# Patient Record
Sex: Female | Born: 1965 | Race: White | Hispanic: No | Marital: Single | Smoking: Never smoker
Health system: Southern US, Community
[De-identification: ages and names within clinical notes are randomized; demographics above are authoritative.]

## PROBLEM LIST (undated history)

## (undated) DIAGNOSIS — F32A Depression, unspecified: Secondary | ICD-10-CM

## (undated) DIAGNOSIS — Z9989 Dependence on other enabling machines and devices: Secondary | ICD-10-CM

## (undated) DIAGNOSIS — Z87442 Personal history of urinary calculi: Secondary | ICD-10-CM

## (undated) DIAGNOSIS — J189 Pneumonia, unspecified organism: Secondary | ICD-10-CM

## (undated) DIAGNOSIS — I639 Cerebral infarction, unspecified: Secondary | ICD-10-CM

## (undated) DIAGNOSIS — I10 Essential (primary) hypertension: Secondary | ICD-10-CM

## (undated) DIAGNOSIS — Z973 Presence of spectacles and contact lenses: Secondary | ICD-10-CM

## (undated) DIAGNOSIS — E119 Type 2 diabetes mellitus without complications: Secondary | ICD-10-CM

## (undated) HISTORY — PX: ABDOMINAL HYSTERECTOMY: SHX81

## (undated) HISTORY — PX: HERNIA REPAIR: SHX51

---

## 2011-07-25 DIAGNOSIS — C569 Malignant neoplasm of unspecified ovary: Secondary | ICD-10-CM

## 2011-07-25 HISTORY — DX: Malignant neoplasm of unspecified ovary: C56.9

## 2021-08-09 ENCOUNTER — Encounter: Payer: Self-pay | Admitting: *Deleted

## 2021-08-09 NOTE — Progress Notes (Signed)
Received a referral from PCP in the area for this patient needing to transfer care for Ovarian Cancer. The referring office has no oncology medical records.   Per PCP the patient was seen at [facility] in [place]. Called [phone] and left message requesting medical records. They called back, but transferred me to another department where I had to leave another message. Awaiting call back so that I can schedule patient.   Oncology Nurse Navigator Documentation  Oncology Nurse Navigator Flowsheets 08/09/2021  Navigator Follow Up Date: 08/10/2021  Navigator Follow Up Reason: Other:  Navigator Location [facility]  Referral Date to RadOnc/MedOnc 08/09/2021  Navigator Encounter Type Telephone  Patient Visit Type MedOnc  Treatment Phase Active Tx  Barriers/Navigation Needs Coordination of Care  Interventions Coordination of Care  Acuity Level 2-Minimal Needs (1-2 Barriers Identified)  Coordination of Care Other  Time Spent with Patient 45

## 2021-08-10 ENCOUNTER — Encounter: Payer: Self-pay | Admitting: *Deleted

## 2021-08-10 NOTE — Progress Notes (Signed)
Called patient and left a message requesting a call back. We still need medical records and I have not made any progress with attempting this myself.   Patient returned call and she will attempt to get medical records.  Fax number given.  Oncology Nurse Navigator Documentation  Oncology Nurse Navigator Flowsheets 08/10/2021  Navigator Follow Up Date: -  Navigator Follow Up Reason: -  Navigator Location [facility]  Referral Date to RadOnc/MedOnc -  Navigator Encounter Type Telephone  Telephone Outgoing Call  Patient Visit Type MedOnc  Treatment Phase Active Tx  Barriers/Navigation Needs Coordination of Care  Interventions -  Acuity -  Coordination of Care -  Time Spent with Patient 15

## 2021-08-12 ENCOUNTER — Encounter: Payer: Self-pay | Admitting: *Deleted

## 2021-08-12 NOTE — Progress Notes (Signed)
Reached out to Orpah Greek to introduce myself as the office RN Navigator and explain our new patient process. Reviewed the reason for their referral and scheduled their new patient appointment along with labs. Provided address and directions to the office including call back phone number. Reviewed with patient any concerns they may have or any possible barriers to attending their appointment.   Informed patient about my role as a navigator and that I will meet with them prior to their New Patient appointment and more fully discuss what services I can provide. At this time patient has no further questions or needs.    Oncology Nurse Navigator Documentation  Oncology Nurse Navigator Flowsheets 08/12/2021  Navigator Follow Up Date: 08/18/2021  Navigator Follow Up Reason: New Patient Appointment  Navigator Location [facility]  Referral Date to RadOnc/MedOnc -  Navigator Encounter Type Introductory Phone Call  Telephone -  Patient Visit Type MedOnc  Treatment Phase Active Tx  Barriers/Navigation Needs Coordination of Care  Interventions Coordination of Care;Education  Acuity Level 2-Minimal Needs (1-2 Barriers Identified)  Coordination of Care Appts  Education Method Verbal  Time Spent with Patient 45

## 2021-08-18 ENCOUNTER — Inpatient Hospital Stay (HOSPITAL_BASED_OUTPATIENT_CLINIC_OR_DEPARTMENT_OTHER): Payer: BC Managed Care – PPO | Admitting: Hematology & Oncology

## 2021-08-18 ENCOUNTER — Inpatient Hospital Stay: Payer: BC Managed Care – PPO

## 2021-08-18 ENCOUNTER — Telehealth: Payer: Self-pay | Admitting: *Deleted

## 2021-08-18 ENCOUNTER — Other Ambulatory Visit: Payer: Self-pay

## 2021-08-18 ENCOUNTER — Inpatient Hospital Stay: Payer: BC Managed Care – PPO | Attending: Hematology & Oncology

## 2021-08-18 ENCOUNTER — Encounter: Payer: Self-pay | Admitting: *Deleted

## 2021-08-18 ENCOUNTER — Encounter: Payer: Self-pay | Admitting: Hematology & Oncology

## 2021-08-18 VITALS — BP 156/83 | HR 83 | Temp 98.4°F | Resp 17 | Wt 146.0 lb

## 2021-08-18 DIAGNOSIS — C541 Malignant neoplasm of endometrium: Secondary | ICD-10-CM

## 2021-08-18 DIAGNOSIS — T451X5A Adverse effect of antineoplastic and immunosuppressive drugs, initial encounter: Secondary | ICD-10-CM | POA: Insufficient documentation

## 2021-08-18 DIAGNOSIS — I82501 Chronic embolism and thrombosis of unspecified deep veins of right lower extremity: Secondary | ICD-10-CM | POA: Insufficient documentation

## 2021-08-18 DIAGNOSIS — Z7901 Long term (current) use of anticoagulants: Secondary | ICD-10-CM | POA: Insufficient documentation

## 2021-08-18 DIAGNOSIS — D61810 Antineoplastic chemotherapy induced pancytopenia: Secondary | ICD-10-CM | POA: Diagnosis not present

## 2021-08-18 DIAGNOSIS — Z86718 Personal history of other venous thrombosis and embolism: Secondary | ICD-10-CM | POA: Diagnosis not present

## 2021-08-18 DIAGNOSIS — D631 Anemia in chronic kidney disease: Secondary | ICD-10-CM | POA: Insufficient documentation

## 2021-08-18 DIAGNOSIS — N19 Unspecified kidney failure: Secondary | ICD-10-CM | POA: Insufficient documentation

## 2021-08-18 DIAGNOSIS — C530 Malignant neoplasm of endocervix: Secondary | ICD-10-CM

## 2021-08-18 DIAGNOSIS — Z79811 Long term (current) use of aromatase inhibitors: Secondary | ICD-10-CM | POA: Insufficient documentation

## 2021-08-18 DIAGNOSIS — N189 Chronic kidney disease, unspecified: Secondary | ICD-10-CM

## 2021-08-18 DIAGNOSIS — Z79899 Other long term (current) drug therapy: Secondary | ICD-10-CM | POA: Diagnosis not present

## 2021-08-18 DIAGNOSIS — Z923 Personal history of irradiation: Secondary | ICD-10-CM | POA: Insufficient documentation

## 2021-08-18 DIAGNOSIS — Z7189 Other specified counseling: Secondary | ICD-10-CM

## 2021-08-18 HISTORY — DX: Malignant neoplasm of endometrium: C54.1

## 2021-08-18 HISTORY — DX: Chronic embolism and thrombosis of unspecified deep veins of right lower extremity: I82.501

## 2021-08-18 HISTORY — DX: Chronic kidney disease, unspecified: N18.9

## 2021-08-18 HISTORY — DX: Other specified counseling: Z71.89

## 2021-08-18 LAB — CBC WITH DIFFERENTIAL (CANCER CENTER ONLY)
Abs Immature Granulocytes: 0.03 10*3/uL (ref 0.00–0.07)
Basophils Absolute: 0 10*3/uL (ref 0.0–0.1)
Basophils Relative: 0 %
Eosinophils Absolute: 0.2 10*3/uL (ref 0.0–0.5)
Eosinophils Relative: 4 %
HCT: 22 % — ABNORMAL LOW (ref 36.0–46.0)
Hemoglobin: 6.9 g/dL — CL (ref 12.0–15.0)
Immature Granulocytes: 1 %
Lymphocytes Relative: 15 %
Lymphs Abs: 0.7 10*3/uL (ref 0.7–4.0)
MCH: 27.9 pg (ref 26.0–34.0)
MCHC: 31.4 g/dL (ref 30.0–36.0)
MCV: 89.1 fL (ref 80.0–100.0)
Monocytes Absolute: 0.3 10*3/uL (ref 0.1–1.0)
Monocytes Relative: 7 %
Neutro Abs: 3.3 10*3/uL (ref 1.7–7.7)
Neutrophils Relative %: 73 %
Platelet Count: 183 10*3/uL (ref 150–400)
RBC: 2.47 MIL/uL — ABNORMAL LOW (ref 3.87–5.11)
RDW: 17.4 % — ABNORMAL HIGH (ref 11.5–15.5)
WBC Count: 4.5 10*3/uL (ref 4.0–10.5)
nRBC: 0 % (ref 0.0–0.2)

## 2021-08-18 LAB — CMP (CANCER CENTER ONLY)
ALT: <5 U/L (ref 0–44)
AST: 7 U/L — ABNORMAL LOW (ref 15–41)
Albumin: 4 g/dL (ref 3.5–5.0)
Alkaline Phosphatase: 61 U/L (ref 38–126)
Anion gap: 14 (ref 5–15)
BUN: 83 mg/dL — ABNORMAL HIGH (ref 6–20)
CO2: 18 mmol/L — ABNORMAL LOW (ref 22–32)
Calcium: 10 mg/dL (ref 8.9–10.3)
Chloride: 107 mmol/L (ref 98–111)
Creatinine: 5.75 mg/dL (ref 0.44–1.00)
GFR, Estimated: 8 mL/min — ABNORMAL LOW (ref >60)
Glucose, Bld: 171 mg/dL — ABNORMAL HIGH (ref 70–99)
Potassium: 4.4 mmol/L (ref 3.5–5.1)
Sodium: 139 mmol/L (ref 135–145)
Total Bilirubin: 0.3 mg/dL (ref 0.3–1.2)
Total Protein: 7.8 g/dL (ref 6.5–8.1)

## 2021-08-18 LAB — ABO/RH: ABO/RH(D): A POS

## 2021-08-18 LAB — PREPARE RBC (CROSSMATCH)

## 2021-08-18 LAB — SAMPLE TO BLOOD BANK

## 2021-08-18 LAB — PREALBUMIN: Prealbumin: 19.1 mg/dL (ref 18–38)

## 2021-08-18 NOTE — Progress Notes (Signed)
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  HPI: Jaime Gomez is an incredibly nice 56 year old white female.  She comes in with her sister.  She is originally from [facility]  She has lived there all of her life outside of a few years when she moved to [place].  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  It looks like she may have been diagnosed with ovarian/uterine cancer back in 2012.  She underwent neoadjuvant chemotherapy with Taxol.  She had a reaction to the Taxol and then was switched over to Doxil/carboplatinum.  She underwent total hysterectomy with lymph node dissection.  She received some postop radiation to the uterus and para-aortic region.  Unfortunately, she had a quick recurrence.  This was also in 2012.  She was treated with gemcitabine/carboplatinum.  She actually received this protocol for about 4 years.  She was then placed on 2 Avastin in June 2018.  She was then treated with Alimta.  This was discontinued after 2 cycles because of severe cytopenia.  Para she was then given a trial of Lynparza.  Again she had problems with nausea and pancytopenia.  In August 2019, she was then treated with Keytruda.  She received 7 cycles and then was found to have progressive disease.  She then was treated with topotecan.  She received topotecan starting in December 2019.  She got 5 cycles.  Then, she received Piqray.  She received this until December 2020.  She had severe hyperglycemia.  She subsequently stopped therapy.  She then was treated again with carboplatinum.  She had progressive disease that was noted in April 2021.  Her CA-125 increased.  She then had some thoracic radiation therapy.  She was then placed on Abraxane.  She had 4 cycles that was completed on October 2021.  She was subsequently has been placed on  Afinitor.  She also had letrozole added.  She had a response with her CA-125 going down.  She I think her last scans back in the October.  She subsequently has moved to [facility].  She has had some mild renal insufficiency in the past.  This clearly is worse now.  Today, her hemoglobin is 6.9.  Her creatinine is 5.9.  Her family doctor in [place] is trying to find her a nephrologist.  I think that she is going to see a urologist.  She actually looks a lot better than I would have thought.  Overall, she is lost about 100 pounds in about a year or so.  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  She has a blood clot in her right leg.  She is on Eliquis for this.  She did have a IVC filter placed.  She is having issues with pain.  She is only on oxycodone.  I think she is  Need something that is time-released.  We will see about getting her on a Duragesic patch.  She seems to be tolerating the Afinitor/letrozole fairly well.  [name] we are going to ve to do our set of scans to see exactly what is going on.  She seems to be eating okay.  There is no nausea or vomiting.  She has had no cough or shortness of breath.  There is no bleeding.  She is still making urine.  She is having no problems with diarrhea.  She does not smoke.  She does not drink.  Overall, I would have to say that her performance status is probably ECOG 1-2.  No past medical history on file.:   Current Outpatient Medications:    apixaban (ELIQUIS) 2.5 MG TABS tablet, Take 2.5 mg by mouth 2 (two) times daily., Disp: , Rfl:    atorvastatin (LIPITOR) 10 MG tablet, Take 10 mg by mouth daily at 6 (six) AM., Disp: , Rfl:    carvedilol (COREG) 25 MG tablet, Take 25 mg by mouth daily at 6 (six) AM., Disp: , Rfl:    everolimus (AFINITOR) 10 MG tablet, Take 10 mg by mouth daily at 6 (six) AM., Disp: , Rfl:    folic acid (FOLVITE) 1 MG tablet, Take 1 mg by mouth daily at 6 (six) AM., Disp:  , Rfl:    gabapentin (NEURONTIN) 100 MG capsule, Take 100 mg by mouth 2 (two) times daily., Disp: , Rfl:    insulin aspart protamine - aspart (NOVOLOG MIX 70/30 FLEXPEN) (70-30) 100 UNIT/ML FlexPen, Inject 20 Units into the skin as directed. Per meal, Disp: , Rfl:    insulin detemir (LEVEMIR) 100 UNIT/ML injection, Inject 20 Units into the skin 2 (two) times daily., Disp: , Rfl:    letrozole (FEMARA) 2.5 MG tablet, Take 2.5 mg by mouth daily at 6 (six) AM., Disp: , Rfl:    nitrofurantoin, macrocrystal-monohydrate, (MACROBID) 100 MG capsule, Take 100 mg by mouth 2 (two) times daily., Disp: , Rfl:    ondansetron (ZOFRAN) 8 MG tablet, Take 8 mg by mouth every 8 (eight) hours as needed., Disp: , Rfl:    oxycodone (OXY-IR) 5 MG capsule, Take 5 mg by mouth every 4 (four) hours as needed., Disp: , Rfl:    pantoprazole (PROTONIX) 40 MG tablet, Take 40 mg by mouth in the morning., Disp: , Rfl:    pravastatin (PRAVACHOL) 80 MG tablet, Take 80 mg by mouth daily at 6 (six) AM., Disp: , Rfl:    Insulin Aspart (NOVOLOG FLEXPEN [place]), Inject 20 Units into the skin as directed. Per meal (Patient not taking: Reported on 08/18/2021), Disp: , Rfl: :  :   Allergies  Allergen Reactions   Taxotere [Docetaxel]     Throat swoll  :  No family history on file.:   Social History   Socioeconomic History   Marital status: Single    Spouse name: Not on file   Number of children: Not on file   Years of education: Not on file   Highest education level: Not on file  Occupational History   Not on file  Tobacco Use   Smoking status: Never   Smokeless tobacco: Never  Substance and Sexual Activity   Alcohol use: Not on file   Drug use: Not on file   Sexual activity: Not on file  Other Topics Concern   Not on file  Social History Narrative   Not on file   Social Determinants of Health   Financial Resource Strain: Not on file  Food Insecurity: Not on file  Transportation Needs: Not on file  Physical  Activity: Not on file  Stress: Not on file  Social Connections: Not on file  Intimate Partner Violence: Not on file  :  Review of Systems  Constitutional:  Positive for malaise/fatigue and weight loss.  HENT: Negative.    Eyes: Negative.   Respiratory:  Positive for shortness of breath.   Cardiovascular:  Positive  for palpitations and leg swelling.  Gastrointestinal:  Positive for constipation and nausea.  Genitourinary:  Positive for dysuria.  Musculoskeletal:  Positive for back pain, joint pain and myalgias.  Skin: Negative.   Neurological:  Positive for focal weakness.  Endo/Heme/Allergies: Negative.   Psychiatric/Behavioral: Negative.      Exam: @IPVITALS @ Physical Exam Vitals reviewed.  HENT:     Head: Normocephalic and atraumatic.  Eyes:     Pupils: Pupils are equal, round, and reactive to light.  Cardiovascular:     Rate and Rhythm: Normal rate and regular rhythm.     Heart sounds: Normal heart sounds.  Pulmonary:     Effort: Pulmonary effort is normal.     Breath sounds: Normal breath sounds.  Abdominal:     General: Bowel sounds are normal.     Palpations: Abdomen is soft.  Musculoskeletal:        General: No tenderness or deformity. Normal range of motion.     Cervical back: Normal range of motion.  Lymphadenopathy:     Cervical: No cervical adenopathy.  Skin:    General: Skin is warm and dry.     Findings: No erythema or rash.  Neurological:     Mental Status: She is alert and oriented to person, place, and time.  Psychiatric:        Behavior: Behavior normal.        Thought Content: Thought content normal.        Judgment: Judgment normal.      Recent Labs    08/18/21 1046  WBC 4.5  HGB 6.9*  HCT 22.0*  PLT 183    Recent Labs    08/18/21 1046  NA 139  K 4.4  CL 107  CO2 18*  GLUCOSE 171*  BUN 83*  CREATININE 5.75*  CALCIUM 10.0    Blood smear review: None  Pathology: None    Assessment and Plan: Ms. Cronk is a very  charming 56 year old white female.  She is from [facility].  She just moved here from [facility].  She has been here about 4 weeks.  We clearly have a lot going on.  The main problem right now is her renal failure.  She is going to have to see nephrology.  She is still making urine.  She is not hyperkalemic.  As such, I would like to hope that she will not need dialysis.  However, this is certainly a possibility.  She probably needs to have ultrasound of her kidneys done to see if she does have hydronephrosis.  She has had stents in the past.  There apparently were taken out because of issues with leakage and pain.  I think she sees a urologist next week.  She is clearly going need to have a transfusion.  I suspect her erythropoietin levels can be incredibly low.  I hate to say it but we probably cannot give her ESA because of the thromboembolic disease.  She does have the blood clot in the right leg.  She has a filter in.  I have worried that the filter might be filling up with blood clot.  I do think we will have to get a Doppler of her right leg to see exactly what might be going on.  She is currently on the Afinitor/letrozole.  She does have a CT scan or PET scan to see exactly what is going on there.  It might be hard to make a good assessment since we do not  have the scans from [facility].  She has been on most every therapy that there is for ovarian/uterine cancer.  She just wants to make sure her quality of life is good.  Again we will see about putting her on a Duragesic patch to see if this may help and supplement the oxycodone that she is on.  I must say that this is very complicated.  She does look quite good despite all of the issues.  She obviously has very good help with her sister.  I must say that I had a wonderful time talking with she and her sister.  It was a whole lot of fun talking about [facility] and all the issues with [employer].  Again we will transfuse her tomorrow.  We  will try to get scans on her in the next week or so and then figure out how we need to follow along.  I really think that the most important aspect is going to be the kidney failure right now.

## 2021-08-18 NOTE — Progress Notes (Signed)
Initial RN Navigator Patient Visit  Name: Jaime Gomez Date of Referral : 08/09/2021 Diagnosis: previously diagnosed and treated ovarian cancer  Patient new to the area from [facility]. She is here to establish care.  Patient completed visit with Dr. Marin Olp.  She will need to come back tomorrow for transfusion.   Patient will need scans to restage. Will wait for insurance authorization and schedule these.   Patient understands all follow up procedures and expectations. They have my number to reach out for any further clarification or additional needs.    Oncology Nurse Navigator Documentation  Oncology Nurse Navigator Flowsheets 08/18/2021  Navigator Follow Up Date: 08/19/2021  Navigator Follow Up Reason: Symptom Management  Navigator Location [facility]  Referral Date to RadOnc/MedOnc -  Navigator Encounter Type Initial MedOnc  Telephone -  Patient Visit Type MedOnc  Treatment Phase Active Tx  Barriers/Navigation Needs Coordination of Care  Interventions Education  Acuity Level 2-Minimal Needs (1-2 Barriers Identified)  Coordination of Care -  Education Method -  Support Groups/Services Friends and Family  Time Spent with Patient 30

## 2021-08-18 NOTE — Telephone Encounter (Signed)
Dr. Marin Olp notified of creat-5.75.  No new orders received at this time.

## 2021-08-18 NOTE — Telephone Encounter (Signed)
Dr. Marin Olp notified of HGB-6.9.  No new orders received at this time.

## 2021-08-19 ENCOUNTER — Ambulatory Visit (HOSPITAL_BASED_OUTPATIENT_CLINIC_OR_DEPARTMENT_OTHER)
Admission: RE | Admit: 2021-08-19 | Discharge: 2021-08-19 | Disposition: A | Discharge status: Home / Self Care | Payer: BC Managed Care – PPO | Source: Ambulatory Visit | Attending: Hematology & Oncology | Admitting: Hematology & Oncology

## 2021-08-19 ENCOUNTER — Other Ambulatory Visit: Payer: BC Managed Care – PPO

## 2021-08-19 ENCOUNTER — Other Ambulatory Visit: Payer: Self-pay | Admitting: *Deleted

## 2021-08-19 ENCOUNTER — Encounter: Payer: Self-pay | Admitting: *Deleted

## 2021-08-19 ENCOUNTER — Inpatient Hospital Stay: Payer: BC Managed Care – PPO

## 2021-08-19 ENCOUNTER — Other Ambulatory Visit: Payer: Self-pay | Admitting: Hematology & Oncology

## 2021-08-19 DIAGNOSIS — C541 Malignant neoplasm of endometrium: Secondary | ICD-10-CM | POA: Insufficient documentation

## 2021-08-19 DIAGNOSIS — C530 Malignant neoplasm of endocervix: Secondary | ICD-10-CM | POA: Insufficient documentation

## 2021-08-19 DIAGNOSIS — N189 Chronic kidney disease, unspecified: Secondary | ICD-10-CM | POA: Insufficient documentation

## 2021-08-19 DIAGNOSIS — D631 Anemia in chronic kidney disease: Secondary | ICD-10-CM | POA: Diagnosis present

## 2021-08-19 DIAGNOSIS — Z95828 Presence of other vascular implants and grafts: Secondary | ICD-10-CM

## 2021-08-19 LAB — CA 125: Cancer Antigen (CA) 125: 172 U/mL — ABNORMAL HIGH (ref 0.0–38.1)

## 2021-08-19 MED ORDER — DIPHENHYDRAMINE HCL 25 MG PO CAPS
25.0000 mg | ORAL_CAPSULE | Freq: Once | ORAL | Status: AC
  Administered 2021-08-19: 25 mg via ORAL
  Filled 2021-08-19: qty 1

## 2021-08-19 MED ORDER — SODIUM CHLORIDE 0.9% IV SOLUTION
250.0000 mL | Freq: Once | INTRAVENOUS | Status: AC
  Administered 2021-08-19: 250 mL via INTRAVENOUS

## 2021-08-19 MED ORDER — ACETAMINOPHEN 325 MG PO TABS
650.0000 mg | ORAL_TABLET | Freq: Once | ORAL | Status: AC
  Administered 2021-08-19: 650 mg via ORAL
  Filled 2021-08-19: qty 2

## 2021-08-19 NOTE — Progress Notes (Signed)
Met with patient in infusion. Gave her my business card with my contact info. Also reviewed my role and encouraged her to reach out as needed. She has already been scheduled for her PET on 08/24/2021. ° °Her port has no blood return and a CXR  this morning showed the tip to be in the L brachycephalic vein near the junction with the SVC. Spoke to Dr Ennever about ordering a dye study to confirm patency and placement. He agreed and order placed. Spoke with patient about study. She is aware that IR will be calling to schedule appointment.  ° °Oncology Nurse Navigator Documentation ° °Oncology Nurse Navigator Flowsheets 08/19/2021  °Navigator Follow Up Date: 08/24/2021  °Navigator Follow Up Reason: Scan Review  °Navigator Location [facility]  °Referral Date to RadOnc/MedOnc -  °Navigator Encounter Type Treatment  °Telephone -  °Patient Visit Type MedOnc  °Treatment Phase Active Tx  °Barriers/Navigation Needs Coordination of Care  °Interventions Psycho-Social Support  °Acuity Level 2-Minimal Needs (1-2 Barriers Identified)  °Coordination of Care -  °Education Method Verbal  °Support Groups/Services Friends and Family  °Time Spent with Patient 30  °  °

## 2021-08-19 NOTE — Progress Notes (Signed)
Pt requesting to start PIV instead of using port. PIV started per pt request

## 2021-08-19 NOTE — Patient Instructions (Signed)
Blood Transfusion, Adult, Care After This sheet gives you information about how to care for yourself after your procedure. Your doctor may also give you more specific instructions. If you have problems or questions, contact your doctor. What can I expect after the procedure? After the procedure, it is common to have: Bruising and soreness at the IV site. A headache. Follow these instructions at home: Insertion site care   Follow instructions from your doctor about how to take care of your insertion site. This is where an IV tube was put into your vein. Make sure you: Wash your hands with soap and water before and after you change your bandage (dressing). If you cannot use soap and water, use hand sanitizer. Change your bandage as told by your doctor. Check your insertion site every day for signs of infection. Check for: Redness, swelling, or pain. Bleeding from the site. Warmth. Pus or a bad smell. General instructions Take over-the-counter and prescription medicines only as told by your doctor. Rest as told by your doctor. Go back to your normal activities as told by your doctor. Keep all follow-up visits as told by your doctor. This is important. Contact a doctor if: You have itching or red, swollen areas of skin (hives). You feel worried or nervous (anxious). You feel weak after doing your normal activities. You have redness, swelling, warmth, or pain around the insertion site. You have blood coming from the insertion site, and the blood does not stop with pressure. You have pus or a bad smell coming from the insertion site. Get help right away if: You have signs of a serious reaction. This may be coming from an allergy or the body's defense system (immune system). Signs include: Trouble breathing or shortness of breath. Swelling of the face or feeling warm (flushed). Fever or chills. Head, chest, or back pain. Dark pee (urine) or blood in the pee. Widespread rash. Fast  heartbeat. Feeling dizzy or light-headed. You may receive your blood transfusion in an outpatient setting. If so, you will be told whom to contact to report any reactions. These symptoms may be an emergency. Do not wait to see if the symptoms will go away. Get medical help right away. Call your local emergency services (911 in the [place] Do not drive yourself to the hospital. Summary Bruising and soreness at the IV site are common. Check your insertion site every day for signs of infection. Rest as told by your doctor. Go back to your normal activities as told by your doctor. Get help right away if you have signs of a serious reaction. This information is not intended to replace advice given to you by your health care provider. Make sure you discuss any questions you have with your health care provider. Document Revised: 11/04/2020 Document Reviewed: 01/02/2019 Elsevier Patient Education  2022 Elsevier Inc.  

## 2021-08-22 ENCOUNTER — Other Ambulatory Visit: Payer: Self-pay

## 2021-08-22 LAB — BPAM RBC
Blood Product Expiration Date: 202302182359
Blood Product Expiration Date: 202302182359
ISSUE DATE / TIME: 202301270827
ISSUE DATE / TIME: 202301270827
Unit Type and Rh: 600
Unit Type and Rh: 600

## 2021-08-22 LAB — TYPE AND SCREEN
ABO/RH(D): A POS
Antibody Screen: POSITIVE
DAT, IgG: NEGATIVE
Donor AG Type: NEGATIVE
Donor AG Type: NEGATIVE
PT AG Type: NEGATIVE
Unit division: 0
Unit division: 0

## 2021-08-22 MED ORDER — OXYCODONE HCL 5 MG PO CAPS: 5.0000 mg | ORAL_CAPSULE | ORAL | 0 refills | Status: DC | PRN

## 2021-08-23 ENCOUNTER — Other Ambulatory Visit: Payer: Self-pay | Admitting: Hematology & Oncology

## 2021-08-23 ENCOUNTER — Other Ambulatory Visit (HOSPITAL_COMMUNITY): Payer: Self-pay | Admitting: Interventional Radiology

## 2021-08-23 ENCOUNTER — Other Ambulatory Visit: Payer: Self-pay

## 2021-08-23 ENCOUNTER — Ambulatory Visit (HOSPITAL_COMMUNITY)
Admission: RE | Admit: 2021-08-23 | Discharge: 2021-08-23 | Disposition: A | Discharge status: Home / Self Care | Payer: BC Managed Care – PPO | Source: Ambulatory Visit | Attending: Hematology & Oncology | Admitting: Hematology & Oncology

## 2021-08-23 DIAGNOSIS — Z95828 Presence of other vascular implants and grafts: Secondary | ICD-10-CM

## 2021-08-23 DIAGNOSIS — C7801 Secondary malignant neoplasm of right lung: Secondary | ICD-10-CM | POA: Diagnosis not present

## 2021-08-23 DIAGNOSIS — C7802 Secondary malignant neoplasm of left lung: Secondary | ICD-10-CM | POA: Diagnosis not present

## 2021-08-23 DIAGNOSIS — Z452 Encounter for adjustment and management of vascular access device: Secondary | ICD-10-CM | POA: Diagnosis not present

## 2021-08-23 DIAGNOSIS — N1330 Unspecified hydronephrosis: Secondary | ICD-10-CM | POA: Diagnosis not present

## 2021-08-23 HISTORY — PX: IR CV LINE INJECTION: IMG2294

## 2021-08-23 MED ORDER — IOHEXOL 300 MG/ML  SOLN
10.0000 mL | Freq: Once | INTRAMUSCULAR | Status: AC | PRN
  Administered 2021-08-23: 10 mL via INTRAVENOUS

## 2021-08-24 ENCOUNTER — Ambulatory Visit (HOSPITAL_COMMUNITY)
Admission: RE | Admit: 2021-08-24 | Discharge: 2021-08-24 | Disposition: A | Discharge status: Home / Self Care | Payer: BC Managed Care – PPO | Source: Ambulatory Visit | Attending: Hematology & Oncology | Admitting: Hematology & Oncology

## 2021-08-24 DIAGNOSIS — Z452 Encounter for adjustment and management of vascular access device: Secondary | ICD-10-CM | POA: Diagnosis not present

## 2021-08-24 DIAGNOSIS — C530 Malignant neoplasm of endocervix: Secondary | ICD-10-CM

## 2021-08-24 LAB — GLUCOSE, CAPILLARY: Glucose-Capillary: 155 mg/dL — ABNORMAL HIGH (ref 70–99)

## 2021-08-24 MED ORDER — FLUDEOXYGLUCOSE F - 18 (FDG) INJECTION
6.9400 | Freq: Once | INTRAVENOUS | Status: AC | PRN
  Administered 2021-08-24: 6.94 via INTRAVENOUS

## 2021-08-25 ENCOUNTER — Encounter: Payer: Self-pay | Admitting: *Deleted

## 2021-08-25 NOTE — Progress Notes (Signed)
Reviewed PET scan results. Patient has an appointment to have port exchanged on 09/06/2021 but no follow up scheduled with [place]. Spoke to Dr Marin Olp and he wants to see her after her port revision.   Called patient and notified her that scheduling would be calling for follow up.   Oncology Nurse Navigator Documentation  Oncology Nurse Navigator Flowsheets 08/25/2021  Navigator Follow Up Date: 09/08/2021  Navigator Follow Up Reason: Follow-up Appointment  Navigator Location [facility]  Referral Date to RadOnc/MedOnc -  Navigator Encounter Type Scan Review  Telephone -  Patient Visit Type MedOnc  Treatment Phase Active Tx  Barriers/Navigation Needs Coordination of Care  Interventions Coordination of Care  Acuity Level 2-Minimal Needs (1-2 Barriers Identified)  Coordination of Care Other  Education Method -  Support Groups/Services Friends and Family  Time Spent with Patient 30

## 2021-08-27 ENCOUNTER — Telehealth (HOSPITAL_BASED_OUTPATIENT_CLINIC_OR_DEPARTMENT_OTHER): Payer: Self-pay

## 2021-09-01 ENCOUNTER — Encounter (HOSPITAL_COMMUNITY)
Admission: EM | Disposition: A | Discharge status: Home / Self Care | Payer: Self-pay | Source: Home / Self Care | Attending: Internal Medicine

## 2021-09-01 ENCOUNTER — Emergency Department (HOSPITAL_COMMUNITY): Payer: BC Managed Care – PPO

## 2021-09-01 ENCOUNTER — Emergency Department (HOSPITAL_COMMUNITY): Payer: BC Managed Care – PPO | Admitting: Anesthesiology

## 2021-09-01 ENCOUNTER — Emergency Department (HOSPITAL_BASED_OUTPATIENT_CLINIC_OR_DEPARTMENT_OTHER): Payer: BC Managed Care – PPO

## 2021-09-01 ENCOUNTER — Other Ambulatory Visit: Payer: Self-pay

## 2021-09-01 ENCOUNTER — Inpatient Hospital Stay: Admit: 2021-09-01 | Payer: BC Managed Care – PPO | Admitting: Urology

## 2021-09-01 ENCOUNTER — Inpatient Hospital Stay (HOSPITAL_BASED_OUTPATIENT_CLINIC_OR_DEPARTMENT_OTHER)
Admission: EM | Admit: 2021-09-01 | Discharge: 2021-09-05 | DRG: 660 | Disposition: A | Discharge status: Home / Self Care | Payer: BC Managed Care – PPO | Attending: Internal Medicine | Admitting: Internal Medicine

## 2021-09-01 ENCOUNTER — Encounter (HOSPITAL_BASED_OUTPATIENT_CLINIC_OR_DEPARTMENT_OTHER): Payer: Self-pay

## 2021-09-01 DIAGNOSIS — Z79811 Long term (current) use of aromatase inhibitors: Secondary | ICD-10-CM | POA: Diagnosis not present

## 2021-09-01 DIAGNOSIS — E119 Type 2 diabetes mellitus without complications: Secondary | ICD-10-CM | POA: Diagnosis not present

## 2021-09-01 DIAGNOSIS — I129 Hypertensive chronic kidney disease with stage 1 through stage 4 chronic kidney disease, or unspecified chronic kidney disease: Secondary | ICD-10-CM | POA: Diagnosis present

## 2021-09-01 DIAGNOSIS — N3041 Irradiation cystitis with hematuria: Secondary | ICD-10-CM | POA: Diagnosis present

## 2021-09-01 DIAGNOSIS — I82411 Acute embolism and thrombosis of right femoral vein: Secondary | ICD-10-CM | POA: Diagnosis present

## 2021-09-01 DIAGNOSIS — Z79899 Other long term (current) drug therapy: Secondary | ICD-10-CM | POA: Diagnosis not present

## 2021-09-01 DIAGNOSIS — Z95828 Presence of other vascular implants and grafts: Secondary | ICD-10-CM

## 2021-09-01 DIAGNOSIS — E1122 Type 2 diabetes mellitus with diabetic chronic kidney disease: Secondary | ICD-10-CM | POA: Diagnosis present

## 2021-09-01 DIAGNOSIS — R42 Dizziness and giddiness: Secondary | ICD-10-CM | POA: Diagnosis not present

## 2021-09-01 DIAGNOSIS — C7802 Secondary malignant neoplasm of left lung: Secondary | ICD-10-CM | POA: Diagnosis present

## 2021-09-01 DIAGNOSIS — C541 Malignant neoplasm of endometrium: Secondary | ICD-10-CM

## 2021-09-01 DIAGNOSIS — N10 Acute tubulo-interstitial nephritis: Secondary | ICD-10-CM

## 2021-09-01 DIAGNOSIS — D631 Anemia in chronic kidney disease: Secondary | ICD-10-CM | POA: Diagnosis present

## 2021-09-01 DIAGNOSIS — D6481 Anemia due to antineoplastic chemotherapy: Secondary | ICD-10-CM | POA: Insufficient documentation

## 2021-09-01 DIAGNOSIS — Y842 Radiological procedure and radiotherapy as the cause of abnormal reaction of the patient, or of later complication, without mention of misadventure at the time of the procedure: Secondary | ICD-10-CM | POA: Diagnosis present

## 2021-09-01 DIAGNOSIS — K3184 Gastroparesis: Secondary | ICD-10-CM | POA: Insufficient documentation

## 2021-09-01 DIAGNOSIS — N179 Acute kidney failure, unspecified: Principal | ICD-10-CM

## 2021-09-01 DIAGNOSIS — N12 Tubulo-interstitial nephritis, not specified as acute or chronic: Secondary | ICD-10-CM | POA: Diagnosis not present

## 2021-09-01 DIAGNOSIS — E875 Hyperkalemia: Secondary | ICD-10-CM | POA: Diagnosis not present

## 2021-09-01 DIAGNOSIS — I82401 Acute embolism and thrombosis of unspecified deep veins of right lower extremity: Secondary | ICD-10-CM | POA: Diagnosis not present

## 2021-09-01 DIAGNOSIS — N189 Chronic kidney disease, unspecified: Secondary | ICD-10-CM | POA: Diagnosis present

## 2021-09-01 DIAGNOSIS — N184 Chronic kidney disease, stage 4 (severe): Secondary | ICD-10-CM | POA: Diagnosis present

## 2021-09-01 DIAGNOSIS — D701 Agranulocytosis secondary to cancer chemotherapy: Secondary | ICD-10-CM

## 2021-09-01 DIAGNOSIS — E1143 Type 2 diabetes mellitus with diabetic autonomic (poly)neuropathy: Secondary | ICD-10-CM | POA: Diagnosis present

## 2021-09-01 DIAGNOSIS — Z86711 Personal history of pulmonary embolism: Secondary | ICD-10-CM | POA: Diagnosis not present

## 2021-09-01 DIAGNOSIS — Z20822 Contact with and (suspected) exposure to covid-19: Secondary | ICD-10-CM | POA: Diagnosis present

## 2021-09-01 DIAGNOSIS — R531 Weakness: Secondary | ICD-10-CM

## 2021-09-01 DIAGNOSIS — I82511 Chronic embolism and thrombosis of right femoral vein: Secondary | ICD-10-CM | POA: Diagnosis present

## 2021-09-01 DIAGNOSIS — Z86718 Personal history of other venous thrombosis and embolism: Secondary | ICD-10-CM | POA: Diagnosis not present

## 2021-09-01 DIAGNOSIS — Z888 Allergy status to other drugs, medicaments and biological substances status: Secondary | ICD-10-CM

## 2021-09-01 DIAGNOSIS — D61818 Other pancytopenia: Secondary | ICD-10-CM | POA: Diagnosis not present

## 2021-09-01 DIAGNOSIS — N136 Pyonephrosis: Secondary | ICD-10-CM | POA: Diagnosis present

## 2021-09-01 DIAGNOSIS — C7801 Secondary malignant neoplasm of right lung: Secondary | ICD-10-CM | POA: Diagnosis present

## 2021-09-01 DIAGNOSIS — Z7901 Long term (current) use of anticoagulants: Secondary | ICD-10-CM | POA: Diagnosis not present

## 2021-09-01 DIAGNOSIS — N1831 Chronic kidney disease, stage 3a: Secondary | ICD-10-CM | POA: Diagnosis present

## 2021-09-01 DIAGNOSIS — I10 Essential (primary) hypertension: Secondary | ICD-10-CM | POA: Diagnosis not present

## 2021-09-01 DIAGNOSIS — Z794 Long term (current) use of insulin: Secondary | ICD-10-CM

## 2021-09-01 DIAGNOSIS — N3090 Cystitis, unspecified without hematuria: Secondary | ICD-10-CM | POA: Diagnosis not present

## 2021-09-01 HISTORY — DX: Personal history of urinary calculi: Z87.442

## 2021-09-01 HISTORY — DX: Essential (primary) hypertension: I10

## 2021-09-01 HISTORY — DX: Type 2 diabetes mellitus without complications: E11.9

## 2021-09-01 HISTORY — PX: CYSTOSCOPY W/ URETERAL STENT PLACEMENT: SHX1429

## 2021-09-01 LAB — GLUCOSE, CAPILLARY
Glucose-Capillary: 188 mg/dL — ABNORMAL HIGH (ref 70–99)
Glucose-Capillary: 174 mg/dL — ABNORMAL HIGH (ref 70–99)
Glucose-Capillary: 145 mg/dL — ABNORMAL HIGH (ref 70–99)

## 2021-09-01 LAB — COMPREHENSIVE METABOLIC PANEL
ALT: 27 U/L (ref 0–44)
AST: 144 U/L — ABNORMAL HIGH (ref 15–41)
Albumin: 3 g/dL — ABNORMAL LOW (ref 3.5–5.0)
Alkaline Phosphatase: 84 U/L (ref 38–126)
Anion gap: 21 — ABNORMAL HIGH (ref 5–15)
BUN: 122 mg/dL — ABNORMAL HIGH (ref 6–20)
CO2: 8 mmol/L — ABNORMAL LOW (ref 22–32)
Calcium: 8.9 mg/dL (ref 8.9–10.3)
Chloride: 104 mmol/L (ref 98–111)
Creatinine, Ser: 8.45 mg/dL — ABNORMAL HIGH (ref 0.44–1.00)
GFR, Estimated: 5 mL/min — ABNORMAL LOW (ref >60)
Glucose, Bld: 217 mg/dL — ABNORMAL HIGH (ref 70–99)
Potassium: 5.1 mmol/L (ref 3.5–5.1)
Sodium: 133 mmol/L — ABNORMAL LOW (ref 135–145)
Total Bilirubin: 0.9 mg/dL (ref 0.3–1.2)
Total Protein: 7.9 g/dL (ref 6.5–8.1)

## 2021-09-01 LAB — CBC WITH DIFFERENTIAL/PLATELET
Abs Immature Granulocytes: 0.1 10*3/uL — ABNORMAL HIGH (ref 0.00–0.07)
Basophils Absolute: 0 10*3/uL (ref 0.0–0.1)
Basophils Relative: 0 %
Eosinophils Absolute: 0 10*3/uL (ref 0.0–0.5)
Eosinophils Relative: 0 %
HCT: 30.3 % — ABNORMAL LOW (ref 36.0–46.0)
Hemoglobin: 9.9 g/dL — ABNORMAL LOW (ref 12.0–15.0)
Immature Granulocytes: 2 %
Lymphocytes Relative: 3 %
Lymphs Abs: 0.2 10*3/uL — ABNORMAL LOW (ref 0.7–4.0)
MCH: 28.9 pg (ref 26.0–34.0)
MCHC: 32.7 g/dL (ref 30.0–36.0)
MCV: 88.3 fL (ref 80.0–100.0)
Monocytes Absolute: 0.1 10*3/uL (ref 0.1–1.0)
Monocytes Relative: 2 %
Neutro Abs: 5.7 10*3/uL (ref 1.7–7.7)
Neutrophils Relative %: 93 %
Platelets: 179 10*3/uL (ref 150–400)
RBC: 3.43 MIL/uL — ABNORMAL LOW (ref 3.87–5.11)
RDW: 16 % — ABNORMAL HIGH (ref 11.5–15.5)
WBC: 6.1 10*3/uL (ref 4.0–10.5)
nRBC: 0 % (ref 0.0–0.2)

## 2021-09-01 LAB — URINALYSIS, MICROSCOPIC (REFLEX)

## 2021-09-01 LAB — RESP PANEL BY RT-PCR (FLU A&B, COVID) ARPGX2
Influenza A by PCR: NEGATIVE
Influenza B by PCR: NEGATIVE
SARS Coronavirus 2 by RT PCR: NEGATIVE

## 2021-09-01 LAB — URINALYSIS, ROUTINE W REFLEX MICROSCOPIC
Bilirubin Urine: NEGATIVE
Glucose, UA: 250 mg/dL — AB
Ketones, ur: 15 mg/dL — AB
Nitrite: NEGATIVE
Protein, ur: >300 mg/dL — AB
Specific Gravity, Urine: 1.025 (ref 1.005–1.030)
pH: 6 (ref 5.0–8.0)

## 2021-09-01 LAB — CBG MONITORING, ED: Glucose-Capillary: 181 mg/dL — ABNORMAL HIGH (ref 70–99)

## 2021-09-01 SURGERY — CYSTOSCOPY, WITH RETROGRADE PYELOGRAM AND URETERAL STENT INSERTION
Anesthesia: General | Site: Ureter | Laterality: Bilateral

## 2021-09-01 MED ORDER — POLYETHYLENE GLYCOL 3350 17 G PO PACK: 17.0000 g | PACK | Freq: Every day | ORAL | Status: DC | PRN

## 2021-09-01 MED ORDER — FENTANYL CITRATE (PF) 100 MCG/2ML IJ SOLN
INTRAMUSCULAR | Status: AC
  Filled 2021-09-01: qty 2

## 2021-09-01 MED ORDER — CHLORHEXIDINE GLUCONATE CLOTH 2 % EX PADS
6.0000 | MEDICATED_PAD | Freq: Every day | CUTANEOUS | Status: DC
  Administered 2021-09-02 – 2021-09-04 (×2): 6 via TOPICAL

## 2021-09-01 MED ORDER — MIDAZOLAM HCL 5 MG/5ML IJ SOLN
INTRAMUSCULAR | Status: DC | PRN
  Administered 2021-09-01: 2 mg via INTRAVENOUS

## 2021-09-01 MED ORDER — INSULIN ASPART 100 UNIT/ML IJ SOLN
0.0000 [IU] | Freq: Three times a day (TID) | INTRAMUSCULAR | Status: DC
  Administered 2021-09-02: 1 [IU] via SUBCUTANEOUS
  Administered 2021-09-02: 2 [IU] via SUBCUTANEOUS
  Administered 2021-09-03: 1 [IU] via SUBCUTANEOUS
  Administered 2021-09-03: 2 [IU] via SUBCUTANEOUS
  Administered 2021-09-03: 3 [IU] via SUBCUTANEOUS
  Administered 2021-09-04: 1 [IU] via SUBCUTANEOUS

## 2021-09-01 MED ORDER — PROPOFOL 10 MG/ML IV BOLUS
INTRAVENOUS | Status: DC | PRN
  Administered 2021-09-01: 20 mg via INTRAVENOUS
  Administered 2021-09-01: 120 mg via INTRAVENOUS
  Administered 2021-09-01: 20 mg via INTRAVENOUS

## 2021-09-01 MED ORDER — INSULIN ASPART 100 UNIT/ML IJ SOLN
INTRAMUSCULAR | Status: AC
  Filled 2021-09-01: qty 1

## 2021-09-01 MED ORDER — FENTANYL CITRATE (PF) 100 MCG/2ML IJ SOLN
INTRAMUSCULAR | Status: DC | PRN
  Administered 2021-09-01: 25 ug via INTRAVENOUS
  Administered 2021-09-01: 50 ug via INTRAVENOUS
  Administered 2021-09-01: 25 ug via INTRAVENOUS
  Administered 2021-09-01: 50 ug via INTRAVENOUS
  Administered 2021-09-01 (×2): 25 ug via INTRAVENOUS

## 2021-09-01 MED ORDER — OXYCODONE HCL 5 MG PO TABS
5.0000 mg | ORAL_TABLET | ORAL | Status: DC | PRN
  Administered 2021-09-03: 5 mg via ORAL
  Filled 2021-09-01: qty 1

## 2021-09-01 MED ORDER — SODIUM CHLORIDE 0.9 % IV BOLUS
500.0000 mL | Freq: Once | INTRAVENOUS | Status: AC
Start: 2021-09-01 — End: 2021-09-01
  Administered 2021-09-01: 500 mL via INTRAVENOUS

## 2021-09-01 MED ORDER — EVEROLIMUS 10 MG PO TABS: 10.0000 mg | ORAL_TABLET | Freq: Every day | ORAL | Status: DC

## 2021-09-01 MED ORDER — PANTOPRAZOLE SODIUM 40 MG PO TBEC
40.0000 mg | DELAYED_RELEASE_TABLET | Freq: Every day | ORAL | Status: DC
  Administered 2021-09-02 – 2021-09-03 (×2): 40 mg via ORAL
  Filled 2021-09-01 (×3): qty 1

## 2021-09-01 MED ORDER — PRAVASTATIN SODIUM 40 MG PO TABS
80.0000 mg | ORAL_TABLET | Freq: Every day | ORAL | Status: DC
  Administered 2021-09-02 – 2021-09-04 (×3): 80 mg via ORAL
  Filled 2021-09-01 (×3): qty 2

## 2021-09-01 MED ORDER — MIDAZOLAM HCL 2 MG/2ML IJ SOLN
INTRAMUSCULAR | Status: AC
  Filled 2021-09-01: qty 2

## 2021-09-01 MED ORDER — ACETAMINOPHEN 650 MG RE SUPP: 650.0000 mg | Freq: Four times a day (QID) | RECTAL | Status: DC | PRN

## 2021-09-01 MED ORDER — INSULIN ASPART 100 UNIT/ML IJ SOLN: 0.0000 [IU] | Freq: Every day | INTRAMUSCULAR | Status: DC

## 2021-09-01 MED ORDER — ACETAMINOPHEN 10 MG/ML IV SOLN
INTRAVENOUS | Status: DC | PRN
Start: 2021-09-01 — End: 2021-09-01
  Administered 2021-09-01: 1000 mg via INTRAVENOUS

## 2021-09-01 MED ORDER — PROMETHAZINE HCL 25 MG/ML IJ SOLN: 6.2500 mg | INTRAMUSCULAR | Status: DC | PRN

## 2021-09-01 MED ORDER — LIDOCAINE 2% (20 MG/ML) 5 ML SYRINGE
INTRAMUSCULAR | Status: DC | PRN
  Administered 2021-09-01: 100 mg via INTRAVENOUS

## 2021-09-01 MED ORDER — LETROZOLE 2.5 MG PO TABS
2.5000 mg | ORAL_TABLET | Freq: Every day | ORAL | Status: DC
  Administered 2021-09-02 – 2021-09-04 (×3): 2.5 mg via ORAL
  Filled 2021-09-01 (×4): qty 1

## 2021-09-01 MED ORDER — APIXABAN 2.5 MG PO TABS
2.5000 mg | ORAL_TABLET | Freq: Two times a day (BID) | ORAL | Status: DC
  Filled 2021-09-01: qty 1

## 2021-09-01 MED ORDER — SODIUM CHLORIDE 0.9 % IV SOLN: INTRAVENOUS | Status: DC

## 2021-09-01 MED ORDER — FENTANYL CITRATE PF 50 MCG/ML IJ SOSY: 25.0000 ug | PREFILLED_SYRINGE | INTRAMUSCULAR | Status: DC | PRN

## 2021-09-01 MED ORDER — DEXAMETHASONE SODIUM PHOSPHATE 10 MG/ML IJ SOLN
INTRAMUSCULAR | Status: DC | PRN
  Administered 2021-09-01: 5 mg via INTRAVENOUS

## 2021-09-01 MED ORDER — SODIUM CHLORIDE 0.9 % IV SOLN: INTRAVENOUS | Status: AC

## 2021-09-01 MED ORDER — IOHEXOL 300 MG/ML  SOLN
INTRAMUSCULAR | Status: DC | PRN
  Administered 2021-09-01: 5 mL

## 2021-09-01 MED ORDER — INSULIN GLARGINE-YFGN 100 UNIT/ML ~~LOC~~ SOLN
5.0000 [IU] | Freq: Two times a day (BID) | SUBCUTANEOUS | Status: DC
  Administered 2021-09-01 – 2021-09-04 (×7): 5 [IU] via SUBCUTANEOUS
  Filled 2021-09-01 (×10): qty 0.05

## 2021-09-01 MED ORDER — SODIUM CHLORIDE 0.9% FLUSH
3.0000 mL | Freq: Two times a day (BID) | INTRAVENOUS | Status: DC
  Administered 2021-09-03 – 2021-09-04 (×4): 3 mL via INTRAVENOUS

## 2021-09-01 MED ORDER — CEFTRIAXONE SODIUM 1 G IJ SOLR
1.0000 g | Freq: Once | INTRAMUSCULAR | Status: AC
  Administered 2021-09-01: 1 g via INTRAVENOUS
  Filled 2021-09-01: qty 10

## 2021-09-01 MED ORDER — ACETAMINOPHEN 325 MG PO TABS: 650.0000 mg | ORAL_TABLET | Freq: Four times a day (QID) | ORAL | Status: DC | PRN

## 2021-09-01 MED ORDER — INSULIN ASPART 100 UNIT/ML IJ SOLN
5.0000 [IU] | Freq: Once | INTRAMUSCULAR | Status: AC
  Administered 2021-09-01: 5 [IU] via SUBCUTANEOUS

## 2021-09-01 MED ORDER — PHENYLEPHRINE 40 MCG/ML (10ML) SYRINGE FOR IV PUSH (FOR BLOOD PRESSURE SUPPORT)
PREFILLED_SYRINGE | INTRAVENOUS | Status: DC | PRN
  Administered 2021-09-01 (×4): 80 ug via INTRAVENOUS

## 2021-09-01 MED ORDER — ACETAMINOPHEN 10 MG/ML IV SOLN
INTRAVENOUS | Status: AC
  Filled 2021-09-01: qty 100

## 2021-09-01 MED ORDER — SODIUM CHLORIDE 0.9 % IV SOLN
1.0000 g | INTRAVENOUS | Status: DC
  Administered 2021-09-02 – 2021-09-04 (×3): 1 g via INTRAVENOUS
  Filled 2021-09-01 (×4): qty 10

## 2021-09-01 MED ORDER — CHLORHEXIDINE GLUCONATE 0.12 % MT SOLN
15.0000 mL | OROMUCOSAL | Status: AC
  Administered 2021-09-01: 15 mL via OROMUCOSAL

## 2021-09-01 MED ORDER — ONDANSETRON HCL 4 MG/2ML IJ SOLN
INTRAMUSCULAR | Status: DC | PRN
  Administered 2021-09-01: 4 mg via INTRAVENOUS

## 2021-09-01 SURGICAL SUPPLY — 18 items
BAG URO CATCHER STRL LF (MISCELLANEOUS) ×2 IMPLANT
CATH URETL OPEN 5X70 (CATHETERS) ×1 IMPLANT
CLOTH BEACON ORANGE TIMEOUT ST (SAFETY) ×2 IMPLANT
GLOVE SURG ENC TEXT LTX SZ7 (GLOVE) ×2 IMPLANT
GLOVE SURG UNDER POLY LF SZ6.5 (GLOVE) ×1 IMPLANT
GOWN STRL REUS W/TWL LRG LVL3 (GOWN DISPOSABLE) ×3 IMPLANT
GUIDEWIRE STR DUAL SENSOR (WIRE) ×2 IMPLANT
GUIDEWIRE ZIPWRE .038 STRAIGHT (WIRE) IMPLANT
IV NS IRRIG 3000ML ARTHROMATIC (IV SOLUTION) ×1 IMPLANT
MANIFOLD NEPTUNE II (INSTRUMENTS) ×2 IMPLANT
PACK CYSTO (CUSTOM PROCEDURE TRAY) ×2 IMPLANT
STENT POLARIS 5FRX24 (STENTS) IMPLANT
SYR 10ML LL (SYRINGE) ×2 IMPLANT
SYR TOOMEY IRRIG 70ML (MISCELLANEOUS) ×2
SYRINGE TOOMEY IRRIG 70ML (MISCELLANEOUS) IMPLANT
TUBING CONNECTING 10 (TUBING) ×2 IMPLANT
TUBING UROLOGY SET (TUBING) IMPLANT
WATER STERILE IRR 1000ML POUR (IV SOLUTION) ×1 IMPLANT

## 2021-09-01 NOTE — Progress Notes (Signed)
Pharmacy notified of need for med rec secondary to completion of pts surgery.

## 2021-09-01 NOTE — Anesthesia Preprocedure Evaluation (Addendum)
Anesthesia Evaluation  Patient identified by MRN, date of birth, ID band Patient awake    Reviewed: Allergy & Precautions, NPO status , Patient's Chart, lab work & pertinent test results  Airway Mallampati: II  TM Distance: >3 FB Neck ROM: Full    Dental  (+) Teeth Intact, Dental Advisory Given   Pulmonary neg pulmonary ROS,    Pulmonary exam normal breath sounds clear to auscultation       Cardiovascular hypertension, Pt. on medications and Pt. 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   Neuro/Psych negative neurological ROS  negative psych ROS   GI/Hepatic negative GI ROS, Neg liver ROS,   Endo/Other  diabetes, Poorly Controlled, Type 2, Insulin DependentFS 217 in ED  Renal/GU ARFRenal disease (R hydronephrosis )Known CKD  Now with ARF and bilateral hydronephrosis- denies N/V  Female GU complaint endometrial cancer stage 4    Musculoskeletal negative musculoskeletal ROS (+)   Abdominal   Peds negative pediatric ROS (+)  Hematology  (+) Blood dyscrasia, anemia , Eliquis therapy- last dose   Anesthesia Other Findings Chronic oxy 5mg   Reproductive/Obstetrics negative OB ROS                         Anesthesia Physical Anesthesia Plan  ASA: 3  Anesthesia Plan: General   Post-op Pain Management: Ofirmev IV (intra-op)   Induction: Intravenous  PONV Risk Score and Plan: 4 or greater and Treatment may vary due to age or medical condition, Ondansetron and Dexamethasone  Airway Management Planned: LMA  Additional Equipment: None  Intra-op Plan:   Post-operative Plan: Extubation in OR  Informed Consent: I have reviewed the patients History and Physical, chart, labs and discussed the procedure including the risks, benefits and alternatives for  the proposed anesthesia with the patient or authorized representative who has indicated his/her understanding and acceptance.     Dental advisory given  Plan Discussed with: CRNA  Anesthesia Plan Comments:       Anesthesia Quick Evaluation

## 2021-09-01 NOTE — Anesthesia Procedure Notes (Signed)
Procedure Name: LMA Insertion Date/Time: 09/01/2021 6:47 PM Performed by: Raenette Rover, CRNA Pre-anesthesia Checklist: Patient identified, Emergency Drugs available, Suction available and Patient being monitored Patient Re-evaluated:Patient Re-evaluated prior to induction Oxygen Delivery Method: Circle system utilized Preoxygenation: Pre-oxygenation with 100% oxygen Induction Type: IV induction LMA: LMA inserted LMA Size: 4.0 Number of attempts: 1 Placement Confirmation: positive ETCO2 and breath sounds checked- equal and bilateral Tube secured with: Tape Dental Injury: Teeth and Oropharynx as per pre-operative assessment

## 2021-09-01 NOTE — Consult Note (Signed)
Referral MD  Reason for Referral: Profound renal failure; metastatic endometrial carcinoma  Chief Complaint  Patient presents with   Dizziness  : I just got weak.  HPI: Jaime Gomez is well-known to me.  She is a very nice 56 year old white female.  We saw for the first time about 3 weeks ago.  She moved here from [facility].  She has metastatic adenocarcinoma of the endometrium.  She was diagnosed back in 2012.  She has been on numerous chemotherapeutic agents.  When we saw her, she was on everolimus.  She also was on letrozole.    She was profoundly anemic.  She had mild renal insufficiency.  We did go ahead and transfuse her with 2 units of blood.  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  She had an IVC filter placed back in [place].  She had a PET scan that was done.  This was done on 08/24/2021.  This did show some activity in her lungs.  There is no activity down on the pelvis.  She says she still making some urine.  There is no leg swelling.  When she came to the emergency room, her BUN was 122 creatinine 8.45.  Her glucose was 217.  Her white cell count 6.1.  Hemoglobin 9.9.  Platelet count 179,000.  She had a CT scan that was done in the emergency room.  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  She had moderate right and slightly improved left hydroureteronephrosis.  She had a right kidney stone.  She has had no nausea or vomiting.  She has had no diarrhea.  She has had no fever.  There has been no obvious bleeding.  Her urine is cloudy.  There is a lot of protein in there.  There is white cells and red cells.  Hopefully, we are looking at is an infection or possibly kidney stone.  I would think that the kidneys should be able to get improve.  I did talk to her about CODE STATUS.  She has been through a lot of treatments.  She really does not have a lot of disease at least by PET scan.  She still would like to be kept alive via CPR/defibrillation/ventilator..  She  said that she would like to want to avoid hemodialysis if possible.  Overall, I would say performance status is probably ECOG 2.  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 failure 08/18/2021   Endometrial cancer, FIGO stage IVB ([place]) 08/18/2021   Goals of care, counseling/discussion 08/18/2021   Leg DVT (deep venous thromboembolism), chronic, right ([place]) 08/18/2021  :   Past Surgical History:  Procedure Laterality Date   IR CV LINE INJECTION  08/23/2021  :   Current Facility-Administered Medications:    0.9 %  sodium chloride infusion, , Intravenous, Continuous, Zackowski, Scott, MD, Last Rate: 100 mL/hr at 09/01/21 1514, New Bag at 09/01/21 1514  Current Outpatient Medications:    apixaban (ELIQUIS) 2.5 MG TABS tablet, Take 2.5 mg by mouth 2 (two) times daily., Disp: , Rfl:    atorvastatin (LIPITOR) 10 MG tablet, Take 10 mg by mouth daily at 6 (six) AM., Disp: , Rfl:    carvedilol (COREG) 25 MG tablet, Take 25 mg by mouth daily at 6 (six) AM., Disp: , Rfl:    everolimus (AFINITOR) 10 MG tablet, Take 10 mg by mouth daily at 6 (six) AM., Disp: , Rfl:    folic acid (FOLVITE) 1 MG  tablet, Take 1 mg by mouth daily at 6 (six) AM., Disp: , Rfl:    gabapentin (NEURONTIN) 100 MG capsule, Take 100 mg by mouth 2 (two) times daily., Disp: , Rfl:    Insulin Aspart (NOVOLOG FLEXPEN [place]), Inject 20 Units into the skin as directed. Per meal (Patient not taking: Reported on 08/18/2021), Disp: , Rfl:    insulin aspart protamine - aspart (NOVOLOG MIX 70/30 FLEXPEN) (70-30) 100 UNIT/ML FlexPen, Inject 20 Units into the skin as directed. Per meal, Disp: , Rfl:    insulin detemir (LEVEMIR) 100 UNIT/ML injection, Inject 20 Units into the skin 2 (two) times daily., Disp: , Rfl:    letrozole (FEMARA) 2.5 MG tablet, Take 2.5 mg by mouth daily at 6 (six) AM., Disp: , Rfl:    nitrofurantoin, macrocrystal-monohydrate, (MACROBID) 100 MG capsule, Take 100 mg by mouth 2 (two) times daily., Disp: ,  Rfl:    ondansetron (ZOFRAN) 8 MG tablet, Take 8 mg by mouth every 8 (eight) hours as needed., Disp: , Rfl:    oxycodone (OXY-IR) 5 MG capsule, Take 1 capsule (5 mg total) by mouth every 4 (four) hours as needed., Disp: 120 capsule, Rfl: 0   pantoprazole (PROTONIX) 40 MG tablet, Take 40 mg by mouth in the morning., Disp: , Rfl:    pravastatin (PRAVACHOL) 80 MG tablet, Take 80 mg by mouth daily at 6 (six) AM., Disp: , Rfl: :  :   Allergies  Allergen Reactions   Taxotere [Docetaxel]     Throat swoll  :  History reviewed. No pertinent family history.:   Social History   Socioeconomic History   Marital status: Single    Spouse name: Not on file   Number of children: Not on file   Years of education: Not on file   Highest education level: Not on file  Occupational History   Not on file  Tobacco Use   Smoking status: Never   Smokeless tobacco: Never  Vaping Use   Vaping Use: Never used  Substance and Sexual Activity   Alcohol use: Not Currently   Drug use: Never   Sexual activity: Not on file  Other Topics Concern   Not on file  Social History Narrative   Not on file   Social Determinants of Health   Financial Resource Strain: Not on file  Food Insecurity: Not on file  Transportation Needs: Not on file  Physical Activity: Not on file  Stress: Not on file  Social Connections: Not on file  Intimate Partner Violence: Not on file  :  Review of Systems  Constitutional:  Positive for malaise/fatigue.  HENT: Negative.    Eyes: Negative.   Respiratory:  Positive for shortness of breath.   Cardiovascular: Negative.   Gastrointestinal:  Positive for abdominal pain.  Genitourinary:  Positive for dysuria and frequency.  Musculoskeletal:  Positive for joint pain and myalgias.  Skin: Negative.   Neurological:  Positive for weakness.  Endo/Heme/Allergies: Negative.   Psychiatric/Behavioral: Negative.      Exam: Patient Vitals for the past 24 hrs:  BP Temp Temp src  Pulse Resp SpO2 Height Weight  09/01/21 1500 110/74 -- -- 95 16 99 % -- --  09/01/21 1423 105/66 -- -- 92 14 99 % -- --  09/01/21 1333 124/83 -- -- 94 17 97 % -- --  09/01/21 1300 100/67 -- -- 98 16 95 % -- --  09/01/21 1245 -- -- -- 100 -- 95 % -- --  09/01/21 1230 -- -- --  98 -- 95 % -- --  09/01/21 1200 -- -- -- (!) 104 12 97 % -- --  09/01/21 1130 101/65 -- -- (!) 105 13 96 % -- --  09/01/21 1100 106/70 -- -- (!) 105 13 98 % -- --  09/01/21 1028 123/83 99.4 F (37.4 C) Oral (!) 122 18 98 % -- --  09/01/21 1027 -- -- -- -- -- -- 5\' 5"  (1.651 m) 147 lb (66.7 kg)   Physical Exam Vitals reviewed.  HENT:     Head: Normocephalic and atraumatic.  Eyes:     Pupils: Pupils are equal, round, and reactive to light.  Cardiovascular:     Rate and Rhythm: Normal rate and regular rhythm.     Heart sounds: Normal heart sounds.  Pulmonary:     Effort: Pulmonary effort is normal.     Breath sounds: Normal breath sounds.  Abdominal:     General: Bowel sounds are normal.     Palpations: Abdomen is soft.  Musculoskeletal:        General: No tenderness or deformity. Normal range of motion.     Cervical back: Normal range of motion.  Lymphadenopathy:     Cervical: No cervical adenopathy.  Skin:    General: Skin is warm and dry.     Findings: No erythema or rash.  Neurological:     Mental Status: She is alert and oriented to person, place, and time.  Psychiatric:        Behavior: Behavior normal.        Thought Content: Thought content normal.        Judgment: Judgment normal.     Recent Labs    09/01/21 1044  WBC 6.1  HGB 9.9*  HCT 30.3*  PLT 179    Recent Labs    09/01/21 1044  NA 133*  K 5.1  CL 104  CO2 8*  GLUCOSE 217*  BUN 122*  CREATININE 8.45*  CALCIUM 8.9    Blood smear review: None  Pathology: None    Assessment and Plan: Ms. Dragon is a very nice 56 year old white female.  She has metastatic adenocarcinoma of the endometrium/ovary.  Of note, her CA125  back in late January was not all that bad at 172.  She clearly does not have a lot of bulky disease.  It looks like this kidney issue might be reversible.  I will know she has a kidney stone that could be doing this.  Looks like there may be an infection.  We will have to see what the urinalysis and urine culture shows.  She does not look like she is volume overloaded.  She does not have hyperkalemia.  She actually looks better than I would have thought.  I just hate that this is happened to her.  Again she does not want to have dialysis.  Again she is making urine so I would hope that dialysis will not be needed needed.  I will know she will need urology to do a cystoscopy and possible stent or stone removal.  I understand her wishes to be kept alive artificially.  She understands what this means.  We will certainly follow her in the hospital.  I know that she will get wonderful care no matter where she goes.  Lattie Haw, MD  2 Timothy 1:7

## 2021-09-01 NOTE — Anesthesia Postprocedure Evaluation (Signed)
Anesthesia Post Note  Patient: Jaime Gomez  Procedure(s) Performed: CYSTOSCOPY WITH RETROGRADE PYELOGRAM/URETERAL STENT PLACEMENT (Bilateral: Ureter)     Patient location during evaluation: PACU Anesthesia Type: General Level of consciousness: awake and alert, oriented and patient cooperative Pain management: pain level controlled Vital Signs Assessment: post-procedure vital signs reviewed and stable Respiratory status: spontaneous breathing, nonlabored ventilation and respiratory function stable Cardiovascular status: blood pressure returned to baseline and stable Postop Assessment: no apparent nausea or vomiting Anesthetic complications: no   No notable events documented.  Last Vitals:  Vitals:   09/01/21 1700 09/01/21 1935  BP: 104/66   Pulse:    Resp: 11 (P) 11  Temp:    SpO2:      Last Pain:  Vitals:   09/01/21 1820  TempSrc:   PainSc: 0-No pain                 Pervis Hocking

## 2021-09-01 NOTE — ED Notes (Signed)
Patient transported to CT 

## 2021-09-01 NOTE — H&P (Signed)
History and Physical   Jaime Gomez WCB:[id number] DOB: May 28, 1966 DOA: 09/01/2021  PCP: Darvin Neighbours, FNP   Patient coming from: Home  Chief Complaint: Dizziness  HPI: Jaime Gomez is a 56 y.o. female with medical history significant of metastatic endometrial cancer, DVT, PE, anemia, neutropenia, CKD 3-4, hypertension, gastroparesis, diabetes presenting from home with dizziness.  Patient reportedly felt lightheaded today which felt similar to episodes of low blood sugar in the past but blood sugar was normal.  Present to the ED for further evaluation.  Reports generalized weakness and decreased urine output.  Patient recently moved from [facility], has known metastatic endometrial cancer and is established with oncology here.  Has CKD 3-4.  In April creatinine was in around 3 and 2 weeks ago at oncology visit creatinine was 5.75.  Patient does have history of multiple ureteral stents placed due to history of stones and possible scarring from procedures.  Patient denies fevers, chills, chest pain, shortness of breath, abdominal pain, constipation, diarrhea, nausea, vomiting.  ED Course: Vital signs in the ED significant for heart rate in the 80s to 100s, blood pressure in the 616W to 737T systolic.  Lab work-up showed CMP with sodium 133, potassium stable at 5.1, bicarb 8 with gap of 21 and BUN 122.  Creatinine elevated to 8.45 up from 5.752 weeks ago and around 3 in April.  Glucose 217, AST elevated to 144, albumin 3.  CBC showed hemoglobin 9.9 which is improved posttransfusion from 6.92 weeks ago.  Respiratory panel for flu and COVID-negative.  Urinalysis with glucose, hemoglobin, ketones, protein, leukocytes, few bacteria.  Urine culture pending.  Chest x-ray showed no acute normality.  CT head showed no acute abnormality.  CT renal stone study showed new left emphysematous pyelitis, evidence of cystitis, stable moderate right hydronephrosis and improved to mild left hydronephrosis with small  stones present in the kidneys.  Gallstones also noted.  Urology consulted in ED and recommended left stent placement as source of AKI thought to be obstructive also recommend urinalysis and ceftriaxone with transfer to [facility] for cystoscopy and intervention.  Patient's oncologist also consulted who confirmed patient is full code but would not like to have dialysis.  Review of Systems: As per HPI otherwise all other systems reviewed and are negative.  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 failure 08/18/2021   Diabetes mellitus without complication (HCC)    Endometrial cancer, FIGO stage IVB ([place]) 08/18/2021   Goals of care, counseling/discussion 08/18/2021   History of kidney stones    Hypertension    Leg DVT (deep venous thromboembolism), chronic, right ([place]) 08/18/2021    Past Surgical History:  Procedure Laterality Date   ABDOMINAL HYSTERECTOMY     HERNIA REPAIR     IR CV LINE INJECTION  08/23/2021    Social History  reports that she has never smoked. She has never used smokeless tobacco. She reports that she does not currently use alcohol. She reports that she does not use drugs.  Allergies  Allergen Reactions   Taxotere [Docetaxel]     Throat swoll    History reviewed. No pertinent family history.  Prior to Admission medications   Medication Sig Start Date End Date Taking? Authorizing Provider  apixaban (ELIQUIS) 2.5 MG TABS tablet Take 2.5 mg by mouth 2 (two) times daily.   Yes [provider]  atorvastatin (LIPITOR) 10 MG tablet Take 10 mg by mouth daily at 6 (six) AM. 01/19/21  Yes [provider]  carvedilol (COREG) 25 MG tablet Take 25 mg by mouth daily at 6 (six) AM.   Yes [provider]  everolimus (AFINITOR) 10 MG tablet Take 10 mg by mouth daily at 6 (six) AM. 08/18/20  Yes [provider]  folic acid (FOLVITE) 1 MG tablet Take 1 mg by mouth daily at 6 (six) AM. 10/11/20  Yes [provider]  gabapentin (NEURONTIN) 100 MG capsule Take 100 mg by mouth 2 (two) times daily. 08/18/20  Yes [provider]  insulin aspart protamine - aspart (NOVOLOG MIX 70/30 FLEXPEN) (70-30) 100 UNIT/ML FlexPen Inject 20 Units into the skin as directed. Per meal 01/19/21  Yes [provider]  insulin detemir (LEVEMIR) 100 UNIT/ML injection Inject 20 Units into the skin 2 (two) times daily. 01/19/21  Yes [provider]  letrozole (FEMARA) 2.5 MG tablet Take 2.5 mg by mouth daily at 6 (six) AM. 05/12/21  Yes [provider]  nitrofurantoin, macrocrystal-monohydrate, (MACROBID) 100 MG capsule Take 100 mg by mouth 2 (two) times daily. 10/04/20  Yes [provider]  oxycodone (OXY-IR) 5 MG capsule Take 1 capsule (5 mg total) by mouth every 4 (four) hours as needed. 08/22/21  Yes [name], MD  pravastatin (PRAVACHOL) 80 MG tablet Take 80 mg by mouth daily at 6 (six) AM.   Yes [provider]  Insulin Aspart (NOVOLOG FLEXPEN [place]) Inject 20 Units into the skin as directed. Per meal Patient not taking: Reported on 08/18/2021    [provider]  ondansetron (ZOFRAN) 8 MG tablet Take 8 mg by mouth every 8 (eight) hours as needed. 08/18/20   [provider]  pantoprazole (PROTONIX) 40 MG tablet Take 40 mg by mouth in the morning. 10/15/20   [provider]    Physical Exam: Vitals:   09/01/21 2000 09/01/21 2015 09/01/21 2030 09/01/21 2108  BP: 136/90 123/80 127/86 122/80  Pulse: 91 83 82 78  Resp: 13 11 14 14   Temp:    97.6 F (36.4 C)  TempSrc:    Oral  SpO2: 100% 100% 100% 99%  Weight:      Height:        Physical Exam Constitutional:      General: She is not in acute distress.    Appearance: Normal appearance.     Comments: Drowsy post-operatively  HENT:     Head: Normocephalic and atraumatic.     Mouth/Throat:     Mouth: Mucous membranes are moist.     Pharynx: Oropharynx is clear.  Eyes:     Extraocular  Movements: Extraocular movements intact.     Pupils: Pupils are equal, round, and reactive to light.  Cardiovascular:     Rate and Rhythm: Normal rate and regular rhythm.     Pulses: Normal pulses.     Heart sounds: Normal heart sounds.  Pulmonary:     Effort: Pulmonary effort is normal. No respiratory distress.     Breath sounds: Normal breath sounds.  Abdominal:     General: Bowel sounds are normal. There is no distension.     Palpations: Abdomen is soft.     Tenderness: There is no abdominal tenderness.  Musculoskeletal:        General: No swelling or deformity.  Skin:    General: Skin is warm and dry.  Neurological:     General: No focal deficit present.     Mental Status: Mental status is at baseline.   Labs on  Admission: I have personally reviewed following labs and imaging studies  CBC: Recent Labs  Lab 09/01/21 1044  WBC 6.1  NEUTROABS 5.7  HGB 9.9*  HCT 30.3*  MCV 88.3  PLT 627    Basic Metabolic Panel: Recent Labs  Lab 09/01/21 1044  NA 133*  K 5.1  CL 104  CO2 8*  GLUCOSE 217*  BUN 122*  CREATININE 8.45*  CALCIUM 8.9    GFR: Estimated Creatinine Clearance: 6.6 mL/min (A) (by C-G formula based on SCr of 8.45 mg/dL (H)).  Liver Function Tests: Recent Labs  Lab 09/01/21 1044  AST 144*  ALT 27  ALKPHOS 84  BILITOT 0.9  PROT 7.9  ALBUMIN 3.0*    Urine analysis:    Component Value Date/Time   COLORURINE YELLOW 09/01/2021 1204   APPEARANCEUR CLOUDY (A) 09/01/2021 1204   LABSPEC 1.025 09/01/2021 1204   PHURINE 6.0 09/01/2021 1204   GLUCOSEU 250 (A) 09/01/2021 1204   HGBUR LARGE (A) 09/01/2021 1204   BILIRUBINUR NEGATIVE 09/01/2021 1204   KETONESUR 15 (A) 09/01/2021 1204   PROTEINUR >300 (A) 09/01/2021 1204   NITRITE NEGATIVE 09/01/2021 1204   LEUKOCYTESUR SMALL (A) 09/01/2021 1204    Radiological Exams on Admission: CT Head Wo Contrast  Result Date: 09/01/2021 CLINICAL DATA:  Mental status change, unknown cause, cervical cancer  EXAM: CT HEAD WITHOUT CONTRAST TECHNIQUE: Contiguous axial images were obtained from the base of the skull through the vertex without intravenous contrast. RADIATION DOSE REDUCTION: This exam was performed according to the departmental dose-optimization program which includes automated exposure control, adjustment of the mA and/or kV according to patient size and/or use of iterative reconstruction technique. COMPARISON:  None. FINDINGS: Brain: There is no acute intracranial hemorrhage, mass effect, or edema. Gray-white differentiation is preserved. There is no extra-axial fluid collection. Ventricles and sulci are within normal limits in size and configuration. Vascular: There is atherosclerotic calcification at the skull base. Skull: Calvarium is unremarkable. Sinuses/Orbits: No acute finding. Other: None. IMPRESSION: No acute intracranial abnormality. Electronically Signed   By: Macy Mis M.D.   On: 09/01/2021 12:46   DG Chest Port 1 View  Result Date: 09/01/2021 CLINICAL DATA:  Generalized weakness, lightheadedness EXAM: PORTABLE CHEST 1 VIEW COMPARISON:  08/19/2021 FINDINGS: Left chest wall port catheter is in similar position. No new consolidation or edema. Left suprahilar patchy density is unchanged. No pleural effusion. Normal heart size. IMPRESSION: No acute process in the chest.  Nodules on PET CT are not well seen. Electronically Signed   By: Macy Mis M.D.   On: 09/01/2021 12:35   DG C-Arm 1-60 Min-No Report  Result Date: 09/01/2021 Fluoroscopy was utilized by the requesting physician.  No radiographic interpretation.   CT Renal Stone Study  Result Date: 09/01/2021 CLINICAL DATA:  Kidney pain. Hematuria. History of cervical cancer. EXAM: CT ABDOMEN AND PELVIS WITHOUT CONTRAST TECHNIQUE: Multidetector CT imaging of the abdomen and pelvis was performed following the standard protocol without IV contrast. RADIATION DOSE REDUCTION: This exam was performed according to the departmental  dose-optimization program which includes automated exposure control, adjustment of the mA and/or kV according to patient size and/or use of iterative reconstruction technique. COMPARISON:  PET-CT dated August 24, 2021. FINDINGS: Lower chest: No acute abnormality. Hepatobiliary: No focal liver abnormality. Unchanged gallstone. No gallbladder wall thickening or biliary dilatation. Pancreas: Unremarkable. No pancreatic ductal dilatation or surrounding inflammatory changes. Spleen: Normal in size without focal abnormality. Adrenals/Urinary Tract: Adrenal glands are unremarkable. Unchanged moderate right and decreased now  mild left hydroureteronephrosis. New foci of air within the left renal collecting system. Unchanged punctate calculus in the posterior right kidney upper pole. No ureteral calculi. Bilateral renovascular calcifications again noted. Significantly thick-walled, decompressed bladder. Stomach/Bowel: Stomach is within normal limits. Appendix appears normal. No evidence of bowel wall thickening, distention, or inflammatory changes. Vascular/Lymphatic: Aortic atherosclerosis. Infrarenal IVC filter again noted. No enlarged abdominal or pelvic lymph nodes. Reproductive: Status post hysterectomy. No adnexal masses. Other: No free fluid or pneumoperitoneum. Prior ventral hernia repair with unchanged mild anterior abdominal diastasis. Musculoskeletal: No acute or significant osseous findings. IMPRESSION: 1. New left emphysematous pyelitis. Unchanged moderate right and decreased now mild left hydroureteronephrosis. 2. Unchanged punctate right nephrolithiasis. 3. Significantly thick-walled, decompressed bladder, consistent with cystitis. 4. Unchanged cholelithiasis. 5. Aortic Atherosclerosis (ICD10-I70.0). Electronically Signed   By: Titus Dubin M.D.   On: 09/01/2021 14:13    EKG: Independently reviewed.  Sinus tachycardia at 110 bpm.  Baseline artifact multiple leads.  Low voltage in multiple leads.   Nonspecific T wave flattening.  Assessment/Plan Principal Problem:   AKI (acute kidney injury) ([place]) Active Problems:   Endometrial cancer, FIGO stage IVB ([place])   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 failure   Essential (primary) hypertension   Personal history of other venous thrombosis and embolism   Personal history of pulmonary embolism   Type 2 diabetes mellitus without complications ([place])   AKI on CKD > Creatinine elevated to 8.45 from 5.752 weeks ago and in the threes in April.  Unclear current stable baseline. > Suspected etiology is obstructive considering her history.  History of multiple ureteral stents secondary to stones and likely scarring from recurrent procedures. > Does have continued bilateral hydronephrosis on CT.  Oncology confirmed per EDP that current medications are not effective to be nephrotoxic. > Urology consulted in the ED and has taken patient for intervention already this evening.  They were able to place a stent on the right but unable to have stent placement on the left.  They have consulted IR for left nephrostomy. - Monitor on telemetry - Appreciate urology recommendations - Appreciate urology consult to IR for left nephrostomy placement - Will likely benefit from nephrology consult in the morning, patient currently states she would not want dialysis trial. - Continue with IV fluids - Avoid nephrotoxic agents - Trend renal function and electrolytes  Cystitis Left emphysematous pyelitis > CT showing evidence of cystitis and pyelitis.  Patient started on ceftriaxone in the ED.  No leukocytosis but does have urinalysis with leukocytes and bacteria. - Continue with ceftriaxone - Follow-up urine cultures - Trend fever curve and white count  Metastatic endometrial cancer > Known history of this.  Following with oncology here who has been consulted and is following while patient is admitted. > PET scan positive at lungs recently. - Continue home letrozole and  everolimus  Hypertension - Hold home carvedilol as blood pressure is low normal.  Diabetes > On 20 units long-acting insulin twice daily and 20 units 7030 with meals per chart - 5 units twice daily long-acting given significant AKI - SSI with HS coverage  Anemia > Hemoglobin currently stable at 9.9. > History of anemia likely secondary to renal failure versus cancer or chemotherapy. > Recently had 2 unit transfusion when hemoglobin found to be 6.9 at oncology visit 2 weeks ago. - Trend CBC  History of DVT History of PE - Continue home Eliquis, hold evening dose periprocedure  DVT prophylaxis: Eliquis Code Status:   Full Family Communication:  Admission.  Family is up-to-date with patient's status and plan of care as they had just recently left patient's room prior to my arrival. Disposition Plan:   Patient is from:  Home  Anticipated DC to:  Home  Anticipated DC date:  2 to 4 days  Anticipated DC barriers: None  Consults called:  Urology consulted by EDP.  IR consulted by urology.  Will likely benefit from nephrology consult as well. Admission status:  Inpatient, telemetry  Severity of Illness: The appropriate patient status for this patient is INPATIENT. Inpatient status is judged to be reasonable and necessary in order to provide the required intensity of service to ensure the patient's safety. The patient's presenting symptoms, physical exam findings, and initial radiographic and laboratory data in the context of their chronic comorbidities is felt to place them at high risk for further clinical deterioration. Furthermore, it is not anticipated that the patient will be medically stable for discharge from the hospital within 2 midnights of admission.   * I certify that at the point of admission it is my clinical judgment that the patient will require inpatient hospital care spanning beyond 2 midnights from the point of admission due to high intensity of service, high risk for  further deterioration and high frequency of surveillance required.Marcelyn Bruins MD [facility]  How to contact the [facility] Attending or Consulting provider [place] or covering provider during after hours [place], for this patient?   Check the care team in [facility] and look for a) attending/consulting [facility] provider listed and b) the [facility] team listed Log into [email or web address] and use [place]'s universal password to access. If you do not have the password, please contact the hospital operator. Locate the [facility] provider you are looking for under [facility] and page to a number that you can be directly reached. If you still have difficulty reaching the provider, please page the [facility] (Director on Call) for the Hospitalists listed on amion for assistance.  09/01/2021, 10:10 PM

## 2021-09-01 NOTE — ED Provider Notes (Addendum)
[facility]GENCY DEPARTMENT Provider Note   CSN: [id number] Arrival date & time: 09/01/21  1015     History  Chief Complaint  Patient presents with   Dizziness    Jaime Gomez is a 56 y.o. female.  Patient new to the area.  Had been in the [facility] area prior.  Started seeing Dr. Marin Olp in the month of January.  Patient known to have metastatic endometrial carcinoma.  In discussion with Dr. Marin Olp some of the treatment options are limited for her at this point.  He saw her in January noted that she had a creatinine of 5 something.  There is an nephrology note from April 2022 that shows that her creatinines were tending to run around 3.  Known to have chronic kidney disease stage IV.  Patient's had extensive ureteral stents in the past.  Some due to stones some do may be to scarring.  Patient is a full code in discussion with Dr. Marin Olp.  He said they have not done any treatments so far so that would not be impacting her renal function.  She came in today really for feeling lightheaded not true vertigo.  Felt as if her sugars were low.  But they were not low here today.  Patient denies any upper extremity or lower extremity weakness.  Disorder generalized weakness.  In addition her urine output is decreased.  Past medical history significant for the endometrial cancer stage IVb.  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 failure.  Appears patient had a blood transfusion in January hemoglobin is much better today.  Had leg DVT deep venous thrombosis chronic that documented also in January of this year.  Patient is on Eliquis.      Home Medications Prior to Admission medications   Medication Sig Start Date End Date Taking? Authorizing Provider  apixaban (ELIQUIS) 2.5 MG TABS tablet Take 2.5 mg by mouth 2 (two) times daily.    [provider]  atorvastatin (LIPITOR) 10 MG tablet Take 10 mg by mouth daily at 6 (six) AM. 01/19/21   [provider]  carvedilol (COREG) 25 MG  tablet Take 25 mg by mouth daily at 6 (six) AM.    [provider]  everolimus (AFINITOR) 10 MG tablet Take 10 mg by mouth daily at 6 (six) AM. 08/18/20   [provider]  folic acid (FOLVITE) 1 MG tablet Take 1 mg by mouth daily at 6 (six) AM. 10/11/20   [provider]  gabapentin (NEURONTIN) 100 MG capsule Take 100 mg by mouth 2 (two) times daily. 08/18/20   [provider]  Insulin Aspart (NOVOLOG FLEXPEN [place]) Inject 20 Units into the skin as directed. Per meal Patient not taking: Reported on 08/18/2021    [provider]  insulin aspart protamine - aspart (NOVOLOG MIX 70/30 FLEXPEN) (70-30) 100 UNIT/ML FlexPen Inject 20 Units into the skin as directed. Per meal 01/19/21   [provider]  insulin detemir (LEVEMIR) 100 UNIT/ML injection Inject 20 Units into the skin 2 (two) times daily. 01/19/21   [provider]  letrozole [facility]) 2.5 MG tablet Take 2.5 mg by mouth daily at 6 (six) AM. 05/12/21   [provider]  nitrofurantoin, macrocrystal-monohydrate, (MACROBID) 100 MG capsule Take 100 mg by mouth 2 (two) times daily. 10/04/20   [provider]  ondansetron (ZOFRAN) 8 MG tablet Take 8 mg by mouth every 8 (eight) hours as needed. 08/18/20   [provider]  oxycodone (OXY-IR) 5 MG capsule  Take 1 capsule (5 mg total) by mouth every 4 (four) hours as needed. 08/22/21   [name], MD  pantoprazole (PROTONIX) 40 MG tablet Take 40 mg by mouth in the morning. 10/15/20   [provider]  pravastatin (PRAVACHOL) 80 MG tablet Take 80 mg by mouth daily at 6 (six) AM.    [provider]      Allergies    Taxotere [docetaxel]    Review of Systems   Review of Systems  Constitutional:  Positive for fatigue. Negative for chills and fever.  HENT:  Negative for ear pain and sore throat.   Eyes:  Negative for pain and visual disturbance.  Respiratory:  Negative for cough and shortness of breath.    Cardiovascular:  Negative for chest pain and palpitations.  Gastrointestinal:  Negative for abdominal pain and vomiting.  Genitourinary:  Positive for decreased urine volume. Negative for dysuria and hematuria.  Musculoskeletal:  Negative for arthralgias and back pain.  Skin:  Negative for color change and rash.  Neurological:  Positive for weakness and light-headedness. Negative for seizures and syncope.  All other systems reviewed and are negative.  Physical Exam Updated Vital Signs BP 124/83 (BP Location: Left Arm)    Pulse 94    Temp 99.4 F (37.4 C) (Oral)    Resp 17    Ht 1.651 m ($Remove'5\' 5"'tZhXhDI$ )    Wt 66.7 kg    SpO2 97%    BMI 24.46 kg/m  Physical Exam Vitals and nursing note reviewed.  Constitutional:      General: She is not in acute distress.    Appearance: She is well-developed. She is ill-appearing.  HENT:     Head: Normocephalic and atraumatic.  Eyes:     Extraocular Movements: Extraocular movements intact.     Conjunctiva/sclera: Conjunctivae normal.     Pupils: Pupils are equal, round, and reactive to light.  Cardiovascular:     Rate and Rhythm: Normal rate and regular rhythm.     Heart sounds: No murmur heard. Pulmonary:     Effort: Pulmonary effort is normal. No respiratory distress.     Breath sounds: Normal breath sounds.  Abdominal:     Palpations: Abdomen is soft.     Tenderness: There is no abdominal tenderness.  Musculoskeletal:        General: No swelling.     Cervical back: Normal range of motion and neck supple.  Skin:    General: Skin is warm and dry.     Capillary Refill: Capillary refill takes less than 2 seconds.  Neurological:     General: No focal deficit present.     Mental Status: She is alert and oriented to person, place, and time.     Cranial Nerves: No cranial nerve deficit.     Sensory: No sensory deficit.     Motor: No weakness.     Comments: No focal deficit.  Just some generalized weakness  Psychiatric:        Mood and Affect: Mood  normal.    ED Results / Procedures / Treatments   Labs (all labs ordered are listed, but only abnormal results are displayed) Labs Reviewed  CBC WITH DIFFERENTIAL/PLATELET - Abnormal; Notable for the following components:      Result Value   RBC 3.43 (*)    Hemoglobin 9.9 (*)    HCT 30.3 (*)    RDW 16.0 (*)    Lymphs Abs 0.2 (*)    Abs Immature Granulocytes  0.10 (*)    All other components within normal limits  COMPREHENSIVE METABOLIC PANEL - Abnormal; Notable for the following components:   Sodium 133 (*)    CO2 8 (*)    Glucose, Bld 217 (*)    BUN 122 (*)    Creatinine, Ser 8.45 (*)    Albumin 3.0 (*)    AST 144 (*)    GFR, Estimated 5 (*)    Anion gap 21 (*)    All other components within normal limits  CBG MONITORING, ED - Abnormal; Notable for the following components:   Glucose-Capillary 181 (*)    All other components within normal limits  RESP PANEL BY RT-PCR (FLU A&B, COVID) ARPGX2  URINALYSIS, ROUTINE W REFLEX MICROSCOPIC    EKG None  Radiology CT Head Wo Contrast  Result Date: 09/01/2021 CLINICAL DATA:  Mental status change, unknown cause, cervical cancer EXAM: CT HEAD WITHOUT CONTRAST TECHNIQUE: Contiguous axial images were obtained from the base of the skull through the vertex without intravenous contrast. RADIATION DOSE REDUCTION: This exam was performed according to the departmental dose-optimization program which includes automated exposure control, adjustment of the mA and/or kV according to patient size and/or use of iterative reconstruction technique. COMPARISON:  None. FINDINGS: Brain: There is no acute intracranial hemorrhage, mass effect, or edema. Gray-white differentiation is preserved. There is no extra-axial fluid collection. Ventricles and sulci are within normal limits in size and configuration. Vascular: There is atherosclerotic calcification at the skull base. Skull: Calvarium is unremarkable. Sinuses/Orbits: No acute finding. Other: None.  IMPRESSION: No acute intracranial abnormality. Electronically Signed   By: Macy Mis M.D.   On: 09/01/2021 12:46   DG Chest Port 1 View  Result Date: 09/01/2021 CLINICAL DATA:  Generalized weakness, lightheadedness EXAM: PORTABLE CHEST 1 VIEW COMPARISON:  08/19/2021 FINDINGS: Left chest wall port catheter is in similar position. No new consolidation or edema. Left suprahilar patchy density is unchanged. No pleural effusion. Normal heart size. IMPRESSION: No acute process in the chest.  Nodules on PET CT are not well seen. Electronically Signed   By: Macy Mis M.D.   On: 09/01/2021 12:35    Procedures Procedures    Medications Ordered in ED Medications - No data to display  ED Course/ Medical Decision Making/ A&P                           Medical Decision Making Amount and/or Complexity of Data Reviewed Labs: ordered. Radiology: ordered.  Risk Prescription drug management. Decision regarding hospitalization.    CRITICAL CARE Performed by: Fredia Sorrow Total critical care time: 40 minutes Critical care time was exclusive of separately billable procedures and treating other patients. Critical care was necessary to treat or prevent imminent or life-threatening deterioration. Critical care was time spent personally by me on the following activities: development of treatment plan with patient and/or surrogate as well as nursing, discussions with consultants, evaluation of patient's response to treatment, examination of patient, obtaining history from patient or surrogate, ordering and performing treatments and interventions, ordering and review of laboratory studies, ordering and review of radiographic studies, pulse oximetry and re-evaluation of patient's condition.  Patient's work-up here today is significant for hemoglobin much better after the transfusion at 9.9.  Normal leukocytosis.  COVID influenza testing is negative.  Platelets are good at 179.  The complete metabolic  panel is significant for sodium of 133 CO2 of 8.  Glucose 217 so not hypoglycemic.  But  her creatinine today is 8.45 giving her a GFR of 5.  Liver function test other than AST elevated alk phos total bili are normal.  Anion gap is 21.  It is possible patient clearly has significantly worsening creatinine.  It was 5 just in January.  Some this could be prerenal also she could have an obstruction.  So we will get CT abdomen to rule out any obstruction she certainly has had the kidney stones and ureter issues in the past and has had stents in the past no stents currently.  CT head done because of the dizziness without any acute findings.  Chest x-ray done no acute process in the chest.  Nodules on the PET/CT not well seen.  Discussed with Dr. Marin Olp as stated in the HPI.  Most likely patient is going to need admission for acute kidney injury.  Would need to sort out whether it is prerenal or postrenal.  CT renal had some significant abnormalities.  New left emphysematous pyelitis.  Unchanged moderate right and decreased now mild left hydronephrosis.  Unchanged punctate right nephrolithiasis.  Significantly thick-walled decompressed bladder consistent with cystitis.  Unchanged cholelithiasis.  Discussed with urology on-call Dr. Abner Greenspan.  Felt that patient needed stenting on the left side.  Were thinking that this is probably an obstructive issue with her worsening kidney function.  Patient will receive Rocephin here we will do an in and out cath to get a urine culture.  We will give IV fluids.  And make n.p.o.  He wants patient admitted to the [facility] long period for stenting.  Discussed with Dr. Florene Glen hospitalist.  Patient will get admitted to [facility] long on the hospitalist service.   Final Clinical Impression(s) / ED Diagnoses Final diagnoses:  Stage 4 chronic kidney disease ([place])  AKI (acute kidney injury) ([place])  Endometrial cancer [facility])    Rx / DC Orders ED Discharge Orders     None          Fredia Sorrow, MD 09/01/21 1353    Fredia Sorrow, MD 09/01/21 1521

## 2021-09-01 NOTE — ED Notes (Signed)
Pt unable to produce urine at this time.

## 2021-09-01 NOTE — ED Notes (Signed)
[facility] at bedside 

## 2021-09-01 NOTE — ED Triage Notes (Addendum)
Pt to er, pt states that she has a hx of dm, states that she feels dizzy like her sugar is low, states that she checked her blood sugar and it was normal.  Pt awake and oriented times three, denies unilateral weakness.  Pt states that her dizziness started yesterday when she was in the shower and she fell. Pt states that she had a blood transfusion about a week or two ago.

## 2021-09-01 NOTE — ED Notes (Signed)
Pt ambulatory to restroom, x1MIN assist with visitor.

## 2021-09-01 NOTE — ED Notes (Signed)
Ambulatory to BR, with assist, unable to give u/a at this time

## 2021-09-01 NOTE — Consult Note (Signed)
Urology Consult   Physician requesting consult: Dr. Burney Gauze, MD  Reason for consult: Bilateral hydronephrosis, emphysematous pyelitis  History of Present Illness: Jaime Gomez is a 56 y.o. female who has a history of metastatic endometrial carcinoma.  She relocated to [facility] from [place] in January 2023.  She was managed by Dr. Gita Kudo (urology) with what sounds like malignant bilateral ureteral obstruction from her endometrial carcinoma.  She states that she has had either bilateral ureteral stents or nephrostomy tubes since 2021.  Her tubes were removed in September 2022.  Since then, she does complain of intermittent bilateral flank pain.  She states that she does have bothersome urinary urgency associated urinary urge incontinence with bilateral stents however these were preferable to the nephrostomy tubes.  After the stents were removed, she has suffered worsening chronic kidney disease.  Creatinine today is 8.45 from 5.72 weeks ago.  She was found to have a creatinine around 3 in 10/2020 with stents in place.  CT A/P 09/01/2021 demonstrated new left emphysematous pyelitis with unchanged moderate and mild left hydroureteronephrosis.  She has punctate right nephrolithiasis and had thick walled decompressed bladder consistent with cystitis.  She does complain of bilateral flank pain.  She also complains of dysuria, fevers and chills.  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 failure 08/18/2021   Endometrial cancer, FIGO stage IVB ([place]) 08/18/2021   Goals of care, counseling/discussion 08/18/2021   Leg DVT (deep venous thromboembolism), chronic, right ([place]) 08/18/2021    Past Surgical History:  Procedure Laterality Date   IR CV LINE INJECTION  08/23/2021     Current Hospital Medications:  Home meds:  No current facility-administered medications on file prior to encounter.   Current Outpatient Medications on File Prior to Encounter  Medication Sig  Dispense Refill   apixaban (ELIQUIS) 2.5 MG TABS tablet Take 2.5 mg by mouth 2 (two) times daily.     atorvastatin (LIPITOR) 10 MG tablet Take 10 mg by mouth daily at 6 (six) AM.     carvedilol (COREG) 25 MG tablet Take 25 mg by mouth daily at 6 (six) AM.     everolimus (AFINITOR) 10 MG tablet Take 10 mg by mouth daily at 6 (six) AM.     folic acid (FOLVITE) 1 MG tablet Take 1 mg by mouth daily at 6 (six) AM.     gabapentin (NEURONTIN) 100 MG capsule Take 100 mg by mouth 2 (two) times daily.     Insulin Aspart (NOVOLOG FLEXPEN [place]) Inject 20 Units into the skin as directed. Per meal (Patient not taking: Reported on 08/18/2021)     insulin aspart protamine - aspart (NOVOLOG MIX 70/30 FLEXPEN) (70-30) 100 UNIT/ML FlexPen Inject 20 Units into the skin as directed. Per meal     insulin detemir (LEVEMIR) 100 UNIT/ML injection Inject 20 Units into the skin 2 (two) times daily.     letrozole (FEMARA) 2.5 MG tablet Take 2.5 mg by mouth daily at 6 (six) AM.     nitrofurantoin, macrocrystal-monohydrate, (MACROBID) 100 MG capsule Take 100 mg by mouth 2 (two) times daily.     ondansetron (ZOFRAN) 8 MG tablet Take 8 mg by mouth every 8 (eight) hours as needed.     oxycodone (OXY-IR) 5 MG capsule Take 1 capsule (5 mg total) by mouth every 4 (four) hours as needed. 120 capsule 0   pantoprazole (PROTONIX) 40 MG tablet Take 40 mg by mouth in the morning.     pravastatin (  PRAVACHOL) 80 MG tablet Take 80 mg by mouth daily at 6 (six) AM.       Scheduled Meds:  [START ON 09/02/2021] chlorhexidine  15 mL Mouth/Throat On Call to OR   Continuous Infusions:  sodium chloride 100 mL/hr at 09/01/21 1514   sodium chloride     PRN Meds:.  Allergies:  Allergies  Allergen Reactions   Taxotere [Docetaxel]     Throat swoll    History reviewed. No pertinent family history.  Social History:  reports that she has never smoked. She has never used smokeless tobacco. She reports that she does not currently use alcohol. She  reports that she does not use drugs.  ROS: A complete review of systems was performed.  All systems are negative except for pertinent findings as noted.  Physical Exam:  Vital signs in last 24 hours: Temp:  [99.4 F (37.4 C)] 99.4 F (37.4 C) (02/09 1028) Pulse Rate:  [88-122] 88 (02/09 1630) Resp:  [11-18] 11 (02/09 1700) BP: (100-124)/(64-83) 104/66 (02/09 1700) SpO2:  [95 %-99 %] 99 % (02/09 1630) Weight:  [66.7 kg] 66.7 kg (02/09 1027) Constitutional:  Alert and oriented, No acute distress Cardiovascular: Regular rate and rhythm Respiratory: Normal respiratory effort, Lungs clear bilaterally GI: Abdomen is soft, nontender, nondistended, no abdominal masses GU: No CVA tenderness Neurologic: Grossly intact, no focal deficits Psychiatric: Normal mood and affect  Laboratory Data:  Recent Labs    09/01/21 1044  WBC 6.1  HGB 9.9*  HCT 30.3*  PLT 179    Recent Labs    09/01/21 1044  NA 133*  K 5.1  CL 104  GLUCOSE 217*  BUN 122*  CALCIUM 8.9  CREATININE 8.45*     Results for orders placed or performed during the hospital encounter of 09/01/21 (from the past 24 hour(s))  CBG monitoring, ED     Status: Abnormal   Collection Time: 09/01/21 10:30 AM  Result Value Ref Range   Glucose-Capillary 181 (H) 70 - 99 mg/dL  CBC with Differential     Status: Abnormal   Collection Time: 09/01/21 10:44 AM  Result Value Ref Range   WBC 6.1 4.0 - 10.5 K/uL   RBC 3.43 (L) 3.87 - 5.11 MIL/uL   Hemoglobin 9.9 (L) 12.0 - 15.0 g/dL   HCT 30.3 (L) 36.0 - 46.0 %   MCV 88.3 80.0 - 100.0 fL   MCH 28.9 26.0 - 34.0 pg   MCHC 32.7 30.0 - 36.0 g/dL   RDW 16.0 (H) 11.5 - 15.5 %   Platelets 179 150 - 400 K/uL   nRBC 0.0 0.0 - 0.2 %   Neutrophils Relative % 93 %   Neutro Abs 5.7 1.7 - 7.7 K/uL   Lymphocytes Relative 3 %   Lymphs Abs 0.2 (L) 0.7 - 4.0 K/uL   Monocytes Relative 2 %   Monocytes Absolute 0.1 0.1 - 1.0 K/uL   Eosinophils Relative 0 %   Eosinophils Absolute 0.0 0.0 - 0.5  K/uL   Basophils Relative 0 %   Basophils Absolute 0.0 0.0 - 0.1 K/uL   Immature Granulocytes 2 %   Abs Immature Granulocytes 0.10 (H) 0.00 - 0.07 K/uL  Comprehensive metabolic panel     Status: Abnormal   Collection Time: 09/01/21 10:44 AM  Result Value Ref Range   Sodium 133 (L) 135 - 145 mmol/L   Potassium 5.1 3.5 - 5.1 mmol/L   Chloride 104 98 - 111 mmol/L   CO2 8 (L) 22 - 32  mmol/L   Glucose, Bld 217 (H) 70 - 99 mg/dL   BUN 122 (H) 6 - 20 mg/dL   Creatinine, Ser 8.45 (H) 0.44 - 1.00 mg/dL   Calcium 8.9 8.9 - 10.3 mg/dL   Total Protein 7.9 6.5 - 8.1 g/dL   Albumin 3.0 (L) 3.5 - 5.0 g/dL   AST 144 (H) 15 - 41 U/L   ALT 27 0 - 44 U/L   Alkaline Phosphatase 84 38 - 126 U/L   Total Bilirubin 0.9 0.3 - 1.2 mg/dL   GFR, Estimated 5 (L) >60 mL/min   Anion gap 21 (H) 5 - 15  Resp Panel by RT-PCR (Flu A&B, Covid) Nasopharyngeal Swab     Status: None   Collection Time: 09/01/21 10:52 AM   Specimen: Nasopharyngeal Swab; Nasopharyngeal(NP) swabs in vial transport medium  Result Value Ref Range   SARS Coronavirus 2 by RT PCR NEGATIVE NEGATIVE   Influenza A by PCR NEGATIVE NEGATIVE   Influenza B by PCR NEGATIVE NEGATIVE  Urinalysis, Routine w reflex microscopic Urine, Clean Catch     Status: Abnormal   Collection Time: 09/01/21 12:04 PM  Result Value Ref Range   Color, Urine YELLOW YELLOW   APPearance CLOUDY (A) CLEAR   Specific Gravity, Urine 1.025 1.005 - 1.030   pH 6.0 5.0 - 8.0   Glucose, UA 250 (A) NEGATIVE mg/dL   Hgb urine dipstick LARGE (A) NEGATIVE   Bilirubin Urine NEGATIVE NEGATIVE   Ketones, ur 15 (A) NEGATIVE mg/dL   Protein, ur >300 (A) NEGATIVE mg/dL   Nitrite NEGATIVE NEGATIVE   Leukocytes,Ua SMALL (A) NEGATIVE  Urinalysis, Microscopic (reflex)     Status: Abnormal   Collection Time: 09/01/21 12:04 PM  Result Value Ref Range   RBC / HPF 11-20 0 - 5 RBC/hpf   WBC, UA 21-50 0 - 5 WBC/hpf   Bacteria, UA FEW (A) NONE SEEN   Squamous Epithelial / LPF 0-5 0 - 5    WBC Clumps PRESENT    Recent Results (from the past 240 hour(s))  Resp Panel by RT-PCR (Flu A&B, Covid) Nasopharyngeal Swab     Status: None   Collection Time: 09/01/21 10:52 AM   Specimen: Nasopharyngeal Swab; Nasopharyngeal(NP) swabs in vial transport medium  Result Value Ref Range Status   SARS Coronavirus 2 by RT PCR NEGATIVE NEGATIVE Final    Comment: (NOTE) SARS-CoV-2 target nucleic acids are NOT DETECTED.  The SARS-CoV-2 RNA is generally detectable in upper respiratory specimens during the acute phase of infection. The lowest concentration of SARS-CoV-2 viral copies this assay can detect is 138 copies/mL. A negative result does not preclude SARS-Cov-2 infection and should not be used as the sole basis for treatment or other patient management decisions. A negative result may occur with  improper specimen collection/handling, submission of specimen other than nasopharyngeal swab, presence of viral mutation(s) within the areas targeted by this assay, and inadequate number of viral copies(<138 copies/mL). A negative result must be combined with clinical observations, patient history, and epidemiological information. The expected result is Negative.  Fact Sheet for Patients:  EntrepreneurPulse.com.au  Fact Sheet for Healthcare Providers:  IncredibleEmployment.be  This test is no t yet approved or cleared by the [place] FDA and  has been authorized for detection and/or diagnosis of SARS-CoV-2 by FDA under an Emergency Use Authorization (EUA). This EUA will remain  in effect (meaning this test can be used) for the duration of the COVID-19 declaration under Section 564(b)(1) of the Act, 21  U.S.C.section 360bbb-3(b)(1), unless the authorization is terminated  or revoked sooner.       Influenza A by PCR NEGATIVE NEGATIVE Final   Influenza B by PCR NEGATIVE NEGATIVE Final    Comment: (NOTE) The Xpert Xpress SARS-CoV-2/FLU/RSV plus assay  is intended as an aid in the diagnosis of influenza from Nasopharyngeal swab specimens and should not be used as a sole basis for treatment. Nasal washings and aspirates are unacceptable for Xpert Xpress SARS-CoV-2/FLU/RSV testing.  Fact Sheet for Patients: EntrepreneurPulse.com.au  Fact Sheet for Healthcare Providers: IncredibleEmployment.be  This test is not yet approved or cleared by the [place] FDA and has been authorized for detection and/or diagnosis of SARS-CoV-2 by FDA under an Emergency Use Authorization (EUA). This EUA will remain in effect (meaning this test can be used) for the duration of the COVID-19 declaration under Section 564(b)(1) of the Act, 21 U.S.C. section 360bbb-3(b)(1), unless the authorization is terminated or revoked.  Performed at [facility], [address] [place], [place] [address]     Renal Function: Recent Labs    09/01/21 1044  CREATININE 8.45*   Estimated Creatinine Clearance: 6.6 mL/min (A) (by C-G formula based on SCr of 8.45 mg/dL (H)).  Radiologic Imaging: CT Head Wo Contrast  Result Date: 09/01/2021 CLINICAL DATA:  Mental status change, unknown cause, cervical cancer EXAM: CT HEAD WITHOUT CONTRAST TECHNIQUE: Contiguous axial images were obtained from the base of the skull through the vertex without intravenous contrast. RADIATION DOSE REDUCTION: This exam was performed according to the departmental dose-optimization program which includes automated exposure control, adjustment of the mA and/or kV according to patient size and/or use of iterative reconstruction technique. COMPARISON:  None. FINDINGS: Brain: There is no acute intracranial hemorrhage, mass effect, or edema. Gray-white differentiation is preserved. There is no extra-axial fluid collection. Ventricles and sulci are within normal limits in size and configuration. Vascular: There is atherosclerotic calcification at the skull  base. Skull: Calvarium is unremarkable. Sinuses/Orbits: No acute finding. Other: None. IMPRESSION: No acute intracranial abnormality. Electronically Signed   By: Macy Mis M.D.   On: 09/01/2021 12:46   DG Chest Port 1 View  Result Date: 09/01/2021 CLINICAL DATA:  Generalized weakness, lightheadedness EXAM: PORTABLE CHEST 1 VIEW COMPARISON:  08/19/2021 FINDINGS: Left chest wall port catheter is in similar position. No new consolidation or edema. Left suprahilar patchy density is unchanged. No pleural effusion. Normal heart size. IMPRESSION: No acute process in the chest.  Nodules on PET CT are not well seen. Electronically Signed   By: Macy Mis M.D.   On: 09/01/2021 12:35   CT Renal Stone Study  Result Date: 09/01/2021 CLINICAL DATA:  Kidney pain. Hematuria. History of cervical cancer. EXAM: CT ABDOMEN AND PELVIS WITHOUT CONTRAST TECHNIQUE: Multidetector CT imaging of the abdomen and pelvis was performed following the standard protocol without IV contrast. RADIATION DOSE REDUCTION: This exam was performed according to the departmental dose-optimization program which includes automated exposure control, adjustment of the mA and/or kV according to patient size and/or use of iterative reconstruction technique. COMPARISON:  PET-CT dated August 24, 2021. FINDINGS: Lower chest: No acute abnormality. Hepatobiliary: No focal liver abnormality. Unchanged gallstone. No gallbladder wall thickening or biliary dilatation. Pancreas: Unremarkable. No pancreatic ductal dilatation or surrounding inflammatory changes. Spleen: Normal in size without focal abnormality. Adrenals/Urinary Tract: Adrenal glands are unremarkable. Unchanged moderate right and decreased now mild left hydroureteronephrosis. New foci of air within the left renal collecting system. Unchanged punctate calculus in the posterior  right kidney upper pole. No ureteral calculi. Bilateral renovascular calcifications again noted. Significantly  thick-walled, decompressed bladder. Stomach/Bowel: Stomach is within normal limits. Appendix appears normal. No evidence of bowel wall thickening, distention, or inflammatory changes. Vascular/Lymphatic: Aortic atherosclerosis. Infrarenal IVC filter again noted. No enlarged abdominal or pelvic lymph nodes. Reproductive: Status post hysterectomy. No adnexal masses. Other: No free fluid or pneumoperitoneum. Prior ventral hernia repair with unchanged mild anterior abdominal diastasis. Musculoskeletal: No acute or significant osseous findings. IMPRESSION: 1. New left emphysematous pyelitis. Unchanged moderate right and decreased now mild left hydroureteronephrosis. 2. Unchanged punctate right nephrolithiasis. 3. Significantly thick-walled, decompressed bladder, consistent with cystitis. 4. Unchanged cholelithiasis. 5. Aortic Atherosclerosis (ICD10-I70.0). Electronically Signed   By: Titus Dubin M.D.   On: 09/01/2021 14:13    I independently reviewed the above imaging studies.  Impression/Recommendation: Left emphysematous pyelitis: Seen on CT A/P 09/01/2021.  Urine culture pending. 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  She was previously managed with bilateral ureteral stents/nephrostomy tubes since 2021 in [place]. AKI in CKD: Creatinine was 8.45 on 09/01/2021 from 5.75 in January 2023 and 3 in April/2022 Metastatic adenocarcinoma of the endometrium/ovary  -Given evidence of left emphysematous pyelitis, bilateral hydroureteronephrosis in the setting of AKI on CKD, I recommend cystoscopy, bilateral retrograde pyelogram, bilateral ureteral stent placement.  She was previously managed with bilateral ureteral stents and nephrostomy tubes.  She states that stents were preferable to nephrostomy tubes.  She does state that she developed urinary urgency associate with urinary urge incontinence with indwelling stents.  We will manage postoperatively  with Myrbetriq. -Unfortunately, after the stents were removed in [facility], it looks like she redeveloped hydronephrosis with worsening kidney function.  We will continue to trend kidney function with indwelling stents.  If evidence of obstruction despite indwelling stents, she may need nephrostomy tubes. -I will arrange for follow-up in my office in a few weeks to further discuss. -We will tentatively plan for either stent exchanges versus nephrostomy tubes based on her wishes.  -The risks, benefits and alternatives of cystoscopy with bilateral JJ stent placement was discussed with the patient.  Risks include, but are not limited to: bleeding, urinary tract infection, ureteral injury, ureteral stricture disease, chronic pain, urinary symptoms, bladder injury, stent migration, the need for nephrostomy tube placement, MI, CVA, DVT, PE and the inherent risks with general anesthesia.  The patient voices understanding and wishes to proceed.   Matt R. Estalene Bergey MD 09/01/2021, [facility]  Pager: [phone]   CC: Burney Gauze, MD

## 2021-09-01 NOTE — Treatment Plan (Addendum)
56 yo who recently moved from [facility] to be closer to family with hx metastatic endometrial carcinoma (recently established with Dr. Marin Olp), CKD IV (baseline creatinine ~3.14 in April 2022), T2DM, hx DVT (on eliquis) who presented with generalized weakness and lightheadedness in addition to decreased UOP.  Vitals with mild intermittent tachycardia.  Labs notable for creatinine 8.45 (most recent from 1/26 was 5.75), mildly elevated AST, mild hyponatremia, mild anemia.  CT stone study with new L emphysematous pyelitis and unchanged moderate right and mild L hydroureteronephrosis.  CXR without acute process.  Head CT without acute process.  EDP discussed with Dr. Abner Greenspan from urology who thought that she needs stents.  I expect she'll likely need nephrology consult as well given AKI on CKD IV and decreased UOP.  Will plan for admission to telemetry at [facility] per urology request.  She's been started on ceftriaxone.  Requested urine culture.

## 2021-09-01 NOTE — Transfer of Care (Signed)
Immediate Anesthesia Transfer of Care Note  Patient: Jaime Gomez  Procedure(s) Performed: CYSTOSCOPY WITH RETROGRADE PYELOGRAM/URETERAL STENT PLACEMENT (Bilateral: Ureter)  Patient Location: PACU  Anesthesia Type:General  Level of Consciousness: awake, alert , oriented, drowsy and patient cooperative  Airway & Oxygen Therapy: Patient Spontanous Breathing and Patient connected to face mask oxygen  Post-op Assessment: Report given to RN and Post -op Vital signs reviewed and stable  Post vital signs: Reviewed and stable  Last Vitals:  Vitals Value Taken Time  BP 130/74 09/01/21 1938  Temp    Pulse 101 09/01/21 1940  Resp 9 09/01/21 1940  SpO2 100 % 09/01/21 1940  Vitals shown include unvalidated device data.  Last Pain:  Vitals:   09/01/21 1820  TempSrc:   PainSc: 0-No pain         Complications: No notable events documented.

## 2021-09-01 NOTE — ED Notes (Signed)
ED TO INPATIENT HANDOFF REPORT  ED Nurse Name and Phone #: Baxter Flattery, RN  S Name/Age/Gender Jaime Gomez 56 y.o. female Room/Bed: MH05/MH05  Code Status   Code Status: Not on file  Home/SNF/Other Home Patient oriented to: self, place, time, and situation Is this baseline? Yes   Triage Complete: Triage complete  Chief Complaint AKI (acute kidney injury) ([place]) [N17.9]  Triage Note Pt to er, pt states that she has a hx of dm, states that she feels dizzy like her sugar is low, states that she checked her blood sugar and it was normal.  Pt awake and oriented times three, denies unilateral weakness.  Pt states that her dizziness started yesterday when she was in the shower and she fell. Pt states that she had a blood transfusion about a week or two ago.    Allergies Allergies  Allergen Reactions   Taxotere [Docetaxel]     Throat swoll    Level of Care/Admitting Diagnosis ED Disposition     ED Disposition  Admit   Condition  --   Comment  Hospital Area: [place] [[id number]]  Level of Care: Telemetry [5]  Admit to tele based on following criteria: Complex arrhythmia (Bradycardia/Tachycardia)  May admit patient to [facility] or [facility] if equivalent level of care is available:: No  Interfacility transfer: Yes  Covid Evaluation: Asymptomatic Screening Protocol (No Symptoms)  Diagnosis: AKI (acute kidney injury) [facility]) [[id number]]  Admitting Physician: Elodia Florence [phone]  Attending Physician: Cephus Slater, A CALDWELL [phone]  Estimated length of stay: past midnight tomorrow  Certification:: I certify this patient will need inpatient services for at least 2 midnights          B Medical/Surgery History 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 failure 08/18/2021   Endometrial cancer, FIGO stage IVB ([place]) 08/18/2021   Goals of care, counseling/discussion 08/18/2021   Leg DVT (deep venous thromboembolism), chronic, right  ([place]) 08/18/2021   Past Surgical History:  Procedure Laterality Date   IR CV LINE INJECTION  08/23/2021     A IV Location/Drains/Wounds Patient Lines/Drains/Airways Status     Active Line/Drains/Airways     Name Placement date Placement time Site Days   Peripheral IV 09/01/21 20 G 1" Anterior;Right Forearm 09/01/21  1046  Forearm  less than 1            Intake/Output Last 24 hours  Intake/Output Summary (Last 24 hours) at 09/01/2021 1714 Last data filed at 09/01/2021 1629 Gross per 24 hour  Intake 599.3 ml  Output --  Net 599.3 ml    Labs/Imaging Results for orders placed or performed during the hospital encounter of 09/01/21 (from the past 48 hour(s))  CBG monitoring, ED     Status: Abnormal   Collection Time: 09/01/21 10:30 AM  Result Value Ref Range   Glucose-Capillary 181 (H) 70 - 99 mg/dL    Comment: Glucose reference range applies only to samples taken after fasting for at least 8 hours.  CBC with Differential     Status: Abnormal   Collection Time: 09/01/21 10:44 AM  Result Value Ref Range   WBC 6.1 4.0 - 10.5 K/uL   RBC 3.43 (L) 3.87 - 5.11 MIL/uL   Hemoglobin 9.9 (L) 12.0 - 15.0 g/dL   HCT 30.3 (L) 36.0 - 46.0 %   MCV 88.3 80.0 - 100.0 fL   MCH 28.9 26.0 - 34.0 pg   MCHC 32.7 30.0 - 36.0 g/dL  RDW 16.0 (H) 11.5 - 15.5 %   Platelets 179 150 - 400 K/uL   nRBC 0.0 0.0 - 0.2 %   Neutrophils Relative % 93 %   Neutro Abs 5.7 1.7 - 7.7 K/uL   Lymphocytes Relative 3 %   Lymphs Abs 0.2 (L) 0.7 - 4.0 K/uL   Monocytes Relative 2 %   Monocytes Absolute 0.1 0.1 - 1.0 K/uL   Eosinophils Relative 0 %   Eosinophils Absolute 0.0 0.0 - 0.5 K/uL   Basophils Relative 0 %   Basophils Absolute 0.0 0.0 - 0.1 K/uL   Immature Granulocytes 2 %   Abs Immature Granulocytes 0.10 (H) 0.00 - 0.07 K/uL    Comment: Performed at [facility], [address] [place], [place] [address]  Comprehensive metabolic panel     Status: Abnormal   Collection Time: 09/01/21 10:44  AM  Result Value Ref Range   Sodium 133 (L) 135 - 145 mmol/L   Potassium 5.1 3.5 - 5.1 mmol/L   Chloride 104 98 - 111 mmol/L   CO2 8 (L) 22 - 32 mmol/L   Glucose, Bld 217 (H) 70 - 99 mg/dL    Comment: Glucose reference range applies only to samples taken after fasting for at least 8 hours.   BUN 122 (H) 6 - 20 mg/dL    Comment: RESULTS CONFIRMED BY MANUAL DILUTION   Creatinine, Ser 8.45 (H) 0.44 - 1.00 mg/dL   Calcium 8.9 8.9 - 10.3 mg/dL   Total Protein 7.9 6.5 - 8.1 g/dL   Albumin 3.0 (L) 3.5 - 5.0 g/dL   AST 144 (H) 15 - 41 U/L   ALT 27 0 - 44 U/L   Alkaline Phosphatase 84 38 - 126 U/L   Total Bilirubin 0.9 0.3 - 1.2 mg/dL   GFR, Estimated 5 (L) >60 mL/min    Comment: (NOTE) Calculated using the CKD-EPI Creatinine Equation (2021)    Anion gap 21 (H) 5 - 15    Comment: Performed at [facility], [address] [place], [place] [address]  Resp Panel by RT-PCR (Flu A&B, Covid) Nasopharyngeal Swab     Status: None   Collection Time: 09/01/21 10:52 AM   Specimen: Nasopharyngeal Swab; Nasopharyngeal(NP) swabs in vial transport medium  Result Value Ref Range   SARS Coronavirus 2 by RT PCR NEGATIVE NEGATIVE    Comment: (NOTE) SARS-CoV-2 target nucleic acids are NOT DETECTED.  The SARS-CoV-2 RNA is generally detectable in upper respiratory specimens during the acute phase of infection. The lowest concentration of SARS-CoV-2 viral copies this assay can detect is 138 copies/mL. A negative result does not preclude SARS-Cov-2 infection and should not be used as the sole basis for treatment or other patient management decisions. A negative result may occur with  improper specimen collection/handling, submission of specimen other than nasopharyngeal swab, presence of viral mutation(s) within the areas targeted by this assay, and inadequate number of viral copies(<138 copies/mL). A negative result must be combined with clinical observations, patient history, and  epidemiological information. The expected result is Negative.  Fact Sheet for Patients:  EntrepreneurPulse.com.au  Fact Sheet for Healthcare Providers:  IncredibleEmployment.be  This test is no t yet approved or cleared by the [place] FDA and  has been authorized for detection and/or diagnosis of SARS-CoV-2 by FDA under an Emergency Use Authorization (EUA). This EUA will remain  in effect (meaning this test can be used) for the duration of the COVID-19 declaration under Section  564(b)(1) of the Act, 21 U.S.C.section 360bbb-3(b)(1), unless the authorization is terminated  or revoked sooner.       Influenza A by PCR NEGATIVE NEGATIVE   Influenza B by PCR NEGATIVE NEGATIVE    Comment: (NOTE) The Xpert Xpress SARS-CoV-2/FLU/RSV plus assay is intended as an aid in the diagnosis of influenza from Nasopharyngeal swab specimens and should not be used as a sole basis for treatment. Nasal washings and aspirates are unacceptable for Xpert Xpress SARS-CoV-2/FLU/RSV testing.  Fact Sheet for Patients: EntrepreneurPulse.com.au  Fact Sheet for Healthcare Providers: IncredibleEmployment.be  This test is not yet approved or cleared by the [place] FDA and has been authorized for detection and/or diagnosis of SARS-CoV-2 by FDA under an Emergency Use Authorization (EUA). This EUA will remain in effect (meaning this test can be used) for the duration of the COVID-19 declaration under Section 564(b)(1) of the Act, 21 U.S.C. section 360bbb-3(b)(1), unless the authorization is terminated or revoked.  Performed at [facility], [address] [place], [place] [address]   Urinalysis, Routine w reflex microscopic Urine, Clean Catch     Status: Abnormal   Collection Time: 09/01/21 12:04 PM  Result Value Ref Range   Color, Urine YELLOW YELLOW   APPearance CLOUDY (A) CLEAR   Specific Gravity, Urine 1.025  1.005 - 1.030   pH 6.0 5.0 - 8.0   Glucose, UA 250 (A) NEGATIVE mg/dL   Hgb urine dipstick LARGE (A) NEGATIVE   Bilirubin Urine NEGATIVE NEGATIVE   Ketones, ur 15 (A) NEGATIVE mg/dL   Protein, ur >300 (A) NEGATIVE mg/dL   Nitrite NEGATIVE NEGATIVE   Leukocytes,Ua SMALL (A) NEGATIVE    Comment: Performed at [facility], [address] [place], [place] [address]  Urinalysis, Microscopic (reflex)     Status: Abnormal   Collection Time: 09/01/21 12:04 PM  Result Value Ref Range   RBC / HPF 11-20 0 - 5 RBC/hpf   WBC, UA 21-50 0 - 5 WBC/hpf   Bacteria, UA FEW (A) NONE SEEN   Squamous Epithelial / LPF 0-5 0 - 5   WBC Clumps PRESENT     Comment: Performed at [facility], [address] [place], [place] [address]   CT Head Wo Contrast  Result Date: 09/01/2021 CLINICAL DATA:  Mental status change, unknown cause, cervical cancer EXAM: CT HEAD WITHOUT CONTRAST TECHNIQUE: Contiguous axial images were obtained from the base of the skull through the vertex without intravenous contrast. RADIATION DOSE REDUCTION: This exam was performed according to the departmental dose-optimization program which includes automated exposure control, adjustment of the mA and/or kV according to patient size and/or use of iterative reconstruction technique. COMPARISON:  None. FINDINGS: Brain: There is no acute intracranial hemorrhage, mass effect, or edema. Gray-white differentiation is preserved. There is no extra-axial fluid collection. Ventricles and sulci are within normal limits in size and configuration. Vascular: There is atherosclerotic calcification at the skull base. Skull: Calvarium is unremarkable. Sinuses/Orbits: No acute finding. Other: None. IMPRESSION: No acute intracranial abnormality. Electronically Signed   By: Macy Mis M.D.   On: 09/01/2021 12:46   DG Chest Port 1 View  Result Date: 09/01/2021 CLINICAL DATA:  Generalized weakness, lightheadedness EXAM: PORTABLE CHEST 1 VIEW  COMPARISON:  08/19/2021 FINDINGS: Left chest wall port catheter is in similar position. No new consolidation or edema. Left suprahilar patchy density is unchanged. No pleural effusion. Normal heart size. IMPRESSION: No acute process in the chest.  Nodules on  PET CT are not well seen. Electronically Signed   By: Macy Mis M.D.   On: 09/01/2021 12:35   CT Renal Stone Study  Result Date: 09/01/2021 CLINICAL DATA:  Kidney pain. Hematuria. History of cervical cancer. EXAM: CT ABDOMEN AND PELVIS WITHOUT CONTRAST TECHNIQUE: Multidetector CT imaging of the abdomen and pelvis was performed following the standard protocol without IV contrast. RADIATION DOSE REDUCTION: This exam was performed according to the departmental dose-optimization program which includes automated exposure control, adjustment of the mA and/or kV according to patient size and/or use of iterative reconstruction technique. COMPARISON:  PET-CT dated August 24, 2021. FINDINGS: Lower chest: No acute abnormality. Hepatobiliary: No focal liver abnormality. Unchanged gallstone. No gallbladder wall thickening or biliary dilatation. Pancreas: Unremarkable. No pancreatic ductal dilatation or surrounding inflammatory changes. Spleen: Normal in size without focal abnormality. Adrenals/Urinary Tract: Adrenal glands are unremarkable. Unchanged moderate right and decreased now mild left hydroureteronephrosis. New foci of air within the left renal collecting system. Unchanged punctate calculus in the posterior right kidney upper pole. No ureteral calculi. Bilateral renovascular calcifications again noted. Significantly thick-walled, decompressed bladder. Stomach/Bowel: Stomach is within normal limits. Appendix appears normal. No evidence of bowel wall thickening, distention, or inflammatory changes. Vascular/Lymphatic: Aortic atherosclerosis. Infrarenal IVC filter again noted. No enlarged abdominal or pelvic lymph nodes. Reproductive: Status post hysterectomy.  No adnexal masses. Other: No free fluid or pneumoperitoneum. Prior ventral hernia repair with unchanged mild anterior abdominal diastasis. Musculoskeletal: No acute or significant osseous findings. IMPRESSION: 1. New left emphysematous pyelitis. Unchanged moderate right and decreased now mild left hydroureteronephrosis. 2. Unchanged punctate right nephrolithiasis. 3. Significantly thick-walled, decompressed bladder, consistent with cystitis. 4. Unchanged cholelithiasis. 5. Aortic Atherosclerosis (ICD10-I70.0). Electronically Signed   By: Titus Dubin M.D.   On: 09/01/2021 14:13    Pending Labs Unresulted Labs (From admission, onward)     Start     Ordered   09/01/21 1523  Urine Culture  Once,   STAT       Question:  Indication  Answer:  Dysuria   09/01/21 1522            Vitals/Pain Today's Vitals   09/01/21 1333 09/01/21 1423 09/01/21 1500 09/01/21 1630  BP: 124/83 105/66 110/74 110/64  Pulse: 94 92 95 88  Resp: 17 14 16 11   Temp:      TempSrc:      SpO2: 97% 99% 99% 99%  Weight:      Height:      PainSc: 6        Isolation Precautions Airborne and Contact precautions  Medications Medications  0.9 %  sodium chloride infusion ( Intravenous New Bag/Given 09/01/21 1514)  cefTRIAXone (ROCEPHIN) 1 g in sodium chloride 0.9 % 100 mL IVPB (0 g Intravenous Stopped 09/01/21 1517)  sodium chloride 0.9 % bolus 500 mL (0 mLs Intravenous Stopped 09/01/21 1629)    Mobility walks Moderate fall risk   Focused Assessments:   R Recommendations: See Admitting Provider Note  Report given to:   Additional Notes:

## 2021-09-01 NOTE — Op Note (Signed)
Operative Note  Preoperative diagnosis:  1.  Left emphysematous pyelitis 2. Bilateral hydronephrosis  Postoperative diagnosis: 1.  Left emphysematous pyelitis 2. Bilateral hydronephrosis  Procedure(s): 1.  Cystoscopy 2. Right retrograde pyelogram with interpretation 3. Right ureteral stent placement 4. Fluoroscopy <1 hour with intraoperative interpretation  Surgeon: Rexene Alberts, MD  Assistants:  None  Anesthesia:  General  Complications:  None  EBL:  Minimal  Specimens: 1. None  Drains/Catheters: 1.  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 Unable to identify the left ureteral orifice given the radiation cystitis. Able to visualize the right ureteral orifice and she underwent successful right stent placement. Right retrograde pyelogram demonstrated mild right hydronephrosis. Successful right ureteral stent placement with curl in the renal pelvis and bladder respectively. Unable to stent the left ureter as we were unable to identify given radiation cystitis.  Indication:  Jaime Gomez is a 56 y.o. female with metastatic endometrial carcinoma with a former history of malignant obstruction s/p bilateral ureteral stents managed in [place], [place]. She recently relocated to [place]. She was found to have symptoms of a UTI and CT A/P 09/01/2021 demonstrated left-sided emphysematous pyelonephritis as well as bilateral hydronephrosis with signs of AKI and CKD.  After reviewing the management options for treatment, she elected to proceed with the above surgical procedure(s). We have discussed the potential benefits and risks of the procedure, side effects of the proposed treatment, the likelihood of the patient achieving the goals of the procedure, and any potential problems that might occur during the procedure or recuperation. Informed consent has been  obtained.  Description of procedure: The patient was taken to the operating room and general anesthesia was induced.  The patient was placed in the dorsal lithotomy position, prepped and draped in the usual sterile fashion, and preoperative antibiotics were administered. A preoperative time-out was performed.   Cystourethroscopy was performed.  The patients urethra was examined and was normal. The bladder was then systematically examined in its entirety.  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  I was unable to visualize the left ureteral orifice given the cystitis.  I did evacuate and flush her bladder.  I attempted multiple times with a sensor wire probing to various areas on the left aspect of her trigone with inability to pass a wire through any identifiable ureteral orifice.  I was able to visualize the right ureteral orifice.  Attention then turned to the right ureteral orifice. A 0.038 zip wire was passed through the  orifice and over the wire a 5 Fr open ended catheter was inserted and passed up to the level of the renal pelvis. Omnipaque contrast was injected through the ureteral catheter and a retrograde pyelogram was performed with findings as dictated above. The wire was then replaced and the open ended catheter was removed.   A 6Fr x 24cm ureteral stent was advance over the wire. The stent was positioned appropriately under fluoroscopic and cystoscopic guidance.  The wire was then removed with an adequate stent curl noted in the renal pelvis as well as in the bladder.  A Foley catheter was placed.  The bladder was then emptied and the procedure ended.  The patient appeared to tolerate the procedure well and without complications.  The patient was able to be awakened and transferred to the recovery unit in satisfactory condition.   Plan: I will place a consult for interventional radiology  to place a left nephrostomy tube tomorrow given her history of emphysematous  pyelitis, left hydronephrosis, AKI on CKD and inability retrograde stent the left ureter given her radiation cystitis.  Matt R. [facility]  Pager: [phone]

## 2021-09-01 NOTE — Progress Notes (Signed)
PHARMACY - EVEROLIMUS  Everolimus ([employer]; Zortress) hold criteria Hgb < 8 ANC < 1 Pltc < 50K SCr > 1.5x baseline (or > 2 if baseline unknown) Infection Unexplained pneumonitis / hypoxemia Active infection   2/9:  On Ceftriaxone for UTI.   Plan:  Everolimus held for active infection per P&T policy.  Will follow for appropriate time to resume therapy.  Leone Haven, PharmD

## 2021-09-02 ENCOUNTER — Encounter (HOSPITAL_COMMUNITY): Payer: Self-pay | Admitting: Urology

## 2021-09-02 DIAGNOSIS — E875 Hyperkalemia: Secondary | ICD-10-CM

## 2021-09-02 DIAGNOSIS — N189 Chronic kidney disease, unspecified: Secondary | ICD-10-CM | POA: Diagnosis not present

## 2021-09-02 DIAGNOSIS — N179 Acute kidney failure, unspecified: Secondary | ICD-10-CM | POA: Diagnosis not present

## 2021-09-02 DIAGNOSIS — N3090 Cystitis, unspecified without hematuria: Secondary | ICD-10-CM

## 2021-09-02 DIAGNOSIS — N12 Tubulo-interstitial nephritis, not specified as acute or chronic: Secondary | ICD-10-CM

## 2021-09-02 DIAGNOSIS — C541 Malignant neoplasm of endometrium: Secondary | ICD-10-CM | POA: Diagnosis not present

## 2021-09-02 DIAGNOSIS — R531 Weakness: Secondary | ICD-10-CM | POA: Diagnosis not present

## 2021-09-02 DIAGNOSIS — R42 Dizziness and giddiness: Secondary | ICD-10-CM | POA: Diagnosis not present

## 2021-09-02 LAB — CBC
HCT: 26.5 % — ABNORMAL LOW (ref 36.0–46.0)
Hemoglobin: 8.5 g/dL — ABNORMAL LOW (ref 12.0–15.0)
MCH: 28.9 pg (ref 26.0–34.0)
MCHC: 32.1 g/dL (ref 30.0–36.0)
MCV: 90.1 fL (ref 80.0–100.0)
Platelets: 111 10*3/uL — ABNORMAL LOW (ref 150–400)
RBC: 2.94 MIL/uL — ABNORMAL LOW (ref 3.87–5.11)
RDW: 15.9 % — ABNORMAL HIGH (ref 11.5–15.5)
WBC: 1.8 10*3/uL — ABNORMAL LOW (ref 4.0–10.5)
nRBC: 0 % (ref 0.0–0.2)

## 2021-09-02 LAB — HIV ANTIBODY (ROUTINE TESTING W REFLEX): HIV Screen 4th Generation wRfx: NONREACTIVE

## 2021-09-02 LAB — RENAL FUNCTION PANEL
Albumin: 2.4 g/dL — ABNORMAL LOW (ref 3.5–5.0)
Anion gap: 15 (ref 5–15)
BUN: 118 mg/dL — ABNORMAL HIGH (ref 6–20)
CO2: 11 mmol/L — ABNORMAL LOW (ref 22–32)
Calcium: 8 mg/dL — ABNORMAL LOW (ref 8.9–10.3)
Chloride: 113 mmol/L — ABNORMAL HIGH (ref 98–111)
Creatinine, Ser: 7.61 mg/dL — ABNORMAL HIGH (ref 0.44–1.00)
GFR, Estimated: 6 mL/min — ABNORMAL LOW (ref >60)
Glucose, Bld: 169 mg/dL — ABNORMAL HIGH (ref 70–99)
Phosphorus: 11.3 mg/dL — ABNORMAL HIGH (ref 2.5–4.6)
Potassium: 5.2 mmol/L — ABNORMAL HIGH (ref 3.5–5.1)
Sodium: 139 mmol/L (ref 135–145)

## 2021-09-02 LAB — GLUCOSE, CAPILLARY
Glucose-Capillary: 159 mg/dL — ABNORMAL HIGH (ref 70–99)
Glucose-Capillary: 146 mg/dL — ABNORMAL HIGH (ref 70–99)
Glucose-Capillary: 115 mg/dL — ABNORMAL HIGH (ref 70–99)
Glucose-Capillary: 174 mg/dL — ABNORMAL HIGH (ref 70–99)

## 2021-09-02 MED ORDER — DARIFENACIN HYDROBROMIDE ER 15 MG PO TB24
15.0000 mg | ORAL_TABLET | Freq: Every day | ORAL | Status: DC
  Administered 2021-09-02 – 2021-09-04 (×3): 15 mg via ORAL
  Filled 2021-09-02 (×4): qty 1

## 2021-09-02 MED ORDER — BELLADONNA ALKALOIDS-OPIUM 16.2-30 MG RE SUPP: 1.0000 | Freq: Three times a day (TID) | RECTAL | Status: DC | PRN

## 2021-09-02 MED ORDER — TBO-FILGRASTIM 480 MCG/0.8ML ~~LOC~~ SOSY
480.0000 ug | PREFILLED_SYRINGE | Freq: Once | SUBCUTANEOUS | Status: AC
  Administered 2021-09-02: 480 ug via SUBCUTANEOUS
  Filled 2021-09-02: qty 0.8

## 2021-09-02 MED ORDER — MIRABEGRON ER 25 MG PO TB24: 25.0000 mg | ORAL_TABLET | Freq: Every day | ORAL | 11 refills | Status: DC

## 2021-09-02 NOTE — Assessment & Plan Note (Addendum)
Improved at this time. 

## 2021-09-02 NOTE — Plan of Care (Signed)
°  Problem: Clinical Measurements: Goal: Respiratory complications will improve Outcome: Progressing Goal: Cardiovascular complication will be avoided Outcome: Progressing   Problem: Activity: Goal: Risk for activity intolerance will decrease Outcome: Progressing   Problem: Elimination: Goal: Will not experience complications related to urinary retention Outcome: Progressing   

## 2021-09-02 NOTE — Hospital Course (Addendum)
Jaime Gomez is a 56 y.o. female with past medical history significant of metastatic endometrial cancer, DVT, PE, anemia, neutropenia, CKD 3-4, hypertension, gastroparesis, diabetes presented to hospital with complaints of dizziness, lightheadedness.  Patient with known metastatic endometrial cancer and is established with oncology. In the ED, vitals were stable.  Sodium was 133, potassium of 5.1, bicarb of 8 with anion gap of 21 and BUN of 123.  Creatinine was elevated at  Creatinine elevated to 8.45 up from 5.75, almost 2 weeks ago and around 3.0 in April.  Respiratory panel for flu and COVID-negative.  Urinalysis with glucose, hemoglobin, ketones, protein, leukocytes, few bacteria. Chest x-ray showed no acute normality.  CT head showed no acute abnormality.  CT renal stone study showed new left emphysematous pyelitis, evidence of cystitis, stable moderate right hydronephrosis and improved to mild left hydronephrosis with small stones present in the kidneys.  Gallstones also noted.  Urology was consulted in ED and recommended left stent placement as source of AKI thought to be obstructive and also recommend urinalysis and ceftriaxone with transfer to [facility] for cystoscopy and intervention.  Urology was unable to left-sided ureteric intervention so IR was consulted for percutaneous nephrostomy.  Patient underwent  percutaneous nephrostomy on the left side on 09/03/2021.    Following conditions were addressed during hospitalization as listed in the assessment and plan,.

## 2021-09-02 NOTE — Assessment & Plan Note (Addendum)
Latest hemoglobin of 7.7 monitor as outpatient.

## 2021-09-02 NOTE — Assessment & Plan Note (Addendum)
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  Has significantly improved at this time.  Creatinine at 5.6 today almost at baseline.  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  Patient underwent percutaneous nephrostomy placement by IR on 09/03/2021.

## 2021-09-02 NOTE — Progress Notes (Addendum)
PROGRESS NOTE    Jaime Gomez  BMW:[id number] DOB: 1965/11/07 DOA: 09/01/2021 PCP: Darvin Neighbours, FNP    Brief Narrative:  Jaime Gomez is a 56 y.o. female with past medical history significant of metastatic endometrial cancer, DVT, PE, anemia, neutropenia, CKD 3-4, hypertension, gastroparesis, diabetes presented to hospital with complaints of dizziness lightheadedness.  Patient with known metastatic endometrial cancer and is established with oncology. In the ED, vitals were stable.  Sodium was 133, potassium of 5.1, bicarb of 8 with anion gap of 21 and BUN of 123.  Creatinine was elevated at  Creatinine elevated to 8.45 up from 5.75, almost 2 weeks ago and around 3 in April.  Respiratory panel for flu and COVID-negative.  Urinalysis with glucose, hemoglobin, ketones, protein, leukocytes, few bacteria. Chest x-ray showed no acute normality.  CT head showed no acute abnormality.  CT renal stone study showed new left emphysematous pyelitis, evidence of cystitis, stable moderate right hydronephrosis and improved to mild left hydronephrosis with small stones present in the kidneys.  Gallstones also noted.  Urology was consulted in ED and recommended left stent placement as source of AKI thought to be obstructive and also recommend urinalysis and ceftriaxone with transfer to [facility] for cystoscopy and intervention.  Patient's oncologist also consulted who confirmed patient is full code but would not like to have dialysis.     Assessment and Plan: * AKI (acute kidney injury) ([place])- (present on admission) On CKD stage IIIa.  With acute component at this time due to obstructive uropathy.  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  Continue to hold Eliquis.  Foley catheter has been removed this morning.  Patient does not wish to go for  dialysis and not uremic at this time.  Latest creatinine of 7.6 from 8.4.  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  Seen on CT scan abdomen pelvis on 09/01/2021.  Follow urine cultures.  On broad-spectrum antibiotic at this time.  No fever.  Mild pancytopenia noted.  We will continue to monitor.  Endometrial cancer, FIGO stage IVB ([place])- (present on admission) Being followed by oncology as outpatient.  PET scan done which was positive in the lungs as well.  Continue letrozole and everolimus.  Hyperkalemia Borderline high.  We will continue to monitor.  Check BMP in p.m.  Avoid high potassium diet.  Personal history of other venous thrombosis and embolism Was on Eliquis as outpatient and has not had one since 08/31/2021.  We will continue to hold due to need for intervention.  Essential (primary) hypertension- (present on admission) Hold Coreg.  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 failure- (present on admission) Latest hemoglobin of 8.5 we will continue to monitor.  No evidence of bleeding.    DVT prophylaxis:   Hold Eliquis   Code Status:     Code Status: Full Code  Disposition: Home Status is: Inpatient Remains inpatient appropriate because: Obstructive uropathy, need for percutaneous nephrostomy    Family Communication: Spoke with the patient at bedside  Consultants:  Urology Interventional radiology Oncology  Procedures:  Cystoscopy with right retrograde pyelogram and ureteral stent placement by Dr. Rexene Alberts urology on 09/01/2021.  Antimicrobials:  Rocephin IV 09/01/21>  Anti-infectives (From admission, onward)    Start     Dose/Rate Route Frequency Ordered Stop   09/02/21 1400  cefTRIAXone (ROCEPHIN) 1 g  in sodium chloride 0.9 % 100 mL IVPB        1 g 200 mL/hr over 30 Minutes Intravenous Every 24 hours 09/01/21 2140     09/01/21 1445  cefTRIAXone (ROCEPHIN) 1 g in sodium chloride 0.9 % 100 mL IVPB        1 g 200 mL/hr over 30 Minutes Intravenous  Once 09/01/21 1442 09/01/21  1517        Subjective: Today, patient was seen and examined at bedside.  Patient had some bladder spasms and discomfort this morning but Foley catheter has been removed.  Feels weak but denies any nausea vomiting fever chills or shortness of breath.  Objective: Vitals:   09/01/21 2200 09/02/21 0307 09/02/21 0926 09/02/21 1239  BP:  128/77 111/76 130/85  Pulse:  (!) 56 71 60  Resp:  16 16 16   Temp:  97.6 F (36.4 C)    TempSrc:  Oral    SpO2:  100% 98% 100%  Weight: 64.4 kg     Height: 5\' 5"  (1.651 m)       Intake/Output Summary (Last 24 hours) at 09/02/2021 1314 Last data filed at 09/02/2021 0600 Gross per 24 hour  Intake 2199.3 ml  Output 425 ml  Net 1774.3 ml   Filed Weights   09/01/21 1027 09/01/21 2200  Weight: 66.7 kg 64.4 kg   Body mass index is 23.63 kg/m.   Physical Examination:  General:  Average built, not in obvious distress HENT:   Pallor noted.  Oral mucosa is moist.  Chest:  Clear breath sounds.  Diminished breath sounds bilaterally. No crackles or wheezes.  CVS: S1 &S2 heard. No murmur.  Regular rate and rhythm. Abdomen: Soft, nontender, nondistended.  Bowel sounds are heard.   Extremities: No cyanosis, clubbing or edema.  Peripheral pulses are palpable. Psych: Alert, awake and oriented, normal mood CNS:  No cranial nerve deficits.  Power equal in all extremities.   Skin: Warm and dry.  No rashes noted.  Data Reviewed:   CBC: Recent Labs  Lab 09/01/21 1044 09/02/21 0443  WBC 6.1 1.8*  NEUTROABS 5.7  --   HGB 9.9* 8.5*  HCT 30.3* 26.5*  MCV 88.3 90.1  PLT 179 111*    Basic Metabolic Panel: Recent Labs  Lab 09/01/21 1044 09/02/21 0443  NA 133* 139  K 5.1 5.2*  CL 104 113*  CO2 8* 11*  GLUCOSE 217* 169*  BUN 122* 118*  CREATININE 8.45* 7.61*  CALCIUM 8.9 8.0*  PHOS  --  11.3*    Liver Function Tests: Recent Labs  Lab 09/01/21 1044 09/02/21 0443  AST 144*  --   ALT 27  --   ALKPHOS 84  --   BILITOT 0.9  --   PROT 7.9   --   ALBUMIN 3.0* 2.4*     Radiology Studies: CT Head Wo Contrast  Result Date: 09/01/2021 CLINICAL DATA:  Mental status change, unknown cause, cervical cancer EXAM: CT HEAD WITHOUT CONTRAST TECHNIQUE: Contiguous axial images were obtained from the base of the skull through the vertex without intravenous contrast. RADIATION DOSE REDUCTION: This exam was performed according to the departmental dose-optimization program which includes automated exposure control, adjustment of the mA and/or kV according to patient size and/or use of iterative reconstruction technique. COMPARISON:  None. FINDINGS: Brain: There is no acute intracranial hemorrhage, mass effect, or edema. Gray-white differentiation is preserved. There is no extra-axial fluid collection. Ventricles and sulci are within normal limits in size and configuration.  Vascular: There is atherosclerotic calcification at the skull base. Skull: Calvarium is unremarkable. Sinuses/Orbits: No acute finding. Other: None. IMPRESSION: No acute intracranial abnormality. Electronically Signed   By: Macy Mis M.D.   On: 09/01/2021 12:46   DG Chest Port 1 View  Result Date: 09/01/2021 CLINICAL DATA:  Generalized weakness, lightheadedness EXAM: PORTABLE CHEST 1 VIEW COMPARISON:  08/19/2021 FINDINGS: Left chest wall port catheter is in similar position. No new consolidation or edema. Left suprahilar patchy density is unchanged. No pleural effusion. Normal heart size. IMPRESSION: No acute process in the chest.  Nodules on PET CT are not well seen. Electronically Signed   By: Macy Mis M.D.   On: 09/01/2021 12:35   DG C-Arm 1-60 Min-No Report  Result Date: 09/01/2021 Fluoroscopy was utilized by the requesting physician.  No radiographic interpretation.   CT Renal Stone Study  Result Date: 09/01/2021 CLINICAL DATA:  Kidney pain. Hematuria. History of cervical cancer. EXAM: CT ABDOMEN AND PELVIS WITHOUT CONTRAST TECHNIQUE: Multidetector CT imaging of the  abdomen and pelvis was performed following the standard protocol without IV contrast. RADIATION DOSE REDUCTION: This exam was performed according to the departmental dose-optimization program which includes automated exposure control, adjustment of the mA and/or kV according to patient size and/or use of iterative reconstruction technique. COMPARISON:  PET-CT dated August 24, 2021. FINDINGS: Lower chest: No acute abnormality. Hepatobiliary: No focal liver abnormality. Unchanged gallstone. No gallbladder wall thickening or biliary dilatation. Pancreas: Unremarkable. No pancreatic ductal dilatation or surrounding inflammatory changes. Spleen: Normal in size without focal abnormality. Adrenals/Urinary Tract: Adrenal glands are unremarkable. Unchanged moderate right and decreased now mild left hydroureteronephrosis. New foci of air within the left renal collecting system. Unchanged punctate calculus in the posterior right kidney upper pole. No ureteral calculi. Bilateral renovascular calcifications again noted. Significantly thick-walled, decompressed bladder. Stomach/Bowel: Stomach is within normal limits. Appendix appears normal. No evidence of bowel wall thickening, distention, or inflammatory changes. Vascular/Lymphatic: Aortic atherosclerosis. Infrarenal IVC filter again noted. No enlarged abdominal or pelvic lymph nodes. Reproductive: Status post hysterectomy. No adnexal masses. Other: No free fluid or pneumoperitoneum. Prior ventral hernia repair with unchanged mild anterior abdominal diastasis. Musculoskeletal: No acute or significant osseous findings. IMPRESSION: 1. New left emphysematous pyelitis. Unchanged moderate right and decreased now mild left hydroureteronephrosis. 2. Unchanged punctate right nephrolithiasis. 3. Significantly thick-walled, decompressed bladder, consistent with cystitis. 4. Unchanged cholelithiasis. 5. Aortic Atherosclerosis (ICD10-I70.0). Electronically Signed   By: Titus Dubin  M.D.   On: 09/01/2021 14:13      LOS: 1 day    Flora Lipps, MD [facility] 09/02/2021, 1:14 PM

## 2021-09-02 NOTE — Assessment & Plan Note (Addendum)
Eliquis has been resumed since 09/03/2021 after percutaneous nephrostomy.  Has a history of IVC filter placement.

## 2021-09-02 NOTE — Progress Notes (Signed)
I am absolutely amazed as to how quickly everybody helped with Jaime Gomez.  She actually underwent a cystoscopy last night.  I do appreciate urology's help.  It sounds like she is going need to need a left nephrostomy tube.  She has a lot of radiation cystitis which could be a problem.  Again, she looked a whole lot better than her "numbers "yesterday.  Today, her BUN is 118 creatinine 7.67.  Potassium is 5.2.  White cell count is 1.8.  I will go and give her dose of Neupogen to try to help with this.  Hemoglobin is 8.5.  Again, she looks good this morning.  She does not have any indication for dialysis.  Her phosphate is up quite a bit and 11.3.  We are still awaiting the cultures.  She was on Afinitor for when she came in.  This has been stopped for right now.  Again, I would like to hope that the renal function can be reversed.  I would think that this is possible.  She has had no cough.  Is no shortness of breath.  She has had no nausea or vomiting.  Her vital signs all look stable.  Temperature 97.6.  Pulse 56.  Blood pressure 128/77.  She does have a Port-A-Cath that is not functioning.  I would like to try to get this exchanged but I do not think this will happen if she has any active infection right now.  She is on Rocephin.  Again we will see what the cultures show.  I am just happy that she has such prompt attention.  I really appreciate urology's help.  I suspect Interventional Radiology will place a nephrostomy tube today.  I know that she will get outstanding care from the incredible staff upon 4 E.  Jaime Haw, MD  Psalms 27:14

## 2021-09-02 NOTE — Assessment & Plan Note (Addendum)
Being followed by oncology as outpatient.  PET scan done which was positive in the lungs as well.  Continue letrozole and everolimus.  Seen by oncology prior to discharge.

## 2021-09-02 NOTE — Assessment & Plan Note (Addendum)
On the left.  Seen on CT scan abdomen pelvis on 09/01/2021.  Urine culture with multiple species.  Received IV Rocephin during hospitalization.  No positive cultures so far but will complete 4 days more of antibiotic to complete 7-day course.

## 2021-09-02 NOTE — Assessment & Plan Note (Addendum)
Resume coreg from home on discharge.

## 2021-09-02 NOTE — Progress Notes (Signed)
Referring Physician(s): Gay,M  Supervising Physician: Mir, Sharen Heck  Patient Status:  Jaime Gomez - In-pt  Chief Complaint:  Emphysematous pyelitis with bilateral hydronephrosis  Subjective: Patient known to IR service from Port-A-Cath injection on 08/23/2021.  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  She was admitted to [facility] on 2/9 with dizziness/lightheadedness and acute on CKD. CT renal stone study showed new left emphysematous pyelitis, evidence of cystitis, stable moderate right hydronephrosis and improved to mild left hydronephrosis with small stones present in the kidneys.  Gallstones also noted.  Urology was consulted in ED and recommended left stent placement as source of AKI thought to be obstructive and also recommend urinalysis and ceftriaxone with transfer to [facility] for cystoscopy and intervention. She underwent right ureteral stent placement on 09/01/2021 ,however urology was unable to identify the left ureteral orifice given radiation cystitis bladder changes.  Request now received from urology for left percutaneous nephrostomy placement. She is afebrile.  Latest labs include potassium 5.2, creatinine 7.61, GFR less than 6, WBC 1.8, platelets 111k. PT/INR pending.  Urine culture pending. Covid  19 negative.  Last dose of Eliquis was on 2/8 AM.  She denies headache, chest pain, dyspnea, cough, back pain, nausea, vomiting.  She does have some slightly blood-tinged urine and some tenderness in abdominal region with palpation.  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 failure 08/18/2021   Diabetes mellitus without complication (HCC)    Endometrial cancer,  FIGO stage IVB ([place]) 08/18/2021   Goals of care, counseling/discussion 08/18/2021   History of kidney stones    Hypertension    Leg DVT (deep venous thromboembolism), chronic, right ([place]) 08/18/2021   Past Surgical History:  Procedure Laterality Date   ABDOMINAL HYSTERECTOMY     CYSTOSCOPY W/ URETERAL STENT PLACEMENT Bilateral 09/01/2021   Procedure: CYSTOSCOPY WITH RETROGRADE PYELOGRAM/URETERAL STENT PLACEMENT;  Surgeon: Janith Lima, MD;  Location: [facility] OR[facility];  Service: Urology;  Laterality: Bilateral;   HERNIA REPAIR     IR CV LINE INJECTION  08/23/2021     Allergies: Taxotere [docetaxel]  Medications: Prior to Admission medications   Medication Sig Start Date End Date Taking? Authorizing Provider  apixaban (ELIQUIS) 2.5 MG TABS tablet Take 2.5 mg by mouth 2 (two) times daily.   Yes [provider]  atorvastatin (LIPITOR) 10 MG tablet Take 10 mg by mouth daily at 6 (six) AM. 01/19/21  Yes [provider]  carvedilol (COREG) 25 MG tablet Take 25 mg by mouth daily at 6 (six) AM.   Yes [provider]  everolimus (AFINITOR) 10 MG tablet Take 10 mg by mouth daily at 6 (six) AM. 08/18/20  Yes [provider]  folic acid (FOLVITE) 1 MG tablet Take 1 mg by mouth daily at 6 (six) AM. 10/11/20  Yes [provider]  gabapentin (NEURONTIN) 100 MG capsule Take 100 mg by mouth 2 (two) times daily. 08/18/20  Yes [provider]  insulin aspart protamine - aspart (NOVOLOG MIX 70/30 FLEXPEN) (70-30) 100 UNIT/ML FlexPen Inject 20 Units into the skin as directed. Per meal 01/19/21  Yes [provider]  insulin detemir (LEVEMIR) 100 UNIT/ML injection Inject 20 Units into the skin 2 (two) times daily. 01/19/21  Yes [provider]  letrozole [facility]) 2.5  MG tablet Take 2.5 mg by mouth daily at 6 (six) AM. 05/12/21  Yes [provider]  mirabegron ER (MYRBETRIQ) 25 MG TB24 tablet Take 1 tablet (25 mg total) by mouth daily for 360  doses. 09/02/21 08/28/22 Yes Janith Lima, MD  nitrofurantoin, macrocrystal-monohydrate, (MACROBID) 100 MG capsule Take 100 mg by mouth 2 (two) times daily. 10/04/20  Yes [provider]  oxycodone (OXY-IR) 5 MG capsule Take 1 capsule (5 mg total) by mouth every 4 (four) hours as needed. 08/22/21  Yes [name], MD  pravastatin (PRAVACHOL) 80 MG tablet Take 80 mg by mouth daily at 6 (six) AM.   Yes [provider]  Insulin Aspart (NOVOLOG FLEXPEN [place]) Inject 20 Units into the skin as directed. Per meal    [provider]  ondansetron (ZOFRAN) 8 MG tablet Take 8 mg by mouth every 8 (eight) hours as needed. 08/18/20   [provider]  pantoprazole (PROTONIX) 40 MG tablet Take 40 mg by mouth in the morning. 10/15/20   [provider]     Vital Signs: BP 130/85 (BP Location: Left Arm)    Pulse 60    Temp 97.6 F (36.4 C) (Oral)    Resp 16    Ht 5\' 5"  (1.651 m)    Wt 141 lb 15.6 oz (64.4 kg)    SpO2 100%    BMI 23.63 kg/m   Physical Exam awake, alert.  Chest clear to auscultation bilaterally.  Left chest wall Port-A-Cath.  Heart with regular rate and rhythm; abdomen soft, positive bowel sounds, some mild generalized tenderness to palpation.  No significant lower extremity edema.  Imaging: CT Head Wo Contrast  Result Date: 09/01/2021 CLINICAL DATA:  Mental status change, unknown cause, cervical cancer EXAM: CT HEAD WITHOUT CONTRAST TECHNIQUE: Contiguous axial images were obtained from the base of the skull through the vertex without intravenous contrast. RADIATION DOSE REDUCTION: This exam was performed according to the departmental dose-optimization program which includes automated exposure control, adjustment of the mA and/or kV according to patient size and/or use of iterative reconstruction technique. COMPARISON:  None. FINDINGS: Brain: There is no acute intracranial hemorrhage, mass effect, or edema. Gray-white differentiation is preserved. There is no  extra-axial fluid collection. Ventricles and sulci are within normal limits in size and configuration. Vascular: There is atherosclerotic calcification at the skull base. Skull: Calvarium is unremarkable. Sinuses/Orbits: No acute finding. Other: None. IMPRESSION: No acute intracranial abnormality. Electronically Signed   By: Macy Mis M.D.   On: 09/01/2021 12:46   DG Chest Port 1 View  Result Date: 09/01/2021 CLINICAL DATA:  Generalized weakness, lightheadedness EXAM: PORTABLE CHEST 1 VIEW COMPARISON:  08/19/2021 FINDINGS: Left chest wall port catheter is in similar position. No new consolidation or edema. Left suprahilar patchy density is unchanged. No pleural effusion. Normal heart size. IMPRESSION: No acute process in the chest.  Nodules on PET CT are not well seen. Electronically Signed   By: Macy Mis M.D.   On: 09/01/2021 12:35   DG C-Arm 1-60 Min-No Report  Result Date: 09/01/2021 Fluoroscopy was utilized by the requesting physician.  No radiographic interpretation.   CT Renal Stone Study  Result Date: 09/01/2021 CLINICAL DATA:  Kidney pain. Hematuria. History of cervical cancer. EXAM: CT ABDOMEN AND PELVIS WITHOUT CONTRAST TECHNIQUE: Multidetector CT imaging of the abdomen and pelvis was performed following the standard protocol without IV contrast. RADIATION DOSE REDUCTION: This exam was performed according to the departmental dose-optimization program which includes automated  exposure control, adjustment of the mA and/or kV according to patient size and/or use of iterative reconstruction technique. COMPARISON:  PET-CT dated August 24, 2021. FINDINGS: Lower chest: No acute abnormality. Hepatobiliary: No focal liver abnormality. Unchanged gallstone. No gallbladder wall thickening or biliary dilatation. Pancreas: Unremarkable. No pancreatic ductal dilatation or surrounding inflammatory changes. Spleen: Normal in size without focal abnormality. Adrenals/Urinary Tract: Adrenal glands are  unremarkable. Unchanged moderate right and decreased now mild left hydroureteronephrosis. New foci of air within the left renal collecting system. Unchanged punctate calculus in the posterior right kidney upper pole. No ureteral calculi. Bilateral renovascular calcifications again noted. Significantly thick-walled, decompressed bladder. Stomach/Bowel: Stomach is within normal limits. Appendix appears normal. No evidence of bowel wall thickening, distention, or inflammatory changes. Vascular/Lymphatic: Aortic atherosclerosis. Infrarenal IVC filter again noted. No enlarged abdominal or pelvic lymph nodes. Reproductive: Status post hysterectomy. No adnexal masses. Other: No free fluid or pneumoperitoneum. Prior ventral hernia repair with unchanged mild anterior abdominal diastasis. Musculoskeletal: No acute or significant osseous findings. IMPRESSION: 1. New left emphysematous pyelitis. Unchanged moderate right and decreased now mild left hydroureteronephrosis. 2. Unchanged punctate right nephrolithiasis. 3. Significantly thick-walled, decompressed bladder, consistent with cystitis. 4. Unchanged cholelithiasis. 5. Aortic Atherosclerosis (ICD10-I70.0). Electronically Signed   By: Titus Dubin M.D.   On: 09/01/2021 14:13    Labs:  CBC: Recent Labs    08/18/21 1046 09/01/21 1044 09/02/21 0443  WBC 4.5 6.1 1.8*  HGB 6.9* 9.9* 8.5*  HCT 22.0* 30.3* 26.5*  PLT 183 179 111*    COAGS: No results for input(s): INR, APTT in the last 8760 hours.  BMP: Recent Labs    08/18/21 1046 09/01/21 1044 09/02/21 0443  NA 139 133* 139  K 4.4 5.1 5.2*  CL 107 104 113*  CO2 18* 8* 11*  GLUCOSE 171* 217* 169*  BUN 83* 122* 118*  CALCIUM 10.0 8.9 8.0*  CREATININE 5.75* 8.45* 7.61*  GFRNONAA 8* 5* 6*    LIVER FUNCTION TESTS: Recent Labs    08/18/21 1046 09/01/21 1044 09/02/21 0443  BILITOT 0.3 0.9  --   AST 7* 144*  --   ALT <5 27  --   ALKPHOS 61 84  --   PROT 7.8 7.9  --   ALBUMIN 4.0 3.0* 2.4*     Assessment and Plan: Patient known to IR service from Port-A-Cath injection on 08/23/2021.  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  She was admitted to [facility] on 2/9 with dizziness/lightheadedness and acute on CKD. CT renal stone study showed new left emphysematous pyelitis, evidence of cystitis, stable moderate right hydronephrosis and improved to mild left hydronephrosis with small stones present in the kidneys.  Gallstones also noted.  Urology was consulted in ED and recommended left stent placement as source of AKI thought to be obstructive and also recommend urinalysis and ceftriaxone with transfer to [facility] for cystoscopy and intervention. She underwent right ureteral stent placement on 09/01/2021 ,however urology was unable to identify the left ureteral orifice given radiation cystitis bladder changes.  Request now received from urology for left percutaneous nephrostomy placement. She is afebrile.  Latest labs include potassium 5.2, creatinine 7.61, GFR less than 6, WBC 1.8, platelets 111k. PT/INR pending.  Urine culture pending. Covid  19 negative.  Last dose of Eliquis was  on 2/8 AM.  Imaging studies were reviewed by Dr. Dwaine Gale.Risks and benefits of left  PCN placement was discussed with the patient /sister including, but not limited to, infection, bleeding, significant bleeding causing loss or decrease in renal function or damage to adjacent structures.   All of the patient's questions were answered, patient is agreeable to proceed.  Consent signed and in chart.  Procedure tentatively planned for 2/11 AM      Electronically Signed: D. Rowe Robert, PA-C 09/02/2021, 2:05 PM   I  spent a total of 25 Minutes at the the patient's bedside AND on the patient's hospital floor or unit, greater than 50% of which was counseling/coordinating care for left percutaneous nephrostomy    Patient ID: Jaime Gomez, female   DOB: [birth year], 56 y.o.   MRN: [id number]

## 2021-09-02 NOTE — Progress Notes (Signed)
1 Day Post-Op Subjective: Pain controlled. Denies left flank pain. Has been having some bladder spasms. Hematuria resolving.   Objective: Vital signs in last 24 hours: Temp:  [97.6 F (36.4 C)] 97.6 F (36.4 C) (02/10 0307) Pulse Rate:  [56-105] 71 (02/10 0926) Resp:  [9-17] 16 (02/10 0926) BP: (100-136)/(64-90) 111/76 (02/10 0926) SpO2:  [95 %-100 %] 98 % (02/10 0926) Weight:  [64.4 kg] 64.4 kg (02/09 2200)  Intake/Output from previous day: 02/09 0701 - 02/10 0700 In: 2199.3 [I.V.:1500; IV Piggyback:699.3] Out: 425 [Urine:425] Intake/Output this shift: No intake/output data recorded.  Physical Exam:  General: Alert and oriented CV: RRR Lungs: Clear Abdomen: Soft, ND, NT Ext: NT, No erythema  Lab Results: Recent Labs    09/01/21 1044 09/02/21 0443  HGB 9.9* 8.5*  HCT 30.3* 26.5*   BMET Recent Labs    09/01/21 1044 09/02/21 0443  NA 133* 139  K 5.1 5.2*  CL 104 113*  CO2 8* 11*  GLUCOSE 217* 169*  BUN 122* 118*  CREATININE 8.45* 7.61*  CALCIUM 8.9 8.0*     Studies/Results: CT Head Wo Contrast  Result Date: 09/01/2021 CLINICAL DATA:  Mental status change, unknown cause, cervical cancer EXAM: CT HEAD WITHOUT CONTRAST TECHNIQUE: Contiguous axial images were obtained from the base of the skull through the vertex without intravenous contrast. RADIATION DOSE REDUCTION: This exam was performed according to the departmental dose-optimization program which includes automated exposure control, adjustment of the mA and/or kV according to patient size and/or use of iterative reconstruction technique. COMPARISON:  None. FINDINGS: Brain: There is no acute intracranial hemorrhage, mass effect, or edema. Gray-white differentiation is preserved. There is no extra-axial fluid collection. Ventricles and sulci are within normal limits in size and configuration. Vascular: There is atherosclerotic calcification at the skull base. Skull: Calvarium is unremarkable. Sinuses/Orbits: No  acute finding. Other: None. IMPRESSION: No acute intracranial abnormality. Electronically Signed   By: Macy Mis M.D.   On: 09/01/2021 12:46   DG Chest Port 1 View  Result Date: 09/01/2021 CLINICAL DATA:  Generalized weakness, lightheadedness EXAM: PORTABLE CHEST 1 VIEW COMPARISON:  08/19/2021 FINDINGS: Left chest wall port catheter is in similar position. No new consolidation or edema. Left suprahilar patchy density is unchanged. No pleural effusion. Normal heart size. IMPRESSION: No acute process in the chest.  Nodules on PET CT are not well seen. Electronically Signed   By: Macy Mis M.D.   On: 09/01/2021 12:35   DG C-Arm 1-60 Min-No Report  Result Date: 09/01/2021 Fluoroscopy was utilized by the requesting physician.  No radiographic interpretation.   CT Renal Stone Study  Result Date: 09/01/2021 CLINICAL DATA:  Kidney pain. Hematuria. History of cervical cancer. EXAM: CT ABDOMEN AND PELVIS WITHOUT CONTRAST TECHNIQUE: Multidetector CT imaging of the abdomen and pelvis was performed following the standard protocol without IV contrast. RADIATION DOSE REDUCTION: This exam was performed according to the departmental dose-optimization program which includes automated exposure control, adjustment of the mA and/or kV according to patient size and/or use of iterative reconstruction technique. COMPARISON:  PET-CT dated August 24, 2021. FINDINGS: Lower chest: No acute abnormality. Hepatobiliary: No focal liver abnormality. Unchanged gallstone. No gallbladder wall thickening or biliary dilatation. Pancreas: Unremarkable. No pancreatic ductal dilatation or surrounding inflammatory changes. Spleen: Normal in size without focal abnormality. Adrenals/Urinary Tract: Adrenal glands are unremarkable. Unchanged moderate right and decreased now mild left hydroureteronephrosis. New foci of air within the left renal collecting system. Unchanged punctate calculus in the posterior right kidney upper pole.  No  ureteral calculi. Bilateral renovascular calcifications again noted. Significantly thick-walled, decompressed bladder. Stomach/Bowel: Stomach is within normal limits. Appendix appears normal. No evidence of bowel wall thickening, distention, or inflammatory changes. Vascular/Lymphatic: Aortic atherosclerosis. Infrarenal IVC filter again noted. No enlarged abdominal or pelvic lymph nodes. Reproductive: Status post hysterectomy. No adnexal masses. Other: No free fluid or pneumoperitoneum. Prior ventral hernia repair with unchanged mild anterior abdominal diastasis. Musculoskeletal: No acute or significant osseous findings. IMPRESSION: 1. New left emphysematous pyelitis. Unchanged moderate right and decreased now mild left hydroureteronephrosis. 2. Unchanged punctate right nephrolithiasis. 3. Significantly thick-walled, decompressed bladder, consistent with cystitis. 4. Unchanged cholelithiasis. 5. Aortic Atherosclerosis (ICD10-I70.0). Electronically Signed   By: Titus Dubin M.D.   On: 09/01/2021 14:13    Assessment/Plan: Left emphysematous pyelitis: Seen on CT A/P 09/01/2021.  Urine culture pending. 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  She was previously managed with bilateral ureteral stents/nephrostomy tubes since 2021 in [place]. S/p R ureteral stent 09/01/2021. AKI in CKD: Creatinine was 8.45 on 09/01/2021 from 5.75 in January 2023 and 3 in April/2022 Metastatic adenocarcinoma of the endometrium/ovary  -She underwent right ureteral stent placement last night.  Unfortunately, was unable to identify her left ureteral orifice given radiation cystitis bladder changes.  I consulted interventional radiology to place left percutaneous nephrostomy tube.  She does have history of left lower extremity DVT and has been taking Eliquis.  This is being held.  She will plan to undergo left nephrostomy tube on Sunday assuming she does not become febrile or  ill prior. -I arranged for outpatient follow-up seen in the office to plan her nephrostomy tube exchanges for internalization for ureteral stents. -Ordered anticholinergics and B&O in house. Myrbetriq at discharge. -Following peripherally.   LOS: 1 day   Matt R. Elizabethann Lackey MD 09/02/2021, 10:43 AM [facility]  Pager: [phone]

## 2021-09-03 ENCOUNTER — Inpatient Hospital Stay (HOSPITAL_COMMUNITY): Payer: BC Managed Care – PPO

## 2021-09-03 DIAGNOSIS — N179 Acute kidney failure, unspecified: Secondary | ICD-10-CM | POA: Diagnosis not present

## 2021-09-03 DIAGNOSIS — I82401 Acute embolism and thrombosis of unspecified deep veins of right lower extremity: Secondary | ICD-10-CM | POA: Diagnosis not present

## 2021-09-03 DIAGNOSIS — C541 Malignant neoplasm of endometrium: Secondary | ICD-10-CM | POA: Diagnosis not present

## 2021-09-03 DIAGNOSIS — N189 Chronic kidney disease, unspecified: Secondary | ICD-10-CM | POA: Diagnosis not present

## 2021-09-03 DIAGNOSIS — N12 Tubulo-interstitial nephritis, not specified as acute or chronic: Secondary | ICD-10-CM | POA: Diagnosis not present

## 2021-09-03 HISTORY — PX: IR NEPHROSTOMY PLACEMENT LEFT: IMG6063

## 2021-09-03 LAB — PHOSPHORUS: Phosphorus: 11.4 mg/dL — ABNORMAL HIGH (ref 2.5–4.6)

## 2021-09-03 LAB — CBC WITH DIFFERENTIAL/PLATELET
Abs Immature Granulocytes: 0.28 10*3/uL — ABNORMAL HIGH (ref 0.00–0.07)
Basophils Absolute: 0 10*3/uL (ref 0.0–0.1)
Basophils Relative: 0 %
Eosinophils Absolute: 0 10*3/uL (ref 0.0–0.5)
Eosinophils Relative: 0 %
HCT: 30.1 % — ABNORMAL LOW (ref 36.0–46.0)
Hemoglobin: 9.8 g/dL — ABNORMAL LOW (ref 12.0–15.0)
Immature Granulocytes: 2 %
Lymphocytes Relative: 3 %
Lymphs Abs: 0.4 10*3/uL — ABNORMAL LOW (ref 0.7–4.0)
MCH: 29 pg (ref 26.0–34.0)
MCHC: 32.6 g/dL (ref 30.0–36.0)
MCV: 89.1 fL (ref 80.0–100.0)
Monocytes Absolute: 0.4 10*3/uL (ref 0.1–1.0)
Monocytes Relative: 3 %
Neutro Abs: 11.3 10*3/uL — ABNORMAL HIGH (ref 1.7–7.7)
Neutrophils Relative %: 92 %
Platelets: 147 10*3/uL — ABNORMAL LOW (ref 150–400)
RBC: 3.38 MIL/uL — ABNORMAL LOW (ref 3.87–5.11)
RDW: 16 % — ABNORMAL HIGH (ref 11.5–15.5)
WBC: 12.4 10*3/uL — ABNORMAL HIGH (ref 4.0–10.5)
nRBC: 0 % (ref 0.0–0.2)

## 2021-09-03 LAB — BASIC METABOLIC PANEL
Anion gap: 16 — ABNORMAL HIGH (ref 5–15)
BUN: 123 mg/dL — ABNORMAL HIGH (ref 6–20)
CO2: 11 mmol/L — ABNORMAL LOW (ref 22–32)
Calcium: 7.9 mg/dL — ABNORMAL LOW (ref 8.9–10.3)
Chloride: 113 mmol/L — ABNORMAL HIGH (ref 98–111)
Creatinine, Ser: 7.17 mg/dL — ABNORMAL HIGH (ref 0.44–1.00)
GFR, Estimated: 6 mL/min — ABNORMAL LOW (ref >60)
Glucose, Bld: 242 mg/dL — ABNORMAL HIGH (ref 70–99)
Potassium: 4.4 mmol/L (ref 3.5–5.1)
Sodium: 140 mmol/L (ref 135–145)

## 2021-09-03 LAB — MAGNESIUM: Magnesium: 2 mg/dL (ref 1.7–2.4)

## 2021-09-03 LAB — URINE CULTURE

## 2021-09-03 LAB — PROTIME-INR
INR: 1.3 — ABNORMAL HIGH (ref 0.8–1.2)
Prothrombin Time: 16.6 seconds — ABNORMAL HIGH (ref 11.4–15.2)

## 2021-09-03 LAB — GLUCOSE, CAPILLARY
Glucose-Capillary: 206 mg/dL — ABNORMAL HIGH (ref 70–99)
Glucose-Capillary: 137 mg/dL — ABNORMAL HIGH (ref 70–99)
Glucose-Capillary: 174 mg/dL — ABNORMAL HIGH (ref 70–99)
Glucose-Capillary: 113 mg/dL — ABNORMAL HIGH (ref 70–99)

## 2021-09-03 MED ORDER — FENTANYL CITRATE (PF) 100 MCG/2ML IJ SOLN
INTRAMUSCULAR | Status: AC | PRN
  Administered 2021-09-03: 50 ug via INTRAVENOUS

## 2021-09-03 MED ORDER — IOHEXOL 350 MG/ML SOLN
5.0000 mL | Freq: Once | INTRAVENOUS | Status: AC | PRN
Start: 2021-09-03 — End: 2021-09-03
  Administered 2021-09-03: 5 mL

## 2021-09-03 MED ORDER — FENTANYL CITRATE (PF) 100 MCG/2ML IJ SOLN
INTRAMUSCULAR | Status: AC
  Filled 2021-09-03: qty 2

## 2021-09-03 MED ORDER — LIDOCAINE HCL 1 % IJ SOLN
INTRAMUSCULAR | Status: AC
  Administered 2021-09-03: 5 mL
  Filled 2021-09-03: qty 20

## 2021-09-03 MED ORDER — MIDAZOLAM HCL 2 MG/2ML IJ SOLN
INTRAMUSCULAR | Status: AC | PRN
  Administered 2021-09-03: 1 mg via INTRAVENOUS

## 2021-09-03 MED ORDER — MIDAZOLAM HCL 2 MG/2ML IJ SOLN
INTRAMUSCULAR | Status: AC
  Filled 2021-09-03: qty 2

## 2021-09-03 MED ORDER — SODIUM CHLORIDE 0.9% FLUSH
5.0000 mL | Freq: Three times a day (TID) | INTRAVENOUS | Status: DC
  Administered 2021-09-03 – 2021-09-05 (×5): 5 mL

## 2021-09-03 MED ORDER — APIXABAN 2.5 MG PO TABS
2.5000 mg | ORAL_TABLET | Freq: Two times a day (BID) | ORAL | Status: DC
  Administered 2021-09-03 – 2021-09-04 (×3): 2.5 mg via ORAL
  Filled 2021-09-03 (×3): qty 1

## 2021-09-03 NOTE — Progress Notes (Signed)
°   09/03/21 1400  Mobility  Activity Ambulated with assistance in hallway  Level of Assistance Standby assist, set-up cues, supervision of patient - no hands on  Assistive Device None  Distance Ambulated (ft) 300 ft  Activity Response Tolerated well  $Mobility charge 1 Mobility   Pt agreeable to mobilize this afternoon. Ambulated about 355ft in hall with no AD, tolerated well. Required verbal queues for drifting. No complaints. Left pt in bed, call bell at side. RN/NT notified of session.   [place] [facility] Office: [phone]

## 2021-09-03 NOTE — Assessment & Plan Note (Addendum)
Currently acute kidney injury from obstructive uropathy.  Creatinine has slightly improved today.  Will need outpatient follow-up.

## 2021-09-03 NOTE — Progress Notes (Signed)
Jaime Gomez feels a bit better this morning.  She will have the nephrostomy tube placed today.  She is still making urine.  Cultures not yet back.  She does not have any nausea or vomiting.  There is no fever.  There is no bleeding.  So far, the lab work is back yet today.  Her vital signs are temperature 97.6.  Pulse 80.  Blood pressure 124/80.  Her lungs are clear bilaterally.  Cardiac exam regular rate and rhythm.  Abdomen is soft.  Bowel sounds are present.  There is no guarding or rebound tenderness.  Extremity shows no clubbing, cyanosis or edema.  She is off anticoagulation right now.  This is in anticipation of the nephrostomy tube.  In the nephrostomy tube, she really needs to get back onto some anticoagulation.  I will know if heparin would be the way to go while she is in the hospital.  I know she is on Eliquis.  However, there may be more procedures that are going to be done so we may have to consider this.  It may not be a bad idea to check a Doppler of her right leg.  I know she has an IVC filter in.  It Will be interesting to see what her renal function shows.  I know that she is going to get incredible care from all the staff up on [address].  Lattie Haw, MD  [name] 29:11

## 2021-09-03 NOTE — Procedures (Signed)
Interventional Radiology Procedure Note  Procedure: Left percutaneous nephrostomy (10.2 fr)  Indication: Left hydronephrosis  Findings: Please refer to procedural dictation for full description.  Complications: None  EBL: < 10 mL  Miachel Roux, MD [phone]

## 2021-09-03 NOTE — Progress Notes (Signed)
PROGRESS NOTE    Jaime Gomez  XNA:[id number] DOB: [birth year] DOA: 09/01/2021 PCP: Darvin Neighbours, FNP    Brief Narrative:  Jaime Gomez is a 56 y.o. female with past medical history significant of metastatic endometrial cancer, DVT, PE, anemia, neutropenia, CKD 3-4, hypertension, gastroparesis, diabetes presented to hospital with complaints of dizziness lightheadedness.  Patient with known metastatic endometrial cancer and is established with oncology. In the ED, vitals were stable.  Sodium was 133, potassium of 5.1, bicarb of 8 with anion gap of 21 and BUN of 123.  Creatinine was elevated at  Creatinine elevated to 8.45 up from 5.75, almost 2 weeks ago and around 3 in April.  Respiratory panel for flu and COVID-negative.  Urinalysis with glucose, hemoglobin, ketones, protein, leukocytes, few bacteria. Chest x-ray showed no acute normality.  CT head showed no acute abnormality.  CT renal stone study showed new left emphysematous pyelitis, evidence of cystitis, stable moderate right hydronephrosis and improved to mild left hydronephrosis with small stones present in the kidneys.  Gallstones also noted.  Urology was consulted in ED and recommended left stent placement as source of AKI thought to be obstructive and also recommend urinalysis and ceftriaxone with transfer to [facility] for cystoscopy and intervention.  Urology was unable to left-sided ureteric intervention so IR has been consulted for percutaneous nephrostomy.  Currently awaiting for percutaneous nephrostomy on the left side after Eliquis washout since the patient was on Eliquis.      Assessment and Plan: * AKI (acute kidney injury) ([place])- (present on admission) On CKD stage IIIa.  With acute component at this time due to obstructive uropathy.  Patient has been seen by urology and underwent stent placement on the right side but was unable to do on the left so IR has been consulted for nephrostomy due to radiation cystitis and ureteral  orifice narrowing..  Continue to hold Eliquis.    Patient does not wish to go for dialysis and not uremic at this time.   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  Seen on CT scan abdomen pelvis on 09/01/2021.  Follow urine cultures.  On broad-spectrum antibiotic at this time.  No fever.  Mild pancytopenia noted.  We will continue to monitor.  Endometrial cancer, FIGO stage IVB ([place])- (present on admission) Being followed by oncology as outpatient.  PET scan done which was positive in the lungs as well.  Continue letrozole and everolimus.  Hyperkalemia Borderline high.  We will continue to monitor. Avoid high potassium diet.  Monitor BMP.  Type 2 diabetes mellitus without complications (HCC) On long-acting and sliding scale insulin.  Continue to monitor closely.  Personal history of other venous thrombosis and embolism Was on Eliquis as outpatient and has not had one since 08/31/2021.  We will continue to hold due to need for intervention today.  Lower extremity ultrasound was positive for partial DVT on the left leg.  Resume anticoagulation soon after procedure is done.  Has a history of IVC filter placement.  Essential (primary) hypertension- (present on admission) Hold Coreg.  Chronic kidney disease, stage 3a ([place])- (present on admission) Currently acute kidney injury from obstructive uropathy.  Continue to monitor closely.  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 failure- (present on admission) Latest hemoglobin of 8.5 we will continue to monitor.  No evidence of bleeding.    DVT prophylaxis:   Eliquis on hold anticipating procedure.   Code Status:     Code Status: Full Code  Disposition: Home  Status is: Inpatient  Remains  inpatient appropriate because: Obstructive uropathy, need for percutaneous nephrostomy   Family Communication: Spoke with the patient and the patient's sister at bedside.  Consultants:  Urology Interventional radiology Oncology  Procedures:  Cystoscopy with right  retrograde pyelogram and ureteral stent placement by Dr. Rexene Alberts urology on 09/01/2021.  Antimicrobials:  Rocephin IV 09/01/21>  Anti-infectives (From admission, onward)    Start     Dose/Rate Route Frequency Ordered Stop   09/02/21 1400  cefTRIAXone (ROCEPHIN) 1 g in sodium chloride 0.9 % 100 mL IVPB        1 g 200 mL/hr over 30 Minutes Intravenous Every 24 hours 09/01/21 2140     09/01/21 1445  cefTRIAXone (ROCEPHIN) 1 g in sodium chloride 0.9 % 100 mL IVPB        1 g 200 mL/hr over 30 Minutes Intravenous  Once 09/01/21 1442 09/01/21 1517      Subjective: Today, patient was seen and examined at bedside.  Denies any nausea vomiting fever chills or rigor.  Patient's sister at bedside.  Mild weakness noted.  Awaiting for IR intervention.    Objective: Vitals:   09/03/21 0528 09/03/21 0919 09/03/21 1053 09/03/21 1110  BP: 124/80 125/82 (!) 138/93 114/73  Pulse: 80 70 98 93  Resp: 16 18 15 17   Temp: 97.6 F (36.4 C) (!) 97.5 F (36.4 C)    TempSrc: Oral Oral    SpO2: 100% 100% 98% 100%  Weight:      Height:        Intake/Output Summary (Last 24 hours) at 09/03/2021 1112 Last data filed at 09/03/2021 0918 Gross per 24 hour  Intake 2699.87 ml  Output 100 ml  Net 2599.87 ml   Filed Weights   09/01/21 1027 09/01/21 2200  Weight: 66.7 kg 64.4 kg   Body mass index is 23.63 kg/m.   Physical Examination:  General:  Average built, not in obvious distress HENT: Pallor noted.. Oral mucosa is moist.  Chest:  Clear breath sounds.  Diminished breath sounds bilaterally. No crackles or wheezes.  CVS: S1 &S2 heard. No murmur.  Regular rate and rhythm. Abdomen: Soft, nontender, nondistended.  Bowel sounds are heard.   Extremities: No cyanosis, clubbing or edema.  Peripheral pulses are palpable. Psych: Alert, awake and oriented, normal mood CNS:  No cranial nerve deficits.  Power equal in all extremities.   Skin: Warm and dry.  No rashes noted.  Data Reviewed:   CBC: Recent  Labs  Lab 09/01/21 1044 09/02/21 0443 09/03/21 0727  WBC 6.1 1.8* 12.4*  NEUTROABS 5.7  --  11.3*  HGB 9.9* 8.5* 9.8*  HCT 30.3* 26.5* 30.1*  MCV 88.3 90.1 89.1  PLT 179 111* 147*    Basic Metabolic Panel: Recent Labs  Lab 09/01/21 1044 09/02/21 0443 09/03/21 0727  NA 133* 139 140  K 5.1 5.2* 4.4  CL 104 113* 113*  CO2 8* 11* 11*  GLUCOSE 217* 169* 242*  BUN 122* 118* 123*  CREATININE 8.45* 7.61* 7.17*  CALCIUM 8.9 8.0* 7.9*  MG  --   --  2.0  PHOS  --  11.3* 11.4*    Liver Function Tests: Recent Labs  Lab 09/01/21 1044 09/02/21 0443  AST 144*  --   ALT 27  --   ALKPHOS 84  --   BILITOT 0.9  --   PROT 7.9  --   ALBUMIN 3.0* 2.4*     Radiology Studies: CT Head Wo Contrast  Result Date: 09/01/2021 CLINICAL DATA:  Mental status change, unknown cause, cervical cancer EXAM: CT HEAD WITHOUT CONTRAST TECHNIQUE: Contiguous axial images were obtained from the base of the skull through the vertex without intravenous contrast. RADIATION DOSE REDUCTION: This exam was performed according to the departmental dose-optimization program which includes automated exposure control, adjustment of the mA and/or kV according to patient size and/or use of iterative reconstruction technique. COMPARISON:  None. FINDINGS: Brain: There is no acute intracranial hemorrhage, mass effect, or edema. Gray-white differentiation is preserved. There is no extra-axial fluid collection. Ventricles and sulci are within normal limits in size and configuration. Vascular: There is atherosclerotic calcification at the skull base. Skull: Calvarium is unremarkable. Sinuses/Orbits: No acute finding. Other: None. IMPRESSION: No acute intracranial abnormality. Electronically Signed   By: Macy Mis M.D.   On: 09/01/2021 12:46   DG Chest Port 1 View  Result Date: 09/01/2021 CLINICAL DATA:  Generalized weakness, lightheadedness EXAM: PORTABLE CHEST 1 VIEW COMPARISON:  08/19/2021 FINDINGS: Left chest wall port  catheter is in similar position. No new consolidation or edema. Left suprahilar patchy density is unchanged. No pleural effusion. Normal heart size. IMPRESSION: No acute process in the chest.  Nodules on PET CT are not well seen. Electronically Signed   By: Macy Mis M.D.   On: 09/01/2021 12:35   DG C-Arm 1-60 Min-No Report  Result Date: 09/01/2021 Fluoroscopy was utilized by the requesting physician.  No radiographic interpretation.   CT Renal Stone Study  Result Date: 09/01/2021 CLINICAL DATA:  Kidney pain. Hematuria. History of cervical cancer. EXAM: CT ABDOMEN AND PELVIS WITHOUT CONTRAST TECHNIQUE: Multidetector CT imaging of the abdomen and pelvis was performed following the standard protocol without IV contrast. RADIATION DOSE REDUCTION: This exam was performed according to the departmental dose-optimization program which includes automated exposure control, adjustment of the mA and/or kV according to patient size and/or use of iterative reconstruction technique. COMPARISON:  PET-CT dated August 24, 2021. FINDINGS: Lower chest: No acute abnormality. Hepatobiliary: No focal liver abnormality. Unchanged gallstone. No gallbladder wall thickening or biliary dilatation. Pancreas: Unremarkable. No pancreatic ductal dilatation or surrounding inflammatory changes. Spleen: Normal in size without focal abnormality. Adrenals/Urinary Tract: Adrenal glands are unremarkable. Unchanged moderate right and decreased now mild left hydroureteronephrosis. New foci of air within the left renal collecting system. Unchanged punctate calculus in the posterior right kidney upper pole. No ureteral calculi. Bilateral renovascular calcifications again noted. Significantly thick-walled, decompressed bladder. Stomach/Bowel: Stomach is within normal limits. Appendix appears normal. No evidence of bowel wall thickening, distention, or inflammatory changes. Vascular/Lymphatic: Aortic atherosclerosis. Infrarenal IVC filter again  noted. No enlarged abdominal or pelvic lymph nodes. Reproductive: Status post hysterectomy. No adnexal masses. Other: No free fluid or pneumoperitoneum. Prior ventral hernia repair with unchanged mild anterior abdominal diastasis. Musculoskeletal: No acute or significant osseous findings. IMPRESSION: 1. New left emphysematous pyelitis. Unchanged moderate right and decreased now mild left hydroureteronephrosis. 2. Unchanged punctate right nephrolithiasis. 3. Significantly thick-walled, decompressed bladder, consistent with cystitis. 4. Unchanged cholelithiasis. 5. Aortic Atherosclerosis (ICD10-I70.0). Electronically Signed   By: Titus Dubin M.D.   On: 09/01/2021 14:13      LOS: 2 days    Flora Lipps, MD [facility] 09/03/2021, 11:12 AM

## 2021-09-03 NOTE — Assessment & Plan Note (Addendum)
Controlled.  Resume home insulin regimen on discharge.

## 2021-09-04 DIAGNOSIS — N189 Chronic kidney disease, unspecified: Secondary | ICD-10-CM | POA: Diagnosis not present

## 2021-09-04 DIAGNOSIS — N12 Tubulo-interstitial nephritis, not specified as acute or chronic: Secondary | ICD-10-CM | POA: Diagnosis not present

## 2021-09-04 DIAGNOSIS — C541 Malignant neoplasm of endometrium: Secondary | ICD-10-CM | POA: Diagnosis not present

## 2021-09-04 DIAGNOSIS — N179 Acute kidney failure, unspecified: Secondary | ICD-10-CM | POA: Diagnosis not present

## 2021-09-04 LAB — CBC
HCT: 25.5 % — ABNORMAL LOW (ref 36.0–46.0)
Hemoglobin: 8.5 g/dL — ABNORMAL LOW (ref 12.0–15.0)
MCH: 29.6 pg (ref 26.0–34.0)
MCHC: 33.3 g/dL (ref 30.0–36.0)
MCV: 88.9 fL (ref 80.0–100.0)
Platelets: 114 10*3/uL — ABNORMAL LOW (ref 150–400)
RBC: 2.87 MIL/uL — ABNORMAL LOW (ref 3.87–5.11)
RDW: 16.2 % — ABNORMAL HIGH (ref 11.5–15.5)
WBC: 9.3 10*3/uL (ref 4.0–10.5)
nRBC: 0 % (ref 0.0–0.2)

## 2021-09-04 LAB — BASIC METABOLIC PANEL
Anion gap: 14 (ref 5–15)
BUN: 103 mg/dL — ABNORMAL HIGH (ref 6–20)
CO2: 10 mmol/L — ABNORMAL LOW (ref 22–32)
Calcium: 7.2 mg/dL — ABNORMAL LOW (ref 8.9–10.3)
Chloride: 116 mmol/L — ABNORMAL HIGH (ref 98–111)
Creatinine, Ser: 6.43 mg/dL — ABNORMAL HIGH (ref 0.44–1.00)
GFR, Estimated: 7 mL/min — ABNORMAL LOW (ref >60)
Glucose, Bld: 80 mg/dL (ref 70–99)
Potassium: 3.8 mmol/L (ref 3.5–5.1)
Sodium: 140 mmol/L (ref 135–145)

## 2021-09-04 LAB — GLUCOSE, CAPILLARY
Glucose-Capillary: 76 mg/dL (ref 70–99)
Glucose-Capillary: 117 mg/dL — ABNORMAL HIGH (ref 70–99)
Glucose-Capillary: 147 mg/dL — ABNORMAL HIGH (ref 70–99)
Glucose-Capillary: 103 mg/dL — ABNORMAL HIGH (ref 70–99)

## 2021-09-04 LAB — MAGNESIUM: Magnesium: 1.7 mg/dL (ref 1.7–2.4)

## 2021-09-04 NOTE — Progress Notes (Signed)
Supervising Physician: Mir, Biochemist, clinical  Patient Status:  [facility] - In-pt  Chief Complaint:  hydronephrosis  Subjective:  1 day s/p nephrostomy tube placement Feeling "much better" Sitting up at bedside Sister in room  Allergies: Taxotere [docetaxel]  Medications: Prior to Admission medications   Medication Sig Start Date End Date Taking? Authorizing Provider  apixaban (ELIQUIS) 2.5 MG TABS tablet Take 2.5 mg by mouth 2 (two) times daily.   Yes [provider]  atorvastatin (LIPITOR) 10 MG tablet Take 10 mg by mouth daily at 6 (six) AM. 01/19/21  Yes [provider]  carvedilol (COREG) 25 MG tablet Take 25 mg by mouth daily at 6 (six) AM.   Yes [provider]  everolimus (AFINITOR) 10 MG tablet Take 10 mg by mouth daily at 6 (six) AM. 08/18/20  Yes [provider]  folic acid (FOLVITE) 1 MG tablet Take 1 mg by mouth daily at 6 (six) AM. 10/11/20  Yes [provider]  gabapentin (NEURONTIN) 100 MG capsule Take 100 mg by mouth 2 (two) times daily. 08/18/20  Yes [provider]  insulin aspart protamine - aspart (NOVOLOG MIX 70/30 FLEXPEN) (70-30) 100 UNIT/ML FlexPen Inject 20 Units into the skin as directed. Per meal 01/19/21  Yes [provider]  insulin detemir (LEVEMIR) 100 UNIT/ML injection Inject 20 Units into the skin 2 (two) times daily. 01/19/21  Yes [provider]  letrozole (FEMARA) 2.5 MG tablet Take 2.5 mg by mouth daily at 6 (six) AM. 05/12/21  Yes [provider]  mirabegron ER (MYRBETRIQ) 25 MG TB24 tablet Take 1 tablet (25 mg total) by mouth daily for 360 doses. 09/02/21 08/28/22 Yes Janith Lima, MD  nitrofurantoin, macrocrystal-monohydrate, (MACROBID) 100 MG capsule Take 100 mg by mouth 2 (two) times daily. 10/04/20  Yes [provider]  ondansetron (ZOFRAN) 8 MG tablet Take 8 mg by mouth every 8 (eight) hours as needed. 08/18/20  Yes [provider]  oxycodone (OXY-IR) 5 MG  capsule Take 1 capsule (5 mg total) by mouth every 4 (four) hours as needed. 08/22/21  Yes [name], MD  pantoprazole (PROTONIX) 40 MG tablet Take 40 mg by mouth in the morning. 10/15/20  Yes [provider]  pravastatin (PRAVACHOL) 80 MG tablet Take 80 mg by mouth daily at 6 (six) AM.   Yes [provider]     Vital Signs: BP (!) 145/89 (BP Location: Right Arm)    Pulse (!) 105    Temp 98.2 F (36.8 C) (Oral)    Resp 20    Ht 5\' 5"  (1.651 m)    Wt 141 lb 15.6 oz (64.4 kg)    SpO2 100%    BMI 23.63 kg/m   Physical Exam Constitutional:      Appearance: She is not ill-appearing.  HENT:     Head: Normocephalic and atraumatic.     Mouth/Throat:     Pharynx: Oropharynx is clear.  Eyes:     Extraocular Movements: Extraocular movements intact.  Cardiovascular:     Rate and Rhythm: Tachycardia present.  Pulmonary:     Effort: Pulmonary effort is normal. No respiratory distress.  Skin:    General: Skin is warm and dry.  Neurological:     General: No focal deficit present.     Mental Status: She is alert and oriented to person, place, and time.  Psychiatric:        Mood and Affect: Mood normal.  Behavior: Behavior normal.  Left CVA: dressing for nephrostomy tube C/D/I, urine is clear/yellow and without sediment   Imaging: CT Head Wo Contrast  Result Date: 09/01/2021 CLINICAL DATA:  Mental status change, unknown cause, cervical cancer EXAM: CT HEAD WITHOUT CONTRAST TECHNIQUE: Contiguous axial images were obtained from the base of the skull through the vertex without intravenous contrast. RADIATION DOSE REDUCTION: This exam was performed according to the departmental dose-optimization program which includes automated exposure control, adjustment of the mA and/or kV according to patient size and/or use of iterative reconstruction technique. COMPARISON:  None. FINDINGS: Brain: There is no acute intracranial hemorrhage, mass effect, or edema. Gray-white  differentiation is preserved. There is no extra-axial fluid collection. Ventricles and sulci are within normal limits in size and configuration. Vascular: There is atherosclerotic calcification at the skull base. Skull: Calvarium is unremarkable. Sinuses/Orbits: No acute finding. Other: None. IMPRESSION: No acute intracranial abnormality. Electronically Signed   By: Macy Mis M.D.   On: 09/01/2021 12:46   DG Chest Port 1 View  Result Date: 09/01/2021 CLINICAL DATA:  Generalized weakness, lightheadedness EXAM: PORTABLE CHEST 1 VIEW COMPARISON:  08/19/2021 FINDINGS: Left chest wall port catheter is in similar position. No new consolidation or edema. Left suprahilar patchy density is unchanged. No pleural effusion. Normal heart size. IMPRESSION: No acute process in the chest.  Nodules on PET CT are not well seen. Electronically Signed   By: Macy Mis M.D.   On: 09/01/2021 12:35   DG C-Arm 1-60 Min-No Report  Result Date: 09/01/2021 Fluoroscopy was utilized by the requesting physician.  No radiographic interpretation.   CT Renal Stone Study  Result Date: 09/01/2021 CLINICAL DATA:  Kidney pain. Hematuria. History of cervical cancer. EXAM: CT ABDOMEN AND PELVIS WITHOUT CONTRAST TECHNIQUE: Multidetector CT imaging of the abdomen and pelvis was performed following the standard protocol without IV contrast. RADIATION DOSE REDUCTION: This exam was performed according to the departmental dose-optimization program which includes automated exposure control, adjustment of the mA and/or kV according to patient size and/or use of iterative reconstruction technique. COMPARISON:  PET-CT dated August 24, 2021. FINDINGS: Lower chest: No acute abnormality. Hepatobiliary: No focal liver abnormality. Unchanged gallstone. No gallbladder wall thickening or biliary dilatation. Pancreas: Unremarkable. No pancreatic ductal dilatation or surrounding inflammatory changes. Spleen: Normal in size without focal abnormality.  Adrenals/Urinary Tract: Adrenal glands are unremarkable. Unchanged moderate right and decreased now mild left hydroureteronephrosis. New foci of air within the left renal collecting system. Unchanged punctate calculus in the posterior right kidney upper pole. No ureteral calculi. Bilateral renovascular calcifications again noted. Significantly thick-walled, decompressed bladder. Stomach/Bowel: Stomach is within normal limits. Appendix appears normal. No evidence of bowel wall thickening, distention, or inflammatory changes. Vascular/Lymphatic: Aortic atherosclerosis. Infrarenal IVC filter again noted. No enlarged abdominal or pelvic lymph nodes. Reproductive: Status post hysterectomy. No adnexal masses. Other: No free fluid or pneumoperitoneum. Prior ventral hernia repair with unchanged mild anterior abdominal diastasis. Musculoskeletal: No acute or significant osseous findings. IMPRESSION: 1. New left emphysematous pyelitis. Unchanged moderate right and decreased now mild left hydroureteronephrosis. 2. Unchanged punctate right nephrolithiasis. 3. Significantly thick-walled, decompressed bladder, consistent with cystitis. 4. Unchanged cholelithiasis. 5. Aortic Atherosclerosis (ICD10-I70.0). Electronically Signed   By: Titus Dubin M.D.   On: 09/01/2021 14:13   IR NEPHROSTOMY PLACEMENT LEFT  Result Date: 09/03/2021 INDICATION: 56 year old woman with history of cervical malignancy and hematuria was found to have bilateral hydronephrosis. She underwent successful retrograde ureteral stent placement are on the right. IR consulted for left nephrostomy  drain placement. EXAM: Ultrasound and fluoroscopy guided left percutaneous nephrostomy drain placement COMPARISON:  None. MEDICATIONS: Rocephin 1 g IV; The antibiotic was administered in an appropriate time frame prior to skin puncture. ANESTHESIA/SEDATION: Fentanyl 1 mcg IV; Versed 50 mg IV Moderate Sedation Time:  10 minutes The patient was continuously monitored  during the procedure by the interventional radiology nurse under my direct supervision. CONTRAST:  5 mL of Omnipaque 350-administered into the collecting system(s) FLUOROSCOPY TIME:  Fluoroscopy Time: 1 minutes 12 seconds (4 mGy). COMPLICATIONS: None immediate. PROCEDURE: Informed written consent was obtained from the patient after a thorough discussion of the procedural risks, benefits and alternatives. All questions were addressed. Maximal Sterile Barrier Technique was utilized including caps, mask, sterile gowns, sterile gloves, sterile drape, hand hygiene and skin antiseptic. A timeout was performed prior to the initiation of the procedure. Patient position prone on the procedure table. The left flank skin prepped and draped usual fashion. Sterile ultrasound probe cover and gel utilized throughout the procedure. Following local lidocaine administration, 21 gauge needle was inserted into a midpole calyx utilizing continuous ultrasound guidance. Urine return was noted from the needle hub. The needle was exchanged for a transitional dilator set over 0.018 inch guidewire. Transitional dilator set exchanged for 10.2 [place] multipurpose pigtail drain over 0.035 inch guidewire. Contrast administered through the drain confirmed appropriate positioning of the drain within the renal collecting system. Drain was secured to skin with suture and connected to bag. IMPRESSION: 10.2 French left nephrostomy drain placed utilizing ultrasound and fluoroscopic guidance. PLAN: Patient can be considered for left ureteral stent placement next week. Electronically Signed   By: Miachel Roux M.D.   On: 09/03/2021 12:29   VAS [place] LOWER EXTREMITY VENOUS (DVT)  Result Date: 09/04/2021  Lower Venous DVT Study Patient Name:  Jaime Gomez  Date of Exam:   09/03/2021 Medical Rec #: [id number]     Accession #:    [id number] Date of Birth: [birth year]     Patient Gender: F Patient Age:   56 years Exam Location:  [facility] Procedure:       VAS [place] LOWER EXTREMITY VENOUS (DVT) Referring Phys: Burney Gauze --------------------------------------------------------------------------------  Indications: Follow up DVT of right lower extremity.  Risk Factors: Cancer ovarian/uterine. Anticoagulation: Eliquis. Comparison Study: Prior studies on 01/18/21 and 02/04/21 performed at outside                   facility in [place] were positive for extensive DVT of the                   right lower extremity. Performing Technologist: Darlin Coco RDMS, RVT  Examination Guidelines: A complete evaluation includes B-mode imaging, spectral Doppler, color Doppler, and power Doppler as needed of all accessible portions of each vessel. Bilateral testing is considered an integral part of a complete examination. Limited examinations for reoccurring indications may be performed as noted. The reflux portion of the exam is performed with the patient in reverse Trendelenburg.  +---------+---------------+---------+-----------+----------+-----------------+  RIGHT     Compressibility Phasicity Spontaneity Properties Thrombus Aging     +---------+---------------+---------+-----------+----------+-----------------+  CFV       Partial         Yes       Yes                    Age Indeterminate  +---------+---------------+---------+-----------+----------+-----------------+  SFJ       Partial         Yes  Yes                    Age Indeterminate  +---------+---------------+---------+-----------+----------+-----------------+  FV Prox   Full            Yes       Yes                    Chronic            +---------+---------------+---------+-----------+----------+-----------------+  FV Mid    None            No        No                     Chronic            +---------+---------------+---------+-----------+----------+-----------------+  FV Distal None            No        No                     Chronic             +---------+---------------+---------+-----------+----------+-----------------+  PFV       Partial         Yes       Yes                    Age Indeterminate  +---------+---------------+---------+-----------+----------+-----------------+  POP       Partial         Yes       Yes                    Chronic            +---------+---------------+---------+-----------+----------+-----------------+  PTV       Full                                                                +---------+---------------+---------+-----------+----------+-----------------+  PERO      Partial         No        No                     Age Indeterminate  +---------+---------------+---------+-----------+----------+-----------------+  Gastroc   Partial                                                             +---------+---------------+---------+-----------+----------+-----------------+  EIV       Partial         Yes       Yes                    Age Indeterminate  +---------+---------------+---------+-----------+----------+-----------------+   +----+---------------+---------+-----------+----------+--------------+  LEFT Compressibility Phasicity Spontaneity Properties Thrombus Aging  +----+---------------+---------+-----------+----------+--------------+  CFV  Full            Yes       Yes                                    +----+---------------+---------+-----------+----------+--------------+  Summary: RIGHT: - Findings consistent with partial, age indeterminate deep vein thrombosis involving the right common femoral vein, SF junction, right proximal profunda vein, and right peroneal veins. - Findings consistent with chronic deep vein thrombosis involving the right femoral vein, and right popliteal vein.  - A cystic structure is found in the popliteal fossa.  - Partial, age indeterminate deep vein thrombosis extends proximal to the inguinal ligament.  LEFT: - No evidence of common femoral vein obstruction.  *See table(s) above for measurements  and observations. Electronically signed by Jamelle Haring on 09/04/2021 at 10:40:06 AM.    Final     Labs:  CBC: Recent Labs    09/01/21 1044 09/02/21 0443 09/03/21 0727 09/04/21 0719  WBC 6.1 1.8* 12.4* 9.3  HGB 9.9* 8.5* 9.8* 8.5*  HCT 30.3* 26.5* 30.1* 25.5*  PLT 179 111* 147* 114*    COAGS: Recent Labs    09/03/21 0727  INR 1.3*    BMP: Recent Labs    09/01/21 1044 09/02/21 0443 09/03/21 0727 09/04/21 0719  NA 133* 139 140 140  K 5.1 5.2* 4.4 3.8  CL 104 113* 113* 116*  CO2 8* 11* 11* 10*  GLUCOSE 217* 169* 242* 80  BUN 122* 118* 123* 103*  CALCIUM 8.9 8.0* 7.9* 7.2*  CREATININE 8.45* 7.61* 7.17* 6.43*  GFRNONAA 5* 6* 6* 7*    LIVER FUNCTION TESTS: Recent Labs    08/18/21 1046 09/01/21 1044 09/02/21 0443  BILITOT 0.3 0.9  --   AST 7* 144*  --   ALT <5 27  --   ALKPHOS 61 84  --   PROT 7.8 7.9  --   ALBUMIN 4.0 3.0* 2.4*    Assessment and Plan:  Hydronephrosis 1 day s/p left PCN, WBC normalized, clear urine draining well Will defer further management to Urology  IR remains available to assist or consult as needed.    Electronically Signed: Pasty Spillers, PA 09/04/2021, 2:22 PM   I spent a total of 25 Minutes at the the patient's bedside AND on the patient's hospital floor or unit, greater than 50% of which was counseling/coordinating care for hydronephrosis

## 2021-09-04 NOTE — Progress Notes (Signed)
PROGRESS NOTE    Jaime Gomez  KDX:[id number] DOB: 1965-09-01 DOA: 09/01/2021 PCP: Darvin Neighbours, FNP    Brief Narrative:  Jaime Gomez is a 56 y.o. female with past medical history significant of metastatic endometrial cancer, DVT, PE, anemia, neutropenia, CKD 3-4, hypertension, gastroparesis, diabetes presented to hospital with complaints of dizziness lightheadedness.  Patient with known metastatic endometrial cancer and is established with oncology. In the ED, vitals were stable.  Sodium was 133, potassium of 5.1, bicarb of 8 with anion gap of 21 and BUN of 123.  Creatinine was elevated at  Creatinine elevated to 8.45 up from 5.75, almost 2 weeks ago and around 3 in April.  Respiratory panel for flu and COVID-negative.  Urinalysis with glucose, hemoglobin, ketones, protein, leukocytes, few bacteria. Chest x-ray showed no acute normality.  CT head showed no acute abnormality.  CT renal stone study showed new left emphysematous pyelitis, evidence of cystitis, stable moderate right hydronephrosis and improved to mild left hydronephrosis with small stones present in the kidneys.  Gallstones also noted.  Urology was consulted in ED and recommended left stent placement as source of AKI thought to be obstructive and also recommend urinalysis and ceftriaxone with transfer to [facility] for cystoscopy and intervention.  Urology was unable to left-sided ureteric intervention so IR has been consulted for percutaneous nephrostomy.  Patient underwent  percutaneous nephrostomy on the left side on 09/03/2021 and creatinine has been slightly trending down.     Assessment and Plan: * AKI (acute kidney injury) ([place])- (present on admission) On CKD stage IIIa.  With acute component at this time due to obstructive uropathy.  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  Patient underwent percutaneous nephrostomy placement by IR on 09/03/2021.  Eliquis has been resumed.  Patient does not wish to go for dialysis and not uremicat this time.   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  Seen on CT scan abdomen pelvis on 09/01/2021.  Urine culture with multiple species.  On broad-spectrum antibiotic at this time.    Endometrial cancer, FIGO stage IVB ([place])- (present on admission) Being followed by oncology as outpatient.  PET scan done which was positive in the lungs as well.  Continue letrozole and everolimus.  Hyperkalemia Borderline high.  Improved at this time.  Type 2 diabetes mellitus without complications (HCC) Controlled.  On long-acting and sliding scale insulin.  Continue to monitor closely.  Personal history of other venous thrombosis and embolism Eliquis has been resumed since 09/03/2021 after percutaneous nephrostomy.  Has a history of IVC filter placement.  Essential (primary) hypertension- (present on admission) Coreg on hold  Chronic kidney disease, stage 3a ([place])- (present on admission) Currently acute kidney injury from obstructive uropathy.  Creatinine has slightly improved today.  We will continue to monitor. Check BMP in AM.  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 failure- (present on admission) Latest hemoglobin of 9.8 we will continue to monitor.  No evidence of bleeding.    DVT prophylaxis: apixaban (ELIQUIS) tablet 2.5 mg Start: 09/03/21 2200  Eliquis on hold anticipating procedure. apixaban (ELIQUIS) tablet 2.5 mg   Code Status:     Code Status: Full Code  Disposition: Home likely by tomorrow if creatinine continues to improve and clinically stable.  Status is: Inpatient  Remains inpatient appropriate because: Obstructive uropathy, status post percutaneous nephrostomy, acute  kidney injury   Family Communication:  I again spoke with the patient's sister at bedside.   Consultants:  Urology Interventional  radiology Oncology  Procedures:  Cystoscopy with right retrograde pyelogram and ureteral stent placement by Dr. Rexene Alberts urology on 09/01/2021.  Antimicrobials:  Rocephin IV 09/01/21>  Anti-infectives (From admission, onward)    Start     Dose/Rate Route Frequency Ordered Stop   09/02/21 1400  cefTRIAXone (ROCEPHIN) 1 g in sodium chloride 0.9 % 100 mL IVPB        1 g 200 mL/hr over 30 Minutes Intravenous Every 24 hours 09/01/21 2140     09/01/21 1445  cefTRIAXone (ROCEPHIN) 1 g in sodium chloride 0.9 % 100 mL IVPB        1 g 200 mL/hr over 30 Minutes Intravenous  Once 09/01/21 1442 09/01/21 1517      Subjective:  Today, patient was seen and examined at bedside.  Patient denies any nausea vomiting, fever chills or rigor.  Has had a bowel movement.  Feels overall improved.  Eating better.    Objective: Vitals:   09/03/21 1150 09/03/21 1600 09/03/21 2000 09/04/21 0506  BP: 115/84 116/73 135/82 (!) 145/89  Pulse: 92 81 92 (!) 105  Resp: 18 16 18 20   Temp: 97.6 F (36.4 C) 97.6 F (36.4 C) (!) 97.3 F (36.3 C) 98.2 F (36.8 C)  TempSrc: Oral Oral Oral Oral  SpO2: 100% 100% 100% 100%  Weight:      Height:        Intake/Output Summary (Last 24 hours) at 09/04/2021 1013 Last data filed at 09/04/2021 0900 Gross per 24 hour  Intake 2210.1 ml  Output 1575 ml  Net 635.1 ml   Filed Weights   09/01/21 1027 09/01/21 2200  Weight: 66.7 kg 64.4 kg   Body mass index is 23.63 kg/m.   Physical Examination:  General:  Average built, not in obvious distress HENT:   Pallor noted.  Oral mucosa is moist.  Chest:  Clear breath sounds.  Diminished breath sounds bilaterally. No crackles or wheezes.  CVS: S1 &S2 heard. No murmur.  Regular rate and rhythm. Abdomen: Soft, nontender, nondistended.  Bowel sounds are heard.   Extremities: No cyanosis, clubbing or edema.  Peripheral pulses are palpable. Psych: Alert, awake and oriented, normal mood CNS:  No cranial nerve deficits.  Power  equal in all extremities.   Skin: Warm and dry.  No rashes noted.  Data Reviewed:   CBC: Recent Labs  Lab 09/01/21 1044 09/02/21 0443 09/03/21 0727 09/04/21 0719  WBC 6.1 1.8* 12.4* 9.3  NEUTROABS 5.7  --  11.3*  --   HGB 9.9* 8.5* 9.8* 8.5*  HCT 30.3* 26.5* 30.1* 25.5*  MCV 88.3 90.1 89.1 88.9  PLT 179 111* 147* 114*    Basic Metabolic Panel: Recent Labs  Lab 09/01/21 1044 09/02/21 0443 09/03/21 0727 09/04/21 0719  NA 133* 139 140 140  K 5.1 5.2* 4.4 3.8  CL 104 113* 113* 116*  CO2 8* 11* 11* 10*  GLUCOSE 217* 169* 242* 80  BUN 122* 118* 123* 103*  CREATININE 8.45* 7.61* 7.17* 6.43*  CALCIUM 8.9 8.0* 7.9* 7.2*  MG  --   --  2.0 1.7  PHOS  --  11.3* 11.4*  --     Liver Function Tests: Recent Labs  Lab 09/01/21 1044 09/02/21 0443  AST 144*  --   ALT 27  --   ALKPHOS 84  --   BILITOT 0.9  --  PROT 7.9  --   ALBUMIN 3.0* 2.4*     Radiology Studies: IR NEPHROSTOMY PLACEMENT LEFT  Result Date: 09/03/2021 INDICATION: 56 year old woman with history of cervical malignancy and hematuria was found to have bilateral hydronephrosis. She underwent successful retrograde ureteral stent placement are on the right. IR consulted for left nephrostomy drain placement. EXAM: Ultrasound and fluoroscopy guided left percutaneous nephrostomy drain placement COMPARISON:  None. MEDICATIONS: Rocephin 1 g IV; The antibiotic was administered in an appropriate time frame prior to skin puncture. ANESTHESIA/SEDATION: Fentanyl 1 mcg IV; Versed 50 mg IV Moderate Sedation Time:  10 minutes The patient was continuously monitored during the procedure by the interventional radiology nurse under my direct supervision. CONTRAST:  5 mL of Omnipaque 350-administered into the collecting system(s) FLUOROSCOPY TIME:  Fluoroscopy Time: 1 minutes 12 seconds (4 mGy). COMPLICATIONS: None immediate. PROCEDURE: Informed written consent was obtained from the patient after a thorough discussion of the procedural  risks, benefits and alternatives. All questions were addressed. Maximal Sterile Barrier Technique was utilized including caps, mask, sterile gowns, sterile gloves, sterile drape, hand hygiene and skin antiseptic. A timeout was performed prior to the initiation of the procedure. Patient position prone on the procedure table. The left flank skin prepped and draped usual fashion. Sterile ultrasound probe cover and gel utilized throughout the procedure. Following local lidocaine administration, 21 gauge needle was inserted into a midpole calyx utilizing continuous ultrasound guidance. Urine return was noted from the needle hub. The needle was exchanged for a transitional dilator set over 0.018 inch guidewire. Transitional dilator set exchanged for 10.2 [place] multipurpose pigtail drain over 0.035 inch guidewire. Contrast administered through the drain confirmed appropriate positioning of the drain within the renal collecting system. Drain was secured to skin with suture and connected to bag. IMPRESSION: 10.2 French left nephrostomy drain placed utilizing ultrasound and fluoroscopic guidance. PLAN: Patient can be considered for left ureteral stent placement next week. Electronically Signed   By: Miachel Roux M.D.   On: 09/03/2021 12:29   VAS [place] LOWER EXTREMITY VENOUS (DVT)  Result Date: 09/03/2021  Lower Venous DVT Study Patient Name:  KENASIA SCHELLER  Date of Exam:   09/03/2021 Medical Rec #: [id number]     Accession #:    [id number] Date of Birth: 07-09-1966     Patient Gender: F Patient Age:   43 years Exam Location:  [facility] Procedure:      VAS [place] LOWER EXTREMITY VENOUS (DVT) Referring Phys: Burney Gauze --------------------------------------------------------------------------------  Indications: Follow up DVT of right lower extremity.  Risk Factors: Cancer ovarian/uterine. Anticoagulation: Eliquis. Comparison Study: Prior studies on 01/18/21 and 02/04/21 performed at outside                   facility in  [place] were positive for extensive DVT of the                   right lower extremity. Performing Technologist: Darlin Coco RDMS, RVT  Examination Guidelines: A complete evaluation includes B-mode imaging, spectral Doppler, color Doppler, and power Doppler as needed of all accessible portions of each vessel. Bilateral testing is considered an integral part of a complete examination. Limited examinations for reoccurring indications may be performed as noted. The reflux portion of the exam is performed with the patient in reverse Trendelenburg.  +---------+---------------+---------+-----------+----------+-----------------+  RIGHT     Compressibility Phasicity Spontaneity Properties Thrombus Aging     +---------+---------------+---------+-----------+----------+-----------------+  CFV       Partial  Yes       Yes                    Age Indeterminate  +---------+---------------+---------+-----------+----------+-----------------+  SFJ       Partial         Yes       Yes                    Age Indeterminate  +---------+---------------+---------+-----------+----------+-----------------+  FV Prox   Full            Yes       Yes                    Chronic            +---------+---------------+---------+-----------+----------+-----------------+  FV Mid    None            No        No                     Chronic            +---------+---------------+---------+-----------+----------+-----------------+  FV Distal None            No        No                     Chronic            +---------+---------------+---------+-----------+----------+-----------------+  PFV       Partial         Yes       Yes                    Age Indeterminate  +---------+---------------+---------+-----------+----------+-----------------+  POP       Partial         Yes       Yes                    Chronic            +---------+---------------+---------+-----------+----------+-----------------+  PTV       Full                                                                 +---------+---------------+---------+-----------+----------+-----------------+  PERO      Partial         No        No                     Age Indeterminate  +---------+---------------+---------+-----------+----------+-----------------+  Gastroc   Partial                                                             +---------+---------------+---------+-----------+----------+-----------------+  EIV       Partial         Yes       Yes                    Age Indeterminate  +---------+---------------+---------+-----------+----------+-----------------+   +----+---------------+---------+-----------+----------+--------------+  LEFT  Compressibility Phasicity Spontaneity Properties Thrombus Aging  +----+---------------+---------+-----------+----------+--------------+  CFV  Full            Yes       Yes                                    +----+---------------+---------+-----------+----------+--------------+    Summary: RIGHT: - Findings consistent with partial, age indeterminate deep vein thrombosis involving the right common femoral vein, SF junction, right proximal profunda vein, and right peroneal veins. - Findings consistent with chronic deep vein thrombosis involving the right femoral vein, and right popliteal vein.  - A cystic structure is found in the popliteal fossa.  - Partial, age indeterminate deep vein thrombosis extends proximal to the inguinal ligament.  LEFT: - No evidence of common femoral vein obstruction.  *See table(s) above for measurements and observations.    Preliminary       LOS: 3 days    Flora Lipps, MD [facility] 09/04/2021, 10:13 AM

## 2021-09-05 ENCOUNTER — Encounter (HOSPITAL_COMMUNITY): Payer: Self-pay | Admitting: Family Medicine

## 2021-09-05 DIAGNOSIS — N12 Tubulo-interstitial nephritis, not specified as acute or chronic: Secondary | ICD-10-CM | POA: Diagnosis not present

## 2021-09-05 DIAGNOSIS — N1831 Chronic kidney disease, stage 3a: Secondary | ICD-10-CM | POA: Diagnosis not present

## 2021-09-05 DIAGNOSIS — N179 Acute kidney failure, unspecified: Secondary | ICD-10-CM | POA: Diagnosis not present

## 2021-09-05 DIAGNOSIS — N189 Chronic kidney disease, unspecified: Secondary | ICD-10-CM | POA: Diagnosis not present

## 2021-09-05 LAB — CBC
HCT: 23.8 % — ABNORMAL LOW (ref 36.0–46.0)
Hemoglobin: 7.7 g/dL — ABNORMAL LOW (ref 12.0–15.0)
MCH: 29.2 pg (ref 26.0–34.0)
MCHC: 32.4 g/dL (ref 30.0–36.0)
MCV: 90.2 fL (ref 80.0–100.0)
Platelets: 120 10*3/uL — ABNORMAL LOW (ref 150–400)
RBC: 2.64 MIL/uL — ABNORMAL LOW (ref 3.87–5.11)
RDW: 16.5 % — ABNORMAL HIGH (ref 11.5–15.5)
WBC: 9.8 10*3/uL (ref 4.0–10.5)
nRBC: 0 % (ref 0.0–0.2)

## 2021-09-05 LAB — COMPREHENSIVE METABOLIC PANEL
ALT: 29 U/L (ref 0–44)
AST: 76 U/L — ABNORMAL HIGH (ref 15–41)
Albumin: 2.5 g/dL — ABNORMAL LOW (ref 3.5–5.0)
Alkaline Phosphatase: 95 U/L (ref 38–126)
Anion gap: 13 (ref 5–15)
BUN: 95 mg/dL — ABNORMAL HIGH (ref 6–20)
CO2: 10 mmol/L — ABNORMAL LOW (ref 22–32)
Calcium: 7.1 mg/dL — ABNORMAL LOW (ref 8.9–10.3)
Chloride: 118 mmol/L — ABNORMAL HIGH (ref 98–111)
Creatinine, Ser: 5.69 mg/dL — ABNORMAL HIGH (ref 0.44–1.00)
GFR, Estimated: 8 mL/min — ABNORMAL LOW (ref >60)
Glucose, Bld: 66 mg/dL — ABNORMAL LOW (ref 70–99)
Potassium: 3.7 mmol/L (ref 3.5–5.1)
Sodium: 141 mmol/L (ref 135–145)
Total Bilirubin: 0.1 mg/dL — ABNORMAL LOW (ref 0.3–1.2)
Total Protein: 5.9 g/dL — ABNORMAL LOW (ref 6.5–8.1)

## 2021-09-05 LAB — MAGNESIUM: Magnesium: 1.5 mg/dL — ABNORMAL LOW (ref 1.7–2.4)

## 2021-09-05 LAB — GLUCOSE, CAPILLARY
Glucose-Capillary: 58 mg/dL — ABNORMAL LOW (ref 70–99)
Glucose-Capillary: 74 mg/dL (ref 70–99)
Glucose-Capillary: 74 mg/dL (ref 70–99)

## 2021-09-05 LAB — PHOSPHORUS: Phosphorus: 6.6 mg/dL — ABNORMAL HIGH (ref 2.5–4.6)

## 2021-09-05 MED ORDER — POLYETHYLENE GLYCOL 3350 17 G PO PACK: 17.0000 g | PACK | Freq: Every day | ORAL | 0 refills | Status: DC | PRN

## 2021-09-05 MED ORDER — MAGNESIUM OXIDE -MG SUPPLEMENT 400 (240 MG) MG PO TABS: 400.0000 mg | ORAL_TABLET | Freq: Two times a day (BID) | ORAL | Status: DC

## 2021-09-05 MED ORDER — CEFDINIR 300 MG PO CAPS: 300.0000 mg | ORAL_CAPSULE | Freq: Every day | ORAL | 0 refills | Status: AC

## 2021-09-05 MED ORDER — DEXTROSE 50 % IV SOLN
12.5000 g | INTRAVENOUS | Status: DC
  Filled 2021-09-05: qty 50

## 2021-09-05 MED ORDER — MAGNESIUM OXIDE -MG SUPPLEMENT 400 (240 MG) MG PO TABS: 400.0000 mg | ORAL_TABLET | Freq: Every day | ORAL | Status: DC

## 2021-09-05 NOTE — Discharge Summary (Signed)
Physician Discharge Summary   Patient: Jaime Gomez MRN: [id number] DOB: 20-May-1966  Admit date:     09/01/2021  Discharge date: 09/05/21  Discharge Physician: Corrie Mckusick Tameem Pullara   PCP: Darvin Neighbours, FNP   Recommendations at discharge:   Follow-up with urology as outpatient in 2 weeks. Follow-up with primary care physician in 1 week to check blood work Follow-up with oncology as scheduled by the clinic.  Discharge Diagnoses: Principal Problem:   AKI (acute kidney injury) ([place]) Active Problems:   Emphysematous pyelitis   Endometrial cancer, FIGO stage IVB ([place])   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 failure   Chronic kidney disease, stage 3a (HCC)   Essential (primary) hypertension   Personal history of other venous thrombosis and embolism   Personal history of pulmonary embolism   Type 2 diabetes mellitus without complications (HCC)   Hyperkalemia  Resolved Problems:   * No resolved hospital problems. *   Hospital Course: Jaime Gomez is a 55 y.o. female with past medical history significant of metastatic endometrial cancer, DVT, PE, anemia, neutropenia, CKD 3-4, hypertension, gastroparesis, diabetes presented to hospital with complaints of dizziness, lightheadedness.  Patient with known metastatic endometrial cancer and is established with oncology. In the ED, vitals were stable.  Sodium was 133, potassium of 5.1, bicarb of 8 with anion gap of 21 and BUN of 123.  Creatinine was elevated at  Creatinine elevated to 8.45 up from 5.75, almost 2 weeks ago and around 3.0 in April.  Respiratory panel for flu and COVID-negative.  Urinalysis with glucose, hemoglobin, ketones, protein, leukocytes, few bacteria. Chest x-ray showed no acute normality.  CT head showed no acute abnormality.  CT renal stone study showed new left emphysematous pyelitis, evidence of cystitis, stable moderate right hydronephrosis and improved to mild left hydronephrosis with small stones present in the kidneys.  Gallstones also  noted.  Urology was consulted in ED and recommended left stent placement as source of AKI thought to be obstructive and also recommend urinalysis and ceftriaxone with transfer to [facility] for cystoscopy and intervention.  Urology was unable to left-sided ureteric intervention so IR was consulted for percutaneous nephrostomy.  Patient underwent  percutaneous nephrostomy on the left side on 09/03/2021.    Following conditions were addressed during hospitalization as listed in the assessment and plan,.  Assessment and Plan: * AKI (acute kidney injury) ([place])- (present on admission) On CKD stage IIIa due to obstructive uropathy.  Has significantly improved at this time.  Creatinine at 5.6 today almost at baseline.  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  Patient underwent percutaneous nephrostomy placement by IR on 09/03/2021.   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  Seen on CT scan abdomen pelvis on 09/01/2021.  Urine culture with multiple species.  Received IV Rocephin during hospitalization.  No positive cultures so far but will complete 4 days more of antibiotic to complete 7-day course.  Endometrial cancer, FIGO stage IVB ([place])- (present on admission) Being followed by oncology as outpatient.  PET scan done which was positive in the lungs as well.  Continue letrozole and everolimus.  Seen by oncology prior to discharge.  Hyperkalemia  Improved at this time.  Type 2 diabetes mellitus without complications (HCC) Controlled.  Resume home insulin regimen on discharge.  Personal history of other venous thrombosis and embolism Eliquis has been  resumed since 09/03/2021 after percutaneous nephrostomy.  Has a history of IVC filter placement.  Essential (primary) hypertension- (present on admission) Resume coreg from home on  discharge.  Chronic kidney disease, stage 3a ([place])- (present on admission) Currently acute kidney injury from obstructive uropathy.  Creatinine has slightly improved today.  Will need outpatient follow-up.  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 failure- (present on admission) Latest hemoglobin of 7.7 monitor as outpatient.   Consultants:  Urology Medical oncology Interventional radiology  Procedures performed:  Percutaneous nephrostomy on 09/03/2021 by interventional radiology Cystoscopy with right retrograde pyelogram and ureteral stent placement by Dr. Rexene Alberts urology on 09/01/2021.  Disposition: Home Diet recommendation:  Discharge Diet Orders (From admission, onward)     Start     Ordered   09/05/21 0000  Diet - low sodium heart healthy        09/05/21 0840   09/05/21 0000  Diet Carb Modified        09/05/21 0840           Carb modified diet  DISCHARGE MEDICATION: Allergies as of 09/05/2021       Reactions   Taxotere [docetaxel]    Throat swoll        Medication List     TAKE these medications    apixaban 2.5 MG Tabs tablet Commonly known as: ELIQUIS Take 2.5 mg by mouth 2 (two) times daily.   atorvastatin 10 MG tablet Commonly known as: LIPITOR Take 10 mg by mouth daily at 6 (six) AM.   carvedilol 25 MG tablet Commonly known as: COREG Take 25 mg by mouth daily at 6 (six) AM.   cefdinir 300 MG capsule Commonly known as: OMNICEF Take 1 capsule (300 mg total) by mouth daily for 4 days.   cefdinir 300 MG capsule Commonly known as: OMNICEF Take 1 capsule (300 mg total) by mouth daily for 4 days.   everolimus 10 MG tablet Commonly known as: AFINITOR Take 10 mg by mouth daily at 6 (six) AM.   folic acid 1 MG tablet Commonly known as: FOLVITE Take 1 mg by mouth daily at 6 (six) AM.   gabapentin 100 MG capsule Commonly known as: NEURONTIN Take 100 mg by mouth 2 (two) times daily.   insulin detemir 100 UNIT/ML injection Commonly known as:  LEVEMIR Inject 20 Units into the skin 2 (two) times daily.   letrozole 2.5 MG tablet Commonly known as: FEMARA Take 2.5 mg by mouth daily at 6 (six) AM.   magnesium oxide 400 (240 Mg) MG tablet Commonly known as: MAG-OX Take 1 tablet (400 mg total) by mouth daily.   mirabegron ER 25 MG Tb24 tablet Commonly known as: Myrbetriq Take 1 tablet (25 mg total) by mouth daily for 360 doses.   nitrofurantoin (macrocrystal-monohydrate) 100 MG capsule Commonly known as: MACROBID Take 100 mg by mouth 2 (two) times daily.   NovoLOG Mix 70/30 FlexPen (70-30) 100 UNIT/ML FlexPen Generic drug: insulin aspart protamine - aspart Inject 20 Units into the skin as directed. Per meal   ondansetron 8 MG tablet Commonly known as: ZOFRAN Take 8 mg by mouth every 8 (eight) hours as needed.   oxycodone 5 MG capsule Commonly known as: OXY-IR Take 1 capsule (5 mg total) by mouth every 4 (four) hours as needed.   pantoprazole 40 MG tablet Commonly known as: PROTONIX Take 40 mg by mouth in the morning.   polyethylene glycol 17 g packet Commonly known as: MIRALAX / GLYCOLAX Take 17 g by mouth  daily as needed for mild constipation.   pravastatin 80 MG tablet Commonly known as: PRAVACHOL Take 80 mg by mouth daily at 6 (six) AM.        Follow-up Information     Wall, [name], FNP Follow up in 1 week(s).   Specialty: Family Medicine Why: check blood work and regular follow-up. Contact information: [place] [address] [phone]         Janith Lima, MD. Schedule an appointment as soon as possible for a visit in 2 week(s).   Specialty: Urology Contact information: [place] [address] [phone]         [name], MD Follow up.   Specialty: Oncology Why: as per office Contact information: [address] [place] [address] [phone]                Subjective Today, patient was seen and  examined at bedside.  Wishes to go home.  Denies any nausea vomiting fever pain chills or rigor.  Discharge Exam: Filed Weights   09/01/21 1027 09/01/21 2200  Weight: 66.7 kg 64.4 kg   General:  Average built, not in obvious distress HENT:   Mild pallor noted.  Oral mucosa is moist.  Chest:  Clear breath sounds.  Diminished breath sounds bilaterally. No crackles or wheezes.  CVS: S1 &S2 heard. No murmur.  Regular rate and rhythm. Abdomen: Soft, nontender, nondistended.  Bowel sounds are heard.   Extremities: No cyanosis, clubbing or edema.  Peripheral pulses are palpable. Psych: Alert, awake and oriented, normal mood CNS:  No cranial nerve deficits.  Power equal in all extremities.   Skin: Warm and dry.  No rashes noted.   Condition at discharge: good  The results of significant diagnostics from this hospitalization (including imaging, microbiology, ancillary and laboratory) are listed below for reference.   Imaging Studies: DG Chest 2 View  Result Date: 08/19/2021 CLINICAL DATA:  Chest port placement EXAM: CHEST - 2 VIEW COMPARISON:  None. FINDINGS: Left chest wall port with tip in the left brachycephalic vein near the junction with the SVC. Normal heart size. Linear opacities of the left upper lobe with associated volume loss, potentially post treatment changes. No pleural effusion or pneumothorax. IMPRESSION: 1. Left chest wall port with tip in the left brachycephalic vein near the junction with the SVC. 2. Linear opacities of the left upper lobe with associated volume loss, potentially post treatment changes. Correlate with outside imaging. Electronically Signed   By: Yetta Glassman M.D.   On: 08/19/2021 09:03   CT Head Wo Contrast  Result Date: 09/01/2021 CLINICAL DATA:  Mental status change, unknown cause, cervical cancer EXAM: CT HEAD WITHOUT CONTRAST TECHNIQUE: Contiguous axial images were obtained from the base of the skull through the vertex without intravenous contrast.  RADIATION DOSE REDUCTION: This exam was performed according to the departmental dose-optimization program which includes automated exposure control, adjustment of the mA and/or kV according to patient size and/or use of iterative reconstruction technique. COMPARISON:  None. FINDINGS: Brain: There is no acute intracranial hemorrhage, mass effect, or edema. Gray-white differentiation is preserved. There is no extra-axial fluid collection. Ventricles and sulci are within normal limits in size and configuration. Vascular: There is atherosclerotic calcification at the skull base. Skull: Calvarium is unremarkable. Sinuses/Orbits: No acute finding. Other: None. IMPRESSION: No acute intracranial abnormality. Electronically Signed   By: Macy Mis M.D.   On: 09/01/2021 12:46  NM PET Image Initial (PI) Skull Base To Thigh  Result Date: 08/25/2021 CLINICAL DATA:  SUBSEQUENT treatment strategy for UTERINE/CERVICAL CARCINOMA. Prior radiation therapy. Current oral chemotherapy. EXAM: NUCLEAR MEDICINE PET SKULL BASE TO THIGH TECHNIQUE: 6.9 mCi F-18 FDG was injected intravenously. Full-ring PET imaging was performed from the skull base to thigh after the radiotracer. CT data was obtained and used for attenuation correction and anatomic localization. Fasting blood glucose: 2.8 mg/dl COMPARISON:  None available. FINDINGS: Mediastinal blood pool activity: SUV max 2.8 Liver activity: SUV max NA NECK: No hypermetabolic lymph nodes in the neck. Incidental CT findings: none CHEST: Within the LEFT lower lobe, there is a rounded infrahilar nodule measuring 1.6 by 1.6 cm (image 75/4). This nodule has associated metabolic activity with SUV max equal 6.2. There is a parenchymal thickening superior to the LEFT hilum in LEFT upper lobe. Additional LEFT upper lobe nodule measures 11 mm on image 58/4. This suprahilar parenchymal thickening and LEFT upper lobe nodule do not have significant metabolic activity. Within the RIGHT lower lobe 13  mm nodule (image 71/4) has moderate metabolic activity SUV max equal 3.3. RIGHT upper lobe nodule measuring 10 mm (68/4) has mild metabolic activity SUV max equal 2.0. No hypermetabolic mediastinal lymph nodes. Incidental CT findings: none ABDOMEN/PELVIS: No abnormal metabolic activity liver. No hypermetabolic periportal retroperitoneal nodes. Within the pelvis, no hypermetabolic sidewall lesions or evidence local recurrence. Physiologic activity noted in the bladder. No hypermetabolic pelvic adenopathy. Incidental CT findings: Postsurgical change consistent hysterectomy. Ventral abdominal mesh. Infra renal IVC filter noted. Mild bilateral hydronephrosis. SKELETON: No focal hypermetabolic activity to suggest skeletal metastasis. Incidental CT findings: none IMPRESSION: 1. Single hypermetabolic nodules in the LEFT lower lobe and RIGHT lower lobe consistent with metastatic pulmonary nodules. 2. Smaller nodules within the upper lobes have low metabolic activity but are also suspicious for metastatic disease. 3. No evidence local uterine carcinoma recurrence in the pelvis. 4. No metastatic adenopathy in the abdomen pelvis. 5. Mild bilateral hydronephrosis. Electronically Signed   By: Suzy Bouchard M.D.   On: 08/25/2021 09:10   IR CV Line Injection  Result Date: 08/23/2021 INDICATION: 56 year old female with history of gynecologic malignancy with indwelling left chest wall port placed in [facility] years ago. She presents with inability to aspirate from the port. EXAM: FLUOROSCOPIC GUIDED PORT A CATHETER CHECK MEDICATIONS: None. CONTRAST:  10 mL Omnipaque 300, intravenous FLUOROSCOPY TIME:  Eighteen seconds (8 mGy) COMPLICATIONS: None immediate. TECHNIQUE: The procedure, risks, benefits, and alternatives were explained to the patient and informed written consent was obtained. A timeout was performed prior to the initiation of the procedure. The patient's chest port a catheter was accessed by the IR RN. The patient  was placed supine on the fluoroscopy table. A preprocedural spot fluoroscopic image was obtained of the chest in existing port a catheter. Note was made of difficulty aspirating blood from the port a catheter. Contrast was injected via the Port a catheter and images were reviewed. The Port a catheter was flushed with a heparin dwell and de accessed. A dressing was placed. The patient tolerated the procedure well without immediate postprocedural complication. FINDINGS: Unchanged positioning of left anterior chest wall internal jugular vein approach port a catheter with tip projected over the expected location of the mid aspect of the SVC. There was difficulty aspirating blood from the port a catheter. Contrast injection demonstrated the tip of the catheter abutting the right lateral wall of the proximal superior vena cava. No definite evidence of fibrin sheath  or thrombus. There is no evidence of catheter kink or fracture. No contrast extravasation. IMPRESSION: Difficulty aspirating of the Port a catheter is secondary to malpositioned catheter tip against the right lateral wall of the superior vena cava. PLAN: After discussion of findings with the patient, the patient requests new Port-A-Cath placement. This will be her fourth Port-A-Cath which have been on both the right and left sides. She states history of difficulty cannulating the right internal jugular vein hence the current port being placed on the left. No prior cross-sectional imaging is available upon my review at this time. IR will arrange for new port placement. Ruthann Cancer, MD [facility] Radiology Electronically Signed   By: Ruthann Cancer M.D.   On: 08/23/2021 15:33   DG Chest Port 1 View  Result Date: 09/01/2021 CLINICAL DATA:  Generalized weakness, lightheadedness EXAM: PORTABLE CHEST 1 VIEW COMPARISON:  08/19/2021 FINDINGS: Left chest wall port catheter is in similar position. No new consolidation  or edema. Left suprahilar patchy density is unchanged. No pleural effusion. Normal heart size. IMPRESSION: No acute process in the chest.  Nodules on PET CT are not well seen. Electronically Signed   By: Macy Mis M.D.   On: 09/01/2021 12:35   DG C-Arm 1-60 Min-No Report  Result Date: 09/01/2021 Fluoroscopy was utilized by the requesting physician.  No radiographic interpretation.   CT Renal Stone Study  Result Date: 09/01/2021 CLINICAL DATA:  Kidney pain. Hematuria. History of cervical cancer. EXAM: CT ABDOMEN AND PELVIS WITHOUT CONTRAST TECHNIQUE: Multidetector CT imaging of the abdomen and pelvis was performed following the standard protocol without IV contrast. RADIATION DOSE REDUCTION: This exam was performed according to the departmental dose-optimization program which includes automated exposure control, adjustment of the mA and/or kV according to patient size and/or use of iterative reconstruction technique. COMPARISON:  PET-CT dated August 24, 2021. FINDINGS: Lower chest: No acute abnormality. Hepatobiliary: No focal liver abnormality. Unchanged gallstone. No gallbladder wall thickening or biliary dilatation. Pancreas: Unremarkable. No pancreatic ductal dilatation or surrounding inflammatory changes. Spleen: Normal in size without focal abnormality. Adrenals/Urinary Tract: Adrenal glands are unremarkable. Unchanged moderate right and decreased now mild left hydroureteronephrosis. New foci of air within the left renal collecting system. Unchanged punctate calculus in the posterior right kidney upper pole. No ureteral calculi. Bilateral renovascular calcifications again noted. Significantly thick-walled, decompressed bladder. Stomach/Bowel: Stomach is within normal limits. Appendix appears normal. No evidence of bowel wall thickening, distention, or inflammatory changes. Vascular/Lymphatic: Aortic atherosclerosis. Infrarenal IVC filter again noted. No enlarged abdominal or pelvic lymph nodes.  Reproductive: Status post hysterectomy. No adnexal masses. Other: No free fluid or pneumoperitoneum. Prior ventral hernia repair with unchanged mild anterior abdominal diastasis. Musculoskeletal: No acute or significant osseous findings. IMPRESSION: 1. New left emphysematous pyelitis. Unchanged moderate right and decreased now mild left hydroureteronephrosis. 2. Unchanged punctate right nephrolithiasis. 3. Significantly thick-walled, decompressed bladder, consistent with cystitis. 4. Unchanged cholelithiasis. 5. Aortic Atherosclerosis (ICD10-I70.0). Electronically Signed   By: Titus Dubin M.D.   On: 09/01/2021 14:13   IR NEPHROSTOMY PLACEMENT LEFT  Result Date: 09/03/2021 INDICATION: 56 year old woman with history of cervical malignancy and hematuria was found to have bilateral hydronephrosis. She underwent successful retrograde ureteral stent placement are on the right. IR consulted for left nephrostomy drain placement. EXAM: Ultrasound and fluoroscopy guided left percutaneous nephrostomy drain placement COMPARISON:  None. MEDICATIONS: Rocephin 1 g IV; The antibiotic was administered in an appropriate time frame prior to skin puncture. ANESTHESIA/SEDATION: Fentanyl 1 mcg IV;  Versed 50 mg IV Moderate Sedation Time:  10 minutes The patient was continuously monitored during the procedure by the interventional radiology nurse under my direct supervision. CONTRAST:  5 mL of Omnipaque 350-administered into the collecting system(s) FLUOROSCOPY TIME:  Fluoroscopy Time: 1 minutes 12 seconds (4 mGy). COMPLICATIONS: None immediate. PROCEDURE: Informed written consent was obtained from the patient after a thorough discussion of the procedural risks, benefits and alternatives. All questions were addressed. Maximal Sterile Barrier Technique was utilized including caps, mask, sterile gowns, sterile gloves, sterile drape, hand hygiene and skin antiseptic. A timeout was performed prior to the initiation of the procedure.  Patient position prone on the procedure table. The left flank skin prepped and draped usual fashion. Sterile ultrasound probe cover and gel utilized throughout the procedure. Following local lidocaine administration, 21 gauge needle was inserted into a midpole calyx utilizing continuous ultrasound guidance. Urine return was noted from the needle hub. The needle was exchanged for a transitional dilator set over 0.018 inch guidewire. Transitional dilator set exchanged for 10.2 [place] multipurpose pigtail drain over 0.035 inch guidewire. Contrast administered through the drain confirmed appropriate positioning of the drain within the renal collecting system. Drain was secured to skin with suture and connected to bag. IMPRESSION: 10.2 French left nephrostomy drain placed utilizing ultrasound and fluoroscopic guidance. PLAN: Patient can be considered for left ureteral stent placement next week. Electronically Signed   By: Miachel Roux M.D.   On: 09/03/2021 12:29   VAS [place] LOWER EXTREMITY VENOUS (DVT)  Result Date: 09/04/2021  Lower Venous DVT Study Patient Name:  TREINA ARSCOTT  Date of Exam:   09/03/2021 Medical Rec #: [id number]     Accession #:    [id number] Date of Birth: 1966-01-14     Patient Gender: F Patient Age:   6 years Exam Location:  [facility] Procedure:      VAS [place] LOWER EXTREMITY VENOUS (DVT) Referring Phys: Burney Gauze --------------------------------------------------------------------------------  Indications: Follow up DVT of right lower extremity.  Risk Factors: Cancer ovarian/uterine. Anticoagulation: Eliquis. Comparison Study: Prior studies on 01/18/21 and 02/04/21 performed at outside                   facility in [place] were positive for extensive DVT of the                   right lower extremity. Performing Technologist: Darlin Coco RDMS, RVT  Examination Guidelines: A complete evaluation includes B-mode imaging, spectral Doppler, color Doppler, and power Doppler as needed of all  accessible portions of each vessel. Bilateral testing is considered an integral part of a complete examination. Limited examinations for reoccurring indications may be performed as noted. The reflux portion of the exam is performed with the patient in reverse Trendelenburg.  +---------+---------------+---------+-----------+----------+-----------------+  RIGHT     Compressibility Phasicity Spontaneity Properties Thrombus Aging     +---------+---------------+---------+-----------+----------+-----------------+  CFV       Partial         Yes       Yes                    Age Indeterminate  +---------+---------------+---------+-----------+----------+-----------------+  SFJ       Partial         Yes       Yes                    Age Indeterminate  +---------+---------------+---------+-----------+----------+-----------------+  FV Prox   Full  Yes       Yes                    Chronic            +---------+---------------+---------+-----------+----------+-----------------+  FV Mid    None            No        No                     Chronic            +---------+---------------+---------+-----------+----------+-----------------+  FV Distal None            No        No                     Chronic            +---------+---------------+---------+-----------+----------+-----------------+  PFV       Partial         Yes       Yes                    Age Indeterminate  +---------+---------------+---------+-----------+----------+-----------------+  POP       Partial         Yes       Yes                    Chronic            +---------+---------------+---------+-----------+----------+-----------------+  PTV       Full                                                                +---------+---------------+---------+-----------+----------+-----------------+  PERO      Partial         No        No                     Age Indeterminate  +---------+---------------+---------+-----------+----------+-----------------+  Gastroc    Partial                                                             +---------+---------------+---------+-----------+----------+-----------------+  EIV       Partial         Yes       Yes                    Age Indeterminate  +---------+---------------+---------+-----------+----------+-----------------+   +----+---------------+---------+-----------+----------+--------------+  LEFT Compressibility Phasicity Spontaneity Properties Thrombus Aging  +----+---------------+---------+-----------+----------+--------------+  CFV  Full            Yes       Yes                                    +----+---------------+---------+-----------+----------+--------------+     Summary: RIGHT: - Findings consistent with partial, age indeterminate deep vein thrombosis involving the right common femoral vein, SF junction, right proximal profunda vein, and  right peroneal veins. - Findings consistent with chronic deep vein thrombosis involving the right femoral vein, and right popliteal vein.  - A cystic structure is found in the popliteal fossa.  - Partial, age indeterminate deep vein thrombosis extends proximal to the inguinal ligament.  LEFT: - No evidence of common femoral vein obstruction.  *See table(s) above for measurements and observations. Electronically signed by Jamelle Haring on 09/04/2021 at 10:40:06 AM.    Final     Microbiology: Results for orders placed or performed during the hospital encounter of 09/01/21  Resp Panel by RT-PCR (Flu A&B, Covid) Nasopharyngeal Swab     Status: None   Collection Time: 09/01/21 10:52 AM   Specimen: Nasopharyngeal Swab; Nasopharyngeal(NP) swabs in vial transport medium  Result Value Ref Range Status   SARS Coronavirus 2 by RT PCR NEGATIVE NEGATIVE Final    Comment: (NOTE) SARS-CoV-2 target nucleic acids are NOT DETECTED.  The SARS-CoV-2 RNA is generally detectable in upper respiratory specimens during the acute phase of infection. The lowest concentration of SARS-CoV-2 viral  copies this assay can detect is 138 copies/mL. A negative result does not preclude SARS-Cov-2 infection and should not be used as the sole basis for treatment or other patient management decisions. A negative result may occur with  improper specimen collection/handling, submission of specimen other than nasopharyngeal swab, presence of viral mutation(s) within the areas targeted by this assay, and inadequate number of viral copies(<138 copies/mL). A negative result must be combined with clinical observations, patient history, and epidemiological information. The expected result is Negative.  Fact Sheet for Patients:  EntrepreneurPulse.com.au  Fact Sheet for Healthcare Providers:  IncredibleEmployment.be  This test is no t yet approved or cleared by the [place] FDA and  has been authorized for detection and/or diagnosis of SARS-CoV-2 by FDA under an Emergency Use Authorization (EUA). This EUA will remain  in effect (meaning this test can be used) for the duration of the COVID-19 declaration under Section 564(b)(1) of the Act, 21 U.S.C.section 360bbb-3(b)(1), unless the authorization is terminated  or revoked sooner.       Influenza A by PCR NEGATIVE NEGATIVE Final   Influenza B by PCR NEGATIVE NEGATIVE Final    Comment: (NOTE) The Xpert Xpress SARS-CoV-2/FLU/RSV plus assay is intended as an aid in the diagnosis of influenza from Nasopharyngeal swab specimens and should not be used as a sole basis for treatment. Nasal washings and aspirates are unacceptable for Xpert Xpress SARS-CoV-2/FLU/RSV testing.  Fact Sheet for Patients: EntrepreneurPulse.com.au  Fact Sheet for Healthcare Providers: IncredibleEmployment.be  This test is not yet approved or cleared by the [place] FDA and has been authorized for detection and/or diagnosis of SARS-CoV-2 by FDA under an Emergency Use Authorization (EUA). This  EUA will remain in effect (meaning this test can be used) for the duration of the COVID-19 declaration under Section 564(b)(1) of the Act, 21 U.S.C. section 360bbb-3(b)(1), unless the authorization is terminated or revoked.  Performed at [facility], [address] [place], [place] [address]   Urine Culture     Status: Abnormal   Collection Time: 09/01/21 12:04 PM   Specimen: In/Out Cath Urine  Result Value Ref Range Status   Specimen Description   Final    IN/OUT CATH URINE Performed at [facility], [address] [place], [place] [address]    Special Requests   Final    NONE Performed at [facility], [address] [place],  [place] [address]    Culture MULTIPLE SPECIES PRESENT, SUGGEST RECOLLECTION (A)  Final   Report Status 09/03/2021 FINAL  Final    Labs: CBC: Recent Labs  Lab 09/01/21 1044 09/02/21 0443 09/03/21 0727 09/04/21 0719 09/05/21 0420  WBC 6.1 1.8* 12.4* 9.3 9.8  NEUTROABS 5.7  --  11.3*  --   --   HGB 9.9* 8.5* 9.8* 8.5* 7.7*  HCT 30.3* 26.5* 30.1* 25.5* 23.8*  MCV 88.3 90.1 89.1 88.9 90.2  PLT 179 111* 147* 114* 552*   Basic Metabolic Panel: Recent Labs  Lab 09/01/21 1044 09/02/21 0443 09/03/21 0727 09/04/21 0719 09/05/21 0420  NA 133* 139 140 140 141  K 5.1 5.2* 4.4 3.8 3.7  CL 104 113* 113* 116* 118*  CO2 8* 11* 11* 10* 10*  GLUCOSE 217* 169* 242* 80 66*  BUN 122* 118* 123* 103* 95*  CREATININE 8.45* 7.61* 7.17* 6.43* 5.69*  CALCIUM 8.9 8.0* 7.9* 7.2* 7.1*  MG  --   --  2.0 1.7 1.5*  PHOS  --  11.3* 11.4*  --  6.6*   Liver Function Tests: Recent Labs  Lab 09/01/21 1044 09/02/21 0443 09/05/21 0420  AST 144*  --  76*  ALT 27  --  29  ALKPHOS 84  --  95  BILITOT 0.9  --  0.1*  PROT 7.9  --  5.9*  ALBUMIN 3.0* 2.4* 2.5*   CBG: Recent Labs  Lab 09/04/21 1651 09/04/21 2116 09/05/21 0549 09/05/21 0657 09/05/21 0732  GLUCAP 147* 103* 58* 74 74    Discharge time spent: greater than 30  minutes.  Signed: Flora Lipps, MD [facility] 09/05/2021

## 2021-09-05 NOTE — Progress Notes (Signed)
Pt vomited while drinking OJ  around 0610  and she said that she couldn't tolerate PO intake. RN ordered 50% dextrose and she refused it when RN tried to administered. She said that she wanted to drink cranberry juice instead of dextrose. Cancelled dextrose order and reordered PO intake for her. Around 0640, pt finished drinking cranberry juice mixed w/ 5 packets of sugar. Rechecked 0700 sugar w/ glucometer and it was 74.  Berton Mount, Therapist, sports

## 2021-09-05 NOTE — Plan of Care (Signed)
PIV removed. DC paperwork reviewed. ° ° °Problem: Clinical Measurements: °Goal: Will remain free from infection °Outcome: Completed/Met °Goal: Diagnostic test results will improve °Outcome: Completed/Met °  °Problem: Education: °Goal: Knowledge of General Education information will improve °Description: Including pain rating scale, medication(s)/side effects and non-pharmacologic comfort measures °Outcome: Completed/Met °  °Problem: Health Behavior/Discharge Planning: °Goal: Ability to manage health-related needs will improve °Outcome: Completed/Met °  °Problem: Clinical Measurements: °Goal: Ability to maintain clinical measurements within normal limits will improve °Outcome: Completed/Met °Goal: Will remain free from infection °Outcome: Completed/Met °Goal: Diagnostic test results will improve °Outcome: Completed/Met °Goal: Respiratory complications will improve °Outcome: Completed/Met °Goal: Cardiovascular complication will be avoided °Outcome: Completed/Met °  °Problem: Activity: °Goal: Risk for activity intolerance will decrease °Outcome: Completed/Met °  °Problem: Nutrition: °Goal: Adequate nutrition will be maintained °Outcome: Completed/Met °  °Problem: Coping: °Goal: Level of anxiety will decrease °Outcome: Completed/Met °  °Problem: Elimination: °Goal: Will not experience complications related to bowel motility °Outcome: Completed/Met °Goal: Will not experience complications related to urinary retention °Outcome: Completed/Met °  °Problem: Pain Managment: °Goal: General experience of comfort will improve °Outcome: Completed/Met °  °Problem: Safety: °Goal: Ability to remain free from injury will improve °Outcome: Completed/Met °  °Problem: Skin Integrity: °Goal: Risk for impaired skin integrity will decrease °Outcome: Completed/Met °  °

## 2021-09-05 NOTE — Progress Notes (Signed)
Pt's blood glucose level from lab was 66. Rechecked pt's sugar w/ glucometer at 0550 and it was 58. Pt said that she was fine. 239ml OJ was provided and will recheck it in 15 min.  Pt just called that she vomited while drinking OJ.

## 2021-09-05 NOTE — Progress Notes (Signed)
She is feeling better.  She had the left nephrostomy tube placed on Saturday.  I appreciate IR doing this.  Her renal function is improving nicely.  The BUN is 95 creatinine 5.69.  Calcium 7.1 with an albumin of 2.5.  Her hemoglobin is 7.7.  She feels okay.  We will hold off on transfusing her.  She had a Doppler of her right leg over the weekend.  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  This is fairly extensive.  She does have an IVC in.  She is back on Eliquis right now.  Cultures were all negative.  We can probably get the Port-A-Cath replaced.  She would like to have this done as an outpatient if she is to go home today.  She seems to be eating okay.  Blood sugar was a little bit low today.  She was not symptomatic.  Her white cell count has come quite nicely with 1 dose of Neupogen.  I think she can go back on to Afinitor when she goes home.  Her vital signs show temperature of 97.8.  Pulse 95.  Blood pressure 145/84.  Her lungs are clear.  Cardiac exam regular rate and rhythm.  Abdomen is soft.  Bowel sounds are present.  She has no guarding or rebound tenderness.  Extremity shows a little bit of swelling in the right leg.  Neurological exam is nonfocal.  Jaime Gomez renal function is getting better nicely.  The nephrostomy tube certainly is helping.  Again she would like to go home today.  I have no problems with her going home today since she is feeling well and there is no infection.  Does not look like there is active cancer causing her issues.  This all looks like it might be from past radiation that she took.  We can get the Port-A-Cath set up as an outpatient.  I can try to get her back to the office and have her labs checked to see if she will need to be transfused.  I appreciate the incredible care that she got from the wonderful staff on [address].  Lattie Haw, MD  Psalms 139:10

## 2021-09-06 ENCOUNTER — Inpatient Hospital Stay (HOSPITAL_COMMUNITY)
Admission: RE | Admit: 2021-09-06 | Discharge: 2021-09-06 | Disposition: A | Discharge status: Home / Self Care | Payer: BC Managed Care – PPO | Source: Ambulatory Visit | Attending: Interventional Radiology | Admitting: Interventional Radiology

## 2021-09-06 ENCOUNTER — Ambulatory Visit (HOSPITAL_COMMUNITY): Payer: BC Managed Care – PPO

## 2021-09-06 ENCOUNTER — Other Ambulatory Visit: Payer: Self-pay | Admitting: Radiology

## 2021-09-07 ENCOUNTER — Other Ambulatory Visit: Payer: Self-pay

## 2021-09-07 DIAGNOSIS — C530 Malignant neoplasm of endocervix: Secondary | ICD-10-CM

## 2021-09-08 ENCOUNTER — Inpatient Hospital Stay: Payer: BC Managed Care – PPO

## 2021-09-08 ENCOUNTER — Inpatient Hospital Stay: Payer: BC Managed Care – PPO | Admitting: Hematology & Oncology

## 2021-09-08 ENCOUNTER — Encounter: Payer: Self-pay | Admitting: *Deleted

## 2021-09-08 NOTE — Progress Notes (Signed)
Patient was a no-show to today's appointment. She was discharged from the hospital on 2/13. She cancelled her port placement appointment on 2/14 and rescheduled for 2/28. Spoke to Dr Marin Olp and he would like to reschedule patient to after her port placement. Message sent to schedulers.   Oncology Nurse Navigator Documentation  Oncology Nurse Navigator Flowsheets 09/08/2021  Navigator Follow Up Date: 09/22/2021  Navigator Follow Up Reason: Follow-up Appointment  Navigator Location [facility]  Referral Date to RadOnc/MedOnc -  Navigator Encounter Type Appt/Treatment Plan Review  Telephone -  Patient Visit Type MedOnc  Treatment Phase Active Tx  Barriers/Navigation Needs Coordination of Care  Interventions Coordination of Care  Acuity Level 2-Minimal Needs (1-2 Barriers Identified)  Coordination of Care Appts  Education Method -  Support Groups/Services Friends and Family  Time Spent with Patient 15

## 2021-09-09 ENCOUNTER — Telehealth: Payer: Self-pay | Admitting: *Deleted

## 2021-09-09 NOTE — Telephone Encounter (Signed)
Per scheduling message - Jaime Gomez - Called and lvm of upcoming appointment -requested call back to confirm.

## 2021-09-10 ENCOUNTER — Telehealth (HOSPITAL_BASED_OUTPATIENT_CLINIC_OR_DEPARTMENT_OTHER): Payer: Self-pay

## 2021-09-19 ENCOUNTER — Other Ambulatory Visit: Payer: Self-pay | Admitting: Radiology

## 2021-09-20 ENCOUNTER — Inpatient Hospital Stay (HOSPITAL_COMMUNITY): Admission: RE | Admit: 2021-09-20 | Payer: BC Managed Care – PPO | Source: Ambulatory Visit

## 2021-09-20 ENCOUNTER — Ambulatory Visit (HOSPITAL_COMMUNITY): Payer: BC Managed Care – PPO

## 2021-09-20 ENCOUNTER — Encounter: Payer: Self-pay | Admitting: *Deleted

## 2021-09-20 NOTE — Progress Notes (Signed)
Patient was a no-show to her port placement appointment. This is the second no-show appointment. Dr Marin Olp believes that the patient may have returned to [facility].  I called and left a voicemail requesting that she call back and explain her situation and if she still need follow up with this office.   She is also scheduled for follow up with this office on 09/22/2021.  Oncology Nurse Navigator Documentation  Oncology Nurse Navigator Flowsheets 09/20/2021  Navigator Follow Up Date: 09/22/2021  Navigator Follow Up Reason: Follow-up Appointment  Navigator Location [facility]  Referral Date to RadOnc/MedOnc -  Navigator Encounter Type Telephone  Telephone Appt Confirmation/Clarification;Outgoing Call  Patient Visit Type MedOnc  Treatment Phase Active Tx  Barriers/Navigation Needs Coordination of Care  Interventions Coordination of Care;Education  Acuity Level 2-Minimal Needs (1-2 Barriers Identified)  Coordination of Care -  Education Method Verbal  Support Groups/Services Friends and Family  Time Spent with Patient 15

## 2021-09-22 ENCOUNTER — Encounter: Payer: Self-pay | Admitting: *Deleted

## 2021-09-22 ENCOUNTER — Inpatient Hospital Stay: Payer: BC Managed Care – PPO | Attending: Hematology & Oncology

## 2021-09-22 ENCOUNTER — Inpatient Hospital Stay: Payer: BC Managed Care – PPO | Admitting: Hematology & Oncology

## 2021-09-22 NOTE — Progress Notes (Signed)
Patient is a no show to todays appointment. She has not responded to any of our attempts to make contact. She previously had told Dr Marin Olp she was planning on moving back to [facility]. Will discontinue active navigation.  ? ?Oncology Nurse Navigator Documentation ? ?Oncology Nurse Navigator Flowsheets 09/22/2021  ?Navigator Follow Up Date: -  ?Navigator Follow Up Reason: -  ?Navigation Complete Date: 09/22/2021  ?Post Navigation: Continue to Follow Patient? No  ?Navigator Location [facility]  ?Referral Date to RadOnc/MedOnc -  ?Navigator Encounter Type Appt/Treatment Plan Review  ?Telephone -  ?Patient Visit Type MedOnc  ?Treatment Phase Active Tx  ?Barriers/Navigation Needs Coordination of Care  ?Interventions None Required  ?Acuity Level 2-Minimal Needs (1-2 Barriers Identified)  ?Coordination of Care -  ?Education Method -  ?Support Groups/Services -  ?Time Spent with Patient 15  ?  ?

## 2021-10-03 ENCOUNTER — Other Ambulatory Visit: Payer: Self-pay | Admitting: *Deleted

## 2021-10-03 MED ORDER — OXYCODONE HCL 5 MG PO CAPS: 5.0000 mg | ORAL_CAPSULE | ORAL | 0 refills | Status: DC | PRN

## 2021-10-10 ENCOUNTER — Other Ambulatory Visit (HOSPITAL_COMMUNITY): Payer: Self-pay | Admitting: Urology

## 2021-10-10 DIAGNOSIS — N1339 Other hydronephrosis: Secondary | ICD-10-CM

## 2021-11-01 ENCOUNTER — Other Ambulatory Visit: Payer: Self-pay

## 2021-11-01 MED ORDER — OXYCODONE HCL 5 MG PO CAPS: 5.0000 mg | ORAL_CAPSULE | ORAL | 0 refills | Status: DC | PRN

## 2021-11-02 ENCOUNTER — Ambulatory Visit (HOSPITAL_COMMUNITY)
Admission: RE | Admit: 2021-11-02 | Discharge: 2021-11-02 | Disposition: A | Discharge status: Home / Self Care | Payer: BC Managed Care – PPO | Source: Ambulatory Visit | Attending: Urology | Admitting: Urology

## 2021-11-02 ENCOUNTER — Other Ambulatory Visit (HOSPITAL_COMMUNITY): Payer: Self-pay | Admitting: Urology

## 2021-11-02 DIAGNOSIS — Z436 Encounter for attention to other artificial openings of urinary tract: Secondary | ICD-10-CM | POA: Diagnosis present

## 2021-11-02 DIAGNOSIS — N1339 Other hydronephrosis: Secondary | ICD-10-CM | POA: Diagnosis not present

## 2021-11-02 HISTORY — PX: IR CONVERT LEFT NEPHROSTOMY TO NEPHROURETERAL CATH: IMG6067

## 2021-11-02 HISTORY — PX: IR NEPHROSTOMY EXCHANGE LEFT: IMG6069

## 2021-11-02 MED ORDER — IOHEXOL 300 MG/ML  SOLN
50.0000 mL | Freq: Once | INTRAMUSCULAR | Status: AC | PRN
  Administered 2021-11-02: 15 mL

## 2021-11-02 MED ORDER — LIDOCAINE HCL 1 % IJ SOLN
INTRAMUSCULAR | Status: AC
  Filled 2021-11-02: qty 20

## 2021-11-07 ENCOUNTER — Inpatient Hospital Stay (HOSPITAL_BASED_OUTPATIENT_CLINIC_OR_DEPARTMENT_OTHER): Payer: BC Managed Care – PPO | Admitting: Hematology & Oncology

## 2021-11-07 ENCOUNTER — Telehealth: Payer: Self-pay | Admitting: *Deleted

## 2021-11-07 ENCOUNTER — Other Ambulatory Visit: Payer: Self-pay | Admitting: *Deleted

## 2021-11-07 ENCOUNTER — Encounter: Payer: Self-pay | Admitting: Hematology & Oncology

## 2021-11-07 ENCOUNTER — Inpatient Hospital Stay: Payer: BC Managed Care – PPO | Attending: Hematology & Oncology

## 2021-11-07 ENCOUNTER — Other Ambulatory Visit: Payer: Self-pay

## 2021-11-07 VITALS — BP 145/64 | HR 85 | Temp 98.8°F | Resp 18 | Ht 65.0 in | Wt 151.0 lb

## 2021-11-07 DIAGNOSIS — C530 Malignant neoplasm of endocervix: Secondary | ICD-10-CM

## 2021-11-07 DIAGNOSIS — C541 Malignant neoplasm of endometrium: Secondary | ICD-10-CM | POA: Insufficient documentation

## 2021-11-07 DIAGNOSIS — D649 Anemia, unspecified: Secondary | ICD-10-CM | POA: Insufficient documentation

## 2021-11-07 DIAGNOSIS — N189 Chronic kidney disease, unspecified: Secondary | ICD-10-CM | POA: Insufficient documentation

## 2021-11-07 LAB — CBC WITH DIFFERENTIAL (CANCER CENTER ONLY)
Abs Immature Granulocytes: 0.05 10*3/uL (ref 0.00–0.07)
Basophils Absolute: 0 10*3/uL (ref 0.0–0.1)
Basophils Relative: 1 %
Eosinophils Absolute: 0.2 10*3/uL (ref 0.0–0.5)
Eosinophils Relative: 4 %
HCT: 20.6 % — ABNORMAL LOW (ref 36.0–46.0)
Hemoglobin: 6.4 g/dL — CL (ref 12.0–15.0)
Immature Granulocytes: 1 %
Lymphocytes Relative: 25 %
Lymphs Abs: 1.4 10*3/uL (ref 0.7–4.0)
MCH: 29.2 pg (ref 26.0–34.0)
MCHC: 31.1 g/dL (ref 30.0–36.0)
MCV: 94.1 fL (ref 80.0–100.0)
Monocytes Absolute: 0.5 10*3/uL (ref 0.1–1.0)
Monocytes Relative: 9 %
Neutro Abs: 3.4 10*3/uL (ref 1.7–7.7)
Neutrophils Relative %: 60 %
Platelet Count: 312 10*3/uL (ref 150–400)
RBC: 2.19 MIL/uL — ABNORMAL LOW (ref 3.87–5.11)
RDW: 15.8 % — ABNORMAL HIGH (ref 11.5–15.5)
WBC Count: 5.6 10*3/uL (ref 4.0–10.5)
nRBC: 0 % (ref 0.0–0.2)

## 2021-11-07 LAB — PREPARE RBC (CROSSMATCH)

## 2021-11-07 LAB — CMP (CANCER CENTER ONLY)
ALT: 7 U/L (ref 0–44)
AST: 11 U/L — ABNORMAL LOW (ref 15–41)
Albumin: 3 g/dL — ABNORMAL LOW (ref 3.5–5.0)
Alkaline Phosphatase: 65 U/L (ref 38–126)
Anion gap: 10 (ref 5–15)
BUN: 64 mg/dL — ABNORMAL HIGH (ref 6–20)
CO2: 18 mmol/L — ABNORMAL LOW (ref 22–32)
Calcium: 9.2 mg/dL (ref 8.9–10.3)
Chloride: 112 mmol/L — ABNORMAL HIGH (ref 98–111)
Creatinine: 3.46 mg/dL (ref 0.44–1.00)
GFR, Estimated: 15 mL/min — ABNORMAL LOW (ref >60)
Glucose, Bld: 138 mg/dL — ABNORMAL HIGH (ref 70–99)
Potassium: 3.8 mmol/L (ref 3.5–5.1)
Sodium: 140 mmol/L (ref 135–145)
Total Bilirubin: 0.1 mg/dL — ABNORMAL LOW (ref 0.3–1.2)
Total Protein: 7.1 g/dL (ref 6.5–8.1)

## 2021-11-07 LAB — PREALBUMIN: Prealbumin: 17.6 mg/dL — ABNORMAL LOW (ref 18–38)

## 2021-11-07 LAB — SAMPLE TO BLOOD BANK

## 2021-11-07 MED ORDER — NITROFURANTOIN MONOHYD MACRO 100 MG PO CAPS: 100.0000 mg | ORAL_CAPSULE | Freq: Two times a day (BID) | ORAL | 6 refills | Status: DC

## 2021-11-07 NOTE — Progress Notes (Signed)
?Hematology and Oncology Follow Up Visit ? ?Jaime Gomez ?[id number] ?1965-10-24 56 y.o. ?11/07/2021 ? ? ?Principle Diagnosis:  ?Metastatic adenocarcinoma of the endometrium ?Chronic renal failure ? ?Current Therapy:   ?Afinitor/letrozole ?Blood transfusion as needed ?    ?Interim History:  [name] is back for follow-up.  This is been a long awaited follow-up.  First saw her back in January.  That time, she had moved here from [facility].  She was diagnosed out there with metastatic endometrial cancer.  She underwent numerous courses of chemotherapy.  She had immunotherapy.  She finally was placed on Afinitor and letrozole.  This seemed to work for her. ? ?We saw her, her CA-125 was 172. ? ?We did do a PET scan on her when we saw her initially.  She had disease in her lungs with some small nodularity.  Outside of that, she really did not have a lot of disease in the abdomen or pelvis. ? ?One of her problems is her renal insufficiency.  She has chronic renal insufficiency.  She does see nephrology and urology.  I am not sure she is on dialysis. ? ?Her pain seems to be doing pretty well right now.  She is only taking oxycodone which she only takes 2-3 times a day. ? ?She does feel tired.  Her hemoglobin today is 6.4.  She probably needs to be transfused.  She has history of blood clots.  She has a IVC filter in.  She is on Eliquis. ? ?She does have chronic UTIs.  She is on Macrobid. ? ?She has had no fever.  She has had no problems with COVID.  She has had no problems with nausea or vomiting.  She seems to be eating pretty well. ? ?Currently, I would not say that her performance status is probably ECOG 1. ? ?Medications:  ?Current Outpatient Medications:  ?  apixaban (ELIQUIS) 2.5 MG TABS tablet, Take 2.5 mg by mouth 2 (two) times daily., Disp: , Rfl:  ?  atorvastatin (LIPITOR) 10 MG tablet, Take 10 mg by mouth daily at 6 (six) AM., Disp: , Rfl:  ?  carvedilol (COREG) 25 MG tablet, Take 25 mg by mouth daily at 6 (six)  AM., Disp: , Rfl:  ?  everolimus (AFINITOR) 10 MG tablet, Take 10 mg by mouth daily at 6 (six) AM., Disp: , Rfl:  ?  folic acid (FOLVITE) 1 MG tablet, Take 1 mg by mouth daily at 6 (six) AM., Disp: , Rfl:  ?  gabapentin (NEURONTIN) 100 MG capsule, Take 100 mg by mouth 2 (two) times daily., Disp: , Rfl:  ?  insulin aspart protamine - aspart (NOVOLOG MIX 70/30 FLEXPEN) (70-30) 100 UNIT/ML FlexPen, Inject 20 Units into the skin as directed. Per meal, Disp: , Rfl:  ?  insulin detemir (LEVEMIR) 100 UNIT/ML injection, Inject 20 Units into the skin 2 (two) times daily., Disp: , Rfl:  ?  letrozole (FEMARA) 2.5 MG tablet, Take 2.5 mg by mouth daily at 6 (six) AM., Disp: , Rfl:  ?  magnesium oxide (MAG-OX) 400 (240 Mg) MG tablet, Take 1 tablet (400 mg total) by mouth daily., Disp: 10 tablet, Rfl:  ?  mirabegron ER (MYRBETRIQ) 25 MG TB24 tablet, Take 1 tablet (25 mg total) by mouth daily for 360 doses., Disp: 30 tablet, Rfl: 11 ?  nitrofurantoin, macrocrystal-monohydrate, (MACROBID) 100 MG capsule, Take 1 capsule (100 mg total) by mouth 2 (two) times daily., Disp: 180 capsule, Rfl: 6 ?  ondansetron (ZOFRAN) 8 MG  tablet, Take 8 mg by mouth every 8 (eight) hours as needed., Disp: , Rfl:  ?  oxycodone (OXY-IR) 5 MG capsule, Take 1 capsule (5 mg total) by mouth every 4 (four) hours as needed., Disp: 120 capsule, Rfl: 0 ?  pantoprazole (PROTONIX) 40 MG tablet, Take 40 mg by mouth in the morning., Disp: , Rfl:  ?  polyethylene glycol (MIRALAX / GLYCOLAX) 17 g packet, Take 17 g by mouth daily as needed for mild constipation., Disp: 14 each, Rfl: 0 ?  pravastatin (PRAVACHOL) 80 MG tablet, Take 80 mg by mouth daily at 6 (six) AM., Disp: , Rfl:  ? ?Allergies:  ?Allergies  ?Allergen Reactions  ? Taxotere [Docetaxel]   ?  Throat swoll  ? ? ?Past Medical History, Surgical history, Social history, and Family History were reviewed and updated. ? ?Review of Systems: ?Review of Systems  ?Constitutional:  Positive for fatigue.  ?HENT:   Negative.    ?Eyes: Negative.   ?Respiratory: Negative.    ?Cardiovascular:  Positive for leg swelling.  ?Gastrointestinal:  Positive for abdominal pain.  ?Genitourinary:  Positive for pelvic pain.   ?Musculoskeletal: Negative.   ?Skin: Negative.   ?Neurological: Negative.   ?Hematological: Negative.   ?Psychiatric/Behavioral: Negative.    ? ?Physical Exam: ? height is '5\' 5"'$  (1.651 m) and weight is 151 lb (68.5 kg). Her oral temperature is 98.8 ?F (37.1 ?C). Her blood pressure is 145/64 (abnormal) and her pulse is 85. Her respiration is 18 and oxygen saturation is 100%.  ? ?Wt Readings from Last 3 Encounters:  ?11/07/21 151 lb (68.5 kg)  ?09/01/21 141 lb 15.6 oz (64.4 kg)  ?08/18/21 146 lb (66.2 kg)  ? ? ?Physical Exam ?Vitals reviewed.  ?HENT:  ?   Head: Normocephalic and atraumatic.  ?Eyes:  ?   Pupils: Pupils are equal, round, and reactive to light.  ?Cardiovascular:  ?   Rate and Rhythm: Normal rate and regular rhythm.  ?   Heart sounds: Normal heart sounds.  ?Pulmonary:  ?   Effort: Pulmonary effort is normal.  ?   Breath sounds: Normal breath sounds.  ?Abdominal:  ?   General: Bowel sounds are normal.  ?   Palpations: Abdomen is soft.  ?Musculoskeletal:     ?   General: No tenderness or deformity. Normal range of motion.  ?   Cervical back: Normal range of motion.  ?Lymphadenopathy:  ?   Cervical: No cervical adenopathy.  ?Skin: ?   General: Skin is warm and dry.  ?   Findings: No erythema or rash.  ?Neurological:  ?   Mental Status: She is alert and oriented to person, place, and time.  ?Psychiatric:     ?   Behavior: Behavior normal.     ?   Thought Content: Thought content normal.     ?   Judgment: Judgment normal.  ? ? ? ?Lab Results  ?Component Value Date  ? WBC 5.6 11/07/2021  ? HGB 6.4 (LL) 11/07/2021  ? HCT 20.6 (L) 11/07/2021  ? MCV 94.1 11/07/2021  ? PLT 312 11/07/2021  ? ?  Chemistry   ?   ?Component Value Date/Time  ? NA 140 11/07/2021 1453  ? K 3.8 11/07/2021 1453  ? CL 112 (H) 11/07/2021 1453  ?  CO2 18 (L) 11/07/2021 1453  ? BUN 64 (H) 11/07/2021 1453  ? CREATININE 3.46 (HH) 11/07/2021 1453  ?    ?Component Value Date/Time  ? CALCIUM 9.2 11/07/2021 1453  ? ALKPHOS  65 11/07/2021 1453  ? AST 11 (L) 11/07/2021 1453  ? ALT 7 11/07/2021 1453  ? BILITOT 0.1 (L) 11/07/2021 1453  ?  ? ? ?Impression and Plan: ?Ms. Crisafulli is a very charming 56 year old white female.  She has metastatic endometrial cancer.  She is on Afinitor/letrozole.  Again she is doing well with this as far as I can tell.  We will have to see what her CA-125 is..  She clearly needs to be transfused.  We will have to set this up for 2 days.  I am glad that her renal function is better.  Her BUN is 64 creatinine 3.46.  I am certainly impressed by this. ? ?We probably do need to get another PET scan on her.  I think this would be very reasonable.  The last scan was done early February. ? ?For right now, we will transfuse her.  We are dealing with quality of life.  This is clearly was can be important. ? ?I will plan to see her back after the PET scan is done. ? ? ?[name], MD ?4/17/20234:49 PM  ?

## 2021-11-07 NOTE — Telephone Encounter (Signed)
Critical Hgb of 6.4 reported to me by Judeen Hammans in lab.  Dr Marin Olp notified.  Seeing patient in exam room today.   ?

## 2021-11-07 NOTE — Telephone Encounter (Signed)
Critical Creatinine of 3.46 reported by [facility] in Lab.  Dr Marin Olp in exam room with her.  No orders received ?

## 2021-11-08 LAB — CA 125: Cancer Antigen (CA) 125: 143 U/mL — ABNORMAL HIGH (ref 0.0–38.1)

## 2021-11-09 ENCOUNTER — Inpatient Hospital Stay: Payer: BC Managed Care – PPO

## 2021-11-09 ENCOUNTER — Other Ambulatory Visit: Payer: Self-pay | Admitting: *Deleted

## 2021-11-09 ENCOUNTER — Encounter: Payer: Self-pay | Admitting: *Deleted

## 2021-11-09 DIAGNOSIS — D649 Anemia, unspecified: Secondary | ICD-10-CM | POA: Diagnosis not present

## 2021-11-09 DIAGNOSIS — C541 Malignant neoplasm of endometrium: Secondary | ICD-10-CM

## 2021-11-09 MED ORDER — DIPHENHYDRAMINE HCL 25 MG PO CAPS
25.0000 mg | ORAL_CAPSULE | Freq: Once | ORAL | Status: AC
  Administered 2021-11-09: 25 mg via ORAL

## 2021-11-09 MED ORDER — ACETAMINOPHEN 325 MG PO TABS
650.0000 mg | ORAL_TABLET | Freq: Once | ORAL | Status: AC
  Administered 2021-11-09: 650 mg via ORAL

## 2021-11-09 MED ORDER — SODIUM CHLORIDE 0.9% IV SOLUTION
250.0000 mL | Freq: Once | INTRAVENOUS | Status: AC
  Administered 2021-11-09: 250 mL via INTRAVENOUS

## 2021-11-09 NOTE — Patient Instructions (Signed)
Blood Transfusion, Adult A blood transfusion is a procedure in which you receive blood or a type of blood cell (blood component) through an IV. You may need a blood transfusion when your blood level is low. This may result from a bleeding disorder, illness, injury, or surgery. The blood may come from a donor. You may also be able to donate blood for yourself (autologous blood donation) before a planned surgery. The blood given in a transfusion is made up of different blood components. You may receive: Red blood cells. These carry oxygen to the cells in the body. Platelets. These help your blood to clot. Plasma. This is the liquid part of your blood. It carries proteins and other substances throughout the body. White blood cells. These help you fight infections. If you have hemophilia or another clotting disorder, you may also receive other types of blood products. Tell a health care provider about: Any blood disorders you have. Any previous reactions you have had during a blood transfusion. Any allergies you have. All medicines you are taking, including vitamins, herbs, eye drops, creams, and over-the-counter medicines. Any surgeries you have had. Any medical conditions you have, including any recent fever or cold symptoms. Whether you are pregnant or may be pregnant. What are the risks? Generally, this is a safe procedure. However, problems may occur. The most common problems include: A mild allergic reaction, such as red, swollen areas of skin (hives) and itching. Fever or chills. This may be the body's response to new blood cells received. This may occur during or up to 4 hours after the transfusion. More serious problems may include: Transfusion-associated circulatory overload (TACO), or too much fluid in the lungs. This may cause breathing problems. A serious allergic reaction, such as difficulty breathing or swelling around the face and lips. Transfusion-related acute lung injury  (TRALI), which causes breathing difficulty and low oxygen in the blood. This can occur within hours of the transfusion or several days later. Iron overload. This can happen after receiving many blood transfusions over a period of time. Infection or virus being transmitted. This is rare because donated blood is carefully tested before it is given. Hemolytic transfusion reaction. This is rare. It happens when your body's defense system (immune system)tries to attack the new blood cells. Symptoms may include fever, chills, nausea, low blood pressure, and low back or chest pain. Transfusion-associated graft-versus-host disease (TAGVHD). This is rare. It happens when donated cells attack your body's healthy tissues. What happens before the procedure? Medicines Ask your health care provider about: Changing or stopping your regular medicines. This is especially important if you are taking diabetes medicines or blood thinners. Taking medicines such as aspirin and ibuprofen. These medicines can thin your blood. Do not take these medicines unless your health care provider tells you to take them. Taking over-the-counter medicines, vitamins, herbs, and supplements. General instructions Follow instructions from your health care provider about eating and drinking restrictions. You will have a blood test to determine your blood type. This is necessary to know what kind of blood your body will accept and to match it to the donor blood. If you are going to have a planned surgery, you may be able to do an autologous blood donation. This may be done in case you need to have a transfusion. You will have your temperature, blood pressure, and pulse monitored before the transfusion. If you have had an allergic reaction to a transfusion in the past, you may be given medicine to help prevent   a reaction. This medicine may be given to you by mouth (orally) or through an IV. Set aside time for the blood transfusion. This  procedure generally takes 1-4 hours to complete. What happens during the procedure?  An IV will be inserted into one of your veins. The bag of donated blood will be attached to your IV. The blood will then enter through your vein. Your temperature, blood pressure, and pulse will be monitored regularly during the transfusion. This monitoring is done to detect early signs of a transfusion reaction. Tell your nurse right away if you have any of these symptoms during the transfusion: Shortness of breath or trouble breathing. Chest or back pain. Fever or chills. Hives or itching. If you have any signs or symptoms of a reaction, your transfusion will be stopped and you may be given medicine. When the transfusion is complete, your IV will be removed. Pressure may be applied to the IV site for a few minutes. A bandage (dressing)will be applied. The procedure may vary among health care providers and hospitals. What happens after the procedure? Your temperature, blood pressure, pulse, breathing rate, and blood oxygen level will be monitored until you leave the hospital or clinic. Your blood may be tested to see how you are responding to the transfusion. You may be warmed with fluids or blankets to maintain a normal body temperature. If you receive your blood transfusion in an outpatient setting, you will be told whom to contact to report any reactions. Where to find more information For more information on blood transfusions, visit the [employer]: redcross.org Summary A blood transfusion is a procedure in which you receive blood or a type of blood cell (blood component) through an IV. The blood you receive may come from a donor or be donated by yourself (autologous blood donation) before a planned surgery. The blood given in a transfusion is made up of different blood components. You may receive red blood cells, platelets, plasma, or white blood cells depending on the condition treated. Your  temperature, blood pressure, and pulse will be monitored before, during, and after the transfusion. After the transfusion, your blood may be tested to see how your body has responded. This information is not intended to replace advice given to you by your health care provider. Make sure you discuss any questions you have with your health care provider. Document Revised: 05/15/2019 Document Reviewed: 01/02/2019 Elsevier Patient Education  2023 Else[employer] Inc.  

## 2021-11-09 NOTE — Progress Notes (Signed)
Patient previously seen by this office but was unreachable to schedule follow up despite multiple attempts. She called last week to schedule appointment as she wasn't feeling well. She was found to be anemic and is here today for blood transfusion.  ? ?Patient had already reschedule her missed port placement for 11/14/21. She will also need a PET. ? ?PET scheduled for 11/23/2021. Reviewed with patient and she is aware of appointment date, time and location. She knows she needs to be NPO for 6hrs and to hold her am insulin. Radiology information sheet also given to patient.  ? ?Oncology Nurse Navigator Documentation ? ? ?  11/09/2021  ?  9:15 AM  ?Oncology Nurse Navigator Flowsheets  ?Navigator Follow Up Date: 11/23/2021  ?Navigator Follow Up Reason: Scan Review  ?Navigator Location [facility]  ?Navigator Encounter Type Treatment;Appt/Treatment Plan Review  ?Patient Visit Type MedOnc  ?Treatment Phase Active Tx  ?Barriers/Navigation Needs Coordination of Care;Education;Personal Conflicts  ?Education Other  ?Interventions Coordination of Care;Education;Psycho-Social Support  ?Acuity Level 2-Minimal Needs (1-2 Barriers Identified)  ?Coordination of Care Radiology  ?Education Method Verbal;Written  ?Support Groups/Services Friends and Family  ?Time Spent with Patient 30  ?  ?

## 2021-11-10 LAB — BPAM RBC
Blood Product Expiration Date: 202305092359
Blood Product Expiration Date: 202305092359
ISSUE DATE / TIME: 202304190742
ISSUE DATE / TIME: 202304190742
Unit Type and Rh: 6200
Unit Type and Rh: 6200

## 2021-11-10 LAB — TYPE AND SCREEN
ABO/RH(D): A POS
Antibody Screen: POSITIVE
Donor AG Type: NEGATIVE
Donor AG Type: NEGATIVE
Unit division: 0
Unit division: 0

## 2021-11-11 ENCOUNTER — Other Ambulatory Visit: Payer: Self-pay | Admitting: Student

## 2021-11-11 NOTE — H&P (Incomplete)
? ?Chief Complaint: ?Patient was seen in consultation today for port evaluation with possible exchange  ? ?Referring Physician(s): ?Suttle,Dylan J ? ?Supervising Physician: {Supervising Physician:[id number]} ? ?Patient Status: [place] ? ?History of Present Illness: ?Jaime Gomez is a 56 y.o. female with a medical history significant for DM, HTN, chronic UTI on chronic antibiotic therapy, blood clots with IVC filter and Eliquis, renal insufficiency, anemia and endometrial cancer. She has a port-a-catheter that was placed at an external facility. The oncology team has been unable to aspirate blood and there is concern the tip may be malpositioned.  ? ?Interventional Radiology has been asked to evaluate this patient for a port evaluation with possible exchange.  ? ?Past Medical History:  ?Diagnosis Date  ? 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 failure 08/18/2021  ? Diabetes mellitus without complication ([place])   ? Endometrial cancer, FIGO stage IVB ([place]) 08/18/2021  ? Goals of care, counseling/discussion 08/18/2021  ? History of kidney stones   ? Hypertension   ? Leg DVT (deep venous thromboembolism), chronic, right ([place]) 08/18/2021  ? ? ?Past Surgical History:  ?Procedure Laterality Date  ? ABDOMINAL HYSTERECTOMY    ? CYSTOSCOPY W/ URETERAL STENT PLACEMENT Bilateral 09/01/2021  ? Procedure: CYSTOSCOPY WITH RETROGRADE PYELOGRAM/URETERAL STENT PLACEMENT;  Surgeon: Janith Lima, MD;  Location: [facility];  Service: Urology;  Laterality: Bilateral;  ? HERNIA REPAIR    ? IR CONVERT LEFT NEPHROSTOMY TO NEPHROURETERAL CATH  11/02/2021  ? IR CV LINE INJECTION  08/23/2021  ? IR NEPHROSTOMY EXCHANGE LEFT  11/02/2021  ? IR NEPHROSTOMY PLACEMENT LEFT  09/03/2021  ? ? ?Allergies: ?Taxotere [docetaxel] ? ?Medications: ?Prior to Admission medications   ?Medication Sig Start Date End Date Taking? Authorizing Provider  ?apixaban (ELIQUIS) 2.5 MG TABS tablet Take 2.5 mg by mouth 2 (two) times daily.    [provider]  ?atorvastatin (LIPITOR)  10 MG tablet Take 10 mg by mouth daily at 6 (six) AM. 01/19/21   [provider]  ?carvedilol (COREG) 25 MG tablet Take 25 mg by mouth daily at 6 (six) AM.    [provider]  ?everolimus (AFINITOR) 10 MG tablet Take 10 mg by mouth daily at 6 (six) AM. 08/18/20   [provider]  ?folic acid (FOLVITE) 1 MG tablet Take 1 mg by mouth daily at 6 (six) AM. 10/11/20   [provider]  ?gabapentin (NEURONTIN) 100 MG capsule Take 100 mg by mouth 2 (two) times daily. 08/18/20   [provider]  ?insulin aspart protamine - aspart (NOVOLOG MIX 70/30 FLEXPEN) (70-30) 100 UNIT/ML FlexPen Inject 20 Units into the skin as directed. Per meal 01/19/21   [provider]  ?insulin detemir (LEVEMIR) 100 UNIT/ML injection Inject 20 Units into the skin 2 (two) times daily. 01/19/21   [provider]  ?letrozole [facility]) 2.5 MG tablet Take 2.5 mg by mouth daily at 6 (six) AM. 05/12/21   [provider]  ?magnesium oxide (MAG-OX) 400 (240 Mg) MG tablet Take 1 tablet (400 mg total) by mouth daily. 09/05/21   Pokhrel, Corrie Mckusick, MD  ?mirabegron ER (MYRBETRIQ) 25 MG TB24 tablet Take 1 tablet (25 mg total) by mouth daily for 360 doses. 09/02/21 08/28/22  Janith Lima, MD  ?nitrofurantoin, macrocrystal-monohydrate, (MACROBID) 100 MG capsule Take 1 capsule (100 mg total) by mouth 2 (two) times daily. 11/07/21   [name], MD  ?ondansetron (ZOFRAN) 8 MG tablet Take 8 mg by mouth every 8 (eight) hours as needed. 08/18/20   [provider]  ?oxycodone (OXY-IR) 5 MG capsule Take 1 capsule (5 mg total) by mouth every 4 (four) hours as needed. 11/01/21   [name], MD  ?pantoprazole (PROTONIX) 40 MG tablet Take 40 mg by mouth in the morning. 10/15/20   [provider]  ?polyethylene glycol (MIRALAX / GLYCOLAX) 17 g packet Take 17 g by mouth daily as needed for mild constipation. 09/05/21   Pokhrel, Corrie Mckusick, MD  ?pravastatin (PRAVACHOL) 80 MG tablet Take 80 mg by  mouth daily at 6 (six) AM.    [provider]  ?  ? ?No family history on file. ? ?Social History  ? ?Socioeconomic History  ? Marital status: Single  ?  Spouse name: Not on file  ? Number of children: Not on file  ? Years of education: Not on file  ? Highest education level: Not on file  ?Occupational History  ? Not on file  ?Tobacco Use  ? Smoking status: Never  ? Smokeless tobacco: Never  ?Vaping Use  ? Vaping Use: Never used  ?Substance and Sexual Activity  ? Alcohol use: Not Currently  ? Drug use: Never  ? Sexual activity: Not on file  ?Other Topics Concern  ? Not on file  ?Social History Narrative  ? Not on file  ? ?Social Determinants of Health  ? ?Financial Resource Strain: Not on file  ?Food Insecurity: Not on file  ?Transportation Needs: Not on file  ?Physical Activity: Not on file  ?Stress: Not on file  ?Social Connections: Not on file  ? ? ?Review of Systems: A 12 point ROS discussed and pertinent positives are indicated in the HPI above.  All other systems are negative. ? ?Review of Systems ? ?Vital Signs: ?There were no vitals taken for this visit. ? ?Physical Exam ? ?Imaging: ?IR CONVERT LEFT NEPHROSTOMY TO NEPHROURETERAL CATH ? ?Result Date: 11/02/2021 ?INDICATION: 56 year old female with a history of cervical cancer status post prior radiation therapy currently on oral chemotherapy. She recently underwent placement of a retrograde right ureteral stent and placement of an antegrade left percutaneous nephrostomy tube due to inability to cannulate the left ureteral orifice from the bladder. She presents today for nephrostogram and attempted internalization. EXAM: 1. Attempted ureteral stent placement 2. Nephrostomy tube exchange COMPARISON:  Nephrostomy tube placement 09/03/2021 MEDICATIONS: None ANESTHESIA/SEDATION: None CONTRAST:  15 mL Omnipaque 300-administered into the collecting system(s) FLUOROSCOPY: Radiation Exposure Index (as provided by the fluoroscopic device): 43 mGy Kerma  COMPLICATIONS: None immediate. PROCEDURE: Informed written consent was obtained from the patient after a thorough discussion of the procedural risks, benefits and alternatives. All questions were addressed. Maximal Sterile Barrier Technique was utilized including caps, mask, sterile gowns, sterile gloves, sterile drape, hand hygiene and skin antiseptic. A timeout was performed prior to the initiation of the procedure. Contrast was injected through the existing percutaneous nephrostomy tube. This opacifies the renal pelvis and proximal ureter. The tube was transected and removed over a Bentson wire. An angled 5 French catheter was advanced over the wire and positioned in the distal ureter at the level of the sacroiliac joint. Contrast was then injected by hand. Contrast opacifies the distal ureter which is completely occluded. As the pressure of the contrast injection increases, contrast is seen to extravasated into the Peri ureteral space. Using the angled catheter and a hydrophilic wire, attempts were made to pass a wire through the obstruction without entering the ureteral injury. This was ultimately unsuccessful in the decision was made to proceed with routine  nephrostomy tube exchange. Therefore, the wire was coiled in the renal pelvis. The 5 French catheter was removed. A new Cook 10.2 [place] all-purpose drainage catheter was advanced over the wire and formed. A gentle injection of contrast material was performed and images obtained and stored for the medical record. The locking loop of the newly placed nephrostomy tube is in the upper pole infundibulum. The catheter was secured to the skin with 0 silk suture. IMPRESSION: 1. Complete left ureteral obstruction with evidence of extravasation of injected contrast material into the Peri ureteral space. 2. Unsuccessful gentle attempted crossing of ureteral obstruction. 3. Successful routine 10 French percutaneous nephrostomy tube exchange. PLAN: A second attempt at  crossing the ureteral occlusion could be performed in 4-8 weeks after time for healing. Electronically Signed   By: Jacqulynn Cadet M.D.   On: 11/02/2021 15:01  ? ?IR NEPHROSTOMY EXCHANGE LEFT ? ?Result Date

## 2021-11-13 NOTE — H&P (Signed)
Chief Complaint: Patient was seen in consultation today for tunneled catheter with port removal and replacement at the request of Arlan Organ, MD  Referring Physician(s): Arlan Organ, MD  Supervising Physician: Malachy Moan  Patient Status: [facility] - Out-pt  History of Present Illness: Jaime Gomez is a 56 y.o. female with past medical history significant for anemia secondary to renal failure, chronic UTIs, DM type II, endometrial cancer, HTN, DVT on anticoagulation and IVC filter.  Patient recently relocated from [facility] to [place] in January 2023.  Prior to relocating she was diagnosed with metastatic endometrial cancer and underwent numerous courses of chemotherapy and immunotherapy.  Patient saw Dr. Arlan Organ in February 2023 and underwent PET scan that showed metastasis to the lungs without disease in the abdomen or pelvis. Pt had line injection of currently placed port January 2023 that showed malposition. Dr. Elby Showers offered patient new port at that time. Pt presents today for tunneled catheter with port removal and replacement.  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 failure 08/18/2021   Diabetes mellitus without complication (HCC)    Endometrial cancer, FIGO stage IVB (HCC) 08/18/2021   Goals of care, counseling/discussion 08/18/2021   History of kidney stones    Hypertension    Leg DVT (deep venous thromboembolism), chronic, right (HCC) 08/18/2021    Past Surgical History:  Procedure Laterality Date   ABDOMINAL HYSTERECTOMY     CYSTOSCOPY W/ URETERAL STENT PLACEMENT Bilateral 09/01/2021   Procedure: CYSTOSCOPY WITH RETROGRADE PYELOGRAM/URETERAL STENT PLACEMENT;  Surgeon: Jannifer Hick, MD;  Location: [facility] ORS;  Service: Urology;  Laterality: Bilateral;   HERNIA REPAIR     IR CONVERT LEFT NEPHROSTOMY TO NEPHROURETERAL CATH  11/02/2021   IR CV LINE INJECTION  08/23/2021   IR NEPHROSTOMY EXCHANGE LEFT  11/02/2021   IR NEPHROSTOMY  PLACEMENT LEFT  09/03/2021    Allergies: Taxotere [docetaxel]  Medications: Prior to Admission medications   Medication Sig Start Date End Date Taking? Authorizing Provider  apixaban (ELIQUIS) 2.5 MG TABS tablet Take 2.5 mg by mouth 2 (two) times daily.    [provider]  atorvastatin (LIPITOR) 10 MG tablet Take 10 mg by mouth daily at 6 (six) AM. 01/19/21   [provider]  carvedilol (COREG) 25 MG tablet Take 25 mg by mouth daily at 6 (six) AM.    [provider]  everolimus (AFINITOR) 10 MG tablet Take 10 mg by mouth daily at 6 (six) AM. 08/18/20   [provider]  folic acid (FOLVITE) 1 MG tablet Take 1 mg by mouth daily at 6 (six) AM. 10/11/20   [provider]  gabapentin (NEURONTIN) 100 MG capsule Take 100 mg by mouth 2 (two) times daily. 08/18/20   [provider]  insulin aspart protamine - aspart (NOVOLOG MIX 70/30 FLEXPEN) (70-30) 100 UNIT/ML FlexPen Inject 20 Units into the skin as directed. Per meal 01/19/21   [provider]  insulin detemir (LEVEMIR) 100 UNIT/ML injection Inject 20 Units into the skin 2 (two) times daily. 01/19/21   [provider]  letrozole [facility]) 2.5 MG tablet Take 2.5 mg by mouth daily at 6 (six) AM. 05/12/21   [provider]  magnesium oxide (MAG-OX) 400 (240 Mg) MG tablet Take 1 tablet (400 mg total) by mouth daily. 09/05/21   Pokhrel, Rebekah Chesterfield, MD  mirabegron ER (MYRBETRIQ) 25 MG TB24 tablet Take 1 tablet (25 mg total) by mouth daily for 360 doses. 09/02/21 08/28/22  Cardell Peach,  Lesia Sago, MD  nitrofurantoin, macrocrystal-monohydrate, (MACROBID) 100 MG capsule Take 1 capsule (100 mg total) by mouth 2 (two) times daily. 11/07/21   [name], MD  ondansetron (ZOFRAN) 8 MG tablet Take 8 mg by mouth every 8 (eight) hours as needed. 08/18/20   [provider]  oxycodone (OXY-IR) 5 MG capsule Take 1 capsule (5 mg total) by mouth every 4 (four) hours as needed. 11/01/21   [name], MD  pantoprazole (PROTONIX) 40 MG tablet Take 40 mg by mouth in the morning. 10/15/20   [provider]  polyethylene glycol (MIRALAX / GLYCOLAX) 17 g packet Take 17 g by mouth daily as needed for mild constipation. 09/05/21   Pokhrel, Rebekah Chesterfield, MD  pravastatin (PRAVACHOL) 80 MG tablet Take 80 mg by mouth daily at 6 (six) AM.    [provider]     History reviewed. No pertinent family history.  Social History   Socioeconomic History   Marital status: Single    Spouse name: Not on file   Number of children: Not on file   Years of education: Not on file   Highest education level: Not on file  Occupational History   Not on file  Tobacco Use   Smoking status: Never   Smokeless tobacco: Never  Vaping Use   Vaping Use: Never used  Substance and Sexual Activity   Alcohol use: Not Currently   Drug use: Never   Sexual activity: Not on file  Other Topics Concern   Not on file  Social History Narrative   Not on file   Social Determinants of Health   Financial Resource Strain: Not on file  Food Insecurity: Not on file  Transportation Needs: Not on file  Physical Activity: Not on file  Stress: Not on file  Social Connections: Not on file     Review of Systems: A 12 point ROS discussed and pertinent positives are indicated in the HPI above.  All other systems are negative.  Review of Systems  Constitutional:  Negative for appetite change, chills and fever.  HENT:  Negative for nosebleeds.   Respiratory:  Negative for cough and shortness of breath.   Cardiovascular:  Positive for leg swelling. Negative for chest pain.  Gastrointestinal:  Negative for abdominal pain, nausea and vomiting.  Genitourinary:  Negative for hematuria.       Pt reports L nephrostomy tube  Neurological:  Negative for dizziness, light-headedness and headaches.   Vital Signs: BP 128/73   Pulse 68   Temp 97.8 F (36.6 C) (Oral)   Resp 18   SpO2 100%   Physical  Exam Constitutional:      General: She is not in acute distress.    Appearance: Normal appearance. She is ill-appearing.  HENT:     Head: Normocephalic and atraumatic.     Mouth/Throat:     Mouth: Mucous membranes are moist.     Pharynx: Oropharynx is clear.  Eyes:     Extraocular Movements: Extraocular movements intact.     Pupils: Pupils are equal, round, and reactive to light.  Cardiovascular:     Rate and Rhythm: Normal rate and regular rhythm.     Pulses: Normal pulses.     Heart sounds: Normal heart sounds.  Pulmonary:     Effort: Pulmonary effort is normal. No respiratory distress.     Breath sounds: Normal breath sounds.  Abdominal:     General: Bowel sounds are normal. There is no distension.  Palpations: Abdomen is soft.     Tenderness: There is no abdominal tenderness. There is no guarding.  Musculoskeletal:     Right lower leg: No edema.     Left lower leg: No edema.  Skin:    General: Skin is warm and dry.  Neurological:     Mental Status: She is alert and oriented to person, place, and time.  Psychiatric:        Mood and Affect: Mood normal.        Behavior: Behavior normal.        Thought Content: Thought content normal.        Judgment: Judgment normal.    Imaging: IR CONVERT LEFT NEPHROSTOMY TO NEPHROURETERAL CATH  Result Date: 11/02/2021 INDICATION: 56 year old female with a history of cervical cancer status post prior radiation therapy currently on oral chemotherapy. She recently underwent placement of a retrograde right ureteral stent and placement of an antegrade left percutaneous nephrostomy tube due to inability to cannulate the left ureteral orifice from the bladder. She presents today for nephrostogram and attempted internalization. EXAM: 1. Attempted ureteral stent placement 2. Nephrostomy tube exchange COMPARISON:  Nephrostomy tube placement 09/03/2021 MEDICATIONS: None ANESTHESIA/SEDATION: None CONTRAST:  15 mL Omnipaque 300-administered into the  collecting system(s) FLUOROSCOPY: Radiation Exposure Index (as provided by the fluoroscopic device): 43 mGy Kerma COMPLICATIONS: None immediate. PROCEDURE: Informed written consent was obtained from the patient after a thorough discussion of the procedural risks, benefits and alternatives. All questions were addressed. Maximal Sterile Barrier Technique was utilized including caps, mask, sterile gowns, sterile gloves, sterile drape, hand hygiene and skin antiseptic. A timeout was performed prior to the initiation of the procedure. Contrast was injected through the existing percutaneous nephrostomy tube. This opacifies the renal pelvis and proximal ureter. The tube was transected and removed over a Bentson wire. An angled 5 French catheter was advanced over the wire and positioned in the distal ureter at the level of the sacroiliac joint. Contrast was then injected by hand. Contrast opacifies the distal ureter which is completely occluded. As the pressure of the contrast injection increases, contrast is seen to extravasated into the Peri ureteral space. Using the angled catheter and a hydrophilic wire, attempts were made to pass a wire through the obstruction without entering the ureteral injury. This was ultimately unsuccessful in the decision was made to proceed with routine nephrostomy tube exchange. Therefore, the wire was coiled in the renal pelvis. The 5 French catheter was removed. A new Cook 10.2 [place] all-purpose drainage catheter was advanced over the wire and formed. A gentle injection of contrast material was performed and images obtained and stored for the medical record. The locking loop of the newly placed nephrostomy tube is in the upper pole infundibulum. The catheter was secured to the skin with 0 silk suture. IMPRESSION: 1. Complete left ureteral obstruction with evidence of extravasation of injected contrast material into the Peri ureteral space. 2. Unsuccessful gentle attempted crossing of  ureteral obstruction. 3. Successful routine 10 French percutaneous nephrostomy tube exchange. PLAN: A second attempt at crossing the ureteral occlusion could be performed in 4-8 weeks after time for healing. Electronically Signed   By: Malachy Moan M.D.   On: 11/02/2021 15:01   IR NEPHROSTOMY EXCHANGE LEFT  Result Date: 11/02/2021 INDICATION: 56 year old female with a history of cervical cancer status post prior radiation therapy currently on oral chemotherapy. She recently underwent placement of a retrograde right ureteral stent and placement of an antegrade left percutaneous nephrostomy tube due  to inability to cannulate the left ureteral orifice from the bladder. She presents today for nephrostogram and attempted internalization. EXAM: 1. Attempted ureteral stent placement 2. Nephrostomy tube exchange COMPARISON:  Nephrostomy tube placement 09/03/2021 MEDICATIONS: None ANESTHESIA/SEDATION: None CONTRAST:  15 mL Omnipaque 300-administered into the collecting system(s) FLUOROSCOPY: Radiation Exposure Index (as provided by the fluoroscopic device): 43 mGy Kerma COMPLICATIONS: None immediate. PROCEDURE: Informed written consent was obtained from the patient after a thorough discussion of the procedural risks, benefits and alternatives. All questions were addressed. Maximal Sterile Barrier Technique was utilized including caps, mask, sterile gowns, sterile gloves, sterile drape, hand hygiene and skin antiseptic. A timeout was performed prior to the initiation of the procedure. Contrast was injected through the existing percutaneous nephrostomy tube. This opacifies the renal pelvis and proximal ureter. The tube was transected and removed over a Bentson wire. An angled 5 French catheter was advanced over the wire and positioned in the distal ureter at the level of the sacroiliac joint. Contrast was then injected by hand. Contrast opacifies the distal ureter which is completely occluded. As the pressure of the  contrast injection increases, contrast is seen to extravasated into the Peri ureteral space. Using the angled catheter and a hydrophilic wire, attempts were made to pass a wire through the obstruction without entering the ureteral injury. This was ultimately unsuccessful in the decision was made to proceed with routine nephrostomy tube exchange. Therefore, the wire was coiled in the renal pelvis. The 5 French catheter was removed. A new Cook 10.2 [place] all-purpose drainage catheter was advanced over the wire and formed. A gentle injection of contrast material was performed and images obtained and stored for the medical record. The locking loop of the newly placed nephrostomy tube is in the upper pole infundibulum. The catheter was secured to the skin with 0 silk suture. IMPRESSION: 1. Complete left ureteral obstruction with evidence of extravasation of injected contrast material into the Peri ureteral space. 2. Unsuccessful gentle attempted crossing of ureteral obstruction. 3. Successful routine 10 French percutaneous nephrostomy tube exchange. PLAN: A second attempt at crossing the ureteral occlusion could be performed in 4-8 weeks after time for healing. Electronically Signed   By: Malachy Moan M.D.   On: 11/02/2021 15:01    Labs:  CBC: Recent Labs    09/03/21 0727 09/04/21 0719 09/05/21 0420 11/07/21 1453  WBC 12.4* 9.3 9.8 5.6  HGB 9.8* 8.5* 7.7* 6.4*  HCT 30.1* 25.5* 23.8* 20.6*  PLT 147* 114* 120* 312    COAGS: Recent Labs    09/03/21 0727  INR 1.3*    BMP: Recent Labs    09/03/21 0727 09/04/21 0719 09/05/21 0420 11/07/21 1453  NA 140 140 141 140  K 4.4 3.8 3.7 3.8  CL 113* 116* 118* 112*  CO2 11* 10* 10* 18*  GLUCOSE 242* 80 66* 138*  BUN 123* 103* 95* 64*  CALCIUM 7.9* 7.2* 7.1* 9.2  CREATININE 7.17* 6.43* 5.69* 3.46*  GFRNONAA 6* 7* 8* 15*    LIVER FUNCTION TESTS: Recent Labs    08/18/21 1046 09/01/21 1044 09/02/21 0443 09/05/21 0420 11/07/21 1453   BILITOT 0.3 0.9  --  0.1* 0.1*  AST 7* 144*  --  76* 11*  ALT <5 27  --  29 7  ALKPHOS 61 84  --  95 65  PROT 7.8 7.9  --  5.9* 7.1  ALBUMIN 4.0 3.0* 2.4* 2.5* 3.0*    TUMOR MARKERS: No results for input(s): AFPTM, CEA, CA199, CHROMGRNA  in the last 8760 hours.  Assessment and Plan: History of anemia secondary to renal failure, chronic UTIs, DM type II, endometrial cancer, HTN, DVT on anticoagulation and IVC filter.  Patient recently relocated from [facility] to [place] in January 2023.  Prior to relocating she was diagnosed with metastatic endometrial cancer and underwent numerous courses of chemotherapy and immunotherapy.  Patient saw Dr. Arlan Organ in February 2023 and underwent PET scan that showed metastasis to the lungs without disease in the abdomen or pelvis. Pt had line injection of currently placed port January 2023 that showed malposition. Dr. Elby Showers offered patient new port at that time. Pt presents today for tunneled catheter with port removal and replacement.  Pt resting on stretcher. She is A&O, calm and pleasant. She is in no distress.  Pt is NPO per order. Last Eliquis was 11/11/21 Pt adds that this will be her fourth port placement. Other ports either had infection or did not work .   Risks and benefits of tunneled catheter with port removal and replacement with moderate sedation was discussed with the patient including, but not limited to bleeding, infection, pneumothorax, or fibrin sheath development and need for additional procedures.  All of the patient's questions were answered, patient is agreeable to proceed. Consent signed and in chart.   Thank you for this interesting consult.  I greatly enjoyed meeting Tampatha Banger and look forward to participating in their care.  A copy of this report was sent to the requesting provider on this date.  Electronically Signed: Shon Hough, NP 11/14/2021, 8:29 AM   I spent a total of 20 minutes in face to face in  clinical consultation, greater than 50% of which was counseling/coordinating care for tunneled catheter with port removal and replacement.

## 2021-11-14 ENCOUNTER — Ambulatory Visit (HOSPITAL_COMMUNITY)
Admission: RE | Admit: 2021-11-14 | Discharge: 2021-11-14 | Disposition: A | Discharge status: Home / Self Care | Payer: BC Managed Care – PPO | Source: Ambulatory Visit | Attending: Interventional Radiology | Admitting: Interventional Radiology

## 2021-11-14 ENCOUNTER — Encounter (HOSPITAL_COMMUNITY): Payer: Self-pay

## 2021-11-14 ENCOUNTER — Other Ambulatory Visit: Payer: Self-pay

## 2021-11-14 ENCOUNTER — Other Ambulatory Visit (HOSPITAL_COMMUNITY): Payer: Self-pay | Admitting: Interventional Radiology

## 2021-11-14 DIAGNOSIS — D631 Anemia in chronic kidney disease: Secondary | ICD-10-CM | POA: Insufficient documentation

## 2021-11-14 DIAGNOSIS — I129 Hypertensive chronic kidney disease with stage 1 through stage 4 chronic kidney disease, or unspecified chronic kidney disease: Secondary | ICD-10-CM | POA: Insufficient documentation

## 2021-11-14 DIAGNOSIS — E1122 Type 2 diabetes mellitus with diabetic chronic kidney disease: Secondary | ICD-10-CM | POA: Diagnosis not present

## 2021-11-14 DIAGNOSIS — Z452 Encounter for adjustment and management of vascular access device: Secondary | ICD-10-CM | POA: Insufficient documentation

## 2021-11-14 DIAGNOSIS — Z95828 Presence of other vascular implants and grafts: Secondary | ICD-10-CM

## 2021-11-14 DIAGNOSIS — C541 Malignant neoplasm of endometrium: Secondary | ICD-10-CM | POA: Insufficient documentation

## 2021-11-14 DIAGNOSIS — C7800 Secondary malignant neoplasm of unspecified lung: Secondary | ICD-10-CM | POA: Diagnosis not present

## 2021-11-14 DIAGNOSIS — N189 Chronic kidney disease, unspecified: Secondary | ICD-10-CM | POA: Diagnosis not present

## 2021-11-14 DIAGNOSIS — Z86718 Personal history of other venous thrombosis and embolism: Secondary | ICD-10-CM | POA: Insufficient documentation

## 2021-11-14 HISTORY — PX: IR REMOVAL TUN ACCESS W/ PORT W/O FL MOD SED: IMG2290

## 2021-11-14 HISTORY — PX: IR IMAGING GUIDED PORT INSERTION: IMG5740

## 2021-11-14 LAB — GLUCOSE, CAPILLARY: Glucose-Capillary: 102 mg/dL — ABNORMAL HIGH (ref 70–99)

## 2021-11-14 MED ORDER — FENTANYL CITRATE (PF) 100 MCG/2ML IJ SOLN
INTRAMUSCULAR | Status: AC | PRN
  Administered 2021-11-14: 50 ug via INTRAVENOUS

## 2021-11-14 MED ORDER — LIDOCAINE-EPINEPHRINE 1 %-1:100000 IJ SOLN
INTRAMUSCULAR | Status: AC | PRN
  Administered 2021-11-14: 10 mL via INTRADERMAL

## 2021-11-14 MED ORDER — HEPARIN SOD (PORK) LOCK FLUSH 100 UNIT/ML IV SOLN
INTRAVENOUS | Status: AC
  Filled 2021-11-14: qty 5

## 2021-11-14 MED ORDER — HEPARIN SOD (PORK) LOCK FLUSH 100 UNIT/ML IV SOLN
INTRAVENOUS | Status: AC | PRN
  Administered 2021-11-14: 500 [IU] via INTRAVENOUS

## 2021-11-14 MED ORDER — FENTANYL CITRATE (PF) 100 MCG/2ML IJ SOLN
INTRAMUSCULAR | Status: AC
  Filled 2021-11-14: qty 2

## 2021-11-14 MED ORDER — SODIUM CHLORIDE 0.9 % IV SOLN: INTRAVENOUS | Status: DC

## 2021-11-14 MED ORDER — LIDOCAINE-EPINEPHRINE 1 %-1:100000 IJ SOLN
INTRAMUSCULAR | Status: AC
  Filled 2021-11-14: qty 1

## 2021-11-14 MED ORDER — MIDAZOLAM HCL 2 MG/2ML IJ SOLN
INTRAMUSCULAR | Status: AC | PRN
  Administered 2021-11-14: 1 mg via INTRAVENOUS

## 2021-11-14 MED ORDER — MIDAZOLAM HCL 2 MG/2ML IJ SOLN
INTRAMUSCULAR | Status: AC
  Filled 2021-11-14: qty 4

## 2021-11-14 MED ORDER — LIDOCAINE-EPINEPHRINE 1 %-1:100000 IJ SOLN
INTRAMUSCULAR | Status: AC | PRN
  Administered 2021-11-14: 5 mL via INTRADERMAL

## 2021-11-14 NOTE — Sedation Documentation (Signed)
Left port a cath removal procedure begun. ?

## 2021-11-14 NOTE — Discharge Instructions (Signed)

## 2021-11-14 NOTE — Sedation Documentation (Signed)
Right port a cath placement complete. Patient being prepped for left port a cath removal ?

## 2021-11-14 NOTE — Procedures (Signed)
Interventional Radiology Procedure Note ? ?Procedure:  ? ?1.) Placement of new ClearView SL PowerPort via right subclavian vein.  Catheter tip in the upper RA and ready for use.  ?2.) Removal of existing non functioning LEFT chest port. ? ?Complications: None ? ?Estimated Blood Loss: None ? ?Recommendations: ?- DC home ?- Routine wound care ? ?Signed, ? ?Criselda Peaches, MD ? ? ?

## 2021-11-23 ENCOUNTER — Ambulatory Visit (HOSPITAL_COMMUNITY)
Admission: RE | Admit: 2021-11-23 | Discharge: 2021-11-23 | Disposition: A | Discharge status: Home / Self Care | Payer: BC Managed Care – PPO | Source: Ambulatory Visit | Attending: Hematology & Oncology | Admitting: Hematology & Oncology

## 2021-11-23 DIAGNOSIS — C541 Malignant neoplasm of endometrium: Secondary | ICD-10-CM | POA: Diagnosis not present

## 2021-11-23 LAB — GLUCOSE, CAPILLARY: Glucose-Capillary: 130 mg/dL — ABNORMAL HIGH (ref 70–99)

## 2021-11-23 MED ORDER — FLUDEOXYGLUCOSE F - 18 (FDG) INJECTION
7.5000 | Freq: Once | INTRAVENOUS | Status: AC | PRN
  Administered 2021-11-23: 7.4 via INTRAVENOUS

## 2021-11-24 ENCOUNTER — Encounter: Payer: Self-pay | Admitting: *Deleted

## 2021-11-24 NOTE — Progress Notes (Signed)
PET shows progression of disease.  ? ?Oncology Nurse Navigator Documentation ? ? ?  11/24/2021  ? 10:00 AM  ?Oncology Nurse Navigator Flowsheets  ?Navigator Follow Up Date: 12/08/2021  ?Navigator Follow Up Reason: Follow-up Appointment  ?Navigator Location [facility]  ?Navigator Encounter Type Scan Review  ?Patient Visit Type MedOnc  ?Treatment Phase Active Tx  ?Barriers/Navigation Needs Coordination of Care;Education;Personal Conflicts  ?Interventions None Required  ?Acuity Level 2-Minimal Needs (1-2 Barriers Identified)  ?Support Groups/Services Friends and Family  ?Time Spent with Patient 15  ?  ?

## 2021-12-06 ENCOUNTER — Other Ambulatory Visit: Payer: Self-pay | Admitting: *Deleted

## 2021-12-06 MED ORDER — OXYCODONE HCL 5 MG PO CAPS: 5.0000 mg | ORAL_CAPSULE | ORAL | 0 refills | Status: DC | PRN

## 2021-12-08 ENCOUNTER — Inpatient Hospital Stay (HOSPITAL_BASED_OUTPATIENT_CLINIC_OR_DEPARTMENT_OTHER): Payer: BC Managed Care – PPO | Admitting: Hematology & Oncology

## 2021-12-08 ENCOUNTER — Telehealth: Payer: Self-pay | Admitting: *Deleted

## 2021-12-08 ENCOUNTER — Other Ambulatory Visit: Payer: Self-pay

## 2021-12-08 ENCOUNTER — Inpatient Hospital Stay: Payer: BC Managed Care – PPO | Attending: Hematology & Oncology

## 2021-12-08 ENCOUNTER — Encounter: Payer: Self-pay | Admitting: Hematology & Oncology

## 2021-12-08 ENCOUNTER — Inpatient Hospital Stay: Payer: BC Managed Care – PPO

## 2021-12-08 VITALS — BP 142/83 | HR 73 | Temp 98.2°F | Resp 18 | Wt 148.0 lb

## 2021-12-08 DIAGNOSIS — M7989 Other specified soft tissue disorders: Secondary | ICD-10-CM | POA: Insufficient documentation

## 2021-12-08 DIAGNOSIS — C541 Malignant neoplasm of endometrium: Secondary | ICD-10-CM | POA: Diagnosis not present

## 2021-12-08 DIAGNOSIS — C7800 Secondary malignant neoplasm of unspecified lung: Secondary | ICD-10-CM | POA: Insufficient documentation

## 2021-12-08 DIAGNOSIS — D631 Anemia in chronic kidney disease: Secondary | ICD-10-CM

## 2021-12-08 DIAGNOSIS — Z7901 Long term (current) use of anticoagulants: Secondary | ICD-10-CM | POA: Diagnosis not present

## 2021-12-08 DIAGNOSIS — N189 Chronic kidney disease, unspecified: Secondary | ICD-10-CM | POA: Diagnosis not present

## 2021-12-08 DIAGNOSIS — Z95828 Presence of other vascular implants and grafts: Secondary | ICD-10-CM

## 2021-12-08 DIAGNOSIS — Z79899 Other long term (current) drug therapy: Secondary | ICD-10-CM | POA: Insufficient documentation

## 2021-12-08 LAB — SAMPLE TO BLOOD BANK

## 2021-12-08 LAB — CBC WITH DIFFERENTIAL (CANCER CENTER ONLY)
Abs Immature Granulocytes: 0.02 10*3/uL (ref 0.00–0.07)
Basophils Absolute: 0 10*3/uL (ref 0.0–0.1)
Basophils Relative: 1 %
Eosinophils Absolute: 0.3 10*3/uL (ref 0.0–0.5)
Eosinophils Relative: 7 %
HCT: 26.8 % — ABNORMAL LOW (ref 36.0–46.0)
Hemoglobin: 8.9 g/dL — ABNORMAL LOW (ref 12.0–15.0)
Immature Granulocytes: 1 %
Lymphocytes Relative: 21 %
Lymphs Abs: 0.8 10*3/uL (ref 0.7–4.0)
MCH: 29.4 pg (ref 26.0–34.0)
MCHC: 33.2 g/dL (ref 30.0–36.0)
MCV: 88.4 fL (ref 80.0–100.0)
Monocytes Absolute: 0.3 10*3/uL (ref 0.1–1.0)
Monocytes Relative: 7 %
Neutro Abs: 2.5 10*3/uL (ref 1.7–7.7)
Neutrophils Relative %: 63 %
Platelet Count: 169 10*3/uL (ref 150–400)
RBC: 3.03 MIL/uL — ABNORMAL LOW (ref 3.87–5.11)
RDW: 13.9 % (ref 11.5–15.5)
WBC Count: 3.9 10*3/uL — ABNORMAL LOW (ref 4.0–10.5)
nRBC: 0 % (ref 0.0–0.2)

## 2021-12-08 LAB — CMP (CANCER CENTER ONLY)
ALT: 5 U/L (ref 0–44)
AST: 8 U/L — ABNORMAL LOW (ref 15–41)
Albumin: 3.7 g/dL (ref 3.5–5.0)
Alkaline Phosphatase: 78 U/L (ref 38–126)
Anion gap: 11 (ref 5–15)
BUN: 55 mg/dL — ABNORMAL HIGH (ref 6–20)
CO2: 17 mmol/L — ABNORMAL LOW (ref 22–32)
Calcium: 9.3 mg/dL (ref 8.9–10.3)
Chloride: 105 mmol/L (ref 98–111)
Creatinine: 3.12 mg/dL (ref 0.44–1.00)
GFR, Estimated: 17 mL/min — ABNORMAL LOW (ref >60)
Glucose, Bld: 318 mg/dL — ABNORMAL HIGH (ref 70–99)
Potassium: 4 mmol/L (ref 3.5–5.1)
Sodium: 133 mmol/L — ABNORMAL LOW (ref 135–145)
Total Bilirubin: 0.3 mg/dL (ref 0.3–1.2)
Total Protein: 7.2 g/dL (ref 6.5–8.1)

## 2021-12-08 LAB — LACTATE DEHYDROGENASE: LDH: 152 U/L (ref 98–192)

## 2021-12-08 MED ORDER — HEPARIN SOD (PORK) LOCK FLUSH 100 UNIT/ML IV SOLN
500.0000 [IU] | Freq: Once | INTRAVENOUS | Status: AC
  Administered 2021-12-08: 500 [IU] via INTRAVENOUS

## 2021-12-08 MED ORDER — APIXABAN 2.5 MG PO TABS: 2.5000 mg | ORAL_TABLET | Freq: Two times a day (BID) | ORAL | 12 refills | Status: DC

## 2021-12-08 MED ORDER — SODIUM CHLORIDE 0.9% FLUSH
10.0000 mL | Freq: Once | INTRAVENOUS | Status: AC
  Administered 2021-12-08: 10 mL via INTRAVENOUS

## 2021-12-08 NOTE — Progress Notes (Signed)
Hematology and Oncology Follow Up Visit  Wally Behan [id number] 05-03-1966 56 y.o. 12/08/2021   Principle Diagnosis:  Metastatic adenocarcinoma of the endometrium Chronic renal failure Recurrent thromboembolic disease  Current Therapy:   Afinitor/letrozole Blood transfusion as needed Eliquis 2.5 mg p.o. twice daily     Interim History:  Ms. Viereck is back for follow-up.  Unfortunately, she seems to be progressing.  We did get a PET scan on her.  This was done on 11/23/2021.  This did show multiple new pulmonary metastasis.  She also has a new area of activity in the lower sacrum.  This measures 4.8 x 2.2 cm.  She has pain in this area.  I think this would be worthwhile to see about radiation therapy.  I did speak with Dr. Sondra Come of Radiation Oncology.  He will try to get records of past radiation that she had an [place].  She she looks quite good.  She has the nephrostomy tube.  This is in the left side.  This seems to be draining pretty well.  Her creatinine is actually the best that it has been for a while.  Her last CA-125 back in April was actually little better at 143.  She is on Afinitor/letrozole.  She seems to be tolerating this okay.  She is eating okay.  She is having no problems with bleeding.  She is on Eliquis because of thromboembolic disease.  There is been no fever.  She has had no cough or shortness of breath.  She has had no chest pain.  She has little bit of leg swelling but this is chronic.  She does have little more in the way of pain, mostly from this sacral area.  She says she is taking the oxycodone 3 or 4 times a day.  Overall, I would say performance status is probably ECOG 1.   Medications:  Current Outpatient Medications:    apixaban (ELIQUIS) 2.5 MG TABS tablet, Take 2.5 mg by mouth 2 (two) times daily., Disp: , Rfl:    atorvastatin (LIPITOR) 10 MG tablet, Take 10 mg by mouth daily at 6 (six) AM., Disp: , Rfl:    carvedilol (COREG) 25 MG  tablet, Take 25 mg by mouth daily at 6 (six) AM., Disp: , Rfl:    everolimus (AFINITOR) 10 MG tablet, Take 10 mg by mouth daily at 6 (six) AM., Disp: , Rfl:    folic acid (FOLVITE) 1 MG tablet, Take 1 mg by mouth daily at 6 (six) AM., Disp: , Rfl:    gabapentin (NEURONTIN) 100 MG capsule, Take 100 mg by mouth 2 (two) times daily., Disp: , Rfl:    insulin aspart protamine - aspart (NOVOLOG MIX 70/30 FLEXPEN) (70-30) 100 UNIT/ML FlexPen, Inject 20 Units into the skin as directed. Per meal, Disp: , Rfl:    insulin detemir (LEVEMIR) 100 UNIT/ML injection, Inject 20 Units into the skin 2 (two) times daily., Disp: , Rfl:    letrozole (FEMARA) 2.5 MG tablet, Take 2.5 mg by mouth daily at 6 (six) AM., Disp: , Rfl:    magnesium oxide (MAG-OX) 400 (240 Mg) MG tablet, Take 1 tablet (400 mg total) by mouth daily., Disp: 10 tablet, Rfl:    mirabegron ER (MYRBETRIQ) 25 MG TB24 tablet, Take 1 tablet (25 mg total) by mouth daily for 360 doses., Disp: 30 tablet, Rfl: 11   nitrofurantoin, macrocrystal-monohydrate, (MACROBID) 100 MG capsule, Take 1 capsule (100 mg total) by mouth 2 (two) times daily., Disp: 180 capsule, Rfl: 6  ondansetron (ZOFRAN) 8 MG tablet, Take 8 mg by mouth every 8 (eight) hours as needed., Disp: , Rfl:    oxycodone (OXY-IR) 5 MG capsule, Take 1 capsule (5 mg total) by mouth every 4 (four) hours as needed., Disp: 120 capsule, Rfl: 0   pantoprazole (PROTONIX) 40 MG tablet, Take 40 mg by mouth in the morning., Disp: , Rfl:    polyethylene glycol (MIRALAX / GLYCOLAX) 17 g packet, Take 17 g by mouth daily as needed for mild constipation., Disp: 14 each, Rfl: 0   pravastatin (PRAVACHOL) 80 MG tablet, Take 80 mg by mouth daily at 6 (six) AM., Disp: , Rfl:    prochlorperazine (COMPAZINE) 10 MG tablet, Take 10 mg by mouth every 6 (six) hours., Disp: , Rfl:   Allergies:  Allergies  Allergen Reactions   Taxotere [Docetaxel]     Throat swoll    Past Medical History, Surgical history, Social  history, and Family History were reviewed and updated.  Review of Systems: Review of Systems  Constitutional:  Positive for fatigue.  HENT:  Negative.    Eyes: Negative.   Respiratory: Negative.    Cardiovascular:  Positive for leg swelling.  Gastrointestinal:  Positive for abdominal pain.  Genitourinary:  Positive for pelvic pain.   Musculoskeletal: Negative.   Skin: Negative.   Neurological: Negative.   Hematological: Negative.   Psychiatric/Behavioral: Negative.     Physical Exam:  weight is 148 lb (67.1 kg). Her oral temperature is 98.2 F (36.8 C). Her blood pressure is 142/83 (abnormal) and her pulse is 73. Her respiration is 18 and oxygen saturation is 100%.   Wt Readings from Last 3 Encounters:  12/08/21 148 lb (67.1 kg)  11/07/21 151 lb (68.5 kg)  09/01/21 141 lb 15.6 oz (64.4 kg)    Physical Exam Vitals reviewed.  HENT:     Head: Normocephalic and atraumatic.  Eyes:     Pupils: Pupils are equal, round, and reactive to light.  Cardiovascular:     Rate and Rhythm: Normal rate and regular rhythm.     Heart sounds: Normal heart sounds.  Pulmonary:     Effort: Pulmonary effort is normal.     Breath sounds: Normal breath sounds.  Abdominal:     General: Bowel sounds are normal.     Palpations: Abdomen is soft.  Musculoskeletal:        General: No tenderness or deformity. Normal range of motion.     Cervical back: Normal range of motion.  Lymphadenopathy:     Cervical: No cervical adenopathy.  Skin:    General: Skin is warm and dry.     Findings: No erythema or rash.  Neurological:     Mental Status: She is alert and oriented to person, place, and time.  Psychiatric:        Behavior: Behavior normal.        Thought Content: Thought content normal.        Judgment: Judgment normal.     Lab Results  Component Value Date   WBC 3.9 (L) 12/08/2021   HGB 8.9 (L) 12/08/2021   HCT 26.8 (L) 12/08/2021   MCV 88.4 12/08/2021   PLT 169 12/08/2021      Chemistry      Component Value Date/Time   NA 133 (L) 12/08/2021 1003   K 4.0 12/08/2021 1003   CL 105 12/08/2021 1003   CO2 17 (L) 12/08/2021 1003   BUN 55 (H) 12/08/2021 1003   CREATININE 3.12 ([place]) 12/08/2021  1003      Component Value Date/Time   CALCIUM 9.3 12/08/2021 1003   ALKPHOS 78 12/08/2021 1003   AST 8 (L) 12/08/2021 1003   ALT 5 12/08/2021 1003   BILITOT 0.3 12/08/2021 1003      Impression and Plan: Ms. Loughridge is a very charming 56 year old white female.  She has metastatic endometrial cancer.  She is on Afinitor/letrozole.  The PET scan shows that she seems to be progressing.  However, she feels good.  Our options for her really are not all that great.  It will be interesting to see what the latest CA 125 is.  Again we are clearly looking at quality of life as our primary goal.  I does want her to be able to have good function.  Again we do not have a lot of options as far as treating her.  I will have to go through some of her records and see what she has been treated with in the past.  Sometimes, retreatment with an old regimen can be effective.  I think we probably will have to get her through the radiation first.  Hopefully she will be able to get radiation to the sacral area.  I would like to see her back in about 3 weeks or so.    [name], MD 5/18/202311:45 AM

## 2021-12-08 NOTE — Patient Instructions (Signed)
[facility]  Discharge Instructions: Thank you for choosing [facility] to provide your oncology and hematology care.   If you have a lab appointment with the [facility], please go directly to the [facility] and check in at the registration area.  Wear comfortable clothing and clothing appropriate for easy access to any Portacath or PICC line.   We strive to give you quality time with your provider. You may need to reschedule your appointment if you arrive late (15 or more minutes).  Arriving late affects you and other patients whose appointments are after yours.  Also, if you miss three or more appointments without notifying the office, you may be dismissed from the clinic at the provider's discretion.      For prescription refill requests, have your pharmacy contact our office and allow 72 hours for refills to be completed.    Today you received the following chemotherapy and/or immunotherapy agents port-a-cath flush   To help prevent nausea and vomiting after your treatment, we encourage you to take your nausea medication as directed.  BELOW ARE SYMPTOMS THAT SHOULD BE REPORTED IMMEDIATELY: *FEVER GREATER THAN 100.4 F (38 C) OR HIGHER *CHILLS OR SWEATING *NAUSEA AND VOMITING THAT IS NOT CONTROLLED WITH YOUR NAUSEA MEDICATION *UNUSUAL SHORTNESS OF BREATH *UNUSUAL BRUISING OR BLEEDING *URINARY PROBLEMS (pain or burning when urinating, or frequent urination) *BOWEL PROBLEMS (unusual diarrhea, constipation, pain near the anus) TENDERNESS IN MOUTH AND THROAT WITH OR WITHOUT PRESENCE OF ULCERS (sore throat, sores in mouth, or a toothache) UNUSUAL RASH, SWELLING OR PAIN  UNUSUAL VAGINAL DISCHARGE OR ITCHING   Items with * indicate a potential emergency and should be followed up as soon as possible or go to the Emergency Department if any problems should occur.  Please show the CHEMOTHERAPY ALERT CARD or IMMUNOTHERAPY ALERT CARD at check-in to  the Emergency Department and triage nurse. Should you have questions after your visit or need to cancel or reschedule your appointment, please contact [facility]  [phone] and follow the prompts.  Office hours are 8:00 a.m. to 4:30 p.m. Monday - Friday. Please note that voicemails left after 4:00 p.m. may not be returned until the following business day.  We are closed weekends and major holidays. You have access to a nurse at all times for urgent questions. Please call the main number to the clinic [phone] and follow the prompts.  For any non-urgent questions, you may also contact your provider using MyChart. We now offer e-Visits for anyone 35 and older to request care online for non-urgent symptoms. For details visit [email or web address].   Also download the MyChart app! Go to the app store, search "MyChart", open the app, select [facility], and log in with your MyChart username and password.  Due to Covid, a mask is required upon entering the hospital/clinic. If you do not have a mask, one will be given to you upon arrival. For doctor visits, patients may have 1 support person aged 78 or older with them. For treatment visits, patients cannot have anyone with them due to current Covid guidelines and our immunocompromised population.

## 2021-12-08 NOTE — Telephone Encounter (Signed)
Dr. Marin Olp notified of creat-3.1.  No new orders received at this time.

## 2021-12-09 ENCOUNTER — Encounter (HOSPITAL_COMMUNITY): Payer: Self-pay | Admitting: Surgery

## 2021-12-09 LAB — CA 125: Cancer Antigen (CA) 125: 246 U/mL — ABNORMAL HIGH (ref 0.0–38.1)

## 2021-12-09 NOTE — Progress Notes (Signed)
PA for Eliquis 2.5 mg submitted to [facility] on 12/09/21.  PA approved from 11/09/21 through 12/09/22.

## 2021-12-14 ENCOUNTER — Telehealth: Payer: Self-pay | Admitting: Radiation Oncology

## 2021-12-14 NOTE — Telephone Encounter (Signed)
5/24 @ 9:55 am Left voicemail need records before scheduled consult.

## 2021-12-15 ENCOUNTER — Telehealth: Payer: Self-pay | Admitting: Radiation Oncology

## 2021-12-15 NOTE — Telephone Encounter (Signed)
5/25 @ 11:02 am Left voicemail for patient to call our office to be schedule for consult with Dr. Sondra Come.

## 2021-12-26 ENCOUNTER — Telehealth: Payer: Self-pay | Admitting: *Deleted

## 2021-12-26 NOTE — Telephone Encounter (Signed)
Received documents from [facility] for medical records - sent documents to [facility] through intraoffice mail.

## 2021-12-27 NOTE — Progress Notes (Incomplete)
Radiation Oncology         [phone] ________________________________  Initial Outpatient Consultation  Name: Jaime Gomez MRN: [id number]  Date: 12/28/2021  DOB: July 19, 1966  QI:[facility], [name], FNP  [name], MD   REFERRING PHYSICIAN: [name], MD  DIAGNOSIS: There were no encounter diagnoses.  Metastatic endometrial cancer: initially diagnosed in 2012 with 2 synchronous primaries of stage IC adenocarcinoma of the ovary and stage IIIC2 moderately differentiated endometrioid adenocarcinoma of the uterus   - Recent PET showed pulmonary metastases and a new tumor involving the lower sacrum    Cancer Staging  Endometrial cancer, FIGO stage IVB ([place]) Staging form: Corpus Uteri - Carcinoma and Carcinosarcoma, AJCC 8th Edition - Clinical stage from 08/18/2021: FIGO Stage IVB (rcT3a, cNX, pM1) - Signed by [name], MD on 08/18/2021   HISTORY OF PRESENT ILLNESS::Jaime Gomez is a 56 y.o. female who is accompanied by ***. she is seen as a courtesy of Dr. Marin Olp for an opinion concerning radiation therapy as part of management for a new painful sacral tumor . Per record review, the patient was initially diagnosed with ovarian/uterine cancer back in 2012 while living in [place]; s/p total hysterectomy with lymph node dissection, radiation to the uterus and para-aortic region, and taxol (switched to Doxil/carboplatinum). Per Dr. Marin Olp, the patient had a rather immediate recurrence later in 2012 and she was treated with gemcitabine/carboplatinum x 4 years. She moved to [facility] in 2013 where she received the remainder of her treatments. She was then placed on Avastin in June of 2018. She underwent further chemotherapy with almita and [place] but these were discontinued following 2 cycles due to toxicities. In August 2019, she was treated with Keytruda x 7 cycles and then was found to have progressive disease.  She then was treated with topotecan starting in December 2019 x 5  cycles, followed by [facility] until December 2020 was was stopped due to severe hyperglycemia.   She unfortunately had disease progression in April 2021; s/p thoracic radiation therapy, 4 cycles of abraxane completed in October of 2021, and afinitor with letrozole which she is still on.   After moving to [place], the patient established care with Dr. Marin Olp on 08/18/21. 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 The patient also endorsed issues with pain which she manages with oxycodone. Overall, her most pressing issue is renal failure. In terms of her cancer, the patient denied any issues with afintor and letrozole and Dr. Marin Olp accordingly ordered repeat imaging to assess her status.   PET ordered by Dr. Marin Olp on 08/24/21 revealed: hypermetabolic nodules in the left lower lobe and right lower lobe consistent with metastatic pulmonary nodules, and smaller nodules within the upper lobes with low metabolic activity but nonetheless suspicious for metastatic disease. Otherwise, PET showed no evidence of local uterine carcinoma recurrence in the pelvis, or metastatic adenopathy in the abdomen or pelvis. Other findings included mild bilateral hydronephrosis.   The patient was admitted from 09/01/21 through 09/05/21 with AKI. Head CT on 09/01/21 showed no acute intracranial abnormalities. CT renal stone study on 09/01/21 showed new left emphysematous pyelitis, and unchanged moderate right and decreased mild left hydroureteronephrosis, as well as unchanged punctate right nephrolithiasis. CT also showed a significantly thick-walled and decompressed bladder, consistent with cystitis. Urology was consulted in ED and recommended  left stent placement given that the source of AKI was thought to be  obstructive. She was also recommend urinalysis and ceftriaxone with transfer to [facility] for cystoscopy and intervention.  Urology was unable to perform left-sided ureteric intervention so IR was consulted for percutaneous nephrostomy.  Patient underwent percutaneous nephrostomy on the left side on 09/03/2021.  For emphysematous pyelitis, the patient received IV Rocephin. Following discharge, her renal function showed improvement.   The patient recently has a restaging PET performed on 11/23/21 which unfortunately showed interval progression of pulmonary metastasis, including recurrent tumor within an area of radiation change in the right upper lobe measuring 1.1 cm with an SUV max of 8.64 (previously 0.7 cm with SUV max of 1.98). Progression of pulmonary metastatic disease is also defined by: an index nodule within the right lower lobe measuring 1.7 cm with an SUV max of 8.33 (previously 1.3 cm with an SUV of 2.37), an adjacent nodule measuring 1.6 cm with an SUV max of 5.23 (previously 1.1 cm with an SUV of 2.65), a left lower lobe lung nodule measuring 2.4 cm with an SUV max 10.18, and an adjacent nodule measuring 2 cm with an SUV max of 9.64.  PET also showed a new tracer avid tumor involving the lower sacrum and presacral soft tissues measuring approximately 4.8 x 2.2 cm with SUV max of 9.9; with differentials including locally recurrent tumor with extension into the sacrum vs osseous metastasis.  During the patient's most recent follow up visit with Dr. Marin Olp on 12/08/21, the patient reported sacral pain, corresponding with new PET findings. Given so, Dr. Marin Olp referred the patient to me for consideration of radiation this area.   PREVIOUS RADIATION THERAPY: Yes:   - Radiation to the uterus and paraaortic area - 4500 cGy in 25 fractions completed on 02/23/2011 (in [place]?)  - Followed by brachytherapy completed on 06/21/2011 in [place]   - Thoracic radiation therapy in 2021 in [place]:  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 failure 08/18/2021   Diabetes mellitus without complication ([place])    Endometrial cancer, FIGO stage IVB ([place]) 08/18/2021   Goals of care, counseling/discussion 08/18/2021   History of kidney stones    Hypertension    Leg DVT (deep venous thromboembolism), chronic, right ([place]) 08/18/2021    PAST SURGICAL HISTORY: Past Surgical History:  Procedure Laterality Date   ABDOMINAL HYSTERECTOMY     CYSTOSCOPY W/ URETERAL STENT PLACEMENT Bilateral 09/01/2021   Procedure: CYSTOSCOPY WITH RETROGRADE PYELOGRAM/URETERAL STENT PLACEMENT;  Surgeon: Janith Lima, MD;  Location: [facility];  Service: Urology;  Laterality: Bilateral;   HERNIA REPAIR     IR CONVERT LEFT NEPHROSTOMY TO NEPHROURETERAL CATH  11/02/2021   IR CV LINE INJECTION  08/23/2021   IR IMAGING GUIDED PORT INSERTION  11/14/2021   IR NEPHROSTOMY EXCHANGE LEFT  11/02/2021   IR NEPHROSTOMY PLACEMENT LEFT  09/03/2021   IR REMOVAL TUN ACCESS W/ PORT W/O FL MOD SED  11/14/2021    FAMILY HISTORY: No family history on file.  SOCIAL HISTORY:  Social History   Tobacco Use   Smoking status: Never   Smokeless tobacco: Never  Vaping Use   Vaping Use: Never used  Substance Use Topics   Alcohol use: Not Currently   Drug use: Never    ALLERGIES:  Allergies  Allergen Reactions   Taxotere [Docetaxel]     Throat swoll    MEDICATIONS:  Current Outpatient Medications  Medication Sig Dispense Refill   apixaban (ELIQUIS) 2.5 MG TABS tablet Take 1 tablet (2.5 mg total) by mouth 2 (two) times daily. 60 tablet 12   atorvastatin (LIPITOR) 10 MG tablet Take 10 mg by mouth daily at 6 (six) AM.     carvedilol (COREG) 25 MG tablet Take 25 mg by mouth daily at 6 (six) AM.     everolimus (AFINITOR) 10 MG tablet Take 10 mg by mouth daily at 6 (six) AM.     folic acid (FOLVITE) 1 MG tablet Take 1 mg by mouth daily at 6 (six) AM.     gabapentin (NEURONTIN) 100 MG  capsule Take 100 mg by mouth 2 (two) times daily.     insulin aspart protamine - aspart (NOVOLOG MIX 70/30 FLEXPEN) (70-30) 100 UNIT/ML FlexPen Inject 20 Units into the skin as directed. Per meal     insulin detemir (LEVEMIR) 100 UNIT/ML injection Inject 20 Units into the skin 2 (two) times daily.     letrozole (FEMARA) 2.5 MG tablet Take 2.5 mg by mouth daily at 6 (six) AM.     magnesium oxide (MAG-OX) 400 (240 Mg) MG tablet Take 1 tablet (400 mg total) by mouth daily. 10 tablet    mirabegron ER (MYRBETRIQ) 25 MG TB24 tablet Take 1 tablet (25 mg total) by mouth daily for 360 doses. 30 tablet 11   nitrofurantoin, macrocrystal-monohydrate, (MACROBID) 100 MG capsule Take 1 capsule (100 mg total) by mouth 2 (two) times daily. 180 capsule 6   ondansetron (ZOFRAN) 8 MG tablet Take 8 mg by mouth every 8 (eight) hours as needed.     oxycodone (OXY-IR) 5 MG capsule Take 1 capsule (5 mg total) by mouth every 4 (four) hours as needed. 120 capsule 0   pantoprazole (PROTONIX) 40 MG tablet Take 40 mg by mouth in the morning.     polyethylene glycol (MIRALAX / GLYCOLAX) 17 g packet Take 17 g by mouth daily as needed for mild constipation. 14 each 0   pravastatin (PRAVACHOL) 80 MG tablet Take 80 mg by mouth daily at 6 (six) AM.     prochlorperazine (COMPAZINE) 10 MG tablet Take 10 mg by mouth every 6 (six) hours.     No current facility-administered medications for this encounter.    REVIEW OF SYSTEMS:  A 10+ POINT REVIEW OF SYSTEMS WAS OBTAINED including neurology, dermatology, psychiatry, cardiac, respiratory, lymph, extremities, GI, GU, musculoskeletal, constitutional, reproductive, HEENT. ***   PHYSICAL EXAM:  vitals were not taken for this visit.   General: Alert and oriented, in no acute distress HEENT: Head is normocephalic. Extraocular movements are intact. Oropharynx is clear. Neck: Neck is supple, no palpable cervical or supraclavicular lymphadenopathy. Heart: Regular in rate and rhythm with no  murmurs, rubs, or gallops. Chest: Clear to auscultation bilaterally, with no rhonchi, wheezes, or rales. Abdomen: Soft, nontender, nondistended, with no rigidity or guarding. Extremities: No cyanosis or edema. Lymphatics: see Neck Exam Skin: No concerning lesions. Musculoskeletal: symmetric strength and muscle tone throughout. Neurologic: Cranial nerves II through XII are grossly intact. No obvious focalities. Speech is fluent. Coordination is intact. Psychiatric: Judgment and insight are intact. Affect is appropriate. ***  ECOG = ***  0 - Asymptomatic (Fully active, able to carry on all predisease activities without restriction)  1 - Symptomatic but completely ambulatory (Restricted in physically strenuous activity but ambulatory and able to carry out work of a light or sedentary nature. For example, light housework, office work)  2 - Symptomatic, <50% in  bed during the day (Ambulatory and capable of all self care but unable to carry out any work activities. Up and about more than 50% of waking hours)  3 - Symptomatic, >50% in bed, but not bedbound (Capable of only limited self-care, confined to bed or chair 50% or more of waking hours)  4 - Bedbound (Completely disabled. Cannot carry on any self-care. Totally confined to bed or chair)  5 - Death   [facility] Pen MM, Creech RH, Tormey DC, et al. [phone]). "Toxicity and response criteria of the [facility]". [place] Oncol. 5 (6): 649-55  LABORATORY DATA:  Lab Results  Component Value Date   WBC 3.9 (L) 12/08/2021   HGB 8.9 (L) 12/08/2021   HCT 26.8 (L) 12/08/2021   MCV 88.4 12/08/2021   PLT 169 12/08/2021   NEUTROABS 2.5 12/08/2021   Lab Results  Component Value Date   NA 133 (L) 12/08/2021   K 4.0 12/08/2021   CL 105 12/08/2021   CO2 17 (L) 12/08/2021   GLUCOSE 318 (H) 12/08/2021   CREATININE 3.12 (HH) 12/08/2021   CALCIUM 9.3 12/08/2021      RADIOGRAPHY: No results found.    IMPRESSION: Metastatic  endometrial cancer: initially diagnosed in 2012 with 2 synchronous primaries of stage IC adenocarcinoma of the ovary and stage IIIC2 moderately differentiated endometrioid adenocarcinoma of the uterus   - Recent PET showed pulmonary metastases and a new tumor involving the lower sacrum   ***  Today, I talked to the patient and family about the findings and work-up thus far.  We discussed the natural history of *** and general treatment, highlighting the role of radiotherapy in the management.  We discussed the available radiation techniques, and focused on the details of logistics and delivery.  We reviewed the anticipated acute and late sequelae associated with radiation in this setting.  The patient was encouraged to ask questions that I answered to the best of my ability. *** A patient consent form was discussed and signed.  We retained a copy for our records.  The patient would like to proceed with radiation and will be scheduled for CT simulation.  PLAN: ***    *** minutes of total time was spent for this patient encounter, including preparation, face-to-face counseling with the patient and coordination of care, physical exam, and documentation of the encounter.   ------------------------------------------------  Blair Promise, PhD, MD  This document serves as a record of services personally performed by Gery Pray, MD. It was created on his behalf by Roney Mans, a trained medical scribe. The creation of this record is based on the scribe's personal observations and the provider's statements to them. This document has been checked and approved by the attending provider.

## 2021-12-27 NOTE — Progress Notes (Incomplete)
Histology and Location of Primary Cancer: Metastatic adenocarcinoma of the endometrium   Sites of Visceral and Bony Metastatic Disease: lower sacrum   Location(s) of Symptomatic Metastases: lower sacrum   Past/Anticipated chemotherapy by medical oncology, if any: Possible treatment after radiation.   Pain on a scale of 0-10 is: {Number; 2-76  not applicable:20727}      If Spine Met(s), symptoms, if any, include: Bowel/Bladder retention or incontinence (please describe): *** Numbness or weakness in extremities (please describe): *** Current Decadron regimen, if applicable: ***   Ambulatory status? Walker? Wheelchair?: {VQI Ambulatory Status:20974}   SAFETY ISSUES: Prior radiation? yes, *** Pacemaker/ICD? {:18581} Possible current pregnancy? no, hysterectomy Is the patient on methotrexate? {:18581}   Current Complaints / other details:  ***

## 2021-12-28 ENCOUNTER — Telehealth: Payer: Self-pay | Admitting: Oncology

## 2021-12-28 ENCOUNTER — Ambulatory Visit: Payer: BC Managed Care – PPO

## 2021-12-28 ENCOUNTER — Ambulatory Visit
Admission: RE | Admit: 2021-12-28 | Discharge: 2021-12-28 | Disposition: A | Discharge status: Home / Self Care | Payer: BC Managed Care – PPO | Source: Ambulatory Visit | Attending: Radiation Oncology | Admitting: Radiation Oncology

## 2021-12-28 NOTE — Telephone Encounter (Signed)
[place] regarding consult appointment today with Dr. Sondra Come. She would like to reschedule.  Inbasket sent to scheduling to call patient with new appointment.

## 2022-01-04 ENCOUNTER — Other Ambulatory Visit (HOSPITAL_COMMUNITY): Payer: Self-pay | Admitting: Urology

## 2022-01-04 DIAGNOSIS — N1330 Unspecified hydronephrosis: Secondary | ICD-10-CM

## 2022-01-04 NOTE — Progress Notes (Signed)
Radiation Oncology         [phone] ________________________________  Initial Outpatient Consultation  Name: Jaime Gomez MRN: [id number]  Date: 01/05/2022  DOB: 1966/05/01  QZ:ESPQ, [name], FNP  [name], MD   REFERRING PHYSICIAN: [name], MD  DIAGNOSIS: The primary encounter diagnosis was Endometrial cancer, FIGO stage IVB ([place]). A diagnosis of Cancer, metastatic to bone [facility]) was also pertinent to this visit.  Metastatic endometrial cancer: initially diagnosed in 2012 with 2 synchronous primaries of stage IC adenocarcinoma of the ovary and stage IIIC2 moderately differentiated endometrioid adenocarcinoma of the uterus   - Recent PET showed pulmonary metastases and a new tumor involving the lower sacrum    Cancer Staging  Endometrial cancer, FIGO stage IVB ([place]) Staging form: Corpus Uteri - Carcinoma and Carcinosarcoma, AJCC 8th Edition - Clinical stage from 08/18/2021: FIGO Stage IVB (rcT3a, cNX, pM1) - Signed by [name], MD on 08/18/2021   HISTORY OF PRESENT ILLNESS::Jaime Gomez is a 56 y.o. female who is seen as a courtesy of Dr. Marin Olp for an opinion concerning radiation therapy as part of management for a new painful sacral tumor. Per record review, the patient was initially diagnosed with ovarian/uterine cancer back in 2012 while living in [place]; s/p total hysterectomy with lymph node dissection, radiation to the uterus and para-aortic region, and taxol (switched to Doxil/carboplatinum). Per Dr. Marin Olp, the patient had a rather immediate recurrence later in 2012 and she was treated with gemcitabine/carboplatinum x 4 years. She moved to [facility] in 2013 where she received the remainder of her treatments. She was then placed on Avastin in June of 2018. She then underwent further chemotherapy with almita and Lynparza but these were discontinued following 2 cycles due to toxicities. In August 2019, she was treated with Keytruda x 7 cycles and then was  found to have progressive disease.  She was then treated with topotecan starting in December 2019 x 5 cycles, followed by [facility] until December 2020 which was stopped due to severe hyperglycemia.   She unfortunately had disease progression in April 2021; s/p thoracic radiation therapy, 4 cycles of abraxane completed in October of 2021, and afinitor with letrozole which she is still on.   After moving to [place], the patient established care with Dr. Marin Olp on 08/18/21. 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 The patient also endorsed issues with pain which she manages with oxycodone. Overall, her most pressing issue is renal failure. In terms of her cancer, the patient denied any issues with afintor and letrozole and Dr. Marin Olp accordingly ordered repeat imaging to assess her status.   PET ordered by Dr. Marin Olp on 08/24/21 revealed: hypermetabolic nodules in the left lower lobe and right lower lobe consistent with metastatic pulmonary nodules, and smaller nodules within the upper lobes with low metabolic activity but nonetheless suspicious for metastatic disease. Otherwise, PET showed no evidence of local uterine carcinoma recurrence in the pelvis, or metastatic adenopathy in the abdomen or pelvis. Other findings included mild bilateral hydronephrosis.   The patient was admitted from 09/01/21 through 09/05/21 with AKI. Head CT on 09/01/21 showed no acute intracranial abnormalities. CT renal stone study on 09/01/21 showed new left emphysematous pyelitis, and unchanged moderate right and decreased mild left hydroureteronephrosis, as well as unchanged punctate right nephrolithiasis. CT also showed a  significantly thick-walled and decompressed bladder, consistent with cystitis. Urology was  consulted in ED and recommended left stent placement given that the source of AKI was thought to be obstructive. She was also recommend urinalysis and ceftriaxone with transfer to [facility] for cystoscopy and intervention.  Urology was unable to perform left-sided ureteric intervention so IR was consulted for percutaneous nephrostomy.  Patient underwent percutaneous nephrostomy on the left side on 09/03/2021.  For emphysematous pyelitis, the patient received IV Rocephin. Following discharge, her renal function showed improvement.   The patient recently has a restaging PET performed on 11/23/21 which unfortunately showed interval progression of pulmonary metastasis, including recurrent tumor within an area of radiation change in the right upper lobe measuring 1.1 cm with an SUV max of 8.64 (previously 0.7 cm with SUV max of 1.98). Progression of pulmonary metastatic disease is also defined by: an index nodule within the right lower lobe measuring 1.7 cm with an SUV max of 8.33 (previously 1.3 cm with an SUV of 2.37), an adjacent nodule measuring 1.6 cm with an SUV max of 5.23 (previously 1.1 cm with an SUV of 2.65), a left lower lobe lung nodule measuring 2.4 cm with an SUV max 10.18, and an adjacent nodule measuring 2 cm with an SUV max of 9.64.  PET also showed a new tracer avid tumor involving the lower sacrum and presacral soft tissues measuring approximately 4.8 x 2.2 cm with SUV max of 9.9; with differentials including locally recurrent tumor with extension into the sacrum vs osseous metastasis.  During the patient's most recent follow up visit with Dr. Marin Olp on 12/08/21, the patient reported sacral pain, corresponding with new PET findings. Given so, Dr. Marin Olp referred the patient to me for consideration of radiation this area.   PREVIOUS RADIATION THERAPY: Yes:   - Radiation to the uterus and paraaortic area - 4500 cGy in 25 fractions completed on 02/23/2011 (in [place]?)  - Followed by  brachytherapy completed on 06/21/2011 in [place]   - Thoracic radiation therapy in 2021 in [place]:  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 failure 08/18/2021   Diabetes mellitus without complication ([place])    Endometrial cancer, FIGO stage IVB ([place]) 08/18/2021   Goals of care, counseling/discussion 08/18/2021   History of kidney stones    Hypertension    Leg DVT (deep venous thromboembolism), chronic, right ([place]) 08/18/2021    PAST SURGICAL HISTORY: Past Surgical History:  Procedure Laterality Date   ABDOMINAL HYSTERECTOMY     CYSTOSCOPY W/ URETERAL STENT PLACEMENT Bilateral 09/01/2021   Procedure: CYSTOSCOPY WITH RETROGRADE PYELOGRAM/URETERAL STENT PLACEMENT;  Surgeon: Janith Lima, MD;  Location: [facility];  Service: Urology;  Laterality: Bilateral;   HERNIA REPAIR     IR CONVERT LEFT NEPHROSTOMY TO NEPHROURETERAL CATH  11/02/2021   IR CV LINE INJECTION  08/23/2021   IR IMAGING GUIDED PORT INSERTION  11/14/2021   IR NEPHROSTOMY EXCHANGE LEFT  11/02/2021   IR NEPHROSTOMY PLACEMENT LEFT  09/03/2021   IR REMOVAL TUN ACCESS W/ PORT W/O FL MOD SED  11/14/2021    FAMILY HISTORY:  Family History  Problem Relation Age of Onset   Cancer Maternal Grandmother     SOCIAL HISTORY:  Social History   Tobacco Use   Smoking status: Never   Smokeless tobacco: Never  Vaping Use   Vaping Use: Never used  Substance Use Topics   Alcohol use: Not Currently   Drug use: Never  ALLERGIES:  Allergies  Allergen Reactions   Taxotere [Docetaxel]     Throat swoll    MEDICATIONS:  Current Outpatient Medications  Medication Sig Dispense Refill   apixaban (ELIQUIS) 2.5 MG TABS tablet Take 1 tablet (2.5 mg total) by mouth 2 (two) times daily. 60 tablet 12   atorvastatin (LIPITOR) 10 MG tablet Take 10 mg by mouth daily at 6 (six) AM.     carvedilol (COREG) 25 MG tablet Take 25 mg by mouth daily at 6 (six) AM.     everolimus (AFINITOR) 10 MG  tablet Take 10 mg by mouth daily at 6 (six) AM.     folic acid (FOLVITE) 1 MG tablet Take 1 mg by mouth daily at 6 (six) AM.     gabapentin (NEURONTIN) 100 MG capsule Take 100 mg by mouth 2 (two) times daily.     insulin aspart protamine - aspart (NOVOLOG MIX 70/30 FLEXPEN) (70-30) 100 UNIT/ML FlexPen Inject 20 Units into the skin as directed. Per meal     insulin detemir (LEVEMIR) 100 UNIT/ML injection Inject 20 Units into the skin 2 (two) times daily.     letrozole (FEMARA) 2.5 MG tablet Take 2.5 mg by mouth daily at 6 (six) AM.     mirabegron ER (MYRBETRIQ) 25 MG TB24 tablet Take 1 tablet (25 mg total) by mouth daily for 360 doses. 30 tablet 11   nitrofurantoin, macrocrystal-monohydrate, (MACROBID) 100 MG capsule Take 1 capsule (100 mg total) by mouth 2 (two) times daily. 180 capsule 6   ondansetron (ZOFRAN) 8 MG tablet Take 8 mg by mouth every 8 (eight) hours as needed.     oxycodone (OXY-IR) 5 MG capsule Take 1 capsule (5 mg total) by mouth every 4 (four) hours as needed. 120 capsule 0   pantoprazole (PROTONIX) 40 MG tablet Take 40 mg by mouth in the morning.     polyethylene glycol (MIRALAX / GLYCOLAX) 17 g packet Take 17 g by mouth daily as needed for mild constipation. 14 each 0   pravastatin (PRAVACHOL) 80 MG tablet Take 80 mg by mouth daily at 6 (six) AM.     prochlorperazine (COMPAZINE) 10 MG tablet Take 10 mg by mouth every 6 (six) hours.     magnesium oxide (MAG-OX) 400 (240 Mg) MG tablet Take 1 tablet (400 mg total) by mouth daily. (Patient not taking: Reported on 01/05/2022) 10 tablet    No current facility-administered medications for this encounter.    REVIEW OF SYSTEMS:  A 10+ POINT REVIEW OF SYSTEMS WAS OBTAINED including neurology, dermatology, psychiatry, cardiac, respiratory, lymph, extremities, GI, GU, musculoskeletal, constitutional, reproductive, HEENT.  She denies any difficulties with her breathing at this time.  She denies any significant pain within the chest area  significant cough or hemoptysis.  She does complain of pain in the upper buttocks area/sacral region.  Percocet is helping with this issue.  She is unable to lie on her back and sleep at night due to pain.  She must lay on 1 side to sleep.  sHe denies any vaginal bleeding.  She reports tolerating her courses of radiation therapy well except for significant fatigue.   PHYSICAL EXAM:  height is '5\' 5"'$  (1.651 m) and weight is 149 lb 9.6 oz (67.9 kg). Her oral temperature is 98.2 F (36.8 C). Her blood pressure is 139/79 and her pulse is 80. Her respiration is 18 and oxygen saturation is 100%.   General: Alert and oriented, in no acute distress HEENT: Head is normocephalic.  Extraocular movements are intact.  Neck: Neck is supple, no palpable cervical or supraclavicular lymphadenopathy. Heart: Regular in rate and rhythm with no murmurs, rubs, or gallops. Chest: Clear to auscultation bilaterally, with no rhonchi, wheezes, or rales. Abdomen: Soft, nontender, nondistended, with no rigidity or guarding. Extremities: No cyanosis or edema. Lymphatics: see Neck Exam Skin: No concerning lesions. Musculoskeletal: symmetric strength and muscle tone throughout. Neurologic: Cranial nerves II through XII are grossly intact. No obvious focalities. Speech is fluent. Coordination is intact.  Lower motor strength is 5 out of 5 in the proximal and distal muscle groups Psychiatric: Judgment and insight are intact. Affect is appropriate. Percutaneous nephrostomy tube in place along the left side  ECOG = 1  0 - Asymptomatic (Fully active, able to carry on all predisease activities without restriction)  1 - Symptomatic but completely ambulatory (Restricted in physically strenuous activity but ambulatory and able to carry out work of a light or sedentary nature. For example, light housework, office work)  2 - Symptomatic, <50% in bed during the day (Ambulatory and capable of all self care but unable to carry out any work  activities. Up and about more than 50% of waking hours)  3 - Symptomatic, >50% in bed, but not bedbound (Capable of only limited self-care, confined to bed or chair 50% or more of waking hours)  4 - Bedbound (Completely disabled. Cannot carry on any self-care. Totally confined to bed or chair)  5 - Death   Eustace Pen MM, Creech RH, Tormey DC, et al. [phone]). "Toxicity and response criteria of the Practice [facility]". [place] Oncol. 5 (6): 649-55  LABORATORY DATA:  Lab Results  Component Value Date   WBC 3.9 (L) 12/08/2021   HGB 8.9 (L) 12/08/2021   HCT 26.8 (L) 12/08/2021   MCV 88.4 12/08/2021   PLT 169 12/08/2021   NEUTROABS 2.5 12/08/2021   Lab Results  Component Value Date   NA 133 (L) 12/08/2021   K 4.0 12/08/2021   CL 105 12/08/2021   CO2 17 (L) 12/08/2021   GLUCOSE 318 (H) 12/08/2021   BUN 55 (H) 12/08/2021   CREATININE 3.12 (HH) 12/08/2021   CALCIUM 9.3 12/08/2021      RADIOGRAPHY: No results found.    IMPRESSION: Metastatic endometrial cancer: initially diagnosed in 2012 with 2 synchronous primaries of stage IC adenocarcinoma of the ovary and stage IIIC2 moderately differentiated endometrioid adenocarcinoma of the uterus   - Recent PET showed pulmonary metastases and a new tumor involving the lower sacrum   She is symptomatic from her sacral metastasis.  We discussed consideration for palliative radiation therapy directed to this area.  She understands there will be overlap with her previous treatment and increased risk for complications however given the interval since her previous radiation therapy I am hopeful that there will be no significant complications.  Her recurrence is very posterior in the pelvis and does not appear close to small bowel and is posterior to the rectal area in addition.  We have requested records from her pelvic radiation treatments in [place].  Today, I talked to the patient  about the findings and work-up thus far.  We  discussed the natural history of recurrent endometrial cancer and general treatment, highlighting the role of radiotherapy in the management.  We discussed the available radiation techniques, and focused on the details of logistics and delivery.  We reviewed the anticipated acute and late sequelae associated with radiation in this setting.  The patient was encouraged  to ask questions that I answered to the best of my ability.  A patient consent form was discussed and signed.  We retained a copy for our records.  The patient would like to proceed with radiation and will be scheduled for CT simulation.  PLAN: She will be scheduled for CT simulation next week with treatments to begin approximately a week later.  She scheduled for attempts at placing internal drainage and removal of her percutaneous nephrostomy tube next Wednesday.  Dissipated approximately 10 radiation treatments directed at the sacral recurrence.  We will use IMRT to limit dose to normal surrounding critical structures particularly in light of her previous radiation treatments to the pelvis area.   60 minutes of total time was spent for this patient encounter, including preparation, face-to-face counseling with the patient and coordination of care, physical exam, and documentation of the encounter.   ------------------------------------------------  Blair Promise, PhD, MD  This document serves as a record of services personally performed by Gery Pray, MD. It was created on his behalf by Roney Mans, a trained medical scribe. The creation of this record is based on the scribe's personal observations and the provider's statements to them. This document has been checked and approved by the attending provider.

## 2022-01-05 ENCOUNTER — Ambulatory Visit
Admission: RE | Admit: 2022-01-05 | Discharge: 2022-01-05 | Disposition: A | Discharge status: Home / Self Care | Payer: BC Managed Care – PPO | Source: Ambulatory Visit | Attending: Radiation Oncology | Admitting: Radiation Oncology

## 2022-01-05 ENCOUNTER — Other Ambulatory Visit: Payer: Self-pay

## 2022-01-05 ENCOUNTER — Encounter: Payer: Self-pay | Admitting: Radiation Oncology

## 2022-01-05 VITALS — BP 139/79 | HR 80 | Temp 98.2°F | Resp 18 | Ht 65.0 in | Wt 149.6 lb

## 2022-01-05 DIAGNOSIS — C569 Malignant neoplasm of unspecified ovary: Secondary | ICD-10-CM | POA: Diagnosis not present

## 2022-01-05 DIAGNOSIS — C7802 Secondary malignant neoplasm of left lung: Secondary | ICD-10-CM | POA: Diagnosis not present

## 2022-01-05 DIAGNOSIS — C7801 Secondary malignant neoplasm of right lung: Secondary | ICD-10-CM | POA: Diagnosis not present

## 2022-01-05 DIAGNOSIS — C541 Malignant neoplasm of endometrium: Secondary | ICD-10-CM

## 2022-01-05 DIAGNOSIS — Z510 Encounter for antineoplastic radiation therapy: Secondary | ICD-10-CM | POA: Diagnosis not present

## 2022-01-05 DIAGNOSIS — Z79899 Other long term (current) drug therapy: Secondary | ICD-10-CM | POA: Diagnosis not present

## 2022-01-05 DIAGNOSIS — C7951 Secondary malignant neoplasm of bone: Secondary | ICD-10-CM | POA: Insufficient documentation

## 2022-01-05 NOTE — Progress Notes (Signed)
Histology and Location of Primary Cancer: Metastatic adenocarcinoma of the endometrium and adenocarcinoma of the ovary   Sites of Visceral and Bony Metastatic Disease: lower sacrum   Location(s) of Symptomatic Metastases: lower sacrum   Past/Anticipated chemotherapy by medical oncology, if any: Possible treatment after radiation.   Pain on a scale of 0-10 is: 0 today but can go up to 8.  Takes Oxycodone prn.      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 Walker? Wheelchair?: Ambulatory   SAFETY ISSUES: Prior radiation? yes, 4500 cGy in 25 fractions in 2012, vaginal brachytherapy in [place] completed 06/21/2011 Pacemaker/ICD? no Possible current pregnancy? no, hysterectomy Is the patient on methotrexate? no   Current Complaints / other details: States she had radiation internally and externally to her pelvis and also to her left lung.  BP 139/79 (BP Location: Right Arm, Patient Position: Sitting)   Pulse 80   Temp 98.2 F (36.8 C) (Oral)   Resp 18   Ht _0  (1.651 m)   Wt 149 lb 9.6 oz (67.9 kg)   SpO2 100%   BMI 24.89 kg/m

## 2022-01-09 ENCOUNTER — Inpatient Hospital Stay: Payer: BC Managed Care – PPO | Attending: Hematology & Oncology

## 2022-01-09 ENCOUNTER — Other Ambulatory Visit: Payer: Self-pay | Admitting: Oncology

## 2022-01-09 ENCOUNTER — Encounter: Payer: Self-pay | Admitting: *Deleted

## 2022-01-09 ENCOUNTER — Inpatient Hospital Stay: Payer: BC Managed Care – PPO

## 2022-01-09 ENCOUNTER — Encounter: Payer: Self-pay | Admitting: Hematology & Oncology

## 2022-01-09 ENCOUNTER — Inpatient Hospital Stay (HOSPITAL_BASED_OUTPATIENT_CLINIC_OR_DEPARTMENT_OTHER): Payer: BC Managed Care – PPO | Admitting: Hematology & Oncology

## 2022-01-09 ENCOUNTER — Telehealth: Payer: Self-pay | Admitting: *Deleted

## 2022-01-09 VITALS — BP 145/82 | HR 71 | Temp 98.1°F | Resp 17 | Wt 147.0 lb

## 2022-01-09 DIAGNOSIS — C541 Malignant neoplasm of endometrium: Secondary | ICD-10-CM | POA: Insufficient documentation

## 2022-01-09 DIAGNOSIS — D631 Anemia in chronic kidney disease: Secondary | ICD-10-CM

## 2022-01-09 DIAGNOSIS — Z79899 Other long term (current) drug therapy: Secondary | ICD-10-CM | POA: Insufficient documentation

## 2022-01-09 DIAGNOSIS — N1831 Chronic kidney disease, stage 3a: Secondary | ICD-10-CM | POA: Diagnosis not present

## 2022-01-09 DIAGNOSIS — R109 Unspecified abdominal pain: Secondary | ICD-10-CM | POA: Diagnosis not present

## 2022-01-09 DIAGNOSIS — R5383 Other fatigue: Secondary | ICD-10-CM | POA: Diagnosis not present

## 2022-01-09 DIAGNOSIS — R102 Pelvic and perineal pain: Secondary | ICD-10-CM | POA: Diagnosis not present

## 2022-01-09 DIAGNOSIS — D649 Anemia, unspecified: Secondary | ICD-10-CM | POA: Diagnosis not present

## 2022-01-09 DIAGNOSIS — Z7901 Long term (current) use of anticoagulants: Secondary | ICD-10-CM | POA: Insufficient documentation

## 2022-01-09 DIAGNOSIS — M7989 Other specified soft tissue disorders: Secondary | ICD-10-CM | POA: Insufficient documentation

## 2022-01-09 LAB — CBC WITH DIFFERENTIAL (CANCER CENTER ONLY)
Abs Immature Granulocytes: 0.01 10*3/uL (ref 0.00–0.07)
Basophils Absolute: 0 10*3/uL (ref 0.0–0.1)
Basophils Relative: 1 %
Eosinophils Absolute: 0.3 10*3/uL (ref 0.0–0.5)
Eosinophils Relative: 6 %
HCT: 24 % — ABNORMAL LOW (ref 36.0–46.0)
Hemoglobin: 7.8 g/dL — ABNORMAL LOW (ref 12.0–15.0)
Immature Granulocytes: 0 %
Lymphocytes Relative: 21 %
Lymphs Abs: 1 10*3/uL (ref 0.7–4.0)
MCH: 28.3 pg (ref 26.0–34.0)
MCHC: 32.5 g/dL (ref 30.0–36.0)
MCV: 87 fL (ref 80.0–100.0)
Monocytes Absolute: 0.3 10*3/uL (ref 0.1–1.0)
Monocytes Relative: 6 %
Neutro Abs: 3.3 10*3/uL (ref 1.7–7.7)
Neutrophils Relative %: 66 %
Platelet Count: 163 10*3/uL (ref 150–400)
RBC: 2.76 MIL/uL — ABNORMAL LOW (ref 3.87–5.11)
RDW: 14.2 % (ref 11.5–15.5)
WBC Count: 4.9 10*3/uL (ref 4.0–10.5)
nRBC: 0 % (ref 0.0–0.2)

## 2022-01-09 LAB — CMP (CANCER CENTER ONLY)
ALT: <5 U/L (ref 0–44)
AST: 7 U/L — ABNORMAL LOW (ref 15–41)
Albumin: 3.7 g/dL (ref 3.5–5.0)
Alkaline Phosphatase: 90 U/L (ref 38–126)
Anion gap: 11 (ref 5–15)
BUN: 48 mg/dL — ABNORMAL HIGH (ref 6–20)
CO2: 21 mmol/L — ABNORMAL LOW (ref 22–32)
Calcium: 9.6 mg/dL (ref 8.9–10.3)
Chloride: 104 mmol/L (ref 98–111)
Creatinine: 3.08 mg/dL (ref 0.44–1.00)
GFR, Estimated: 17 mL/min — ABNORMAL LOW (ref >60)
Glucose, Bld: 167 mg/dL — ABNORMAL HIGH (ref 70–99)
Potassium: 3.9 mmol/L (ref 3.5–5.1)
Sodium: 136 mmol/L (ref 135–145)
Total Bilirubin: 0.4 mg/dL (ref 0.3–1.2)
Total Protein: 7.5 g/dL (ref 6.5–8.1)

## 2022-01-09 LAB — IRON AND IRON BINDING CAPACITY (CC-WL,HP ONLY)
Iron: 40 ug/dL (ref 28–170)
Saturation Ratios: 20 % (ref 10.4–31.8)
TIBC: 199 ug/dL — ABNORMAL LOW (ref 250–450)
UIBC: 159 ug/dL (ref 148–442)

## 2022-01-09 LAB — SAMPLE TO BLOOD BANK

## 2022-01-09 LAB — LACTATE DEHYDROGENASE: LDH: 171 U/L (ref 98–192)

## 2022-01-09 LAB — FERRITIN: Ferritin: 2240 ng/mL — ABNORMAL HIGH (ref 11–307)

## 2022-01-09 MED ORDER — OXYCODONE HCL 5 MG PO CAPS: 5.0000 mg | ORAL_CAPSULE | ORAL | 0 refills | Status: DC | PRN

## 2022-01-09 NOTE — Telephone Encounter (Signed)
Critical Serum Creatinine 3.08 reported by [name] in Lab.  Dr Marin Olp aware.  Seeing patient in exam room.  No orders received

## 2022-01-09 NOTE — Progress Notes (Signed)
Hematology and Oncology Follow Up Visit  Jaime Gomez [id number] 03-02-1966 56 y.o. 01/09/2022   Principle Diagnosis:  Metastatic adenocarcinoma of the endometrium Chronic renal failure Recurrent thromboembolic disease  Current Therapy:   Afinitor/letrozole Blood transfusion as needed Eliquis 2.5 mg p.o. twice daily     Interim History:  Jaime Gomez is back for follow-up.  She is still not yet started radiation therapy.  Hopefully she will start this week.  This is for some areas in the sacrum.  She is still having some pain.  The pain seems to be fairly well controlled with the oxycodone.  I do think that it be worthwhile trying to get a liquid biopsy from her.  Again, we really need to see if there are any actionable mutations that we might be able to target.  I do not see that we can get enough tissue from a regular biopsy.  I know she has a lung lesions.  Again, I am not sure what we can obtain tissue wise that would be appropriate.  I talked to her about this.  She is in agreement to having the liquid biopsy done.  Her last CA-125 was up to 246.  I think she still is continuing on the Afinitor and letrozole.  She has had no problems with respect to the nephrostomy tube.  She says that urology is going to try to do a stent on her this week.  Her appetite is doing okay.  She is trying to work outside.  She is trying to stay active.  She has had no leg swelling.  There is no rashes.  She has had no cough or shortness of breath.  She was has anemia.  I do not think that we have to transfuse her right now.  Overall, I would have said that her performance status is probably ECOG 1.  Medications:  Current Outpatient Medications:    apixaban (ELIQUIS) 2.5 MG TABS tablet, Take 1 tablet (2.5 mg total) by mouth 2 (two) times daily., Disp: 60 tablet, Rfl: 12   atorvastatin (LIPITOR) 10 MG tablet, Take 10 mg by mouth daily at 6 (six) AM., Disp: , Rfl:    carvedilol (COREG) 25 MG  tablet, Take 25 mg by mouth daily at 6 (six) AM., Disp: , Rfl:    everolimus (AFINITOR) 10 MG tablet, Take 10 mg by mouth daily at 6 (six) AM., Disp: , Rfl:    folic acid (FOLVITE) 1 MG tablet, Take 1 mg by mouth daily at 6 (six) AM., Disp: , Rfl:    gabapentin (NEURONTIN) 100 MG capsule, Take 100 mg by mouth 2 (two) times daily., Disp: , Rfl:    insulin aspart protamine - aspart (NOVOLOG MIX 70/30 FLEXPEN) (70-30) 100 UNIT/ML FlexPen, Inject 20 Units into the skin as directed. Per meal, Disp: , Rfl:    insulin detemir (LEVEMIR) 100 UNIT/ML injection, Inject 20 Units into the skin 2 (two) times daily., Disp: , Rfl:    letrozole (FEMARA) 2.5 MG tablet, Take 2.5 mg by mouth daily at 6 (six) AM., Disp: , Rfl:    magnesium oxide (MAG-OX) 400 (240 Mg) MG tablet, Take 1 tablet (400 mg total) by mouth daily., Disp: 10 tablet, Rfl:    mirabegron ER (MYRBETRIQ) 25 MG TB24 tablet, Take 1 tablet (25 mg total) by mouth daily for 360 doses., Disp: 30 tablet, Rfl: 11   nitrofurantoin, macrocrystal-monohydrate, (MACROBID) 100 MG capsule, Take 1 capsule (100 mg total) by mouth 2 (two) times daily., Disp:  180 capsule, Rfl: 6   ondansetron (ZOFRAN) 8 MG tablet, Take 8 mg by mouth every 8 (eight) hours as needed., Disp: , Rfl:    oxycodone (OXY-IR) 5 MG capsule, Take 1 capsule (5 mg total) by mouth every 4 (four) hours as needed., Disp: 120 capsule, Rfl: 0   pantoprazole (PROTONIX) 40 MG tablet, Take 40 mg by mouth in the morning., Disp: , Rfl:    polyethylene glycol (MIRALAX / GLYCOLAX) 17 g packet, Take 17 g by mouth daily as needed for mild constipation., Disp: 14 each, Rfl: 0   pravastatin (PRAVACHOL) 80 MG tablet, Take 80 mg by mouth daily at 6 (six) AM., Disp: , Rfl:    prochlorperazine (COMPAZINE) 10 MG tablet, Take 10 mg by mouth every 6 (six) hours., Disp: , Rfl:   Allergies:  Allergies  Allergen Reactions   Taxotere [Docetaxel]     Throat swoll    Past Medical History, Surgical history, Social  history, and Family History were reviewed and updated.  Review of Systems: Review of Systems  Constitutional:  Positive for fatigue.  HENT:  Negative.    Eyes: Negative.   Respiratory: Negative.    Cardiovascular:  Positive for leg swelling.  Gastrointestinal:  Positive for abdominal pain.  Genitourinary:  Positive for pelvic pain.   Musculoskeletal: Negative.   Skin: Negative.   Neurological: Negative.   Hematological: Negative.   Psychiatric/Behavioral: Negative.      Physical Exam:  weight is 147 lb (66.7 kg). Her oral temperature is 98.1 F (36.7 C). Her blood pressure is 145/82 (abnormal) and her pulse is 71. Her respiration is 17 and oxygen saturation is 100%.   Wt Readings from Last 3 Encounters:  01/09/22 147 lb (66.7 kg)  01/05/22 149 lb 9.6 oz (67.9 kg)  12/08/21 148 lb (67.1 kg)    Physical Exam Vitals reviewed.  HENT:     Head: Normocephalic and atraumatic.  Eyes:     Pupils: Pupils are equal, round, and reactive to light.  Cardiovascular:     Rate and Rhythm: Normal rate and regular rhythm.     Heart sounds: Normal heart sounds.  Pulmonary:     Effort: Pulmonary effort is normal.     Breath sounds: Normal breath sounds.  Abdominal:     General: Bowel sounds are normal.     Palpations: Abdomen is soft.  Musculoskeletal:        General: No tenderness or deformity. Normal range of motion.     Cervical back: Normal range of motion.  Lymphadenopathy:     Cervical: No cervical adenopathy.  Skin:    General: Skin is warm and dry.     Findings: No erythema or rash.  Neurological:     Mental Status: She is alert and oriented to person, place, and time.  Psychiatric:        Behavior: Behavior normal.        Thought Content: Thought content normal.        Judgment: Judgment normal.     Lab Results  Component Value Date   WBC 4.9 01/09/2022   HGB 7.8 (L) 01/09/2022   HCT 24.0 (L) 01/09/2022   MCV 87.0 01/09/2022   PLT 163 01/09/2022     Chemistry       Component Value Date/Time   NA 133 (L) 12/08/2021 1003   K 4.0 12/08/2021 1003   CL 105 12/08/2021 1003   CO2 17 (L) 12/08/2021 1003   BUN 55 (H) 12/08/2021 1003  CREATININE 3.12 (HH) 12/08/2021 1003      Component Value Date/Time   CALCIUM 9.3 12/08/2021 1003   ALKPHOS 78 12/08/2021 1003   AST 8 (L) 12/08/2021 1003   ALT 5 12/08/2021 1003   BILITOT 0.3 12/08/2021 1003      Impression and Plan: Jaime Gomez is a very charming 56 year old white female.  She has metastatic endometrial cancer.  She is on Afinitor/letrozole.  The PET scan shows that she seems to be progressing.  However, she feels good.  Our options for her really are not all that great.  It will be interesting to see what the latest CA 125 is.  Hopefully, the radiation therapy will help a little bit.  Hopefully, the liquid biopsy will also help with respect to tailoring any further therapy for her.  I would like to try to get her back in a few weeks.  I would like to see what the molecular analysis shows.  I think this will be very helpful.  I am just happy that her quality of life is doing so well right now.  [name], MD 6/19/202310:45 AM

## 2022-01-09 NOTE — Patient Instructions (Signed)

## 2022-01-09 NOTE — Progress Notes (Unsigned)
[place] sent on Patient per Dr Marin Olp orders.

## 2022-01-10 LAB — CA 125: Cancer Antigen (CA) 125: 251 U/mL — ABNORMAL HIGH (ref 0.0–38.1)

## 2022-01-11 ENCOUNTER — Other Ambulatory Visit (HOSPITAL_COMMUNITY): Payer: BC Managed Care – PPO

## 2022-01-16 ENCOUNTER — Other Ambulatory Visit: Payer: Self-pay

## 2022-01-16 ENCOUNTER — Ambulatory Visit
Admission: RE | Admit: 2022-01-16 | Discharge: 2022-01-16 | Disposition: A | Discharge status: Home / Self Care | Payer: BC Managed Care – PPO | Source: Ambulatory Visit | Attending: Radiation Oncology | Admitting: Radiation Oncology

## 2022-01-16 DIAGNOSIS — C541 Malignant neoplasm of endometrium: Secondary | ICD-10-CM

## 2022-01-16 DIAGNOSIS — Z510 Encounter for antineoplastic radiation therapy: Secondary | ICD-10-CM | POA: Diagnosis not present

## 2022-01-19 ENCOUNTER — Other Ambulatory Visit: Payer: Self-pay | Admitting: Student

## 2022-01-19 ENCOUNTER — Encounter: Payer: Self-pay | Admitting: *Deleted

## 2022-01-19 DIAGNOSIS — N1330 Unspecified hydronephrosis: Secondary | ICD-10-CM

## 2022-01-19 DIAGNOSIS — Z510 Encounter for antineoplastic radiation therapy: Secondary | ICD-10-CM | POA: Diagnosis not present

## 2022-01-19 NOTE — Progress Notes (Signed)
Call received from patient stating that the [facility] in [facility] does not have Oxycodone 5 mg tablets available and that she will need an alternative sent in for pain control.  [facility] [place] and they stated that Oxy 5 mg tablets are on back order, but that they do have 76 tablets available for refill.  [facility] to [facility]s [facility] with the 76 available tablets.  Call placed back to patient and message left to notify her of above.

## 2022-01-20 ENCOUNTER — Other Ambulatory Visit (HOSPITAL_COMMUNITY): Payer: Self-pay | Admitting: Urology

## 2022-01-20 ENCOUNTER — Other Ambulatory Visit: Payer: Self-pay

## 2022-01-20 ENCOUNTER — Ambulatory Visit (HOSPITAL_COMMUNITY)
Admission: RE | Admit: 2022-01-20 | Discharge: 2022-01-20 | Disposition: A | Discharge status: Home / Self Care | Payer: BC Managed Care – PPO | Source: Ambulatory Visit | Attending: Urology | Admitting: Urology

## 2022-01-20 ENCOUNTER — Encounter (HOSPITAL_COMMUNITY): Payer: Self-pay

## 2022-01-20 DIAGNOSIS — Z436 Encounter for attention to other artificial openings of urinary tract: Secondary | ICD-10-CM | POA: Insufficient documentation

## 2022-01-20 DIAGNOSIS — Z794 Long term (current) use of insulin: Secondary | ICD-10-CM | POA: Insufficient documentation

## 2022-01-20 DIAGNOSIS — C541 Malignant neoplasm of endometrium: Secondary | ICD-10-CM | POA: Diagnosis not present

## 2022-01-20 DIAGNOSIS — I10 Essential (primary) hypertension: Secondary | ICD-10-CM | POA: Diagnosis not present

## 2022-01-20 DIAGNOSIS — E119 Type 2 diabetes mellitus without complications: Secondary | ICD-10-CM | POA: Diagnosis not present

## 2022-01-20 DIAGNOSIS — N1330 Unspecified hydronephrosis: Secondary | ICD-10-CM

## 2022-01-20 HISTORY — PX: IR NEPHROSTOMY EXCHANGE LEFT: IMG6069

## 2022-01-20 LAB — BASIC METABOLIC PANEL
Anion gap: 11 (ref 5–15)
BUN: 44 mg/dL — ABNORMAL HIGH (ref 6–20)
CO2: 19 mmol/L — ABNORMAL LOW (ref 22–32)
Calcium: 9.2 mg/dL (ref 8.9–10.3)
Chloride: 107 mmol/L (ref 98–111)
Creatinine, Ser: 2.81 mg/dL — ABNORMAL HIGH (ref 0.44–1.00)
GFR, Estimated: 19 mL/min — ABNORMAL LOW (ref >60)
Glucose, Bld: 300 mg/dL — ABNORMAL HIGH (ref 70–99)
Potassium: 3.6 mmol/L (ref 3.5–5.1)
Sodium: 137 mmol/L (ref 135–145)

## 2022-01-20 LAB — CBC WITH DIFFERENTIAL/PLATELET
Abs Immature Granulocytes: 0.29 10*3/uL — ABNORMAL HIGH (ref 0.00–0.07)
Basophils Absolute: 0 10*3/uL (ref 0.0–0.1)
Basophils Relative: 0 %
Eosinophils Absolute: 0.2 10*3/uL (ref 0.0–0.5)
Eosinophils Relative: 4 %
HCT: 20.7 % — ABNORMAL LOW (ref 36.0–46.0)
Hemoglobin: 6.6 g/dL — CL (ref 12.0–15.0)
Immature Granulocytes: 6 %
Lymphocytes Relative: 25 %
Lymphs Abs: 1.2 10*3/uL (ref 0.7–4.0)
MCH: 27.7 pg (ref 26.0–34.0)
MCHC: 31.9 g/dL (ref 30.0–36.0)
MCV: 87 fL (ref 80.0–100.0)
Monocytes Absolute: 0.5 10*3/uL (ref 0.1–1.0)
Monocytes Relative: 10 %
Neutro Abs: 2.8 10*3/uL (ref 1.7–7.7)
Neutrophils Relative %: 55 %
Platelets: 258 10*3/uL (ref 150–400)
RBC: 2.38 MIL/uL — ABNORMAL LOW (ref 3.87–5.11)
RDW: 14.7 % (ref 11.5–15.5)
WBC: 5 10*3/uL (ref 4.0–10.5)
nRBC: 0 % (ref 0.0–0.2)

## 2022-01-20 LAB — GLUCOSE, CAPILLARY: Glucose-Capillary: 303 mg/dL — ABNORMAL HIGH (ref 70–99)

## 2022-01-20 MED ORDER — MIDAZOLAM HCL 2 MG/2ML IJ SOLN
INTRAMUSCULAR | Status: AC | PRN
  Administered 2022-01-20 (×2): 1 mg via INTRAVENOUS

## 2022-01-20 MED ORDER — HEPARIN SOD (PORK) LOCK FLUSH 100 UNIT/ML IV SOLN
500.0000 [IU] | Freq: Once | INTRAVENOUS | Status: AC
  Administered 2022-01-20: 500 [IU] via INTRAVENOUS
  Filled 2022-01-20: qty 5

## 2022-01-20 MED ORDER — LIDOCAINE HCL 1 % IJ SOLN
INTRAMUSCULAR | Status: AC | PRN
  Administered 2022-01-20: 5 mL

## 2022-01-20 MED ORDER — FENTANYL CITRATE (PF) 100 MCG/2ML IJ SOLN
INTRAMUSCULAR | Status: AC
  Filled 2022-01-20: qty 2

## 2022-01-20 MED ORDER — IOHEXOL 300 MG/ML  SOLN
50.0000 mL | Freq: Once | INTRAMUSCULAR | Status: AC | PRN
  Administered 2022-01-20: 15 mL

## 2022-01-20 MED ORDER — SODIUM CHLORIDE 0.9 % IV SOLN: INTRAVENOUS | Status: DC

## 2022-01-20 MED ORDER — MIDAZOLAM HCL 2 MG/2ML IJ SOLN
INTRAMUSCULAR | Status: AC
  Filled 2022-01-20: qty 4

## 2022-01-20 MED ORDER — SODIUM CHLORIDE 0.9 % IV SOLN
2.0000 g | INTRAVENOUS | Status: AC
  Administered 2022-01-20: 2 g via INTRAVENOUS
  Filled 2022-01-20: qty 20

## 2022-01-20 MED ORDER — FENTANYL CITRATE (PF) 100 MCG/2ML IJ SOLN
INTRAMUSCULAR | Status: AC | PRN
  Administered 2022-01-20 (×2): 50 ug via INTRAVENOUS

## 2022-01-20 MED ORDER — LIDOCAINE HCL 1 % IJ SOLN
INTRAMUSCULAR | Status: AC
  Filled 2022-01-20: qty 20

## 2022-01-20 NOTE — Discharge Instructions (Addendum)
For questions /concerns may call Interventional Radiology at [phone]  You may remove your dressing and shower tomorrow afternoon  Moderate Conscious Sedation, Adult, Care After This sheet gives you information about how to care for yourself after your procedure. Your health care provider may also give you more specific instructions. If you have problems or questions, contact your health careprovider. What can I expect after the procedure? After the procedure, it is common to have: Sleepiness for several hours. Impaired judgment for several hours. Difficulty with balance. Vomiting if you eat too soon. Follow these instructions at home: For the time period you were told by your health care provider: Rest. Do not participate in activities where you could fall or become injured. Do not drive or use machinery. Do not drink alcohol. Do not take sleeping pills or medicines that cause drowsiness. Do not make important decisions or sign legal documents. Do not take care of children on your own. Eating and drinking  Follow the diet recommended by your health care provider. Drink enough fluid to keep your urine pale yellow. If you vomit: Drink water, juice, or soup when you can drink without vomiting. Make sure you have little or no nausea before eating solid foods.  General instructions Take over-the-counter and prescription medicines only as told by your health care provider. Have a responsible adult stay with you for the time you are told. It is important to have someone help care for you until you are awake and alert. Do not smoke. Keep all follow-up visits as told by your health care provider. This is important. Contact a health care provider if: You are still sleepy or having trouble with balance after 24 hours. You feel light-headed. You keep feeling nauseous or you keep vomiting. You develop a rash. You have a fever. You have redness or swelling around the IV site. Get help  right away if: You have trouble breathing. You have new-onset confusion at home. Summary After the procedure, it is common to feel sleepy, have impaired judgment, or feel nauseous if you eat too soon. Rest after you get home. Know the things you should not do after the procedure. Follow the diet recommended by your health care provider and drink enough fluid to keep your urine pale yellow. Get help right away if you have trouble breathing or new-onset confusion at home. This information is not intended to replace advice given to you by your health care provider. Make sure you discuss any questions you have with your healthcare provider. Percutaneous Nephrostomy, Care After This sheet gives you information about how to care for yourself after your procedure. Your health care provider may also give you more specific instructions. If you have problems or questions, contact your health care provider. What can I expect after the procedure? After the procedure, it is common to have: Some soreness where the nephrostomy tube was inserted (tube insertion site). Blood-tinged drainage from the nephrostomy tube for the first 24 hours. Follow these instructions at home: Activity  Do not lift anything that is heavier than 10 lb (4.5 kg), or the limit that you are told, until your health care provider says that it is safe. Return to your normal activities as told by your health care provider. Ask your health care provider what activities are safe for you. Avoid activities that may cause the nephrostomy tubing to bend. Do not take baths, swim, or use a hot tub until your health care provider approves. Ask your health care provider if you  can take showers. Cover the nephrostomy tube bandage (dressing) with a watertight covering when you take a shower. If you were given a sedative during the procedure, it can affect you for several hours. Do not drive or operate machinery until your health care provider says  that it is safe. Care of the tube insertion site  Follow instructions from your health care provider about how to take care of your tube insertion site. Make sure you: Wash your hands with soap and water for at least 20 seconds before you change your dressing. If soap and water are not available, use hand sanitizer. Change your dressing as told by your health care provider. Be careful not to pull on the tube while removing the dressing. When you change the dressing, wash the skin around the tube, rinse well, and pat the skin dry. Check the tube insertion area every day for signs of infection. Check for: Redness, swelling, or pain. Fluid or blood. Warmth. Pus or a bad smell. Care of the nephrostomy tube and drainage bag Always keep the tubing, the leg bag, or the bedside drainage bags below the level of the kidney so that your urine drains freely. When connecting your nephrostomy tube to a drainage bag, make sure that there are no kinks in the tubing and that your urine is draining freely. You may want to use an elastic bandage to wrap any exposed tubing that goes from the nephrostomy tube to any of the connecting tubes. At night, you may want to connect your nephrostomy tube or the leg bag to a larger bedside drainage bag. Follow instructions from your health care provider about how to empty or change the drainage bag. Empty the drainage bag when it becomes ? full. Replace the drainage bag and any extension tubing that is connected to your nephrostomy tube every 7 days or as told by your health care provider. Your health care provider will explain how to change the drainage bag and extension tubing. General instructions Take over-the-counter and prescription medicines only as told by your health care provider. Keep all follow-up visits as told by your health care provider. This is important. The nephrostomy tube will need to be changed every 8-12 weeks. Contact a health care provider if: You  have problems with any of the valves or tubing. You have persistent pain or soreness in your back. You have redness, swelling, or pain around your tube insertion site. You have fluid or blood coming from your tube insertion site. Your tube insertion site feels warm to the touch. You have pus or a bad smell coming from your tube insertion site. You have increased urine output or you feel burning when urinating. Get help right away if: You have pain in your abdomen during the first week. You have chest pain or have trouble breathing. You have a new appearance of blood in your urine. You have a fever or chills. You have back pain that is not relieved by your medicine. You have decreased urine output. Your nephrostomy tube comes out. Summary After the procedure, it is common to have some soreness where the nephrostomy tube was inserted (tube insertion site). Follow instructions from your health care provider about how to take care of your tube insertion site, nephrostomy tube, and drainage bag. Keep all follow-up visits for care and for changing the tube. This information is not intended to replace advice given to you by your health care provider. Make sure you discuss any questions you have with your  health care provider.

## 2022-01-20 NOTE — H&P (Signed)
Referring Physician(s): Gay,Matthew R  Supervising Physician: Aletta Edouard  Patient Status:  [facility]  Chief Complaint: Metastatic endometrial cancer   Subjective: Patient familiar to IR service from left nephrostomy on 09/03/2021 , attempted but failed conversion to ureteral stent on 11/02/2021, left chest wall Port-A-Cath removal on 11/14/2021 with placement of new right chest Port-A-Cath .  She has a history of metastatic endometrial cancer with prior radiation therapy and currently on oral chemotherapy.  She had history of bilateral hydronephrosis and right ureteral stent placement in February of this year.  Left ureteral stent could not be placed secondary to inability to cannulate the left ureteral orifice from the bladder.  Prior left nephrostogram in April of this year during attempted stent placement revealed complete left ureteral obstruction with evidence of extravasation of injected contrast into the periureteral space.  Her left nephrostomy was exchanged at that time. She presents today for second attempt at crossing the ureteral occlusion for stent placement.  She currently denies fever, headache, chest pain, dyspnea, cough, abdominal pain, nausea, vomiting or bleeding.  She does have sacral pain and fatigue.  Additional  medical history as below.  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 failure 08/18/2021   Diabetes mellitus without complication (HCC)    Endometrial cancer, FIGO stage IVB ([place]) 08/18/2021   Goals of care, counseling/discussion 08/18/2021   History of kidney stones    Hypertension    Leg DVT (deep venous thromboembolism), chronic, right ([place]) 08/18/2021   Past Surgical History:  Procedure Laterality Date   ABDOMINAL HYSTERECTOMY     CYSTOSCOPY W/ URETERAL STENT PLACEMENT Bilateral 09/01/2021   Procedure: CYSTOSCOPY WITH RETROGRADE PYELOGRAM/URETERAL STENT PLACEMENT;  Surgeon: Janith Lima, MD;  Location: [facility];  Service:  Urology;  Laterality: Bilateral;   HERNIA REPAIR     IR CONVERT LEFT NEPHROSTOMY TO NEPHROURETERAL CATH  11/02/2021   IR CV LINE INJECTION  08/23/2021   IR IMAGING GUIDED PORT INSERTION  11/14/2021   IR NEPHROSTOMY EXCHANGE LEFT  11/02/2021   IR NEPHROSTOMY PLACEMENT LEFT  09/03/2021   IR REMOVAL TUN ACCESS W/ PORT W/O FL MOD SED  11/14/2021      Allergies: Taxotere [docetaxel]  Medications: Prior to Admission medications   Medication Sig Start Date End Date Taking? Authorizing Provider  atorvastatin (LIPITOR) 10 MG tablet Take 10 mg by mouth daily at 6 (six) AM. 01/19/21  Yes [provider]  carvedilol (COREG) 25 MG tablet Take 25 mg by mouth daily at 6 (six) AM.   Yes [provider]  everolimus (AFINITOR) 10 MG tablet Take 10 mg by mouth daily at 6 (six) AM. 08/18/20  Yes [provider]  folic acid (FOLVITE) 1 MG tablet Take 1 mg by mouth daily at 6 (six) AM. 10/11/20  Yes [provider]  gabapentin (NEURONTIN) 100 MG capsule Take 100 mg by mouth 2 (two) times daily. 08/18/20  Yes [provider]  insulin aspart protamine - aspart (NOVOLOG MIX 70/30 FLEXPEN) (70-30) 100 UNIT/ML FlexPen Inject 20 Units into the skin as directed. Per meal 01/19/21  Yes [provider]  insulin detemir (LEVEMIR) 100 UNIT/ML injection Inject 20 Units into the skin 2 (two) times daily. 01/19/21  Yes [provider]  letrozole (FEMARA) 2.5 MG tablet Take 2.5 mg by mouth daily at 6 (six) AM. 05/12/21  Yes [provider]  mirabegron ER (MYRBETRIQ) 25 MG TB24 tablet Take 1 tablet (25 mg total) by mouth  daily for 360 doses. 09/02/21 08/28/22 Yes Janith Lima, MD  nitrofurantoin, macrocrystal-monohydrate, (MACROBID) 100 MG capsule Take 1 capsule (100 mg total) by mouth 2 (two) times daily. 11/07/21  Yes Ennever, Rudell Cobb, MD  ondansetron (ZOFRAN) 8 MG tablet Take 8 mg by mouth every 8 (eight) hours as needed. 08/18/20  Yes [provider]   oxycodone (OXY-IR) 5 MG capsule Take 1 capsule (5 mg total) by mouth every 4 (four) hours as needed. 01/09/22  Yes Ennever, Rudell Cobb, MD  polyethylene glycol (MIRALAX / GLYCOLAX) 17 g packet Take 17 g by mouth daily as needed for mild constipation. 09/05/21  Yes Pokhrel, Laxman, MD  pravastatin (PRAVACHOL) 80 MG tablet Take 80 mg by mouth daily at 6 (six) AM.   Yes [provider]  prochlorperazine (COMPAZINE) 10 MG tablet Take 10 mg by mouth every 6 (six) hours. 08/18/21  Yes [provider]  apixaban (ELIQUIS) 2.5 MG TABS tablet Take 1 tablet (2.5 mg total) by mouth 2 (two) times daily. 12/08/21   [name], MD  magnesium oxide (MAG-OX) 400 (240 Mg) MG tablet Take 1 tablet (400 mg total) by mouth daily. 09/05/21   Pokhrel, Corrie Mckusick, MD  pantoprazole (PROTONIX) 40 MG tablet Take 40 mg by mouth in the morning. 10/15/20   [provider]     Vital Signs: BP (!) 168/96   Pulse 81   Temp 98.5 F (36.9 C) (Oral)   Resp 18   Ht '5\' 5"'$  (1.651 m)   Wt 147 lb (66.7 kg)   SpO2 96%   BMI 24.46 kg/m   Physical Exam awake, alert.  Chest clear to auscultation bilaterally.Clean, intact right chest wall Port-A-Cath.  Heart with regular rate and rhythm.  Abdomen soft, positive bowel sounds, nontender.  Left nephrostomy intact draining yellow urine.  Extremities with full range of motion.  Imaging: No results found.  Labs:  CBC: Recent Labs    11/07/21 1453 12/08/21 1003 01/09/22 1025 01/20/22 1211  WBC 5.6 3.9* 4.9 5.0  HGB 6.4* 8.9* 7.8* 6.6*  HCT 20.6* 26.8* 24.0* 20.7*  PLT 312 169 163 258    COAGS: Recent Labs    09/03/21 0727  INR 1.3*    BMP: Recent Labs    11/07/21 1453 12/08/21 1003 01/09/22 1025 01/20/22 1211  NA 140 133* 136 137  K 3.8 4.0 3.9 3.6  CL 112* 105 104 107  CO2 18* 17* 21* 19*  GLUCOSE 138* 318* 167* 300*  BUN 64* 55* 48* 44*  CALCIUM 9.2 9.3 9.6 9.2  CREATININE 3.46* 3.12* 3.08* 2.81*  GFRNONAA 15* 17* 17* 19*     LIVER FUNCTION TESTS: Recent Labs    09/05/21 0420 11/07/21 1453 12/08/21 1003 01/09/22 1025  BILITOT 0.1* 0.1* 0.3 0.4  AST 76* 11* 8* 7*  ALT '29 7 5 '$ <5  ALKPHOS 95 65 78 90  PROT 5.9* 7.1 7.2 7.5  ALBUMIN 2.5* 3.0* 3.7 3.7    Assessment and Plan: Patient familiar to IR service from left nephrostomy on 09/03/2021 , attempted but failed conversion to ureteral stent on 11/02/2021, left chest wall Port-A-Cath removal on 11/14/2021 with placement of new right chest Port-A-Cath .  She has a history of metastatic endometrial cancer with prior radiation therapy and currently on oral chemotherapy.  She had history of bilateral hydronephrosis and right ureteral stent placement in February of this year.  Left ureteral stent could not be placed secondary to inability to cannulate the left ureteral orifice  from the bladder.  Prior left nephrostogram in April of this year during attempted stent placement revealed complete left ureteral obstruction with evidence of extravasation of injected contrast into the periureteral space.  Her left nephrostomy was exchanged at that time. She presents today for second attempt at crossing the ureteral occlusion for stent placement.  Details/risks of procedure, including but not limited to, internal bleeding, infection, injury to adjacent structures, inability to place stent discussed with patient with her understanding and consent.  Pre procedure lab work today revealed hemoglobin of 6.6, previously 7.8 on 6/19, creat 2.81(3.08); Dr. Antonieta Pert office notified.   Electronically Signed: D. Rowe Robert, PA-C 01/20/2022, 1:38 PM   I spent a total of 20 minutes at the the patient's bedside AND on the patient's hospital floor or unit, greater than 50% of which was counseling/coordinating care for left nephrostogram with attempted ureteral stent placement

## 2022-01-20 NOTE — Progress Notes (Signed)
Called with Critical Lab Value Hgb 6.6- K. Allred PA informed.  Will proceed with procedure per MD.

## 2022-01-26 ENCOUNTER — Inpatient Hospital Stay: Payer: BC Managed Care – PPO

## 2022-01-26 ENCOUNTER — Telehealth: Payer: Self-pay | Admitting: *Deleted

## 2022-01-26 ENCOUNTER — Inpatient Hospital Stay (HOSPITAL_BASED_OUTPATIENT_CLINIC_OR_DEPARTMENT_OTHER): Payer: BC Managed Care – PPO | Admitting: Hematology & Oncology

## 2022-01-26 ENCOUNTER — Other Ambulatory Visit: Payer: Self-pay

## 2022-01-26 ENCOUNTER — Ambulatory Visit
Admission: RE | Admit: 2022-01-26 | Discharge: 2022-01-26 | Disposition: A | Discharge status: Home / Self Care | Payer: BC Managed Care – PPO | Source: Ambulatory Visit | Attending: Radiation Oncology | Admitting: Radiation Oncology

## 2022-01-26 ENCOUNTER — Other Ambulatory Visit: Payer: Self-pay | Admitting: *Deleted

## 2022-01-26 ENCOUNTER — Encounter: Payer: Self-pay | Admitting: Hematology & Oncology

## 2022-01-26 VITALS — BP 139/77 | HR 79 | Temp 97.8°F | Resp 17 | Wt 149.0 lb

## 2022-01-26 DIAGNOSIS — D631 Anemia in chronic kidney disease: Secondary | ICD-10-CM | POA: Diagnosis not present

## 2022-01-26 DIAGNOSIS — N189 Chronic kidney disease, unspecified: Secondary | ICD-10-CM | POA: Insufficient documentation

## 2022-01-26 DIAGNOSIS — Z95828 Presence of other vascular implants and grafts: Secondary | ICD-10-CM | POA: Insufficient documentation

## 2022-01-26 DIAGNOSIS — C541 Malignant neoplasm of endometrium: Secondary | ICD-10-CM

## 2022-01-26 DIAGNOSIS — D649 Anemia, unspecified: Secondary | ICD-10-CM | POA: Insufficient documentation

## 2022-01-26 DIAGNOSIS — Z7901 Long term (current) use of anticoagulants: Secondary | ICD-10-CM | POA: Diagnosis not present

## 2022-01-26 DIAGNOSIS — Z510 Encounter for antineoplastic radiation therapy: Secondary | ICD-10-CM | POA: Insufficient documentation

## 2022-01-26 LAB — CMP (CANCER CENTER ONLY)
ALT: <5 U/L (ref 0–44)
AST: 8 U/L — ABNORMAL LOW (ref 15–41)
Albumin: 3.7 g/dL (ref 3.5–5.0)
Alkaline Phosphatase: 79 U/L (ref 38–126)
Anion gap: 13 (ref 5–15)
BUN: 43 mg/dL — ABNORMAL HIGH (ref 6–20)
CO2: 20 mmol/L — ABNORMAL LOW (ref 22–32)
Calcium: 9.5 mg/dL (ref 8.9–10.3)
Chloride: 105 mmol/L (ref 98–111)
Creatinine: 3.49 mg/dL (ref 0.44–1.00)
GFR, Estimated: 15 mL/min — ABNORMAL LOW (ref >60)
Glucose, Bld: 192 mg/dL — ABNORMAL HIGH (ref 70–99)
Potassium: 3.5 mmol/L (ref 3.5–5.1)
Sodium: 138 mmol/L (ref 135–145)
Total Bilirubin: 0.3 mg/dL (ref 0.3–1.2)
Total Protein: 7.1 g/dL (ref 6.5–8.1)

## 2022-01-26 LAB — RAD ONC ARIA SESSION SUMMARY
Course Elapsed Days: 0
Plan Fractions Treated to Date: 1
Plan Prescribed Dose Per Fraction: 3 Gy
Plan Total Fractions Prescribed: 10
Plan Total Prescribed Dose: 30 Gy
Reference Point Dosage Given to Date: 3 Gy
Reference Point Session Dosage Given: 3 Gy
Session Number: 1

## 2022-01-26 LAB — CBC WITH DIFFERENTIAL (CANCER CENTER ONLY)
Abs Immature Granulocytes: 0.04 10*3/uL (ref 0.00–0.07)
Basophils Absolute: 0 10*3/uL (ref 0.0–0.1)
Basophils Relative: 1 %
Eosinophils Absolute: 0.2 10*3/uL (ref 0.0–0.5)
Eosinophils Relative: 3 %
HCT: 20.2 % — ABNORMAL LOW (ref 36.0–46.0)
Hemoglobin: 6.3 g/dL — CL (ref 12.0–15.0)
Immature Granulocytes: 1 %
Lymphocytes Relative: 22 %
Lymphs Abs: 1.2 10*3/uL (ref 0.7–4.0)
MCH: 27.4 pg (ref 26.0–34.0)
MCHC: 31.2 g/dL (ref 30.0–36.0)
MCV: 87.8 fL (ref 80.0–100.0)
Monocytes Absolute: 0.3 10*3/uL (ref 0.1–1.0)
Monocytes Relative: 5 %
Neutro Abs: 3.6 10*3/uL (ref 1.7–7.7)
Neutrophils Relative %: 68 %
Platelet Count: 247 10*3/uL (ref 150–400)
RBC: 2.3 MIL/uL — ABNORMAL LOW (ref 3.87–5.11)
RDW: 14.8 % (ref 11.5–15.5)
WBC Count: 5.3 10*3/uL (ref 4.0–10.5)
nRBC: 0 % (ref 0.0–0.2)

## 2022-01-26 LAB — RETICULOCYTES
Immature Retic Fract: 15.1 % (ref 2.3–15.9)
RBC.: 2.36 MIL/uL — ABNORMAL LOW (ref 3.87–5.11)
Retic Count, Absolute: 56.9 10*3/uL (ref 19.0–186.0)
Retic Ct Pct: 2.4 % (ref 0.4–3.1)

## 2022-01-26 LAB — SAMPLE TO BLOOD BANK

## 2022-01-26 LAB — FERRITIN: Ferritin: 1916 ng/mL — ABNORMAL HIGH (ref 11–307)

## 2022-01-26 LAB — PREPARE RBC (CROSSMATCH)

## 2022-01-26 NOTE — Telephone Encounter (Signed)
Critical Creatinine of 3.5 and Hgb 6.3 reported by [name] in lab.  Dr Marin Olp notified.  Seeing patient in office.  Orders placed for 2 u of blood tomorrow.

## 2022-01-27 ENCOUNTER — Telehealth: Payer: Self-pay | Admitting: Radiation Oncology

## 2022-01-27 ENCOUNTER — Inpatient Hospital Stay: Payer: BC Managed Care – PPO

## 2022-01-27 ENCOUNTER — Other Ambulatory Visit: Payer: Self-pay | Admitting: *Deleted

## 2022-01-27 ENCOUNTER — Other Ambulatory Visit: Payer: Self-pay

## 2022-01-27 ENCOUNTER — Ambulatory Visit
Admission: RE | Admit: 2022-01-27 | Discharge: 2022-01-27 | Disposition: A | Discharge status: Home / Self Care | Payer: BC Managed Care – PPO | Source: Ambulatory Visit | Attending: Radiation Oncology | Admitting: Radiation Oncology

## 2022-01-27 DIAGNOSIS — D631 Anemia in chronic kidney disease: Secondary | ICD-10-CM

## 2022-01-27 DIAGNOSIS — Z510 Encounter for antineoplastic radiation therapy: Secondary | ICD-10-CM | POA: Diagnosis not present

## 2022-01-27 LAB — RAD ONC ARIA SESSION SUMMARY
Course Elapsed Days: 1
Plan Fractions Treated to Date: 2
Plan Prescribed Dose Per Fraction: 3 Gy
Plan Total Fractions Prescribed: 10
Plan Total Prescribed Dose: 30 Gy
Reference Point Dosage Given to Date: 6 Gy
Reference Point Session Dosage Given: 3 Gy
Session Number: 2

## 2022-01-27 LAB — IRON AND IRON BINDING CAPACITY (CC-WL,HP ONLY)
Iron: 42 ug/dL (ref 28–170)
Saturation Ratios: 21 % (ref 10.4–31.8)
TIBC: 199 ug/dL — ABNORMAL LOW (ref 250–450)
UIBC: 157 ug/dL (ref 148–442)

## 2022-01-27 LAB — CA 125: Cancer Antigen (CA) 125: 270 U/mL — ABNORMAL HIGH (ref 0.0–38.1)

## 2022-01-27 MED ORDER — SODIUM CHLORIDE 0.9% IV SOLUTION
250.0000 mL | Freq: Once | INTRAVENOUS | Status: AC
  Administered 2022-01-27: 250 mL via INTRAVENOUS

## 2022-01-27 MED ORDER — DIPHENHYDRAMINE HCL 25 MG PO CAPS: 25.0000 mg | ORAL_CAPSULE | Freq: Once | ORAL | Status: DC

## 2022-01-27 MED ORDER — HEPARIN SOD (PORK) LOCK FLUSH 100 UNIT/ML IV SOLN
500.0000 [IU] | Freq: Every day | INTRAVENOUS | Status: AC | PRN
  Administered 2022-01-27: 500 [IU]

## 2022-01-27 MED ORDER — SODIUM CHLORIDE 0.9% FLUSH
10.0000 mL | INTRAVENOUS | Status: AC | PRN
  Administered 2022-01-27: 10 mL

## 2022-01-27 MED ORDER — ACETAMINOPHEN 325 MG PO TABS: 650.0000 mg | ORAL_TABLET | Freq: Once | ORAL | Status: DC

## 2022-01-27 NOTE — Telephone Encounter (Signed)
Called patient to get ROI documents signed. No answer, LVM for a return call.

## 2022-01-27 NOTE — Progress Notes (Signed)
Hematology and Oncology Follow Up Visit  Jaime Gomez [id number] Nov 02, 1965 56 y.o. 01/27/2022   Principle Diagnosis:  Metastatic adenocarcinoma of the endometrium Chronic renal failure Recurrent thromboembolic disease  Current Therapy:   Afinitor/letrozole Blood transfusion as needed Eliquis 2.5 mg p.o. twice daily     Interim History:  Jaime Gomez is back for follow-up.  Unfortunately, we still do not have back yet the liquid biopsy results that we got on her a couple weeks or so ago.  It is unfortunate that this is taking a while to return.  She started the radiation therapy to her hip.  This is over on the left side.  This has been quite painful for her.  She is quite tired.  Hemoglobin is only 6.3.  We will have to transfuse her.  We will transfuse her on Friday.  Her appetite is okay.  When she gets quite anemic, she does not feel like eating all that much.  There is been no nausea or vomiting.  I do not think she has had any issues with bleeding.  There is no diarrhea.  She does have the kidney issues.  She has nephrostomy tube in.  I am unsure if Urology has been able to exchange for stent.  Her last CA-125 with holding steady at 251.  She has had no fever.  There is been no change in mild leg swelling..  She is on Eliquis.  I think she is doing well on the Eliquis.  She has had occasional headache.  Overall, I would say performance status is probably ECOG 1.    Medications:  Current Outpatient Medications:    apixaban (ELIQUIS) 2.5 MG TABS tablet, Take 1 tablet (2.5 mg total) by mouth 2 (two) times daily., Disp: 60 tablet, Rfl: 12   atorvastatin (LIPITOR) 10 MG tablet, Take 10 mg by mouth daily at 6 (six) AM., Disp: , Rfl:    carvedilol (COREG) 25 MG tablet, Take 25 mg by mouth daily at 6 (six) AM., Disp: , Rfl:    everolimus (AFINITOR) 10 MG tablet, Take 10 mg by mouth daily at 6 (six) AM., Disp: , Rfl:    folic acid (FOLVITE) 1 MG tablet, Take 1 mg by mouth daily  at 6 (six) AM., Disp: , Rfl:    gabapentin (NEURONTIN) 100 MG capsule, Take 100 mg by mouth 2 (two) times daily., Disp: , Rfl:    insulin aspart protamine - aspart (NOVOLOG MIX 70/30 FLEXPEN) (70-30) 100 UNIT/ML FlexPen, Inject 20 Units into the skin as directed. Per meal, Disp: , Rfl:    insulin detemir (LEVEMIR) 100 UNIT/ML injection, Inject 20 Units into the skin 2 (two) times daily., Disp: , Rfl:    letrozole (FEMARA) 2.5 MG tablet, Take 2.5 mg by mouth daily at 6 (six) AM., Disp: , Rfl:    mirabegron ER (MYRBETRIQ) 25 MG TB24 tablet, Take 1 tablet (25 mg total) by mouth daily for 360 doses., Disp: 30 tablet, Rfl: 11   nitrofurantoin, macrocrystal-monohydrate, (MACROBID) 100 MG capsule, Take 1 capsule (100 mg total) by mouth 2 (two) times daily., Disp: 180 capsule, Rfl: 6   ondansetron (ZOFRAN) 8 MG tablet, Take 8 mg by mouth every 8 (eight) hours as needed., Disp: , Rfl:    oxycodone (OXY-IR) 5 MG capsule, Take 1 capsule (5 mg total) by mouth every 4 (four) hours as needed., Disp: 120 capsule, Rfl: 0   pantoprazole (PROTONIX) 40 MG tablet, Take 40 mg by mouth in the morning., Disp: ,  Rfl:    polyethylene glycol (MIRALAX / GLYCOLAX) 17 g packet, Take 17 g by mouth daily as needed for mild constipation., Disp: 14 each, Rfl: 0   pravastatin (PRAVACHOL) 80 MG tablet, Take 80 mg by mouth daily at 6 (six) AM., Disp: , Rfl:    prochlorperazine (COMPAZINE) 10 MG tablet, Take 10 mg by mouth every 6 (six) hours., Disp: , Rfl:   Allergies:  Allergies  Allergen Reactions   Taxotere [Docetaxel]     Throat swoll    Past Medical History, Surgical history, Social history, and Family History were reviewed and updated.  Review of Systems: Review of Systems  Constitutional:  Positive for fatigue.  HENT:  Negative.    Eyes: Negative.   Respiratory: Negative.    Cardiovascular:  Positive for leg swelling.  Gastrointestinal:  Positive for abdominal pain.  Genitourinary:  Positive for pelvic pain.    Musculoskeletal: Negative.   Skin: Negative.   Neurological: Negative.   Hematological: Negative.   Psychiatric/Behavioral: Negative.      Physical Exam:  weight is 149 lb (67.6 kg). Her oral temperature is 97.8 F (36.6 C). Her blood pressure is 139/77 and her pulse is 79. Her respiration is 17 and oxygen saturation is 100%.   Wt Readings from Last 3 Encounters:  01/26/22 149 lb (67.6 kg)  01/20/22 147 lb (66.7 kg)  01/09/22 147 lb (66.7 kg)    Physical Exam Vitals reviewed.  HENT:     Head: Normocephalic and atraumatic.  Eyes:     Pupils: Pupils are equal, round, and reactive to light.  Cardiovascular:     Rate and Rhythm: Normal rate and regular rhythm.     Heart sounds: Normal heart sounds.  Pulmonary:     Effort: Pulmonary effort is normal.     Breath sounds: Normal breath sounds.  Abdominal:     General: Bowel sounds are normal.     Palpations: Abdomen is soft.  Musculoskeletal:        General: No tenderness or deformity. Normal range of motion.     Cervical back: Normal range of motion.  Lymphadenopathy:     Cervical: No cervical adenopathy.  Skin:    General: Skin is warm and dry.     Findings: No erythema or rash.  Neurological:     Mental Status: She is alert and oriented to person, place, and time.  Psychiatric:        Behavior: Behavior normal.        Thought Content: Thought content normal.        Judgment: Judgment normal.      Lab Results  Component Value Date   WBC 5.3 01/26/2022   HGB 6.3 (LL) 01/26/2022   HCT 20.2 (L) 01/26/2022   MCV 87.8 01/26/2022   PLT 247 01/26/2022     Chemistry      Component Value Date/Time   NA 138 01/26/2022 1350   K 3.5 01/26/2022 1350   CL 105 01/26/2022 1350   CO2 20 (L) 01/26/2022 1350   BUN 43 (H) 01/26/2022 1350   CREATININE 3.49 (HH) 01/26/2022 1350      Component Value Date/Time   CALCIUM 9.5 01/26/2022 1350   ALKPHOS 79 01/26/2022 1350   AST 8 (L) 01/26/2022 1350   ALT <5 01/26/2022 1350    BILITOT 0.3 01/26/2022 1350      Impression and Plan: Jaime Gomez is a very charming 56 year old white female.  She has metastatic endometrial cancer.  She is  on Afinitor/letrozole.  The PET scan shows that she seems to be progressing.  However, she feels good.  Our options for her really are not all that great.  It will be interesting to see what the latest CA 125 is.  Hopefully, the radiation therapy will help a little bit.  She will start this today.  Hopefully this will make a difference for her and give her some better quality of life.  We will be interesting to see what the liquid biopsy shows.  Hopefully there is a genetic mutation that we might be able to target.  She will need to be transfused.  I will go ahead and set this up for her.  I know this will make her feel better.  It always does.  We will plan to see her back in another couple weeks or so.  I will check daily to see about the molecular markers that we did on the liquid biopsy.  If there is no targetable mutations, then we will have to figure out what her options are with respect to systemic therapy.   [name], MD 7/7/20237:07 AM

## 2022-01-27 NOTE — Patient Instructions (Signed)
Anemia  Anemia is a condition in which there is not enough red blood cells or hemoglobin in the blood. Hemoglobin is a substance in red blood cells that carries oxygen. When you do not have enough red blood cells or hemoglobin (are anemic), your body cannot get enough oxygen and your organs may not work properly. As a result, you may feel very tired or have other problems. What are the causes? Common causes of anemia include: Excessive bleeding. Anemia can be caused by excessive bleeding inside or outside the body, including bleeding from the intestines or from heavy menstrual periods in females. Poor nutrition. Long-lasting (chronic) kidney, thyroid, and liver disease. Bone marrow disorders, spleen problems, and blood disorders. Cancer and treatments for cancer. HIV (human immunodeficiency virus) and AIDS (acquired immunodeficiency syndrome). Infections, medicines, and autoimmune disorders that destroy red blood cells. What are the signs or symptoms? Symptoms of this condition include: Minor weakness. Dizziness. Headache, or difficulties concentrating and sleeping. Heartbeats that feel irregular or faster than normal (palpitations). Shortness of breath, especially with exercise. Pale skin, lips, and nails, or cold hands and feet. Indigestion and nausea. Symptoms may occur suddenly or develop slowly. If your anemia is mild, you may not have symptoms. How is this diagnosed? This condition is diagnosed based on blood tests, your medical history, and a physical exam. In some cases, a test may be needed in which cells are removed from the soft tissue inside of a bone and looked at under a microscope (bone marrow biopsy). Your health care provider may also check your stool (feces) for blood and may do additional testing to look for the cause of your bleeding. Other tests may include: Imaging tests, such as a CT scan or MRI. A procedure to see inside your esophagus and stomach (endoscopy). A  procedure to see inside your colon and rectum (colonoscopy). How is this treated? Treatment for this condition depends on the cause. If you continue to lose a lot of blood, you may need to be treated at a hospital. Treatment may include: Taking supplements of iron, vitamin B12, or folic acid. Taking a hormone medicine (erythropoietin) that can help to stimulate red blood cell growth. Having a blood transfusion. This may be needed if you lose a lot of blood. Making changes to your diet. Having surgery to remove your spleen. Follow these instructions at home: Take over-the-counter and prescription medicines only as told by your health care provider. Take supplements only as told by your health care provider. Follow any diet instructions that you were given by your health care provider. Keep all follow-up visits as told by your health care provider. This is important. Contact a health care provider if: You develop new bleeding anywhere in the body. Get help right away if: You are very weak. You are short of breath. You have pain in your abdomen or chest. You are dizzy or feel faint. You have trouble concentrating. You have bloody stools, black stools, or tarry stools. You vomit repeatedly or you vomit up blood. These symptoms may represent a serious problem that is an emergency. Do not wait to see if the symptoms will go away. Get medical help right away. Call your local emergency services (911 in the [place] Do not drive yourself to the hospital. Summary Anemia is a condition in which you do not have enough red blood cells or enough of a substance in your red blood cells that carries oxygen (hemoglobin). Symptoms may occur suddenly or develop slowly. If your anemia   is mild, you may not have symptoms. This condition is diagnosed with blood tests, a medical history, and a physical exam. Other tests may be needed. Treatment for this condition depends on the cause of the anemia. This  information is not intended to replace advice given to you by your health care provider. Make sure you discuss any questions you have with your health care provider. Document Revised: 05/24/2021 Document Reviewed: 06/17/2019 Elsevier Patient Education  2023 [employer] Inc[employer]  

## 2022-01-28 LAB — TYPE AND SCREEN
ABO/RH(D): A POS
Antibody Screen: POSITIVE
DAT, IgG: NEGATIVE
Donor AG Type: NEGATIVE
Donor AG Type: NEGATIVE
Unit division: 0
Unit division: 0

## 2022-01-28 LAB — BPAM RBC
Blood Product Expiration Date: 202308092359
Blood Product Expiration Date: 202308092359
ISSUE DATE / TIME: 202307070812
ISSUE DATE / TIME: 202307070812
Unit Type and Rh: 9500
Unit Type and Rh: 9500

## 2022-01-30 ENCOUNTER — Ambulatory Visit
Admission: RE | Admit: 2022-01-30 | Discharge: 2022-01-30 | Disposition: A | Discharge status: Home / Self Care | Payer: BC Managed Care – PPO | Source: Ambulatory Visit | Attending: Radiation Oncology | Admitting: Radiation Oncology

## 2022-01-30 ENCOUNTER — Other Ambulatory Visit: Payer: Self-pay

## 2022-01-30 DIAGNOSIS — Z510 Encounter for antineoplastic radiation therapy: Secondary | ICD-10-CM | POA: Diagnosis not present

## 2022-01-30 LAB — RAD ONC ARIA SESSION SUMMARY
Course Elapsed Days: 4
Plan Fractions Treated to Date: 3
Plan Prescribed Dose Per Fraction: 3 Gy
Plan Total Fractions Prescribed: 10
Plan Total Prescribed Dose: 30 Gy
Reference Point Dosage Given to Date: 9 Gy
Reference Point Session Dosage Given: 3 Gy
Session Number: 3

## 2022-01-31 ENCOUNTER — Ambulatory Visit
Admission: RE | Admit: 2022-01-31 | Discharge: 2022-01-31 | Disposition: A | Discharge status: Home / Self Care | Payer: BC Managed Care – PPO | Source: Ambulatory Visit | Attending: Radiation Oncology | Admitting: Radiation Oncology

## 2022-01-31 ENCOUNTER — Other Ambulatory Visit: Payer: Self-pay

## 2022-01-31 ENCOUNTER — Ambulatory Visit: Payer: BC Managed Care – PPO

## 2022-01-31 DIAGNOSIS — Z510 Encounter for antineoplastic radiation therapy: Secondary | ICD-10-CM | POA: Diagnosis not present

## 2022-01-31 LAB — RAD ONC ARIA SESSION SUMMARY
Course Elapsed Days: 5
Plan Fractions Treated to Date: 4
Plan Prescribed Dose Per Fraction: 3 Gy
Plan Total Fractions Prescribed: 10
Plan Total Prescribed Dose: 30 Gy
Reference Point Dosage Given to Date: 12 Gy
Reference Point Session Dosage Given: 3 Gy
Session Number: 4

## 2022-02-01 ENCOUNTER — Other Ambulatory Visit: Payer: Self-pay

## 2022-02-01 ENCOUNTER — Ambulatory Visit
Admission: RE | Admit: 2022-02-01 | Discharge: 2022-02-01 | Disposition: A | Discharge status: Home / Self Care | Payer: BC Managed Care – PPO | Source: Ambulatory Visit | Attending: Radiation Oncology | Admitting: Radiation Oncology

## 2022-02-01 DIAGNOSIS — Z510 Encounter for antineoplastic radiation therapy: Secondary | ICD-10-CM | POA: Diagnosis not present

## 2022-02-01 LAB — RAD ONC ARIA SESSION SUMMARY
Course Elapsed Days: 6
Plan Fractions Treated to Date: 5
Plan Prescribed Dose Per Fraction: 3 Gy
Plan Total Fractions Prescribed: 10
Plan Total Prescribed Dose: 30 Gy
Reference Point Dosage Given to Date: 15 Gy
Reference Point Session Dosage Given: 3 Gy
Session Number: 5

## 2022-02-02 ENCOUNTER — Ambulatory Visit: Payer: BC Managed Care – PPO

## 2022-02-03 ENCOUNTER — Ambulatory Visit
Admission: RE | Admit: 2022-02-03 | Discharge: 2022-02-03 | Disposition: A | Discharge status: Home / Self Care | Payer: BC Managed Care – PPO | Source: Ambulatory Visit | Attending: Radiation Oncology | Admitting: Radiation Oncology

## 2022-02-03 ENCOUNTER — Other Ambulatory Visit: Payer: Self-pay

## 2022-02-03 DIAGNOSIS — Z510 Encounter for antineoplastic radiation therapy: Secondary | ICD-10-CM | POA: Diagnosis not present

## 2022-02-03 LAB — RAD ONC ARIA SESSION SUMMARY
Course Elapsed Days: 8
Plan Fractions Treated to Date: 6
Plan Prescribed Dose Per Fraction: 3 Gy
Plan Total Fractions Prescribed: 10
Plan Total Prescribed Dose: 30 Gy
Reference Point Dosage Given to Date: 18 Gy
Reference Point Session Dosage Given: 3 Gy
Session Number: 6

## 2022-02-06 ENCOUNTER — Ambulatory Visit
Admission: RE | Admit: 2022-02-06 | Discharge: 2022-02-06 | Disposition: A | Discharge status: Home / Self Care | Payer: BC Managed Care – PPO | Source: Ambulatory Visit | Attending: Radiation Oncology | Admitting: Radiation Oncology

## 2022-02-06 ENCOUNTER — Other Ambulatory Visit: Payer: Self-pay

## 2022-02-06 ENCOUNTER — Other Ambulatory Visit: Payer: Self-pay | Admitting: *Deleted

## 2022-02-06 DIAGNOSIS — C541 Malignant neoplasm of endometrium: Secondary | ICD-10-CM

## 2022-02-06 DIAGNOSIS — Z510 Encounter for antineoplastic radiation therapy: Secondary | ICD-10-CM | POA: Diagnosis not present

## 2022-02-06 LAB — RAD ONC ARIA SESSION SUMMARY
Course Elapsed Days: 11
Plan Fractions Treated to Date: 7
Plan Prescribed Dose Per Fraction: 3 Gy
Plan Total Fractions Prescribed: 10
Plan Total Prescribed Dose: 30 Gy
Reference Point Dosage Given to Date: 21 Gy
Reference Point Session Dosage Given: 3 Gy
Session Number: 7

## 2022-02-06 MED ORDER — OXYCODONE HCL 5 MG PO CAPS: 5.0000 mg | ORAL_CAPSULE | ORAL | 0 refills | Status: DC | PRN

## 2022-02-07 ENCOUNTER — Ambulatory Visit
Admission: RE | Admit: 2022-02-07 | Discharge: 2022-02-07 | Disposition: A | Discharge status: Home / Self Care | Payer: BC Managed Care – PPO | Source: Ambulatory Visit | Attending: Radiation Oncology | Admitting: Radiation Oncology

## 2022-02-07 ENCOUNTER — Inpatient Hospital Stay: Payer: BC Managed Care – PPO

## 2022-02-07 ENCOUNTER — Inpatient Hospital Stay (HOSPITAL_BASED_OUTPATIENT_CLINIC_OR_DEPARTMENT_OTHER): Payer: BC Managed Care – PPO | Admitting: Hematology & Oncology

## 2022-02-07 ENCOUNTER — Other Ambulatory Visit: Payer: Self-pay

## 2022-02-07 VITALS — BP 126/66 | HR 76 | Temp 98.2°F | Resp 17 | Wt 146.1 lb

## 2022-02-07 DIAGNOSIS — D631 Anemia in chronic kidney disease: Secondary | ICD-10-CM

## 2022-02-07 DIAGNOSIS — N189 Chronic kidney disease, unspecified: Secondary | ICD-10-CM | POA: Diagnosis not present

## 2022-02-07 DIAGNOSIS — C541 Malignant neoplasm of endometrium: Secondary | ICD-10-CM | POA: Diagnosis not present

## 2022-02-07 DIAGNOSIS — Z510 Encounter for antineoplastic radiation therapy: Secondary | ICD-10-CM | POA: Diagnosis not present

## 2022-02-07 DIAGNOSIS — Z95828 Presence of other vascular implants and grafts: Secondary | ICD-10-CM

## 2022-02-07 LAB — RAD ONC ARIA SESSION SUMMARY
Course Elapsed Days: 12
Plan Fractions Treated to Date: 8
Plan Prescribed Dose Per Fraction: 3 Gy
Plan Total Fractions Prescribed: 10
Plan Total Prescribed Dose: 30 Gy
Reference Point Dosage Given to Date: 24 Gy
Reference Point Session Dosage Given: 3 Gy
Session Number: 8

## 2022-02-07 LAB — CBC WITH DIFFERENTIAL (CANCER CENTER ONLY)
Abs Immature Granulocytes: 0.04 10*3/uL (ref 0.00–0.07)
Basophils Absolute: 0 10*3/uL (ref 0.0–0.1)
Basophils Relative: 1 %
Eosinophils Absolute: 0.2 10*3/uL (ref 0.0–0.5)
Eosinophils Relative: 5 %
HCT: 27.2 % — ABNORMAL LOW (ref 36.0–46.0)
Hemoglobin: 9 g/dL — ABNORMAL LOW (ref 12.0–15.0)
Immature Granulocytes: 1 %
Lymphocytes Relative: 15 %
Lymphs Abs: 0.7 10*3/uL (ref 0.7–4.0)
MCH: 28.4 pg (ref 26.0–34.0)
MCHC: 33.1 g/dL (ref 30.0–36.0)
MCV: 85.8 fL (ref 80.0–100.0)
Monocytes Absolute: 0.4 10*3/uL (ref 0.1–1.0)
Monocytes Relative: 9 %
Neutro Abs: 2.9 10*3/uL (ref 1.7–7.7)
Neutrophils Relative %: 69 %
Platelet Count: 218 10*3/uL (ref 150–400)
RBC: 3.17 MIL/uL — ABNORMAL LOW (ref 3.87–5.11)
RDW: 14.5 % (ref 11.5–15.5)
WBC Count: 4.2 10*3/uL (ref 4.0–10.5)
nRBC: 0 % (ref 0.0–0.2)

## 2022-02-07 LAB — CMP (CANCER CENTER ONLY)
ALT: <5 U/L (ref 0–44)
AST: 8 U/L — ABNORMAL LOW (ref 15–41)
Albumin: 3.9 g/dL (ref 3.5–5.0)
Alkaline Phosphatase: 73 U/L (ref 38–126)
Anion gap: 11 (ref 5–15)
BUN: 40 mg/dL — ABNORMAL HIGH (ref 6–20)
CO2: 20 mmol/L — ABNORMAL LOW (ref 22–32)
Calcium: 9.4 mg/dL (ref 8.9–10.3)
Chloride: 101 mmol/L (ref 98–111)
Creatinine: 2.74 mg/dL — ABNORMAL HIGH (ref 0.44–1.00)
GFR, Estimated: 20 mL/min — ABNORMAL LOW (ref >60)
Glucose, Bld: 320 mg/dL — ABNORMAL HIGH (ref 70–99)
Potassium: 3.3 mmol/L — ABNORMAL LOW (ref 3.5–5.1)
Sodium: 132 mmol/L — ABNORMAL LOW (ref 135–145)
Total Bilirubin: 0.5 mg/dL (ref 0.3–1.2)
Total Protein: 7 g/dL (ref 6.5–8.1)

## 2022-02-07 LAB — SAMPLE TO BLOOD BANK

## 2022-02-07 LAB — PREALBUMIN: Prealbumin: 15 mg/dL — ABNORMAL LOW (ref 18–38)

## 2022-02-07 MED ORDER — HEPARIN SOD (PORK) LOCK FLUSH 100 UNIT/ML IV SOLN
500.0000 [IU] | Freq: Once | INTRAVENOUS | Status: AC
  Administered 2022-02-07: 500 [IU] via INTRAVENOUS

## 2022-02-07 MED ORDER — SODIUM CHLORIDE 0.9% FLUSH
10.0000 mL | Freq: Once | INTRAVENOUS | Status: AC
  Administered 2022-02-07: 10 mL via INTRAVENOUS

## 2022-02-07 NOTE — Progress Notes (Signed)
Hematology and Oncology Follow Up Visit  Jaime Gomez [id number] Jul 23, 1966 56 y.o. 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 twice daily     Interim History:  Jaime Gomez is back for follow-up.  She is looking a lot better.  She feels better.  The blood that we gave her definitely has helped which is good to see.  We did get back her liquid biopsy.  Unfortunately, there really was not much that we could find that we could target.  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  Her renal function is doing better.  She still has a nephrostomy tube in place.  Hopefully, Urology might be able to internalize this.  She is still getting radiation therapy.  She has 2 more days of radiation therapy to the left hip.  She says that she is feeling a bit better in the hip area.  She says she is called her blood a little bit.  She thinks is more from irritation caused by the humid air.  Her last CA-125 was holding relatively stable at 270.  Her appetite is doing quite well..  She had not have any problems with her bowels.  There is no diarrhea.  She is really not had any problems with leg swelling.  She is doing well on the letrozole/Afinitor.  She really would like to continue on this.  This is helping her quality of life.  Currently, I would have said that her performance status is probably ECOG 1.   Medications:  Current Outpatient Medications:    apixaban (ELIQUIS) 2.5 MG TABS tablet, Take 1 tablet (2.5 mg total) by mouth 2 (two) times daily., Disp: 60 tablet, Rfl: 12   atorvastatin (LIPITOR) 10 MG tablet, Take 10 mg by mouth daily at 6 (six) AM., Disp: , Rfl:    carvedilol (COREG) 25 MG tablet, Take 25 mg by mouth daily at 6 (six) AM.,  Disp: , Rfl:    everolimus (AFINITOR) 10 MG tablet, Take 10 mg by mouth daily at 6 (six) AM., Disp: , Rfl:    folic acid (FOLVITE) 1 MG tablet, Take 1 mg by mouth daily at 6 (six) AM., Disp: , Rfl:    gabapentin (NEURONTIN) 100 MG capsule, Take 100 mg by mouth 2 (two) times daily., Disp: , Rfl:    insulin aspart protamine - aspart (NOVOLOG MIX 70/30 FLEXPEN) (70-30) 100 UNIT/ML FlexPen, Inject 20 Units into the skin as directed. Per meal, Disp: , Rfl:    insulin detemir (LEVEMIR) 100 UNIT/ML injection, Inject 20 Units into the skin 2 (two) times daily., Disp: , Rfl:    letrozole (FEMARA) 2.5 MG tablet, Take 2.5 mg by mouth daily at 6 (six) AM., Disp: , Rfl:    mirabegron ER (MYRBETRIQ) 25 MG TB24 tablet, Take 1 tablet (25 mg total) by mouth daily for 360 doses., Disp: 30 tablet, Rfl: 11   nitrofurantoin, macrocrystal-monohydrate, (MACROBID) 100 MG capsule, Take 1 capsule (100 mg total) by mouth 2 (two) times daily., Disp: 180 capsule, Rfl: 6   ondansetron (ZOFRAN) 8 MG tablet, Take 8 mg by mouth every 8 (eight) hours as needed., Disp: , Rfl:    oxycodone (OXY-IR) 5 MG capsule, Take 1 capsule (5 mg total) by mouth every 4 (four) hours as needed., Disp: 120 capsule,  Rfl: 0   polyethylene glycol (MIRALAX / GLYCOLAX) 17 g packet, Take 17 g by mouth daily as needed for mild constipation., Disp: 14 each, Rfl: 0   pravastatin (PRAVACHOL) 80 MG tablet, Take 80 mg by mouth daily at 6 (six) AM., Disp: , Rfl:    prochlorperazine (COMPAZINE) 10 MG tablet, Take 10 mg by mouth every 6 (six) hours., Disp: , Rfl:    pantoprazole (PROTONIX) 40 MG tablet, Take 40 mg by mouth in the morning. (Patient not taking: Reported on 02/07/2022), Disp: , Rfl:   Allergies:  Allergies  Allergen Reactions   Taxotere [Docetaxel]     Throat swoll    Past Medical History, Surgical history, Social history, and Family History were reviewed and updated.  Review of Systems: Review of Systems  Constitutional:  Positive for  fatigue.  HENT:  Negative.    Eyes: Negative.   Respiratory: Negative.    Cardiovascular:  Positive for leg swelling.  Gastrointestinal:  Positive for abdominal pain.  Genitourinary:  Positive for pelvic pain.   Musculoskeletal: Negative.   Skin: Negative.   Neurological: Negative.   Hematological: Negative.   Psychiatric/Behavioral: Negative.      Physical Exam:  weight is 146 lb 1.9 oz (66.3 kg). Her oral temperature is 98.2 F (36.8 C). Her blood pressure is 126/66 and her pulse is 76. Her respiration is 17 and oxygen saturation is 100%.   Wt Readings from Last 3 Encounters:  02/07/22 146 lb 1.9 oz (66.3 kg)  01/26/22 149 lb (67.6 kg)  01/20/22 147 lb (66.7 kg)    Physical Exam Vitals reviewed.  HENT:     Head: Normocephalic and atraumatic.  Eyes:     Pupils: Pupils are equal, round, and reactive to light.  Cardiovascular:     Rate and Rhythm: Normal rate and regular rhythm.     Heart sounds: Normal heart sounds.  Pulmonary:     Effort: Pulmonary effort is normal.     Breath sounds: Normal breath sounds.  Abdominal:     General: Bowel sounds are normal.     Palpations: Abdomen is soft.  Musculoskeletal:        General: No tenderness or deformity. Normal range of motion.     Cervical back: Normal range of motion.  Lymphadenopathy:     Cervical: No cervical adenopathy.  Skin:    General: Skin is warm and dry.     Findings: No erythema or rash.  Neurological:     Mental Status: She is alert and oriented to person, place, and time.  Psychiatric:        Behavior: Behavior normal.        Thought Content: Thought content normal.        Judgment: Judgment normal.     Lab Results  Component Value Date   WBC 4.2 02/07/2022   HGB 9.0 (L) 02/07/2022   HCT 27.2 (L) 02/07/2022   MCV 85.8 02/07/2022   PLT 218 02/07/2022     Chemistry      Component Value Date/Time   NA 132 (L) 02/07/2022 1512   K 3.3 (L) 02/07/2022 1512   CL 101 02/07/2022 1512   CO2 20 (L)  02/07/2022 1512   BUN 40 (H) 02/07/2022 1512   CREATININE 2.74 (H) 02/07/2022 1512      Component Value Date/Time   CALCIUM 9.4 02/07/2022 1512   ALKPHOS 73 02/07/2022 1512   AST 8 (L) 02/07/2022 1512   ALT <5 02/07/2022 1512  BILITOT 0.5 02/07/2022 1512      Impression and Plan: Jaime Gomez is a very charming 56 year old white female.  She has metastatic endometrial cancer.  She is on Afinitor/letrozole.  The PET scan that she had done back in May shows that she was progressing.  Unfortunately, we do not have any actionable mutations that we could try to target.  She is doing well on the letrozole/Afinitor combination.  As such, I think that it be worthwhile keeping her on this right now.  She will finish the radiation therapy to her hip in 2 days.  I probably would set her up with a PET scan at the end of August so we can see how everything looks.  Maybe, at that time, we can see about making a change in her protocol.  We will see what her CA-125 looks like.  Again, she is most concerned about her quality of life.  She wants to have a decent quality of life.  I totally agree with this.  I think this is incredibly important and should be our top priority.  We will plan to get her back to see [place] in another 3 weeks or so just to follow-up to make sure everything still going okay with her.    [name], MD 7/18/20234:16 PM

## 2022-02-08 ENCOUNTER — Encounter: Payer: Self-pay | Admitting: *Deleted

## 2022-02-08 ENCOUNTER — Ambulatory Visit: Payer: BC Managed Care – PPO

## 2022-02-08 ENCOUNTER — Telehealth: Payer: Self-pay | Admitting: *Deleted

## 2022-02-08 ENCOUNTER — Other Ambulatory Visit: Payer: Self-pay

## 2022-02-08 ENCOUNTER — Ambulatory Visit
Admission: RE | Admit: 2022-02-08 | Discharge: 2022-02-08 | Disposition: A | Discharge status: Home / Self Care | Payer: BC Managed Care – PPO | Source: Ambulatory Visit | Attending: Radiation Oncology | Admitting: Radiation Oncology

## 2022-02-08 DIAGNOSIS — Z510 Encounter for antineoplastic radiation therapy: Secondary | ICD-10-CM | POA: Diagnosis not present

## 2022-02-08 LAB — CA 125: Cancer Antigen (CA) 125: 262 U/mL — ABNORMAL HIGH (ref 0.0–38.1)

## 2022-02-08 LAB — RAD ONC ARIA SESSION SUMMARY
Course Elapsed Days: 13
Plan Fractions Treated to Date: 9
Plan Prescribed Dose Per Fraction: 3 Gy
Plan Total Fractions Prescribed: 10
Plan Total Prescribed Dose: 30 Gy
Reference Point Dosage Given to Date: 27 Gy
Reference Point Session Dosage Given: 3 Gy
Session Number: 9

## 2022-02-08 NOTE — Telephone Encounter (Signed)
Received documents from [place] requesting medical records - sent documents to [facility] through intra-office mail.

## 2022-02-09 ENCOUNTER — Ambulatory Visit
Admission: RE | Admit: 2022-02-09 | Discharge: 2022-02-09 | Disposition: A | Discharge status: Home / Self Care | Payer: BC Managed Care – PPO | Source: Ambulatory Visit | Attending: Radiation Oncology | Admitting: Radiation Oncology

## 2022-02-09 ENCOUNTER — Encounter: Payer: Self-pay | Admitting: Radiation Oncology

## 2022-02-09 ENCOUNTER — Other Ambulatory Visit: Payer: Self-pay

## 2022-02-09 DIAGNOSIS — Z510 Encounter for antineoplastic radiation therapy: Secondary | ICD-10-CM | POA: Diagnosis not present

## 2022-02-09 LAB — RAD ONC ARIA SESSION SUMMARY
Course Elapsed Days: 14
Plan Fractions Treated to Date: 10
Plan Prescribed Dose Per Fraction: 3 Gy
Plan Total Fractions Prescribed: 10
Plan Total Prescribed Dose: 30 Gy
Reference Point Dosage Given to Date: 30 Gy
Reference Point Session Dosage Given: 3 Gy
Session Number: 10

## 2022-03-03 ENCOUNTER — Inpatient Hospital Stay: Payer: BC Managed Care – PPO

## 2022-03-03 ENCOUNTER — Inpatient Hospital Stay (HOSPITAL_BASED_OUTPATIENT_CLINIC_OR_DEPARTMENT_OTHER): Payer: BC Managed Care – PPO | Admitting: Hematology & Oncology

## 2022-03-03 ENCOUNTER — Encounter: Payer: Self-pay | Admitting: Hematology & Oncology

## 2022-03-03 ENCOUNTER — Inpatient Hospital Stay: Payer: BC Managed Care – PPO | Attending: Hematology & Oncology

## 2022-03-03 VITALS — BP 140/83 | HR 87 | Temp 98.1°F | Resp 16 | Ht 65.0 in | Wt 143.5 lb

## 2022-03-03 DIAGNOSIS — C541 Malignant neoplasm of endometrium: Secondary | ICD-10-CM

## 2022-03-03 DIAGNOSIS — Z7901 Long term (current) use of anticoagulants: Secondary | ICD-10-CM | POA: Diagnosis not present

## 2022-03-03 DIAGNOSIS — I82402 Acute embolism and thrombosis of unspecified deep veins of left lower extremity: Secondary | ICD-10-CM | POA: Diagnosis not present

## 2022-03-03 DIAGNOSIS — Z79811 Long term (current) use of aromatase inhibitors: Secondary | ICD-10-CM | POA: Diagnosis not present

## 2022-03-03 DIAGNOSIS — N189 Chronic kidney disease, unspecified: Secondary | ICD-10-CM

## 2022-03-03 DIAGNOSIS — I82501 Chronic embolism and thrombosis of unspecified deep veins of right lower extremity: Secondary | ICD-10-CM | POA: Diagnosis not present

## 2022-03-03 DIAGNOSIS — D631 Anemia in chronic kidney disease: Secondary | ICD-10-CM

## 2022-03-03 LAB — RETICULOCYTES
Immature Retic Fract: 14.1 % (ref 2.3–15.9)
RBC.: 2.91 MIL/uL — ABNORMAL LOW (ref 3.87–5.11)
Retic Count, Absolute: 41.3 10*3/uL (ref 19.0–186.0)
Retic Ct Pct: 1.4 % (ref 0.4–3.1)

## 2022-03-03 LAB — CBC WITH DIFFERENTIAL (CANCER CENTER ONLY)
Abs Immature Granulocytes: 0.03 10*3/uL (ref 0.00–0.07)
Basophils Absolute: 0 10*3/uL (ref 0.0–0.1)
Basophils Relative: 1 %
Eosinophils Absolute: 0.2 10*3/uL (ref 0.0–0.5)
Eosinophils Relative: 5 %
HCT: 25 % — ABNORMAL LOW (ref 36.0–46.0)
Hemoglobin: 8 g/dL — ABNORMAL LOW (ref 12.0–15.0)
Immature Granulocytes: 1 %
Lymphocytes Relative: 16 %
Lymphs Abs: 0.6 10*3/uL — ABNORMAL LOW (ref 0.7–4.0)
MCH: 27.4 pg (ref 26.0–34.0)
MCHC: 32 g/dL (ref 30.0–36.0)
MCV: 85.6 fL (ref 80.0–100.0)
Monocytes Absolute: 0.2 10*3/uL (ref 0.1–1.0)
Monocytes Relative: 6 %
Neutro Abs: 2.5 10*3/uL (ref 1.7–7.7)
Neutrophils Relative %: 71 %
Platelet Count: 220 10*3/uL (ref 150–400)
RBC: 2.92 MIL/uL — ABNORMAL LOW (ref 3.87–5.11)
RDW: 14.6 % (ref 11.5–15.5)
WBC Count: 3.5 10*3/uL — ABNORMAL LOW (ref 4.0–10.5)
nRBC: 0 % (ref 0.0–0.2)

## 2022-03-03 LAB — CMP (CANCER CENTER ONLY)
ALT: <5 U/L (ref 0–44)
AST: 8 U/L — ABNORMAL LOW (ref 15–41)
Albumin: 3.9 g/dL (ref 3.5–5.0)
Alkaline Phosphatase: 80 U/L (ref 38–126)
Anion gap: 13 (ref 5–15)
BUN: 45 mg/dL — ABNORMAL HIGH (ref 6–20)
CO2: 18 mmol/L — ABNORMAL LOW (ref 22–32)
Calcium: 10 mg/dL (ref 8.9–10.3)
Chloride: 107 mmol/L (ref 98–111)
Creatinine: 2.98 mg/dL — ABNORMAL HIGH (ref 0.44–1.00)
GFR, Estimated: 18 mL/min — ABNORMAL LOW (ref >60)
Glucose, Bld: 125 mg/dL — ABNORMAL HIGH (ref 70–99)
Potassium: 3.6 mmol/L (ref 3.5–5.1)
Sodium: 138 mmol/L (ref 135–145)
Total Bilirubin: 0.4 mg/dL (ref 0.3–1.2)
Total Protein: 7 g/dL (ref 6.5–8.1)

## 2022-03-03 LAB — FERRITIN: Ferritin: 1455 ng/mL — ABNORMAL HIGH (ref 11–307)

## 2022-03-03 NOTE — Progress Notes (Signed)
Hematology and Oncology Follow Up Visit  Jaime Gomez [id number] [birth year] 56 y.o. 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 twice daily --increase to 5 mg p.o. twice daily on 03/03/2022     Interim History:  [name] is back for follow-up.  So far, everything is doing okay.  Unfortunately, her left leg is more swollen.  She thinks she has another blood clot in the leg.  However, she does not want to have a Doppler until next week.  We will set this up for next Wednesday.  She had her last CA-125 at 262.  This is holding steady.  She continues on the Afinitor and letrozole.  She seems to doing pretty well with this.  She really has had very few side effects.  She completed radiation therapy to the left hip.  She feels better.  She says she has less pain in her tailbone.  I think she sees urology on Monday to discuss the nephrostomy tube over on the right side.  Hopefully, this can be internalized.  She has had no bleeding.  She does have anemia with a hemoglobin of 8.  She does not feel like she needs any transfusions.  She has had no headache.  There is been no mouth sores.  Her appetite has been doing fairly well.  Overall, I would say her performance status is ECOG 1.   Medications:  Current Outpatient Medications:    apixaban (ELIQUIS) 2.5 MG TABS tablet, Take 1 tablet (2.5 mg total) by mouth 2 (two) times daily., Disp: 60 tablet, Rfl: 12   atorvastatin (LIPITOR) 10 MG tablet, Take 10 mg by mouth daily at 6 (six) AM., Disp: , Rfl:    carvedilol (COREG) 25 MG tablet, Take 25 mg by mouth daily at 6 (six) AM., Disp: , Rfl:    everolimus (AFINITOR) 10 MG tablet, Take 10 mg by mouth daily at 6 (six) AM., Disp: , Rfl:    folic acid (FOLVITE) 1 MG tablet, Take 1 mg by mouth  daily at 6 (six) AM., Disp: , Rfl:    gabapentin (NEURONTIN) 100 MG capsule, Take 100 mg by mouth 2 (two) times daily., Disp: , Rfl:    insulin aspart protamine - aspart (NOVOLOG MIX 70/30 FLEXPEN) (70-30) 100 UNIT/ML FlexPen, Inject 20 Units into the skin as directed. Per meal, Disp: , Rfl:    insulin detemir (LEVEMIR) 100 UNIT/ML injection, Inject 20 Units into the skin 2 (two) times daily., Disp: , Rfl:    letrozole (FEMARA) 2.5 MG tablet, Take 2.5 mg by mouth daily at 6 (six) AM., Disp: , Rfl:    mirabegron ER (MYRBETRIQ) 25 MG TB24 tablet, Take 1 tablet (25 mg total) by mouth daily for 360 doses., Disp: 30 tablet, Rfl: 11   nitrofurantoin, macrocrystal-monohydrate, (MACROBID) 100 MG capsule, Take 1 capsule (100 mg total) by mouth 2 (two) times daily., Disp: 180 capsule, Rfl: 6   ondansetron (ZOFRAN) 8 MG tablet, Take 8 mg by mouth every 8 (eight) hours as needed., Disp: , Rfl:    oxycodone (OXY-IR) 5 MG capsule, Take 1 capsule (5 mg total) by mouth every 4 (four) hours as needed., Disp: 120 capsule, Rfl: 0   polyethylene glycol (MIRALAX / GLYCOLAX) 17 g packet, Take 17 g by mouth daily as needed for mild constipation., Disp: 14 each, Rfl: 0  pravastatin (PRAVACHOL) 80 MG tablet, Take 80 mg by mouth daily at 6 (six) AM., Disp: , Rfl:    prochlorperazine (COMPAZINE) 10 MG tablet, Take 10 mg by mouth every 6 (six) hours., Disp: , Rfl:    pantoprazole (PROTONIX) 40 MG tablet, Take 40 mg by mouth in the morning., Disp: , Rfl:   Allergies:  Allergies  Allergen Reactions   Taxotere [Docetaxel]     Throat swoll    Past Medical History, Surgical history, Social history, and Family History were reviewed and updated.  Review of Systems: Review of Systems  Constitutional:  Positive for fatigue.  HENT:  Negative.    Eyes: Negative.   Respiratory: Negative.    Cardiovascular:  Positive for leg swelling.  Gastrointestinal:  Positive for abdominal pain.  Genitourinary:  Positive for pelvic pain.    Musculoskeletal: Negative.   Skin: Negative.   Neurological: Negative.   Hematological: Negative.   Psychiatric/Behavioral: Negative.      Physical Exam:  height is '5\' 5"'$  (1.651 m) and weight is 143 lb 8 oz (65.1 kg). Her oral temperature is 98.1 F (36.7 C). Her blood pressure is 140/83 (abnormal) and her pulse is 87. Her respiration is 16 and oxygen saturation is 97%.   Wt Readings from Last 3 Encounters:  03/03/22 143 lb 8 oz (65.1 kg)  02/07/22 146 lb 1.9 oz (66.3 kg)  01/26/22 149 lb (67.6 kg)    Physical Exam Vitals reviewed.  HENT:     Head: Normocephalic and atraumatic.  Eyes:     Pupils: Pupils are equal, round, and reactive to light.  Cardiovascular:     Rate and Rhythm: Normal rate and regular rhythm.     Heart sounds: Normal heart sounds.  Pulmonary:     Effort: Pulmonary effort is normal.     Breath sounds: Normal breath sounds.  Abdominal:     General: Bowel sounds are normal.     Palpations: Abdomen is soft.  Musculoskeletal:        General: No tenderness or deformity. Normal range of motion.     Cervical back: Normal range of motion.  Lymphadenopathy:     Cervical: No cervical adenopathy.  Skin:    General: Skin is warm and dry.     Findings: No erythema or rash.  Neurological:     Mental Status: She is alert and oriented to person, place, and time.  Psychiatric:        Behavior: Behavior normal.        Thought Content: Thought content normal.        Judgment: Judgment normal.      Lab Results  Component Value Date   WBC 3.5 (L) 03/03/2022   HGB 8.0 (L) 03/03/2022   HCT 25.0 (L) 03/03/2022   MCV 85.6 03/03/2022   PLT 220 03/03/2022     Chemistry      Component Value Date/Time   NA 138 03/03/2022 1141   K 3.6 03/03/2022 1141   CL 107 03/03/2022 1141   CO2 18 (L) 03/03/2022 1141   BUN 45 (H) 03/03/2022 1141   CREATININE 2.98 (H) 03/03/2022 1141      Component Value Date/Time   CALCIUM 10.0 03/03/2022 1141   ALKPHOS 80 03/03/2022  1141   AST 8 (L) 03/03/2022 1141   ALT <5 03/03/2022 1141   BILITOT 0.4 03/03/2022 1141      Impression and Plan: [name] is a very charming 56 year old white female.  She has metastatic endometrial  cancer.  She is on Afinitor/letrozole.  The PET scan that she had done back in May shows that she was progressing.  Unfortunately, we do not have any actionable mutations that we could try to target.  She is doing well on the letrozole/Afinitor combination.  As such, I think that it be worthwhile keeping her on this right now.  I am glad that the radiation therapy has helped her.  We will have to see what the Doppler shows of her left leg.  I told her to increase the Eliquis to 5 mg p.o. twice daily.  We may have to think about just keeping her on 5 mg twice daily from here on out.  I am just happy that her quality life, overall, is still doing so well.  I would like to get her back to see [place] in another month or so.   [name], MD 8/11/20231:04 PM

## 2022-03-03 NOTE — Patient Instructions (Signed)

## 2022-03-04 LAB — CA 125: Cancer Antigen (CA) 125: 260 U/mL — ABNORMAL HIGH (ref 0.0–38.1)

## 2022-03-06 ENCOUNTER — Telehealth (HOSPITAL_BASED_OUTPATIENT_CLINIC_OR_DEPARTMENT_OTHER): Payer: Self-pay

## 2022-03-06 LAB — IRON AND IRON BINDING CAPACITY (CC-WL,HP ONLY)
Iron: 48 ug/dL (ref 28–170)
Saturation Ratios: 25 % (ref 10.4–31.8)
TIBC: 195 ug/dL — ABNORMAL LOW (ref 250–450)
UIBC: 147 ug/dL — ABNORMAL LOW (ref 148–442)

## 2022-03-09 ENCOUNTER — Other Ambulatory Visit: Payer: Self-pay

## 2022-03-09 DIAGNOSIS — C541 Malignant neoplasm of endometrium: Secondary | ICD-10-CM

## 2022-03-09 MED ORDER — OXYCODONE HCL 5 MG PO CAPS: 5.0000 mg | ORAL_CAPSULE | ORAL | 0 refills | Status: DC | PRN

## 2022-03-10 ENCOUNTER — Other Ambulatory Visit: Payer: Self-pay

## 2022-03-10 ENCOUNTER — Inpatient Hospital Stay (HOSPITAL_BASED_OUTPATIENT_CLINIC_OR_DEPARTMENT_OTHER): Payer: BC Managed Care – PPO | Admitting: Hematology & Oncology

## 2022-03-10 ENCOUNTER — Encounter: Payer: Self-pay | Admitting: Hematology & Oncology

## 2022-03-10 ENCOUNTER — Ambulatory Visit (HOSPITAL_BASED_OUTPATIENT_CLINIC_OR_DEPARTMENT_OTHER)
Admission: RE | Admit: 2022-03-10 | Discharge: 2022-03-10 | Disposition: A | Discharge status: Home / Self Care | Payer: BC Managed Care – PPO | Source: Ambulatory Visit | Attending: Hematology & Oncology | Admitting: Hematology & Oncology

## 2022-03-10 VITALS — BP 164/83 | HR 80 | Temp 98.2°F | Resp 16 | Wt 152.0 lb

## 2022-03-10 DIAGNOSIS — C541 Malignant neoplasm of endometrium: Secondary | ICD-10-CM | POA: Insufficient documentation

## 2022-03-10 DIAGNOSIS — I82402 Acute embolism and thrombosis of unspecified deep veins of left lower extremity: Secondary | ICD-10-CM | POA: Diagnosis not present

## 2022-03-10 DIAGNOSIS — I82501 Chronic embolism and thrombosis of unspecified deep veins of right lower extremity: Secondary | ICD-10-CM | POA: Diagnosis present

## 2022-03-10 MED ORDER — FONDAPARINUX SODIUM 7.5 MG/0.6ML ~~LOC~~ SOLN: 7.5000 mg | SUBCUTANEOUS | 6 refills | Status: DC

## 2022-03-10 NOTE — Progress Notes (Signed)
Hematology and Oncology Follow Up Visit  Jaime Gomez [id number] March 20, 1966 56 y.o. 03/10/2022   Principle Diagnosis:  Metastatic adenocarcinoma of the endometrium -- No Actionable mutations Chronic renal failure Recurrent thromboembolic disease  Current Therapy:   Afinitor/letrozole Blood transfusion as needed Eliquis 2.5 mg p.o. twice daily --increase to 5 mg p.o. twice daily on 03/03/2022 -- d/c on 03/13/2022 Arixtra 7.5 mg sq q day --  start on 03/13/2022     Interim History:  Jaime Gomez is back for an unscheduled visit.  Unfortunately, she underwent a Doppler of her left leg.  She has a history of thrombus in the right leg.  She been on Eliquis.  We recently increased the dose up to 5 mg p.o. twice daily.  The Doppler was done this morning.  Distant, unfortunate, showed she had extensive thrombus in the left leg up to the left common femoral vein.  I think we will have to see if IR cannot do some kind of thrombectomy.  I think this would certainly help her quality of life.  I realize that she does have a malignancy that is not curable.  However, she has done incredibly well with treatment.  She has responded to treatment.  I am going to switch her Eliquis out.  I will put her on Arixtra.  I think this would be a reasonable way of trying to treat this now.  She is still able to walk.  I think that she does have an IVC  filter in place.  It is possible that this filter might be filled with blood clot.  She has had no chest pain.  There has been no shortness of breath.  Again, we will have to make a switch on her anticoagulation.  I do think that Arixtra would be a reasonable option for her.  She still is in great shape.  I still want to try to be aggressive with respect to her quality of life.  Currently, I would say performance status is probably ECOG 1.    Medications:  Current Outpatient Medications:    apixaban (ELIQUIS) 2.5 MG TABS tablet, Take 1 tablet (2.5 mg total) by  mouth 2 (two) times daily., Disp: 60 tablet, Rfl: 12   atorvastatin (LIPITOR) 10 MG tablet, Take 10 mg by mouth daily at 6 (six) AM., Disp: , Rfl:    carvedilol (COREG) 25 MG tablet, Take 25 mg by mouth daily at 6 (six) AM., Disp: , Rfl:    everolimus (AFINITOR) 10 MG tablet, Take 10 mg by mouth daily at 6 (six) AM., Disp: , Rfl:    folic acid (FOLVITE) 1 MG tablet, Take 1 mg by mouth daily at 6 (six) AM., Disp: , Rfl:    gabapentin (NEURONTIN) 100 MG capsule, Take 100 mg by mouth 2 (two) times daily., Disp: , Rfl:    insulin aspart protamine - aspart (NOVOLOG MIX 70/30 FLEXPEN) (70-30) 100 UNIT/ML FlexPen, Inject 20 Units into the skin as directed. Per meal, Disp: , Rfl:    insulin detemir (LEVEMIR) 100 UNIT/ML injection, Inject 20 Units into the skin 2 (two) times daily., Disp: , Rfl:    letrozole (FEMARA) 2.5 MG tablet, Take 2.5 mg by mouth daily at 6 (six) AM., Disp: , Rfl:    mirabegron ER (MYRBETRIQ) 25 MG TB24 tablet, Take 1 tablet (25 mg total) by mouth daily for 360 doses., Disp: 30 tablet, Rfl: 11   nitrofurantoin, macrocrystal-monohydrate, (MACROBID) 100 MG capsule, Take 1 capsule (100 mg total) by  mouth 2 (two) times daily., Disp: 180 capsule, Rfl: 6   ondansetron (ZOFRAN) 8 MG tablet, Take 8 mg by mouth every 8 (eight) hours as needed., Disp: , Rfl:    oxycodone (OXY-IR) 5 MG capsule, Take 1 capsule (5 mg total) by mouth every 4 (four) hours as needed., Disp: 120 capsule, Rfl: 0   pantoprazole (PROTONIX) 40 MG tablet, Take 40 mg by mouth in the morning., Disp: , Rfl:    polyethylene glycol (MIRALAX / GLYCOLAX) 17 g packet, Take 17 g by mouth daily as needed for mild constipation., Disp: 14 each, Rfl: 0   pravastatin (PRAVACHOL) 80 MG tablet, Take 80 mg by mouth daily at 6 (six) AM., Disp: , Rfl:    prochlorperazine (COMPAZINE) 10 MG tablet, Take 10 mg by mouth every 6 (six) hours., Disp: , Rfl:   Allergies:  Allergies  Allergen Reactions   Taxotere [Docetaxel]     Throat swoll     Past Medical History, Surgical history, Social history, and Family History were reviewed and updated.  Review of Systems: Review of Systems  Constitutional:  Positive for fatigue.  HENT:  Negative.    Eyes: Negative.   Respiratory: Negative.    Cardiovascular:  Positive for leg swelling.  Gastrointestinal:  Positive for abdominal pain.  Genitourinary:  Positive for pelvic pain.   Musculoskeletal: Negative.   Skin: Negative.   Neurological: Negative.   Hematological: Negative.   Psychiatric/Behavioral: Negative.      Physical Exam:  weight is 152 lb (68.9 kg). Her oral temperature is 98.2 F (36.8 C). Her blood pressure is 164/83 (abnormal) and her pulse is 80. Her respiration is 16 and oxygen saturation is 100%.   Wt Readings from Last 3 Encounters:  03/10/22 152 lb (68.9 kg)  03/03/22 143 lb 8 oz (65.1 kg)  02/07/22 146 lb 1.9 oz (66.3 kg)    Physical Exam Vitals reviewed.  HENT:     Head: Normocephalic and atraumatic.  Eyes:     Pupils: Pupils are equal, round, and reactive to light.  Cardiovascular:     Rate and Rhythm: Normal rate and regular rhythm.     Heart sounds: Normal heart sounds.  Pulmonary:     Effort: Pulmonary effort is normal.     Breath sounds: Normal breath sounds.  Abdominal:     General: Bowel sounds are normal.     Palpations: Abdomen is soft.  Musculoskeletal:        General: No tenderness or deformity. Normal range of motion.     Cervical back: Normal range of motion.  Lymphadenopathy:     Cervical: No cervical adenopathy.  Skin:    General: Skin is warm and dry.     Findings: No erythema or rash.  Neurological:     Mental Status: She is alert and oriented to person, place, and time.  Psychiatric:        Behavior: Behavior normal.        Thought Content: Thought content normal.        Judgment: Judgment normal.      Lab Results  Component Value Date   WBC 3.5 (L) 03/03/2022   HGB 8.0 (L) 03/03/2022   HCT 25.0 (L)  03/03/2022   MCV 85.6 03/03/2022   PLT 220 03/03/2022     Chemistry      Component Value Date/Time   NA 138 03/03/2022 1141   K 3.6 03/03/2022 1141   CL 107 03/03/2022 1141   CO2 18 (L)  03/03/2022 1141   BUN 45 (H) 03/03/2022 1141   CREATININE 2.98 (H) 03/03/2022 1141      Component Value Date/Time   CALCIUM 10.0 03/03/2022 1141   ALKPHOS 80 03/03/2022 1141   AST 8 (L) 03/03/2022 1141   ALT <5 03/03/2022 1141   BILITOT 0.4 03/03/2022 1141      Impression and Plan: Ms. Underhill is a very charming 56 year old white female.  She has metastatic endometrial cancer.  She is on Afinitor/letrozole.  The PET scan that she had done back in May shows that she was progressing.  Unfortunately, we do not have any actionable mutations that we could try to target.  She is doing well on the letrozole/Afinitor combination.  As such, I think that it be worthwhile keeping her on this right now.  I am glad that the radiation therapy has helped her.  We now to deal with his blood clot in the left leg.  Again, we will have to see if IR can do a thrombectomy on her.  We will switch her over to Arixtra.  I think this would be a very reasonable way of trying to treat this.  We will keep her appointment as scheduled in early September.  She will let us know if there are any issues.  She still wants to do what she can and maintain a good quality of life.   [name], MD 8/18/20231:09 PM

## 2022-03-12 NOTE — Progress Notes (Incomplete)
  Radiation Oncology         [phone] ________________________________  Patient Name: Jaime Gomez MRN: [id number] DOB: [birth year] Referring Physician: Burney Gauze (Profile Not Attached) Date of Service: 02/09/2022 [facility], [place]                                                        End Of Treatment Note  Diagnoses: C54.1-Malignant neoplasm of endometrium C79.51-Secondary malignant neoplasm of bone  Cancer Staging: The primary encounter diagnosis was Endometrial cancer, FIGO stage IVB ([place]). A diagnosis of Cancer, metastatic to bone [facility]) was also pertinent to this visit.   Metastatic endometrial cancer: initially diagnosed in 2012 with 2 synchronous primaries of stage IC adenocarcinoma of the ovary and stage IIIC2 moderately differentiated endometrioid adenocarcinoma of the uterus              - Recent PET showed pulmonary metastases and a new tumor involving the lower sacrum    Cancer Staging  Endometrial cancer, FIGO stage IVB ([place]) Staging form: Corpus Uteri - Carcinoma and Carcinosarcoma, AJCC 8th Edition - Clinical stage from 08/18/2021: FIGO Stage IVB (rcT3a, cNX, pM1) - Signed by [name], MD on 08/18/2021  Intent: Palliative  Radiation Treatment Dates: 01/26/2022 through 02/09/2022 Site Technique Total Dose (Gy) Dose per Fx (Gy) Completed Fx Beam Energies  Lumbar Spine: Spine_Sacral IMRT 30/30 3 10/10 6X   Narrative: The patient tolerated radiation therapy relatively well. During her final weekly treatment check on 02/07/22, the patient reported lower back pain rated at a 4/10 due to increased physical activity, and fatigue. Overall, her lower back/sacral pain improved nicely with treatment.   Plan: The patient will follow-up with radiation oncology in one month .  ________________________________________________ -----------------------------------  Blair Promise, PhD, MD  This document serves as a record of services personally  performed by Gery Pray, MD. It was created on his behalf by Roney Mans, a trained medical scribe. The creation of this record is based on the scribe's personal observations and the provider's statements to them. This document has been checked and approved by the attending provider.

## 2022-03-12 NOTE — Progress Notes (Signed)
Radiation Oncology         [phone] ________________________________  Name: Jaime Gomez MRN: [id number]  Date: 03/13/2022  DOB: 1966-03-16  Follow-Up Visit Note  CC: Wall, [name], FNP  [name], MD  No diagnosis found.  Diagnosis: The primary encounter diagnosis was Endometrial cancer, FIGO stage IVB ([place]). A diagnosis of Cancer, metastatic to bone [facility]) was also pertinent to this visit.   Metastatic endometrial cancer: initially diagnosed in 2012 with 2 synchronous primaries of stage IC adenocarcinoma of the ovary and stage IIIC2 moderately differentiated endometrioid adenocarcinoma of the uterus              - Recent PET showed pulmonary metastases and a new tumor involving the lower sacrum    Cancer Staging  Endometrial cancer, FIGO stage IVB ([place]) Staging form: Corpus Uteri - Carcinoma and Carcinosarcoma, AJCC 8th Edition - Clinical stage from 08/18/2021: FIGO Stage IVB (rcT3a, cNX, pM1) - Signed by [name], MD on 08/18/2021  Interval Since Last Radiation: 1 month and 1 day  Intent: Palliative  Radiation Treatment Dates: 01/26/2022 through 02/09/2022 Site Technique Total Dose (Gy) Dose per Fx (Gy) Completed Fx Beam Energies  Lumbar Spine: Spine_Sacral IMRT 30/30 3 10/10 6X    Narrative:  The patient returns today for routine follow-up. The patient tolerated radiation therapy relatively well. During her final weekly treatment check on 02/07/22, the patient reported lower back pain rated at a 4/10 due to a recent increase in physical activity, and fatigue. Overall, her lower back/sacral pain improved nicely with treatment.  Since her initial consultation date of 01/05/22, the patient has continued on Afinitor/letrozole combination which she is tolerating well. However, during a follow up visit with Dr. Marin Olp on 03/03/22, the patient presented to left leg swelling. For which, Dr. Marin Olp increased her eliquis to 5 mg twice daily. Left LE doppler ultrasound  performed on 03/10/22 unfortunately revealed DVT in the left common femoral, superficial femoral, and popliteal veins.        Given findings of extensive thrombus, Dr. Marin Olp would like to see if IR can do some kind of thrombectomy. Dr. Marin Olp also switched out her Eliquis for Arixtra.      In the interval, the patient also underwent left nephrostomy tube exchange on 01/20/22.  Left ureteral stent placement was also attempted at this time, though documentation does not indicate if this was successful.                ***      Allergies:  is allergic to taxotere [docetaxel].  Meds: Current Outpatient Medications  Medication Sig Dispense Refill   atorvastatin (LIPITOR) 10 MG tablet Take 10 mg by mouth daily at 6 (six) AM.     carvedilol (COREG) 25 MG tablet Take 25 mg by mouth daily at 6 (six) AM.     everolimus (AFINITOR) 10 MG tablet Take 10 mg by mouth daily at 6 (six) AM.     folic acid (FOLVITE) 1 MG tablet Take 1 mg by mouth daily at 6 (six) AM.     fondaparinux (ARIXTRA) 7.5 MG/0.6ML SOLN injection Inject 0.6 mLs (7.5 mg total) into the skin daily. 18 mL 6   gabapentin (NEURONTIN) 100 MG capsule Take 100 mg by mouth 2 (two) times daily.     insulin aspart protamine - aspart (NOVOLOG MIX 70/30 FLEXPEN) (70-30) 100 UNIT/ML FlexPen Inject 20 Units into the skin as directed. Per meal     insulin detemir (LEVEMIR) 100  UNIT/ML injection Inject 20 Units into the skin 2 (two) times daily.     letrozole (FEMARA) 2.5 MG tablet Take 2.5 mg by mouth daily at 6 (six) AM.     mirabegron ER (MYRBETRIQ) 25 MG TB24 tablet Take 1 tablet (25 mg total) by mouth daily for 360 doses. 30 tablet 11   nitrofurantoin, macrocrystal-monohydrate, (MACROBID) 100 MG capsule Take 1 capsule (100 mg total) by mouth 2 (two) times daily. 180 capsule 6   ondansetron (ZOFRAN) 8 MG tablet Take 8 mg by mouth every 8 (eight) hours as needed.     oxycodone (OXY-IR) 5 MG capsule Take 1 capsule (5 mg total) by mouth every 4  (four) hours as needed. 120 capsule 0   pantoprazole (PROTONIX) 40 MG tablet Take 40 mg by mouth in the morning.     polyethylene glycol (MIRALAX / GLYCOLAX) 17 g packet Take 17 g by mouth daily as needed for mild constipation. 14 each 0   pravastatin (PRAVACHOL) 80 MG tablet Take 80 mg by mouth daily at 6 (six) AM.     prochlorperazine (COMPAZINE) 10 MG tablet Take 10 mg by mouth every 6 (six) hours.     No current facility-administered medications for this encounter.    Physical Findings: The patient is in no acute distress. Patient is alert and oriented.  vitals were not taken for this visit. .  No significant changes. Lungs are clear to auscultation bilaterally. Heart has regular rate and rhythm. No palpable cervical, supraclavicular, or axillary adenopathy. Abdomen soft, non-tender, normal bowel sounds.   Lab Findings: Lab Results  Component Value Date   WBC 3.5 (L) 03/03/2022   HGB 8.0 (L) 03/03/2022   HCT 25.0 (L) 03/03/2022   MCV 85.6 03/03/2022   PLT 220 03/03/2022    Radiographic Findings: US Venous Img Lower Unilateral Left (DVT)  Result Date: 03/10/2022 CLINICAL DATA:  History of right lower extremity deep venous thrombosis. EXAM: Left LOWER EXTREMITY VENOUS DOPPLER ULTRASOUND TECHNIQUE: Gray-scale sonography with graded compression, as well as color Doppler and duplex ultrasound were performed to evaluate the lower extremity deep venous systems from the level of the common femoral vein and including the common femoral, femoral, profunda femoral, popliteal and calf veins including the posterior tibial, peroneal and gastrocnemius veins when visible. The superficial great saphenous vein was also interrogated. Spectral Doppler was utilized to evaluate flow at rest and with distal augmentation maneuvers in the common femoral, femoral and popliteal veins. COMPARISON:  None available currently. FINDINGS: Contralateral Common Femoral Vein: Respiratory phasicity is normal and symmetric  with the symptomatic side. No evidence of thrombus. Normal compressibility. Common Femoral Vein: Only partially compressible consistent with nonocclusive thrombus. Saphenofemoral Junction: No evidence of thrombus. Normal compressibility and flow on color Doppler imaging. Profunda Femoral Vein: No evidence of thrombus. Normal compressibility and flow on color Doppler imaging. Femoral Vein: Noncompressible consistent with occlusive thrombus. Popliteal Vein: Noncompressible consistent with occlusive thrombus. Calf Veins: No evidence of thrombus. Normal compressibility and flow on color Doppler imaging. Peroneal veins are not visualized. Superficial Great Saphenous Vein: No evidence of thrombus. Normal compressibility. Venous Reflux:  None. Other Findings:  None. IMPRESSION: Deep venous thrombosis is noted in the left common femoral, superficial femoral and popliteal veins. These results will be called to the ordering clinician or representative by the Radiologist Assistant, and communication documented in the PACS or zVision Dashboard. Electronically Signed   By: Marijo Conception M.D.   On: 03/10/2022 11:41    Impression:  The primary encounter diagnosis was Endometrial cancer, FIGO stage IVB ([place]). A diagnosis of Cancer, metastatic to bone [facility]) was also pertinent to this visit.   Metastatic endometrial cancer: initially diagnosed in 2012 with 2 synchronous primaries of stage IC adenocarcinoma of the ovary and stage IIIC2 moderately differentiated endometrioid adenocarcinoma of the uterus              - Recent PET showed pulmonary metastases and a new tumor involving the lower sacrum     The patient is recovering from the effects of radiation.  ***  Plan:  ***   *** minutes of total time was spent for this patient encounter, including preparation, face-to-face counseling with the patient and coordination of care, physical exam, and documentation of the  encounter. ____________________________________  Blair Promise, PhD, MD  This document serves as a record of services personally performed by Gery Pray, MD. It was created on his behalf by Roney Mans, a trained medical scribe. The creation of this record is based on the scribe's personal observations and the provider's statements to them. This document has been checked and approved by the attending provider.

## 2022-03-13 ENCOUNTER — Ambulatory Visit
Admission: RE | Admit: 2022-03-13 | Discharge: 2022-03-13 | Disposition: A | Discharge status: Home / Self Care | Payer: BC Managed Care – PPO | Source: Ambulatory Visit | Attending: Radiation Oncology | Admitting: Radiation Oncology

## 2022-03-13 ENCOUNTER — Other Ambulatory Visit: Payer: Self-pay

## 2022-03-13 ENCOUNTER — Encounter: Payer: Self-pay | Admitting: Radiation Oncology

## 2022-03-13 DIAGNOSIS — C541 Malignant neoplasm of endometrium: Secondary | ICD-10-CM | POA: Diagnosis present

## 2022-03-13 NOTE — Progress Notes (Signed)
Jaime Gomez is here today for follow up post radiation to the Lumbar Spine   They completed their radiation on: 02/09/22  Does the patient complain of any of the following: Post radiation skin issues: No Pain: Patient reports pain to left leg, rating 5/10. Pain is relieved with Tylenol. Patient's eliqus was recently increased to '5mg'$  BID.  Fatigue post radiation: No, patient states she feels much better.  Appetite good/fair/poor: Good.   Additional comments if applicable:   BP (!) 292/90 (BP Location: Left Arm, Patient Position: Sitting, Cuff Size: Normal)   Pulse 98   Temp 97.7 F (36.5 C)   Resp 18   Ht '5\' 5"'$  (1.651 m)   Wt 158 lb 6.4 oz (71.8 kg)   SpO2 100%   BMI 26.36 kg/m

## 2022-03-30 ENCOUNTER — Other Ambulatory Visit: Payer: Self-pay | Admitting: Urology

## 2022-04-03 ENCOUNTER — Inpatient Hospital Stay: Payer: BC Managed Care – PPO

## 2022-04-03 ENCOUNTER — Telehealth: Payer: Self-pay | Admitting: *Deleted

## 2022-04-03 ENCOUNTER — Ambulatory Visit: Payer: BC Managed Care – PPO | Admitting: Hematology & Oncology

## 2022-04-03 ENCOUNTER — Inpatient Hospital Stay (HOSPITAL_BASED_OUTPATIENT_CLINIC_OR_DEPARTMENT_OTHER): Payer: BC Managed Care – PPO | Admitting: Hematology & Oncology

## 2022-04-03 ENCOUNTER — Inpatient Hospital Stay: Payer: BC Managed Care – PPO | Attending: Hematology & Oncology

## 2022-04-03 ENCOUNTER — Other Ambulatory Visit: Payer: Self-pay | Admitting: *Deleted

## 2022-04-03 ENCOUNTER — Other Ambulatory Visit: Payer: BC Managed Care – PPO

## 2022-04-03 VITALS — BP 133/79 | HR 78 | Temp 98.2°F | Resp 17 | Wt 154.0 lb

## 2022-04-03 DIAGNOSIS — Z7901 Long term (current) use of anticoagulants: Secondary | ICD-10-CM | POA: Insufficient documentation

## 2022-04-03 DIAGNOSIS — D631 Anemia in chronic kidney disease: Secondary | ICD-10-CM

## 2022-04-03 DIAGNOSIS — C541 Malignant neoplasm of endometrium: Secondary | ICD-10-CM | POA: Diagnosis present

## 2022-04-03 DIAGNOSIS — N189 Chronic kidney disease, unspecified: Secondary | ICD-10-CM | POA: Insufficient documentation

## 2022-04-03 DIAGNOSIS — Z79811 Long term (current) use of aromatase inhibitors: Secondary | ICD-10-CM | POA: Diagnosis not present

## 2022-04-03 DIAGNOSIS — I82501 Chronic embolism and thrombosis of unspecified deep veins of right lower extremity: Secondary | ICD-10-CM

## 2022-04-03 DIAGNOSIS — I82402 Acute embolism and thrombosis of unspecified deep veins of left lower extremity: Secondary | ICD-10-CM | POA: Insufficient documentation

## 2022-04-03 DIAGNOSIS — Z95828 Presence of other vascular implants and grafts: Secondary | ICD-10-CM

## 2022-04-03 LAB — RETICULOCYTES
Immature Retic Fract: 14.4 % (ref 2.3–15.9)
RBC.: 1.92 MIL/uL — ABNORMAL LOW (ref 3.87–5.11)
Retic Count, Absolute: 71.2 10*3/uL (ref 19.0–186.0)
Retic Ct Pct: 3.7 % — ABNORMAL HIGH (ref 0.4–3.1)

## 2022-04-03 LAB — CBC WITH DIFFERENTIAL (CANCER CENTER ONLY)
Abs Immature Granulocytes: 0.02 10*3/uL (ref 0.00–0.07)
Basophils Absolute: 0 10*3/uL (ref 0.0–0.1)
Basophils Relative: 2 %
Eosinophils Absolute: 0 10*3/uL (ref 0.0–0.5)
Eosinophils Relative: 2 %
HCT: 16.8 % — ABNORMAL LOW (ref 36.0–46.0)
Hemoglobin: 5.2 g/dL — CL (ref 12.0–15.0)
Immature Granulocytes: 1 %
Lymphocytes Relative: 28 %
Lymphs Abs: 0.6 10*3/uL — ABNORMAL LOW (ref 0.7–4.0)
MCH: 26.8 pg (ref 26.0–34.0)
MCHC: 31 g/dL (ref 30.0–36.0)
MCV: 86.6 fL (ref 80.0–100.0)
Monocytes Absolute: 0.3 10*3/uL (ref 0.1–1.0)
Monocytes Relative: 13 %
Neutro Abs: 1.2 10*3/uL — ABNORMAL LOW (ref 1.7–7.7)
Neutrophils Relative %: 54 %
Platelet Count: 170 10*3/uL (ref 150–400)
RBC: 1.94 MIL/uL — ABNORMAL LOW (ref 3.87–5.11)
RDW: 16.7 % — ABNORMAL HIGH (ref 11.5–15.5)
WBC Count: 2.2 10*3/uL — ABNORMAL LOW (ref 4.0–10.5)
nRBC: 0 % (ref 0.0–0.2)

## 2022-04-03 LAB — CMP (CANCER CENTER ONLY)
ALT: <5 U/L (ref 0–44)
AST: 8 U/L — ABNORMAL LOW (ref 15–41)
Albumin: 3.5 g/dL (ref 3.5–5.0)
Alkaline Phosphatase: 106 U/L (ref 38–126)
Anion gap: 11 (ref 5–15)
BUN: 44 mg/dL — ABNORMAL HIGH (ref 6–20)
CO2: 17 mmol/L — ABNORMAL LOW (ref 22–32)
Calcium: 8.7 mg/dL — ABNORMAL LOW (ref 8.9–10.3)
Chloride: 106 mmol/L (ref 98–111)
Creatinine: 3.13 mg/dL (ref 0.44–1.00)
GFR, Estimated: 17 mL/min — ABNORMAL LOW (ref >60)
Glucose, Bld: 313 mg/dL — ABNORMAL HIGH (ref 70–99)
Potassium: 3.2 mmol/L — ABNORMAL LOW (ref 3.5–5.1)
Sodium: 134 mmol/L — ABNORMAL LOW (ref 135–145)
Total Bilirubin: 0.3 mg/dL (ref 0.3–1.2)
Total Protein: 6.7 g/dL (ref 6.5–8.1)

## 2022-04-03 LAB — FERRITIN: Ferritin: 1461 ng/mL — ABNORMAL HIGH (ref 11–307)

## 2022-04-03 MED ORDER — SODIUM CHLORIDE 0.9% FLUSH
10.0000 mL | Freq: Once | INTRAVENOUS | Status: AC
  Administered 2022-04-03: 10 mL via INTRAVENOUS

## 2022-04-03 MED ORDER — HEPARIN SOD (PORK) LOCK FLUSH 100 UNIT/ML IV SOLN
500.0000 [IU] | Freq: Once | INTRAVENOUS | Status: AC
  Administered 2022-04-03: 500 [IU] via INTRAVENOUS

## 2022-04-03 NOTE — Patient Instructions (Signed)

## 2022-04-03 NOTE — Telephone Encounter (Signed)
Critical Hgb 5.2 and Critical Creatinine of 3.3 called by Judeen Hammans in lab.  Reported to Dr Marin Olp Orders received for blood transfusion

## 2022-04-04 ENCOUNTER — Inpatient Hospital Stay: Payer: BC Managed Care – PPO

## 2022-04-04 ENCOUNTER — Other Ambulatory Visit: Payer: Self-pay

## 2022-04-04 DIAGNOSIS — D631 Anemia in chronic kidney disease: Secondary | ICD-10-CM

## 2022-04-04 DIAGNOSIS — C541 Malignant neoplasm of endometrium: Secondary | ICD-10-CM | POA: Diagnosis not present

## 2022-04-04 LAB — BPAM RBC
Blood Product Expiration Date: 202310042359
Blood Product Expiration Date: 202310042359
Unit Type and Rh: 6200
Unit Type and Rh: 6200

## 2022-04-04 LAB — CA 125: Cancer Antigen (CA) 125: 207 U/mL — ABNORMAL HIGH (ref 0.0–38.1)

## 2022-04-04 LAB — TYPE AND SCREEN
ABO/RH(D): A POS
Antibody Screen: POSITIVE
DAT, IgG: POSITIVE
Donor AG Type: NEGATIVE
Donor AG Type: NEGATIVE
Unit division: 0
Unit division: 0

## 2022-04-04 LAB — IRON AND IRON BINDING CAPACITY (CC-WL,HP ONLY)
Iron: 52 ug/dL (ref 28–170)
Saturation Ratios: 25 % (ref 10.4–31.8)
TIBC: 211 ug/dL — ABNORMAL LOW (ref 250–450)
UIBC: 159 ug/dL (ref 148–442)

## 2022-04-04 NOTE — Patient Instructions (Addendum)
SURGICAL WAITING ROOM VISITATION Patients having surgery or a procedure may have no more than 2 support people in the waiting area - these visitors may rotate.   Children under the age of 32 must have an adult with them who is not the patient. If the patient needs to stay at the hospital during part of their recovery, the visitor guidelines for inpatient rooms apply. Pre-op nurse will coordinate an appropriate time for 1 support person to accompany patient in pre-op.  This support person may not rotate.    Please refer to the [facility] website for the visitor guidelines for Inpatients (after your surgery is over and you are in a regular room).      Your procedure is scheduled on: 04-10-22   Report to [facility] Main Entrance    Report to admitting at 12:30 PM   Call this number if you have problems the morning of surgery [phone]   Do not eat food :After Midnight.   After Midnight you may have the following liquids until 11:45 AM DAY OF SURGERY  Water Non-Citrus Juices (without pulp, NO RED) Carbonated Beverages Black Coffee (NO MILK/CREAM OR CREAMERS, sugar ok)  Clear Tea (NO MILK/CREAM OR CREAMERS, sugar ok) regular and decaf                             Plain Jell-O (NO RED)                                           Fruit ices (not with fruit pulp, NO RED)                                     Popsicles (NO RED)                                                               Sports drinks like Gatorade (NO RED)                      If you have questions, please contact your surgeon's office.   FOLLOW  ANY ADDITIONAL PRE OP INSTRUCTIONS YOU RECEIVED FROM YOUR SURGEON'S OFFICE!!!     Oral Hygiene is also important to reduce your risk of infection.                                    Remember - BRUSH YOUR TEETH THE MORNING OF SURGERY WITH YOUR REGULAR TOOTHPASTE   Do NOT smoke after Midnight  Take these medicines the morning of surgery with A SIP OF WATER:   Atorvastatin, Carvedilol, Gabapentin, Mirabegron, Pantroprazole, Pravastatin  How to Manage Your Diabetes Before and After Surgery  Why is it important to control my blood sugar before and after surgery? Improving blood sugar levels before and after surgery helps healing and can limit problems. A way of improving blood sugar control is eating a healthy diet by:  Eating less sugar and carbohydrates  Increasing activity/exercise  Talking with your doctor about reaching your blood sugar goals High blood sugars (greater than 180 mg/dL) can raise your risk of infections and slow your recovery, so you will need to focus on controlling your diabetes during the weeks before surgery. Make sure that the doctor who takes care of your diabetes knows about your planned surgery including the date and location.  How do I manage my blood sugar before surgery? Check your blood sugar at least 4 times a day, starting 2 days before surgery, to make sure that the level is not too high or low. Check your blood sugar the morning of your surgery when you wake up and every 2 hours until you get to the Short Stay unit. If your blood sugar is less than 70 mg/dL, you will need to treat for low blood sugar: Do not take insulin. Treat a low blood sugar (less than 70 mg/dL) with  cup of clear juice (cranberry or apple), 4 glucose tablets, OR glucose gel. Recheck blood sugar in 15 minutes after treatment (to make sure it is greater than 70 mg/dL). If your blood sugar is not greater than 70 mg/dL on recheck, call [phone] for further instructions. Report your blood sugar to the short stay nurse when you get to Short Stay.  If you are admitted to the hospital after surgery: Your blood sugar will be checked by the staff and you will probably be given insulin after surgery (instead of oral diabetes medicines) to make sure you have good blood sugar levels. The goal for blood sugar control after surgery is 80-180  mg/dL.   WHAT DO I DO ABOUT MY DIABETES MEDICATION?  Do not take oral diabetes medicines (pills) the morning of surgery.  THE NIGHT BEFORE SURGERY:  Novolog 70/30 inject 14 units evening dose..                         Insulin Detemir inject 10 units evening dose    THE MORNING OF SURGERY: Do not take Novolog 70/30                                    Insulin Detemir inject 10 units.   Reviewed and Endorsed by [facility] Patient Education Committee, August 2015    Bring CPAP mask and tubing day of surgery.                              You may not have any metal on your body including hair pins, jewelry, and body piercing             Do not wear make-up, lotions, powders, perfumes or deodorant  Do not wear nail polish including gel and S&S, artificial/acrylic nails, or any other type of covering on natural nails including finger and toenails. If you have artificial nails, gel coating, etc. that needs to be removed by a nail salon please have this removed prior to surgery or surgery may need to be canceled/ delayed if the surgeon/ anesthesia feels like they are unable to be safely monitored.   Do not shave  48 hours prior to surgery.    Do not bring valuables to the hospital. [place].   Contacts, dentures or bridgework may not be worn into surgery.  DO NOT [facility]  MEDICATIONS TO [place]. PHARMACY WILL DISPENSE MEDICATIONS LISTED ON YOUR MEDICATION LIST TO YOU DURING YOUR ADMISSION [place]!   Patients discharged on the day of surgery will not be allowed to drive home.  Someone NEEDS to stay with you for the first 24 hours after anesthesia.  Special Instructions: Bring a copy of your healthcare power of attorney and living will documents the day of surgery if you haven't scanned them before.  Please read over the following fact sheets you were given: IF YOU HAVE QUESTIONS ABOUT YOUR PRE-OP INSTRUCTIONS PLEASE CALL [place] - Preparing for Surgery Before surgery, you can play an important role.  Because skin is not sterile, your skin needs to be as free of germs as possible.  You can reduce the number of germs on your skin by washing with CHG (chlorahexidine gluconate) soap before surgery.  CHG is an antiseptic cleaner which kills germs and bonds with the skin to continue killing germs even after washing. Please DO NOT use if you have an allergy to CHG or antibacterial soaps.  If your skin becomes reddened/irritated stop using the CHG and inform your nurse when you arrive at Short Stay. Do not shave (including legs and underarms) for at least 48 hours prior to the first CHG shower.  You may shave your face/neck.  Please follow these instructions carefully:  1.  Shower with CHG Soap the night before surgery and the  morning of surgery.  2.  If you choose to wash your hair, wash your hair first as usual with your normal  shampoo.  3.  After you shampoo, rinse your hair and body thoroughly to remove the shampoo.                             4.  Use CHG as you would any other liquid soap.  You can apply chg directly to the skin and wash.  Gently with a scrungie or clean washcloth.  5.  Apply the CHG Soap to your body ONLY FROM THE NECK DOWN.   Do   not use on face/ open                           Wound or open sores. Avoid contact with eyes, ears mouth and   genitals (private parts).                       Wash face,  Genitals (private parts) with your normal soap.             6.  Wash thoroughly, paying special attention to the area where your    surgery  will be performed.  7.  Thoroughly rinse your body with warm water from the neck down.  8.  DO NOT shower/wash with your normal soap after using and rinsing off the CHG Soap.                9.  Pat yourself dry with a clean towel.            10.  Wear clean pajamas.            11.  Place clean sheets on your bed the night of your first shower and do not   sleep with pets. Day of Surgery : Do not apply any lotions/deodorants the morning of surgery.  Please wear clean clothes to the hospital/surgery center.  FAILURE TO FOLLOW THESE INSTRUCTIONS MAY RESULT IN THE CANCELLATION OF YOUR SURGERY  PATIENT SIGNATURE_________________________________  NURSE SIGNATURE__________________________________  ________________________________________________________________________

## 2022-04-04 NOTE — Progress Notes (Signed)
COVID Vaccine Completed:  Date of COVID positive in last 90 days:  PCP - Sandi Raveling, FNP Cardiologist -   Chest x-ray - 09-01-21 Epic EKG - 09-01-21 Epic Stress Test -  ECHO -  Cardiac Cath -  Pacemaker/ICD device last checked: Spinal Cord Stimulator:  Bowel Prep -   Sleep Study -  CPAP -   Fasting Blood Sugar -  Checks Blood Sugar _____ times a day  Blood Thinner Instructions: Eliquis for DVT Aspirin Instructions: Last Dose:  Activity level:  Can go up a flight of stairs and perform activities of daily living without stopping and without symptoms of chest pain or shortness of breath.  Able to exercise without symptoms  Unable to go up a flight of stairs without symptoms of     Anesthesia review:   Patient denies shortness of breath, fever, cough and chest pain at PAT appointment  Patient verbalized understanding of instructions that were given to them at the PAT appointment. Patient was also instructed that they will need to review over the PAT instructions again at home before surgery.

## 2022-04-04 NOTE — Progress Notes (Signed)
Blood bank called asking for a new sample for pt, called patient and she will come in today for blood draw. Orders placed.

## 2022-04-04 NOTE — Progress Notes (Signed)
Hematology and Oncology Follow Up Visit  Jaime Gomez [id number] 11-14-1965 56 y.o. 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 twice daily --increase to 5 mg p.o. twice daily on 03/03/2022 -- d/c on 03/13/2022 Arixtra 7.5 mg sq q day --  start on 03/13/2022     Interim History:  Jaime Gomez is back for follow-up.  She is tired.  I can understand this given that her hemoglobin is only 5.3.  She is not obvious bleeding.  She is on Arixtra because of a new thrombus in the left leg.  The left leg is still somewhat swollen.  I think some of the swelling might be from her marked anemia.  She is going to have I think the stents exchanged with respect to her ureters.  She is not noted any hematuria, hematochezia or melena.  She is on the Afinitor/letrozole.  She seems to be doing okay with this.  Her last CA have 125 was actually down a little bit at 207.  She seems to be eating okay.  She has had no nausea or vomiting.  That she has had no headache.  Overall, I would have said that her performance status is probably ECOG 1-2.      Medications:  Current Outpatient Medications:    acetaminophen (TYLENOL) 500 MG tablet, Take 500 mg by mouth every 6 (six) hours as needed., Disp: , Rfl:    apixaban (ELIQUIS) 5 MG TABS tablet, Take 5 mg by mouth 2 (two) times daily., Disp: , Rfl:    atorvastatin (LIPITOR) 10 MG tablet, Take 10 mg by mouth daily at 6 (six) AM., Disp: , Rfl:    carvedilol (COREG) 25 MG tablet, Take 25 mg by mouth daily at 6 (six) AM., Disp: , Rfl:    everolimus (AFINITOR) 10 MG tablet, Take 10 mg by mouth daily at 6 (six) AM., Disp: , Rfl:    folic acid (FOLVITE) 1 MG tablet, Take 1 mg by mouth daily at 6 (six) AM., Disp: , Rfl:    fondaparinux (ARIXTRA) 7.5 MG/0.6ML SOLN  injection, Inject 0.6 mLs (7.5 mg total) into the skin daily., Disp: 18 mL, Rfl: 6   gabapentin (NEURONTIN) 100 MG capsule, Take 100 mg by mouth 2 (two) times daily., Disp: , Rfl:    insulin aspart protamine - aspart (NOVOLOG MIX 70/30 FLEXPEN) (70-30) 100 UNIT/ML FlexPen, Inject 20 Units into the skin as directed. Per meal, Disp: , Rfl:    insulin detemir (LEVEMIR) 100 UNIT/ML injection, Inject 20 Units into the skin 2 (two) times daily., Disp: , Rfl:    letrozole (FEMARA) 2.5 MG tablet, Take 2.5 mg by mouth daily at 6 (six) AM., Disp: , Rfl:    mirabegron ER (MYRBETRIQ) 25 MG TB24 tablet, Take 1 tablet (25 mg total) by mouth daily for 360 doses., Disp: 30 tablet, Rfl: 11   nitrofurantoin, macrocrystal-monohydrate, (MACROBID) 100 MG capsule, Take 1 capsule (100 mg total) by mouth 2 (two) times daily., Disp: 180 capsule, Rfl: 6   ondansetron (ZOFRAN) 8 MG tablet, Take 8 mg by mouth every 8 (eight) hours as needed., Disp: , Rfl:    oxycodone (OXY-IR) 5 MG capsule, Take 1 capsule (5 mg total) by mouth every 4 (four) hours as needed., Disp: 120 capsule, Rfl: 0   pantoprazole (PROTONIX) 40 MG tablet, Take 40 mg by  mouth in the morning., Disp: , Rfl:    polyethylene glycol (MIRALAX / GLYCOLAX) 17 g packet, Take 17 g by mouth daily as needed for mild constipation., Disp: 14 each, Rfl: 0   pravastatin (PRAVACHOL) 80 MG tablet, Take 80 mg by mouth daily at 6 (six) AM., Disp: , Rfl:    prochlorperazine (COMPAZINE) 10 MG tablet, Take 10 mg by mouth every 6 (six) hours., Disp: , Rfl:   Allergies:  Allergies  Allergen Reactions   Taxotere [Docetaxel]     Throat swoll    Past Medical History, Surgical history, Social history, and Family History were reviewed and updated.  Review of Systems: Review of Systems  Constitutional:  Positive for fatigue.  HENT:  Negative.    Eyes: Negative.   Respiratory: Negative.    Cardiovascular:  Positive for leg swelling.  Gastrointestinal:  Positive for abdominal  pain.  Genitourinary:  Positive for pelvic pain.   Musculoskeletal: Negative.   Skin: Negative.   Neurological: Negative.   Hematological: Negative.   Psychiatric/Behavioral: Negative.      Physical Exam:  vitals were not taken for this visit.   Wt Readings from Last 3 Encounters:  04/03/22 154 lb (69.9 kg)  03/13/22 158 lb 6.4 oz (71.8 kg)  03/10/22 152 lb (68.9 kg)    Physical Exam Vitals reviewed.  HENT:     Head: Normocephalic and atraumatic.  Eyes:     Pupils: Pupils are equal, round, and reactive to light.  Cardiovascular:     Rate and Rhythm: Normal rate and regular rhythm.     Heart sounds: Normal heart sounds.  Pulmonary:     Effort: Pulmonary effort is normal.     Breath sounds: Normal breath sounds.  Abdominal:     General: Bowel sounds are normal.     Palpations: Abdomen is soft.  Musculoskeletal:        General: No tenderness or deformity. Normal range of motion.     Cervical back: Normal range of motion.  Lymphadenopathy:     Cervical: No cervical adenopathy.  Skin:    General: Skin is warm and dry.     Findings: No erythema or rash.  Neurological:     Mental Status: She is alert and oriented to person, place, and time.  Psychiatric:        Behavior: Behavior normal.        Thought Content: Thought content normal.        Judgment: Judgment normal.      Lab Results  Component Value Date   WBC 2.2 (L) 04/03/2022   HGB 5.2 (LL) 04/03/2022   HCT 16.8 (L) 04/03/2022   MCV 86.6 04/03/2022   PLT 170 04/03/2022     Chemistry      Component Value Date/Time   NA 134 (L) 04/03/2022 1502   K 3.2 (L) 04/03/2022 1502   CL 106 04/03/2022 1502   CO2 17 (L) 04/03/2022 1502   BUN 44 (H) 04/03/2022 1502   CREATININE 3.13 (HH) 04/03/2022 1502      Component Value Date/Time   CALCIUM 8.7 (L) 04/03/2022 1502   ALKPHOS 106 04/03/2022 1502   AST 8 (L) 04/03/2022 1502   ALT <5 04/03/2022 1502   BILITOT 0.3 04/03/2022 1502      Impression and  Plan: Jaime Gomez is a very charming 56 year old white female.  She has metastatic endometrial cancer.  She is on Afinitor/letrozole.  The PET scan that she had done back in May shows  that she was progressing. Unfortunately, we do not have any actionable mutations that we could try to target.  She is doing well on the letrozole/Afinitor combination.  As such, I think that it be worthwhile keeping her on this right now.  I think that as long as the CA-125 is coming down or holding steady, we do not have to make a change.  She clearly is going to need to have a blood transfusion.  We will try to get this set up for Wednesday.  I think with a blood transfusion, the leg swelling will improve.  She will continue on the Arixtra.  I think that she does have an IVC filter in place.  I will know if this might be a factor with the thromboembolic disease.  I think trying to get this out might be somewhat tenuous.  We will have her come back on Wednesday for her blood transfusion.  I would like to see her back myself probably about 3 weeks or so.   [name], MD 9/12/20231:26 PM

## 2022-04-05 ENCOUNTER — Inpatient Hospital Stay: Payer: BC Managed Care – PPO

## 2022-04-05 DIAGNOSIS — C541 Malignant neoplasm of endometrium: Secondary | ICD-10-CM | POA: Diagnosis not present

## 2022-04-05 DIAGNOSIS — D631 Anemia in chronic kidney disease: Secondary | ICD-10-CM

## 2022-04-05 MED ORDER — SODIUM CHLORIDE 0.9% FLUSH
10.0000 mL | INTRAVENOUS | Status: AC | PRN
  Administered 2022-04-05: 10 mL

## 2022-04-05 MED ORDER — FUROSEMIDE 10 MG/ML IJ SOLN
40.0000 mg | Freq: Once | INTRAMUSCULAR | Status: DC
  Filled 2022-04-05: qty 4

## 2022-04-05 MED ORDER — HEPARIN SOD (PORK) LOCK FLUSH 100 UNIT/ML IV SOLN
500.0000 [IU] | Freq: Every day | INTRAVENOUS | Status: AC | PRN
  Administered 2022-04-05: 500 [IU]

## 2022-04-05 MED ORDER — ACETAMINOPHEN 325 MG PO TABS: 650.0000 mg | ORAL_TABLET | Freq: Once | ORAL | Status: DC

## 2022-04-05 MED ORDER — FUROSEMIDE 10 MG/ML IJ SOLN: 40.0000 mg | Freq: Once | INTRAMUSCULAR | Status: DC

## 2022-04-05 MED ORDER — DIPHENHYDRAMINE HCL 25 MG PO CAPS
25.0000 mg | ORAL_CAPSULE | Freq: Once | ORAL | Status: AC
  Administered 2022-04-05: 25 mg via ORAL
  Filled 2022-04-05: qty 1

## 2022-04-05 MED ORDER — SODIUM CHLORIDE 0.9% IV SOLUTION
250.0000 mL | Freq: Once | INTRAVENOUS | Status: AC
  Administered 2022-04-05: 250 mL via INTRAVENOUS

## 2022-04-05 NOTE — Patient Instructions (Signed)
Blood Transfusion, Adult A blood transfusion is a procedure in which you receive blood or a type of blood cell (blood component) through an IV. You may need a blood transfusion when you have a low blood count, which is a low number of any blood cell. This may result from a bleeding disorder, illness, injury, or surgery. The blood may come from a donor, or you may be able to have your own blood collected and stored (autologous blood donation) before a planned surgery. The blood given in a transfusion may be made up of different blood components. You may receive: Red blood cells. These carry oxygen to the cells in the body. Platelets. These help your blood to clot. Plasma. This is the liquid part of your blood. It carries proteins and other substances throughout the body. White blood cells. These help you fight infections. If you have hemophilia or another clotting disorder, you may also receive other types of blood products. Depending on the type of blood product, this procedure may take 1-4 hours to complete. Tell a health care provider about: Any bleeding problems you have. Any previous reactions you have had during a blood transfusion. Any allergies you have. All medicines you are taking, including vitamins, herbs, eye drops, creams, and over-the-counter medicines. Any surgeries you have had. Any medical conditions you have. Whether you are pregnant or may be pregnant. What are the risks? Talk with your health care provider about risks. The most common problems include: A mild allergic reaction, such as red, swollen areas of skin (hives) and itching. Fever or chills. This may be the body's response to new blood cells received. This may occur during or up to 4 hours after the transfusion. More serious problems may include: A serious allergic reaction that causes difficulty breathing or swelling around the face and lips. Transfusion-associated circulatory overload (TACO), or too much fluid in  the lungs. This may cause breathing problems. Transfusion-related acute lung injury (TRALI), which causes breathing difficulty and low oxygen in the blood. This can occur within hours of the transfusion or several days later. Iron overload. This can happen after receiving many blood transfusions over a period of time. Infection or virus being transmitted. This is rare because donated blood is carefully tested before it is given. Hemolytic transfusion reaction. This is rare. It happens when the body's defense system (immune system)tries to attack the new blood cells. Symptoms may include fever, chills, nausea, low blood pressure, and low back or chest pain. Transfusion-associated graft-versus-host disease (TAGVHD). This is rare. It happens when donated cells attack the body's healthy tissues. What happens before the procedure? You will have a blood test to check your blood type. This test is done to know what kind of blood your body will accept and to match it to the donor blood. If you are going to have a planned surgery, you may be able to do an autologous blood donation. This may be done in case you need to have a transfusion. You will have your temperature, blood pressure, and pulse checked before the transfusion. If you have had an allergic reaction to a transfusion in the past, you may be given medicine to help prevent a reaction. This medicine may be given to you by mouth (orally) or through an IV. What happens during the procedure?  An IV will be inserted into one of your veins. The bag of blood will be attached to your IV. The blood will then enter through your vein. Your temperature, blood pressure,   and pulse will be monitored during the transfusion. This monitoring is done to detect early signs of a transfusion reaction. Tell your nurse right away if you have any of these symptoms during the transfusion: Shortness of breath or trouble breathing. Chest or back pain. Fever or  chills. Itching or hives. If you have any signs or symptoms of a reaction, your transfusion will be stopped and you may be given medicine. When the transfusion is complete, your IV will be removed. Pressure may be applied to the IV site for a few minutes. A bandage (dressing)will be applied. The procedure may vary among health care providers and hospitals. What happens after the procedure? Your temperature, blood pressure, pulse, breathing rate, and blood oxygen level will be monitored until you leave the hospital or clinic. Your blood may be tested to see how you have responded to the transfusion. You may be warmed with fluids or blankets to maintain a normal body temperature. If you receive your blood transfusion in an outpatient setting, you will be told whom to contact to report any reactions. Where to find more information Visit the [employer]: redcross.org Summary A blood transfusion is a procedure in which you receive blood or a type of blood cell (blood component) through an IV. The blood given in a transfusion may be made up of different blood components. You may receive red blood cells, platelets, plasma, or white blood cells depending on the condition treated. Your temperature, blood pressure, and pulse will be monitored before, during, and after the transfusion. After the transfusion, your blood may be tested to see how your body has responded. This information is not intended to replace advice given to you by your health care provider. Make sure you discuss any questions you have with your health care provider. Document Revised: 10/07/2021 Document Reviewed: 10/07/2021 Elsevier Patient Education  2023 [employer]vier Inc.  

## 2022-04-06 ENCOUNTER — Inpatient Hospital Stay (HOSPITAL_COMMUNITY)
Admission: RE | Admit: 2022-04-06 | Discharge: 2022-04-06 | Disposition: A | Discharge status: Home / Self Care | Payer: BC Managed Care – PPO | Source: Ambulatory Visit

## 2022-04-06 DIAGNOSIS — E119 Type 2 diabetes mellitus without complications: Secondary | ICD-10-CM

## 2022-04-06 DIAGNOSIS — Z794 Long term (current) use of insulin: Secondary | ICD-10-CM

## 2022-04-06 DIAGNOSIS — D649 Anemia, unspecified: Secondary | ICD-10-CM

## 2022-04-06 LAB — TYPE AND SCREEN
ABO/RH(D): A POS
Antibody Screen: POSITIVE
DAT, IgG: POSITIVE
Donor AG Type: NEGATIVE
Donor AG Type: NEGATIVE
Unit division: 0
Unit division: 0

## 2022-04-06 LAB — BPAM RBC
Blood Product Expiration Date: 202310042359
Blood Product Expiration Date: 202310042359
ISSUE DATE / TIME: 202309130745
ISSUE DATE / TIME: 202309130745
Unit Type and Rh: 6200
Unit Type and Rh: 6200

## 2022-04-06 NOTE — Progress Notes (Addendum)
Spoke to patient and she states that she is cancelling surgery for 04-10-22 due to transportation.  She is going to call Dr. Virgina Norfolk office this morning to have surgery rescheduled.  Spoke to [facility] at Dr. Virgina Norfolk office and notified her as well, also informed her that patient currently has a hemoglobin of 5.2.  Coni stated that she would make Dr. Abner Greenspan aware.

## 2022-04-10 ENCOUNTER — Encounter (HOSPITAL_COMMUNITY): Admission: RE | Payer: Self-pay | Source: Home / Self Care

## 2022-04-10 ENCOUNTER — Ambulatory Visit (HOSPITAL_COMMUNITY): Admission: RE | Admit: 2022-04-10 | Payer: BC Managed Care – PPO | Source: Home / Self Care | Admitting: Urology

## 2022-04-10 DIAGNOSIS — Z794 Long term (current) use of insulin: Secondary | ICD-10-CM

## 2022-04-10 DIAGNOSIS — D649 Anemia, unspecified: Secondary | ICD-10-CM

## 2022-04-10 SURGERY — CYSTOSCOPY, FLEXIBLE, WITH STENT REPLACEMENT
Anesthesia: General | Laterality: Right

## 2022-04-11 ENCOUNTER — Other Ambulatory Visit: Payer: Self-pay

## 2022-04-11 DIAGNOSIS — C541 Malignant neoplasm of endometrium: Secondary | ICD-10-CM

## 2022-04-13 ENCOUNTER — Other Ambulatory Visit: Payer: Self-pay | Admitting: Urology

## 2022-04-13 ENCOUNTER — Other Ambulatory Visit: Payer: Self-pay | Admitting: *Deleted

## 2022-04-13 DIAGNOSIS — C541 Malignant neoplasm of endometrium: Secondary | ICD-10-CM

## 2022-04-13 MED ORDER — OXYCODONE HCL 5 MG PO CAPS: 5.0000 mg | ORAL_CAPSULE | ORAL | 0 refills | Status: DC | PRN

## 2022-04-21 ENCOUNTER — Encounter (HOSPITAL_COMMUNITY): Payer: Self-pay

## 2022-04-21 ENCOUNTER — Emergency Department (HOSPITAL_COMMUNITY): Payer: BC Managed Care – PPO

## 2022-04-21 ENCOUNTER — Emergency Department (HOSPITAL_COMMUNITY)
Admission: EM | Admit: 2022-04-21 | Discharge: 2022-04-21 | Disposition: A | Discharge status: Home / Self Care | Payer: BC Managed Care – PPO | Attending: Emergency Medicine | Admitting: Emergency Medicine

## 2022-04-21 ENCOUNTER — Other Ambulatory Visit: Payer: Self-pay

## 2022-04-21 DIAGNOSIS — Y9301 Activity, walking, marching and hiking: Secondary | ICD-10-CM | POA: Insufficient documentation

## 2022-04-21 DIAGNOSIS — I129 Hypertensive chronic kidney disease with stage 1 through stage 4 chronic kidney disease, or unspecified chronic kidney disease: Secondary | ICD-10-CM | POA: Diagnosis not present

## 2022-04-21 DIAGNOSIS — E1122 Type 2 diabetes mellitus with diabetic chronic kidney disease: Secondary | ICD-10-CM | POA: Diagnosis not present

## 2022-04-21 DIAGNOSIS — S4992XA Unspecified injury of left shoulder and upper arm, initial encounter: Secondary | ICD-10-CM | POA: Diagnosis present

## 2022-04-21 DIAGNOSIS — Z8542 Personal history of malignant neoplasm of other parts of uterus: Secondary | ICD-10-CM | POA: Insufficient documentation

## 2022-04-21 DIAGNOSIS — Z794 Long term (current) use of insulin: Secondary | ICD-10-CM | POA: Insufficient documentation

## 2022-04-21 DIAGNOSIS — S42292A Other displaced fracture of upper end of left humerus, initial encounter for closed fracture: Secondary | ICD-10-CM | POA: Insufficient documentation

## 2022-04-21 DIAGNOSIS — N183 Chronic kidney disease, stage 3 unspecified: Secondary | ICD-10-CM | POA: Insufficient documentation

## 2022-04-21 DIAGNOSIS — W010XXA Fall on same level from slipping, tripping and stumbling without subsequent striking against object, initial encounter: Secondary | ICD-10-CM | POA: Diagnosis not present

## 2022-04-21 LAB — BASIC METABOLIC PANEL
Anion gap: 12 (ref 5–15)
BUN: 47 mg/dL — ABNORMAL HIGH (ref 6–20)
CO2: 22 mmol/L (ref 22–32)
Calcium: 9.4 mg/dL (ref 8.9–10.3)
Chloride: 104 mmol/L (ref 98–111)
Creatinine, Ser: 2.88 mg/dL — ABNORMAL HIGH (ref 0.44–1.00)
GFR, Estimated: 18 mL/min — ABNORMAL LOW (ref >60)
Glucose, Bld: 269 mg/dL — ABNORMAL HIGH (ref 70–99)
Potassium: 4.6 mmol/L (ref 3.5–5.1)
Sodium: 138 mmol/L (ref 135–145)

## 2022-04-21 LAB — CBC WITH DIFFERENTIAL/PLATELET
Abs Immature Granulocytes: 0.08 10*3/uL — ABNORMAL HIGH (ref 0.00–0.07)
Basophils Absolute: 0.1 10*3/uL (ref 0.0–0.1)
Basophils Relative: 1 %
Eosinophils Absolute: 0.2 10*3/uL (ref 0.0–0.5)
Eosinophils Relative: 2 %
HCT: 27.1 % — ABNORMAL LOW (ref 36.0–46.0)
Hemoglobin: 8.8 g/dL — ABNORMAL LOW (ref 12.0–15.0)
Immature Granulocytes: 1 %
Lymphocytes Relative: 9 %
Lymphs Abs: 0.8 10*3/uL (ref 0.7–4.0)
MCH: 28.9 pg (ref 26.0–34.0)
MCHC: 32.5 g/dL (ref 30.0–36.0)
MCV: 88.9 fL (ref 80.0–100.0)
Monocytes Absolute: 0.5 10*3/uL (ref 0.1–1.0)
Monocytes Relative: 5 %
Neutro Abs: 7.9 10*3/uL — ABNORMAL HIGH (ref 1.7–7.7)
Neutrophils Relative %: 82 %
Platelets: 267 10*3/uL (ref 150–400)
RBC: 3.05 MIL/uL — ABNORMAL LOW (ref 3.87–5.11)
RDW: 17.1 % — ABNORMAL HIGH (ref 11.5–15.5)
WBC: 9.5 10*3/uL (ref 4.0–10.5)
nRBC: 0 % (ref 0.0–0.2)

## 2022-04-21 LAB — PROTIME-INR
INR: 1.2 (ref 0.8–1.2)
Prothrombin Time: 15 seconds (ref 11.4–15.2)

## 2022-04-21 LAB — MAGNESIUM: Magnesium: 1.7 mg/dL (ref 1.7–2.4)

## 2022-04-21 MED ORDER — ONDANSETRON 4 MG PO TBDP
4.0000 mg | ORAL_TABLET | Freq: Once | ORAL | Status: AC
  Administered 2022-04-21: 4 mg via ORAL
  Filled 2022-04-21: qty 1

## 2022-04-21 MED ORDER — HYDROMORPHONE HCL 1 MG/ML IJ SOLN
0.5000 mg | Freq: Once | INTRAMUSCULAR | Status: AC
  Administered 2022-04-21: 0.5 mg via INTRAVENOUS
  Filled 2022-04-21: qty 1

## 2022-04-21 MED ORDER — OXYCODONE-ACETAMINOPHEN 5-325 MG PO TABS
2.0000 | ORAL_TABLET | Freq: Once | ORAL | Status: AC
  Administered 2022-04-21: 2 via ORAL
  Filled 2022-04-21: qty 2

## 2022-04-21 NOTE — ED Triage Notes (Signed)
Patient had mechanical fall landing on left shoulder. Patient with pain with any ROM. Positive distal pulses

## 2022-04-21 NOTE — Discharge Instructions (Addendum)
Thank you for coming to [facility] Emergency [facility]. You were seen for a fall and left shoulder pain. We did an exam, labs, and imaging, and these showed a fractured left humerus. You will follow-up with [facility] with Dr. Victorino December, whose contact information is provided.  Please call them on Monday morning to schedule a follow-up appointment. We tested your labs your hemoglobin was 8.8, improved from the other day.  Your sodium potassium were also normal at this time.  Your kidney function was at baseline  Do not hesitate to return to the ED or call 911 if you experience: -Worsening symptoms -Numbness tingling of arm or hand -Lightheadedness, passing out -Fevers/chills -Anything else that concerns you

## 2022-04-21 NOTE — ED Provider Triage Note (Signed)
Emergency Medicine Provider Triage Evaluation Note f Tiffanee Mcnee , a 56 y.o. female  was evaluated in triage.  Pt complains of fall. Fell on L shoulder.+ dislocation. Hx of same. No head injury. 10'/10 pain  Review of Systems  Positive: Shoulder dislocation Negative: Numbness or tingling  Physical Exam  BP 121/78 (BP Location: Right Arm)   Pulse 75   Temp 98.2 F (36.8 C) (Oral)   Resp 18   SpO2 100%  Gen:   Awake, no distress   Resp:  Normal effort  MSK:   Moves extremities without difficulty  Other:  Lef shoulder + sulcus sign  Medical Decision Making  Medically screening exam initiated at 1:33 PM.  Appropriate orders placed.  Bernard Donahoo was informed that the remainder of the evaluation will be completed by another provider, this initial triage assessment does not replace that evaluation, and the importance of remaining in the ED until their evaluation is complete.  Work up [employer], [place], PA-C 04/21/22 1335

## 2022-04-21 NOTE — ED Provider Notes (Signed)
[facility] EMERGENCY DEPARTMENT Provider Note   CSN: [id number] Arrival date & time: 04/21/22  1322     History    Jaime Gomez is a 56 y.o. female with endometrial cancer stage IVb, chronic right DVT, agranulocytosis, CKD stage III, HTN, gastroparesis, T2DM, history of PE presents with left shoulder pain, fall.  Patient experienced a mechanical fall today tripped while walking and fell onto her left shoulder.  Reports acute onset pain to the left shoulder without any numbness tingling in the arm or hand.  Did not hit her head or lose consciousness, denies any head or neck pain.  Denies pain anywhere else.  Patient has endometrial cancer and takes an oral chemotherapy agent every day by mouth.  Recently had a blood transfusion for hemoglobin of 5.  Reports that she otherwise was in her usual state of health and did not have any other acute concerns other than the shoulder.  HPI     Home Medications Prior to Admission medications   Medication Sig Start Date End Date Taking? Authorizing Provider  acetaminophen (TYLENOL) 500 MG tablet Take 500 mg by mouth every 6 (six) hours as needed for mild pain.   Yes [provider]  carvedilol (COREG) 25 MG tablet Take 25 mg by mouth daily at 6 (six) AM.   Yes [provider]  everolimus (AFINITOR) 10 MG tablet Take 10 mg by mouth daily at 6 (six) AM. 08/18/20  Yes [provider]  folic acid (FOLVITE) 1 MG tablet Take 1 mg by mouth daily at 6 (six) AM. 10/11/20  Yes [provider]  fondaparinux (ARIXTRA) 7.5 MG/0.6ML SOLN injection Inject 0.6 mLs (7.5 mg total) into the skin daily. 03/10/22  Yes Ennever, Rudell Cobb, MD  gabapentin (NEURONTIN) 100 MG capsule Take 100 mg by mouth 2 (two) times daily. 08/18/20  Yes [provider]  insulin aspart protamine - aspart (NOVOLOG MIX 70/30 FLEXPEN) (70-30) 100 UNIT/ML FlexPen Inject 20 Units into the skin as directed. Per meal 01/19/21  Yes [provider]  insulin detemir (LEVEMIR) 100 UNIT/ML injection Inject 20 Units into the skin 2 (two) times daily. 01/19/21  Yes [provider]  letrozole (FEMARA) 2.5 MG tablet Take 2.5 mg by mouth daily at 6 (six) AM. 05/12/21  Yes [provider]  mirabegron ER (MYRBETRIQ) 25 MG TB24 tablet Take 1 tablet (25 mg total) by mouth daily for 360 doses. 09/02/21 08/28/22 Yes Janith Lima, MD  nitrofurantoin, macrocrystal-monohydrate, (MACROBID) 100 MG capsule Take 1 capsule (100 mg total) by mouth 2 (two) times daily. 11/07/21  Yes Ennever, Rudell Cobb, MD  ondansetron (ZOFRAN) 8 MG tablet Take 8 mg by mouth every 8 (eight) hours as needed. 08/18/20  Yes [provider]  oxycodone (OXY-IR) 5 MG capsule Take 1 capsule (5 mg total) by mouth every 4 (four) hours as needed. Patient taking differently: Take 5 mg by mouth every 4 (four) hours as needed for pain. 04/13/22  Yes [name], MD  pantoprazole (PROTONIX) 40 MG tablet Take 40 mg by mouth daily. 10/15/20  Yes [provider]  polyethylene glycol (MIRALAX / GLYCOLAX) 17 g packet Take 17 g by mouth daily as needed for mild constipation. 09/05/21  Yes Pokhrel, Laxman, MD  pravastatin (PRAVACHOL) 80 MG tablet Take 80 mg by mouth daily at 6 (six) AM.   Yes [provider]  prochlorperazine (COMPAZINE) 10 MG tablet Take 10 mg by mouth every 6 (six) hours as needed  for nausea or vomiting. 08/18/21  Yes [provider]      Allergies    Taxotere [docetaxel]    Review of Systems   Review of Systems Review of systems negative for LOC.  A 10 point review of systems was performed and is negative unless otherwise reported in HPI.  Physical Exam Updated Vital Signs BP 138/72   Pulse 77   Temp 98.2 F (36.8 C) (Oral)   Resp 18   SpO2 99%  Physical Exam General: Normal appearing female, lying in bed.  HEENT: Sclera anicteric, MMM, trachea midline. Cardiology: RRR, no murmurs/rubs/gallops. BL radial and  DP pulses equal bilaterally.  Resp: Normal respiratory rate and effort. CTAB, no wheezes, rhonchi, crackles.  Abd: Soft, non-tender, non-distended. No rebound tenderness or guarding.  GU: Deferred. MSK: In sling. TTP diffusely of L shoulder. No acromioclavicular stepoff, no obvious deformity to the shoulder. Compartment is soft. Intact sensation, intact radial pulse. Intact ROM of wrist/hand/fingers. ROM of shoulder/elbow limited d/t pain. Skin: warm, dry. No rashes or lesions. Neuro: A&Ox4, CNs II-XII grossly intact. MAEs. Sensation grossly intact.  Psych: Normal mood and affect.   ED Results / Procedures / Treatments   Labs (all labs ordered are listed, but only abnormal results are displayed) Labs Reviewed  BASIC METABOLIC PANEL - Abnormal; Notable for the following components:      Result Value   Glucose, Bld 269 (*)    BUN 47 (*)    Creatinine, Ser 2.88 (*)    GFR, Estimated 18 (*)    All other components within normal limits  CBC WITH DIFFERENTIAL/PLATELET - Abnormal; Notable for the following components:   RBC 3.05 (*)    Hemoglobin 8.8 (*)    HCT 27.1 (*)    RDW 17.1 (*)    Neutro Abs 7.9 (*)    Abs Immature Granulocytes 0.08 (*)    All other components within normal limits  MAGNESIUM  PROTIME-INR    Radiology CT SHOULDER LEFT WO CONTRAST  Result Date: 04/21/2022 CLINICAL DATA:  Shoulder trauma; x-ray done EXAM: CT OF THE UPPER LEFT EXTREMITY WITHOUT CONTRAST TECHNIQUE: Multidetector CT imaging of the upper left extremity was performed according to the standard protocol. RADIATION DOSE REDUCTION: This exam was performed according to the departmental dose-optimization program which includes automated exposure control, adjustment of the mA and/or kV according to patient size and/or use of iterative reconstruction technique. COMPARISON:  Radiographs 04/21/2022 FINDINGS: Bones/Joint/Cartilage Interval reduction of the dislocated left humeral head since radiographs earlier  today. Redemonstrated comminuted fracture of the anatomic neck proximal humerus which is minimally displaced. No definite intra-articular extension. Ligaments Suboptimally assessed by CT. Muscles and Tendons Contusion about the anterior deltoid and pectoralis muscles. Soft tissues Nodular consolidation near the right hilum with interlobular septal thickening and some architectural distortion and bronchiolectasis. This may be related to posttreatment change though malignancy is not excluded on this limited exam. IMPRESSION: 1. Interval reduction of dislocated left humeral head with redemonstrated fracture of the proximal humerus. 2. Nodular consolidation the right hilum architectural distortion bronchiolectasis may be related to radiation fibrosis though underlying malignancy is not excluded on this limited exam. If clinically chest CT is recommended further evaluation. Electronically Signed   By: Placido Sou M.D.   On: 04/21/2022 21:38   DG Shoulder Left  Result Date: 04/21/2022 CLINICAL DATA:  LEFT shoulder dislocation post fall EXAM: LEFT SHOULDER - 2+ VIEW COMPARISON:  None FINDINGS: Osseous demineralization. AC joint alignment normal. Comminuted fracture proximal humerus, minimally  displaced. Humeral head appears dislocated anteriorly. Visualized ribs intact. IMPRESSION: Anterior glenohumeral dislocation with mildly displaced proximal RIGHT humeral fracture. Electronically Signed   By: Lavonia Dana M.D.   On: 04/21/2022 14:35    Procedures Procedures    Medications Ordered in ED Medications  oxyCODONE-acetaminophen (PERCOCET/ROXICET) 5-325 MG per tablet 2 tablet (2 tablets Oral Given 04/21/22 1336)  ondansetron (ZOFRAN-ODT) disintegrating tablet 4 mg (4 mg Oral Given 04/21/22 1336)  HYDROmorphone (DILAUDID) injection 0.5 mg (0.5 mg Intravenous Given 04/21/22 1925)    ED Course/ Medical Decision Making/ A&P                          Medical Decision Making Amount and/or Complexity of Data  Reviewed Labs: ordered. Decision-making details documented in ED Course. Radiology:  Decision-making details documented in ED Course.  Risk Prescription drug management.   For patient's fall w/ left shoulder pain, consider shoulder dislocation, humeral fracture. Consider posterior and inferior dislocations/subluxations as well. Fx of clavicle or scapula less likely. Will eval w/ XRs. Patient just had blood transfusion and some electrolyte abnormalities so will obtain basic labs to ensure we don't need to transfuse again. No external bleeding noted in history.  XR reads humeral fracture with anterior dislocation. Consulted to orthopedic surgery.  I have personally reviewed and interpreted all labs and imaging, listed below.   Clinical Course as of 04/21/22 2221  Fri Apr 21, 2022  1945 D/w orthopedic surgery who states that she potentially is not dislocated, rather there is blood in the capsule causing separation.  [HN]  2008 Hemoglobin(!): 8.8 Improved from 5 the other day after transfusion. [HN]  2009 Creatinine(!): 2.88 BUN and Cr at her approx baseline. [HN]  2010 Sodium: 138 [HN]  2010 Potassium: 4.6 [HN]  2024 Ortho requests CT shoulder to characterize injury. [HN]  2148 CT SHOULDER LEFT WO CONTRAST 1. Interval reduction of dislocated left humeral head with redemonstrated fracture of the proximal humerus. 2. Nodular consolidation the right hilum architectural distortion bronchiolectasis may be related to radiation fibrosis though underlying malignancy is not excluded on this limited exam. If clinically chest CT is recommended further evaluation.  [HN]    Clinical Course User Index [HN] Audley Hose, MD   Patient labs at her baseline and Hgb improved from the other day.  Patient is still neurovascularly intact in the left upper extremity.  D/w patient who will remain in a sling until f/u with Dr. Victorino December at [facility] Patient reports understanding. She already has  oxycodone 5 mg at home and that is what I would recommend for pain control. Given extensive DC instructions/return precautions. Patient and her friend at bedside report understanding. Friend will provide patient a ride home.  Dispo: DC         Final Clinical Impression(s) / ED Diagnoses Final diagnoses:  Other closed displaced fracture of proximal end of left humerus, initial encounter    Rx / DC Orders ED Discharge Orders     None        This note was created using dictation software, which may contain spelling or grammatical errors.    Audley Hose, MD 04/21/22 2231

## 2022-04-27 ENCOUNTER — Other Ambulatory Visit: Payer: Self-pay | Admitting: *Deleted

## 2022-04-27 ENCOUNTER — Inpatient Hospital Stay: Payer: BC Managed Care – PPO

## 2022-04-27 ENCOUNTER — Telehealth: Payer: Self-pay | Admitting: *Deleted

## 2022-04-27 ENCOUNTER — Inpatient Hospital Stay (HOSPITAL_BASED_OUTPATIENT_CLINIC_OR_DEPARTMENT_OTHER): Payer: BC Managed Care – PPO | Admitting: Hematology & Oncology

## 2022-04-27 ENCOUNTER — Inpatient Hospital Stay: Payer: BC Managed Care – PPO | Attending: Hematology & Oncology

## 2022-04-27 VITALS — BP 151/84 | HR 88 | Temp 98.2°F | Resp 16 | Ht 65.0 in | Wt 154.0 lb

## 2022-04-27 DIAGNOSIS — N189 Chronic kidney disease, unspecified: Secondary | ICD-10-CM | POA: Diagnosis not present

## 2022-04-27 DIAGNOSIS — C541 Malignant neoplasm of endometrium: Secondary | ICD-10-CM

## 2022-04-27 DIAGNOSIS — E611 Iron deficiency: Secondary | ICD-10-CM | POA: Diagnosis present

## 2022-04-27 DIAGNOSIS — Z95828 Presence of other vascular implants and grafts: Secondary | ICD-10-CM

## 2022-04-27 DIAGNOSIS — D631 Anemia in chronic kidney disease: Secondary | ICD-10-CM

## 2022-04-27 DIAGNOSIS — R971 Elevated cancer antigen 125 [CA 125]: Secondary | ICD-10-CM | POA: Insufficient documentation

## 2022-04-27 DIAGNOSIS — I82501 Chronic embolism and thrombosis of unspecified deep veins of right lower extremity: Secondary | ICD-10-CM

## 2022-04-27 LAB — RETICULOCYTES
Immature Retic Fract: 15.9 % (ref 2.3–15.9)
RBC.: 2.43 MIL/uL — ABNORMAL LOW (ref 3.87–5.11)
Retic Count, Absolute: 54.7 10*3/uL (ref 19.0–186.0)
Retic Ct Pct: 2.3 % (ref 0.4–3.1)

## 2022-04-27 LAB — CMP (CANCER CENTER ONLY)
ALT: <5 U/L (ref 0–44)
AST: 6 U/L — ABNORMAL LOW (ref 15–41)
Albumin: 3.8 g/dL (ref 3.5–5.0)
Alkaline Phosphatase: 101 U/L (ref 38–126)
Anion gap: 10 (ref 5–15)
BUN: 57 mg/dL — ABNORMAL HIGH (ref 6–20)
CO2: 22 mmol/L (ref 22–32)
Calcium: 9.4 mg/dL (ref 8.9–10.3)
Chloride: 104 mmol/L (ref 98–111)
Creatinine: 3.04 mg/dL (ref 0.44–1.00)
GFR, Estimated: 17 mL/min — ABNORMAL LOW (ref >60)
Glucose, Bld: 192 mg/dL — ABNORMAL HIGH (ref 70–99)
Potassium: 4.2 mmol/L (ref 3.5–5.1)
Sodium: 136 mmol/L (ref 135–145)
Total Bilirubin: 0.4 mg/dL (ref 0.3–1.2)
Total Protein: 7.2 g/dL (ref 6.5–8.1)

## 2022-04-27 LAB — CBC WITH DIFFERENTIAL (CANCER CENTER ONLY)
Abs Immature Granulocytes: 0.06 10*3/uL (ref 0.00–0.07)
Basophils Absolute: 0 10*3/uL (ref 0.0–0.1)
Basophils Relative: 1 %
Eosinophils Absolute: 0.3 10*3/uL (ref 0.0–0.5)
Eosinophils Relative: 5 %
HCT: 22.4 % — ABNORMAL LOW (ref 36.0–46.0)
Hemoglobin: 6.9 g/dL — CL (ref 12.0–15.0)
Immature Granulocytes: 1 %
Lymphocytes Relative: 18 %
Lymphs Abs: 1 10*3/uL (ref 0.7–4.0)
MCH: 28.2 pg (ref 26.0–34.0)
MCHC: 30.8 g/dL (ref 30.0–36.0)
MCV: 91.4 fL (ref 80.0–100.0)
Monocytes Absolute: 0.4 10*3/uL (ref 0.1–1.0)
Monocytes Relative: 8 %
Neutro Abs: 3.8 10*3/uL (ref 1.7–7.7)
Neutrophils Relative %: 67 %
Platelet Count: 244 10*3/uL (ref 150–400)
RBC: 2.45 MIL/uL — ABNORMAL LOW (ref 3.87–5.11)
RDW: 17.1 % — ABNORMAL HIGH (ref 11.5–15.5)
WBC Count: 5.5 10*3/uL (ref 4.0–10.5)
nRBC: 0 % (ref 0.0–0.2)

## 2022-04-27 LAB — PREPARE RBC (CROSSMATCH)

## 2022-04-27 LAB — FERRITIN: Ferritin: 1481 ng/mL — ABNORMAL HIGH (ref 11–307)

## 2022-04-27 MED ORDER — HEPARIN SOD (PORK) LOCK FLUSH 100 UNIT/ML IV SOLN
500.0000 [IU] | Freq: Once | INTRAVENOUS | Status: AC
  Administered 2022-04-27: 500 [IU] via INTRAVENOUS

## 2022-04-27 MED ORDER — SODIUM CHLORIDE 0.9% FLUSH
10.0000 mL | Freq: Once | INTRAVENOUS | Status: AC
  Administered 2022-04-27: 10 mL via INTRAVENOUS

## 2022-04-27 NOTE — Progress Notes (Signed)
Hematology and Oncology Follow Up Visit  Jaime Gomez [id number] 1966-04-25 56 y.o. 04/27/2022   Principle Diagnosis:  Metastatic adenocarcinoma of the endometrium -- No Actionable mutations Chronic renal failure Recurrent thromboembolic disease  Current Therapy:   Afinitor/letrozole Blood transfusion as needed Eliquis 2.5 mg p.o. twice daily --increase to 5 mg p.o. twice daily on 03/03/2022 -- d/c on 03/13/2022 Arixtra 7.5 mg sq q day --  start on 03/13/2022     Interim History:  Jaime Gomez is back for follow-up.  She comes in with her sister.  Her sister is over from [place].  Jaime Gomez, unfortunately, fell on 29 September.  She broke her left humerus.  She had little bit of a shoulder dislocation.  She is now in a harness.  She did not need surgery, thankfully.  Thankfully, she did not bleed.  She is on Arixtra.  She is not sure she is going to need surgery.  I think she was back to see the Orthopedist in week or so.  She, thankfully, his somewhat stable with respect to her uterine cancer.  Her last CA-125 was down to 207.  She continues on the Afinitor and letrozole.  She has had a decent appetite.  She does have the nephrostomy tubes in.  She says she did not have these exchanged on 27 October.  She has little bit of leg swelling.  I think some of this is from the anemia.  We will have to go and transfuse her.  She is having some pain in the left arm and shoulder.  She is already on pain medication which does seem to help.  Overall, I would have said that her performance status is probably ECOG 1.    Medications:  Current Outpatient Medications:    acetaminophen (TYLENOL) 500 MG tablet, Take 500 mg by mouth every 6 (six) hours as needed for mild pain., Disp: , Rfl:    carvedilol (COREG) 25 MG tablet, Take 25 mg by mouth daily at 6 (six) AM., Disp: , Rfl:    everolimus (AFINITOR) 10 MG tablet, Take 10 mg by mouth daily at 6 (six) AM., Disp: , Rfl:    folic acid (FOLVITE) 1 MG  tablet, Take 1 mg by mouth daily at 6 (six) AM., Disp: , Rfl:    fondaparinux (ARIXTRA) 7.5 MG/0.6ML SOLN injection, Inject 0.6 mLs (7.5 mg total) into the skin daily., Disp: 18 mL, Rfl: 6   gabapentin (NEURONTIN) 100 MG capsule, Take 100 mg by mouth 2 (two) times daily., Disp: , Rfl:    insulin aspart protamine - aspart (NOVOLOG MIX 70/30 FLEXPEN) (70-30) 100 UNIT/ML FlexPen, Inject 20 Units into the skin as directed. Per meal, Disp: , Rfl:    insulin detemir (LEVEMIR) 100 UNIT/ML injection, Inject 20 Units into the skin 2 (two) times daily., Disp: , Rfl:    letrozole (FEMARA) 2.5 MG tablet, Take 2.5 mg by mouth daily at 6 (six) AM., Disp: , Rfl:    mirabegron ER (MYRBETRIQ) 25 MG TB24 tablet, Take 1 tablet (25 mg total) by mouth daily for 360 doses., Disp: 30 tablet, Rfl: 11   nitrofurantoin, macrocrystal-monohydrate, (MACROBID) 100 MG capsule, Take 1 capsule (100 mg total) by mouth 2 (two) times daily., Disp: 180 capsule, Rfl: 6   ondansetron (ZOFRAN) 8 MG tablet, Take 8 mg by mouth every 8 (eight) hours as needed., Disp: , Rfl:    oxycodone (OXY-IR) 5 MG capsule, Take 1 capsule (5 mg total) by mouth every 4 (four) hours  as needed. (Patient taking differently: Take 5 mg by mouth every 4 (four) hours as needed for pain.), Disp: 120 capsule, Rfl: 0   pantoprazole (PROTONIX) 40 MG tablet, Take 40 mg by mouth daily., Disp: , Rfl:    polyethylene glycol (MIRALAX / GLYCOLAX) 17 g packet, Take 17 g by mouth daily as needed for mild constipation., Disp: 14 each, Rfl: 0   pravastatin (PRAVACHOL) 80 MG tablet, Take 80 mg by mouth daily at 6 (six) AM., Disp: , Rfl:    prochlorperazine (COMPAZINE) 10 MG tablet, Take 10 mg by mouth every 6 (six) hours as needed for nausea or vomiting., Disp: , Rfl:   Allergies:  Allergies  Allergen Reactions   Taxotere [Docetaxel]     Throat swoll    Past Medical History, Surgical history, Social history, and Family History were reviewed and updated.  Review of  Systems: Review of Systems  Constitutional:  Positive for fatigue.  HENT:  Negative.    Eyes: Negative.   Respiratory: Negative.    Cardiovascular:  Positive for leg swelling.  Gastrointestinal:  Positive for abdominal pain.  Genitourinary:  Positive for pelvic pain.   Musculoskeletal: Negative.   Skin: Negative.   Neurological: Negative.   Hematological: Negative.   Psychiatric/Behavioral: Negative.      Physical Exam:  height is '5\' 5"'$  (1.651 m) and weight is 154 lb (69.9 kg). Her oral temperature is 98.2 F (36.8 C). Her blood pressure is 151/84 (abnormal) and her pulse is 88. Her respiration is 16 and oxygen saturation is 100%.   Wt Readings from Last 3 Encounters:  04/27/22 154 lb (69.9 kg)  04/03/22 154 lb (69.9 kg)  03/13/22 158 lb 6.4 oz (71.8 kg)    Physical Exam Vitals reviewed.  HENT:     Head: Normocephalic and atraumatic.  Eyes:     Pupils: Pupils are equal, round, and reactive to light.  Cardiovascular:     Rate and Rhythm: Normal rate and regular rhythm.     Heart sounds: Normal heart sounds.  Pulmonary:     Effort: Pulmonary effort is normal.     Breath sounds: Normal breath sounds.  Abdominal:     General: Bowel sounds are normal.     Palpations: Abdomen is soft.  Musculoskeletal:        General: No tenderness or deformity. Normal range of motion.     Cervical back: Normal range of motion.  Lymphadenopathy:     Cervical: No cervical adenopathy.  Skin:    General: Skin is warm and dry.     Findings: No erythema or rash.  Neurological:     Mental Status: She is alert and oriented to person, place, and time.  Psychiatric:        Behavior: Behavior normal.        Thought Content: Thought content normal.        Judgment: Judgment normal.     Lab Results  Component Value Date   WBC 5.5 04/27/2022   HGB 6.9 (LL) 04/27/2022   HCT 22.4 (L) 04/27/2022   MCV 91.4 04/27/2022   PLT 244 04/27/2022     Chemistry      Component Value Date/Time   NA  136 04/27/2022 1530   K 4.2 04/27/2022 1530   CL 104 04/27/2022 1530   CO2 22 04/27/2022 1530   BUN 57 (H) 04/27/2022 1530   CREATININE 3.04 (HH) 04/27/2022 1530      Component Value Date/Time   CALCIUM 9.4 04/27/2022  1530   ALKPHOS 101 04/27/2022 1530   AST 6 (L) 04/27/2022 1530   ALT <5 04/27/2022 1530   BILITOT 0.4 04/27/2022 1530      Impression and Plan: Ms. Suchecki is a very charming 56 year old white female.  She has metastatic endometrial cancer.  She is on Afinitor/letrozole.  The PET scan that she had done back in May shows that she was progressing. Unfortunately, we do not have any actionable mutations that we could try to target.  She is doing well on the letrozole/Afinitor combination.  We will have to see what the CA-125 looks like.  I know this will be helpful.  She does need to have another PET scan done.  I probably will have to do this in November.  She continues on the Arixtra.  I think this is a good option for her with respect to her thromboembolic disease.  She does have an IVC filter in place which will stay there.  We will go ahead and transfuse her tomorrow.  We will give her 2 units of blood.  I think this will help make her feel little bit better.  Hopefully might help with some of the healing of this fracture.  I told her that the biggest detriment to her healing of this fracture is a diabetes.  Her blood sugars are always on the high side.  Given that she has a fracture, and that is hard for her to get around, we will try to move her appointment out a little bit longer.  Thankfully, her sister is here right now.  I think her sister goes back to [place] in a week or so.  We will try to get the PET scan before I see her back.  She can always let us know if there are any problems and we can get her in sooner.    [name], MD 10/5/20234:08 PM

## 2022-04-27 NOTE — Patient Instructions (Signed)

## 2022-04-27 NOTE — Telephone Encounter (Signed)
Hgb 6.8.  Dr Marin Olp notified.  2 u prbcs tomorrow per dr Marin Olp

## 2022-04-27 NOTE — Addendum Note (Signed)
Addended by: Amelia Jo I on: 04/27/2022 04:10 PM   Modules accepted: Orders

## 2022-04-28 ENCOUNTER — Other Ambulatory Visit: Payer: Self-pay | Admitting: Family

## 2022-04-28 ENCOUNTER — Inpatient Hospital Stay: Payer: BC Managed Care – PPO

## 2022-04-28 ENCOUNTER — Telehealth: Payer: Self-pay

## 2022-04-28 DIAGNOSIS — D509 Iron deficiency anemia, unspecified: Secondary | ICD-10-CM

## 2022-04-28 DIAGNOSIS — D631 Anemia in chronic kidney disease: Secondary | ICD-10-CM

## 2022-04-28 DIAGNOSIS — E611 Iron deficiency: Secondary | ICD-10-CM | POA: Diagnosis not present

## 2022-04-28 LAB — IRON AND IRON BINDING CAPACITY (CC-WL,HP ONLY)
Iron: 25 ug/dL — ABNORMAL LOW (ref 28–170)
Saturation Ratios: 12 % (ref 10.4–31.8)
TIBC: 209 ug/dL — ABNORMAL LOW (ref 250–450)
UIBC: 184 ug/dL (ref 148–442)

## 2022-04-28 MED ORDER — HEPARIN SOD (PORK) LOCK FLUSH 100 UNIT/ML IV SOLN: 500.0000 [IU] | Freq: Every day | INTRAVENOUS | Status: DC | PRN

## 2022-04-28 MED ORDER — FUROSEMIDE 10 MG/ML IJ SOLN: 20.0000 mg | Freq: Once | INTRAMUSCULAR | Status: DC

## 2022-04-28 MED ORDER — SODIUM CHLORIDE 0.9% IV SOLUTION
250.0000 mL | Freq: Once | INTRAVENOUS | Status: AC
  Administered 2022-04-28: 250 mL via INTRAVENOUS

## 2022-04-28 MED ORDER — SODIUM CHLORIDE 0.9% IV SOLUTION: 250.0000 mL | Freq: Once | INTRAVENOUS | Status: DC

## 2022-04-28 MED ORDER — ACETAMINOPHEN 325 MG PO TABS
650.0000 mg | ORAL_TABLET | Freq: Once | ORAL | Status: AC
  Administered 2022-04-28: 650 mg via ORAL

## 2022-04-28 MED ORDER — SODIUM CHLORIDE 0.9% FLUSH: 10.0000 mL | INTRAVENOUS | Status: DC | PRN

## 2022-04-28 MED ORDER — DIPHENHYDRAMINE HCL 25 MG PO CAPS: 25.0000 mg | ORAL_CAPSULE | Freq: Once | ORAL | Status: DC

## 2022-04-28 NOTE — Telephone Encounter (Addendum)
Message given to pt during blood transfusion appt today. Patient does not wish to add additional time to today's appt and will come in next week for Venofer. Dr Marin Olp aware. dph  ----- Message from [name], MD sent at 04/28/2022 12:42 PM EDT ----- Call.  Her iron is actually low.  We need to give her some IV iron.  I know she is here right now.  Can we do it today?

## 2022-04-28 NOTE — Patient Instructions (Signed)
Blood Transfusion, Adult A blood transfusion is a procedure in which you receive blood through an IV tube. You may need this procedure because of: A bleeding disorder. An illness. An injury. A surgery. The blood may come from someone else (a donor). You may also be able to donate blood for yourself before a surgery. The blood given in a transfusion may be made up of different types of cells. You may get: Red blood cells. These carry oxygen to the cells in the body. Platelets. These help your blood to clot. Plasma. This is the liquid part of your blood. It carries proteins and other substances through the body. White blood cells. These help you fight infections. If you have a clotting disorder, you may also get other types of blood products. Depending on the type of blood product, this procedure may take 1-4 hours to complete. Tell your doctor about: Any bleeding problems you have. Any reactions you have had during a blood transfusion in the past. Any allergies you have. All medicines you are taking, including vitamins, herbs, eye drops, creams, and over-the-counter medicines. Any surgeries you have had. Any medical conditions you have. Whether you are pregnant or may be pregnant. What are the risks? Talk with your health care provider about risks. The most common problems include: A mild allergic reaction. This includes red, swollen areas of skin (hives) and itching. Fever or chills. This may be the body's response to new blood cells received. This may happen during or up to 4 hours after the transfusion. More serious problems may include: A serious allergic reaction. This includes breathing trouble or swelling around the face and lips. Too much fluid in the lungs. This may cause breathing problems. Lung injury. This causes breathing trouble and low oxygen in the blood. This can happen within hours of the transfusion or days later. Too much iron. This can happen after getting many blood  transfusions over a period of time. An infection or virus passed through the blood. This is rare. Donated blood is carefully tested before it is given. Your body's defense system (immune system) trying to attack the new blood cells. This is rare. Symptoms may include fever, chills, nausea, low blood pressure, and low back or chest pain. Donated cells attacking healthy tissues. This is rare. What happens before the procedure? You will have a blood test to find out your blood type. The test also finds out what type of blood your body will accept and matches it to the donor type. If you are going to have a planned surgery, you may be able to donate your own blood. This may be done in case you need a transfusion. You will have your temperature, blood pressure, and pulse checked. You may receive medicine to help prevent an allergic reaction. This may be done if you have had a reaction to a transfusion before. This medicine may be given to you by mouth or through an IV tube. What happens during the procedure?  An IV tube will be put into one of your veins. The bag of blood will be attached to your IV tube. Then, the blood will enter through your vein. Your temperature, blood pressure, and pulse will be checked often. This is done to find early signs of a transfusion reaction. Tell your nurse right away if you have any of these symptoms: Shortness of breath or trouble breathing. Chest or back pain. Fever or chills. Red, swollen areas of skin or itching. If you have any signs   or symptoms of a reaction, your transfusion will be stopped. You may also be given medicine. When the transfusion is finished, your IV tube will be taken out. Pressure may be put on the IV site for a few minutes. A bandage (dressing) will be put on the IV site. The procedure may vary among doctors and hospitals. What happens after the procedure? You will be monitored until you leave the hospital or clinic. This includes  checking your temperature, blood pressure, pulse, breathing rate, and blood oxygen level. Your blood may be tested to see how you have responded to the transfusion. You may be warmed with fluids or blankets. This is done to keep the temperature of your body normal. If you have your procedure in an outpatient setting, you will be told whom to contact to report any reactions. Where to find more information Visit the [employer]: redcross.org Summary A blood transfusion is a procedure in which you receive blood through an IV tube. The blood you are given may be made up of different blood cells. You may receive red blood cells, platelets, plasma, or white blood cells. Your temperature, blood pressure, and pulse will be checked often. After the procedure, your blood may be tested to see how you have responded. This information is not intended to replace advice given to you by your health care provider. Make sure you discuss any questions you have with your health care provider. Document Revised: 10/07/2021 Document Reviewed: 10/07/2021 Elsevier Patient Education  2023 Elsevier Inc.  

## 2022-04-29 LAB — CA 125: Cancer Antigen (CA) 125: 289 U/mL — ABNORMAL HIGH (ref 0.0–38.1)

## 2022-05-01 LAB — TYPE AND SCREEN
ABO/RH(D): A POS
Antibody Screen: POSITIVE
DAT, IgG: NEGATIVE
Donor AG Type: NEGATIVE
Donor AG Type: NEGATIVE
Unit division: 0
Unit division: 0

## 2022-05-01 LAB — BPAM RBC
Blood Product Expiration Date: 202311012359
Blood Product Expiration Date: 202311022359
ISSUE DATE / TIME: 202310060844
ISSUE DATE / TIME: 202310060844
Unit Type and Rh: 600
Unit Type and Rh: 600

## 2022-05-04 ENCOUNTER — Inpatient Hospital Stay: Payer: BC Managed Care – PPO

## 2022-05-04 VITALS — BP 151/81 | HR 72 | Temp 98.0°F | Resp 17

## 2022-05-04 DIAGNOSIS — D509 Iron deficiency anemia, unspecified: Secondary | ICD-10-CM

## 2022-05-04 DIAGNOSIS — E611 Iron deficiency: Secondary | ICD-10-CM | POA: Diagnosis not present

## 2022-05-04 MED ORDER — SODIUM CHLORIDE 0.9 % IV SOLN: Freq: Once | INTRAVENOUS | Status: AC

## 2022-05-04 MED ORDER — HEPARIN SOD (PORK) LOCK FLUSH 100 UNIT/ML IV SOLN
500.0000 [IU] | Freq: Once | INTRAVENOUS | Status: AC | PRN
  Administered 2022-05-04: 500 [IU]

## 2022-05-04 MED ORDER — SODIUM CHLORIDE 0.9% FLUSH
10.0000 mL | Freq: Once | INTRAVENOUS | Status: AC | PRN
  Administered 2022-05-04: 10 mL

## 2022-05-04 MED ORDER — ALTEPLASE 2 MG IJ SOLR: 2.0000 mg | Freq: Once | INTRAMUSCULAR | Status: DC | PRN

## 2022-05-04 MED ORDER — SODIUM CHLORIDE 0.9 % IV SOLN
300.0000 mg | Freq: Once | INTRAVENOUS | Status: AC
  Administered 2022-05-04: 300 mg via INTRAVENOUS
  Filled 2022-05-04: qty 300

## 2022-05-04 NOTE — Patient Instructions (Signed)

## 2022-05-04 NOTE — Progress Notes (Signed)
COVID Vaccine Completed:  Date of COVID positive in last 90 days:  PCP - Sandi Raveling, [place] Cardiologist -   Chest x-ray - 09/01/21 Epic EKG - 09/01/21 Epic Stress Test -  ECHO -  Cardiac Cath -  Pacemaker/ICD device last checked: Spinal Cord Stimulator:  Bowel Prep -   Sleep Study -  CPAP -   Fasting Blood Sugar -  Checks Blood Sugar _____ times a day libre?  Blood Thinner Instructions: Arixtra injection  Eliquis?? Aspirin Instructions: Last Dose:  Activity level:  Can go up a flight of stairs and perform activities of daily living without stopping and without symptoms of chest pain or shortness of breath.  Able to exercise without symptoms  Unable to go up a flight of stairs without symptoms of     Anesthesia review:   Patient denies shortness of breath, fever, cough and chest pain at PAT appointment  Patient verbalized understanding of instructions that were given to them at the PAT appointment. Patient was also instructed that they will need to review over the PAT instructions again at home before surgery.

## 2022-05-04 NOTE — Patient Instructions (Addendum)
SURGICAL WAITING ROOM VISITATION Patients having surgery or a procedure may have no more than 2 support people in the waiting area - these visitors may rotate.   Children under the age of 68 must have an adult with them who is not the patient. If the patient needs to stay at the hospital during part of their recovery, the visitor guidelines for inpatient rooms apply. Pre-op nurse will coordinate an appropriate time for 1 support person to accompany patient in pre-op.  This support person may not rotate.    Please refer to the [facility] website for the visitor guidelines for Inpatients (after your surgery is over and you are in a regular room).    Your procedure is scheduled on: 05/19/22   Report to [facility] Main Entrance    Report to admitting at 8:15 AM   Call this number if you have problems the morning of surgery [phone]   Do not eat food :After Midnight.   After Midnight you may have the following liquids until 7:30 AM DAY OF SURGERY  Water Non-Citrus Juices (without pulp, NO RED) Carbonated Beverages Black Coffee (NO MILK/CREAM OR CREAMERS, sugar ok)  Clear Tea (NO MILK/CREAM OR CREAMERS, sugar ok) regular and decaf                             Plain Jell-O (NO RED)                                           Fruit ices (not with fruit pulp, NO RED)                                     Popsicles (NO RED)                                                               Sports drinks like Gatorade (NO RED)                     If you have questions, please contact your surgeon's office.   FOLLOW BOWEL PREP AND ANY ADDITIONAL PRE OP INSTRUCTIONS YOU RECEIVED FROM YOUR SURGEON'S OFFICE!!!     Oral Hygiene is also important to reduce your risk of infection.                                    Remember - BRUSH YOUR TEETH THE MORNING OF SURGERY WITH YOUR REGULAR TOOTHPASTE   Take these medicines the morning of surgery with A SIP OF WATER: Tylenol, Carvedilol, Gabapentin,  Macrobid, Zofran, Oxycodone, Protonix, Pravastatin, Compazine   These are anesthesia recommendations for holding your anticoagulants.  Please contact your prescribing physician to confirm IF it is safe to hold your anticoagulants for this length of time.   Eliquis Apixaban   72 hours   Xarelto Rivaroxaban   72 hours  Plavix Clopidogrel   120 hours  Pletal Cilostazol   120 hours     DO  NOT TAKE ANY ORAL DIABETIC MEDICATIONS DAY OF YOUR SURGERY  How to Manage Your Diabetes Before and After Surgery  Why is it important to control my blood sugar before and after surgery? Improving blood sugar levels before and after surgery helps healing and can limit problems. A way of improving blood sugar control is eating a healthy diet by:  Eating less sugar and carbohydrates  Increasing activity/exercise  Talking with your doctor about reaching your blood sugar goals High blood sugars (greater than 180 mg/dL) can raise your risk of infections and slow your recovery, so you will need to focus on controlling your diabetes during the weeks before surgery. Make sure that the doctor who takes care of your diabetes knows about your planned surgery including the date and location.  How do I manage my blood sugar before surgery? Check your blood sugar at least 4 times a day, starting 2 days before surgery, to make sure that the level is not too high or low. Check your blood sugar the morning of your surgery when you wake up and every 2 hours until you get to the Short Stay unit. If your blood sugar is less than 70 mg/dL, you will need to treat for low blood sugar: Do not take insulin. Treat a low blood sugar (less than 70 mg/dL) with  cup of clear juice (cranberry or apple), 4 glucose tablets, OR glucose gel. Recheck blood sugar in 15 minutes after treatment (to make sure it is greater than 70 mg/dL). If your blood sugar is not greater than 70 mg/dL on recheck, call [phone] for further  instructions. Report your blood sugar to the short stay nurse when you get to Short Stay.  If you are admitted to the hospital after surgery: Your blood sugar will be checked by the staff and you will probably be given insulin after surgery (instead of oral diabetes medicines) to make sure you have good blood sugar levels. The goal for blood sugar control after surgery is 80-180 mg/dL.   WHAT DO I DO ABOUT MY DIABETES MEDICATION?  Do not take oral diabetes medicines (pills) the morning of surgery.  THE DAY BEFORE SURGERY, take morning dose of Levemir as prescribed, take 50% of evening dose. Take morning and afternoon dose of Novolog as prescribed, take 70% of evening dose.     THE MORNING OF SURGERY, take 50% of Levemir. Do not take Novolog.  DO NOT TAKE THE FOLLOWING 7 DAYS PRIOR TO SURGERY: Ozempic, Wegovy, Rybelsus (Semaglutide), Byetta (exenatide), Bydureon (exenatide ER), Victoza, Saxenda (liraglutide), or Trulicity (dulaglutide) Mounjaro (Tirzepatide) Adlyxin (Lixisenatide), Polyethylene Glycol Loxenatide.  Reviewed and Endorsed by [facility] Patient Education Committee, August 2015                               You may not have any metal on your body including hair pins, jewelry, and body piercing             Do not wear make-up, lotions, powders, perfumes, or deodorant  Do not wear nail polish including gel and S&S, artificial/acrylic nails, or any other type of covering on natural nails including finger and toenails. If you have artificial nails, gel coating, etc. that needs to be removed by a nail salon please have this removed prior to surgery or surgery may need to be canceled/ delayed if the surgeon/ anesthesia feels like they are unable to be safely monitored.   Do not  shave  48 hours prior to surgery.   Do not bring valuables to the hospital. [place].  DO NOT [place]. PHARMACY WILL  DISPENSE MEDICATIONS LISTED ON YOUR MEDICATION LIST TO YOU DURING YOUR ADMISSION [place]!    Patients discharged on the day of surgery will not be allowed to drive home.  Someone NEEDS to stay with you for the first 24 hours after anesthesia.   Special Instructions: Bring a copy of your healthcare power of attorney and living will documents the day of surgery if you haven't scanned them before.              Please read over the following fact sheets you were given: IF [place] [phone] Gomez   If you received a COVID test during your pre-op visit  it is requested that you wear a mask when out in public, stay away from anyone that may not be feeling well and notify your surgeon if you develop symptoms. If you test positive for Covid or have been in contact with anyone that has tested positive in the last 10 days please notify you surgeon.     [place] - Preparing for Surgery Before surgery, you can play an important role.  Because skin is not sterile, your skin needs to be as free of germs as possible.  You can reduce the number of germs on your skin by washing with CHG (chlorahexidine gluconate) soap before surgery.  CHG is an antiseptic cleaner which kills germs and bonds with the skin to continue killing germs even after washing. Please DO NOT use if you have an allergy to CHG or antibacterial soaps.  If your skin becomes reddened/irritated stop using the CHG and inform your nurse when you arrive at Short Stay. Do not shave (including legs and underarms) for at least 48 hours prior to the first CHG shower.  You may shave your face/neck.  Please follow these instructions carefully:  1.  Shower with CHG Soap the night before surgery and the  morning of surgery.  2.  If you choose to wash your hair, wash your hair first as usual with your normal  shampoo.  3.  After you shampoo, rinse your hair and body thoroughly to remove the  shampoo.                             4.  Use CHG as you would any other liquid soap.  You can apply chg directly to the skin and wash.  Gently with a scrungie or clean washcloth.  5.  Apply the CHG Soap to your body ONLY FROM THE NECK DOWN.   Do   not use on face/ open                           Wound or open sores. Avoid contact with eyes, ears mouth and   genitals (private parts).                       Wash face,  Genitals (private parts) with your normal soap.             6.  Wash thoroughly, paying special attention to the  area where your    surgery  will be performed.  7.  Thoroughly rinse your body with warm water from the neck down.  8.  DO NOT shower/wash with your normal soap after using and rinsing off the CHG Soap.                9.  Pat yourself dry with a clean towel.            10.  Wear clean pajamas.            11.  Place clean sheets on your bed the night of your first shower and do not  sleep with pets. Day of Surgery : Do not apply any lotions/deodorants the morning of surgery.  Please wear clean clothes to the hospital/surgery center.  FAILURE TO FOLLOW THESE INSTRUCTIONS MAY RESULT IN THE CANCELLATION OF YOUR SURGERY  PATIENT SIGNATURE_________________________________  NURSE SIGNATURE__________________________________  ________________________________________________________________________

## 2022-05-04 NOTE — Progress Notes (Addendum)
COVID Vaccine Completed: yes  Date of COVID positive in last 90 days: no  PCP - Sandi Raveling, FNP Cardiologist - n/a  Chest x-ray - 09/01/21 Epic EKG - 09/01/21 Epic Stress Test - n/a ECHO - 2 years ago per pt in [facility], pt reports okay Cardiac Cath - n/a Pacemaker/ICD device last checked: n/a Spinal Cord Stimulator: n/a  Bowel Prep - no  Sleep Study - yes, negative CPAP -   Fasting Blood Sugar - 150 Checks Blood Sugar 1 times a day   Blood Thinner Instructions: Arixtra injection. Will call for instructions. No longer  on Eliquis, stopped when started Arixtra Aspirin Instructions: Last Dose:  Activity level: Can go up a flight of stairs and perform activities of daily living without stopping and without symptoms of chest pain or shortness of breath.    Anesthesia review: has port, endometrial cancer, HTN, DM2, creatinine 2.93  Patient denies shortness of breath, fever, cough and chest pain at PAT appointment  Patient verbalized understanding of instructions that were given to them at the PAT appointment. Patient was also instructed that they will need to review over the PAT instructions again at home before surgery.

## 2022-05-05 ENCOUNTER — Encounter (HOSPITAL_COMMUNITY)
Admission: RE | Admit: 2022-05-05 | Discharge: 2022-05-05 | Disposition: A | Discharge status: Home / Self Care | Payer: BC Managed Care – PPO | Source: Ambulatory Visit | Attending: Urology | Admitting: Urology

## 2022-05-05 ENCOUNTER — Encounter (HOSPITAL_COMMUNITY): Payer: Self-pay

## 2022-05-05 VITALS — BP 152/79 | HR 66 | Temp 98.6°F | Resp 12 | Ht 65.0 in | Wt 152.0 lb

## 2022-05-05 DIAGNOSIS — Z01812 Encounter for preprocedural laboratory examination: Secondary | ICD-10-CM | POA: Diagnosis not present

## 2022-05-05 DIAGNOSIS — E119 Type 2 diabetes mellitus without complications: Secondary | ICD-10-CM | POA: Diagnosis not present

## 2022-05-05 DIAGNOSIS — I10 Essential (primary) hypertension: Secondary | ICD-10-CM

## 2022-05-05 DIAGNOSIS — Z794 Long term (current) use of insulin: Secondary | ICD-10-CM | POA: Diagnosis not present

## 2022-05-05 HISTORY — DX: Pneumonia, unspecified organism: J18.9

## 2022-05-05 LAB — BASIC METABOLIC PANEL
Anion gap: 9 (ref 5–15)
BUN: 57 mg/dL — ABNORMAL HIGH (ref 6–20)
CO2: 20 mmol/L — ABNORMAL LOW (ref 22–32)
Calcium: 9.8 mg/dL (ref 8.9–10.3)
Chloride: 107 mmol/L (ref 98–111)
Creatinine, Ser: 2.93 mg/dL — ABNORMAL HIGH (ref 0.44–1.00)
GFR, Estimated: 18 mL/min — ABNORMAL LOW (ref >60)
Glucose, Bld: 183 mg/dL — ABNORMAL HIGH (ref 70–99)
Potassium: 4.4 mmol/L (ref 3.5–5.1)
Sodium: 136 mmol/L (ref 135–145)

## 2022-05-05 LAB — HEMOGLOBIN A1C
Hgb A1c MFr Bld: 7.1 % — ABNORMAL HIGH (ref 4.8–5.6)
Mean Plasma Glucose: 157.07 mg/dL

## 2022-05-05 LAB — CBC
HCT: 35.8 % — ABNORMAL LOW (ref 36.0–46.0)
Hemoglobin: 11 g/dL — ABNORMAL LOW (ref 12.0–15.0)
MCH: 28.6 pg (ref 26.0–34.0)
MCHC: 30.7 g/dL (ref 30.0–36.0)
MCV: 93 fL (ref 80.0–100.0)
Platelets: 267 10*3/uL (ref 150–400)
RBC: 3.85 MIL/uL — ABNORMAL LOW (ref 3.87–5.11)
RDW: 16 % — ABNORMAL HIGH (ref 11.5–15.5)
WBC: 6.5 10*3/uL (ref 4.0–10.5)
nRBC: 0 % (ref 0.0–0.2)

## 2022-05-05 LAB — GLUCOSE, CAPILLARY: Glucose-Capillary: 149 mg/dL — ABNORMAL HIGH (ref 70–99)

## 2022-05-05 NOTE — Progress Notes (Signed)
Creatinine 2.93, results routed to Dr. Abner Greenspan

## 2022-05-08 ENCOUNTER — Other Ambulatory Visit: Payer: Self-pay | Admitting: *Deleted

## 2022-05-08 DIAGNOSIS — C541 Malignant neoplasm of endometrium: Secondary | ICD-10-CM

## 2022-05-08 MED ORDER — OXYCODONE HCL 5 MG PO CAPS: 5.0000 mg | ORAL_CAPSULE | ORAL | 0 refills | Status: DC | PRN

## 2022-05-19 ENCOUNTER — Other Ambulatory Visit: Payer: Self-pay

## 2022-05-19 ENCOUNTER — Ambulatory Visit (HOSPITAL_COMMUNITY): Payer: BC Managed Care – PPO | Admitting: Physician Assistant

## 2022-05-19 ENCOUNTER — Ambulatory Visit (HOSPITAL_COMMUNITY): Payer: BC Managed Care – PPO | Admitting: Certified Registered Nurse Anesthetist

## 2022-05-19 ENCOUNTER — Ambulatory Visit (HOSPITAL_COMMUNITY): Payer: BC Managed Care – PPO

## 2022-05-19 ENCOUNTER — Encounter (HOSPITAL_COMMUNITY): Payer: Self-pay | Admitting: Urology

## 2022-05-19 ENCOUNTER — Ambulatory Visit (HOSPITAL_COMMUNITY)
Admission: RE | Admit: 2022-05-19 | Discharge: 2022-05-19 | Disposition: A | Discharge status: Home / Self Care | Payer: BC Managed Care – PPO | Source: Ambulatory Visit | Attending: Urology | Admitting: Urology

## 2022-05-19 ENCOUNTER — Encounter (HOSPITAL_COMMUNITY)
Admission: RE | Disposition: A | Discharge status: Home / Self Care | Payer: Self-pay | Source: Ambulatory Visit | Attending: Urology

## 2022-05-19 DIAGNOSIS — Z9221 Personal history of antineoplastic chemotherapy: Secondary | ICD-10-CM | POA: Insufficient documentation

## 2022-05-19 DIAGNOSIS — N132 Hydronephrosis with renal and ureteral calculous obstruction: Secondary | ICD-10-CM | POA: Diagnosis present

## 2022-05-19 DIAGNOSIS — E1122 Type 2 diabetes mellitus with diabetic chronic kidney disease: Secondary | ICD-10-CM | POA: Insufficient documentation

## 2022-05-19 DIAGNOSIS — I129 Hypertensive chronic kidney disease with stage 1 through stage 4 chronic kidney disease, or unspecified chronic kidney disease: Secondary | ICD-10-CM | POA: Diagnosis not present

## 2022-05-19 DIAGNOSIS — Z923 Personal history of irradiation: Secondary | ICD-10-CM | POA: Diagnosis not present

## 2022-05-19 DIAGNOSIS — N189 Chronic kidney disease, unspecified: Secondary | ICD-10-CM | POA: Insufficient documentation

## 2022-05-19 DIAGNOSIS — Z8542 Personal history of malignant neoplasm of other parts of uterus: Secondary | ICD-10-CM | POA: Diagnosis not present

## 2022-05-19 DIAGNOSIS — Z794 Long term (current) use of insulin: Secondary | ICD-10-CM | POA: Diagnosis not present

## 2022-05-19 DIAGNOSIS — N131 Hydronephrosis with ureteral stricture, not elsewhere classified: Secondary | ICD-10-CM

## 2022-05-19 DIAGNOSIS — N1831 Chronic kidney disease, stage 3a: Secondary | ICD-10-CM

## 2022-05-19 HISTORY — PX: CYSTOSCOPY WITH STENT PLACEMENT: SHX5790

## 2022-05-19 LAB — GLUCOSE, CAPILLARY: Glucose-Capillary: 98 mg/dL (ref 70–99)

## 2022-05-19 SURGERY — CYSTOSCOPY, WITH STENT INSERTION
Anesthesia: General | Laterality: Right

## 2022-05-19 MED ORDER — MIDAZOLAM HCL 2 MG/2ML IJ SOLN
INTRAMUSCULAR | Status: DC | PRN
  Administered 2022-05-19: 2 mg via INTRAVENOUS

## 2022-05-19 MED ORDER — CHLORHEXIDINE GLUCONATE 0.12 % MT SOLN
15.0000 mL | Freq: Once | OROMUCOSAL | Status: AC
  Administered 2022-05-19: 15 mL via OROMUCOSAL

## 2022-05-19 MED ORDER — ONDANSETRON HCL 4 MG/2ML IJ SOLN
INTRAMUSCULAR | Status: AC
  Filled 2022-05-19: qty 2

## 2022-05-19 MED ORDER — ACETAMINOPHEN 160 MG/5ML PO SOLN: 325.0000 mg | ORAL | Status: DC | PRN

## 2022-05-19 MED ORDER — PROMETHAZINE HCL 25 MG/ML IJ SOLN: 6.2500 mg | INTRAMUSCULAR | Status: DC | PRN

## 2022-05-19 MED ORDER — PROPOFOL 10 MG/ML IV BOLUS
INTRAVENOUS | Status: DC | PRN
  Administered 2022-05-19: 150 mg via INTRAVENOUS

## 2022-05-19 MED ORDER — SODIUM CHLORIDE 0.9 % IR SOLN
Status: DC | PRN
  Administered 2022-05-19: 3000 mL via INTRAVESICAL

## 2022-05-19 MED ORDER — FENTANYL CITRATE (PF) 100 MCG/2ML IJ SOLN
INTRAMUSCULAR | Status: DC | PRN
  Administered 2022-05-19 (×2): 50 ug via INTRAVENOUS

## 2022-05-19 MED ORDER — FUROSEMIDE 10 MG/ML IJ SOLN
INTRAMUSCULAR | Status: AC
  Filled 2022-05-19: qty 4

## 2022-05-19 MED ORDER — FENTANYL CITRATE (PF) 100 MCG/2ML IJ SOLN
INTRAMUSCULAR | Status: AC
  Filled 2022-05-19: qty 2

## 2022-05-19 MED ORDER — PROPOFOL 10 MG/ML IV BOLUS
INTRAVENOUS | Status: AC
  Filled 2022-05-19: qty 20

## 2022-05-19 MED ORDER — OXYBUTYNIN CHLORIDE 5 MG PO TABS: 5.0000 mg | ORAL_TABLET | Freq: Three times a day (TID) | ORAL | Status: DC

## 2022-05-19 MED ORDER — FUROSEMIDE 10 MG/ML IJ SOLN: 40.0000 mg | Freq: Once | INTRAMUSCULAR | Status: DC

## 2022-05-19 MED ORDER — AMISULPRIDE (ANTIEMETIC) 5 MG/2ML IV SOLN: 10.0000 mg | Freq: Once | INTRAVENOUS | Status: DC | PRN

## 2022-05-19 MED ORDER — LIDOCAINE 2% (20 MG/ML) 5 ML SYRINGE
INTRAMUSCULAR | Status: DC | PRN
  Administered 2022-05-19: 100 mg via INTRAVENOUS

## 2022-05-19 MED ORDER — ACETAMINOPHEN 10 MG/ML IV SOLN: 1000.0000 mg | Freq: Once | INTRAVENOUS | Status: DC | PRN

## 2022-05-19 MED ORDER — ACETAMINOPHEN 325 MG PO TABS: 325.0000 mg | ORAL_TABLET | ORAL | Status: DC | PRN

## 2022-05-19 MED ORDER — IOHEXOL 300 MG/ML  SOLN
INTRAMUSCULAR | Status: DC | PRN
  Administered 2022-05-19: 4 mL

## 2022-05-19 MED ORDER — PHENYLEPHRINE 80 MCG/ML (10ML) SYRINGE FOR IV PUSH (FOR BLOOD PRESSURE SUPPORT)
PREFILLED_SYRINGE | INTRAVENOUS | Status: DC | PRN
  Administered 2022-05-19: 120 ug via INTRAVENOUS

## 2022-05-19 MED ORDER — FENTANYL CITRATE PF 50 MCG/ML IJ SOSY
25.0000 ug | PREFILLED_SYRINGE | INTRAMUSCULAR | Status: DC | PRN
  Administered 2022-05-19: 50 ug via INTRAVENOUS

## 2022-05-19 MED ORDER — ACETAMINOPHEN 10 MG/ML IV SOLN
INTRAVENOUS | Status: AC
  Administered 2022-05-19: 1000 mg via INTRAVENOUS
  Filled 2022-05-19: qty 100

## 2022-05-19 MED ORDER — OXYCODONE HCL 5 MG PO TABS
ORAL_TABLET | ORAL | Status: AC
  Administered 2022-05-19: 5 mg via ORAL
  Filled 2022-05-19: qty 1

## 2022-05-19 MED ORDER — FENTANYL CITRATE PF 50 MCG/ML IJ SOSY
PREFILLED_SYRINGE | INTRAMUSCULAR | Status: AC
  Administered 2022-05-19: 50 ug via INTRAVENOUS
  Filled 2022-05-19: qty 1

## 2022-05-19 MED ORDER — CEFAZOLIN SODIUM-DEXTROSE 2-4 GM/100ML-% IV SOLN
2.0000 g | Freq: Once | INTRAVENOUS | Status: AC
  Administered 2022-05-19: 2 g via INTRAVENOUS
  Filled 2022-05-19: qty 100

## 2022-05-19 MED ORDER — DEXAMETHASONE SODIUM PHOSPHATE 10 MG/ML IJ SOLN
INTRAMUSCULAR | Status: AC
  Filled 2022-05-19: qty 1

## 2022-05-19 MED ORDER — SCOPOLAMINE 1 MG/3DAYS TD PT72
MEDICATED_PATCH | TRANSDERMAL | Status: DC | PRN
  Administered 2022-05-19: 1 via TRANSDERMAL

## 2022-05-19 MED ORDER — SCOPOLAMINE 1 MG/3DAYS TD PT72
MEDICATED_PATCH | TRANSDERMAL | Status: AC
  Filled 2022-05-19: qty 1

## 2022-05-19 MED ORDER — LIDOCAINE HCL (PF) 2 % IJ SOLN
INTRAMUSCULAR | Status: AC
  Filled 2022-05-19: qty 5

## 2022-05-19 MED ORDER — ONDANSETRON HCL 4 MG/2ML IJ SOLN
INTRAMUSCULAR | Status: DC | PRN
  Administered 2022-05-19: 4 mg via INTRAVENOUS

## 2022-05-19 MED ORDER — OXYCODONE HCL 5 MG/5ML PO SOLN: 5.0000 mg | Freq: Once | ORAL | Status: AC | PRN

## 2022-05-19 MED ORDER — LACTATED RINGERS IV SOLN: INTRAVENOUS | Status: DC

## 2022-05-19 MED ORDER — OXYBUTYNIN CHLORIDE ER 10 MG PO TB24: 10.0000 mg | ORAL_TABLET | Freq: Every day | ORAL | 6 refills | Status: DC

## 2022-05-19 MED ORDER — OXYCODONE HCL 5 MG PO TABS: 5.0000 mg | ORAL_TABLET | Freq: Once | ORAL | Status: AC | PRN

## 2022-05-19 MED ORDER — FENTANYL CITRATE PF 50 MCG/ML IJ SOSY
PREFILLED_SYRINGE | INTRAMUSCULAR | Status: AC
  Filled 2022-05-19: qty 1

## 2022-05-19 MED ORDER — OXYBUTYNIN CHLORIDE 5 MG PO TABS
ORAL_TABLET | ORAL | Status: AC
  Administered 2022-05-19: 5 mg via ORAL
  Filled 2022-05-19: qty 1

## 2022-05-19 MED ORDER — ORAL CARE MOUTH RINSE: 15.0000 mL | Freq: Once | OROMUCOSAL | Status: AC

## 2022-05-19 MED ORDER — FUROSEMIDE 10 MG/ML IJ SOLN
40.0000 mg | Freq: Once | INTRAMUSCULAR | Status: AC
  Administered 2022-05-19: 40 mg via INTRAVENOUS

## 2022-05-19 MED ORDER — MIDAZOLAM HCL 2 MG/2ML IJ SOLN
INTRAMUSCULAR | Status: AC
  Filled 2022-05-19: qty 2

## 2022-05-19 SURGICAL SUPPLY — 14 items
BAG URO CATCHER STRL LF (MISCELLANEOUS) ×1 IMPLANT
CATH URETL OPEN 5X70 (CATHETERS) IMPLANT
CLOTH BEACON ORANGE TIMEOUT ST (SAFETY) ×1 IMPLANT
GLOVE BIOGEL M 7.0 STRL (GLOVE) ×1 IMPLANT
GOWN STRL REUS W/ TWL LRG LVL3 (GOWN DISPOSABLE) ×1 IMPLANT
GOWN STRL REUS W/TWL LRG LVL3 (GOWN DISPOSABLE) ×1
GUIDEWIRE STR DUAL SENSOR (WIRE) ×1 IMPLANT
GUIDEWIRE ZIPWRE .038 STRAIGHT (WIRE) IMPLANT
MANIFOLD NEPTUNE II (INSTRUMENTS) ×1 IMPLANT
PACK CYSTO (CUSTOM PROCEDURE TRAY) ×1 IMPLANT
STENT URET 6FRX24 CONTOUR (STENTS) IMPLANT
SYR 10ML LL (SYRINGE) ×1 IMPLANT
TUBING CONNECTING 10 (TUBING) ×1 IMPLANT
TUBING UROLOGY SET (TUBING) IMPLANT

## 2022-05-19 NOTE — Transfer of Care (Signed)
Immediate Anesthesia Transfer of Care Note  Patient: Jaime Gomez  Procedure(s) Performed: CYSTOSCOPY WITH STENT CHANGE (Right)  Patient Location: PACU  Anesthesia Type:General  Level of Consciousness: awake and patient cooperative  Airway & Oxygen Therapy: Patient Spontanous Breathing and Patient connected to face mask  Post-op Assessment: Report given to RN and Post -op Vital signs reviewed and stable  Post vital signs: Reviewed and stable  Last Vitals:  Vitals Value Taken Time  BP 165/106 05/19/22 1354  Temp    Pulse 97 05/19/22 1356  Resp 11 05/19/22 1356  SpO2 100 % 05/19/22 1356  Vitals shown include unvalidated device data.  Last Pain:  Vitals:   05/19/22 1137  TempSrc: Oral  PainSc:          Complications: No notable events documented.

## 2022-05-19 NOTE — Anesthesia Procedure Notes (Signed)
Procedure Name: LMA Insertion Date/Time: 05/19/2022 1:25 PM  Performed by: Claudia Desanctis, CRNAPre-anesthesia Checklist: Emergency Drugs available, Patient identified, Suction available and Patient being monitored Patient Re-evaluated:Patient Re-evaluated prior to induction Oxygen Delivery Method: Circle system utilized Preoxygenation: Pre-oxygenation with 100% oxygen Induction Type: IV induction Ventilation: Mask ventilation without difficulty LMA: LMA inserted LMA Size: 4.0 Number of attempts: 1 Placement Confirmation: positive ETCO2 and breath sounds checked- equal and bilateral Tube secured with: Tape Dental Injury: Teeth and Oropharynx as per pre-operative assessment

## 2022-05-19 NOTE — Discharge Instructions (Addendum)
[facility] [phone] Post Stent Instructions  Definitions:  Ureter: The duct that transports urine from the kidney to the bladder. Stent:   A plastic hollow tube that is placed into the ureter, from the kidney to the bladder to prevent the ureter from swelling shut.  GENERAL INSTRUCTIONS:  Despite the fact that no skin incisions were used, the area around the ureter and bladder is raw and irritated. The stent is a foreign body which will further irritate the bladder wall. This irritation is manifested by increased frequency of urination, both day and night, and by an increase in the urge to urinate. In some, the urge to urinate is present almost always. Sometimes the urge is strong enough that you may not be able to stop yourself from urinating. The only real cure is to remove the stent and then give time for the bladder wall to heal which can't be done until the danger of the ureter swelling shut has passed, which varies.  You may see some blood in your urine while the stent is in place and a few days afterwards. Do not be alarmed, even if the urine was clear for a while. Get off your feet and drink lots of fluids until clearing occurs. If you start to pass clots or don't improve, call us.  DIET: You may return to your normal diet immediately. Because of the raw surface of your bladder, alcohol, spicy foods, acid type foods and drinks with caffeine may cause irritation or frequency and should be used in moderation. To keep your urine flowing freely and to avoid constipation, drink plenty of fluids during the day ( 8-10 glasses ). Tip: Avoid cranberry juice because it is very acidic.  ACTIVITY: Your physical activity doesn't need to be restricted. However, if you are very active, you may see some blood in your urine. We suggest that you reduce your activity under these circumstances until the bleeding has stopped.  BOWELS: It is important to keep your bowels regular during  the postoperative period. Straining with bowel movements can cause bleeding. A bowel movement every other day is reasonable. Use a mild laxative if needed, such as Milk of Magnesia 2-3 tablespoons, or 2 Dulcolax tablets. Call if you continue to have problems. If you have been taking narcotics for pain, before, during or after your surgery, you may be constipated. Take a laxative if necessary.   MEDICATION: You should resume your pre-surgery medications unless told not to. In addition you will often be given an antibiotic to prevent infection. These should be taken as prescribed until the bottles are finished unless you are having an unusual reaction to one of the drugs.  PROBLEMS YOU SHOULD REPORT TO US: Fevers over 100.5 Fahrenheit. Heavy bleeding, or clots ( See above notes about blood in urine ). Inability to urinate. Drug reactions ( hives, rash, nausea, vomiting, diarrhea ). Severe burning or pain with urination that is not improving.  FOLLOW-UP: You will need a follow-up appointment to monitor your progress. Call for this appointment at the number listed above. Usually the first appointment will be about three to fourteen days after your surgery.  

## 2022-05-19 NOTE — Progress Notes (Signed)
Pt report that she is having pain in her urethra area 7/10. She states that she has had this surgery before and that she did not have pain like this. Assisted pt to the restroom because she states that she needs to urinate. Pt unable to void at this time. Assisted pt back to recliner safely. PO pain medication given to pt. Pt HR and BP elevated at this time.

## 2022-05-19 NOTE — Anesthesia Preprocedure Evaluation (Addendum)
Anesthesia Evaluation  Patient identified by MRN, date of birth, ID band Patient awake    Reviewed: Allergy & Precautions, NPO status , Patient's Chart, lab work & pertinent test results  Airway Mallampati: II  TM Distance: >3 FB Neck ROM: Full    Dental  (+) Teeth Intact, Dental Advisory Given   Pulmonary    breath sounds clear to auscultation       Cardiovascular hypertension, Pt. on home beta blockers  Rhythm:Regular Rate:Normal     Neuro/Psych negative neurological ROS  negative psych ROS   GI/Hepatic Neg liver ROS, GERD  Medicated,  Endo/Other  diabetes, Type 2, Insulin Dependent  Renal/GU Renal disease     Musculoskeletal negative musculoskeletal ROS (+)   Abdominal Normal abdominal exam  (+)   Peds  Hematology negative hematology ROS (+)   Anesthesia Other Findings   Reproductive/Obstetrics                            Anesthesia Physical Anesthesia Plan  ASA: 2  Anesthesia Plan: General   Post-op Pain Management:    Induction: Intravenous  PONV Risk Score and Plan: 4 or greater and Ondansetron, Midazolam, Treatment may vary due to age or medical condition and Scopolamine patch - Pre-op  Airway Management Planned: LMA  Additional Equipment: None  Intra-op Plan:   Post-operative Plan: Extubation in OR  Informed Consent: I have reviewed the patients History and Physical, chart, labs and discussed the procedure including the risks, benefits and alternatives for the proposed anesthesia with the patient or authorized representative who has indicated his/her understanding and acceptance.     Dental advisory given  Plan Discussed with: CRNA  Anesthesia Plan Comments:        Anesthesia Quick Evaluation

## 2022-05-19 NOTE — Op Note (Signed)
Operative Note  Preoperative diagnosis:  1.  Bilateral hydronephrosis  Postoperative diagnosis: 1.  Bilateral hydronephrosis  Procedure(s): 1.  Cystoscopy 2. Right retrograde pyelogram with interpretation 3. Right ureteral stent exchange 4. Fluoroscopy <1 hour with intraoperative interpretation  Surgeon: Rexene Alberts, MD  Assistants:  None  Anesthesia:  General  Complications:  None  EBL:  Minimal  Specimens: 1. None  Drains/Catheters: 1.  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 Unable to identify the left ureteral orifice given the radiation cystitis.  Right retrograde pyelogram demonstrated mild right hydronephrosis. Successful right ureteral stent placement with curl in the renal pelvis and bladder respectively.   Indication:  Jaime Gomez is a 56 y.o. 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  Most of her initial treatment was performed in [place].  I placed a right ureteral stent in 08/2021 however was unable to identify the left ureter that time.  She subsequently underwent left nephrostomy tube placement.  Subsequently attempt by interventional radiology to internalize this demonstrated a distal left ureteral vaginal fistula.  She most recently underwent left PCN exchange on 01/20/2022.  She is here today for right ureteral stent exchange.  Description of procedure: The patient was taken to the operating room and general anesthesia was induced.  The patient was placed in the dorsal lithotomy position, prepped and draped in the usual sterile fashion, and preoperative antibiotics were administered. A preoperative time-out was performed.   Cystourethroscopy was performed.  The patient's urethra was examined and was normal.  Her bladder did have evidence of radiation  cystitis.  Her right ureteral stent was in place.  This was then grasped with a grasper and pulled out through the meatus.  Through the stent, placed a 0.038 sensor wire up to the right kidney.  I then remove the stent.  I then passed a 5 [place] open-ended catheter and performed a right retrograde pyelogram demonstrating mild right-sided hydronephrosis however no filling defects.  Then replaced the wire.  Over the wire, I placed a 6 [place] by 24 cm right ureteral stent.  I noted an adequate curl in the renal pelvis and the bladder.  The bladder was then emptied and the procedure ended.  The patient appeared to tolerate the procedure well and without complications.  The patient was able to be awakened and transferred to the recovery unit in satisfactory condition.   Plan: Discharge home.  Follow-up with me next week.  Matt R. [facility]  Pager: [phone]

## 2022-05-19 NOTE — OR Nursing (Signed)
Left Nephrostomy Tube bag changed by Dr. Abner Greenspan in the OR.

## 2022-05-19 NOTE — Progress Notes (Signed)
Dr Abner Greenspan at bedside

## 2022-05-19 NOTE — H&P (Signed)
Office Visit Report     03/06/2022    CC/HPI: Jaime Gomez is a 56 y.o. female who has a history of metastatic endometrial carcinoma and malignant hydronephrosis and left ureterovaginal fistula.   #1. Malignant bilateral hydronephrosis:  She relocated to [place] from [place] in January 2023. She was managed by Dr. Gita Kudo (urology) with malignant bilateral ureteral obstruction from her metastatic endometrial carcinoma.  -She was diagnosed with ovarian/uterine cancer in 2012. She underwent total hysterectomy with b/l PLND. She had adjuvant radiation. She had a quick recurrence and was treated with gemticabine/carboplatinum in 2012, again treated with chemotherapy in 2018 and placed on keytruda in 2019. She had progressive disease and received piqray until 2020. She again had radiation in 2021.  -She also has a history of chronic kidney diseae.  She states that she has had either bilateral ureteral stents or nephrostomy tubes since 2021. Her stents were removed in September 2022. Since then, she does complain of intermittent bilateral flank pain. After the stents were removed, she has suffered worsening chronic kidney disease. He presented the hospital on 09/01/2021 with acute renal failure with creatinine 8.45 from 5.7 a week prior. Per chart review, her creatinine was 3 in 10/2020 when she had stents in place.  -CT A/P 09/01/2021 demonstrated new left emphysematous pyelitis with unchanged moderate and mild left hydroureteronephrosis. She has punctate right nephrolithiasis and had thick walled decompressed bladder consistent with cystitis.  -S/p right stent placement 09/01/2021. 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  -S/p left nephrostomy tube 09/03/2021  -IR attempted to place L PCNU on 11/02/21 however identified a complete left distal ureteral obstruction and thus her left PCN was exchanged.  -IR attempted atain to place L PCNU on 01/20/2022  and identified a fistula from the distal left ureter to the vagina. She underwent L PCN exchange on 01/20/2022   2. Left ureterovaginal fistula:  -Pt complained of mild leakage per vagina since 10/2021 after there was intent to place a PCNU however a ureteral obstruction was not identified. She was confirmed to have a left ureterovaginal fistula on 01/20/2022 when IR attempted to place a she wears about 3 pads per day. She states most of her urine drains out of her left PCN.  -At this time, she understands that a reconstructive procedure would be fraught with complications given her progression of her metastatic disease.   She is following with Dr. Marin Olp outpatient to manage her metastatic endometrial carcinoma. Unfortunately, she has had some progression into her sacrum. She recently completed radiation treatment to her sacrum. She has upcoming PET scan later this month.     ALLERGIES: Docetaxel Anhydrous    MEDICATIONS: Atorvastatin Calcium  Eliquis  Everolimus  Folic Acid  Insulin Aspart  Letrozole  Magnesium  Miralax  Nitrofurantoin  Ondansetron Hcl  Oxycodone-Acetaminophen  Pravastatin Sodium     GU PSH: No GU PSH    NON-GU PSH: Hernia Repair - 2013 Hysterectomy - 2012 Remove Kidney Stone - 2019     GU PMH: Hydronephrosis - 01/03/2022, - 09/30/2021 Ovarian Cancer, Unspec Uterine Cancer, part Unspec    NON-GU PMH: Acute embolism and thrombosis of unspecified deep veins of left lower extremity    FAMILY HISTORY: Blood in the urine - Runs in Family Diabetes - Runs in Family   SOCIAL HISTORY: Marital Status: Single Ethnicity: Not Hispanic Or Latino; Race: White Current Smoking Status: Patient has never smoked.   Tobacco  Use Assessment Completed: Used Tobacco in last 30 days? Has never drank.  Drinks 1 caffeinated drink per day.    REVIEW OF SYSTEMS:    GU Review Female:   Patient denies frequent urination, hard to postpone urination, burning /pain with urination,  get up at night to urinate, leakage of urine, stream starts and stops, trouble starting your stream, have to strain to urinate, and being pregnant.  Gastrointestinal (Upper):   Patient denies nausea, vomiting, and indigestion/ heartburn.  Gastrointestinal (Lower):   Patient denies diarrhea and constipation.  Constitutional:   Patient denies fever, night sweats, weight loss, and fatigue.  Skin:   Patient denies skin rash/ lesion and itching.  Eyes:   Patient denies double vision and blurred vision.  Ears/ Nose/ Throat:   Patient denies sore throat and sinus problems.  Hematologic/Lymphatic:   Patient denies swollen glands and easy bruising.  Cardiovascular:   Patient denies leg swelling and chest pains.  Respiratory:   Patient denies cough and shortness of breath.  Endocrine:   Patient denies excessive thirst.  Musculoskeletal:   Patient denies back pain and joint pain.  Neurological:   Patient denies headaches and dizziness.  Psychologic:   Patient denies depression and anxiety.   VITAL SIGNS: None   MULTI-SYSTEM PHYSICAL EXAMINATION:    Constitutional: Well-nourished. No physical deformities. Normally developed. Good grooming.  Respiratory: No labored breathing, no use of accessory muscles.   Cardiovascular: Normal temperature, normal extremity pulses, no swelling, no varicosities.  Gastrointestinal: No mass, no tenderness, no rigidity, non obese abdomen.     Complexity of Data:  Source Of History:  Patient, Medical Record Summary  Records Review:   Previous Doctor Records, Previous Patient Records  Urine Test Review:   Urinalysis  X-Ray Review: C.T. Abdomen/Pelvis: Reviewed Films. Reviewed Report. Discussed With Patient.     PROCEDURES:          Urinalysis w/Scope Dipstick Dipstick Cont'd Micro  Color: Yellow Bilirubin: Neg mg/dL WBC/hpf: >60/hpf  Appearance: Slightly Cloudy Ketones: Neg mg/dL RBC/hpf: 3 - 10/hpf  Specific Gravity: 1.025 Blood: 3+ ery/uL Bacteria: Rare  (0-9/hpf)  pH: 6.0 Protein: 1+ mg/dL Cystals: NS (Not Seen)  Glucose: Neg mg/dL Urobilinogen: 0.2 mg/dL Casts: NS (Not Seen)    Nitrites: Neg Trichomonas: Not Present    Leukocyte Esterase: 3+ leu/uL Mucous: Not Present      Epithelial Cells: NS (Not Seen)      Yeast: NS (Not Seen)      Sperm: Not Present    Notes: Micro done unspun urine    ASSESSMENT:      ICD-10 Details  1 GU:   Hydronephrosis - N13.0   2   Oth Fistula Female urinary-genital Tract - N82.1    PLAN:           Document Letter(s):  Created for Patient: Clinical Summary         Notes:   1. Malignant bilateral hydronephrosis:  -S/p right stent placement 09/01/2021  -S/p left PCN 09/03/2021  -S/p L PCN exchange on 11/02/2021 (unable to place L PCNU given complete left distal ureteral obstruction)/ S/p L PCN exchange on 01/20/2022 that identified left ureterovaginal fistula.  -Surgery letter sent for right stent exchange  -Will plan for L PCN exchange in a few months.  -We discussed that unfortunately given her progressive metastatic disease, I do not recommend a ureteral reimplantation given that risks likely outweigh the benefit.   2. Left ureterovaginal fistula:  -She was confirmed to have a  left ureterovaginal fistula on 01/20/2022  -We discussed that unfortunately given her progressive metastatic disease, I do not recommend a ureteral reimplantation given that risks likely outweigh the benefit.  -Will plan for L PCN exchange as above.   Urology Preoperative H&P   Chief Complaint: Bilateral hyrdonephrosis  History of Present Illness: Jaime Gomez is a 56 y.o. female with bilateral hydronephrosis managed with left PCN and right ureteral stent here for right ureteral stent exchange.  Denies fevers, chills, dysuria.    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 failure 08/18/2021   Diabetes mellitus without complication (HCC)    Endometrial cancer, FIGO stage IVB ([place]) 08/18/2021   Goals  of care, counseling/discussion 08/18/2021   History of kidney stones    Hypertension    Leg DVT (deep venous thromboembolism), chronic, right ([place]) 08/18/2021   Pneumonia     Past Surgical History:  Procedure Laterality Date   ABDOMINAL HYSTERECTOMY     CYSTOSCOPY W/ URETERAL STENT PLACEMENT Bilateral 09/01/2021   Procedure: CYSTOSCOPY WITH RETROGRADE PYELOGRAM/URETERAL STENT PLACEMENT;  Surgeon: Janith Lima, MD;  Location: [facility];  Service: Urology;  Laterality: Bilateral;   HERNIA REPAIR     IR CONVERT LEFT NEPHROSTOMY TO NEPHROURETERAL CATH  11/02/2021   IR CV LINE INJECTION  08/23/2021   IR IMAGING GUIDED PORT INSERTION  11/14/2021   IR NEPHROSTOMY EXCHANGE LEFT  11/02/2021   IR NEPHROSTOMY EXCHANGE LEFT  01/20/2022   IR NEPHROSTOMY PLACEMENT LEFT  09/03/2021   IR REMOVAL TUN ACCESS W/ PORT W/O FL MOD SED  11/14/2021    Allergies:  Allergies  Allergen Reactions   Taxotere [Docetaxel]     Throat swoll    Family History  Problem Relation Age of Onset   Cancer Maternal Grandmother     Social History:  reports that she has never smoked. She has never used smokeless tobacco. She reports that she does not currently use alcohol. She reports that she does not use drugs.  ROS: A complete review of systems was performed.  All systems are negative except for pertinent findings as noted.  Physical Exam:  Vital signs in last 24 hours:   Constitutional:  Alert and oriented, No acute distress Cardiovascular: Regular rate and rhythm Respiratory: Normal respiratory effort, Lungs clear bilaterally GI: Abdomen is soft, nontender, nondistended, no abdominal masses GU: No CVA tenderness Lymphatic: No lymphadenopathy Neurologic: Grossly intact, no focal deficits Psychiatric: Normal mood and affect  Laboratory Data:  No results for input(s): "WBC", "HGB", "HCT", "PLT" in the last 72 hours.  No results for input(s): "NA", "K", "CL", "GLUCOSE", "BUN", "CALCIUM", "CREATININE" in the last  72 hours.  Invalid input(s): "CO3"   No results found for this or any previous visit (from the past 24 hour(s)). No results found for this or any previous visit (from the past 240 hour(s)).  Renal Function: No results for input(s): "CREATININE" in the last 168 hours. CrCl cannot be calculated (Unknown ideal weight.).  Radiologic Imaging: No results found.  I independently reviewed the above imaging studies.  Assessment and Plan Jaime Gomez is a 56 y.o. female with  bilateral hydronephrosis managed with left PCN and right ureteral stent here for right ureteral stent exchange.  Denies fevers, chills, dysuria.  -The risks, benefits and alternatives of cystoscopy with right JJ stent exchange was discussed with the patient.  Risks include, but are not limited to: bleeding, urinary tract infection, ureteral injury, ureteral stricture disease, chronic pain, urinary symptoms, bladder  injury, stent migration, the need for nephrostomy tube placement, MI, CVA, DVT, PE and the inherent risks with general anesthesia.  The patient voices understanding and wishes to proceed.       Matt R. Tanish Sinkler MD 05/19/2022, 11:21 AM  [facility] Pager: [phone]): [phone]

## 2022-05-19 NOTE — Anesthesia Postprocedure Evaluation (Signed)
Anesthesia Post Note  Patient: Jaime Gomez  Procedure(s) Performed: CYSTOSCOPY WITH STENT CHANGE (Right)     Patient location during evaluation: PACU Anesthesia Type: General Level of consciousness: awake and alert Pain management: pain level controlled Vital Signs Assessment: post-procedure vital signs reviewed and stable Respiratory status: spontaneous breathing, nonlabored ventilation, respiratory function stable and patient connected to nasal cannula oxygen Cardiovascular status: blood pressure returned to baseline and stable Postop Assessment: no apparent nausea or vomiting Anesthetic complications: no   No notable events documented.  Last Vitals:  Vitals:   05/19/22 1458 05/19/22 1600  BP: (!) 178/115 (!) 164/100  Pulse:  100  Resp:  15  Temp:    SpO2:  98%    Last Pain:  Vitals:   05/19/22 1600  TempSrc:   PainSc: [place] Natisha Trzcinski

## 2022-05-20 ENCOUNTER — Encounter (HOSPITAL_COMMUNITY): Payer: Self-pay | Admitting: Urology

## 2022-05-30 ENCOUNTER — Other Ambulatory Visit (HOSPITAL_COMMUNITY): Payer: Self-pay | Admitting: Urology

## 2022-05-30 DIAGNOSIS — N1339 Other hydronephrosis: Secondary | ICD-10-CM

## 2022-06-07 ENCOUNTER — Encounter (HOSPITAL_COMMUNITY): Payer: BC Managed Care – PPO

## 2022-06-08 ENCOUNTER — Inpatient Hospital Stay: Payer: 59

## 2022-06-08 ENCOUNTER — Inpatient Hospital Stay: Payer: 59 | Admitting: Hematology & Oncology

## 2022-06-09 ENCOUNTER — Other Ambulatory Visit: Payer: Self-pay | Admitting: *Deleted

## 2022-06-09 DIAGNOSIS — C541 Malignant neoplasm of endometrium: Secondary | ICD-10-CM

## 2022-06-09 MED ORDER — OXYCODONE HCL 5 MG PO CAPS: 5.0000 mg | ORAL_CAPSULE | ORAL | 0 refills | Status: DC | PRN

## 2022-06-26 ENCOUNTER — Inpatient Hospital Stay: Payer: 59

## 2022-06-26 ENCOUNTER — Inpatient Hospital Stay: Payer: 59 | Admitting: Hematology & Oncology

## 2022-06-26 ENCOUNTER — Encounter: Payer: Self-pay | Admitting: Family

## 2022-06-26 NOTE — Progress Notes (Signed)
Pt here today with concerns regarding financial and recent insurance changes. Pt unable to continue with appointments at this time due to insurance not covering her care and office visits/appointments. [employer] here to s/w patient regarding her concerns and options going forward.  Pt requesting to cancel appts today due to insurance coverage.

## 2022-06-27 ENCOUNTER — Inpatient Hospital Stay: Payer: 59 | Attending: Hematology & Oncology | Admitting: Licensed Clinical Social Worker

## 2022-06-27 ENCOUNTER — Encounter: Payer: Self-pay | Admitting: Family

## 2022-06-27 NOTE — Progress Notes (Signed)
Jaime Gomez  Clinical Social Gomez was referred by nurse for assessment of psychosocial needs.  Clinical Social Worker contacted patient by phone  to offer support and assess for needs.    Referral was made due to patient stating her insurance had been cancelled.  She said she was able to speak with a representative at [employer] and the information given to her was incorrect.  She currently does have health insurance and she is going to contact the [place] today to make an appointment.  She reported she has all of her needs met currently.  Her stress level decreased significantly after she confirmed her insurance status.  Encouraged patient to contact CSW with any future needs.     Margaree Mackintosh, LCSW  Clinical Social Worker [facility]

## 2022-07-05 ENCOUNTER — Other Ambulatory Visit: Payer: Self-pay

## 2022-07-05 DIAGNOSIS — C541 Malignant neoplasm of endometrium: Secondary | ICD-10-CM

## 2022-07-05 MED ORDER — OXYCODONE HCL 5 MG PO CAPS: 5.0000 mg | ORAL_CAPSULE | ORAL | 0 refills | Status: DC | PRN

## 2022-08-08 ENCOUNTER — Other Ambulatory Visit: Payer: Self-pay

## 2022-08-08 DIAGNOSIS — C541 Malignant neoplasm of endometrium: Secondary | ICD-10-CM

## 2022-08-08 MED ORDER — OXYCODONE HCL 5 MG PO CAPS: 5.0000 mg | ORAL_CAPSULE | ORAL | 0 refills | Status: DC | PRN

## 2022-08-21 ENCOUNTER — Ambulatory Visit (HOSPITAL_COMMUNITY)
Admission: RE | Admit: 2022-08-21 | Discharge: 2022-08-21 | Disposition: A | Discharge status: Home / Self Care | Payer: 59 | Source: Ambulatory Visit | Attending: Hematology & Oncology | Admitting: Hematology & Oncology

## 2022-08-21 DIAGNOSIS — C541 Malignant neoplasm of endometrium: Secondary | ICD-10-CM | POA: Insufficient documentation

## 2022-08-21 LAB — GLUCOSE, CAPILLARY: Glucose-Capillary: 96 mg/dL (ref 70–99)

## 2022-08-21 MED ORDER — FLUDEOXYGLUCOSE F - 18 (FDG) INJECTION
7.5000 | Freq: Once | INTRAVENOUS | Status: AC
  Administered 2022-08-21: 7.5 via INTRAVENOUS

## 2022-08-29 ENCOUNTER — Other Ambulatory Visit: Payer: Self-pay

## 2022-08-29 DIAGNOSIS — C530 Malignant neoplasm of endocervix: Secondary | ICD-10-CM

## 2022-08-30 ENCOUNTER — Other Ambulatory Visit: Payer: Self-pay

## 2022-08-30 ENCOUNTER — Encounter: Payer: Self-pay | Admitting: Hematology & Oncology

## 2022-08-30 ENCOUNTER — Inpatient Hospital Stay: Payer: 59 | Attending: Hematology & Oncology

## 2022-08-30 ENCOUNTER — Telehealth: Payer: Self-pay | Admitting: *Deleted

## 2022-08-30 ENCOUNTER — Inpatient Hospital Stay: Payer: 59

## 2022-08-30 ENCOUNTER — Other Ambulatory Visit: Payer: Self-pay | Admitting: *Deleted

## 2022-08-30 ENCOUNTER — Inpatient Hospital Stay: Payer: 59 | Admitting: Hematology & Oncology

## 2022-08-30 VITALS — BP 138/60 | HR 71 | Temp 98.2°F | Resp 18 | Ht 65.0 in | Wt 149.0 lb

## 2022-08-30 DIAGNOSIS — C541 Malignant neoplasm of endometrium: Secondary | ICD-10-CM | POA: Diagnosis not present

## 2022-08-30 DIAGNOSIS — D631 Anemia in chronic kidney disease: Secondary | ICD-10-CM | POA: Diagnosis not present

## 2022-08-30 DIAGNOSIS — N189 Chronic kidney disease, unspecified: Secondary | ICD-10-CM

## 2022-08-30 DIAGNOSIS — C530 Malignant neoplasm of endocervix: Secondary | ICD-10-CM

## 2022-08-30 DIAGNOSIS — D649 Anemia, unspecified: Secondary | ICD-10-CM | POA: Diagnosis not present

## 2022-08-30 LAB — CMP (CANCER CENTER ONLY)
ALT: <5 U/L (ref 0–44)
AST: 11 U/L — ABNORMAL LOW (ref 15–41)
Albumin: 3.8 g/dL (ref 3.5–5.0)
Alkaline Phosphatase: 79 U/L (ref 38–126)
Anion gap: 13 (ref 5–15)
BUN: 40 mg/dL — ABNORMAL HIGH (ref 6–20)
CO2: 16 mmol/L — ABNORMAL LOW (ref 22–32)
Calcium: 9.4 mg/dL (ref 8.9–10.3)
Chloride: 109 mmol/L (ref 98–111)
Creatinine: 3.37 mg/dL (ref 0.44–1.00)
GFR, Estimated: 15 mL/min — ABNORMAL LOW (ref >60)
Glucose, Bld: 109 mg/dL — ABNORMAL HIGH (ref 70–99)
Potassium: 3.8 mmol/L (ref 3.5–5.1)
Sodium: 138 mmol/L (ref 135–145)
Total Bilirubin: 0.3 mg/dL (ref 0.3–1.2)
Total Protein: 7.4 g/dL (ref 6.5–8.1)

## 2022-08-30 LAB — CBC WITH DIFFERENTIAL (CANCER CENTER ONLY)
Abs Immature Granulocytes: 0.01 10*3/uL (ref 0.00–0.07)
Basophils Absolute: 0 10*3/uL (ref 0.0–0.1)
Basophils Relative: 0 %
Eosinophils Absolute: 0.1 10*3/uL (ref 0.0–0.5)
Eosinophils Relative: 4 %
HCT: 19 % — ABNORMAL LOW (ref 36.0–46.0)
Hemoglobin: 5.8 g/dL — CL (ref 12.0–15.0)
Immature Granulocytes: 0 %
Lymphocytes Relative: 25 %
Lymphs Abs: 0.9 10*3/uL (ref 0.7–4.0)
MCH: 26.6 pg (ref 26.0–34.0)
MCHC: 30.5 g/dL (ref 30.0–36.0)
MCV: 87.2 fL (ref 80.0–100.0)
Monocytes Absolute: 0.2 10*3/uL (ref 0.1–1.0)
Monocytes Relative: 5 %
Neutro Abs: 2.2 10*3/uL (ref 1.7–7.7)
Neutrophils Relative %: 66 %
Platelet Count: 168 10*3/uL (ref 150–400)
RBC: 2.18 MIL/uL — ABNORMAL LOW (ref 3.87–5.11)
RDW: 15.2 % (ref 11.5–15.5)
WBC Count: 3.4 10*3/uL — ABNORMAL LOW (ref 4.0–10.5)
nRBC: 0 % (ref 0.0–0.2)

## 2022-08-30 LAB — RETICULOCYTES
Immature Retic Fract: 12.3 % (ref 2.3–15.9)
RBC.: 2.17 MIL/uL — ABNORMAL LOW (ref 3.87–5.11)
Retic Count, Absolute: 23 10*3/uL (ref 19.0–186.0)
Retic Ct Pct: 1.1 % (ref 0.4–3.1)

## 2022-08-30 LAB — PREPARE RBC (CROSSMATCH)

## 2022-08-30 LAB — SAMPLE TO BLOOD BANK

## 2022-08-30 LAB — FERRITIN: Ferritin: 1727 ng/mL — ABNORMAL HIGH (ref 11–307)

## 2022-08-30 MED ORDER — HEPARIN SOD (PORK) LOCK FLUSH 100 UNIT/ML IV SOLN: 250.0000 [IU] | INTRAVENOUS | Status: DC | PRN

## 2022-08-30 MED ORDER — EVEROLIMUS 10 MG PO TABS: 10.0000 mg | ORAL_TABLET | Freq: Every day | ORAL | 3 refills | Status: DC

## 2022-08-30 MED ORDER — FUROSEMIDE 10 MG/ML IJ SOLN: 20.0000 mg | Freq: Once | INTRAMUSCULAR | Status: DC

## 2022-08-30 MED ORDER — INSULIN DETEMIR 100 UNIT/ML ~~LOC~~ SOLN: 20.0000 [IU] | Freq: Two times a day (BID) | SUBCUTANEOUS | 4 refills | Status: DC

## 2022-08-30 MED ORDER — CARVEDILOL 12.5 MG PO TABS: 12.5000 mg | ORAL_TABLET | Freq: Two times a day (BID) | ORAL | 5 refills | Status: DC

## 2022-08-30 MED ORDER — GABAPENTIN 100 MG PO CAPS: 100.0000 mg | ORAL_CAPSULE | Freq: Two times a day (BID) | ORAL | 3 refills | Status: DC

## 2022-08-30 MED ORDER — SODIUM CHLORIDE 0.9% FLUSH: 10.0000 mL | INTRAVENOUS | Status: DC | PRN

## 2022-08-30 NOTE — Patient Instructions (Signed)

## 2022-08-30 NOTE — Progress Notes (Signed)
Hematology and Oncology Follow Up Visit  Jaime Gomez [id number] [birth year] 56 y.o. 08/30/2022   Principle Diagnosis:  Metastatic adenocarcinoma of the endometrium -- No Actionable mutations Chronic renal failure Recurrent thromboembolic disease  Current Therapy:   Afinitor/letrozole Blood transfusion as needed Eliquis 2.5 mg p.o. twice daily --increase to 5 mg p.o. twice daily on 03/03/2022 -- d/c on 03/13/2022 Arixtra 7.5 mg sq q day --  start on 03/13/2022     Interim History:  Jaime Gomez is back for follow-up.  She has not been here in 4 months.  She apparently has some insurance issues.  She does feel a bit tired.  She says she sleeps little bit more.  I can understand why since her hemoglobin is only 5.8.  We will have to transfuse her.  She does have renal insufficiency.  This has been chronic.  We really cannot give her ESA because of her history of thromboembolic disease.  Her last CA-125, which was done back in October was 289.  She continues on the Afinitor and letrozole.    She did have a PET scan.  This was done on 08/21/2022.  This showed that she did have an increase in some lung nodules.  The right upper lobe nodule now measures 3.3 cm.  The right lower lobe nodule now measures 3.7 cm.  In the left upper lobe nodule now measures 3.7 x 2.9 cm.  She really does not wish to have chemotherapy.  I totally understand this.  We will see if Radiation Oncology can do radiosurgery on her.  They have done past radiation on her I think pelvis.  She does have diabetes.  She is trying to manage this with insulin.  Her appetite has been okay.  She has had no nausea or vomiting.  She has had no cough or shortness of breath.  She has had no fever.  Thankfully, has been no problems with COVID.  When I saw her, she had fallen and broken her left humerus.  This did show positive on a PET scan but I think this was more inflammation.  She still has the nephrostomy tube in.  I think she sees  Urology and hopefully will have this taken out at some point.  Currently, I would have to say that her performance status is probably ECOG 1.    Medications:  Current Outpatient Medications:    acetaminophen (TYLENOL) 500 MG tablet, Take 500 mg by mouth every 6 (six) hours as needed for mild pain., Disp: , Rfl:    carvedilol (COREG) 25 MG tablet, Take 25 mg by mouth daily at 6 (six) AM., Disp: , Rfl:    everolimus (AFINITOR) 10 MG tablet, Take 10 mg by mouth daily at 6 (six) AM., Disp: , Rfl:    folic acid (FOLVITE) 1 MG tablet, Take 1 mg by mouth daily at 6 (six) AM., Disp: , Rfl:    fondaparinux (ARIXTRA) 7.5 MG/0.6ML SOLN injection, Inject 0.6 mLs (7.5 mg total) into the skin daily., Disp: 18 mL, Rfl: 6   gabapentin (NEURONTIN) 100 MG capsule, Take 100 mg by mouth 2 (two) times daily., Disp: , Rfl:    insulin aspart protamine - aspart (NOVOLOG MIX 70/30 FLEXPEN) (70-30) 100 UNIT/ML FlexPen, Inject 20 Units into the skin as directed. Per meal, Disp: , Rfl:    insulin detemir (LEVEMIR) 100 UNIT/ML injection, Inject 20 Units into the skin 2 (two) times daily., Disp: , Rfl:    letrozole (FEMARA) 2.5 MG tablet, Take  2.5 mg by mouth daily at 6 (six) AM., Disp: , Rfl:    nitrofurantoin, macrocrystal-monohydrate, (MACROBID) 100 MG capsule, Take 1 capsule (100 mg total) by mouth 2 (two) times daily., Disp: 180 capsule, Rfl: 6   ondansetron (ZOFRAN) 8 MG tablet, Take 8 mg by mouth every 8 (eight) hours as needed., Disp: , Rfl:    oxybutynin (DITROPAN-XL) 10 MG 24 hr tablet, Take 10 mg by mouth daily., Disp: , Rfl:    oxycodone (OXY-IR) 5 MG capsule, Take 1 capsule (5 mg total) by mouth every 4 (four) hours as needed for pain., Disp: 120 capsule, Rfl: 0   pantoprazole (PROTONIX) 40 MG tablet, Take 40 mg by mouth daily., Disp: , Rfl:    polyethylene glycol (MIRALAX / GLYCOLAX) 17 g packet, Take 17 g by mouth daily as needed for mild constipation., Disp: 14 each, Rfl: 0   pravastatin (PRAVACHOL) 80 MG  tablet, Take 80 mg by mouth daily at 6 (six) AM., Disp: , Rfl:    prochlorperazine (COMPAZINE) 10 MG tablet, Take 10 mg by mouth every 6 (six) hours as needed for nausea or vomiting., Disp: , Rfl:    mirabegron ER (MYRBETRIQ) 25 MG TB24 tablet, Take 1 tablet (25 mg total) by mouth daily for 360 doses., Disp: 30 tablet, Rfl: 11  Allergies:  Allergies  Allergen Reactions   Taxotere [Docetaxel]     Throat swoll    Past Medical History, Surgical history, Social history, and Family History were reviewed and updated.  Review of Systems: Review of Systems  Constitutional:  Positive for fatigue.  HENT:  Negative.    Eyes: Negative.   Respiratory: Negative.    Cardiovascular:  Positive for leg swelling.  Gastrointestinal:  Positive for abdominal pain.  Genitourinary:  Positive for pelvic pain.   Musculoskeletal: Negative.   Skin: Negative.   Neurological: Negative.   Hematological: Negative.   Psychiatric/Behavioral: Negative.      Physical Exam:  height is '5\' 5"'$  (1.651 m) and weight is 149 lb (67.6 kg). Her oral temperature is 98.2 F (36.8 C). Her blood pressure is 138/60 and her pulse is 71. Her respiration is 18 and oxygen saturation is 100%.   Wt Readings from Last 3 Encounters:  08/30/22 149 lb (67.6 kg)  05/19/22 151 lb 14.4 oz (68.9 kg)  05/05/22 152 lb (68.9 kg)    Physical Exam Vitals reviewed.  HENT:     Head: Normocephalic and atraumatic.  Eyes:     Pupils: Pupils are equal, round, and reactive to light.  Cardiovascular:     Rate and Rhythm: Normal rate and regular rhythm.     Heart sounds: Normal heart sounds.  Pulmonary:     Effort: Pulmonary effort is normal.     Breath sounds: Normal breath sounds.  Abdominal:     General: Bowel sounds are normal.     Palpations: Abdomen is soft.  Musculoskeletal:        General: No tenderness or deformity. Normal range of motion.     Cervical back: Normal range of motion.  Lymphadenopathy:     Cervical: No cervical  adenopathy.  Skin:    General: Skin is warm and dry.     Findings: No erythema or rash.  Neurological:     Mental Status: She is alert and oriented to person, place, and time.  Psychiatric:        Behavior: Behavior normal.        Thought Content: Thought content normal.  Judgment: Judgment normal.     Lab Results  Component Value Date   WBC 3.4 (L) 08/30/2022   HGB 5.8 (LL) 08/30/2022   HCT 19.0 (L) 08/30/2022   MCV 87.2 08/30/2022   PLT 168 08/30/2022     Chemistry      Component Value Date/Time   NA 136 05/05/2022 0936   K 4.4 05/05/2022 0936   CL 107 05/05/2022 0936   CO2 20 (L) 05/05/2022 0936   BUN 57 (H) 05/05/2022 0936   CREATININE 2.93 (H) 05/05/2022 0936   CREATININE 3.04 (HH) 04/27/2022 1530      Component Value Date/Time   CALCIUM 9.8 05/05/2022 0936   ALKPHOS 101 04/27/2022 1530   AST 6 (L) 04/27/2022 1530   ALT <5 04/27/2022 1530   BILITOT 0.4 04/27/2022 1530      Impression and Plan: Jaime Gomez is a very charming 57 year old white female.  She has metastatic endometrial cancer.  She is on Afinitor/letrozole.  The PET scan that she had done back in May shows that she was progressing. Unfortunately, we do not have any actionable mutations that we could try to target.  It is certainly conceivable that the Afinitor/letrozole is starting to lose its effectiveness.  It would be very interesting to see what the CA-125 looks like.  Hopefully, Radiation Oncology can do radiosurgery on these lung lesions.  We are going to have to give her blood.  We do not transfuse her for several months.  Everything that we do really is quality of life.  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  She really hates this nephrostomy tube.  I am not sure if Urology will be able to get this taken out.  Again, our options for systemic therapy or not all that great.  I know she would like to avoid chemotherapy.  I totally understand this.  I would like to get  her back in about 3 to 4 weeks.  If Radiation Oncology cannot do radiosurgery for the lung lesions, we just may have to "bite the bullet" and try to treat her with another systemic therapy.  I must say that given her issues, she really does look quite good.  She also has great support at home.  She is really motivated.      [name], MD 2/7/20241:17 PM

## 2022-08-30 NOTE — Telephone Encounter (Signed)
Critical Hemoglobin 5.8 reported by [facility] in lab.  Dr Marin Olp notified.  Will see patient in exam room

## 2022-08-31 ENCOUNTER — Telehealth: Payer: Self-pay | Admitting: Radiation Oncology

## 2022-08-31 LAB — IRON AND IRON BINDING CAPACITY (CC-WL,HP ONLY)
Iron: 26 ug/dL — ABNORMAL LOW (ref 28–170)
Saturation Ratios: 13 % (ref 10.4–31.8)
TIBC: 203 ug/dL — ABNORMAL LOW (ref 250–450)
UIBC: 177 ug/dL (ref 148–442)

## 2022-08-31 NOTE — Telephone Encounter (Signed)
2/8 @ 9:32 am Left voicemail for patient to call our office to be schedule for f/u consult with Dr. Sondra Come.

## 2022-09-01 ENCOUNTER — Telehealth: Payer: Self-pay | Admitting: Radiation Oncology

## 2022-09-01 ENCOUNTER — Inpatient Hospital Stay: Payer: 59

## 2022-09-01 DIAGNOSIS — D649 Anemia, unspecified: Secondary | ICD-10-CM | POA: Diagnosis not present

## 2022-09-01 DIAGNOSIS — C541 Malignant neoplasm of endometrium: Secondary | ICD-10-CM

## 2022-09-01 DIAGNOSIS — D631 Anemia in chronic kidney disease: Secondary | ICD-10-CM

## 2022-09-01 LAB — CA 125: Cancer Antigen (CA) 125: 377 U/mL — ABNORMAL HIGH (ref 0.0–38.1)

## 2022-09-01 MED ORDER — SODIUM CHLORIDE 0.9% FLUSH
10.0000 mL | Freq: Once | INTRAVENOUS | Status: AC
  Administered 2022-09-01: 10 mL via INTRAVENOUS

## 2022-09-01 MED ORDER — HEPARIN SOD (PORK) LOCK FLUSH 100 UNIT/ML IV SOLN
500.0000 [IU] | Freq: Once | INTRAVENOUS | Status: AC
  Administered 2022-09-01: 500 [IU] via INTRAVENOUS

## 2022-09-01 NOTE — Telephone Encounter (Signed)
2/9 @ 9:00 am Left voicemail for patient to call our office to be schedule for consult with Dr. Sondra Come

## 2022-09-01 NOTE — Patient Instructions (Signed)
Blood Transfusion, Adult A blood transfusion is a procedure in which you receive blood or a type of blood cell (blood component) through an IV. You may need a blood transfusion when you have a low blood count, which is a low number of any blood cell. This may result from a bleeding disorder, illness, injury, or surgery. The blood may come from a donor, or you may be able to have your own blood collected and stored (autologous blood donation) before a planned surgery. The blood given in a transfusion may be made up of different blood components. You may receive: Red blood cells. These carry oxygen to the cells in the body. Platelets. These help your blood to clot. Plasma. This is the liquid part of your blood. It carries proteins and other substances throughout the body. White blood cells. These help you fight infections. If you have hemophilia or another clotting disorder, you may also receive other types of blood products. Depending on the type of blood product, this procedure may take 1-4 hours to complete. Tell a health care provider about: Any bleeding problems you have. Any previous reactions you have had during a blood transfusion. Any allergies you have. All medicines you are taking, including vitamins, herbs, eye drops, creams, and over-the-counter medicines. Any surgeries you have had. Any medical conditions you have. Whether you are pregnant or may be pregnant. What are the risks? Talk with your health care provider about risks. The most common problems include: A mild allergic reaction, such as red, swollen areas of skin (hives) and itching. Fever or chills. This may be the body's response to new blood cells received. This may occur during or up to 4 hours after the transfusion. More serious problems may include: A serious allergic reaction that causes difficulty breathing or swelling around the face and lips. Transfusion-associated circulatory overload (TACO), or too much fluid in  the lungs. This may cause breathing problems. Transfusion-related acute lung injury (TRALI), which causes breathing difficulty and low oxygen in the blood. This can occur within hours of the transfusion or several days later. Iron overload. This can happen after receiving many blood transfusions over a period of time. Infection or virus being transmitted. This is rare because donated blood is carefully tested before it is given. Hemolytic transfusion reaction. This is rare. It happens when the body's defense system (immune system)tries to attack the new blood cells. Symptoms may include fever, chills, nausea, low blood pressure, and low back or chest pain. Transfusion-associated graft-versus-host disease (TAGVHD). This is rare. It happens when donated cells attack the body's healthy tissues. What happens before the procedure? You will have a blood test to check your blood type. This test is done to know what kind of blood your body will accept and to match it to the donor blood. If you are going to have a planned surgery, you may be able to do an autologous blood donation. This may be done in case you need to have a transfusion. You will have your temperature, blood pressure, and pulse checked before the transfusion. If you have had an allergic reaction to a transfusion in the past, you may be given medicine to help prevent a reaction. This medicine may be given to you by mouth (orally) or through an IV. What happens during the procedure?  An IV will be inserted into one of your veins. The bag of blood will be attached to your IV. The blood will then enter through your vein. Your temperature, blood pressure,   and pulse will be monitored during the transfusion. This monitoring is done to detect early signs of a transfusion reaction. Tell your nurse right away if you have any of these symptoms during the transfusion: Shortness of breath or trouble breathing. Chest or back pain. Fever or  chills. Itching or hives. If you have any signs or symptoms of a reaction, your transfusion will be stopped and you may be given medicine. When the transfusion is complete, your IV will be removed. Pressure may be applied to the IV site for a few minutes. A bandage (dressing)will be applied. The procedure may vary among health care providers and hospitals. What happens after the procedure? Your temperature, blood pressure, pulse, breathing rate, and blood oxygen level will be monitored until you leave the hospital or clinic. Your blood may be tested to see how you have responded to the transfusion. You may be warmed with fluids or blankets to maintain a normal body temperature. If you receive your blood transfusion in an outpatient setting, you will be told whom to contact to report any reactions. Where to find more information Visit the [employer]: redcross.org Summary A blood transfusion is a procedure in which you receive blood or a type of blood cell (blood component) through an IV. The blood given in a transfusion may be made up of different blood components. You may receive red blood cells, platelets, plasma, or white blood cells depending on the condition treated. Your temperature, blood pressure, and pulse will be monitored before, during, and after the transfusion. After the transfusion, your blood may be tested to see how your body has responded. This information is not intended to replace advice given to you by your health care provider. Make sure you discuss any questions you have with your health care provider. Document Revised: 10/07/2021 Document Reviewed: 10/07/2021 Elsevier Patient Education  2023 [employer]vier Inc.  

## 2022-09-02 LAB — TYPE AND SCREEN
ABO/RH(D): A POS
Antibody Screen: POSITIVE
Donor AG Type: NEGATIVE
Donor AG Type: NEGATIVE
Unit division: 0
Unit division: 0

## 2022-09-02 LAB — BPAM RBC
Blood Product Expiration Date: 202403032359
Blood Product Expiration Date: 202403032359
ISSUE DATE / TIME: 202402090721
ISSUE DATE / TIME: 202402090721
Unit Type and Rh: 6200
Unit Type and Rh: 6200

## 2022-09-04 ENCOUNTER — Inpatient Hospital Stay: Payer: 59

## 2022-09-04 VITALS — BP 155/75 | HR 63 | Temp 99.1°F | Resp 18

## 2022-09-04 DIAGNOSIS — D649 Anemia, unspecified: Secondary | ICD-10-CM | POA: Diagnosis not present

## 2022-09-04 DIAGNOSIS — R21 Rash and other nonspecific skin eruption: Secondary | ICD-10-CM

## 2022-09-04 DIAGNOSIS — Z95828 Presence of other vascular implants and grafts: Secondary | ICD-10-CM

## 2022-09-04 MED ORDER — METHYLPREDNISOLONE SODIUM SUCC 40 MG IJ SOLR
80.0000 mg | Freq: Once | INTRAMUSCULAR | Status: AC
  Administered 2022-09-04: 80 mg via INTRAVENOUS
  Filled 2022-09-04: qty 2

## 2022-09-04 MED ORDER — HEPARIN SOD (PORK) LOCK FLUSH 100 UNIT/ML IV SOLN
500.0000 [IU] | Freq: Once | INTRAVENOUS | Status: AC
  Administered 2022-09-04: 500 [IU] via INTRAVENOUS

## 2022-09-04 MED ORDER — SODIUM CHLORIDE 0.9 % IV SOLN
40.0000 mg | Freq: Once | INTRAVENOUS | Status: AC
  Administered 2022-09-04: 40 mg via INTRAVENOUS
  Filled 2022-09-04: qty 4

## 2022-09-04 MED ORDER — SODIUM CHLORIDE 0.9 % IV SOLN: INTRAVENOUS | Status: DC

## 2022-09-04 MED ORDER — SODIUM CHLORIDE 0.9% FLUSH
10.0000 mL | Freq: Once | INTRAVENOUS | Status: AC
  Administered 2022-09-04: 10 mL via INTRAVENOUS

## 2022-09-04 NOTE — Patient Instructions (Signed)
Famotidine Solution for Injection What is this medication? FAMOTIDINE (fa MOE ti deen) treats stomach ulcers, reflux disease, or other conditions that cause too much stomach acid. It works by reducing the amount of acid in the stomach. This medicine may be used for other purposes; ask your health care provider or pharmacist if you have questions. COMMON BRAND NAME(S): Pepcid What should I tell my care team before I take this medication? They need to know if you have any of these conditions: Kidney or liver disease An unusual or allergic reaction to famotidine, other medications, foods, dyes, or preservatives Pregnant or trying to get pregnant Breast-feeding How should I use this medication? This medication is for infusion into a vein. It is given in a hospital or clinic setting. Talk to your care team regarding the use of this medication in children. Special care may be needed. Overdosage: If you think you have taken too much of this medicine contact a poison control center or emergency room at once. NOTE: This medicine is only for you. Do not share this medicine with others. What if I miss a dose? This does not apply. What may interact with this medication? Delavirdine Itraconazole Ketoconazole This list may not describe all possible interactions. Give your health care provider a list of all the medicines, herbs, non-prescription drugs, or dietary supplements you use. Also tell them if you smoke, drink alcohol, or use illegal drugs. Some items may interact with your medicine. What should I watch for while using this medication? Tell your doctor or health care professional if your condition does not start to get better or gets worse. Do not take with aspirin, ibuprofen, or other antiinflammatory medications. These can aggravate your condition. Do not smoke cigarettes or drink alcohol. These increase irritation in your stomach and can increase the time it will take for ulcers to heal.  Cigarettes and alcohol can also worsen acid reflux or heartburn. If you get black, tarry stools or vomit up what looks like coffee grounds, call your doctor or health care professional at once. You may have a bleeding ulcer. This medication may cause a decrease in vitamin B12. Make sure that you get enough vitamin B12 while you are taking this medication. Discuss the foods you eat and the vitamins you take with your care team. What side effects may I notice from receiving this medication? Side effects that you should report to your care team as soon as possible: Allergic reactions--skin rash, itching, hives, swelling of the face, lips, tongue, or throat Confusion Hallucinations Side effects that usually do not require medical attention (report to your care team if they continue or are bothersome): Constipation Diarrhea Dizziness Headache This list may not describe all possible side effects. Call your doctor for medical advice about side effects. You may report side effects to FDA at 1-800-FDA-1088. Where should I keep my medication? This medication is given in a hospital or clinic. You will not be given this medication to store at home. NOTE: This sheet is a summary. It may not cover all possible information. If you have questions about this medicine, talk to your doctor, pharmacist, or health care provider.  2023 Elsevier/Gold Standard (2020-07-13 00:00:00) Methylprednisolone Solution Injection What is this medication? METHYLPREDNISOLONE (meth ill pred NISS oh lone) treats many conditions such as asthma, allergic reactions, arthritis, inflammatory bowel diseases, adrenal, and blood or bone marrow disorders. It works by decreasing inflammation, slowing down an overactive immune system, or replacing cortisol normally made in the body. Cortisol is  a hormone that plays an important role in how the body responds to stress, illness, and injury. It belongs to a group of medications called  steroids. This medicine may be used for other purposes; ask your health care provider or pharmacist if you have questions. COMMON BRAND NAME(S): A-Methapred, Solu-Medrol What should I tell my care team before I take this medication? They need to know if you have any of these conditions: Cushing's syndrome Eye disease, vision problems Diabetes Glaucoma Heart disease High blood pressure Infection especially a viral infection, such as chickenpox, cold sores, or herpes Liver disease Mental health conditions Myasthenia gravis Osteoporosis Recent or upcoming vaccine Seizures Stomach or intestine problems Thyroid disease An unusual or allergic reaction to lactose, methylprednisolone, other medications, foods, dyes, or preservatives Pregnant or trying to get pregnant Breastfeeding How should I use this medication? This medication is for injection or infusion into a vein. It is also for injection into a muscle. It is given by your care team in a hospital or clinic setting. Talk to your care team about the use of this medication in children. While this medication may be prescribed for selected conditions, precautions do apply. Overdosage: If you think you have taken too much of this medicine contact a poison control center or emergency room at once. NOTE: This medicine is only for you. Do not share this medicine with others. What if I miss a dose? This does not apply. What may interact with this medication? Do not take this medication with any of the following: Alefacept Echinacea Iopamidol Live virus vaccines Metyrapone Mifepristone This medication may also interact with the following: Amphotericin B Aspirin and aspirin-like medications Certain antibiotics, such as erythromycin, clarithromycin, troleandomycin Certain medications for diabetes Certain medications for fungal infections, such as ketoconazole Certain medications for seizures, such as carbamazepine, phenobarbital,  phenytoin Certain medications that treat or prevent blood clots, such as warfarin Cyclosporine Digoxin Diuretics Estrogen or progestin hormones Isoniazid NSAIDS, medications for pain and inflammation, such as ibuprofen or naproxen Other medications for myasthenia gravis Rifampin Vaccines This list may not describe all possible interactions. Give your health care provider a list of all the medicines, herbs, non-prescription drugs, or dietary supplements you use. Also tell them if you smoke, drink alcohol, or use illegal drugs. Some items may interact with your medicine. What should I watch for while using this medication? Tell your care team if your symptoms do not start to get better or if they get worse. Do not stop taking except on your care team's advice. You may develop a severe reaction. Your care team will tell you how much medication to take. Your condition will be monitored carefully while you are receiving this medication. This medication may increase your risk of getting an infection. Tell your care team if you are around anyone with measles or chickenpox, or if you develop sores or blisters that do not heal properly. This medication may increase blood sugar. Ask your care team if changes in diet or medications are needed if you have diabetes. Tell your care team right away if you have any change in your eyesight. Using this medication for a long time may increase your risk of low bone mass. Talk to your care team about bone health. What side effects may I notice from receiving this medication? Side effects that you should report to your care team as soon as possible: Allergic reactions--skin rash, itching, hives, swelling of the face, lips, tongue, or throat Cushing syndrome--increased fat around the midsection,  upper back, neck, or face, pink or purple stretch marks on the skin, thinning, fragile skin that easily bruises, unexpected hair growth High blood sugar  (hyperglycemia)--increased thirst or amount of urine, unusual weakness or fatigue, blurry vision Increase in blood pressure Infection--fever, chills, cough, sore throat, wounds that don't heal, pain or trouble when passing urine, general feeling of discomfort or being unwell Low adrenal gland function--nausea, vomiting, loss of appetite, unusual weakness or fatigue, dizziness Mood and behavior changes--anxiety, nervousness, confusion, hallucinations, irritability, hostility, thoughts of suicide or self-harm, worsening mood, feelings of depression Stomach bleeding--bloody or black, tar-like stools, vomiting blood or brown material that looks like coffee grounds Swelling of the ankles, hands, or feet Side effects that usually do not require medical attention (report to your care team if they continue or are bothersome): Acne General discomfort and fatigue Headache Increase in appetite Nausea Trouble sleeping Weight gain This list may not describe all possible side effects. Call your doctor for medical advice about side effects. You may report side effects to FDA at 1-800-FDA-1088. Where should I keep my medication? This medication is given in a hospital or clinic and will not be stored at home. NOTE: This sheet is a summary. It may not cover all possible information. If you have questions about this medicine, talk to your doctor, pharmacist, or health care provider.  2023 Elsevier/Gold Standard (2020-09-13 00:00:00) Dehydration, Adult Dehydration is a condition in which there is not enough water or other fluids in the body. This happens when a person loses more fluids than he or she takes in. Important organs, such as the kidneys, brain, and heart, cannot function without a proper amount of fluids. Any loss of fluids from the body can lead to dehydration. Dehydration can be mild, moderate, or severe. It should be treated right away to prevent it from becoming severe. What are the  causes? Dehydration may be caused by: Conditions that cause loss of water or other fluids, such as diarrhea, vomiting, or sweating or urinating a lot. Not drinking enough fluids, especially when you are ill or doing activities that require a lot of energy. Other illnesses and conditions, such as fever or infection. Certain medicines, such as medicines that remove excess fluid from the body (diuretics). Lack of safe drinking water. Not being able to get enough water and food. What increases the risk? The following factors may make you more likely to develop this condition: Having a long-term (chronic) illness that has not been treated properly, such as diabetes, heart disease, or kidney disease. Being 17 years of age or older. Having a disability. Living in a place that is high in altitude, where thinner, drier air causes more fluid loss. Doing exercises that put stress on your body for a long time (endurance sports). What are the signs or symptoms? Symptoms of dehydration depend on how severe it is. Mild or moderate dehydration Thirst. Dry lips or dry mouth. Dizziness or light-headedness, especially when standing up from a seated position. Muscle cramps. Dark urine. Urine may be the color of tea. Less urine or tears produced than usual. Headache. Severe dehydration Changes in skin. Your skin may be cold and clammy, blotchy, or pale. Your skin also may not return to normal after being lightly pinched and released. Little or no tears, urine, or sweat. Changes in vital signs, such as rapid breathing and low blood pressure. Your pulse may be weak or may be faster than 100 beats a minute when you are sitting still. Other changes,  such as: Feeling very thirsty. Sunken eyes. Cold hands and feet. Confusion. Being very tired (lethargic) or having trouble waking from sleep. Short-term weight loss. Loss of consciousness. How is this diagnosed? This condition is diagnosed based on your  symptoms and a physical exam. You may have blood and urine tests to help confirm the diagnosis. How is this treated? Treatment for this condition depends on how severe it is. Treatment should be started right away. Do not wait until dehydration becomes severe. Severe dehydration is an emergency and needs to be treated in a hospital. Mild or moderate dehydration can be treated at home. You may be asked to: Drink more fluids. Drink an oral rehydration solution (ORS). This drink helps restore proper amounts of fluids and salts and minerals in the blood (electrolytes). Severe dehydration can be treated: With IV fluids. By correcting abnormal levels of electrolytes. This is often done by giving electrolytes through a tube that is passed through your nose and into your stomach (nasogastric tube, or NG tube). By treating the underlying cause of dehydration. Follow these instructions at home: Oral rehydration solution If told by your health care provider, drink an ORS: Make an ORS by following instructions on the package. Start by drinking small amounts, about  cup (120 mL) every 5-10 minutes. Slowly increase how much you drink until you have taken the amount recommended by your health care provider. Eating and drinking        Drink enough clear fluid to keep your urine pale yellow. If you were told to drink an ORS, finish the ORS first and then start slowly drinking other clear fluids. Drink fluids such as: Water. Do not drink only water. Doing that can lead to hyponatremia, which is having too little salt (sodium) in the body. Water from ice chips you suck on. Fruit juice that you have added water to (diluted fruit juice). Low-calorie sports drinks. Eat foods that contain a healthy balance of electrolytes, such as bananas, oranges, potatoes, tomatoes, and spinach. Do not drink alcohol. Avoid the following: Drinks that contain a lot of sugar. These include high-calorie sports drinks, fruit  juice that is not diluted, and soda. Caffeine. Foods that are greasy or contain a lot of fat or sugar. General instructions Take over-the-counter and prescription medicines only as told by your health care provider. Do not take sodium tablets. Doing that can lead to having too much sodium in the body (hypernatremia). Return to your normal activities as told by your health care provider. Ask your health care provider what activities are safe for you. Keep all follow-up visits as told by your health care provider. This is important. Contact a health care provider if: You have muscle cramps, pain, or discomfort, such as: Pain in your abdomen and the pain gets worse or stays in one area (localizes). Stiff neck. You have a rash. You are more irritable than usual. You are sleepier or have a harder time waking than usual. You feel weak or dizzy. You feel very thirsty. Get help right away if you have: Any symptoms of severe dehydration. Symptoms of vomiting, such as: You cannot eat or drink without vomiting. Vomiting gets worse or does not go away. Vomit includes blood or green matter (bile). Symptoms that get worse with treatment. A fever. A severe headache. Problems with urination or bowel movements, such as: Diarrhea that gets worse or does not go away. Blood in your stool (feces). This may cause stool to look black and tarry. Not urinating,  or urinating only a small amount of very dark urine, within 6-8 hours. Trouble breathing. These symptoms may represent a serious problem that is an emergency. Do not wait to see if the symptoms will go away. Get medical help right away. Call your local emergency services (911 in the [place] Do not drive yourself to the hospital. Summary Dehydration is a condition in which there is not enough water or other fluids in the body. This happens when a person loses more fluids than he or she takes in. Treatment for this condition depends on how severe it is.  Treatment should be started right away. Do not wait until dehydration becomes severe. Drink enough clear fluid to keep your urine pale yellow. If you were told to drink an oral rehydration solution (ORS), finish the ORS first and then start slowly drinking other clear fluids. Take over-the-counter and prescription medicines only as told by your health care provider. Get help right away if you have any symptoms of severe dehydration. This information is not intended to replace advice given to you by your health care provider. Make sure you discuss any questions you have with your health care provider. Document Revised: 11/16/2021 Document Reviewed: 02/20/2019 Elsevier Patient Education  [place].

## 2022-09-04 NOTE — Progress Notes (Signed)
Pt here c/o rash to cheeks, arms neck. Blood transfusion Friday. S/s began Friday night. Pt applied benadryl and calamine lotion over weekend. Reviewed with MD, VO to give Solumedrol 28m, pepcid 445mand Ativan 0.2521mNo iron today per MD.

## 2022-09-05 ENCOUNTER — Other Ambulatory Visit: Payer: Self-pay

## 2022-09-05 DIAGNOSIS — C541 Malignant neoplasm of endometrium: Secondary | ICD-10-CM

## 2022-09-05 MED ORDER — OXYCODONE HCL 5 MG PO CAPS: 5.0000 mg | ORAL_CAPSULE | ORAL | 0 refills | Status: DC | PRN

## 2022-09-06 ENCOUNTER — Telehealth: Payer: Self-pay | Admitting: Radiation Oncology

## 2022-09-06 NOTE — Telephone Encounter (Signed)
2/14 @ 9:07 am Left voicemail on patient's and patient sister contact # [phone] for patient to call our office to be schedule for f/u consult with Dr. Sondra Come.

## 2022-09-07 ENCOUNTER — Telehealth: Payer: Self-pay | Admitting: Radiation Oncology

## 2022-09-07 NOTE — Telephone Encounter (Signed)
2/15 @ 8:30 am Left voicemail for patient to call our office to be schedule for an f/u consult.  F/U call to Dr. Antonieta Pert office so they are aware made several attempts to reach out to patient and patient's sister to get schedule with Dr. Sondra Come, no response.  Sending out unable to contact letter to patient.

## 2022-09-07 NOTE — Progress Notes (Incomplete)
Histology and Location of Primary Cancer:  Metastatic endometrial cancer   Location(s) of Symptomatic tumor(s):  PET Scan  08/21/2022 --IMPRESSION: Interval enlargement of bilateral hypermetabolic pulmonary masses most consistent progression of metastatic disease. New hypermetabolic soft tissue in the LEFT shoulder/glenohumeral joint. Recommend correlation with trauma or inflammation. Interval improvement in metabolic activity associated with the sacrum.  Past/Anticipated chemotherapy by medical oncology, if any:  Under care of Dr. Burney Gauze 08/30/2022 She is on Afinitor/letrozole.  The PET scan that she had done back in May shows that she was progressing. Unfortunately, we do not have any actionable mutations that we could try to target. It is certainly conceivable that the Afinitor/letrozole is starting to lose its effectiveness.  It would be very interesting to see what the CA-125 looks like. Hopefully, Radiation Oncology can do radiosurgery on these lung lesions. We are going to have to give her blood.  We do not transfuse her for several months. Everything that we do really is quality of life. 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 She really hates this nephrostomy tube.  I am not sure if Urology will be able to get this taken out. Again, our options for systemic therapy or not all that great.  I know she would like to avoid chemotherapy.  I totally understand this. I would like to get her back in about 3 to 4 weeks. If Radiation Oncology cannot do radiosurgery for the lung lesions, we just may have to "bite the bullet" and try to treat her with another systemic therapy. I must say that given her issues, she really does look quite good.  She also has great support at home.  She is really motivated.   Patient's main complaints related to symptomatic tumor(s) are: ***  Signs/Symptoms Weight changes, if any: *** Respiratory complaints, if any: *** Hemoptysis, if  any: *** Pain issues, if any:  ***  SAFETY ISSUES: Prior radiation? Yes 01/26/2022 through 02/09/2022 Site Technique Total Dose (Gy) Dose per Fx (Gy) Completed Fx Beam Energies  Lumbar Spine: Spine_Sacral IMRT 30/30 3 10/10 6X  4500 cGy in 25 fractions in 2012, vaginal brachytherapy in [place] completed 06/21/2011   Pacemaker/ICD? No Possible current pregnancy? No--hysterectomy Is the patient on methotrexate? No  Additional Complaints / other details:  ***

## 2022-09-09 NOTE — Progress Notes (Incomplete)
Radiation Oncology         [phone] ________________________________  Outpatient Re-Consultation  Name: Jaime Gomez MRN: [id number]  Date: 09/11/2022  DOB: [birth year]  [id number], Jaime Carina, FNP  [name], MD   REFERRING PHYSICIAN: [name], MD  DIAGNOSIS: There were no encounter diagnoses.  The primary encounter diagnosis was Endometrial cancer, FIGO stage IVB ([place]). A diagnosis of Cancer, metastatic to bone [facility]) was also pertinent to this visit.  Metastatic endometrial cancer: initially diagnosed in 2012 with 2 synchronous primaries of stage IC adenocarcinoma of the ovary and stage IIIC2 moderately differentiated endometrioid adenocarcinoma of the uterus              - PET showed pulmonary metastases and a new tumor involving the lower sacrum   - Recent PET performed in January 2024 shows progression of bilateral pulmonary metastases/masses in the RUL, RLL, and LUL   Cancer Staging  Endometrial cancer, FIGO stage IVB ([place]) Staging form: Corpus Uteri - Carcinoma and Carcinosarcoma, AJCC 8th Edition - Clinical stage from 08/18/2021: FIGO Stage IVB (rcT3a, cNX, pM1) - Signed by [name], MD on 08/18/2021  Interval Since Last Radiation: almost 7 months   Intent: Palliative  Radiation Treatment Dates: 01/26/2022 through 02/09/2022  Radiation Treatment Dates: 01/26/2022 through 02/09/2022 Site Technique Total Dose (Gy) Dose per Fx (Gy) Completed Fx Beam Energies  Lumbar Spine: Spine_Sacral IMRT 30/30 3 10/10 6X    HISTORY OF PRESENT ILLNESS::Jaime Gomez is a 57 y.o. female who is accompanied by ***. she is seen as a courtesy of Dr. Marin Olp for re-evaluation and an opinion concerning further radiation therapy as part of management for her recent progression of metastatic endometrial cancer. I last met with the patient on 03/13/22 for follow-up of radiation to the site of osseous metastasis in the lumbar spine/sacrum. She had significant improvement in her pain  related to the lumbar spine lesion.   Since that time, the patient continued on combination oral chemotherapy therapy consisting of letrozole/Afinitor under Dr. Marin Olp. She required blood transfusions while undergoing treatment. Her disease remained relatively stable in the interval until evidence of disease progression indicated on her recent PET scan, detailed below.   PET scan on 08/21/22 demonstrated: an interval increase in size and hypermetabolism of the bilateral hypermetabolic pulmonary masses located in the RUL, RLL, and LUL, most consistent progression of metastatic disease. PET also showed new hypermetabolic soft tissue in the left shoulder / glenohumeral joint (likely related to her recent injury detailed below), and interval improvement in metabolic activity associated with the sacrum.   During her most recent follow-up visit with Dr. Marin Olp on 08/30/22, the patient was noted to be feeling more tired than usual. Labs reviewed showed hemoglobin at 5.8 and she was accordingly transfused. For the increasing pulmonary masses, Dr. Marin Olp recommends radiosurgery to the lesions which we will discuss in detail today. In regards to chemotherapy, the patient would understandably like to avoid systemic treatment if at all possible to preserve her quality of life.  Of note: the patient presented to the ED on 04/21/22 with left shoulder pain resulting from a fall. X-ray of the left shoulder performed showed an anterior glenohumeral dislocation with a mildly displaced proximal right humeral fracture. CT of the left shoulder also performed following left shoulder reduction showed proper placement of the left humeral head with a persistent fracture of the proximal left humerus. CT also showed a nodular consolidation of the right hilum architectural distortion bronchiolectasis, possibly related to  radiation fibrosis however underlying malignancy could not be excluded. She was discharged with a sling in stable  condition, and instructed to follow-up with OP ortho.   The patient also underwent a cytoscopy with right ureteral stent exchange on 05/19/22 related to her history of bilateral hydronephrosis.   PAST MEDICAL HISTORY:  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 failure 08/18/2021   Diabetes mellitus without complication (HCC)    Endometrial cancer, FIGO stage IVB ([place]) 08/18/2021   Goals of care, counseling/discussion 08/18/2021   History of kidney stones    Hypertension    Leg DVT (deep venous thromboembolism), chronic, right ([place]) 08/18/2021   Pneumonia     PAST SURGICAL HISTORY: Past Surgical History:  Procedure Laterality Date   ABDOMINAL HYSTERECTOMY     CYSTOSCOPY W/ URETERAL STENT PLACEMENT Bilateral 09/01/2021   Procedure: CYSTOSCOPY WITH RETROGRADE PYELOGRAM/URETERAL STENT PLACEMENT;  Surgeon: Janith Lima, MD;  Location: [facility];  Service: Urology;  Laterality: Bilateral;   CYSTOSCOPY WITH STENT PLACEMENT Right 05/19/2022   Procedure: CYSTOSCOPY WITH STENT CHANGE;  Surgeon: Janith Lima, MD;  Location: [facility];  Service: Urology;  Laterality: Right;  ONLY NEEDS 30 MIN   HERNIA REPAIR     IR CONVERT LEFT NEPHROSTOMY TO NEPHROURETERAL CATH  11/02/2021   IR CV LINE INJECTION  08/23/2021   IR IMAGING GUIDED PORT INSERTION  11/14/2021   IR NEPHROSTOMY EXCHANGE LEFT  11/02/2021   IR NEPHROSTOMY EXCHANGE LEFT  01/20/2022   IR NEPHROSTOMY PLACEMENT LEFT  09/03/2021   IR REMOVAL TUN ACCESS W/ PORT W/O FL MOD SED  11/14/2021    FAMILY HISTORY:  Family History  Problem Relation Age of Onset   Cancer Maternal Grandmother     SOCIAL HISTORY:  Social History   Tobacco Use   Smoking status: Never   Smokeless tobacco: Never  Vaping Use   Vaping Use: Never used  Substance Use Topics   Alcohol use: Not Currently   Drug use: Never    ALLERGIES:  Allergies  Allergen Reactions   Taxotere [Docetaxel]     Throat swoll    MEDICATIONS:  Current  Outpatient Medications  Medication Sig Dispense Refill   acetaminophen (TYLENOL) 500 MG tablet Take 500 mg by mouth every 6 (six) hours as needed for mild pain.     carvedilol (COREG) 12.5 MG tablet Take 1 tablet (12.5 mg total) by mouth 2 (two) times daily with a meal. 30 tablet 5   everolimus (AFINITOR) 10 MG tablet Take 1 tablet (10 mg total) by mouth daily at 6 (six) AM. 30 tablet 3   folic acid (FOLVITE) 1 MG tablet Take 1 mg by mouth daily at 6 (six) AM.     fondaparinux (ARIXTRA) 7.5 MG/0.6ML SOLN injection Inject 0.6 mLs (7.5 mg total) into the skin daily. 18 mL 6   gabapentin (NEURONTIN) 100 MG capsule Take 1 capsule (100 mg total) by mouth 2 (two) times daily. 180 capsule 3   insulin aspart protamine - aspart (NOVOLOG MIX 70/30 FLEXPEN) (70-30) 100 UNIT/ML FlexPen Inject 20 Units into the skin as directed. Per meal     insulin detemir (LEVEMIR) 100 UNIT/ML injection Inject 0.2 mLs (20 Units total) into the skin 2 (two) times daily. 10 mL 4   letrozole (FEMARA) 2.5 MG tablet Take 2.5 mg by mouth daily at 6 (six) AM.     mirabegron ER (MYRBETRIQ) 25 MG TB24 tablet Take 1 tablet (25 mg total) by mouth daily  for 360 doses. 30 tablet 11   nitrofurantoin, macrocrystal-monohydrate, (MACROBID) 100 MG capsule Take 1 capsule (100 mg total) by mouth 2 (two) times daily. 180 capsule 6   ondansetron (ZOFRAN) 8 MG tablet Take 8 mg by mouth every 8 (eight) hours as needed.     oxybutynin (DITROPAN-XL) 10 MG 24 hr tablet Take 10 mg by mouth daily.     oxycodone (OXY-IR) 5 MG capsule Take 1 capsule (5 mg total) by mouth every 4 (four) hours as needed for pain. 120 capsule 0   polyethylene glycol (MIRALAX / GLYCOLAX) 17 g packet Take 17 g by mouth daily as needed for mild constipation. 14 each 0   prochlorperazine (COMPAZINE) 10 MG tablet Take 10 mg by mouth every 6 (six) hours as needed for nausea or vomiting.     No current facility-administered medications for this encounter.    REVIEW OF SYSTEMS:   A 10+ POINT REVIEW OF SYSTEMS WAS OBTAINED including neurology, dermatology, psychiatry, cardiac, respiratory, lymph, extremities, GI, GU, musculoskeletal, constitutional, reproductive, HEENT. ***   PHYSICAL EXAM:  vitals were not taken for this visit.   General: Alert and oriented, in no acute distress HEENT: Head is normocephalic. Extraocular movements are intact. Oropharynx is clear. Neck: Neck is supple, no palpable cervical or supraclavicular lymphadenopathy. Heart: Regular in rate and rhythm with no murmurs, rubs, or gallops. Chest: Clear to auscultation bilaterally, with no rhonchi, wheezes, or rales. Abdomen: Soft, nontender, nondistended, with no rigidity or guarding. Extremities: No cyanosis or edema. Lymphatics: see Neck Exam Skin: No concerning lesions. Musculoskeletal: symmetric strength and muscle tone throughout. Neurologic: Cranial nerves II through XII are grossly intact. No obvious focalities. Speech is fluent. Coordination is intact. Psychiatric: Judgment and insight are intact. Affect is appropriate. ***  ECOG = ***  0 - Asymptomatic (Fully active, able to carry on all predisease activities without restriction)  1 - Symptomatic but completely ambulatory (Restricted in physically strenuous activity but ambulatory and able to carry out work of a light or sedentary nature. For example, light housework, office work)  2 - Symptomatic, <50% in bed during the day (Ambulatory and capable of all self care but unable to carry out any work activities. Up and about more than 50% of waking hours)  3 - Symptomatic, >50% in bed, but not bedbound (Capable of only limited self-care, confined to bed or chair 50% or more of waking hours)  4 - Bedbound (Completely disabled. Cannot carry on any self-care. Totally confined to bed or chair)  5 - Death   Eustace Pen MM, Creech RH, Tormey DC, et al. [phone]). "Toxicity and response criteria of the [facility]". [place]  Oncol. 5 (6): 649-55  LABORATORY DATA:  Lab Results  Component Value Date   WBC 3.4 (L) 08/30/2022   HGB 5.8 (LL) 08/30/2022   HCT 19.0 (L) 08/30/2022   MCV 87.2 08/30/2022   PLT 168 08/30/2022   NEUTROABS 2.2 08/30/2022   Lab Results  Component Value Date   NA 138 08/30/2022   K 3.8 08/30/2022   CL 109 08/30/2022   CO2 16 (L) 08/30/2022   GLUCOSE 109 (H) 08/30/2022   BUN 40 (H) 08/30/2022   CREATININE 3.37 (HH) 08/30/2022   CALCIUM 9.4 08/30/2022      RADIOGRAPHY: NM PET Image Restag (PS) Skull Base To Thigh  Result Date: 08/21/2022 CLINICAL DATA:  Subsequent treatment strategy for uterine/cervical carcinoma. EXAM: NUCLEAR MEDICINE PET SKULL BASE TO THIGH TECHNIQUE: 7.5 mCi F-18 FDG  was injected intravenously. Full-ring PET imaging was performed from the skull base to thigh after the radiotracer. CT data was obtained and used for attenuation correction and anatomic localization. Fasting blood glucose: 96 mg/dl COMPARISON:  PET-CT scan 11/23/2021 FINDINGS: Mediastinal blood pool activity: SUV max 2.52 Liver activity: SUV max NA NECK: No hypermetabolic lymph nodes in the neck. Incidental CT findings: None. CHEST: Interval enlargement of bilateral hypermetabolic pulmonary masses. For example RIGHT upper lobe mass measures 3.3 cm (image 56/4) with SUV max equal 11.6 compared to 1.8 cm with SUV max equal 5.2. Similar finding in the RIGHT lower lobe with 3.7 cm hypermetabolic mass increased from 3.0 cm. LEFT upper lobe perihilar mass measures 3.7 x 2.9 cm compared to 3.8 x 1.9 cm with intense metabolic activity (SUV max equal 10.4. No new lung lesions are present. No hypermetabolic mediastinal lymph nodes. Incidental CT findings: None. ABDOMEN/PELVIS: No abnormal hypermetabolic activity within the liver, pancreas, adrenal glands, or spleen. No hypermetabolic lymph nodes in the abdomen or pelvis. Incidental CT findings: Percutaneous nephrostomy tube on the LEFT. SKELETON: Previous intense  metabolic activity within the sacrum is decreased in the interval. However, there is new intensely hypermetabolic lesion associated with soft tissue in the LEFT glenohumeral joint with SUV max equal 10.6. No clear lesion within the bone. Incidental CT findings: None. IMPRESSION: 1. Interval enlargement of bilateral hypermetabolic pulmonary masses most consistent progression of metastatic disease. 2. New hypermetabolic soft tissue in the LEFT shoulder/glenohumeral joint. Recommend correlation with trauma or inflammation. 3. Interval improvement in metabolic activity associated with the sacrum. Electronically Signed   By: Suzy Bouchard M.D.   On: 08/21/2022 16:00      IMPRESSION: Metastatic endometrial cancer: initially diagnosed in 2012 with 2 synchronous primaries of stage IC adenocarcinoma of the ovary and stage IIIC2 moderately differentiated endometrioid adenocarcinoma of the uterus              - PET showed pulmonary metastases and a new tumor involving the lower sacrum   - Recent PET performed in January 2024 shows progression of bilateral pulmonary metastases/masses in the RUL, RLL, and LUL  ***  Today, I talked to the patient and family about the findings and work-up thus far.  We discussed the natural history of *** and general treatment, highlighting the role of radiotherapy in the management.  We discussed the available radiation techniques, and focused on the details of logistics and delivery.  We reviewed the anticipated acute and late sequelae associated with radiation in this setting.  The patient was encouraged to ask questions that I answered to the best of my ability. *** A patient consent form was discussed and signed.  We retained a copy for our records.  The patient would like to proceed with radiation and will be scheduled for CT simulation.  PLAN: ***    *** minutes of total time was spent for this patient encounter, including preparation, face-to-face counseling with the  patient and coordination of care, physical exam, and documentation of the encounter.   ------------------------------------------------  Blair Promise, PhD, MD  This document serves as a record of services personally performed by Gery Pray, MD. It was created on his behalf by Roney Mans, a trained medical scribe. The creation of this record is based on the scribe's personal observations and the provider's statements to them. This document has been checked and approved by the attending provider.

## 2022-09-11 ENCOUNTER — Ambulatory Visit
Admission: RE | Admit: 2022-09-11 | Discharge: 2022-09-11 | Disposition: A | Discharge status: Home / Self Care | Payer: 59 | Source: Ambulatory Visit | Attending: Radiation Oncology | Admitting: Radiation Oncology

## 2022-09-11 ENCOUNTER — Telehealth: Payer: Self-pay | Admitting: Radiation Oncology

## 2022-09-11 ENCOUNTER — Ambulatory Visit: Admission: RE | Admit: 2022-09-11 | Payer: 59 | Source: Ambulatory Visit

## 2022-09-11 NOTE — Telephone Encounter (Signed)
2/19 @ 9:03 am Left voicemail for patient to call our office to be reschedule for f/u consult with Dr. Sondra Come. Also call pt's sister to relay message to pt to call to be r/s from today missed appt.  Pt's sister stated pt's has a fever and not feeling well pt was suppose to cancel her appt for today.  Sent message to Dr. Antony Blackbird so they are aware.

## 2022-09-12 ENCOUNTER — Telehealth: Payer: Self-pay | Admitting: Radiation Oncology

## 2022-09-12 NOTE — Telephone Encounter (Signed)
2/20 @ 10:37 am Left voicemail for patient to call our office to be r/s for consult with Dr. Sondra Come.

## 2022-09-15 NOTE — Progress Notes (Signed)
Radiation Oncology         [phone] ________________________________  Outpatient Re-Consultation  Name: Jaime Gomez MRN: [id number]  Date: 09/18/2022  DOB: [birth year]  [id number], [name], FNP  [name], MD   REFERRING PHYSICIAN: [name], MD  DIAGNOSIS: There were no encounter diagnoses.  The primary encounter diagnosis was Endometrial cancer, FIGO stage IVB ([place]). A diagnosis of Cancer, metastatic to bone [facility]) was also pertinent to this visit.  Metastatic endometrial cancer: initially diagnosed in 2012 with 2 synchronous primaries of stage IC adenocarcinoma of the ovary and stage IIIC2 moderately differentiated endometrioid adenocarcinoma of the uterus              - PET showed pulmonary metastases and a new tumor involving the lower sacrum   - Recent PET performed in January 2024 shows progression of bilateral pulmonary metastases/masses in the RUL, RLL, and LUL   Cancer Staging  Endometrial cancer, FIGO stage IVB ([place]) Staging form: Corpus Uteri - Carcinoma and Carcinosarcoma, AJCC 8th Edition - Clinical stage from 08/18/2021: FIGO Stage IVB (rcT3a, cNX, pM1) - Signed by [name], MD on 08/18/2021  Interval Since Last Radiation: almost 7 months   Intent: Palliative  Radiation Treatment Dates: 01/26/2022 through 02/09/2022  Radiation Treatment Dates: 01/26/2022 through 02/09/2022 Site Technique Total Dose (Gy) Dose per Fx (Gy) Completed Fx Beam Energies  Lumbar Spine: Spine_Sacral IMRT 30/30 3 10/10 6X    HISTORY OF PRESENT ILLNESS::Jaime Gomez is a 57 y.o. female who is accompanied by ***. she is seen as a courtesy of Dr. Marin Olp for re-evaluation and an opinion concerning further radiation therapy as part of management for her recent progression of metastatic endometrial cancer. I last met with the patient on 03/13/22 for follow-up of radiation to the site of osseous metastasis in the lumbar spine/sacrum. She had significant improvement in her pain  related to the lumbar spine lesion.   Since that time, the patient continued on combination oral chemotherapy therapy consisting of letrozole/Afinitor under Dr. Marin Olp. She required blood transfusions while undergoing treatment. Her disease remained relatively stable in the interval until evidence of disease progression indicated on her recent PET scan, detailed below.   PET scan on 08/21/22 demonstrated: an interval increase in size and hypermetabolism of the bilateral hypermetabolic pulmonary masses located in the RUL, RLL, and LUL, most consistent progression of metastatic disease. PET also showed new hypermetabolic soft tissue in the left shoulder / glenohumeral joint (likely related to her recent injury detailed below), and interval improvement in metabolic activity associated with the sacrum.   During her most recent follow-up visit with Dr. Marin Olp on 08/30/22, the patient was noted to be feeling more tired than usual. Labs reviewed showed hemoglobin at 5.8 and she was accordingly transfused. For the increasing pulmonary masses, Dr. Marin Olp recommends radiosurgery to the lesions which we will discuss in detail today. In regards to chemotherapy, the patient would understandably like to avoid systemic treatment if at all possible to preserve her quality of life.  Of note: the patient presented to the ED on 04/21/22 with left shoulder pain resulting from a fall. X-ray of the left shoulder performed showed an anterior glenohumeral dislocation with a mildly displaced proximal right humeral fracture. CT of the left shoulder also performed following left shoulder reduction showed proper placement of the left humeral head with a persistent fracture of the proximal left humerus. CT also showed a nodular consolidation of the right hilum architectural distortion bronchiolectasis, possibly related to  radiation fibrosis however underlying malignancy could not be excluded. She was discharged with a sling in stable  condition, and instructed to follow-up with OP ortho.   The patient also underwent a cytoscopy with right ureteral stent exchange on 05/19/22 related to her history of bilateral hydronephrosis.   PAST MEDICAL HISTORY:  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 failure 08/18/2021   Diabetes mellitus without complication (HCC)    Endometrial cancer, FIGO stage IVB ([place]) 08/18/2021   Goals of care, counseling/discussion 08/18/2021   History of kidney stones    Hypertension    Leg DVT (deep venous thromboembolism), chronic, right ([place]) 08/18/2021   Pneumonia     PAST SURGICAL HISTORY: Past Surgical History:  Procedure Laterality Date   ABDOMINAL HYSTERECTOMY     CYSTOSCOPY W/ URETERAL STENT PLACEMENT Bilateral 09/01/2021   Procedure: CYSTOSCOPY WITH RETROGRADE PYELOGRAM/URETERAL STENT PLACEMENT;  Surgeon: Janith Lima, MD;  Location: [facility];  Service: Urology;  Laterality: Bilateral;   CYSTOSCOPY WITH STENT PLACEMENT Right 05/19/2022   Procedure: CYSTOSCOPY WITH STENT CHANGE;  Surgeon: Janith Lima, MD;  Location: [facility];  Service: Urology;  Laterality: Right;  ONLY NEEDS 30 MIN   HERNIA REPAIR     IR CONVERT LEFT NEPHROSTOMY TO NEPHROURETERAL CATH  11/02/2021   IR CV LINE INJECTION  08/23/2021   IR IMAGING GUIDED PORT INSERTION  11/14/2021   IR NEPHROSTOMY EXCHANGE LEFT  11/02/2021   IR NEPHROSTOMY EXCHANGE LEFT  01/20/2022   IR NEPHROSTOMY PLACEMENT LEFT  09/03/2021   IR REMOVAL TUN ACCESS W/ PORT W/O FL MOD SED  11/14/2021    FAMILY HISTORY:  Family History  Problem Relation Age of Onset   Cancer Maternal Grandmother     SOCIAL HISTORY:  Social History   Tobacco Use   Smoking status: Never   Smokeless tobacco: Never  Vaping Use   Vaping Use: Never used  Substance Use Topics   Alcohol use: Not Currently   Drug use: Never    ALLERGIES:  Allergies  Allergen Reactions   Taxotere [Docetaxel]     Throat swoll    MEDICATIONS:  Current  Outpatient Medications  Medication Sig Dispense Refill   acetaminophen (TYLENOL) 500 MG tablet Take 500 mg by mouth every 6 (six) hours as needed for mild pain.     carvedilol (COREG) 12.5 MG tablet Take 1 tablet (12.5 mg total) by mouth 2 (two) times daily with a meal. 30 tablet 5   everolimus (AFINITOR) 10 MG tablet Take 1 tablet (10 mg total) by mouth daily at 6 (six) AM. 30 tablet 3   folic acid (FOLVITE) 1 MG tablet Take 1 mg by mouth daily at 6 (six) AM.     fondaparinux (ARIXTRA) 7.5 MG/0.6ML SOLN injection Inject 0.6 mLs (7.5 mg total) into the skin daily. 18 mL 6   gabapentin (NEURONTIN) 100 MG capsule Take 1 capsule (100 mg total) by mouth 2 (two) times daily. 180 capsule 3   insulin aspart protamine - aspart (NOVOLOG MIX 70/30 FLEXPEN) (70-30) 100 UNIT/ML FlexPen Inject 20 Units into the skin as directed. Per meal     insulin detemir (LEVEMIR) 100 UNIT/ML injection Inject 0.2 mLs (20 Units total) into the skin 2 (two) times daily. 10 mL 4   letrozole (FEMARA) 2.5 MG tablet Take 2.5 mg by mouth daily at 6 (six) AM.     mirabegron ER (MYRBETRIQ) 25 MG TB24 tablet Take 1 tablet (25 mg total) by mouth daily  for 360 doses. 30 tablet 11   nitrofurantoin, macrocrystal-monohydrate, (MACROBID) 100 MG capsule Take 1 capsule (100 mg total) by mouth 2 (two) times daily. 180 capsule 6   ondansetron (ZOFRAN) 8 MG tablet Take 8 mg by mouth every 8 (eight) hours as needed.     oxybutynin (DITROPAN-XL) 10 MG 24 hr tablet Take 10 mg by mouth daily.     oxycodone (OXY-IR) 5 MG capsule Take 1 capsule (5 mg total) by mouth every 4 (four) hours as needed for pain. 120 capsule 0   polyethylene glycol (MIRALAX / GLYCOLAX) 17 g packet Take 17 g by mouth daily as needed for mild constipation. 14 each 0   prochlorperazine (COMPAZINE) 10 MG tablet Take 10 mg by mouth every 6 (six) hours as needed for nausea or vomiting.     No current facility-administered medications for this encounter.    REVIEW OF SYSTEMS:   A 10+ POINT REVIEW OF SYSTEMS WAS OBTAINED including neurology, dermatology, psychiatry, cardiac, respiratory, lymph, extremities, GI, GU, musculoskeletal, constitutional, reproductive, HEENT. ***   PHYSICAL EXAM:  vitals were not taken for this visit.   General: Alert and oriented, in no acute distress HEENT: Head is normocephalic. Extraocular movements are intact. Oropharynx is clear. Neck: Neck is supple, no palpable cervical or supraclavicular lymphadenopathy. Heart: Regular in rate and rhythm with no murmurs, rubs, or gallops. Chest: Clear to auscultation bilaterally, with no rhonchi, wheezes, or rales. Abdomen: Soft, nontender, nondistended, with no rigidity or guarding. Extremities: No cyanosis or edema. Lymphatics: see Neck Exam Skin: No concerning lesions. Musculoskeletal: symmetric strength and muscle tone throughout. Neurologic: Cranial nerves II through XII are grossly intact. No obvious focalities. Speech is fluent. Coordination is intact. Psychiatric: Judgment and insight are intact. Affect is appropriate. ***  ECOG = ***  0 - Asymptomatic (Fully active, able to carry on all predisease activities without restriction)  1 - Symptomatic but completely ambulatory (Restricted in physically strenuous activity but ambulatory and able to carry out work of a light or sedentary nature. For example, light housework, office work)  2 - Symptomatic, <50% in bed during the day (Ambulatory and capable of all self care but unable to carry out any work activities. Up and about more than 50% of waking hours)  3 - Symptomatic, >50% in bed, but not bedbound (Capable of only limited self-care, confined to bed or chair 50% or more of waking hours)  4 - Bedbound (Completely disabled. Cannot carry on any self-care. Totally confined to bed or chair)  5 - Death   Eustace Pen MM, Creech RH, Tormey DC, et al. [phone]). "Toxicity and response criteria of the [facility]". [place]  Oncol. 5 (6): 649-55  LABORATORY DATA:  Lab Results  Component Value Date   WBC 3.4 (L) 08/30/2022   HGB 5.8 (LL) 08/30/2022   HCT 19.0 (L) 08/30/2022   MCV 87.2 08/30/2022   PLT 168 08/30/2022   NEUTROABS 2.2 08/30/2022   Lab Results  Component Value Date   NA 138 08/30/2022   K 3.8 08/30/2022   CL 109 08/30/2022   CO2 16 (L) 08/30/2022   GLUCOSE 109 (H) 08/30/2022   BUN 40 (H) 08/30/2022   CREATININE 3.37 (HH) 08/30/2022   CALCIUM 9.4 08/30/2022      RADIOGRAPHY: NM PET Image Restag (PS) Skull Base To Thigh  Result Date: 08/21/2022 CLINICAL DATA:  Subsequent treatment strategy for uterine/cervical carcinoma. EXAM: NUCLEAR MEDICINE PET SKULL BASE TO THIGH TECHNIQUE: 7.5 mCi F-18 FDG  was injected intravenously. Full-ring PET imaging was performed from the skull base to thigh after the radiotracer. CT data was obtained and used for attenuation correction and anatomic localization. Fasting blood glucose: 96 mg/dl COMPARISON:  PET-CT scan 11/23/2021 FINDINGS: Mediastinal blood pool activity: SUV max 2.52 Liver activity: SUV max NA NECK: No hypermetabolic lymph nodes in the neck. Incidental CT findings: None. CHEST: Interval enlargement of bilateral hypermetabolic pulmonary masses. For example RIGHT upper lobe mass measures 3.3 cm (image 56/4) with SUV max equal 11.6 compared to 1.8 cm with SUV max equal 5.2. Similar finding in the RIGHT lower lobe with 3.7 cm hypermetabolic mass increased from 3.0 cm. LEFT upper lobe perihilar mass measures 3.7 x 2.9 cm compared to 3.8 x 1.9 cm with intense metabolic activity (SUV max equal 10.4. No new lung lesions are present. No hypermetabolic mediastinal lymph nodes. Incidental CT findings: None. ABDOMEN/PELVIS: No abnormal hypermetabolic activity within the liver, pancreas, adrenal glands, or spleen. No hypermetabolic lymph nodes in the abdomen or pelvis. Incidental CT findings: Percutaneous nephrostomy tube on the LEFT. SKELETON: Previous intense  metabolic activity within the sacrum is decreased in the interval. However, there is new intensely hypermetabolic lesion associated with soft tissue in the LEFT glenohumeral joint with SUV max equal 10.6. No clear lesion within the bone. Incidental CT findings: None. IMPRESSION: 1. Interval enlargement of bilateral hypermetabolic pulmonary masses most consistent progression of metastatic disease. 2. New hypermetabolic soft tissue in the LEFT shoulder/glenohumeral joint. Recommend correlation with trauma or inflammation. 3. Interval improvement in metabolic activity associated with the sacrum. Electronically Signed   By: Suzy Bouchard M.D.   On: 08/21/2022 16:00      IMPRESSION: Metastatic endometrial cancer: initially diagnosed in 2012 with 2 synchronous primaries of stage IC adenocarcinoma of the ovary and stage IIIC2 moderately differentiated endometrioid adenocarcinoma of the uterus              - PET showed pulmonary metastases and a new tumor involving the lower sacrum   - Recent PET performed in January 2024 shows progression of bilateral pulmonary metastases/masses in the RUL, RLL, and LUL  ***  Today, I talked to the patient and family about the findings and work-up thus far.  We discussed the natural history of *** and general treatment, highlighting the role of radiotherapy in the management.  We discussed the available radiation techniques, and focused on the details of logistics and delivery.  We reviewed the anticipated acute and late sequelae associated with radiation in this setting.  The patient was encouraged to ask questions that I answered to the best of my ability. *** A patient consent form was discussed and signed.  We retained a copy for our records.  The patient would like to proceed with radiation and will be scheduled for CT simulation.  PLAN: ***    *** minutes of total time was spent for this patient encounter, including preparation, face-to-face counseling with the  patient and coordination of care, physical exam, and documentation of the encounter.   ------------------------------------------------  Blair Promise, PhD, MD  This document serves as a record of services personally performed by Gery Pray, MD. It was created on his behalf by Roney Mans, a trained medical scribe. The creation of this record is based on the scribe's personal observations and the provider's statements to them. This document has been checked and approved by the attending provider.

## 2022-09-15 NOTE — Progress Notes (Incomplete)
Histology and Location of Primary Cancer:  Metastatic endometrial cancer    Location(s) of Symptomatic tumor(s):  PET Scan  08/21/2022 --IMPRESSION: Interval enlargement of bilateral hypermetabolic pulmonary masses most consistent progression of metastatic disease. New hypermetabolic soft tissue in the LEFT shoulder/glenohumeral joint. Recommend correlation with trauma or inflammation. Interval improvement in metabolic activity associated with the sacrum.   Past/Anticipated chemotherapy by medical oncology, if any:  Under care of Dr. Burney Gauze 08/30/2022 She is on Afinitor/letrozole.  The PET scan that she had done back in May shows that she was progressing. Unfortunately, we do not have any actionable mutations that we could try to target. It is certainly conceivable that the Afinitor/letrozole is starting to lose its effectiveness.  It would be very interesting to see what the CA-125 looks like. Hopefully, Radiation Oncology can do radiosurgery on these lung lesions. We are going to have to give her blood.  We do not transfuse her for several months. Everything that we do really is quality of life. 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 She really hates this nephrostomy tube.  I am not sure if Urology will be able to get this taken out. Again, our options for systemic therapy or not all that great.  I know she would like to avoid chemotherapy.  I totally understand this. I would like to get her back in about 3 to 4 weeks. If Radiation Oncology cannot do radiosurgery for the lung lesions, we just may have to "bite the bullet" and try to treat her with another systemic therapy. I must say that given her issues, she really does look quite good.  She also has great support at home.  She is really motivated.    Patient's main complaints related to symptomatic tumor(s) are: ***   Signs/Symptoms Weight changes, if any: *** Respiratory complaints, if any: *** Hemoptysis, if  any: *** Pain issues, if any:  ***   SAFETY ISSUES: Prior radiation? Yes 01/26/2022 through 02/09/2022 Site Technique Total Dose (Gy) Dose per Fx (Gy) Completed Fx Beam Energies  Lumbar Spine: Spine_Sacral IMRT 30/30 3 10/10 6X  4500 cGy in 25 fractions in 2012, vaginal brachytherapy in [place] completed 06/21/2011    Pacemaker/ICD? No Possible current pregnancy? No--hysterectomy Is the patient on methotrexate? No   Additional Complaints / other details:  ***

## 2022-09-18 ENCOUNTER — Ambulatory Visit
Admission: RE | Admit: 2022-09-18 | Discharge: 2022-09-18 | Disposition: A | Discharge status: Home / Self Care | Payer: 59 | Source: Ambulatory Visit | Attending: Radiation Oncology | Admitting: Radiation Oncology

## 2022-09-18 ENCOUNTER — Encounter: Payer: Self-pay | Admitting: Radiation Oncology

## 2022-09-18 VITALS — BP 140/84 | HR 86 | Temp 98.4°F | Resp 17 | Ht 65.0 in | Wt 140.2 lb

## 2022-09-18 DIAGNOSIS — C7951 Secondary malignant neoplasm of bone: Secondary | ICD-10-CM | POA: Diagnosis not present

## 2022-09-18 DIAGNOSIS — Z79899 Other long term (current) drug therapy: Secondary | ICD-10-CM | POA: Insufficient documentation

## 2022-09-18 DIAGNOSIS — C7801 Secondary malignant neoplasm of right lung: Secondary | ICD-10-CM | POA: Insufficient documentation

## 2022-09-18 DIAGNOSIS — Z79811 Long term (current) use of aromatase inhibitors: Secondary | ICD-10-CM | POA: Diagnosis not present

## 2022-09-18 DIAGNOSIS — C541 Malignant neoplasm of endometrium: Secondary | ICD-10-CM

## 2022-09-18 DIAGNOSIS — D631 Anemia in chronic kidney disease: Secondary | ICD-10-CM | POA: Insufficient documentation

## 2022-09-18 DIAGNOSIS — Z923 Personal history of irradiation: Secondary | ICD-10-CM | POA: Diagnosis not present

## 2022-09-18 DIAGNOSIS — Z87442 Personal history of urinary calculi: Secondary | ICD-10-CM | POA: Diagnosis not present

## 2022-09-18 DIAGNOSIS — I129 Hypertensive chronic kidney disease with stage 1 through stage 4 chronic kidney disease, or unspecified chronic kidney disease: Secondary | ICD-10-CM | POA: Diagnosis not present

## 2022-09-18 DIAGNOSIS — C7802 Secondary malignant neoplasm of left lung: Secondary | ICD-10-CM | POA: Insufficient documentation

## 2022-09-18 NOTE — Progress Notes (Signed)
Histology and Location of Primary Cancer:  Metastatic endometrial cancer    Location(s) of Symptomatic tumor(s):  PET Scan  08/21/2022 --IMPRESSION: Interval enlargement of bilateral hypermetabolic pulmonary masses most consistent progression of metastatic disease. New hypermetabolic soft tissue in the LEFT shoulder/glenohumeral joint. Recommend correlation with trauma or inflammation. Interval improvement in metabolic activity associated with the sacrum.   Past/Anticipated chemotherapy by medical oncology, if any:  Under care of Dr. Burney Gauze 08/30/2022 She is on Afinitor/letrozole.  The PET scan that she had done back in May shows that she was progressing. Unfortunately, we do not have any actionable mutations that we could try to target. It is certainly conceivable that the Afinitor/letrozole is starting to lose its effectiveness.  It would be very interesting to see what the CA-125 looks like. Hopefully, Radiation Oncology can do radiosurgery on these lung lesions. We are going to have to give her blood.  We do not transfuse her for several months. Everything that we do really is quality of life. 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 She really hates this nephrostomy tube.  I am not sure if Urology will be able to get this taken out. Again, our options for systemic therapy or not all that great.  I know she would like to avoid chemotherapy.  I totally understand this. I would like to get her back in about 3 to 4 weeks. If Radiation Oncology cannot do radiosurgery for the lung lesions, we just may have to "bite the bullet" and try to treat her with another systemic therapy. I must say that given her issues, she really does look quite good.  She also has great support at home.  She is really motivated.    Patient's main complaints related to symptomatic tumor(s) are:    Signs/Symptoms Weight changes, if any: lost 10 pounds Respiratory complaints, if any:  none Hemoptysis, if any: none Pain issues, if any:  none   SAFETY ISSUES: Prior radiation? Yes 01/26/2022 through 02/09/2022 Site Technique Total Dose (Gy) Dose per Fx (Gy) Completed Fx Beam Energies  Lumbar Spine: Spine_Sacral IMRT 30/30 3 10/10 6X  4500 cGy in 25 fractions in 2012, vaginal brachytherapy in [place] completed 06/21/2011    Pacemaker/ICD? No Possible current pregnancy? No--hysterectomy Is the patient on methotrexate? No   Additional Complaints / other details:  none Vitals:   09/18/22 0815  BP: (!) 140/84  Pulse: 86  Resp: 17  Temp: 98.4 F (36.9 C)  SpO2: 100%  Weight: 63.6 kg  Height: '5\' 5"'$  (1.651 m)

## 2022-09-19 ENCOUNTER — Encounter (HOSPITAL_COMMUNITY): Payer: Self-pay

## 2022-09-19 ENCOUNTER — Inpatient Hospital Stay (HOSPITAL_COMMUNITY): Admission: RE | Admit: 2022-09-19 | Payer: BC Managed Care – PPO | Source: Ambulatory Visit

## 2022-09-25 DIAGNOSIS — C541 Malignant neoplasm of endometrium: Secondary | ICD-10-CM | POA: Diagnosis not present

## 2022-09-25 DIAGNOSIS — C7801 Secondary malignant neoplasm of right lung: Secondary | ICD-10-CM | POA: Diagnosis not present

## 2022-09-25 DIAGNOSIS — C7951 Secondary malignant neoplasm of bone: Secondary | ICD-10-CM | POA: Diagnosis not present

## 2022-09-26 ENCOUNTER — Ambulatory Visit
Admission: RE | Admit: 2022-09-26 | Discharge: 2022-09-26 | Disposition: A | Discharge status: Home / Self Care | Payer: 59 | Source: Ambulatory Visit | Attending: Radiation Oncology | Admitting: Radiation Oncology

## 2022-09-26 DIAGNOSIS — C7802 Secondary malignant neoplasm of left lung: Secondary | ICD-10-CM | POA: Insufficient documentation

## 2022-09-26 DIAGNOSIS — Z510 Encounter for antineoplastic radiation therapy: Secondary | ICD-10-CM | POA: Diagnosis not present

## 2022-09-26 DIAGNOSIS — C7951 Secondary malignant neoplasm of bone: Secondary | ICD-10-CM | POA: Diagnosis not present

## 2022-09-26 DIAGNOSIS — C7801 Secondary malignant neoplasm of right lung: Secondary | ICD-10-CM | POA: Insufficient documentation

## 2022-09-26 DIAGNOSIS — C541 Malignant neoplasm of endometrium: Secondary | ICD-10-CM | POA: Diagnosis not present

## 2022-09-28 ENCOUNTER — Other Ambulatory Visit: Payer: Self-pay

## 2022-09-28 ENCOUNTER — Inpatient Hospital Stay: Payer: 59 | Admitting: Hematology & Oncology

## 2022-09-28 ENCOUNTER — Inpatient Hospital Stay: Payer: 59

## 2022-09-28 DIAGNOSIS — D631 Anemia in chronic kidney disease: Secondary | ICD-10-CM

## 2022-10-03 DIAGNOSIS — C541 Malignant neoplasm of endometrium: Secondary | ICD-10-CM | POA: Diagnosis not present

## 2022-10-03 DIAGNOSIS — C7951 Secondary malignant neoplasm of bone: Secondary | ICD-10-CM | POA: Diagnosis not present

## 2022-10-03 DIAGNOSIS — C7802 Secondary malignant neoplasm of left lung: Secondary | ICD-10-CM | POA: Diagnosis not present

## 2022-10-03 DIAGNOSIS — C7801 Secondary malignant neoplasm of right lung: Secondary | ICD-10-CM | POA: Diagnosis not present

## 2022-10-03 DIAGNOSIS — Z510 Encounter for antineoplastic radiation therapy: Secondary | ICD-10-CM | POA: Diagnosis not present

## 2022-10-11 ENCOUNTER — Inpatient Hospital Stay: Payer: 59

## 2022-10-11 ENCOUNTER — Inpatient Hospital Stay: Payer: 59 | Attending: Hematology & Oncology

## 2022-10-11 ENCOUNTER — Inpatient Hospital Stay: Payer: 59 | Admitting: Hematology & Oncology

## 2022-10-11 ENCOUNTER — Encounter: Payer: Self-pay | Admitting: Hematology & Oncology

## 2022-10-11 ENCOUNTER — Other Ambulatory Visit: Payer: Self-pay

## 2022-10-11 ENCOUNTER — Encounter: Payer: Self-pay | Admitting: Radiation Oncology

## 2022-10-11 VITALS — BP 141/72 | HR 95 | Temp 98.5°F | Resp 18 | Ht 65.0 in | Wt 136.0 lb

## 2022-10-11 DIAGNOSIS — C541 Malignant neoplasm of endometrium: Secondary | ICD-10-CM | POA: Diagnosis not present

## 2022-10-11 DIAGNOSIS — Z79811 Long term (current) use of aromatase inhibitors: Secondary | ICD-10-CM | POA: Diagnosis not present

## 2022-10-11 DIAGNOSIS — D631 Anemia in chronic kidney disease: Secondary | ICD-10-CM

## 2022-10-11 DIAGNOSIS — N189 Chronic kidney disease, unspecified: Secondary | ICD-10-CM | POA: Insufficient documentation

## 2022-10-11 DIAGNOSIS — Z7901 Long term (current) use of anticoagulants: Secondary | ICD-10-CM | POA: Diagnosis not present

## 2022-10-11 DIAGNOSIS — R918 Other nonspecific abnormal finding of lung field: Secondary | ICD-10-CM | POA: Diagnosis not present

## 2022-10-11 DIAGNOSIS — Z95828 Presence of other vascular implants and grafts: Secondary | ICD-10-CM

## 2022-10-11 LAB — CBC WITH DIFFERENTIAL (CANCER CENTER ONLY)
Abs Immature Granulocytes: 0.05 10*3/uL (ref 0.00–0.07)
Basophils Absolute: 0 10*3/uL (ref 0.0–0.1)
Basophils Relative: 1 %
Eosinophils Absolute: 0 10*3/uL (ref 0.0–0.5)
Eosinophils Relative: 1 %
HCT: 23.9 % — ABNORMAL LOW (ref 36.0–46.0)
Hemoglobin: 7.9 g/dL — ABNORMAL LOW (ref 12.0–15.0)
Immature Granulocytes: 1 %
Lymphocytes Relative: 15 %
Lymphs Abs: 0.6 10*3/uL — ABNORMAL LOW (ref 0.7–4.0)
MCH: 27.2 pg (ref 26.0–34.0)
MCHC: 33.1 g/dL (ref 30.0–36.0)
MCV: 82.4 fL (ref 80.0–100.0)
Monocytes Absolute: 0.3 10*3/uL (ref 0.1–1.0)
Monocytes Relative: 7 %
Neutro Abs: 2.9 10*3/uL (ref 1.7–7.7)
Neutrophils Relative %: 75 %
Platelet Count: 171 10*3/uL (ref 150–400)
RBC: 2.9 MIL/uL — ABNORMAL LOW (ref 3.87–5.11)
RDW: 15.2 % (ref 11.5–15.5)
WBC Count: 3.9 10*3/uL — ABNORMAL LOW (ref 4.0–10.5)
nRBC: 0 % (ref 0.0–0.2)

## 2022-10-11 LAB — CMP (CANCER CENTER ONLY)
ALT: 5 U/L (ref 0–44)
AST: 10 U/L — ABNORMAL LOW (ref 15–41)
Albumin: 3.9 g/dL (ref 3.5–5.0)
Alkaline Phosphatase: 81 U/L (ref 38–126)
Anion gap: 16 — ABNORMAL HIGH (ref 5–15)
BUN: 61 mg/dL — ABNORMAL HIGH (ref 6–20)
CO2: 15 mmol/L — ABNORMAL LOW (ref 22–32)
Calcium: 9.2 mg/dL (ref 8.9–10.3)
Chloride: 103 mmol/L (ref 98–111)
Creatinine: 4.07 mg/dL — ABNORMAL HIGH (ref 0.44–1.00)
GFR, Estimated: 12 mL/min — ABNORMAL LOW (ref >60)
Glucose, Bld: 245 mg/dL — ABNORMAL HIGH (ref 70–99)
Potassium: 3.1 mmol/L — ABNORMAL LOW (ref 3.5–5.1)
Sodium: 134 mmol/L — ABNORMAL LOW (ref 135–145)
Total Bilirubin: 0.4 mg/dL (ref 0.3–1.2)
Total Protein: 7.5 g/dL (ref 6.5–8.1)

## 2022-10-11 LAB — RETICULOCYTES
Immature Retic Fract: 9.4 % (ref 2.3–15.9)
RBC.: 2.93 MIL/uL — ABNORMAL LOW (ref 3.87–5.11)
Retic Count, Absolute: 21.4 10*3/uL (ref 19.0–186.0)
Retic Ct Pct: 0.7 % (ref 0.4–3.1)

## 2022-10-11 LAB — LACTATE DEHYDROGENASE: LDH: 173 U/L (ref 98–192)

## 2022-10-11 LAB — SAMPLE TO BLOOD BANK

## 2022-10-11 LAB — FERRITIN: Ferritin: 3448 ng/mL — ABNORMAL HIGH (ref 11–307)

## 2022-10-11 MED ORDER — SODIUM CHLORIDE 0.9% FLUSH
10.0000 mL | INTRAVENOUS | Status: DC | PRN
  Administered 2022-10-11: 10 mL via INTRAVENOUS

## 2022-10-11 MED ORDER — HEPARIN SOD (PORK) LOCK FLUSH 100 UNIT/ML IV SOLN
500.0000 [IU] | Freq: Once | INTRAVENOUS | Status: AC
  Administered 2022-10-11: 500 [IU] via INTRAVENOUS

## 2022-10-11 NOTE — Progress Notes (Signed)
Hematology and Oncology Follow Up Visit  Jaime Gomez [id number] 02-Jun-1966 57 y.o. 10/11/2022   Principle Diagnosis:  Metastatic adenocarcinoma of the endometrium -- No Actionable mutations Chronic renal failure Recurrent thromboembolic disease  Current Therapy:   Afinitor/letrozole Blood transfusion as needed Eliquis 2.5 mg p.o. twice daily --increase to 5 mg p.o. twice daily on 03/03/2022 -- d/c on 03/13/2022 Arixtra 7.5 mg sq q day --  start on 03/13/2022     Interim History:  Jaime Gomez is back for follow-up.  She seemed to be doing pretty well.  She really has no specific complaints since we last saw her.  I think we last saw her back in early February.  She is doing well on the everolimus and the Femara.  She does not complain of any mouth sores.  She is having no problems with fever.  Exam no problems with nausea or vomiting.  Her last CA-125 was I think further elevated at 377.  She apparently is going to have radiosurgery for some lung nodules.  Hopefully, this can be done with radiosurgery.  She has not had any cough.  She does smoke.  She has had no issues with her bowels or bladder.  She does have some urostomy tubes in.  She has a indwelling catheter for urostomy tube.  She did get transfused last time that we saw her.  Her last hemoglobin was 5.8.  Currently, she may want to go out to [place].  She has some family friends who live in [place].  I think she is going to want to go out there in May.  Overall, I think her performance status is probably ECOG 1.     Medications:  Current Outpatient Medications:    acetaminophen (TYLENOL) 500 MG tablet, Take 500 mg by mouth every 6 (six) hours as needed for mild pain., Disp: , Rfl:    carvedilol (COREG) 12.5 MG tablet, Take 1 tablet (12.5 mg total) by mouth 2 (two) times daily with a meal., Disp: 30 tablet, Rfl: 5   everolimus (AFINITOR) 10 MG tablet, Take 1 tablet (10 mg total) by mouth daily at 6 (six) AM., Disp: 30  tablet, Rfl: 3   folic acid (FOLVITE) 1 MG tablet, Take 1 mg by mouth daily at 6 (six) AM., Disp: , Rfl:    fondaparinux (ARIXTRA) 7.5 MG/0.6ML SOLN injection, Inject 0.6 mLs (7.5 mg total) into the skin daily., Disp: 18 mL, Rfl: 6   gabapentin (NEURONTIN) 100 MG capsule, Take 1 capsule (100 mg total) by mouth 2 (two) times daily., Disp: 180 capsule, Rfl: 3   insulin aspart protamine - aspart (NOVOLOG MIX 70/30 FLEXPEN) (70-30) 100 UNIT/ML FlexPen, Inject 20 Units into the skin as directed. Per meal, Disp: , Rfl:    insulin detemir (LEVEMIR) 100 UNIT/ML injection, Inject 0.2 mLs (20 Units total) into the skin 2 (two) times daily., Disp: 10 mL, Rfl: 4   nitrofurantoin, macrocrystal-monohydrate, (MACROBID) 100 MG capsule, Take 1 capsule (100 mg total) by mouth 2 (two) times daily., Disp: 180 capsule, Rfl: 6   ondansetron (ZOFRAN) 8 MG tablet, Take 8 mg by mouth every 8 (eight) hours as needed., Disp: , Rfl:    oxybutynin (DITROPAN-XL) 10 MG 24 hr tablet, Take 10 mg by mouth daily., Disp: , Rfl:    oxycodone (OXY-IR) 5 MG capsule, Take 1 capsule (5 mg total) by mouth every 4 (four) hours as needed for pain., Disp: 120 capsule, Rfl: 0   polyethylene glycol (MIRALAX / GLYCOLAX)  17 g packet, Take 17 g by mouth daily as needed for mild constipation., Disp: 14 each, Rfl: 0   prochlorperazine (COMPAZINE) 10 MG tablet, Take 10 mg by mouth every 6 (six) hours as needed for nausea or vomiting., Disp: , Rfl:    letrozole (FEMARA) 2.5 MG tablet, Take 2.5 mg by mouth daily at 6 (six) AM. (Patient not taking: Reported on 09/18/2022), Disp: , Rfl:    mirabegron ER (MYRBETRIQ) 25 MG TB24 tablet, Take 1 tablet (25 mg total) by mouth daily for 360 doses., Disp: 30 tablet, Rfl: 11  Allergies:  Allergies  Allergen Reactions   Taxotere [Docetaxel] Swelling    Throat swelling    Past Medical History, Surgical history, Social history, and Family History were reviewed and updated.  Review of Systems: Review of Systems   Constitutional:  Positive for fatigue.  HENT:  Negative.    Eyes: Negative.   Respiratory: Negative.    Cardiovascular:  Positive for leg swelling.  Gastrointestinal:  Positive for abdominal pain.  Genitourinary:  Positive for pelvic pain.   Musculoskeletal: Negative.   Skin: Negative.   Neurological: Negative.   Hematological: Negative.   Psychiatric/Behavioral: Negative.      Physical Exam:  height is 5\' 5"  (1.651 m) and weight is 136 lb (61.7 kg). Her oral temperature is 98.5 F (36.9 C). Her blood pressure is 141/72 (abnormal) and her pulse is 95. Her respiration is 18 and oxygen saturation is 100%.   Wt Readings from Last 3 Encounters:  10/11/22 136 lb (61.7 kg)  09/18/22 140 lb 3.2 oz (63.6 kg)  08/30/22 149 lb (67.6 kg)    Physical Exam Vitals reviewed.  HENT:     Head: Normocephalic and atraumatic.  Eyes:     Pupils: Pupils are equal, round, and reactive to light.  Cardiovascular:     Rate and Rhythm: Normal rate and regular rhythm.     Heart sounds: Normal heart sounds.  Pulmonary:     Effort: Pulmonary effort is normal.     Breath sounds: Normal breath sounds.  Abdominal:     General: Bowel sounds are normal.     Palpations: Abdomen is soft.  Musculoskeletal:        General: No tenderness or deformity. Normal range of motion.     Cervical back: Normal range of motion.  Lymphadenopathy:     Cervical: No cervical adenopathy.  Skin:    General: Skin is warm and dry.     Findings: No erythema or rash.  Neurological:     Mental Status: She is alert and oriented to person, place, and time.  Psychiatric:        Behavior: Behavior normal.        Thought Content: Thought content normal.        Judgment: Judgment normal.     Lab Results  Component Value Date   WBC 3.9 (L) 10/11/2022   HGB 7.9 (L) 10/11/2022   HCT 23.9 (L) 10/11/2022   MCV 82.4 10/11/2022   PLT 171 10/11/2022     Chemistry      Component Value Date/Time   NA 138 08/30/2022 1230   K  3.8 08/30/2022 1230   CL 109 08/30/2022 1230   CO2 16 (L) 08/30/2022 1230   BUN 40 (H) 08/30/2022 1230   CREATININE 3.37 (HH) 08/30/2022 1230      Component Value Date/Time   CALCIUM 9.4 08/30/2022 1230   ALKPHOS 79 08/30/2022 1230   AST 11 (L) 08/30/2022  1230   ALT <5 08/30/2022 1230   BILITOT 0.3 08/30/2022 1230      Impression and Plan: Ms. Grodin is a very charming 57 year old white female.  She has metastatic endometrial cancer.  She is on Afinitor/letrozole.  The PET scan that she had done back in May shows that she was progressing. Unfortunately, we do not have any actionable mutations that we could try to target.  It is certainly conceivable that the Afinitor/letrozole is starting to lose its effectiveness.  It would be very interesting to see what the CA-125 looks like.  We will see what Radiation Oncology can do for these spots on her lung.  I would not think that she would be ready for any kind of scans for at least a month or so after the radiation is finished.  I forgot to mention that she is on Arixtra for thromboembolic disease.  I think she is doing okay with this.  She does not think she needs a transfusion.  We will just follow this.  She will continue on the everolimus/Femara.  I think this is agreeable with her.  I will like to get her back in another month or so.     [name], MD 3/20/20241:39 PM

## 2022-10-11 NOTE — Patient Instructions (Signed)

## 2022-10-12 ENCOUNTER — Ambulatory Visit
Admission: RE | Admit: 2022-10-12 | Discharge: 2022-10-12 | Disposition: A | Discharge status: Home / Self Care | Payer: 59 | Source: Ambulatory Visit | Attending: Radiation Oncology | Admitting: Radiation Oncology

## 2022-10-12 ENCOUNTER — Other Ambulatory Visit: Payer: Self-pay

## 2022-10-12 DIAGNOSIS — C7801 Secondary malignant neoplasm of right lung: Secondary | ICD-10-CM | POA: Diagnosis not present

## 2022-10-12 DIAGNOSIS — C7802 Secondary malignant neoplasm of left lung: Secondary | ICD-10-CM | POA: Diagnosis not present

## 2022-10-12 DIAGNOSIS — Z510 Encounter for antineoplastic radiation therapy: Secondary | ICD-10-CM | POA: Diagnosis not present

## 2022-10-12 DIAGNOSIS — C541 Malignant neoplasm of endometrium: Secondary | ICD-10-CM

## 2022-10-12 DIAGNOSIS — C7951 Secondary malignant neoplasm of bone: Secondary | ICD-10-CM | POA: Diagnosis not present

## 2022-10-12 LAB — RAD ONC ARIA SESSION SUMMARY
Course Elapsed Days: 0
Plan Fractions Treated to Date: 1
Plan Prescribed Dose Per Fraction: 5 Gy
Plan Total Fractions Prescribed: 10
Plan Total Prescribed Dose: 50 Gy
Reference Point Dosage Given to Date: 5 Gy
Reference Point Session Dosage Given: 5 Gy
Session Number: 1

## 2022-10-12 LAB — IRON AND IRON BINDING CAPACITY (CC-WL,HP ONLY)
Iron: 29 ug/dL (ref 28–170)
Saturation Ratios: 16 % (ref 10.4–31.8)
TIBC: 178 ug/dL — ABNORMAL LOW (ref 250–450)
UIBC: 149 ug/dL (ref 148–442)

## 2022-10-13 ENCOUNTER — Ambulatory Visit: Payer: 59 | Admitting: Radiation Oncology

## 2022-10-13 ENCOUNTER — Other Ambulatory Visit: Payer: Self-pay | Admitting: *Deleted

## 2022-10-13 DIAGNOSIS — C541 Malignant neoplasm of endometrium: Secondary | ICD-10-CM

## 2022-10-13 LAB — CA 125: Cancer Antigen (CA) 125: 518 U/mL — ABNORMAL HIGH (ref 0.0–38.1)

## 2022-10-13 MED ORDER — OXYCODONE HCL 5 MG PO CAPS: 5.0000 mg | ORAL_CAPSULE | ORAL | 0 refills | Status: DC | PRN

## 2022-10-16 ENCOUNTER — Telehealth: Payer: Self-pay | Admitting: Radiation Oncology

## 2022-10-16 ENCOUNTER — Ambulatory Visit: Payer: 59 | Admitting: Radiation Oncology

## 2022-10-16 NOTE — Telephone Encounter (Signed)
Pt called apologizing for missing her appt. Pt states she lost her keys and has been unable to locate them. L1 notified of this via email.

## 2022-10-17 ENCOUNTER — Other Ambulatory Visit: Payer: Self-pay

## 2022-10-17 ENCOUNTER — Ambulatory Visit
Admission: RE | Admit: 2022-10-17 | Discharge: 2022-10-17 | Disposition: A | Discharge status: Home / Self Care | Payer: 59 | Source: Ambulatory Visit | Attending: Radiation Oncology | Admitting: Radiation Oncology

## 2022-10-17 DIAGNOSIS — C7802 Secondary malignant neoplasm of left lung: Secondary | ICD-10-CM | POA: Diagnosis not present

## 2022-10-17 DIAGNOSIS — C541 Malignant neoplasm of endometrium: Secondary | ICD-10-CM | POA: Diagnosis not present

## 2022-10-17 DIAGNOSIS — C7801 Secondary malignant neoplasm of right lung: Secondary | ICD-10-CM | POA: Diagnosis not present

## 2022-10-17 DIAGNOSIS — Z510 Encounter for antineoplastic radiation therapy: Secondary | ICD-10-CM | POA: Diagnosis not present

## 2022-10-17 DIAGNOSIS — C7951 Secondary malignant neoplasm of bone: Secondary | ICD-10-CM | POA: Diagnosis not present

## 2022-10-17 LAB — RAD ONC ARIA SESSION SUMMARY
Course Elapsed Days: 5
Plan Fractions Treated to Date: 2
Plan Prescribed Dose Per Fraction: 5 Gy
Plan Total Fractions Prescribed: 10
Plan Total Prescribed Dose: 50 Gy
Reference Point Dosage Given to Date: 10 Gy
Reference Point Session Dosage Given: 5 Gy
Session Number: 2

## 2022-10-18 ENCOUNTER — Other Ambulatory Visit: Payer: Self-pay

## 2022-10-18 ENCOUNTER — Ambulatory Visit
Admission: RE | Admit: 2022-10-18 | Discharge: 2022-10-18 | Disposition: A | Discharge status: Home / Self Care | Payer: 59 | Source: Ambulatory Visit | Attending: Radiation Oncology | Admitting: Radiation Oncology

## 2022-10-18 DIAGNOSIS — C541 Malignant neoplasm of endometrium: Secondary | ICD-10-CM | POA: Diagnosis not present

## 2022-10-18 DIAGNOSIS — C7801 Secondary malignant neoplasm of right lung: Secondary | ICD-10-CM | POA: Diagnosis not present

## 2022-10-18 DIAGNOSIS — C7802 Secondary malignant neoplasm of left lung: Secondary | ICD-10-CM | POA: Diagnosis not present

## 2022-10-18 DIAGNOSIS — C7951 Secondary malignant neoplasm of bone: Secondary | ICD-10-CM | POA: Diagnosis not present

## 2022-10-18 DIAGNOSIS — Z510 Encounter for antineoplastic radiation therapy: Secondary | ICD-10-CM | POA: Diagnosis not present

## 2022-10-18 LAB — RAD ONC ARIA SESSION SUMMARY
Course Elapsed Days: 6
Plan Fractions Treated to Date: 3
Plan Prescribed Dose Per Fraction: 5 Gy
Plan Total Fractions Prescribed: 10
Plan Total Prescribed Dose: 50 Gy
Reference Point Dosage Given to Date: 15 Gy
Reference Point Session Dosage Given: 5 Gy
Session Number: 3

## 2022-10-19 ENCOUNTER — Ambulatory Visit: Payer: 59 | Admitting: Radiation Oncology

## 2022-10-20 ENCOUNTER — Ambulatory Visit
Admission: RE | Admit: 2022-10-20 | Discharge: 2022-10-20 | Disposition: A | Discharge status: Home / Self Care | Payer: 59 | Source: Ambulatory Visit | Attending: Radiation Oncology | Admitting: Radiation Oncology

## 2022-10-20 ENCOUNTER — Other Ambulatory Visit: Payer: Self-pay

## 2022-10-20 DIAGNOSIS — C7802 Secondary malignant neoplasm of left lung: Secondary | ICD-10-CM | POA: Diagnosis not present

## 2022-10-20 DIAGNOSIS — C541 Malignant neoplasm of endometrium: Secondary | ICD-10-CM | POA: Diagnosis not present

## 2022-10-20 DIAGNOSIS — C7951 Secondary malignant neoplasm of bone: Secondary | ICD-10-CM | POA: Diagnosis not present

## 2022-10-20 DIAGNOSIS — Z510 Encounter for antineoplastic radiation therapy: Secondary | ICD-10-CM | POA: Diagnosis not present

## 2022-10-20 DIAGNOSIS — C7801 Secondary malignant neoplasm of right lung: Secondary | ICD-10-CM | POA: Diagnosis not present

## 2022-10-20 LAB — RAD ONC ARIA SESSION SUMMARY
Course Elapsed Days: 8
Plan Fractions Treated to Date: 4
Plan Prescribed Dose Per Fraction: 5 Gy
Plan Total Fractions Prescribed: 10
Plan Total Prescribed Dose: 50 Gy
Reference Point Dosage Given to Date: 20 Gy
Reference Point Session Dosage Given: 5 Gy
Session Number: 4

## 2022-10-23 ENCOUNTER — Ambulatory Visit
Admission: RE | Admit: 2022-10-23 | Discharge: 2022-10-23 | Disposition: A | Discharge status: Home / Self Care | Payer: 59 | Source: Ambulatory Visit | Attending: Radiation Oncology | Admitting: Radiation Oncology

## 2022-10-23 ENCOUNTER — Other Ambulatory Visit: Payer: Self-pay

## 2022-10-23 DIAGNOSIS — Z510 Encounter for antineoplastic radiation therapy: Secondary | ICD-10-CM | POA: Diagnosis not present

## 2022-10-23 DIAGNOSIS — C541 Malignant neoplasm of endometrium: Secondary | ICD-10-CM | POA: Diagnosis not present

## 2022-10-23 DIAGNOSIS — C7951 Secondary malignant neoplasm of bone: Secondary | ICD-10-CM | POA: Diagnosis not present

## 2022-10-23 DIAGNOSIS — C7801 Secondary malignant neoplasm of right lung: Secondary | ICD-10-CM | POA: Diagnosis not present

## 2022-10-23 LAB — RAD ONC ARIA SESSION SUMMARY
Course Elapsed Days: 11
Plan Fractions Treated to Date: 5
Plan Prescribed Dose Per Fraction: 5 Gy
Plan Total Fractions Prescribed: 10
Plan Total Prescribed Dose: 50 Gy
Reference Point Dosage Given to Date: 25 Gy
Reference Point Session Dosage Given: 5 Gy
Session Number: 5

## 2022-10-24 ENCOUNTER — Telehealth: Payer: Self-pay | Admitting: Radiation Oncology

## 2022-10-24 ENCOUNTER — Ambulatory Visit: Payer: 59 | Admitting: Radiation Oncology

## 2022-10-24 ENCOUNTER — Ambulatory Visit: Payer: 59

## 2022-10-24 NOTE — Telephone Encounter (Signed)
Patient called to cancel the rest of her radiation treatment appointments due to feeling overwhelmed. Patient requested a call back to reschedule. Sent message to provider and treatment machine.

## 2022-10-25 ENCOUNTER — Ambulatory Visit: Payer: 59

## 2022-10-25 ENCOUNTER — Ambulatory Visit: Payer: 59 | Admitting: Radiation Oncology

## 2022-10-26 ENCOUNTER — Ambulatory Visit: Payer: 59 | Admitting: Radiation Oncology

## 2022-10-26 ENCOUNTER — Ambulatory Visit: Admission: RE | Admit: 2022-10-26 | Payer: 59 | Source: Ambulatory Visit | Admitting: Radiation Oncology

## 2022-10-26 ENCOUNTER — Telehealth: Payer: Self-pay

## 2022-10-26 NOTE — Telephone Encounter (Signed)
Call placed to ask patient when she would like to resume radiation treatment. No answer. Message left. Awaiting call back.

## 2022-10-27 ENCOUNTER — Ambulatory Visit: Payer: 59 | Admitting: Radiation Oncology

## 2022-10-30 ENCOUNTER — Ambulatory Visit: Payer: 59

## 2022-10-30 ENCOUNTER — Ambulatory Visit: Payer: 59 | Admitting: Radiation Oncology

## 2022-10-31 ENCOUNTER — Ambulatory Visit: Payer: 59 | Admitting: Radiation Oncology

## 2022-10-31 NOTE — Telephone Encounter (Signed)
Called and spoke with patients sister [name]. Per [name] patient is feeling better since stopping radiation treatment last week. Patient continues to have leg weakness. Per sister patient does not want to finish out her last few radiation treatments. Patient will continue to follow up with Dr. Myna Hidalgo.

## 2022-11-01 ENCOUNTER — Ambulatory Visit: Payer: 59 | Admitting: Radiation Oncology

## 2022-11-02 ENCOUNTER — Ambulatory Visit: Payer: 59 | Admitting: Radiation Oncology

## 2022-11-03 ENCOUNTER — Encounter: Payer: Self-pay | Admitting: Hematology & Oncology

## 2022-11-03 ENCOUNTER — Ambulatory Visit: Payer: 59 | Admitting: Radiation Oncology

## 2022-11-06 ENCOUNTER — Encounter: Payer: Self-pay | Admitting: Radiation Oncology

## 2022-11-07 ENCOUNTER — Encounter: Payer: Self-pay | Admitting: Radiation Oncology

## 2022-11-16 ENCOUNTER — Inpatient Hospital Stay: Payer: 59 | Attending: Hematology & Oncology

## 2022-11-16 ENCOUNTER — Inpatient Hospital Stay (HOSPITAL_BASED_OUTPATIENT_CLINIC_OR_DEPARTMENT_OTHER): Payer: 59 | Admitting: Hematology & Oncology

## 2022-11-16 ENCOUNTER — Inpatient Hospital Stay: Payer: 59

## 2022-11-16 ENCOUNTER — Telehealth: Payer: Self-pay | Admitting: *Deleted

## 2022-11-16 ENCOUNTER — Other Ambulatory Visit: Payer: Self-pay

## 2022-11-16 ENCOUNTER — Encounter: Payer: Self-pay | Admitting: Hematology & Oncology

## 2022-11-16 VITALS — BP 142/74 | HR 81 | Temp 98.5°F | Resp 18 | Ht 65.0 in | Wt 134.0 lb

## 2022-11-16 DIAGNOSIS — N189 Chronic kidney disease, unspecified: Secondary | ICD-10-CM

## 2022-11-16 DIAGNOSIS — D631 Anemia in chronic kidney disease: Secondary | ICD-10-CM

## 2022-11-16 DIAGNOSIS — D509 Iron deficiency anemia, unspecified: Secondary | ICD-10-CM | POA: Diagnosis not present

## 2022-11-16 DIAGNOSIS — D649 Anemia, unspecified: Secondary | ICD-10-CM | POA: Insufficient documentation

## 2022-11-16 DIAGNOSIS — R21 Rash and other nonspecific skin eruption: Secondary | ICD-10-CM | POA: Diagnosis not present

## 2022-11-16 DIAGNOSIS — C530 Malignant neoplasm of endocervix: Secondary | ICD-10-CM

## 2022-11-16 DIAGNOSIS — C541 Malignant neoplasm of endometrium: Secondary | ICD-10-CM

## 2022-11-16 DIAGNOSIS — Z95828 Presence of other vascular implants and grafts: Secondary | ICD-10-CM | POA: Diagnosis not present

## 2022-11-16 LAB — CBC WITH DIFFERENTIAL (CANCER CENTER ONLY)
Abs Immature Granulocytes: 0.02 10*3/uL (ref 0.00–0.07)
Basophils Absolute: 0 10*3/uL (ref 0.0–0.1)
Basophils Relative: 1 %
Eosinophils Absolute: 0.1 10*3/uL (ref 0.0–0.5)
Eosinophils Relative: 2 %
HCT: 15.6 % — ABNORMAL LOW (ref 36.0–46.0)
Hemoglobin: 5 g/dL — CL (ref 12.0–15.0)
Immature Granulocytes: 1 %
Lymphocytes Relative: 22 %
Lymphs Abs: 0.6 10*3/uL — ABNORMAL LOW (ref 0.7–4.0)
MCH: 26.2 pg (ref 26.0–34.0)
MCHC: 32.1 g/dL (ref 30.0–36.0)
MCV: 81.7 fL (ref 80.0–100.0)
Monocytes Absolute: 0.2 10*3/uL (ref 0.1–1.0)
Monocytes Relative: 7 %
Neutro Abs: 1.9 10*3/uL (ref 1.7–7.7)
Neutrophils Relative %: 67 %
Platelet Count: 171 10*3/uL (ref 150–400)
RBC: 1.91 MIL/uL — ABNORMAL LOW (ref 3.87–5.11)
RDW: 16.8 % — ABNORMAL HIGH (ref 11.5–15.5)
WBC Count: 2.9 10*3/uL — ABNORMAL LOW (ref 4.0–10.5)
nRBC: 0 % (ref 0.0–0.2)

## 2022-11-16 LAB — PREPARE RBC (CROSSMATCH)

## 2022-11-16 LAB — CMP (CANCER CENTER ONLY)
ALT: <5 U/L (ref 0–44)
AST: 7 U/L — ABNORMAL LOW (ref 15–41)
Albumin: 3.5 g/dL (ref 3.5–5.0)
Alkaline Phosphatase: 72 U/L (ref 38–126)
Anion gap: 18 — ABNORMAL HIGH (ref 5–15)
BUN: 42 mg/dL — ABNORMAL HIGH (ref 6–20)
CO2: 17 mmol/L — ABNORMAL LOW (ref 22–32)
Calcium: 8.1 mg/dL — ABNORMAL LOW (ref 8.9–10.3)
Chloride: 102 mmol/L (ref 98–111)
Creatinine: 2.98 mg/dL — ABNORMAL HIGH (ref 0.44–1.00)
GFR, Estimated: 18 mL/min — ABNORMAL LOW (ref >60)
Glucose, Bld: 179 mg/dL — ABNORMAL HIGH (ref 70–99)
Potassium: 3.2 mmol/L — ABNORMAL LOW (ref 3.5–5.1)
Sodium: 137 mmol/L (ref 135–145)
Total Bilirubin: 0.3 mg/dL (ref 0.3–1.2)
Total Protein: 6.8 g/dL (ref 6.5–8.1)

## 2022-11-16 LAB — SAVE SMEAR(SSMR), FOR PROVIDER SLIDE REVIEW

## 2022-11-16 LAB — SAMPLE TO BLOOD BANK

## 2022-11-16 NOTE — Telephone Encounter (Signed)
Jaime Gomez from lab brought a panic HGB to my attention. HGB is 5.0. results given to MD.

## 2022-11-16 NOTE — Patient Instructions (Signed)

## 2022-11-16 NOTE — Progress Notes (Signed)
Hematology and Oncology Follow Up Visit  Jaime Gomez [id number] 1965-11-19 57 y.o. 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 twice daily --increase to 5 mg p.o. twice daily on 03/03/2022 -- d/c on 03/13/2022 Arixtra 7.5 mg sq q day --  start on 03/13/2022     Interim History:  Jaime Gomez is back for follow-up.  She does feel quite tired.  I am not surprised given that her hemoglobin is down to 5.  This certainly is quite low for her.  We are going to have to give her a transfusion.  I am trying to avoid ESA because of her past history of thromboembolic disease.  I think another problem is the fact that her last CA-125 was up to 581.  Her last level was 377.  I have to believe that the Femara/Afinitor is losing its effectiveness.  I know that she has tried to avoid chemotherapy for as long as possible.  I think it has been several years since she has had any kind of chemotherapy.  She still has a nephrostomy tubes in.  Actually, her renal function seems to be doing little bit better.  I think some of this has to do with the fact that her blood sugars are also better.  Apparently, she has had periods of cognitive impairment.  She has had some confusion.  I am not sure exactly what could be the issue.  I would not surprise me if she may have TIAs.  I know she is on Arixtra.  I told her that if she added baby aspirin (81 mg p.o. daily) this may help a little bit.  I told her that she MUST take the aspirin with food.  She will cannot complete radiation therapy.  I think she only had 4 treatments.  I am unsure exactly what happened.  She is really not complain of any pain.  She is on oxycodone which seems to help with her pain.  She has had no fever.  She has had no cough.  She has had no  obvious change in bowel or bladder habits.  She did have a bout of diarrhea last week.  She had watery stool.  She is on the long-term Macrobid.  She said the diarrhea resolved in about 4 days.  She now has constipation.  I told her that she can try MiraLAX.  She has had no leg swelling.  There is been no obvious bleeding.  She has had no problems with the Arixtra.  Currently, I would have to say that her performance status is probably ECOG 1-2.     Medications:  Current Outpatient Medications:    acetaminophen (TYLENOL) 500 MG tablet, Take 500 mg by mouth every 6 (six) hours as needed for mild pain., Disp: , Rfl:    carvedilol (COREG) 12.5 MG tablet, Take 1 tablet (12.5 mg total) by mouth 2 (two) times daily with a meal., Disp: 30 tablet, Rfl: 5   everolimus (AFINITOR) 10 MG tablet, Take 1 tablet (10 mg total) by mouth daily at 6 (six) AM., Disp: 30 tablet, Rfl: 3   folic acid (FOLVITE) 1 MG tablet, Take 1 mg by mouth daily at 6 (six) AM., Disp: , Rfl:    fondaparinux (ARIXTRA) 7.5 MG/0.6ML SOLN injection, Inject 0.6 mLs (7.5 mg total) into the skin daily., Disp:  18 mL, Rfl: 6   gabapentin (NEURONTIN) 100 MG capsule, Take 1 capsule (100 mg total) by mouth 2 (two) times daily., Disp: 180 capsule, Rfl: 3   insulin aspart protamine - aspart (NOVOLOG MIX 70/30 FLEXPEN) (70-30) 100 UNIT/ML FlexPen, Inject 20 Units into the skin as directed. Per meal, Disp: , Rfl:    insulin detemir (LEVEMIR) 100 UNIT/ML injection, Inject 0.2 mLs (20 Units total) into the skin 2 (two) times daily., Disp: 10 mL, Rfl: 4   nitrofurantoin, macrocrystal-monohydrate, (MACROBID) 100 MG capsule, Take 1 capsule (100 mg total) by mouth 2 (two) times daily., Disp: 180 capsule, Rfl: 6   ondansetron (ZOFRAN) 8 MG tablet, Take 8 mg by mouth every 8 (eight) hours as needed., Disp: , Rfl:    oxybutynin (DITROPAN-XL) 10 MG 24 hr tablet, Take 10 mg by mouth daily., Disp: , Rfl:    oxycodone (OXY-IR) 5 MG capsule, Take 1 capsule (5 mg  total) by mouth every 4 (four) hours as needed for pain., Disp: 120 capsule, Rfl: 0   polyethylene glycol (MIRALAX / GLYCOLAX) 17 g packet, Take 17 g by mouth daily as needed for mild constipation., Disp: 14 each, Rfl: 0   prochlorperazine (COMPAZINE) 10 MG tablet, Take 10 mg by mouth every 6 (six) hours as needed for nausea or vomiting., Disp: , Rfl:    letrozole (FEMARA) 2.5 MG tablet, Take 2.5 mg by mouth daily at 6 (six) AM. (Patient not taking: Reported on 09/18/2022), Disp: , Rfl:    mirabegron ER (MYRBETRIQ) 25 MG TB24 tablet, Take 1 tablet (25 mg total) by mouth daily for 360 doses., Disp: 30 tablet, Rfl: 11  Allergies:  Allergies  Allergen Reactions   Taxotere [Docetaxel] Swelling    Throat swelling    Past Medical History, Surgical history, Social history, and Family History were reviewed and updated.  Review of Systems: Review of Systems  Constitutional:  Positive for fatigue.  HENT:  Negative.    Eyes: Negative.   Respiratory: Negative.    Cardiovascular:  Positive for leg swelling.  Gastrointestinal:  Positive for abdominal pain.  Genitourinary:  Positive for pelvic pain.   Musculoskeletal: Negative.   Skin: Negative.   Neurological: Negative.   Hematological: Negative.   Psychiatric/Behavioral: Negative.      Physical Exam:  height is  (1.651 m) and weight is 134 lb (60.8 kg). Her oral temperature is 98.5 F (36.9 C). Her blood pressure is 142/74 (abnormal) and her pulse is 81. Her respiration is 18 and oxygen saturation is 100%.   Wt Readings from Last 3 Encounters:  11/16/22 134 lb (60.8 kg)  10/11/22 136 lb (61.7 kg)  09/18/22 140 lb 3.2 oz (63.6 kg)    Physical Exam Vitals reviewed.  HENT:     Head: Normocephalic and atraumatic.  Eyes:     Pupils: Pupils are equal, round, and reactive to light.  Cardiovascular:     Rate and Rhythm: Normal rate and regular rhythm.     Heart sounds: Normal heart sounds.  Pulmonary:     Effort: Pulmonary effort is  normal.     Breath sounds: Normal breath sounds.  Abdominal:     General: Bowel sounds are normal.     Palpations: Abdomen is soft.  Musculoskeletal:        General: No tenderness or deformity. Normal range of motion.     Cervical back: Normal range of motion.  Lymphadenopathy:     Cervical: No cervical adenopathy.  Skin:  General: Skin is warm and dry.     Findings: No erythema or rash.  Neurological:     Mental Status: She is alert and oriented to person, place, and time.  Psychiatric:        Behavior: Behavior normal.        Thought Content: Thought content normal.        Judgment: Judgment normal.     Lab Results  Component Value Date   WBC 2.9 (L) 11/16/2022   HGB 5.0 (LL) 11/16/2022   HCT 15.6 (L) 11/16/2022   MCV 81.7 11/16/2022   PLT 171 11/16/2022     Chemistry      Component Value Date/Time   NA 134 (L) 10/11/2022 1329   K 3.1 (L) 10/11/2022 1329   CL 103 10/11/2022 1329   CO2 15 (L) 10/11/2022 1329   BUN 61 (H) 10/11/2022 1329   CREATININE 4.07 (H) 10/11/2022 1329      Component Value Date/Time   CALCIUM 9.2 10/11/2022 1329   ALKPHOS 81 10/11/2022 1329   AST 10 (L) 10/11/2022 1329   ALT 5 10/11/2022 1329   BILITOT 0.4 10/11/2022 1329      Impression and Plan: Ms. Wildrick is a very charming 57 year old white female.  She has metastatic endometrial cancer.  She is on Afinitor/letrozole.   At this point, I think we will get had to make a change in her treatment protocol.  I will have to look back to see what exactly she has been treated with with respect to chemotherapy in [facility].  We are going to have to transfuse her.  She will have 2 units of blood tomorrow.  I know that she is trying hard.  She has a lot of support from her family.  She really is highly motivated.  Hopefully, we can figure out how we can best treat her.  We will need to get another PET scan on her.  We actually made to think about a biopsy with possible molecular analysis  since has been quite a while since she had this done.  We will see what her CA-125 looks like.  I will plan to see her back after she has the PET scan and then we can figure out how we can try to best treat her.    [name], MD 4/25/20243:33 PM

## 2022-11-17 ENCOUNTER — Inpatient Hospital Stay: Payer: 59

## 2022-11-17 VITALS — BP 165/82 | HR 64 | Temp 97.7°F | Resp 18

## 2022-11-17 DIAGNOSIS — D631 Anemia in chronic kidney disease: Secondary | ICD-10-CM

## 2022-11-17 DIAGNOSIS — R21 Rash and other nonspecific skin eruption: Secondary | ICD-10-CM

## 2022-11-17 DIAGNOSIS — Z95828 Presence of other vascular implants and grafts: Secondary | ICD-10-CM

## 2022-11-17 DIAGNOSIS — C541 Malignant neoplasm of endometrium: Secondary | ICD-10-CM | POA: Diagnosis not present

## 2022-11-17 DIAGNOSIS — C530 Malignant neoplasm of endocervix: Secondary | ICD-10-CM

## 2022-11-17 DIAGNOSIS — G2581 Restless legs syndrome: Secondary | ICD-10-CM

## 2022-11-17 DIAGNOSIS — D509 Iron deficiency anemia, unspecified: Secondary | ICD-10-CM

## 2022-11-17 LAB — BPAM RBC
Blood Product Expiration Date: 202405032359
Blood Product Expiration Date: 202405222359

## 2022-11-17 LAB — TYPE AND SCREEN: Unit division: 0

## 2022-11-17 LAB — IRON AND IRON BINDING CAPACITY (CC-WL,HP ONLY)
Iron: 28 ug/dL (ref 28–170)
Saturation Ratios: 13 % (ref 10.4–31.8)
TIBC: 211 ug/dL — ABNORMAL LOW (ref 250–450)
UIBC: 183 ug/dL (ref 148–442)

## 2022-11-17 LAB — FERRITIN: Ferritin: 1812 ng/mL — ABNORMAL HIGH (ref 11–307)

## 2022-11-17 MED ORDER — SODIUM CHLORIDE 0.9% FLUSH
10.0000 mL | INTRAVENOUS | Status: AC | PRN
  Administered 2022-11-17: 10 mL

## 2022-11-17 MED ORDER — SODIUM CHLORIDE 0.9% IV SOLUTION
250.0000 mL | Freq: Once | INTRAVENOUS | Status: AC
  Administered 2022-11-17: 250 mL via INTRAVENOUS

## 2022-11-17 MED ORDER — DIPHENHYDRAMINE HCL 25 MG PO CAPS: 25.0000 mg | ORAL_CAPSULE | Freq: Once | ORAL | Status: DC

## 2022-11-17 MED ORDER — ACETAMINOPHEN 325 MG PO TABS: 650.0000 mg | ORAL_TABLET | Freq: Once | ORAL | Status: DC

## 2022-11-17 MED ORDER — HEPARIN SOD (PORK) LOCK FLUSH 100 UNIT/ML IV SOLN
250.0000 [IU] | INTRAVENOUS | Status: AC | PRN
  Administered 2022-11-17: 500 [IU]

## 2022-11-17 MED ORDER — FUROSEMIDE 10 MG/ML IJ SOLN
20.0000 mg | Freq: Once | INTRAMUSCULAR | Status: AC
  Administered 2022-11-17: 20 mg via INTRAVENOUS

## 2022-11-17 MED ORDER — LORAZEPAM 2 MG/ML IJ SOLN
0.2500 mg | Freq: Once | INTRAMUSCULAR | Status: AC
  Administered 2022-11-17: 0.25 mg via INTRAVENOUS
  Filled 2022-11-17: qty 1

## 2022-11-17 NOTE — Patient Instructions (Signed)

## 2022-11-18 LAB — CA 125: Cancer Antigen (CA) 125: 255 U/mL — ABNORMAL HIGH (ref 0.0–38.1)

## 2022-11-19 LAB — TYPE AND SCREEN
ABO/RH(D): A POS
Antibody Screen: POSITIVE
DAT, IgG: NEGATIVE
Donor AG Type: NEGATIVE
Donor AG Type: NEGATIVE
Unit division: 0

## 2022-11-19 LAB — BPAM RBC
ISSUE DATE / TIME: 202404260718
ISSUE DATE / TIME: 202404260718
Unit Type and Rh: 6200
Unit Type and Rh: 6200

## 2022-11-20 ENCOUNTER — Other Ambulatory Visit: Payer: Self-pay | Admitting: *Deleted

## 2022-11-20 ENCOUNTER — Encounter: Payer: Self-pay | Admitting: *Deleted

## 2022-11-20 ENCOUNTER — Other Ambulatory Visit: Payer: Self-pay | Admitting: Family

## 2022-11-20 ENCOUNTER — Telehealth: Payer: Self-pay | Admitting: *Deleted

## 2022-11-20 DIAGNOSIS — N189 Chronic kidney disease, unspecified: Secondary | ICD-10-CM

## 2022-11-20 NOTE — Progress Notes (Signed)
Changer venofer to Ferrlecit per Dr. Myna Hidalgo request.

## 2022-11-20 NOTE — Telephone Encounter (Addendum)
-----   Message from [name], MD sent at 11/17/2022  5:06 PM EDT ---- Called patient to let her know that the iron studies are on the borderline side of low.  Dr Myna Hidalgo would like to give her a dose of IV iron.  She needs 3 doses per Dr Myna Hidalgo.  Patient would also like to get a CBC to see what her Hgb is since she was so low last week.  Appt made for both on Wednesday.  Lab orders placed.  Patient also questioned previous MyChart message where Dr Myna Hidalgo wants her to restart the Afinitor.  Spoke with Dr Myna Hidalgo, would like for patient to restart since her tumor marker is down as long as she is tolerating it ok.  Patient aware and will try to see if she can take without nausea.  Patient aware of all instructions and appreciates call

## 2022-11-22 ENCOUNTER — Telehealth: Payer: Self-pay | Admitting: *Deleted

## 2022-11-22 ENCOUNTER — Inpatient Hospital Stay: Payer: 59 | Attending: Hematology & Oncology

## 2022-11-22 ENCOUNTER — Inpatient Hospital Stay: Payer: 59

## 2022-11-22 VITALS — BP 125/80 | HR 88 | Temp 97.8°F | Resp 18

## 2022-11-22 DIAGNOSIS — D509 Iron deficiency anemia, unspecified: Secondary | ICD-10-CM

## 2022-11-22 DIAGNOSIS — C541 Malignant neoplasm of endometrium: Secondary | ICD-10-CM | POA: Diagnosis not present

## 2022-11-22 DIAGNOSIS — N1831 Chronic kidney disease, stage 3a: Secondary | ICD-10-CM | POA: Diagnosis not present

## 2022-11-22 DIAGNOSIS — N189 Chronic kidney disease, unspecified: Secondary | ICD-10-CM

## 2022-11-22 LAB — CBC WITH DIFFERENTIAL (CANCER CENTER ONLY)
Abs Immature Granulocytes: 0.02 10*3/uL (ref 0.00–0.07)
Basophils Absolute: 0 10*3/uL (ref 0.0–0.1)
Basophils Relative: 1 %
Eosinophils Absolute: 0 10*3/uL (ref 0.0–0.5)
Eosinophils Relative: 2 %
HCT: 28.8 % — ABNORMAL LOW (ref 36.0–46.0)
Hemoglobin: 9.5 g/dL — ABNORMAL LOW (ref 12.0–15.0)
Immature Granulocytes: 1 %
Lymphocytes Relative: 19 %
Lymphs Abs: 0.5 10*3/uL — ABNORMAL LOW (ref 0.7–4.0)
MCH: 27.7 pg (ref 26.0–34.0)
MCHC: 33 g/dL (ref 30.0–36.0)
MCV: 84 fL (ref 80.0–100.0)
Monocytes Absolute: 0.2 10*3/uL (ref 0.1–1.0)
Monocytes Relative: 8 %
Neutro Abs: 1.8 10*3/uL (ref 1.7–7.7)
Neutrophils Relative %: 69 %
Platelet Count: 139 10*3/uL — ABNORMAL LOW (ref 150–400)
RBC: 3.43 MIL/uL — ABNORMAL LOW (ref 3.87–5.11)
RDW: 15.3 % (ref 11.5–15.5)
WBC Count: 2.6 10*3/uL — ABNORMAL LOW (ref 4.0–10.5)
nRBC: 0 % (ref 0.0–0.2)

## 2022-11-22 LAB — COMPREHENSIVE METABOLIC PANEL
ALT: <5 U/L (ref 0–44)
AST: 7 U/L — ABNORMAL LOW (ref 15–41)
Albumin: 3.4 g/dL — ABNORMAL LOW (ref 3.5–5.0)
Alkaline Phosphatase: 80 U/L (ref 38–126)
Anion gap: 7 (ref 5–15)
BUN: 51 mg/dL — ABNORMAL HIGH (ref 6–20)
CO2: 19 mmol/L — ABNORMAL LOW (ref 22–32)
Calcium: 8.3 mg/dL — ABNORMAL LOW (ref 8.9–10.3)
Chloride: 107 mmol/L (ref 98–111)
Creatinine, Ser: 2.95 mg/dL — ABNORMAL HIGH (ref 0.44–1.00)
GFR, Estimated: 18 mL/min — ABNORMAL LOW (ref >60)
Glucose, Bld: 397 mg/dL — ABNORMAL HIGH (ref 70–99)
Potassium: 3.7 mmol/L (ref 3.5–5.1)
Sodium: 133 mmol/L — ABNORMAL LOW (ref 135–145)
Total Bilirubin: 0.4 mg/dL (ref 0.3–1.2)
Total Protein: 7.1 g/dL (ref 6.5–8.1)

## 2022-11-22 LAB — SAMPLE TO BLOOD BANK

## 2022-11-22 MED ORDER — SODIUM CHLORIDE 0.9% FLUSH
10.0000 mL | Freq: Once | INTRAVENOUS | Status: AC
  Administered 2022-11-22: 10 mL via INTRAVENOUS

## 2022-11-22 MED ORDER — SODIUM CHLORIDE 0.9% FLUSH
10.0000 mL | Freq: Once | INTRAVENOUS | Status: AC | PRN
  Administered 2022-11-22: 10 mL

## 2022-11-22 MED ORDER — SODIUM CHLORIDE 0.9 % IV SOLN
125.0000 mg | Freq: Once | INTRAVENOUS | Status: AC
  Administered 2022-11-22: 125 mg via INTRAVENOUS
  Filled 2022-11-22: qty 10

## 2022-11-22 MED ORDER — SODIUM CHLORIDE 0.9 % IV SOLN: Freq: Once | INTRAVENOUS | Status: AC

## 2022-11-22 MED ORDER — HEPARIN SOD (PORK) LOCK FLUSH 100 UNIT/ML IV SOLN
500.0000 [IU] | Freq: Once | INTRAVENOUS | Status: AC
  Administered 2022-11-22: 500 [IU] via INTRAVENOUS

## 2022-11-22 NOTE — Patient Instructions (Signed)

## 2022-11-22 NOTE — Patient Instructions (Signed)
Sodium Ferric Gluconate Complex Injection What is this medication? SODIUM FERRIC GLUCONATE COMPLEX (SOE dee um FER ik GLOO koe nate KOM pleks) treats low levels of iron (iron deficiency anemia) in people with kidney disease. Iron is a mineral that plays an important role in making red blood cells, which carry oxygen from your lungs to the rest of your body. This medicine may be used for other purposes; ask your health care provider or pharmacist if you have questions. COMMON BRAND NAME(S): Ferrlecit, Nulecit What should I tell my care team before I take this medication? They need to know if you have any of the following conditions: Anemia that is not from iron deficiency High levels of iron in the blood An unusual or allergic reaction to iron, other medications, foods, dyes, or preservatives Pregnant or are trying to become pregnant Breast-feeding How should I use this medication? This medication is injected into a vein. It is given by your care team in a hospital or clinic setting. Talk to your care team about the use of this medication in children. While it may be prescribed for children as young as 6 years for selected conditions, precautions do apply. Overdosage: If you think you have taken too much of this medicine contact a poison control center or emergency room at once. NOTE: This medicine is only for you. Do not share this medicine with others. What if I miss a dose? It is important not to miss your dose. Call your care team if you are unable to keep an appointment. What may interact with this medication? Do not take this medication with any of the following: Deferasirox Deferoxamine Dimercaprol This medication may also interact with the following: Other iron products This list may not describe all possible interactions. Give your health care provider a list of all the medicines, herbs, non-prescription drugs, or dietary supplements you use. Also tell them if you smoke, drink  alcohol, or use illegal drugs. Some items may interact with your medicine. What should I watch for while using this medication? Your condition will be monitored carefully while you are receiving this medication. Visit your care team for regular checks on your progress. You may need blood work while you are taking this medication. What side effects may I notice from receiving this medication? Side effects that you should report to your care team as soon as possible: Allergic reactions--skin rash, itching, hives, swelling of the face, lips, tongue, or throat Low blood pressure--dizziness, feeling faint or lightheaded, blurry vision Shortness of breath Side effects that usually do not require medical attention (report to your care team if they continue or are bothersome): Flushing Headache Joint pain Muscle pain Nausea Pain, redness, or irritation at injection site This list may not describe all possible side effects. Call your doctor for medical advice about side effects. You may report side effects to FDA at 1-800-FDA-1088. Where should I keep my medication? This medication is given in a hospital or clinic and will not be stored at home. NOTE: This sheet is a summary. It may not cover all possible information. If you have questions about this medicine, talk to your doctor, pharmacist, or health care provider.  2023 Elsevier/Gold Standard (2020-12-03 00:00:00)  

## 2022-11-22 NOTE — Telephone Encounter (Signed)
-----   Message from [name], MD sent at 11/22/2022  3:40 PM EDT ----- Please call and let her know that the blood sugar is horrible.  It is 400.  She really is going into trouble with her diabetes if this does not get under better control.

## 2022-11-22 NOTE — Telephone Encounter (Signed)
Notified pt of labs results and discussed this can cause a lot of problems with her diabetes if its not under control. Pt verbalized understanding.

## 2022-11-24 DIAGNOSIS — N130 Hydronephrosis with ureteropelvic junction obstruction: Secondary | ICD-10-CM | POA: Diagnosis not present

## 2022-11-24 DIAGNOSIS — N821 Other female urinary-genital tract fistulae: Secondary | ICD-10-CM | POA: Diagnosis not present

## 2022-11-24 DIAGNOSIS — R3915 Urgency of urination: Secondary | ICD-10-CM | POA: Diagnosis not present

## 2022-11-28 ENCOUNTER — Encounter (HOSPITAL_BASED_OUTPATIENT_CLINIC_OR_DEPARTMENT_OTHER): Payer: Self-pay

## 2022-11-28 ENCOUNTER — Other Ambulatory Visit: Payer: Self-pay

## 2022-11-28 ENCOUNTER — Emergency Department (HOSPITAL_BASED_OUTPATIENT_CLINIC_OR_DEPARTMENT_OTHER): Payer: 59

## 2022-11-28 ENCOUNTER — Inpatient Hospital Stay (HOSPITAL_BASED_OUTPATIENT_CLINIC_OR_DEPARTMENT_OTHER)
Admission: EM | Admit: 2022-11-28 | Discharge: 2022-12-12 | DRG: 698 | Disposition: A | Discharge status: Home Health | Payer: 59 | Attending: Internal Medicine | Admitting: Internal Medicine

## 2022-11-28 DIAGNOSIS — Z466 Encounter for fitting and adjustment of urinary device: Secondary | ICD-10-CM | POA: Diagnosis not present

## 2022-11-28 DIAGNOSIS — N390 Urinary tract infection, site not specified: Secondary | ICD-10-CM | POA: Insufficient documentation

## 2022-11-28 DIAGNOSIS — I63423 Cerebral infarction due to embolism of bilateral anterior cerebral arteries: Secondary | ICD-10-CM | POA: Diagnosis not present

## 2022-11-28 DIAGNOSIS — E876 Hypokalemia: Secondary | ICD-10-CM | POA: Diagnosis present

## 2022-11-28 DIAGNOSIS — E1122 Type 2 diabetes mellitus with diabetic chronic kidney disease: Secondary | ICD-10-CM | POA: Diagnosis present

## 2022-11-28 DIAGNOSIS — T83512A Infection and inflammatory reaction due to nephrostomy catheter, initial encounter: Principal | ICD-10-CM | POA: Diagnosis present

## 2022-11-28 DIAGNOSIS — Z794 Long term (current) use of insulin: Secondary | ICD-10-CM

## 2022-11-28 DIAGNOSIS — N1831 Acute kidney failure, unspecified: Secondary | ICD-10-CM | POA: Diagnosis present

## 2022-11-28 DIAGNOSIS — A419 Sepsis, unspecified organism: Secondary | ICD-10-CM | POA: Diagnosis present

## 2022-11-28 DIAGNOSIS — Z5986 Financial insecurity: Secondary | ICD-10-CM

## 2022-11-28 DIAGNOSIS — Z95828 Presence of other vascular implants and grafts: Secondary | ICD-10-CM

## 2022-11-28 DIAGNOSIS — R4701 Aphasia: Secondary | ICD-10-CM | POA: Diagnosis not present

## 2022-11-28 DIAGNOSIS — A4152 Sepsis due to Pseudomonas: Secondary | ICD-10-CM | POA: Diagnosis not present

## 2022-11-28 DIAGNOSIS — I429 Cardiomyopathy, unspecified: Secondary | ICD-10-CM | POA: Diagnosis not present

## 2022-11-28 DIAGNOSIS — Z8049 Family history of malignant neoplasm of other genital organs: Secondary | ICD-10-CM

## 2022-11-28 DIAGNOSIS — I10 Essential (primary) hypertension: Secondary | ICD-10-CM | POA: Diagnosis not present

## 2022-11-28 DIAGNOSIS — G459 Transient cerebral ischemic attack, unspecified: Secondary | ICD-10-CM | POA: Diagnosis not present

## 2022-11-28 DIAGNOSIS — R0600 Dyspnea, unspecified: Secondary | ICD-10-CM | POA: Diagnosis not present

## 2022-11-28 DIAGNOSIS — E872 Acidosis, unspecified: Secondary | ICD-10-CM | POA: Diagnosis not present

## 2022-11-28 DIAGNOSIS — R509 Fever, unspecified: Secondary | ICD-10-CM | POA: Diagnosis not present

## 2022-11-28 DIAGNOSIS — Y833 Surgical operation with formation of external stoma as the cause of abnormal reaction of the patient, or of later complication, without mention of misadventure at the time of the procedure: Secondary | ICD-10-CM | POA: Diagnosis present

## 2022-11-28 DIAGNOSIS — F321 Major depressive disorder, single episode, moderate: Secondary | ICD-10-CM | POA: Diagnosis present

## 2022-11-28 DIAGNOSIS — Z79899 Other long term (current) drug therapy: Secondary | ICD-10-CM

## 2022-11-28 DIAGNOSIS — D631 Anemia in chronic kidney disease: Secondary | ICD-10-CM | POA: Diagnosis present

## 2022-11-28 DIAGNOSIS — N289 Disorder of kidney and ureter, unspecified: Secondary | ICD-10-CM | POA: Diagnosis not present

## 2022-11-28 DIAGNOSIS — I428 Other cardiomyopathies: Secondary | ICD-10-CM | POA: Diagnosis not present

## 2022-11-28 DIAGNOSIS — E1142 Type 2 diabetes mellitus with diabetic polyneuropathy: Secondary | ICD-10-CM | POA: Diagnosis present

## 2022-11-28 DIAGNOSIS — I639 Cerebral infarction, unspecified: Secondary | ICD-10-CM | POA: Diagnosis not present

## 2022-11-28 DIAGNOSIS — I5181 Takotsubo syndrome: Secondary | ICD-10-CM | POA: Diagnosis present

## 2022-11-28 DIAGNOSIS — N184 Chronic kidney disease, stage 4 (severe): Secondary | ICD-10-CM | POA: Diagnosis not present

## 2022-11-28 DIAGNOSIS — D709 Neutropenia, unspecified: Secondary | ICD-10-CM | POA: Diagnosis not present

## 2022-11-28 DIAGNOSIS — I82501 Chronic embolism and thrombosis of unspecified deep veins of right lower extremity: Secondary | ICD-10-CM | POA: Diagnosis present

## 2022-11-28 DIAGNOSIS — N19 Unspecified kidney failure: Secondary | ICD-10-CM | POA: Diagnosis not present

## 2022-11-28 DIAGNOSIS — Z888 Allergy status to other drugs, medicaments and biological substances status: Secondary | ICD-10-CM

## 2022-11-28 DIAGNOSIS — Z79811 Long term (current) use of aromatase inhibitors: Secondary | ICD-10-CM | POA: Diagnosis not present

## 2022-11-28 DIAGNOSIS — F419 Anxiety disorder, unspecified: Secondary | ICD-10-CM | POA: Diagnosis present

## 2022-11-28 DIAGNOSIS — Z86718 Personal history of other venous thrombosis and embolism: Secondary | ICD-10-CM

## 2022-11-28 DIAGNOSIS — G9341 Metabolic encephalopathy: Secondary | ICD-10-CM | POA: Diagnosis not present

## 2022-11-28 DIAGNOSIS — R410 Disorientation, unspecified: Secondary | ICD-10-CM | POA: Diagnosis not present

## 2022-11-28 DIAGNOSIS — G8929 Other chronic pain: Secondary | ICD-10-CM | POA: Diagnosis present

## 2022-11-28 DIAGNOSIS — R7881 Bacteremia: Secondary | ICD-10-CM | POA: Diagnosis not present

## 2022-11-28 DIAGNOSIS — Z8541 Personal history of malignant neoplasm of cervix uteri: Secondary | ICD-10-CM

## 2022-11-28 DIAGNOSIS — I5020 Unspecified systolic (congestive) heart failure: Secondary | ICD-10-CM | POA: Diagnosis not present

## 2022-11-28 DIAGNOSIS — R918 Other nonspecific abnormal finding of lung field: Secondary | ICD-10-CM | POA: Insufficient documentation

## 2022-11-28 DIAGNOSIS — E11649 Type 2 diabetes mellitus with hypoglycemia without coma: Secondary | ICD-10-CM | POA: Diagnosis not present

## 2022-11-28 DIAGNOSIS — G928 Other toxic encephalopathy: Secondary | ICD-10-CM | POA: Diagnosis not present

## 2022-11-28 DIAGNOSIS — Z7901 Long term (current) use of anticoagulants: Secondary | ICD-10-CM

## 2022-11-28 DIAGNOSIS — N136 Pyonephrosis: Secondary | ICD-10-CM | POA: Diagnosis present

## 2022-11-28 DIAGNOSIS — Z23 Encounter for immunization: Secondary | ICD-10-CM

## 2022-11-28 DIAGNOSIS — E119 Type 2 diabetes mellitus without complications: Secondary | ICD-10-CM | POA: Diagnosis not present

## 2022-11-28 DIAGNOSIS — T451X5A Adverse effect of antineoplastic and immunosuppressive drugs, initial encounter: Secondary | ICD-10-CM | POA: Diagnosis present

## 2022-11-28 DIAGNOSIS — I959 Hypotension, unspecified: Secondary | ICD-10-CM | POA: Diagnosis present

## 2022-11-28 DIAGNOSIS — I130 Hypertensive heart and chronic kidney disease with heart failure and stage 1 through stage 4 chronic kidney disease, or unspecified chronic kidney disease: Secondary | ICD-10-CM | POA: Diagnosis not present

## 2022-11-28 DIAGNOSIS — R6520 Severe sepsis without septic shock: Secondary | ICD-10-CM | POA: Diagnosis present

## 2022-11-28 DIAGNOSIS — N179 Acute kidney failure, unspecified: Secondary | ICD-10-CM | POA: Diagnosis not present

## 2022-11-28 DIAGNOSIS — Z823 Family history of stroke: Secondary | ICD-10-CM

## 2022-11-28 DIAGNOSIS — G9340 Encephalopathy, unspecified: Secondary | ICD-10-CM | POA: Diagnosis not present

## 2022-11-28 DIAGNOSIS — Z1152 Encounter for screening for COVID-19: Secondary | ICD-10-CM

## 2022-11-28 DIAGNOSIS — I5021 Acute systolic (congestive) heart failure: Secondary | ICD-10-CM | POA: Diagnosis not present

## 2022-11-28 DIAGNOSIS — R4182 Altered mental status, unspecified: Secondary | ICD-10-CM | POA: Diagnosis not present

## 2022-11-28 DIAGNOSIS — C55 Malignant neoplasm of uterus, part unspecified: Secondary | ICD-10-CM | POA: Diagnosis not present

## 2022-11-28 DIAGNOSIS — Z8673 Personal history of transient ischemic attack (TIA), and cerebral infarction without residual deficits: Secondary | ICD-10-CM

## 2022-11-28 DIAGNOSIS — R569 Unspecified convulsions: Secondary | ICD-10-CM | POA: Diagnosis not present

## 2022-11-28 DIAGNOSIS — R011 Cardiac murmur, unspecified: Secondary | ICD-10-CM | POA: Diagnosis present

## 2022-11-28 DIAGNOSIS — Z9221 Personal history of antineoplastic chemotherapy: Secondary | ICD-10-CM

## 2022-11-28 DIAGNOSIS — A4150 Gram-negative sepsis, unspecified: Secondary | ICD-10-CM | POA: Insufficient documentation

## 2022-11-28 DIAGNOSIS — C541 Malignant neoplasm of endometrium: Secondary | ICD-10-CM | POA: Diagnosis present

## 2022-11-28 DIAGNOSIS — I509 Heart failure, unspecified: Secondary | ICD-10-CM | POA: Diagnosis not present

## 2022-11-28 DIAGNOSIS — T80211A Bloodstream infection due to central venous catheter, initial encounter: Secondary | ICD-10-CM | POA: Diagnosis not present

## 2022-11-28 DIAGNOSIS — D509 Iron deficiency anemia, unspecified: Secondary | ICD-10-CM | POA: Diagnosis present

## 2022-11-28 DIAGNOSIS — I700 Atherosclerosis of aorta: Secondary | ICD-10-CM | POA: Diagnosis not present

## 2022-11-28 DIAGNOSIS — T361X5A Adverse effect of cephalosporins and other beta-lactam antibiotics, initial encounter: Secondary | ICD-10-CM | POA: Diagnosis not present

## 2022-11-28 LAB — URINALYSIS, W/ REFLEX TO CULTURE (INFECTION SUSPECTED)
Bilirubin Urine: NEGATIVE
Glucose, UA: NEGATIVE mg/dL
Ketones, ur: NEGATIVE mg/dL
Nitrite: NEGATIVE
Protein, ur: >=300 mg/dL — AB
Specific Gravity, Urine: 1.02 (ref 1.005–1.030)
WBC, UA: >50 WBC/hpf (ref 0–5)
pH: 5 (ref 5.0–8.0)

## 2022-11-28 LAB — URINALYSIS, ROUTINE W REFLEX MICROSCOPIC
Bilirubin Urine: NEGATIVE
Glucose, UA: NEGATIVE mg/dL
Ketones, ur: NEGATIVE mg/dL
Nitrite: NEGATIVE
Protein, ur: >=300 mg/dL — AB
Specific Gravity, Urine: >=1.03 (ref 1.005–1.030)
pH: 5 (ref 5.0–8.0)

## 2022-11-28 LAB — CBC WITH DIFFERENTIAL/PLATELET
Abs Immature Granulocytes: 0.19 10*3/uL — ABNORMAL HIGH (ref 0.00–0.07)
Basophils Absolute: 0 10*3/uL (ref 0.0–0.1)
Basophils Relative: 0 %
Eosinophils Absolute: 0 10*3/uL (ref 0.0–0.5)
Eosinophils Relative: 0 %
HCT: 22.1 % — ABNORMAL LOW (ref 36.0–46.0)
Hemoglobin: 7.3 g/dL — ABNORMAL LOW (ref 12.0–15.0)
Immature Granulocytes: 5 %
Lymphocytes Relative: 4 %
Lymphs Abs: 0.2 10*3/uL — ABNORMAL LOW (ref 0.7–4.0)
MCH: 26.8 pg (ref 26.0–34.0)
MCHC: 33 g/dL (ref 30.0–36.0)
MCV: 81.3 fL (ref 80.0–100.0)
Monocytes Absolute: 0.4 10*3/uL (ref 0.1–1.0)
Monocytes Relative: 9 %
Neutro Abs: 3.3 10*3/uL (ref 1.7–7.7)
Neutrophils Relative %: 82 %
Platelets: 120 10*3/uL — ABNORMAL LOW (ref 150–400)
RBC: 2.72 MIL/uL — ABNORMAL LOW (ref 3.87–5.11)
RDW: 15.1 % (ref 11.5–15.5)
WBC: 4 10*3/uL (ref 4.0–10.5)
nRBC: 0 % (ref 0.0–0.2)

## 2022-11-28 LAB — COMPREHENSIVE METABOLIC PANEL
ALT: 8 U/L (ref 0–44)
AST: 23 U/L (ref 15–41)
Albumin: 2.4 g/dL — ABNORMAL LOW (ref 3.5–5.0)
Alkaline Phosphatase: 100 U/L (ref 38–126)
Anion gap: 15 (ref 5–15)
BUN: 74 mg/dL — ABNORMAL HIGH (ref 6–20)
CO2: 13 mmol/L — ABNORMAL LOW (ref 22–32)
Calcium: 7.8 mg/dL — ABNORMAL LOW (ref 8.9–10.3)
Chloride: 99 mmol/L (ref 98–111)
Creatinine, Ser: 4.94 mg/dL — ABNORMAL HIGH (ref 0.44–1.00)
GFR, Estimated: 10 mL/min — ABNORMAL LOW (ref >60)
Glucose, Bld: 321 mg/dL — ABNORMAL HIGH (ref 70–99)
Potassium: 3.8 mmol/L (ref 3.5–5.1)
Sodium: 127 mmol/L — ABNORMAL LOW (ref 135–145)
Total Bilirubin: 0.4 mg/dL (ref 0.3–1.2)
Total Protein: 6.5 g/dL (ref 6.5–8.1)

## 2022-11-28 LAB — APTT: aPTT: 49 seconds — ABNORMAL HIGH (ref 24–36)

## 2022-11-28 LAB — RESP PANEL BY RT-PCR (RSV, FLU A&B, COVID)  RVPGX2
Influenza A by PCR: NEGATIVE
Influenza B by PCR: NEGATIVE
Resp Syncytial Virus by PCR: NEGATIVE
SARS Coronavirus 2 by RT PCR: NEGATIVE

## 2022-11-28 LAB — PROTIME-INR
INR: 1.2 (ref 0.8–1.2)
Prothrombin Time: 14.7 seconds (ref 11.4–15.2)

## 2022-11-28 LAB — URINALYSIS, MICROSCOPIC (REFLEX): WBC, UA: >50 WBC/hpf (ref 0–5)

## 2022-11-28 LAB — GLUCOSE, CAPILLARY
Glucose-Capillary: 136 mg/dL — ABNORMAL HIGH (ref 70–99)
Glucose-Capillary: 221 mg/dL — ABNORMAL HIGH (ref 70–99)
Glucose-Capillary: 271 mg/dL — ABNORMAL HIGH (ref 70–99)

## 2022-11-28 LAB — HEMOGLOBIN A1C
Hgb A1c MFr Bld: 8.3 % — ABNORMAL HIGH (ref 4.8–5.6)
Mean Plasma Glucose: 191.51 mg/dL

## 2022-11-28 LAB — LACTIC ACID, PLASMA: Lactic Acid, Venous: 1.3 mmol/L (ref 0.5–1.9)

## 2022-11-28 MED ORDER — SODIUM CHLORIDE 0.9 % IV SOLN
2.0000 g | Freq: Once | INTRAVENOUS | Status: AC
  Administered 2022-11-28: 2 g via INTRAVENOUS
  Filled 2022-11-28: qty 12.5

## 2022-11-28 MED ORDER — INSULIN ASPART 100 UNIT/ML IJ SOLN
0.0000 [IU] | Freq: Three times a day (TID) | INTRAMUSCULAR | Status: DC
  Administered 2022-11-28: 8 [IU] via SUBCUTANEOUS

## 2022-11-28 MED ORDER — SORBITOL 70 % SOLN: 30.0000 mL | Freq: Every day | Status: DC | PRN

## 2022-11-28 MED ORDER — SODIUM BICARBONATE 650 MG PO TABS
650.0000 mg | ORAL_TABLET | Freq: Three times a day (TID) | ORAL | Status: DC
  Administered 2022-11-28 – 2022-12-12 (×30): 650 mg via ORAL
  Filled 2022-11-28 (×32): qty 1

## 2022-11-28 MED ORDER — HYDROMORPHONE HCL 1 MG/ML IJ SOLN
0.5000 mg | INTRAMUSCULAR | Status: DC | PRN
  Administered 2022-12-02 – 2022-12-03 (×4): 0.5 mg via INTRAVENOUS
  Filled 2022-11-28 (×4): qty 1

## 2022-11-28 MED ORDER — ACETAMINOPHEN 325 MG PO TABS
650.0000 mg | ORAL_TABLET | Freq: Four times a day (QID) | ORAL | Status: DC | PRN
  Administered 2022-11-28 – 2022-11-29 (×2): 650 mg via ORAL
  Filled 2022-11-28 (×2): qty 2

## 2022-11-28 MED ORDER — OXYCODONE HCL 5 MG PO TABS
5.0000 mg | ORAL_TABLET | ORAL | Status: DC | PRN
  Administered 2022-11-28 – 2022-12-11 (×11): 5 mg via ORAL
  Filled 2022-11-28 (×11): qty 1

## 2022-11-28 MED ORDER — GABAPENTIN 100 MG PO CAPS
100.0000 mg | ORAL_CAPSULE | Freq: Two times a day (BID) | ORAL | Status: DC
  Administered 2022-11-28 – 2022-12-08 (×15): 100 mg via ORAL
  Filled 2022-11-28 (×17): qty 1

## 2022-11-28 MED ORDER — SENNOSIDES-DOCUSATE SODIUM 8.6-50 MG PO TABS: 1.0000 | ORAL_TABLET | Freq: Every evening | ORAL | Status: DC | PRN

## 2022-11-28 MED ORDER — CARVEDILOL 12.5 MG PO TABS
12.5000 mg | ORAL_TABLET | Freq: Two times a day (BID) | ORAL | Status: DC
  Administered 2022-11-29 – 2022-12-11 (×17): 12.5 mg via ORAL
  Filled 2022-11-28 (×20): qty 1

## 2022-11-28 MED ORDER — SODIUM CHLORIDE 0.9 % IV SOLN
2.0000 g | INTRAVENOUS | Status: DC
  Administered 2022-11-29 – 2022-12-03 (×5): 2 g via INTRAVENOUS
  Filled 2022-11-28 (×6): qty 12.5

## 2022-11-28 MED ORDER — ACETAMINOPHEN 500 MG PO TABS: 500.0000 mg | ORAL_TABLET | Freq: Four times a day (QID) | ORAL | Status: DC | PRN

## 2022-11-28 MED ORDER — LACTATED RINGERS IV BOLUS (SEPSIS)
250.0000 mL | Freq: Once | INTRAVENOUS | Status: AC
  Administered 2022-11-28: 250 mL via INTRAVENOUS

## 2022-11-28 MED ORDER — ONDANSETRON HCL 4 MG/2ML IJ SOLN: 4.0000 mg | Freq: Four times a day (QID) | INTRAMUSCULAR | Status: DC | PRN

## 2022-11-28 MED ORDER — LACTATED RINGERS IV SOLN
INTRAVENOUS | Status: AC
  Administered 2022-11-29: 150 mL/h via INTRAVENOUS

## 2022-11-28 MED ORDER — INSULIN ASPART 100 UNIT/ML IJ SOLN
0.0000 [IU] | INTRAMUSCULAR | Status: DC
  Administered 2022-11-28: 5 [IU] via SUBCUTANEOUS
  Administered 2022-11-28: 2 [IU] via SUBCUTANEOUS
  Administered 2022-11-29: 5 [IU] via SUBCUTANEOUS
  Administered 2022-11-29: 3 [IU] via SUBCUTANEOUS
  Administered 2022-11-30: 2 [IU] via SUBCUTANEOUS

## 2022-11-28 MED ORDER — FOLIC ACID 1 MG PO TABS
1.0000 mg | ORAL_TABLET | Freq: Every day | ORAL | Status: DC
  Administered 2022-11-29 – 2022-12-12 (×11): 1 mg via ORAL
  Filled 2022-11-28 (×14): qty 1

## 2022-11-28 MED ORDER — ONDANSETRON HCL 4 MG PO TABS
4.0000 mg | ORAL_TABLET | Freq: Four times a day (QID) | ORAL | Status: DC | PRN
  Administered 2022-12-10: 4 mg via ORAL
  Filled 2022-11-28: qty 1

## 2022-11-28 MED ORDER — MAGNESIUM CITRATE PO SOLN: 1.0000 | Freq: Once | ORAL | Status: DC | PRN

## 2022-11-28 MED ORDER — INSULIN DETEMIR 100 UNIT/ML ~~LOC~~ SOLN
10.0000 [IU] | Freq: Once | SUBCUTANEOUS | Status: AC
  Administered 2022-11-28: 10 [IU] via SUBCUTANEOUS
  Filled 2022-11-28: qty 0.1

## 2022-11-28 MED ORDER — ACETAMINOPHEN 650 MG RE SUPP: 650.0000 mg | Freq: Four times a day (QID) | RECTAL | Status: DC | PRN

## 2022-11-28 MED ORDER — ACETAMINOPHEN 500 MG PO TABS
1000.0000 mg | ORAL_TABLET | Freq: Once | ORAL | Status: DC
  Filled 2022-11-28: qty 2

## 2022-11-28 NOTE — Assessment & Plan Note (Signed)
The patient's blood pressures are controlled at home on Carvedilol 12.5 bid. This is held at this time due to low blood pressures.

## 2022-11-28 NOTE — Progress Notes (Signed)
Pharmacy Antibiotic Note  Staphanie Blitch is a 57 y.o. female admitted on 11/28/2022 with sepsis.  Pharmacy has been consulted for Cefepime dosing.  CrCl 10-20 mL/min  Plan: Cefepime 2g Q24H  Height: 5\' 5"  (165.1 cm) Weight: 60.8 kg (134 lb) IBW/kg (Calculated) : 57  Temp (24hrs), Avg:100.8 F (38.2 C), Min:100.8 F (38.2 C), Max:100.8 F (38.2 C)  Recent Labs  Lab 11/22/22 1153  WBC 2.6*  CREATININE 2.95*    Estimated Creatinine Clearance: 18.7 mL/min (A) (by C-G formula based on SCr of 2.95 mg/dL (H)).    Allergies  Allergen Reactions   Taxotere [Docetaxel] Swelling    Throat swelling    Thank you for allowing pharmacy to be a part of this patient's care.  Ellis Savage, PharmD Clinical Pharmacist 11/28/2022 8:44 AM

## 2022-11-28 NOTE — Progress Notes (Signed)
Plan of Care Note for accepted transfer   Patient: Jaime Gomez MRN: [id number]   DOA: 11/28/2022  Facility requesting transfer: [facility].  Requesting Provider: Gwyneth Sprout, MD. Reason for transfer: Sepsis due to UTI. Facility course:  Per Dr. Anitra Lauth:  "Chief Complaint  Patient presents with   Fever   Weakness    Jaime Gomez is a 57 y.o. female.   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  Patient has had a few episodes of emesis but nothing consistent.  She has had anorexia but denies any abdominal pain, chest pain or shortness of breath.  She has not had any diarrhea.  The pain she is experiencing is more myalgias but no focal pains.  She does have a left-sided nephrostomy tube which was due for replacement tomorrow.  She has been putting urine out of both the bag and peeing spontaneously.  She does complain of some frequency and urgency.  Temperatures have been between 101- 103 since Friday."  Lab work: Resp panel by RT-PCR (RSV, Flu A&B, Covid) Anterior Nasal Swab [[id number]]   Collected: 11/28/22 0844   Updated: 11/28/22 1045   Specimen Source: Anterior Nasal Swab    SARS Coronavirus 2 by RT PCR NEGATIVE   Influenza A by PCR NEGATIVE   Influenza B by PCR NEGATIVE   Resp Syncytial Virus by PCR NEGATIVE  Urinalysis, Microscopic (reflex) [[id number]] (Abnormal)   Collected: 11/28/22 0951   Updated: 11/28/22 1039    RBC / HPF 0-5 RBC/hpf   WBC, UA >50 WBC/hpf   Bacteria, UA MANY Abnormal    Squamous Epithelial / HPF 0-5 /HPF   Budding Yeast PRESENT  Urinalysis, Routine w reflex microscopic -Urine, Clean Catch  [[id number]] (Abnormal)   Collected: 11/28/22 0951   Updated: 11/28/22 1039   Specimen Source: Urine, Clean Catch    Color, Urine YELLOW   APPearance TURBID Abnormal    Specific Gravity, Urine >=1.030   pH 5.0   Glucose, UA NEGATIVE mg/dL   Hgb urine dipstick LARGE Abnormal    Bilirubin Urine NEGATIVE   Ketones, ur NEGATIVE mg/dL   Protein, ur >=725 Abnormal  mg/dL   Nitrite NEGATIVE   Leukocytes,Ua LARGE Abnormal   Urine Culture [[id number]]   Collected: 11/28/22 0844   Updated: 11/28/22 1037   Urinalysis, w/ Reflex to Culture (Infection Suspected) -Urine, Clean Catch [[id number]] (Abnormal)   Collected: 11/28/22 0844   Updated: 11/28/22 1036   Specimen Type: Urine    Specimen Source URINE, CLEAN CATCH   Color, Urine YELLOW   APPearance CLOUDY Abnormal    Specific Gravity, Urine 1.020   pH 5.0   Glucose, UA NEGATIVE mg/dL   Hgb urine dipstick LARGE Abnormal    Bilirubin Urine NEGATIVE   Ketones, ur NEGATIVE mg/dL   Protein, ur >=564 Abnormal  mg/dL   Nitrite NEGATIVE   Leukocytes,Ua LARGE Abnormal    Squamous Epithelial / HPF 0-5 /HPF   WBC, UA >50 WBC/hpf   RBC / HPF 0-5 RBC/hpf   Bacteria, UA MANY Abnormal    Budding Yeast PRESENT   Amorphous Crystal PRESENT  Comprehensive metabolic panel [[id number]] (Abnormal)   Collected: 11/28/22 1660   Updated: 11/28/22 6301  Specimen Type: Blood    Sodium 127 Low  mmol/L   Potassium 3.8 mmol/L   Chloride 99 mmol/L   CO2 13 Low  mmol/L   Glucose, Bld 321 High  mg/dL   BUN 74 High  mg/dL   Creatinine, Ser 0.98 High  mg/dL   Calcium 7.8 Low  mg/dL   Total Protein 6.5 g/dL   Albumin 2.4 Low  g/dL   AST 23 U/L   ALT 8 U/L   Alkaline Phosphatase 100 U/L   Total Bilirubin 0.4 mg/dL   GFR, Estimated 10 Low  mL/min   Anion gap 15  Blood Culture (routine x 2) [[id number]]   Collected: 11/28/22 0902   Updated: 11/28/22 0927   Specimen Type: Blood   Lactic acid, plasma [[id number]]   Collected: 11/28/22 0844   Updated:  11/28/22 0916   Specimen Type: Blood    Lactic Acid, Venous 1.3 mmol/L  Protime-INR [[id number]]   Collected: 11/28/22 0844   Updated: 11/28/22 0913   Specimen Type: Blood    Prothrombin Time 14.7 seconds   INR 1.2  APTT [[id number]] (Abnormal)   Collected: 11/28/22 0844   Updated: 11/28/22 0913   Specimen Type: Blood    aPTT 49 High  seconds  CBC with Differential [[id number]] (Abnormal)   Collected: 11/28/22 0844   Updated: 11/28/22 0903   Specimen Type: Blood    WBC 4.0 K/uL   RBC 2.72 Low  MIL/uL   Hemoglobin 7.3 Low  g/dL   HCT 44.0 Low  %   MCV 81.3 fL   MCH 26.8 pg   MCHC 33.0 g/dL   RDW 34.7 %   Platelets 120 Low  K/uL   nRBC 0.0 %   Neutrophils Relative % 82 %   Neutro Abs 3.3 K/uL   Lymphocytes Relative 4 %   Lymphs Abs 0.2 Low  K/uL   Monocytes Relative 9 %   Monocytes Absolute 0.4 K/uL   Eosinophils Relative 0 %   Eosinophils Absolute 0.0 K/uL   Basophils Relative 0 %   Basophils Absolute 0.0 K/uL   Immature Granulocytes 5 %   Abs Immature Granulocytes 0.19 High  K/uL  Blood Culture (routine x 2) [[id number]]   Collected: 11/28/22 0844   Updated: 11/28/22 0858   Specimen Type: Blood    Plan of care: The patient is accepted for admission to Progressive unit, at [facility].  Please make n.p.o. after midnight per IR request.  She is scheduled for nephrostomy tube placement tomorrow.  Author: Bobette Mo, MD 11/28/2022  Check [email or web address] for on-call coverage.  Nursing staff, Please call [facility] Admits & Consults System-Wide number on Amion as soon as patient's arrival, so appropriate admitting provider can evaluate the pt.

## 2022-11-28 NOTE — ED Triage Notes (Signed)
Started having fever Friday, along with some vomiting over the weekend. On chemo pills for uterine/ovarian cancer. C/o generalized weakness.

## 2022-11-28 NOTE — Sepsis Progress Note (Signed)
Code Sepsis protocol being monitored by eLink. 

## 2022-11-28 NOTE — Assessment & Plan Note (Signed)
Noted. The patient is considered to be in remission. She is on oral chemotherapy.

## 2022-11-28 NOTE — Assessment & Plan Note (Addendum)
As patient will be NPO after midnight, she will receive 10 units of levemir tonight. At home her glucoses are controlled on levemir 10 units SQ bid. She also receives novolog 70/30 20 units with each meal. The novolog is also held tonight and may be restarted tomorrow after she is eating again. Hemoglobin A1c is 8.3 today.

## 2022-11-28 NOTE — ED Provider Notes (Addendum)
[facility] Provider Note   CSN: [id number] Arrival date & time: 11/28/22  7829     History  Chief Complaint  Patient presents with   Fever   Weakness    Jaime Gomez is a 57 y.o. female.  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  Patient has had a few episodes of emesis but nothing consistent.  She has had anorexia but denies any abdominal pain, chest pain or shortness of breath.  She has not had any diarrhea.  The pain she is experiencing is more myalgias but no focal pains.  She does have a left-sided nephrostomy tube which was due for replacement tomorrow.  She has been putting urine out of both the bag and peeing spontaneously.  She does complain of some frequency and urgency.  Temperatures have been between 101- 103 since Friday.  The history is provided by the patient and a relative.  Fever Weakness Associated symptoms: fever        Home Medications Prior to Admission medications   Medication Sig Start Date End Date Taking? Authorizing Provider  acetaminophen (TYLENOL) 500 MG tablet Take 500 mg by mouth every 6 (six) hours as needed for mild pain.    [provider]  carvedilol (COREG) 12.5 MG tablet Take 1 tablet (12.5 mg total) by mouth 2 (two) times daily with a meal. 08/30/22   Ennever, Rose Phi, MD  folic acid (FOLVITE) 1 MG tablet Take 1 mg by mouth daily at 6 (six) AM. 10/11/20   [provider]  fondaparinux (ARIXTRA) 7.5 MG/0.6ML SOLN injection Inject 0.6 mLs (7.5 mg total) into the skin daily. 03/10/22   [name], MD  gabapentin (NEURONTIN) 100 MG  capsule Take 1 capsule (100 mg total) by mouth 2 (two) times daily. 08/30/22   [name], MD  insulin aspart protamine - aspart (NOVOLOG MIX 70/30 FLEXPEN) (70-30) 100 UNIT/ML FlexPen Inject 20 Units into the skin as directed. Per meal 01/19/21   [provider]  insulin detemir (LEVEMIR) 100 UNIT/ML injection Inject 0.2 mLs (20 Units total) into the skin 2 (two) times daily. 08/30/22   [name], MD  mirabegron ER (MYRBETRIQ) 25 MG TB24 tablet Take 1 tablet (25 mg total) by mouth daily for 360 doses. 09/02/21 08/28/22  Jannifer Hick, MD  nitrofurantoin, macrocrystal-monohydrate, (MACROBID) 100 MG capsule Take 1 capsule (100 mg total) by mouth 2 (two) times daily. 11/07/21   [name], MD  ondansetron (ZOFRAN) 8 MG tablet Take 8 mg by mouth every 8 (eight) hours as needed. 08/18/20   [provider]  oxybutynin (DITROPAN-XL) 10 MG 24 hr tablet Take 10 mg by mouth daily. 08/27/22   [provider]  oxycodone (OXY-IR) 5 MG capsule Take 1 capsule (5 mg total) by mouth every 4 (four) hours as needed for pain. 10/13/22   [name], MD  polyethylene glycol (MIRALAX / GLYCOLAX) 17 g packet Take 17 g by mouth daily as needed for mild constipation. 09/05/21   Pokhrel, Rebekah Chesterfield, MD  prochlorperazine (COMPAZINE) 10 MG tablet Take 10 mg by mouth every 6 (six) hours as needed for nausea or vomiting. 08/18/21  [provider]      Allergies    Taxotere [docetaxel]    Review of Systems   Review of Systems  Constitutional:  Positive for fever.  Neurological:  Positive for weakness.    Physical Exam Updated Vital Signs BP 107/66   Pulse 77   Temp 98.6 F (37 C)   Resp 17   Ht 5\' 5"  (1.651 m)   Wt 60.8 kg   SpO2 98%   BMI 22.30 kg/m  Physical Exam Vitals and nursing note reviewed.  Constitutional:      General: She is not in acute distress.    Appearance: She is well-developed. She is ill-appearing.  HENT:     Head: Normocephalic and atraumatic.      Mouth/Throat:     Mouth: Mucous membranes are dry.  Eyes:     Conjunctiva/sclera: Conjunctivae normal.     Pupils: Pupils are equal, round, and reactive to light.  Cardiovascular:     Rate and Rhythm: Regular rhythm. Tachycardia present.     Heart sounds: No murmur heard. Pulmonary:     Effort: Pulmonary effort is normal. No respiratory distress.     Breath sounds: Normal breath sounds. No wheezing or rales.  Abdominal:     General: There is no distension.     Palpations: Abdomen is soft.     Tenderness: There is no abdominal tenderness. There is no guarding or rebound.     Comments: Nephrostomy tube present in the left flank.  No erythema or drainage around the tube site.  Urine present in the bag  Musculoskeletal:        General: No tenderness. Normal range of motion.     Cervical back: Normal range of motion and neck supple.     Comments: Trace edema in bilateral ankles  Skin:    General: Skin is warm and dry.     Coloration: Skin is pale.     Findings: No erythema or rash.  Neurological:     Mental Status: She is alert and oriented to person, place, and time. Mental status is at baseline.     Sensory: No sensory deficit.     Motor: No weakness.  Psychiatric:        Mood and Affect: Mood normal.        Behavior: Behavior normal.     ED Results / Procedures / Treatments   Labs (all labs ordered are listed, but only abnormal results are displayed) Labs Reviewed  COMPREHENSIVE METABOLIC PANEL - Abnormal; Notable for the following components:      Result Value   Sodium 127 (*)    CO2 13 (*)    Glucose, Bld 321 (*)    BUN 74 (*)    Creatinine, Ser 4.94 (*)    Calcium 7.8 (*)    Albumin 2.4 (*)    GFR, Estimated 10 (*)    All other components within normal limits  CBC WITH DIFFERENTIAL/PLATELET - Abnormal; Notable for the following components:   RBC 2.72 (*)    Hemoglobin 7.3 (*)    HCT 22.1 (*)    Platelets 120 (*)    Lymphs Abs 0.2 (*)    Abs Immature  Granulocytes 0.19 (*)    All other components within normal limits  APTT - Abnormal; Notable for the following components:   aPTT 49 (*)    All other components within normal limits  URINALYSIS, W/ REFLEX TO CULTURE (INFECTION SUSPECTED) - Abnormal; Notable for the following  components:   APPearance CLOUDY (*)    Hgb urine dipstick LARGE (*)    Protein, ur >=300 (*)    Leukocytes,Ua LARGE (*)    Bacteria, UA MANY (*)    All other components within normal limits  URINALYSIS, ROUTINE W REFLEX MICROSCOPIC - Abnormal; Notable for the following components:   APPearance TURBID (*)    Hgb urine dipstick LARGE (*)    Protein, ur >=300 (*)    Leukocytes,Ua LARGE (*)    All other components within normal limits  URINALYSIS, MICROSCOPIC (REFLEX) - Abnormal; Notable for the following components:   Bacteria, UA MANY (*)    All other components within normal limits  RESP PANEL BY RT-PCR (RSV, FLU A&B, COVID)  [id number]  CULTURE, BLOOD (ROUTINE X 2)  CULTURE, BLOOD (ROUTINE X 2)  URINE CULTURE  LACTIC ACID, PLASMA  PROTIME-INR    EKG EKG Interpretation  Date/Time:  Tuesday Nov 28 2022 08:29:06 EDT Ventricular Rate:  117 PR Interval:  134 QRS Duration: 88 QT Interval:  341 QTC Calculation: 476 R Axis:   22 Text Interpretation: Sinus tachycardia Borderline low voltage, extremity leads Anteroseptal infarct, old new Abnormal T, consider ischemia, diffuse leads Confirmed by Gwyneth Sprout ([id number]) on 11/28/2022 2:29:43 PM  Radiology DG Chest Port 1 View  Result Date: 11/28/2022 CLINICAL DATA:  Questionable sepsis evaluate for abnormality. EXAM: PORTABLE CHEST 1 VIEW COMPARISON:  Chest radiograph 09/01/2021.  PET-CT 08/21/2022. FINDINGS: Right chest Port-A-Cath tip projects over the superior cavoatrial junction. Low lung volumes accentuate the pulmonary vasculature and cardiomediastinal silhouette. Unchanged patchy perihilar opacities, accounting for differences in modality. No new airspace  disease. No pleural effusion or pneumothorax. Visualized bones and upper abdomen are unremarkable. IMPRESSION: Unchanged patchy perihilar opacities, accounting for differences in modality. No new airspace disease. Electronically Signed   By: Orvan Falconer M.D.   On: 11/28/2022 09:01    Procedures Procedures    Medications Ordered in ED Medications  lactated ringers infusion ( Intravenous New Bag/Given 11/28/22 0848)  acetaminophen (TYLENOL) tablet 1,000 mg (0 mg Oral Hold 11/28/22 1022)  ceFEPIme (MAXIPIME) 2 g in sodium chloride 0.9 % 100 mL IVPB (has no administration in time range)  lactated ringers bolus 250 mL (0 mLs Intravenous Stopped 11/28/22 0954)  ceFEPIme (MAXIPIME) 2 g in sodium chloride 0.9 % 100 mL IVPB (0 g Intravenous Stopped 11/28/22 0981)    ED Course/ Medical Decision Making/ A&P                             Medical Decision Making Amount and/or Complexity of Data Reviewed Independent Historian: caregiver External Data Reviewed: notes. Labs: ordered. Decision-making details documented in ED Course. Radiology: ordered and independent interpretation performed. Decision-making details documented in ED Course. ECG/medicine tests: ordered and independent interpretation performed. Decision-making details documented in ED Course.  Risk OTC drugs. Prescription drug management. Decision regarding hospitalization.   Pt with multiple medical problems and comorbidities and presenting today with a complaint that caries a high risk for morbidity and mortality.  Here today with sepsis criteria.  Patient has had 5 days of fever and is febrile here to 100.8.  Patient is also tachycardic with blood pressure in the low 100s and upper 90s.  She is mentating normally at this time.  Concern for urine being the source given her nephrostomy tube is due for changing tomorrow and her dysuria.  She has no evidence of cellulitis has no focal abdominal  tenderness concerning for renal abscess and  denies any cough congestion or shortness of breath.  Sepsis order set was initiated.  Patient covered with cefepime.  Given fluid bolus.  I independently interpreted patient's EKG and labs and lactate is within normal limits, CBC with white count of 4 hemoglobin of 7.3 from 9.5 6 days ago, UA with findings concerning for UTI with greater than 50 white blood cells, many bacteria, large leukocytes, viral panel negative, CMP with sodium of 127, normal potassium, creatinine today 4.94 from 2.956 days ago. I have independently visualized and interpreted pt's images today. EKG with new t-wave inversion anterolaterally.  Chest x-ray with patchy opacities however unchanged from prior.  Radiology reports no new airspace disease.  2:30 PM Patient is supposed to have a nephrostomy exchange tomorrow at [facility].  Discussed with the patient recommendation of admission due to sepsis and UTI and new renal failure.  Initially patient is very resistant to staying.  She is here with family member.  However after long discussion patient did consent for admission.  CRITICAL CARE Performed by: Kyri Dai Total critical care time: 30 minutes Critical care time was exclusive of separately billable procedures and treating other patients. Critical care was necessary to treat or prevent imminent or life-threatening deterioration. Critical care was time spent personally by me on the following activities: development of treatment plan with patient and/or surrogate as well as nursing, discussions with consultants, evaluation of patient's response to treatment, examination of patient, obtaining history from patient or surrogate, ordering and performing treatments and interventions, ordering and review of laboratory studies, ordering and review of radiographic studies, pulse oximetry and re-evaluation of patient's condition.        Final Clinical Impression(s) / ED Diagnoses Final diagnoses:  Sepsis with acute renal  failure without septic shock, due to unspecified organism, unspecified acute renal failure type (HCC)  Urinary tract infection associated with nephrostomy catheter, initial encounter [facility])    Rx / DC Orders ED Discharge Orders     None         Gwyneth Sprout, MD 11/28/22 1110    Gwyneth Sprout, MD 11/28/22 1431

## 2022-11-28 NOTE — Assessment & Plan Note (Signed)
The patient has a left nephrostomy tube that will be replaced tomorrow. UA is positive for UTI. The patient has had fever, tachycardia, and hypotension. She has AKI on CKD with a creatinine of 4.92.

## 2022-11-28 NOTE — Assessment & Plan Note (Signed)
Due to AKI on CKD IIIa. Will add sodium bicarbonate. Monitor CO2, creatinine and electrolytes.

## 2022-11-28 NOTE — Assessment & Plan Note (Signed)
The patient's baseline creatinine appears to be between 2.6 and 3.0. She presents today with creatinine of 4.94. This is the likely cause of her metabolic acidosis with Co2 of 13. She will receive sodium bicarbonate for this. Plan is for replacement of nephrostomy tube tomorrow.

## 2022-11-28 NOTE — H&P (Signed)
History and Physical    Patient: Jaime Gomez:[id number] DOB: 05-04-1966 DOA: 11/28/2022 DOS: the patient was seen and examined on 11/28/2022 PCP: Staci Acosta, FNP  Patient coming from: Home  Chief Complaint:  Chief Complaint  Patient presents with   Fever   Weakness   HPI: Jaime Gomez is a 57 y.o. 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 The patient has had placement of a left sided nephrostomy tube which was due for replacement on 11/29/2022.   The patient has presented with 5 days of fevers, malaise, myalgias, flank pain and emesis. She has had urinary frequency and urgency. She has urinary output from the tube and the urethra chronically. She states that today she is also having bilateral leg pain. Her glucoses have been high, and she has had intermittent nausea and vomiting. She states that she has no appetite. She states that she has been drinking.  UA was positive for a UTI. She presented with hypotension and tachycardia. At home she has had fevers and chills, although she does not know the exact temperature. Urinary culture and blood cultures are pending. WBC is 4.0. Her Hgb is low at 7.3. Platelets are low at 120. Sodium is low at 127.She has a non-gapped metabolic acidosis. Her creatinine is 4.94. Her baseline creatinine is in the 2-3 range. She is hypo-albuminemic at 2.4.  No diarrhea or constipation. No coffee ground emesis, hematemesis, melena, or hematochezia. She denies sores, rashes or wounds. No headache, neurological changes, lateralizing limb weakness, visual changes or confusion.  She will be admitted to a telemetry bed.   She will receive IV Cefepime, cautious IV fluids, and pain control. She will also have antiemetics. IR has been contacted and  replacement of her nephrostomy tube tomorrow. Blood cultures and urine cultures are pending. She will be NPO after midnight.  Review of Systems: As mentioned in the history of present illness. All other systems reviewed and are negative. 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 failure 08/18/2021   Diabetes mellitus without complication (HCC)    Endometrial cancer, FIGO stage IVB (HCC) 08/18/2021   Goals of care, counseling/discussion 08/18/2021   History of kidney stones    Hypertension    Leg DVT (deep venous thromboembolism), chronic, right (HCC) 08/18/2021   Pneumonia    Past Surgical History:  Procedure Laterality Date   ABDOMINAL HYSTERECTOMY     CYSTOSCOPY W/ URETERAL STENT PLACEMENT Bilateral 09/01/2021   Procedure: CYSTOSCOPY WITH RETROGRADE PYELOGRAM/URETERAL STENT PLACEMENT;  Surgeon: Jannifer Hick, MD;  Location: [facility];  Service: Urology;  Laterality: Bilateral;   CYSTOSCOPY WITH STENT PLACEMENT Right 05/19/2022   Procedure: CYSTOSCOPY WITH STENT CHANGE;  Surgeon: Jannifer Hick, MD;  Location: [facility];  Service: Urology;  Laterality: Right;  ONLY NEEDS 30 MIN   HERNIA REPAIR     IR CONVERT LEFT NEPHROSTOMY TO NEPHROURETERAL CATH  11/02/2021   IR CV LINE INJECTION  08/23/2021   IR IMAGING GUIDED PORT INSERTION  11/14/2021   IR NEPHROSTOMY EXCHANGE LEFT  11/02/2021   IR NEPHROSTOMY EXCHANGE LEFT  01/20/2022   IR NEPHROSTOMY PLACEMENT LEFT  09/03/2021   IR REMOVAL TUN ACCESS W/ PORT W/O FL MOD SED  11/14/2021   Social History:  reports that she has never smoked. She has never  used smokeless tobacco. She reports that she does not currently use alcohol. She reports that she does not use drugs.  Allergies  Allergen Reactions   Taxotere [Docetaxel] Swelling    Throat swelling    Family History  Problem Relation Age of Onset   Cancer Maternal Grandmother     Prior to Admission medications   Medication Sig Start Date End Date Taking? Authorizing  Provider  acetaminophen (TYLENOL) 500 MG tablet Take 500 mg by mouth every 6 (six) hours as needed for mild pain.    [provider]  carvedilol (COREG) 12.5 MG tablet Take 1 tablet (12.5 mg total) by mouth 2 (two) times daily with a meal. 08/30/22   Ennever, Rose Phi, MD  folic acid (FOLVITE) 1 MG tablet Take 1 mg by mouth daily at 6 (six) AM. 10/11/20   [provider]  fondaparinux (ARIXTRA) 7.5 MG/0.6ML SOLN injection Inject 0.6 mLs (7.5 mg total) into the skin daily. 03/10/22   [name], MD  gabapentin (NEURONTIN) 100 MG capsule Take 1 capsule (100 mg total) by mouth 2 (two) times daily. 08/30/22   [name], MD  insulin aspart protamine - aspart (NOVOLOG MIX 70/30 FLEXPEN) (70-30) 100 UNIT/ML FlexPen Inject 20 Units into the skin as directed. Per meal 01/19/21   [provider]  insulin detemir (LEVEMIR) 100 UNIT/ML injection Inject 0.2 mLs (20 Units total) into the skin 2 (two) times daily. 08/30/22   [name], MD  mirabegron ER (MYRBETRIQ) 25 MG TB24 tablet Take 1 tablet (25 mg total) by mouth daily for 360 doses. 09/02/21 08/28/22  Jannifer Hick, MD  nitrofurantoin, macrocrystal-monohydrate, (MACROBID) 100 MG capsule Take 1 capsule (100 mg total) by mouth 2 (two) times daily. 11/07/21   [name], MD  ondansetron (ZOFRAN) 8 MG tablet Take 8 mg by mouth every 8 (eight) hours as needed. 08/18/20   [provider]  oxybutynin (DITROPAN-XL) 10 MG 24 hr tablet Take 10 mg by mouth daily. 08/27/22   [provider]  oxycodone (OXY-IR) 5 MG capsule Take 1 capsule (5 mg total) by mouth every 4 (four) hours as needed for pain. 10/13/22   [name], MD  polyethylene glycol (MIRALAX / GLYCOLAX) 17 g packet Take 17 g by mouth daily as needed for mild constipation. 09/05/21   Pokhrel, Rebekah [place], MD  prochlorperazine (COMPAZINE) 10 MG tablet Take 10 mg by mouth every 6 (six) hours as needed for nausea or vomiting. 08/18/21   [provider]     Physical Exam: Vitals:   11/28/22 1300 11/28/22 1330 11/28/22 1353 11/28/22 1452  BP: 98/66 107/66  122/79  Pulse: 81 77  95  Resp: 16 14 17 16   Temp:   98.6 F (37 C) 98 F (36.7 C)  TempSrc:    Oral  SpO2: 100% 98%  100%  Weight:      Height:       Exam:  Constitutional:  The patient is awake, alert, and oriented x 3. No acute distress. Eyes:  pupils and irises appear normal Normal lids and conjunctivae ENMT:  grossly normal hearing  Lips appear normal external ears, nose appear normal Oropharynx: mucosa, tongue,posterior pharynx appear normal Neck:  neck appears normal, no masses, normal ROM, supple no thyromegaly Respiratory:  No increased work of breathing. No wheezes, rales, or rhonchi No tactile fremitus Cardiovascular:  Regular rate and rhythm No murmurs, ectopy, or gallups. No lateral PMI. No thrills. Abdomen:  Abdomen is soft,  non-tender, non-distended No hernias, masses, or organomegaly Normoactive bowel sounds.  Musculoskeletal:  No cyanosis, clubbing, or edema Skin:  No rashes, lesions, ulcers palpation of skin: no induration or nodules Neurologic:  CN 2-12 intact Sensation all 4 extremities intact Psychiatric:  Mental status Mood, affect appropriate Orientation to person, place, time  judgment and insight appear intact  Data Reviewed:  CBC    Component Value Date/Time   WBC 4.0 11/28/2022 0844   RBC 2.72 (L) 11/28/2022 0844   HGB 7.3 (L) 11/28/2022 0844   HGB 9.5 (L) 11/22/2022 1153   HCT 22.1 (L) 11/28/2022 0844   PLT 120 (L) 11/28/2022 0844   PLT 139 (L) 11/22/2022 1153   MCV 81.3 11/28/2022 0844   MCH 26.8 11/28/2022 0844   MCHC 33.0 11/28/2022 0844   RDW 15.1 11/28/2022 0844   LYMPHSABS 0.2 (L) 11/28/2022 0844   MONOABS 0.4 11/28/2022 0844   EOSABS 0.0 11/28/2022 0844   BASOSABS 0.0 11/28/2022 0844      Latest Ref Rng & Units 11/28/2022    8:44 AM 11/22/2022   11:53 AM 11/16/2022    3:04 PM  BMP  Glucose 70 - 99  mg/dL 409  811  914   BUN 6 - 20 mg/dL 74  51  42   Creatinine 0.44 - 1.00 mg/dL 7.82  9.56  2.13   Sodium 135 - 145 mmol/L 127  133  137   Potassium 3.5 - 5.1 mmol/L 3.8  3.7  3.2   Chloride 98 - 111 mmol/L 99  107  102   CO2 22 - 32 mmol/L 13  19  17    Calcium 8.9 - 10.3 mg/dL 7.8  8.3  8.1      Assessment and Plan: Sepsis secondary to UTI [facility]) The patient has a left nephrostomy tube that will be replaced tomorrow. UA is positive for UTI. The patient has had fever, tachycardia, and hypotension. She has AKI on CKD with a creatinine of 4.92.   Leg DVT (deep venous thromboembolism), chronic, right (HCC) The patient is on arixtra chronically.  Type 2 diabetes mellitus without complications (HCC) As patient will be NPO after midnight, she will receive 10 units of levemir tonight. At home her glucoses are controlled on levemir 10 units SQ bid. She also receives novolog 70/30 20 units with each meal. The novolog is also held tonight and may be restarted tomorrow after she is eating again. Hemoglobin A1c is 8.3 today.  Acute renal failure superimposed on stage 3a chronic kidney disease (HCC) The patient's baseline creatinine appears to be between 2.6 and 3.0. She presents today with creatinine of 4.94. This is the likely cause of her metabolic acidosis with Co2 of 13. She will receive sodium bicarbonate for this. Plan is for replacement of nephrostomy tube tomorrow.   Endometrial cancer, FIGO stage IVB (HCC) Noted. The patient is considered to be in remission. She is on oral chemotherapy.  Abnormality of lung on CXR The patient has persistent bilateral perihilar patchy opacities on CXR. They have previously been present on CXR of 08/2021 and PET-CT of 08/21/2022. Monitor. No current symptoms of respiratory illness.  Essential (primary) hypertension The patient's blood pressures are controlled at home on Carvedilol 12.5 bid. This is held at this time due to low blood pressures.  Metabolic  acidosis with normal anion gap and failure of bicarbonate regeneration Due to AKI on CKD IIIa. Will add sodium bicarbonate. Monitor CO2, creatinine and electrolytes.    I have seen and  examined this patient myself. I have spent 42 minutes in her evaluation and care.   Advance Care Planning:   Code Status: Full Code   Consults: Interventional radiology  Family Communication: None available  Severity of Illness: The appropriate patient status for this patient is INPATIENT. Inpatient status is judged to be reasonable and necessary in order to provide the required intensity of service to ensure the patient's safety. The patient's presenting symptoms, physical exam findings, and initial radiographic and laboratory data in the context of their chronic comorbidities is felt to place them at high risk for further clinical deterioration. Furthermore, it is not anticipated that the patient will be medically stable for discharge from the hospital within 2 midnights of admission.   * I certify that at the point of admission it is my clinical judgment that the patient will require inpatient hospital care spanning beyond 2 midnights from the point of admission due to high intensity of service, high risk for further deterioration and high frequency of surveillance required.*  Author: Breonia Kirstein, DO 11/28/2022 7:23 PM  For on call review [email or web address].

## 2022-11-28 NOTE — Assessment & Plan Note (Signed)
The patient is on arixtra chronically.

## 2022-11-28 NOTE — ED Notes (Signed)
Urinalysis collected at 0954 is from [facility]

## 2022-11-28 NOTE — ED Notes (Signed)
[facility] on the floor, paperwork given, pt belongings bagged and sent home with family

## 2022-11-28 NOTE — ED Notes (Signed)
ED TO INPATIENT HANDOFF REPORT  S Name/Age/Gender Jaime Gomez 57 y.o. female Room/Bed: MH04/MH04  Code Status   Code Status: Prior  Home/SNF/Other Home Patient oriented to: self, place, time, and situation Is this baseline? Yes   Triage Complete: Triage complete  Chief Complaint Sepsis secondary to UTI (HCC) [A41.9, N39.0]  Triage Note Started having fever Friday, along with some vomiting over the weekend. On chemo pills for uterine/ovarian cancer. C/o generalized weakness.   Allergies Allergies  Allergen Reactions   Taxotere [Docetaxel] Swelling    Throat swelling    Level of Care/Admitting Diagnosis ED Disposition     ED Disposition  Admit   Condition  --   Comment  Hospital Area: [facility] [[id number]]  Level of Care: Progressive [102]  Admit to Progressive based on following criteria: MULTISYSTEM THREATS such as stable sepsis, metabolic/electrolyte imbalance with or without encephalopathy that is responding to early treatment.  May admit patient to [facility] or [facility] if equivalent level of care is available:: No  Interfacility transfer: Yes  Covid Evaluation: Asymptomatic - no recent exposure (last 10 days) testing not required  Diagnosis: Sepsis secondary to UTI [facility]) [[id number]]  Admitting Physician: Bobette Mo [[id number]]  Attending Physician: Bobette Mo [[id number]]  Certification:: I certify this patient will need inpatient services for at least 2 midnights  Estimated Length of Stay: 2          B Medical/Surgery History 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 failure 08/18/2021   Diabetes mellitus without complication (HCC)    Endometrial cancer, FIGO stage IVB (HCC) 08/18/2021   Goals of care, counseling/discussion 08/18/2021   History of kidney stones    Hypertension    Leg DVT (deep venous thromboembolism), chronic, right (HCC) 08/18/2021   Pneumonia    Past Surgical History:   Procedure Laterality Date   ABDOMINAL HYSTERECTOMY     CYSTOSCOPY W/ URETERAL STENT PLACEMENT Bilateral 09/01/2021   Procedure: CYSTOSCOPY WITH RETROGRADE PYELOGRAM/URETERAL STENT PLACEMENT;  Surgeon: Jannifer Hick, MD;  Location: [facility];  Service: Urology;  Laterality: Bilateral;   CYSTOSCOPY WITH STENT PLACEMENT Right 05/19/2022   Procedure: CYSTOSCOPY WITH STENT CHANGE;  Surgeon: Jannifer Hick, MD;  Location: [facility];  Service: Urology;  Laterality: Right;  ONLY NEEDS 30 MIN   HERNIA REPAIR     IR CONVERT LEFT NEPHROSTOMY TO NEPHROURETERAL CATH  11/02/2021   IR CV LINE INJECTION  08/23/2021   IR IMAGING GUIDED PORT INSERTION  11/14/2021   IR NEPHROSTOMY EXCHANGE LEFT  11/02/2021   IR NEPHROSTOMY EXCHANGE LEFT  01/20/2022   IR NEPHROSTOMY PLACEMENT LEFT  09/03/2021   IR REMOVAL TUN ACCESS W/ PORT W/O FL MOD SED  11/14/2021     A IV Location/Drains/Wounds Patient Lines/Drains/Airways Status     Active Line/Drains/Airways     Name Placement date Placement time Site Days   Implanted Port 11/14/21 Right Chest 11/14/21  1016  Chest  379   Peripheral IV 11/28/22 20 G 1" Anterior;Right Forearm 11/28/22  0845  Forearm  less than 1   Nephrostomy Left 10.2 Fr. 01/20/22  1512  Left  312   Ureteral Drain/Stent Right ureter 6 Fr. 05/19/22  1340  Right ureter  193            Intake/Output Last 24 hours  Intake/Output Summary (Last 24 hours) at 11/28/2022 1247 Last data filed at 11/28/2022 0954 Gross per 24 hour  Intake 100.83 ml  Output --  Net 100.83 ml    Labs/Imaging Results for orders placed or performed during the hospital encounter of 11/28/22 (from the past 48 hour(s))  Resp panel by RT-PCR (RSV, Flu A&B, Covid) Anterior Nasal Swab     Status: None   Collection Time: 11/28/22  8:44 AM   Specimen: Anterior Nasal Swab  Result Value Ref Range   SARS Coronavirus 2 by RT PCR NEGATIVE NEGATIVE    Comment: (NOTE) SARS-CoV-2 target nucleic acids are NOT DETECTED.  The SARS-CoV-2 RNA  is generally detectable in upper respiratory specimens during the acute phase of infection. The lowest concentration of SARS-CoV-2 viral copies this assay can detect is 138 copies/mL. A negative result does not preclude SARS-Cov-2 infection and should not be used as the sole basis for treatment or other patient management decisions. A negative result may occur with  improper specimen collection/handling, submission of specimen other than nasopharyngeal swab, presence of viral mutation(s) within the areas targeted by this assay, and inadequate number of viral copies(<138 copies/mL). A negative result must be combined with clinical observations, patient history, and epidemiological information. The expected result is Negative.  Fact Sheet for Patients:  BloggerCourse.com  Fact Sheet for Healthcare Providers:  SeriousBroker.it  This test is no t yet approved or cleared by the [place] FDA and  has been authorized for detection and/or diagnosis of SARS-CoV-2 by FDA under an Emergency Use Authorization (EUA). This EUA will remain  in effect (meaning this test can be used) for the duration of the COVID-19 declaration under Section 564(b)(1) of the Act, 21 U.S.C.section 360bbb-3(b)(1), unless the authorization is terminated  or revoked sooner.       Influenza A by PCR NEGATIVE NEGATIVE   Influenza B by PCR NEGATIVE NEGATIVE    Comment: (NOTE) The Xpert Xpress SARS-CoV-2/FLU/RSV plus assay is intended as an aid in the diagnosis of influenza from Nasopharyngeal swab specimens and should not be used as a sole basis for treatment. Nasal washings and aspirates are unacceptable for Xpert Xpress SARS-CoV-2/FLU/RSV testing.  Fact Sheet for Patients: BloggerCourse.com  Fact Sheet for Healthcare Providers: SeriousBroker.it  This test is not yet approved or cleared by the [place] FDA  and has been authorized for detection and/or diagnosis of SARS-CoV-2 by FDA under an Emergency Use Authorization (EUA). This EUA will remain in effect (meaning this test can be used) for the duration of the COVID-19 declaration under Section 564(b)(1) of the Act, 21 U.S.C. section 360bbb-3(b)(1), unless the authorization is terminated or revoked.     Resp Syncytial Virus by PCR NEGATIVE NEGATIVE    Comment: (NOTE) Fact Sheet for Patients: BloggerCourse.com  Fact Sheet for Healthcare Providers: SeriousBroker.it  This test is not yet approved or cleared by the [place] FDA and has been authorized for detection and/or diagnosis of SARS-CoV-2 by FDA under an Emergency Use Authorization (EUA). This EUA will remain in effect (meaning this test can be used) for the duration of the COVID-19 declaration under Section 564(b)(1) of the Act, 21 U.S.C. section 360bbb-3(b)(1), unless the authorization is terminated or revoked.  Performed at [facility], [address] [place], [place] [address]   Lactic acid, plasma     Status: None   Collection Time: 11/28/22  8:44 AM  Result Value Ref Range   Lactic Acid, Venous 1.3 0.5 - 1.9 mmol/L    Comment: Performed at [facility], [address] [place], [place] [address]  Comprehensive metabolic panel  Status: Abnormal   Collection Time: 11/28/22  8:44 AM  Result Value Ref Range   Sodium 127 (L) 135 - 145 mmol/L   Potassium 3.8 3.5 - 5.1 mmol/L   Chloride 99 98 - 111 mmol/L   CO2 13 (L) 22 - 32 mmol/L   Glucose, Bld 321 (H) 70 - 99 mg/dL    Comment: Glucose reference range applies only to samples taken after fasting for at least 8 hours.   BUN 74 (H) 6 - 20 mg/dL   Creatinine, Ser 1.61 (H) 0.44 - 1.00 mg/dL   Calcium 7.8 (L) 8.9 - 10.3 mg/dL   Total Protein 6.5 6.5 - 8.1 g/dL   Albumin 2.4 (L) 3.5 - 5.0 g/dL   AST 23 15 - 41 U/L   ALT 8 0 - 44 U/L   Alkaline  Phosphatase 100 38 - 126 U/L   Total Bilirubin 0.4 0.3 - 1.2 mg/dL   GFR, Estimated 10 (L) >60 mL/min    Comment: (NOTE) Calculated using the CKD-EPI Creatinine Equation (2021)    Anion gap 15 5 - 15    Comment: Performed at [facility], [address] [place], [place] [address]  CBC with Differential     Status: Abnormal   Collection Time: 11/28/22  8:44 AM  Result Value Ref Range   WBC 4.0 4.0 - 10.5 K/uL   RBC 2.72 (L) 3.87 - 5.11 MIL/uL   Hemoglobin 7.3 (L) 12.0 - 15.0 g/dL   HCT 54.0 (L) 98.1 - 19.1 %   MCV 81.3 80.0 - 100.0 fL   MCH 26.8 26.0 - 34.0 pg   MCHC 33.0 30.0 - 36.0 g/dL   RDW 47.8 29.5 - 62.1 %   Platelets 120 (L) 150 - 400 K/uL   nRBC 0.0 0.0 - 0.2 %   Neutrophils Relative % 82 %   Neutro Abs 3.3 1.7 - 7.7 K/uL   Lymphocytes Relative 4 %   Lymphs Abs 0.2 (L) 0.7 - 4.0 K/uL   Monocytes Relative 9 %   Monocytes Absolute 0.4 0.1 - 1.0 K/uL   Eosinophils Relative 0 %   Eosinophils Absolute 0.0 0.0 - 0.5 K/uL   Basophils Relative 0 %   Basophils Absolute 0.0 0.0 - 0.1 K/uL   Immature Granulocytes 5 %   Abs Immature Granulocytes 0.19 (H) 0.00 - 0.07 K/uL    Comment: Performed at [facility], [address] [place], [place] [address]  Protime-INR     Status: None   Collection Time: 11/28/22  8:44 AM  Result Value Ref Range   Prothrombin Time 14.7 11.4 - 15.2 seconds   INR 1.2 0.8 - 1.2    Comment: (NOTE) INR goal varies based on device and disease states. Performed at [facility], [address] [place], [place] [address]   APTT     Status: Abnormal   Collection Time: 11/28/22  8:44 AM  Result Value Ref Range   aPTT 49 (H) 24 - 36 seconds    Comment:        IF BASELINE aPTT IS ELEVATED, SUGGEST PATIENT RISK ASSESSMENT BE USED TO DETERMINE APPROPRIATE ANTICOAGULANT THERAPY. Performed at [facility], [address] [place], [place] [address]   Urinalysis, w/ Reflex to Culture (Infection Suspected)  -Urine, Clean Catch     Status: Abnormal   Collection Time: 11/28/22  8:44 AM  Result Value Ref Range   Specimen Source URINE, CLEAN  CATCH    Color, Urine YELLOW YELLOW   APPearance CLOUDY (A) CLEAR   Specific Gravity, Urine 1.020 1.005 - 1.030   pH 5.0 5.0 - 8.0   Glucose, UA NEGATIVE NEGATIVE mg/dL   Hgb urine dipstick LARGE (A) NEGATIVE   Bilirubin Urine NEGATIVE NEGATIVE   Ketones, ur NEGATIVE NEGATIVE mg/dL   Protein, ur >=191 (A) NEGATIVE mg/dL   Nitrite NEGATIVE NEGATIVE   Leukocytes,Ua LARGE (A) NEGATIVE   Squamous Epithelial / HPF 0-5 0 - 5 /HPF   WBC, UA >50 0 - 5 WBC/hpf    Comment: Reflex urine culture not performed if WBC <=10, OR if Squamous epithelial cells >5. If Squamous epithelial cells >5, suggest recollection.   RBC / HPF 0-5 0 - 5 RBC/hpf   Bacteria, UA MANY (A) NONE SEEN   Budding Yeast PRESENT    Amorphous Crystal PRESENT     Comment: Performed at [facility], [address] [place], [place] [address]  Urinalysis, Routine w reflex microscopic -Urine, Clean Catch     Status: Abnormal   Collection Time: 11/28/22  9:51 AM  Result Value Ref Range   Color, Urine YELLOW YELLOW   APPearance TURBID (A) CLEAR   Specific Gravity, Urine >=1.030 1.005 - 1.030   pH 5.0 5.0 - 8.0   Glucose, UA NEGATIVE NEGATIVE mg/dL   Hgb urine dipstick LARGE (A) NEGATIVE   Bilirubin Urine NEGATIVE NEGATIVE   Ketones, ur NEGATIVE NEGATIVE mg/dL   Protein, ur >=562 (A) NEGATIVE mg/dL   Nitrite NEGATIVE NEGATIVE   Leukocytes,Ua LARGE (A) NEGATIVE    Comment: Performed at [facility], [address] [place], [place] [address]  Urinalysis, Microscopic (reflex)     Status: Abnormal   Collection Time: 11/28/22  9:51 AM  Result Value Ref Range   RBC / HPF 0-5 0 - 5 RBC/hpf   WBC, UA >50 0 - 5 WBC/hpf   Bacteria, UA MANY (A) NONE SEEN   Squamous Epithelial / HPF 0-5 0 - 5 /HPF   Budding Yeast PRESENT     Comment: Performed at [facility], [address] [place], [place] [address]   DG Chest Port 1 View  Result Date: 11/28/2022 CLINICAL DATA:  Questionable sepsis evaluate for abnormality. EXAM: PORTABLE CHEST 1 VIEW COMPARISON:  Chest radiograph 09/01/2021.  PET-CT 08/21/2022. FINDINGS: Right chest Port-A-Cath tip projects over the superior cavoatrial junction. Low lung volumes accentuate the pulmonary vasculature and cardiomediastinal silhouette. Unchanged patchy perihilar opacities, accounting for differences in modality. No new airspace disease. No pleural effusion or pneumothorax. Visualized bones and upper abdomen are unremarkable. IMPRESSION: Unchanged patchy perihilar opacities, accounting for differences in modality. No new airspace disease. Electronically Signed   By: Orvan Falconer M.D.   On: 11/28/2022 09:01    Pending Labs Unresulted Labs (From admission, onward)     Start     Ordered   11/28/22 0844  Urine Culture  Once,   R        11/28/22 0844   11/28/22 0830  Blood Culture (routine x 2)  (Septic presentation on arrival (screening labs, nursing and treatment orders for obvious sepsis))  BLOOD CULTURE X 2,   STAT      11/28/22 0831            Vitals/Pain Today's Vitals   11/28/22 1022 11/28/22 1030 11/28/22 1100 11/28/22 1200  BP:  106/69 106/71 108/75  Pulse: 100 (!) 101 94 91  Resp: 16 17 13 17   Temp: 98.6 F (37 C)   98.6 F (37 C)  TempSrc: Oral     SpO2: 96% 99% 98% 100%  Weight:      Height:        Isolation Precautions No active isolations  Medications Medications  lactated ringers infusion ( Intravenous New Bag/Given 11/28/22 0848)  acetaminophen (TYLENOL) tablet 1,000 mg (0 mg Oral Hold 11/28/22 1022)  ceFEPIme (MAXIPIME) 2 g in sodium chloride 0.9 % 100 mL IVPB (has no administration in time range)  lactated ringers bolus 250 mL (0 mLs Intravenous Stopped 11/28/22 0954)  ceFEPIme (MAXIPIME) 2 g in sodium chloride 0.9 % 100 mL IVPB (0 g Intravenous Stopped 11/28/22 1610)    Mobility walks  with device     R Recommendations: See Admitting Provider Note  Report given to:   Additional Notes:   Patient has personal wheelchair

## 2022-11-28 NOTE — ED Notes (Signed)
Report given to [facility]. 

## 2022-11-28 NOTE — ED Notes (Signed)
Patient transported to restroom using personal wheelchair with stand by assist.

## 2022-11-28 NOTE — ED Notes (Signed)
Report rec'd from prev RN 

## 2022-11-28 NOTE — ED Notes (Signed)
Urinalysis collected at 0844 is from nephrostomy bag

## 2022-11-28 NOTE — ED Notes (Signed)
Patient took 2 tylenol at approx 6am this am. EDP made aware.

## 2022-11-28 NOTE — Assessment & Plan Note (Signed)
The patient has persistent bilateral perihilar patchy opacities on CXR. They have previously been present on CXR of 08/2021 and PET-CT of 08/21/2022. Monitor. No current symptoms of respiratory illness.

## 2022-11-29 ENCOUNTER — Encounter (HOSPITAL_COMMUNITY): Payer: Self-pay

## 2022-11-29 ENCOUNTER — Inpatient Hospital Stay (HOSPITAL_COMMUNITY): Admission: RE | Admit: 2022-11-29 | Payer: 59 | Source: Ambulatory Visit

## 2022-11-29 ENCOUNTER — Other Ambulatory Visit: Payer: Self-pay | Admitting: Radiology

## 2022-11-29 ENCOUNTER — Inpatient Hospital Stay (HOSPITAL_COMMUNITY): Payer: 59

## 2022-11-29 DIAGNOSIS — N390 Urinary tract infection, site not specified: Secondary | ICD-10-CM | POA: Diagnosis not present

## 2022-11-29 DIAGNOSIS — A419 Sepsis, unspecified organism: Secondary | ICD-10-CM | POA: Diagnosis not present

## 2022-11-29 HISTORY — PX: IR NEPHROSTOMY EXCHANGE LEFT: IMG6069

## 2022-11-29 LAB — BLOOD CULTURE ID PANEL (REFLEXED) - BCID2

## 2022-11-29 LAB — CBC
HCT: 22.5 % — ABNORMAL LOW (ref 36.0–46.0)
Hemoglobin: 7.1 g/dL — ABNORMAL LOW (ref 12.0–15.0)
MCH: 27 pg (ref 26.0–34.0)
MCHC: 31.6 g/dL (ref 30.0–36.0)
MCV: 85.6 fL (ref 80.0–100.0)
Platelets: 122 10*3/uL — ABNORMAL LOW (ref 150–400)
RBC: 2.63 MIL/uL — ABNORMAL LOW (ref 3.87–5.11)
RDW: 15.3 % (ref 11.5–15.5)
WBC: 3.4 10*3/uL — ABNORMAL LOW (ref 4.0–10.5)
nRBC: 0 % (ref 0.0–0.2)

## 2022-11-29 LAB — COMPREHENSIVE METABOLIC PANEL
ALT: 11 U/L (ref 0–44)
AST: 76 U/L — ABNORMAL HIGH (ref 15–41)
Albumin: 2.3 g/dL — ABNORMAL LOW (ref 3.5–5.0)
Alkaline Phosphatase: 82 U/L (ref 38–126)
Anion gap: 12 (ref 5–15)
BUN: 80 mg/dL — ABNORMAL HIGH (ref 6–20)
CO2: 17 mmol/L — ABNORMAL LOW (ref 22–32)
Calcium: 8 mg/dL — ABNORMAL LOW (ref 8.9–10.3)
Chloride: 101 mmol/L (ref 98–111)
Creatinine, Ser: 5.15 mg/dL — ABNORMAL HIGH (ref 0.44–1.00)
GFR, Estimated: 9 mL/min — ABNORMAL LOW (ref >60)
Glucose, Bld: 84 mg/dL (ref 70–99)
Potassium: 3.4 mmol/L — ABNORMAL LOW (ref 3.5–5.1)
Sodium: 130 mmol/L — ABNORMAL LOW (ref 135–145)
Total Bilirubin: 0.5 mg/dL (ref 0.3–1.2)
Total Protein: 6.3 g/dL — ABNORMAL LOW (ref 6.5–8.1)

## 2022-11-29 LAB — URINE CULTURE: Culture: >=100000 — AB

## 2022-11-29 LAB — CULTURE, BLOOD (ROUTINE X 2)

## 2022-11-29 LAB — GLUCOSE, CAPILLARY
Glucose-Capillary: 135 mg/dL — ABNORMAL HIGH (ref 70–99)
Glucose-Capillary: 161 mg/dL — ABNORMAL HIGH (ref 70–99)
Glucose-Capillary: 236 mg/dL — ABNORMAL HIGH (ref 70–99)
Glucose-Capillary: 43 mg/dL — CL (ref 70–99)
Glucose-Capillary: 66 mg/dL — ABNORMAL LOW (ref 70–99)
Glucose-Capillary: 76 mg/dL (ref 70–99)

## 2022-11-29 LAB — TYPE AND SCREEN: Unit division: 0

## 2022-11-29 LAB — PREPARE RBC (CROSSMATCH)

## 2022-11-29 LAB — ABO/RH: ABO/RH(D): A POS

## 2022-11-29 LAB — BPAM RBC
Blood Product Expiration Date: 202406022359
Unit Type and Rh: 6200

## 2022-11-29 LAB — HIV ANTIBODY (ROUTINE TESTING W REFLEX): HIV Screen 4th Generation wRfx: NONREACTIVE

## 2022-11-29 MED ORDER — IPRATROPIUM-ALBUTEROL 0.5-2.5 (3) MG/3ML IN SOLN: 3.0000 mL | RESPIRATORY_TRACT | Status: DC | PRN

## 2022-11-29 MED ORDER — SODIUM CHLORIDE 0.9% IV SOLUTION: Freq: Once | INTRAVENOUS | Status: AC

## 2022-11-29 MED ORDER — CHLORHEXIDINE GLUCONATE CLOTH 2 % EX PADS: 6.0000 | MEDICATED_PAD | Freq: Every day | CUTANEOUS | Status: DC

## 2022-11-29 MED ORDER — DEXTROSE 50 % IV SOLN: 1.0000 | Freq: Once | INTRAVENOUS | Status: AC

## 2022-11-29 MED ORDER — POLYETHYLENE GLYCOL 3350 17 G PO PACK: 17.0000 g | PACK | Freq: Every day | ORAL | Status: DC | PRN

## 2022-11-29 MED ORDER — PNEUMOCOCCAL 20-VAL CONJ VACC 0.5 ML IM SUSY
0.5000 mL | PREFILLED_SYRINGE | INTRAMUSCULAR | Status: AC
  Administered 2022-12-01: 0.5 mL via INTRAMUSCULAR
  Filled 2022-11-29: qty 0.5

## 2022-11-29 MED ORDER — SODIUM CHLORIDE 0.9 % IV SOLN: INTRAVENOUS | Status: DC

## 2022-11-29 MED ORDER — TRAZODONE HCL 50 MG PO TABS
50.0000 mg | ORAL_TABLET | Freq: Every evening | ORAL | Status: DC | PRN
  Administered 2022-12-01: 50 mg via ORAL
  Filled 2022-11-29: qty 1

## 2022-11-29 MED ORDER — DEXTROSE-NACL 5-0.45 % IV SOLN: INTRAVENOUS | Status: DC

## 2022-11-29 MED ORDER — CHLORHEXIDINE GLUCONATE CLOTH 2 % EX PADS
6.0000 | MEDICATED_PAD | Freq: Every day | CUTANEOUS | Status: DC
  Administered 2022-11-30 – 2022-12-04 (×4): 6 via TOPICAL

## 2022-11-29 MED ORDER — HYDRALAZINE HCL 20 MG/ML IJ SOLN: 10.0000 mg | INTRAMUSCULAR | Status: DC | PRN

## 2022-11-29 MED ORDER — GUAIFENESIN 100 MG/5ML PO LIQD: 5.0000 mL | ORAL | Status: DC | PRN

## 2022-11-29 MED ORDER — POTASSIUM CHLORIDE CRYS ER 20 MEQ PO TBCR
40.0000 meq | EXTENDED_RELEASE_TABLET | Freq: Once | ORAL | Status: AC
  Administered 2022-11-29: 40 meq via ORAL
  Filled 2022-11-29: qty 2

## 2022-11-29 MED ORDER — IOHEXOL 300 MG/ML  SOLN
50.0000 mL | Freq: Once | INTRAMUSCULAR | Status: AC | PRN
  Administered 2022-11-29: 5 mL

## 2022-11-29 MED ORDER — FONDAPARINUX SODIUM 7.5 MG/0.6ML ~~LOC~~ SOLN: 7.5000 mg | SUBCUTANEOUS | Status: DC

## 2022-11-29 MED ORDER — METOPROLOL TARTRATE 5 MG/5ML IV SOLN: 5.0000 mg | INTRAVENOUS | Status: DC | PRN

## 2022-11-29 MED ORDER — INSULIN DETEMIR 100 UNIT/ML ~~LOC~~ SOLN
20.0000 [IU] | Freq: Every day | SUBCUTANEOUS | Status: DC
  Administered 2022-11-29 – 2022-12-01 (×3): 20 [IU] via SUBCUTANEOUS
  Filled 2022-11-29 (×4): qty 0.2

## 2022-11-29 MED ORDER — DEXTROSE 50 % IV SOLN
INTRAVENOUS | Status: AC
  Administered 2022-11-29: 50 mL via INTRAVENOUS
  Filled 2022-11-29: qty 50

## 2022-11-29 MED ORDER — POTASSIUM CHLORIDE 10 MEQ/100ML IV SOLN
10.0000 meq | INTRAVENOUS | Status: DC
  Filled 2022-11-29: qty 100

## 2022-11-29 MED ORDER — LIDOCAINE HCL 1 % IJ SOLN
INTRAMUSCULAR | Status: AC
  Filled 2022-11-29: qty 20

## 2022-11-29 NOTE — Progress Notes (Signed)
PROGRESS NOTE    Jaime Gomez  ZOX:[id number] DOB: [birth year] DOA: 11/28/2022 PCP: Staci Acosta, FNP   Brief Narrative:  57 y.o. 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 The patient has had placement of a left sided nephrostomy tube which was due for replacement on 11/29/2022.  Patient admitted for sepsis secondary to urinary tract infection.  IR consulted for nephrostomy tube change.   Assessment & Plan:  Principal Problem:   Sepsis secondary to UTI [facility]) Active Problems:   Endometrial cancer, FIGO stage IVB (HCC)   Leg DVT (deep venous thromboembolism), chronic, right (HCC)   Acute renal failure superimposed on stage 3a chronic kidney disease (HCC)   Essential (primary) hypertension   Type 2 diabetes mellitus without complications (HCC)   Abnormality of lung on CXR   Metabolic acidosis with normal anion gap and failure of bicarbonate regeneration     Assessment and Plan: * Sepsis secondary to UTI (HCC) Sepsis physiology stable for now.  Plans for nephrostomy tube change by IR today.  Follow culture data, continue IV cefepime.  Gentle hydration   Acute renal failure superimposed on stage 3a chronic kidney disease (HCC) Baseline creatinine 2.6, admission creatinine 4.9 > 5.15  Metabolic acidosis with normal anion gap and failure of bicarbonate regeneration Secondary to AKI, on sodium bicarb  Abnormality of lung on CXR The patient has persistent bilateral perihilar patchy opacities on CXR. They have previously been present on CXR of 08/2021 and PET-CT of 08/21/2022. Monitor. No current symptoms of respiratory illness.  Anemia of Chronic Dz Baseline Hb 9.5, today 7.1. Will order 2U PRBC Receives outpatient periodic transfusions.   Type 2 diabetes mellitus without  complications (HCC) Peripheral neuropathy -Sliding scale and Accu-Chek. Slowly resume home regimen Gabapentin  Essential (primary) hypertension On Coreg, can be held for soft blood pressure IV as needed   Leg DVT (deep venous thromboembolism), chronic, right (HCC) The patient is on arixtra chronically. Resume Arixtra but due to renal fx we will have to use different AC. Dr Myna Hidalgo consulted.   Endometrial cancer, FIGO stage IVB (HCC) On outpatient chemotherapy    DVT prophylaxis: Onc consulted for long term AC Code Status: Full Family Communication:   Status is: Inpatient Ongoing evaluation for urinary tract infection, anemia and acute kidney injury       Diet Orders (From admission, onward)     Start     Ordered   11/29/22 0001  Diet NPO time specified  Diet effective midnight        11/28/22 1923            Subjective: Underwent IR nephrostomy tube exchange this morning.  Tolerated this procedure well. Hemoglobin trended down, no obvious signs of blood loss.  Patient does get routine outpatient periodic transfusions.  She is agreeable for PRBC transfusion.   Examination:  General exam: Appears calm and comfortable  Respiratory system: Clear to auscultation. Respiratory effort normal. Cardiovascular system: S1 & S2 heard, RRR. No JVD, murmurs, rubs, gallops or clicks. No pedal edema. Gastrointestinal system: Abdomen is nondistended, soft and nontender. No organomegaly or masses felt. Normal bowel sounds heard. Central nervous system: Alert and oriented. No focal neurological deficits. Extremities: Symmetric 5 x 5 power. Skin: No rashes, lesions or ulcers Psychiatry: Judgement and insight appear normal. Mood & affect appropriate.  Objective: Vitals:   11/28/22 1452 11/28/22 1929 11/28/22 2226 11/29/22 0300  BP: 122/79 102/71 (!) 89/60 95/62  Pulse: 95 95 73 72  Resp: 16 16 16 15   Temp: 98 F (36.7 C) 99.5 F (37.5 C) 98.3 F (36.8 C) 97.8 F (36.6  C)  TempSrc: Oral Oral Oral Oral  SpO2: 100% 97% 100% 100%  Weight:      Height:        Intake/Output Summary (Last 24 hours) at 11/29/2022 0738 Last data filed at 11/29/2022 0500 Gross per 24 hour  Intake 2520.22 ml  Output 250 ml  Net 2270.22 ml   Filed Weights   11/28/22 0819  Weight: 60.8 kg    Scheduled Meds:  acetaminophen  1,000 mg Oral Once   carvedilol  12.5 mg Oral BID WC   folic acid  1 mg Oral Q0600   gabapentin  100 mg Oral BID   insulin aspart  0-15 Units Subcutaneous Q4H   [START ON 11/30/2022] pneumococcal 20-valent conjugate vaccine  0.5 mL Intramuscular Tomorrow-1000   sodium bicarbonate  650 mg Oral TID   Continuous Infusions:  ceFEPime (MAXIPIME) IV      Nutritional status     Body mass index is 22.3 kg/m.  Data Reviewed:   CBC: Recent Labs  Lab 11/22/22 1153 11/28/22 0844 11/29/22 0403  WBC 2.6* 4.0 3.4*  NEUTROABS 1.8 3.3  --   HGB 9.5* 7.3* 7.1*  HCT 28.8* 22.1* 22.5*  MCV 84.0 81.3 85.6  PLT 139* 120* 122*   Basic Metabolic Panel: Recent Labs  Lab 11/22/22 1153 11/28/22 0844 11/29/22 0403  NA 133* 127* 130*  K 3.7 3.8 3.4*  CL 107 99 101  CO2 19* 13* 17*  GLUCOSE 397* 321* 84  BUN 51* 74* 80*  CREATININE 2.95* 4.94* 5.15*  CALCIUM 8.3* 7.8* 8.0*   GFR: Estimated Creatinine Clearance: 10.7 mL/min (A) (by C-G formula based on SCr of 5.15 mg/dL (H)). Liver Function Tests: Recent Labs  Lab 11/22/22 1153 11/28/22 0844 11/29/22 0403  AST 7* 23 76*  ALT 5 8 11   ALKPHOS 80 100 82  BILITOT 0.4 0.4 0.5  PROT 7.1 6.5 6.3*  ALBUMIN 3.4* 2.4* 2.3*   No results for input(s): "LIPASE", "AMYLASE" in the last 168 hours. No results for input(s): "AMMONIA" in the last 168 hours. Coagulation Profile: Recent Labs  Lab 11/28/22 0844  INR 1.2   Cardiac Enzymes: No results for input(s): "CKTOTAL", "CKMB", "CKMBINDEX", "TROPONINI" in the last 168 hours. BNP (last 3 results) No results for input(s): "PROBNP" in the last 8760  hours. HbA1C: Recent Labs    11/28/22 1610  HGBA1C 8.3*   CBG: Recent Labs  Lab 11/28/22 1706 11/28/22 2023 11/28/22 2340 11/29/22 0352  GLUCAP 271* 221* 136* 76   Lipid Profile: No results for input(s): "CHOL", "HDL", "LDLCALC", "TRIG", "CHOLHDL", "LDLDIRECT" in the last 72 hours. Thyroid Function Tests: No results for input(s): "TSH", "T4TOTAL", "FREET4", "T3FREE", "THYROIDAB" in the last 72 hours. Anemia Panel: No results for input(s): "VITAMINB12", "FOLATE", "FERRITIN", "TIBC", "IRON", "RETICCTPCT" in the last 72 hours. Sepsis Labs: Recent Labs  Lab 11/28/22 0844  LATICACIDVEN 1.3    Recent Results (from the past 240 hour(s))  Resp panel by RT-PCR (RSV, Flu A&B, Covid) Anterior Nasal Swab     Status: None   Collection Time: 11/28/22  8:44 AM   Specimen: Anterior Nasal Swab  Result Value Ref Range Status   SARS Coronavirus 2 by RT PCR NEGATIVE NEGATIVE Final  Comment: (NOTE) SARS-CoV-2 target nucleic acids are NOT DETECTED.  The SARS-CoV-2 RNA is generally detectable in upper respiratory specimens during the acute phase of infection. The lowest concentration of SARS-CoV-2 viral copies this assay can detect is 138 copies/mL. A negative result does not preclude SARS-Cov-2 infection and should not be used as the sole basis for treatment or other patient management decisions. A negative result may occur with  improper specimen collection/handling, submission of specimen other than nasopharyngeal swab, presence of viral mutation(s) within the areas targeted by this assay, and inadequate number of viral copies(<138 copies/mL). A negative result must be combined with clinical observations, patient history, and epidemiological information. The expected result is Negative.  Fact Sheet for Patients:  BloggerCourse.com  Fact Sheet for Healthcare Providers:  SeriousBroker.it  This test is no t yet approved or cleared  by the [place] FDA and  has been authorized for detection and/or diagnosis of SARS-CoV-2 by FDA under an Emergency Use Authorization (EUA). This EUA will remain  in effect (meaning this test can be used) for the duration of the COVID-19 declaration under Section 564(b)(1) of the Act, 21 U.S.C.section 360bbb-3(b)(1), unless the authorization is terminated  or revoked sooner.       Influenza A by PCR NEGATIVE NEGATIVE Final   Influenza B by PCR NEGATIVE NEGATIVE Final    Comment: (NOTE) The Xpert Xpress SARS-CoV-2/FLU/RSV plus assay is intended as an aid in the diagnosis of influenza from Nasopharyngeal swab specimens and should not be used as a sole basis for treatment. Nasal washings and aspirates are unacceptable for Xpert Xpress SARS-CoV-2/FLU/RSV testing.  Fact Sheet for Patients: BloggerCourse.com  Fact Sheet for Healthcare Providers: SeriousBroker.it  This test is not yet approved or cleared by the [place] FDA and has been authorized for detection and/or diagnosis of SARS-CoV-2 by FDA under an Emergency Use Authorization (EUA). This EUA will remain in effect (meaning this test can be used) for the duration of the COVID-19 declaration under Section 564(b)(1) of the Act, 21 U.S.C. section 360bbb-3(b)(1), unless the authorization is terminated or revoked.     Resp Syncytial Virus by PCR NEGATIVE NEGATIVE Final    Comment: (NOTE) Fact Sheet for Patients: BloggerCourse.com  Fact Sheet for Healthcare Providers: SeriousBroker.it  This test is not yet approved or cleared by the [place] FDA and has been authorized for detection and/or diagnosis of SARS-CoV-2 by FDA under an Emergency Use Authorization (EUA). This EUA will remain in effect (meaning this test can be used) for the duration of the COVID-19 declaration under Section 564(b)(1) of the Act, 21  U.S.C. section 360bbb-3(b)(1), unless the authorization is terminated or revoked.  Performed at [facility], [address] [place], [place] [address]          Radiology Studies: DG Chest [place] 1 View  Result Date: 11/28/2022 CLINICAL DATA:  Questionable sepsis evaluate for abnormality. EXAM: PORTABLE CHEST 1 VIEW COMPARISON:  Chest radiograph 09/01/2021.  PET-CT 08/21/2022. FINDINGS: Right chest Port-A-Cath tip projects over the superior cavoatrial junction. Low lung volumes accentuate the pulmonary vasculature and cardiomediastinal silhouette. Unchanged patchy perihilar opacities, accounting for differences in modality. No new airspace disease. No pleural effusion or pneumothorax. Visualized bones and upper abdomen are unremarkable. IMPRESSION: Unchanged patchy perihilar opacities, accounting for differences in modality. No new airspace disease. Electronically Signed   By: Orvan Falconer M.D.   On: 11/28/2022 09:01           LOS: 1 day   Time  spent= 35 mins    Lillianna Sabel Joline Maxcy, MD [facility]  If 7PM-7AM, please contact night-coverage  11/29/2022, 7:38 AM

## 2022-11-29 NOTE — Progress Notes (Signed)
PHARMACY - PHYSICIAN COMMUNICATION CRITICAL VALUE ALERT - BLOOD CULTURE IDENTIFICATION (BCID)  Jaime Gomez is an 56 y.o. female who presented to [facility] on 11/28/2022 with symptoms of malaise, myalgias, flank pain and emesis.  She has had urinary frequency and urgency.    Assessment:   5/7 Blood Cx: 1 out of 4 bottles Pseudomonas aeruginosa   Name of physician (or Provider) Contacted: Amin Ankit  Current antibiotics:  Cefepime 2gm IV q24h  Changes to prescribed antibiotics recommended:  Patient is on recommended antibiotics - No changes needed  Results for orders placed or performed during the hospital encounter of 11/28/22  Blood Culture ID Panel (Reflexed) (Collected: 11/28/2022  8:44 AM)  Result Value Ref Range   Enterococcus faecalis NOT DETECTED NOT DETECTED   Enterococcus Faecium NOT DETECTED NOT DETECTED   Listeria monocytogenes NOT DETECTED NOT DETECTED   Staphylococcus species NOT DETECTED NOT DETECTED   Staphylococcus aureus (BCID) NOT DETECTED NOT DETECTED   Staphylococcus epidermidis NOT DETECTED NOT DETECTED   Staphylococcus lugdunensis NOT DETECTED NOT DETECTED   Streptococcus species NOT DETECTED NOT DETECTED   Streptococcus agalactiae NOT DETECTED NOT DETECTED   Streptococcus pneumoniae NOT DETECTED NOT DETECTED   Streptococcus pyogenes NOT DETECTED NOT DETECTED   A.calcoaceticus-baumannii NOT DETECTED NOT DETECTED   Bacteroides fragilis NOT DETECTED NOT DETECTED   Enterobacterales NOT DETECTED NOT DETECTED   Enterobacter cloacae complex NOT DETECTED NOT DETECTED   Escherichia coli NOT DETECTED NOT DETECTED   Klebsiella aerogenes NOT DETECTED NOT DETECTED   Klebsiella oxytoca NOT DETECTED NOT DETECTED   Klebsiella pneumoniae NOT DETECTED NOT DETECTED   Proteus species NOT DETECTED NOT DETECTED   Salmonella species NOT DETECTED NOT DETECTED   Serratia marcescens NOT DETECTED NOT DETECTED   Haemophilus influenzae NOT DETECTED NOT DETECTED   Neisseria  meningitidis NOT DETECTED NOT DETECTED   Pseudomonas aeruginosa DETECTED (A) NOT DETECTED   Stenotrophomonas maltophilia NOT DETECTED NOT DETECTED   Candida albicans NOT DETECTED NOT DETECTED   Candida auris NOT DETECTED NOT DETECTED   Candida glabrata NOT DETECTED NOT DETECTED   Candida krusei NOT DETECTED NOT DETECTED   Candida parapsilosis NOT DETECTED NOT DETECTED   Candida tropicalis NOT DETECTED NOT DETECTED   Cryptococcus neoformans/gattii NOT DETECTED NOT DETECTED   CTX-M ESBL NOT DETECTED NOT DETECTED   Carbapenem resistance IMP NOT DETECTED NOT DETECTED   Carbapenem resistance KPC NOT DETECTED NOT DETECTED   Carbapenem resistance NDM NOT DETECTED NOT DETECTED   Carbapenem resistance VIM NOT DETECTED NOT DETECTED    Jaime Gomez Jaime Gomez 11/29/2022  2:07 PM

## 2022-11-29 NOTE — Procedures (Signed)
Pre Procedure Dx: Hydronephrosis Post Procedure Dx: Same  Successful left sided PCN exchange.    EBL: None No immediate complications.   PLAN:  Per patient request, consideration for a final attempt at left-sided ureteral stent placement may be performed at the time of the patient's next fluoroscopic guided exchange though if this attempt is again unsuccessful, the patient will require a chronic left-sided nephrostomy catheter.   Katherina Right, MD Pager #: [phone]

## 2022-11-30 ENCOUNTER — Inpatient Hospital Stay (HOSPITAL_COMMUNITY): Payer: 59

## 2022-11-30 DIAGNOSIS — N19 Unspecified kidney failure: Secondary | ICD-10-CM

## 2022-11-30 DIAGNOSIS — A4152 Sepsis due to Pseudomonas: Secondary | ICD-10-CM | POA: Diagnosis not present

## 2022-11-30 DIAGNOSIS — C55 Malignant neoplasm of uterus, part unspecified: Secondary | ICD-10-CM | POA: Diagnosis not present

## 2022-11-30 DIAGNOSIS — Z79811 Long term (current) use of aromatase inhibitors: Secondary | ICD-10-CM | POA: Diagnosis not present

## 2022-11-30 DIAGNOSIS — N390 Urinary tract infection, site not specified: Secondary | ICD-10-CM | POA: Diagnosis not present

## 2022-11-30 DIAGNOSIS — Z7901 Long term (current) use of anticoagulants: Secondary | ICD-10-CM

## 2022-11-30 DIAGNOSIS — A419 Sepsis, unspecified organism: Secondary | ICD-10-CM | POA: Diagnosis not present

## 2022-11-30 LAB — APTT: aPTT: 41 seconds — ABNORMAL HIGH (ref 24–36)

## 2022-11-30 LAB — GLUCOSE, CAPILLARY
Glucose-Capillary: 111 mg/dL — ABNORMAL HIGH (ref 70–99)
Glucose-Capillary: 125 mg/dL — ABNORMAL HIGH (ref 70–99)
Glucose-Capillary: 139 mg/dL — ABNORMAL HIGH (ref 70–99)
Glucose-Capillary: 148 mg/dL — ABNORMAL HIGH (ref 70–99)
Glucose-Capillary: 159 mg/dL — ABNORMAL HIGH (ref 70–99)
Glucose-Capillary: 62 mg/dL — ABNORMAL LOW (ref 70–99)
Glucose-Capillary: 68 mg/dL — ABNORMAL LOW (ref 70–99)
Glucose-Capillary: 95 mg/dL (ref 70–99)

## 2022-11-30 LAB — TYPE AND SCREEN
ABO/RH(D): A POS
Antibody Screen: POSITIVE
Donor AG Type: NEGATIVE
Donor AG Type: NEGATIVE
Unit division: 0

## 2022-11-30 LAB — PROTIME-INR
INR: 1.1 (ref 0.8–1.2)
Prothrombin Time: 14.8 seconds (ref 11.4–15.2)

## 2022-11-30 LAB — BASIC METABOLIC PANEL
Anion gap: 12 (ref 5–15)
BUN: 80 mg/dL — ABNORMAL HIGH (ref 6–20)
CO2: 15 mmol/L — ABNORMAL LOW (ref 22–32)
Calcium: 7.6 mg/dL — ABNORMAL LOW (ref 8.9–10.3)
Chloride: 103 mmol/L (ref 98–111)
Creatinine, Ser: 4.81 mg/dL — ABNORMAL HIGH (ref 0.44–1.00)
GFR, Estimated: 10 mL/min — ABNORMAL LOW (ref >60)
Glucose, Bld: 115 mg/dL — ABNORMAL HIGH (ref 70–99)
Potassium: 3.9 mmol/L (ref 3.5–5.1)
Sodium: 130 mmol/L — ABNORMAL LOW (ref 135–145)

## 2022-11-30 LAB — CULTURE, BLOOD (ROUTINE X 2): Special Requests: ADEQUATE

## 2022-11-30 LAB — BPAM RBC
Blood Product Expiration Date: 202405242359
ISSUE DATE / TIME: 202405081715
ISSUE DATE / TIME: 202405082050
Unit Type and Rh: 6200

## 2022-11-30 LAB — CBC
HCT: 27 % — ABNORMAL LOW (ref 36.0–46.0)
Hemoglobin: 9 g/dL — ABNORMAL LOW (ref 12.0–15.0)
MCH: 29.1 pg (ref 26.0–34.0)
MCHC: 33.3 g/dL (ref 30.0–36.0)
MCV: 87.4 fL (ref 80.0–100.0)
Platelets: 125 10*3/uL — ABNORMAL LOW (ref 150–400)
RBC: 3.09 MIL/uL — ABNORMAL LOW (ref 3.87–5.11)
RDW: 14.9 % (ref 11.5–15.5)
WBC: 3.9 10*3/uL — ABNORMAL LOW (ref 4.0–10.5)
nRBC: 0 % (ref 0.0–0.2)

## 2022-11-30 LAB — MAGNESIUM: Magnesium: 1.5 mg/dL — ABNORMAL LOW (ref 1.7–2.4)

## 2022-11-30 MED ORDER — ALPRAZOLAM 0.5 MG PO TABS
0.5000 mg | ORAL_TABLET | ORAL | Status: AC | PRN
  Administered 2022-12-01: 0.5 mg via ORAL
  Filled 2022-11-30: qty 1

## 2022-11-30 MED ORDER — HEPARIN (PORCINE) 25000 UT/250ML-% IV SOLN
900.0000 [IU]/h | INTRAVENOUS | Status: DC
  Administered 2022-11-30: 1000 [IU]/h via INTRAVENOUS
  Filled 2022-11-30: qty 250

## 2022-11-30 MED ORDER — MAGNESIUM SULFATE 4 GM/100ML IV SOLN
4.0000 g | Freq: Once | INTRAVENOUS | Status: AC
  Administered 2022-11-30: 4 g via INTRAVENOUS
  Filled 2022-11-30: qty 100

## 2022-11-30 MED ORDER — INSULIN ASPART 100 UNIT/ML IJ SOLN
0.0000 [IU] | Freq: Three times a day (TID) | INTRAMUSCULAR | Status: DC
  Administered 2022-12-01: 2 [IU] via SUBCUTANEOUS
  Administered 2022-12-01: 1 [IU] via SUBCUTANEOUS
  Administered 2022-12-01: 2 [IU] via SUBCUTANEOUS
  Administered 2022-12-04 (×2): 1 [IU] via SUBCUTANEOUS
  Administered 2022-12-04: 2 [IU] via SUBCUTANEOUS
  Administered 2022-12-05 (×3): 1 [IU] via SUBCUTANEOUS
  Administered 2022-12-06 – 2022-12-07 (×5): 2 [IU] via SUBCUTANEOUS
  Administered 2022-12-07: 1 [IU] via SUBCUTANEOUS
  Administered 2022-12-08: 3 [IU] via SUBCUTANEOUS
  Administered 2022-12-08 – 2022-12-09 (×3): 1 [IU] via SUBCUTANEOUS
  Administered 2022-12-09: 2 [IU] via SUBCUTANEOUS
  Administered 2022-12-10: 1 [IU] via SUBCUTANEOUS
  Administered 2022-12-10 – 2022-12-12 (×3): 2 [IU] via SUBCUTANEOUS

## 2022-11-30 MED ORDER — DEXTROSE-NACL 5-0.45 % IV SOLN: INTRAVENOUS | Status: DC

## 2022-11-30 NOTE — Inpatient Diabetes Management (Signed)
Inpatient Diabetes Program Recommendations  AACE/ADA: New Consensus Statement on Inpatient Glycemic Control (2015)  Target Ranges:  Prepandial:   less than 140 mg/dL      Peak postprandial:   less than 180 mg/dL (1-2 hours)      Critically ill patients:  140 - 180 mg/dL   Lab Results  Component Value Date   GLUCAP 62 (L) 11/30/2022   HGBA1C 8.3 (H) 11/28/2022    Review of Glycemic Control  Latest Reference Range & Units 11/29/22 23:52 11/30/22 00:18 11/30/22 00:55 11/30/22 03:49 11/30/22 07:32  Glucose-Capillary 70 - 99 mg/dL 43 (LL) 68 (L) 95 161 (H) 62 (L)  (LL): Data is critically low  Diabetes history: Type 2 DM Outpatient Diabetes medications: Levemir 20 units BID Current orders for Inpatient glycemic control: Levemir 20 units QHS, Novolog 0-15 units Q4H  Inpatient Diabetes Program Recommendations:    Noted multiple episodes of hypoglycemia. Consider reducing correction to Novolog 0-6 units Q4H.   Thanks, Lujean Rave, MSN, RNC-OB Diabetes Coordinator [phone] (8a-5p)

## 2022-11-30 NOTE — Consult Note (Addendum)
[facility]                                                                                        Patient Identification: Patient Name: Jaime Gomez MRN: [id number] Admit Date: 11/28/2022  8:13 AM Today's Date: 11/30/2022 Reason for consult: bacteremia and UTI  Requesting provider: Dr Nelson Chimes    Principal Problem:   Sepsis secondary to UTI [facility]) Active Problems:   Endometrial cancer, FIGO stage IVB (HCC)   Leg DVT (deep venous thromboembolism), chronic, right (HCC)   Acute renal failure superimposed on stage 3a chronic kidney disease (HCC)   Essential (primary) hypertension   Type 2 diabetes mellitus without complications (HCC)   Abnormality of lung on CXR   Metabolic acidosis with normal anion gap and failure of bicarbonate regeneration   Antibiotics:  Cefepime 5/7-c  Lines/Hardware: Rt chest port +   Assessment 57 year old female with h/o  DM, endometrial cancer on Femara and Afinitor ( follows Dr Myna Hidalgo) HTN, kidney stones chronic right leg DVT, anemia of CKD who presented to the ED on 5/7 with persistent fever, generalized bodyaches and weakness, flank pain and weakness  for few days. ID engaged for   # PsA bacteremia: Unclear source but high possibility of portacath being the source   # Urine cx growing Lactobacillus - patient denies GU symptoms whatsoever to me and reports urine was collected from uro bag. So , no indication to treat   Recommendations  Continue cefepime  Removed portacath, as unclear source and with port in place high risk to recur if not removed. D/w patient and she is agreeable Repeat 2 sets of blood cx as still febrile on day 2 of abtx Fu sensitivities of PsA Monitor CBC and CMP on abtx   Rest of the management as per the primary team. Please call with questions or concerns.  Thank you for the  consult  __________________________________________________________________________________________________________ HPI and Hospital Course: 57 year old female with PMH as below including DM, endometrial cancer on Femara and Afinitor ( follows Dr Myna Hidalgo) HTN, kidney stones chronic right leg DVT, anemia of CKD who presented to the ED on 5/7 with persistent fever, generalized bodyaches and weakness, flank pain and weakness  for few days.  She also complained of some raised frequency of urination as well as urgency on admit. However, denied any GU symptoms to me today. She had poor appetite. She has a left sided nephrostomy tube and also some urine coming from her urethra. She had Rt chest port placed in 11/14/21 and denies any issues like redness, pain, tenderness or swelling. She states her port was accessed long time ago and she is not using it for chemotherapy.   At ED afebrile, tachycardic, soft BP Labs remarkable for NA 127, AKI with CKD with creatinine 4.94, WBC 4, hemoglobin 7.3, platelets 120 5/7 UA with large leukocytes as well as many bacteria, urine cultures with greater than 100000 ladctobacillus spp ( reports urine was collected from urostomy) 5/8 s/p left nephrostomy exchange Given IVF and IV cefepime ID consulted for PsA bacteremia  ROS: General- Denies chills,  loss of appetite and loss of  weight HEENT - Denies headache, blurry vision, neck pain, sinus pain Chest - Denies any chest pain, SOB or cough CVS- Denies any dizziness/lightheadedness, syncopal attacks, palpitations Abdomen- Denies any nausea, vomiting, abdominal pain, hematochezia and diarrhea Neuro - Denies any weakness, numbness, tingling sensation Psych - Denies any changes in mood irritability or depressive symptoms GU- Denies any burning, dysuria, hematuria or increased frequency of urination Skin - denies any rashes/lesions MSK - denies any joint pain/swelling or restricted ROM   Past Medical History:  Diagnosis  Date   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 failure 08/18/2021   Diabetes mellitus without complication (HCC)    Endometrial cancer, FIGO stage IVB (HCC) 08/18/2021   Goals of care, counseling/discussion 08/18/2021   History of kidney stones    Hypertension    Leg DVT (deep venous thromboembolism), chronic, right (HCC) 08/18/2021   Pneumonia    Past Surgical History:  Procedure Laterality Date   ABDOMINAL HYSTERECTOMY     CYSTOSCOPY W/ URETERAL STENT PLACEMENT Bilateral 09/01/2021   Procedure: CYSTOSCOPY WITH RETROGRADE PYELOGRAM/URETERAL STENT PLACEMENT;  Surgeon: Jannifer Hick, MD;  Location: [facility];  Service: Urology;  Laterality: Bilateral;   CYSTOSCOPY WITH STENT PLACEMENT Right 05/19/2022   Procedure: CYSTOSCOPY WITH STENT CHANGE;  Surgeon: Jannifer Hick, MD;  Location: [facility];  Service: Urology;  Laterality: Right;  ONLY NEEDS 30 MIN   HERNIA REPAIR     IR CONVERT LEFT NEPHROSTOMY TO NEPHROURETERAL CATH  11/02/2021   IR CV LINE INJECTION  08/23/2021   IR IMAGING GUIDED PORT INSERTION  11/14/2021   IR NEPHROSTOMY EXCHANGE LEFT  11/02/2021   IR NEPHROSTOMY EXCHANGE LEFT  01/20/2022   IR NEPHROSTOMY EXCHANGE LEFT  11/29/2022   IR NEPHROSTOMY PLACEMENT LEFT  09/03/2021   IR REMOVAL TUN ACCESS W/ PORT W/O FL MOD SED  11/14/2021   Scheduled Meds:  acetaminophen  1,000 mg Oral Once   carvedilol  12.5 mg Oral BID WC   Chlorhexidine Gluconate Cloth  6 each Topical Daily   folic acid  1 mg Oral Q0600   gabapentin  100 mg Oral BID   insulin aspart  0-15 Units Subcutaneous Q4H   insulin detemir  20 Units Subcutaneous QHS   pneumococcal 20-valent conjugate vaccine  0.5 mL Intramuscular Tomorrow-1000   sodium bicarbonate  650 mg Oral TID   Continuous Infusions:  ceFEPime (MAXIPIME) IV 2 g (11/29/22 1150)   dextrose 5 % and 0.45% NaCl     magnesium sulfate bolus IVPB     PRN Meds:.acetaminophen **OR** acetaminophen, guaiFENesin, hydrALAZINE, HYDROmorphone (DILAUDID) injection, ipratropium-albuterol,  magnesium citrate, metoprolol tartrate, ondansetron **OR** ondansetron (ZOFRAN) IV, oxyCODONE, polyethylene glycol, senna-docusate, sorbitol, traZODone  Allergies  Allergen Reactions   Taxotere [Docetaxel] Swelling    Throat swelling   Social History   Socioeconomic History   Marital status: Single    Spouse name: Not on file   Number of children: Not on file   Years of education: Not on file   Highest education level: Not on file  Occupational History   Not on file  Tobacco Use   Smoking status: Never   Smokeless tobacco: Never  Vaping Use   Vaping Use: Never used  Substance and Sexual Activity   Alcohol use: Not Currently   Drug use: Never   Sexual activity: Not on file  Other Topics Concern   Not on file  Social History Narrative   Not on file   Social Determinants of Health   Financial Resource Strain: Low Risk  (  06/27/2022)   Overall Financial Resource Strain (CARDIA)    Difficulty of Paying Living Expenses: Not hard at all  Food Insecurity: No Food Insecurity (11/28/2022)   Hunger Vital Sign    Worried About Running Out of Food in the Last Year: Never true    Ran Out of Food in the Last Year: Never true  Transportation Needs: No Transportation Needs (11/28/2022)   PRAPARE - Administrator, Civil Service (Medical): No    Lack of Transportation (Non-Medical): No  Physical Activity: Not on file  Stress: No Stress Concern Present (06/27/2022)   [employer] of Occupational Health - Occupational Stress Questionnaire    Feeling of Stress : Not at all  Social Connections: Not on file  Intimate Partner Violence: Not At Risk (11/28/2022)   Humiliation, Afraid, Rape, and Kick questionnaire    Fear of Current or Ex-Partner: No    Emotionally Abused: No    Physically Abused: No    Sexually Abused: No   Family History  Problem Relation Age of Onset   Cancer Maternal Grandmother     Vitals BP 117/70 (BP Location: Right Arm)   Pulse 61   Temp 97.7 F  (36.5 C) (Oral)   Resp 18   Ht 5\' 5"  (1.651 m)   Wt 60.8 kg   SpO2 99%   BMI 22.30 kg/m    Physical Exam Constitutional: Adult female sitting at the edge of the bed, not in acute distress    Comments:   Cardiovascular:     Rate and Rhythm: Normal rate and regular rhythm.     Heart sounds s1 and s2  Pulmonary:     Effort: Pulmonary effort is normal on RA    Comments: Normal breath sounds   Abdominal:     Palpations: Abdomen is soft.     Tenderness: Nondistended and nontender  Musculoskeletal:        General: No swelling or tenderness in peripheral joints  Skin:    Comments: No rashes, right sided chest port with no overlying swelling, redness or swelling or tenderness or warmth. Left nephrostomy with clear urine from urobag and no surrounding cellulitis   Neurological:     General: awake, alert and oriented, grossly non focal, follows commands.    Psychiatric:        Mood and Affect: Mood normal.    Pertinent Microbiology Results for orders placed or performed during the hospital encounter of 11/28/22  Resp panel by RT-PCR (RSV, Flu A&B, Covid) Anterior Nasal Swab     Status: None   Collection Time: 11/28/22  8:44 AM   Specimen: Anterior Nasal Swab  Result Value Ref Range Status   SARS Coronavirus 2 by RT PCR NEGATIVE NEGATIVE Final    Comment: (NOTE) SARS-CoV-2 target nucleic acids are NOT DETECTED.  The SARS-CoV-2 RNA is generally detectable in upper respiratory specimens during the acute phase of infection. The lowest concentration of SARS-CoV-2 viral copies this assay can detect is 138 copies/mL. A negative result does not preclude SARS-Cov-2 infection and should not be used as the sole basis for treatment or other patient management decisions. A negative result may occur with  improper specimen collection/handling, submission of specimen other than nasopharyngeal swab, presence of viral mutation(s) within the areas targeted by this assay, and inadequate  number of viral copies(<138 copies/mL). A negative result must be combined with clinical observations, patient history, and epidemiological information. The expected result is Negative.  Fact Sheet for Patients:  BloggerCourse.com  Fact Sheet for Healthcare Providers:  SeriousBroker.it  This test is no t yet approved or cleared by the [place] FDA and  has been authorized for detection and/or diagnosis of SARS-CoV-2 by FDA under an Emergency Use Authorization (EUA). This EUA will remain  in effect (meaning this test can be used) for the duration of the COVID-19 declaration under Section 564(b)(1) of the Act, 21 U.S.C.section 360bbb-3(b)(1), unless the authorization is terminated  or revoked sooner.       Influenza A by PCR NEGATIVE NEGATIVE Final   Influenza B by PCR NEGATIVE NEGATIVE Final    Comment: (NOTE) The Xpert Xpress SARS-CoV-2/FLU/RSV plus assay is intended as an aid in the diagnosis of influenza from Nasopharyngeal swab specimens and should not be used as a sole basis for treatment. Nasal washings and aspirates are unacceptable for Xpert Xpress SARS-CoV-2/FLU/RSV testing.  Fact Sheet for Patients: BloggerCourse.com  Fact Sheet for Healthcare Providers: SeriousBroker.it  This test is not yet approved or cleared by the [place] FDA and has been authorized for detection and/or diagnosis of SARS-CoV-2 by FDA under an Emergency Use Authorization (EUA). This EUA will remain in effect (meaning this test can be used) for the duration of the COVID-19 declaration under Section 564(b)(1) of the Act, 21 U.S.C. section 360bbb-3(b)(1), unless the authorization is terminated or revoked.     Resp Syncytial Virus by PCR NEGATIVE NEGATIVE Final    Comment: (NOTE) Fact Sheet for Patients: BloggerCourse.com  Fact Sheet for Healthcare  Providers: SeriousBroker.it  This test is not yet approved or cleared by the [place] FDA and has been authorized for detection and/or diagnosis of SARS-CoV-2 by FDA under an Emergency Use Authorization (EUA). This EUA will remain in effect (meaning this test can be used) for the duration of the COVID-19 declaration under Section 564(b)(1) of the Act, 21 U.S.C. section 360bbb-3(b)(1), unless the authorization is terminated or revoked.  Performed at [facility], [address] [place], [place] [address]   Blood Culture (routine x 2)     Status: Abnormal (Preliminary result)   Collection Time: 11/28/22  8:44 AM   Specimen: BLOOD  Result Value Ref Range Status   Specimen Description   Final    BLOOD BLOOD RIGHT FOREARM Performed at [facility], [address] [place], [place] [address]    Special Requests   Final    Blood Culture results may not be optimal due to an excessive volume of blood received in culture bottles BOTTLES DRAWN AEROBIC AND ANAEROBIC Performed at [facility], [address] [place], [place] [address]    Culture  Setup Time   Final    GRAM NEGATIVE RODS ANAEROBIC BOTTLE ONLY CRITICAL RESULT CALLED TO, READ BACK BY AND VERIFIED WITH: L. POINDEXTER PHARMD, AT 1351 11/29/22 D. VANHOOK    Culture (A)  Final    PSEUDOMONAS AERUGINOSA SUSCEPTIBILITIES TO FOLLOW Performed at [facility], [address], [place], [place] [address]    Report Status PENDING  Incomplete  Urine Culture     Status: Abnormal   Collection Time: 11/28/22  8:44 AM   Specimen: Urine, Random  Result Value Ref Range Status   Specimen Description   Final    URINE, RANDOM Performed at [facility], [address] [place], [place] [address]    Special Requests   Final    NONE Reflexed from [id number] Performed at [facility], [address] [place], [place] [address]    Culture (A)  Final     >=100,000 COLONIES/mL LACTOBACILLUS SPECIES Standardized susceptibility testing for this organism is not available. Performed at [facility], [address], [place], [place] [address]    Report Status 11/29/2022 FINAL  Final  Blood Culture ID Panel (Reflexed)     Status: Abnormal   Collection Time: 11/28/22  8:44 AM  Result Value Ref Range Status   Enterococcus faecalis NOT DETECTED NOT DETECTED Final   Enterococcus Faecium NOT DETECTED NOT DETECTED Final   Listeria monocytogenes NOT DETECTED NOT DETECTED Final   Staphylococcus species NOT DETECTED NOT DETECTED Final   Staphylococcus aureus (BCID) NOT DETECTED NOT DETECTED Final   Staphylococcus epidermidis NOT DETECTED NOT DETECTED Final   Staphylococcus lugdunensis NOT DETECTED NOT DETECTED Final   Streptococcus species NOT DETECTED NOT DETECTED Final   Streptococcus agalactiae NOT DETECTED NOT DETECTED Final   Streptococcus pneumoniae NOT DETECTED NOT DETECTED Final   Streptococcus pyogenes NOT DETECTED NOT DETECTED Final   A.calcoaceticus-baumannii NOT DETECTED NOT DETECTED Final   Bacteroides fragilis NOT DETECTED NOT DETECTED Final   Enterobacterales NOT DETECTED NOT DETECTED Final   Enterobacter cloacae complex NOT DETECTED NOT DETECTED Final   Escherichia coli NOT DETECTED NOT DETECTED Final   Klebsiella aerogenes NOT DETECTED NOT DETECTED Final   Klebsiella oxytoca NOT DETECTED NOT DETECTED Final   Klebsiella pneumoniae NOT DETECTED NOT DETECTED Final   Proteus species NOT DETECTED NOT DETECTED Final   Salmonella species NOT DETECTED NOT DETECTED Final   Serratia marcescens NOT DETECTED NOT DETECTED Final   Haemophilus influenzae NOT DETECTED NOT DETECTED Final   Neisseria meningitidis NOT DETECTED NOT DETECTED Final   Pseudomonas aeruginosa DETECTED (A) NOT DETECTED Final    Comment: CRITICAL RESULT CALLED TO, READ BACK BY AND VERIFIED WITH: L. POINDEXTER PHARMD, AT 1351 11/29/22 D. VANHOOK    Stenotrophomonas  maltophilia NOT DETECTED NOT DETECTED Final   Candida albicans NOT DETECTED NOT DETECTED Final   Candida auris NOT DETECTED NOT DETECTED Final   Candida glabrata NOT DETECTED NOT DETECTED Final   Candida krusei NOT DETECTED NOT DETECTED Final   Candida parapsilosis NOT DETECTED NOT DETECTED Final   Candida tropicalis NOT DETECTED NOT DETECTED Final   Cryptococcus neoformans/gattii NOT DETECTED NOT DETECTED Final   CTX-M ESBL NOT DETECTED NOT DETECTED Final   Carbapenem resistance IMP NOT DETECTED NOT DETECTED Final   Carbapenem resistance KPC NOT DETECTED NOT DETECTED Final   Carbapenem resistance NDM NOT DETECTED NOT DETECTED Final   Carbapenem resistance VIM NOT DETECTED NOT DETECTED Final    Comment: Performed at [facility], [address], [place], [place] [address]  Blood Culture (routine x 2)     Status: None (Preliminary result)   Collection Time: 11/28/22  9:02 AM   Specimen: BLOOD  Result Value Ref Range Status   Specimen Description   Final    BLOOD RIGHT ANTECUBITAL Performed at [facility], [address] [place], [place] [address]    Special Requests   Final    Blood Culture adequate volume BOTTLES DRAWN AEROBIC AND ANAEROBIC Performed at [facility], [address] [place], [place] [address]    Culture   Final    NO GROWTH 2 DAYS Performed at [facility], [address], [place], [place] [address]    Report Status PENDING  Incomplete   Pertinent Lab seen by  me:    Latest Ref Rng & Units 11/30/2022    3:15 AM 11/29/2022    4:03 AM 11/28/2022    8:44 AM  CBC  WBC 4.0 - 10.5 K/uL 3.9  3.4  4.0   Hemoglobin 12.0 - 15.0 g/dL 9.0  7.1  7.3   Hematocrit 36.0 - 46.0 % 27.0  22.5  22.1   Platelets 150 - 400 K/uL 125  122  120       Latest Ref Rng & Units 11/30/2022    3:15 AM 11/29/2022    4:03 AM 11/28/2022    8:44 AM  CMP  Glucose 70 - 99 mg/dL 161  84  096   BUN 6 - 20 mg/dL 80  80  74   Creatinine 0.44 - 1.00 mg/dL 0.45  4.09   8.11   Sodium 135 - 145 mmol/L 130  130  127   Potassium 3.5 - 5.1 mmol/L 3.9  3.4  3.8   Chloride 98 - 111 mmol/L 103  101  99   CO2 22 - 32 mmol/L 15  17  13    Calcium 8.9 - 10.3 mg/dL 7.6  8.0  7.8   Total Protein 6.5 - 8.1 g/dL  6.3  6.5   Total Bilirubin 0.3 - 1.2 mg/dL  0.5  0.4   Alkaline Phos 38 - 126 U/L  82  100   AST 15 - 41 U/L  76  23   ALT 0 - 44 U/L  11  8      Pertinent Imagings/Other Imagings Plain films and CT images have been personally visualized and interpreted; radiology reports have been reviewed. Decision making incorporated into the Impression / Recommendations.  IR NEPHROSTOMY EXCHANGE LEFT  Result Date: 11/29/2022 INDICATION: History cervical malignancy with bilateral obstructive uropathy, post right-sided ureteral stent placement with failed attempt at left-sided ureteral stent placement, initially post left-sided percutaneous nephrostomy catheter placement on 09/03/2021. 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 Patient now admitted with urinary tract infection and request made for left-sided percutaneous nephrostomy catheter exchange. EXAM: FLUOROSCOPIC GUIDED LEFT SIDED NEPHROSTOMY CATHETER EXCHANGE COMPARISON:  Attempted left-sided ureteral stent placement-01/20/2022; 11/02/2021 Image guided left-sided nephrostomy catheter placement-09/03/2021 PET-CT-08/21/2022 CONTRAST:  5 cc Omnipaque 300, administered into the left renal collecting system. FLUOROSCOPY TIME:  49 seconds (5 mGy) COMPLICATIONS: None immediate. TECHNIQUE: Informed written consent was obtained from the patient after a discussion of the risks, benefits and alternatives to treatment. Questions regarding the procedure were encouraged and answered. A timeout was performed prior to the initiation of the  procedure. The left flank and external portion of existing nephrostomy catheter were prepped and draped in the usual sterile fashion. A sterile drape was applied covering the operative field. Maximum barrier sterile technique with sterile gowns and gloves were used for the procedure. A timeout was performed prior to the initiation of the procedure. A pre procedural spot fluoroscopic image was obtained after contrast was injected via the existing nephrostomy catheter demonstrating appropriate positioning within the renal pelvis. The existing nephrostomy catheter was cut and cannulated with a Benson wire which was coiled within the renal pelvis. Under intermittent fluoroscopic guidance, the existing nephrostomy catheter was exchanged for a new 10.2 [place] all-purpose drainage catheter. Contrast injection confirmed appropriate positioning within the renal pelvis and a post exchange fluoroscopic image  was obtained. The catheter was locked and secured to the skin with a suture. A dressing was applied. The patient tolerated the procedure well without immediate postprocedural complication. FINDINGS: The existing nephrostomy catheter is appropriately positioned and functioning. Antegrade nephrostogram demonstrate patency of the left ureter to the level of the urinary bladder. 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 After successful fluoroscopic guided exchange, the new nephrostomy catheter is coiled and locked within the left renal pelvis. IMPRESSION: Successful fluoroscopic guided exchange of left sided 10.2 French percutaneous nephrostomy catheter. PLAN: Per patient request, consideration for a final attempt at left-sided ureteral stent placement may be performed at the time of the patient's next fluoroscopic guided exchange though if this attempt is again unsuccessful, the patient will  require a chronic left-sided nephrostomy catheter. Electronically Signed   By: Simonne Come M.D.   On: 11/29/2022 10:21   DG Chest Port 1 View  Result Date: 11/28/2022 CLINICAL DATA:  Questionable sepsis evaluate for abnormality. EXAM: PORTABLE CHEST 1 VIEW COMPARISON:  Chest radiograph 09/01/2021.  PET-CT 08/21/2022. FINDINGS: Right chest Port-A-Cath tip projects over the superior cavoatrial junction. Low lung volumes accentuate the pulmonary vasculature and cardiomediastinal silhouette. Unchanged patchy perihilar opacities, accounting for differences in modality. No new airspace disease. No pleural effusion or pneumothorax. Visualized bones and upper abdomen are unremarkable. IMPRESSION: Unchanged patchy perihilar opacities, accounting for differences in modality. No new airspace disease. Electronically Signed   By: Orvan Falconer M.D.   On: 11/28/2022 09:01     I have personally spent 100  minutes involved in face-to-face and non-face-to-face activities for this patient on the day of the visit. Professional time spent includes the following activities: Preparing to see the patient (review of tests), Obtaining and/or reviewing separately obtained history (admission/discharge record), Performing a medically appropriate examination and/or evaluation , Ordering medications/tests/procedures, referring and communicating with other health care professionals, Documenting clinical information in the EMR, Independently interpreting results (not separately reported), Communicating results to the patient/family/caregiver, Counseling and educating the patient/family/caregiver and Care coordination (not separately reported).  Electronically signed by:   Plan d/w requesting provider as well as ID pharm D  Note: This document was prepared using dragon voice recognition software and may include unintentional dictation errors.   Odette Fraction, MD Infectious Disease Physician [facility]  [facility] Pager: [phone]

## 2022-11-30 NOTE — Progress Notes (Addendum)
ANTICOAGULATION CONSULT NOTE - Initial Consult  Pharmacy Consult for Heparin Indication: history DVT  Allergies  Allergen Reactions   Taxotere [Docetaxel] Swelling    Throat swelling    Patient Measurements: Height: 5\' 5"  (165.1 cm) Weight: 60.8 kg (134 lb) IBW/kg (Calculated) : 57 Heparin Dosing Weight: 60.8 kg  Vital Signs: Temp: 97.7 F (36.5 C) (05/09 0352) Temp Source: Oral (05/09 0352) BP: 117/70 (05/09 0352) Pulse Rate: 61 (05/09 0352)  Labs: Recent Labs    11/28/22 0844 11/29/22 0403 11/30/22 0315  HGB 7.3* 7.1* 9.0*  HCT 22.1* 22.5* 27.0*  PLT 120* 122* 125*  APTT 49*  --   --   LABPROT 14.7  --   --   INR 1.2  --   --   CREATININE 4.94* 5.15* 4.81*    Estimated Creatinine Clearance: 11.5 mL/min (A) (by C-G formula based on SCr of 4.81 mg/dL (H)).   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 Last dose per patient 5/6 in morning.  Assessment: Patient is an 57 y.o female admitted on 5/7 for sepsis secondary to urinary tract infection.  Now with Pseudomonas bacteremia and AKI on CKD.  On 5/8 nephrostomy exchanged by IR.  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        Goal of Therapy:  Heparin level 0.3-0.7 units/ml Monitor platelets by anticoagulation protocol: Yes   Plan:  -Omit Bolus -Start heparin infusion at 1000 units/hr -Check anti-Xa level in 8 hours and daily while on heparin -Daily CBC -Continue to monitor H&H and platelets -Monitor for signs/symptoms of bleeding   Fredi Geiler W [place] Gambone 11/30/2022,11:58 AM

## 2022-11-30 NOTE — Plan of Care (Signed)
  Problem: Activity: Goal: Risk for activity intolerance will decrease Outcome: Progressing   Problem: Nutrition: Goal: Adequate nutrition will be maintained Outcome: Progressing   Problem: Elimination: Goal: Will not experience complications related to bowel motility Outcome: Progressing Goal: Will not experience complications related to urinary retention Outcome: Progressing   

## 2022-11-30 NOTE — Progress Notes (Signed)
   11/30/22 0002  Provider Notification  Provider Name/Title Anthoney Harada, NP  Date Provider Notified 11/30/22  Time Provider Notified 0002  Method of Notification  (secure chat)  Notification Reason Other (Comment) (CBG 43)  Provider response No new orders (MD aware)  Date of Provider Response 11/30/22  Time of Provider Response [phone]

## 2022-11-30 NOTE — Progress Notes (Signed)
Notified by 4th floor bedside RN that patient was complaining of difficulty speaking but per RN's assessment patient did not have any change in speech. Patient states that "she has been dealing with this all day. Patient was very emotional and anxious while speaking. Patient was alert and oriented x 3 to 4. She stated "that she didn't want to come to the hospital because she had a fear of dying or dying within the hospital." Patient's sister also at beside and stated that " sister had been down or depressed for the last 30 days." Patient is able to follow commands, and speak clearly. When emotional, patient is having difficulty focusing or concentrating. Patient able to move all extremities but weak upon exam. Patient was having a mild tremor in left arm/hand when initially very emotional and anxious but as patient began to calm down, the tremor stopped. Patient had no focal neuro deficits present at this time. PERRLA intact, EOM intact, and no facial droop present. Notified MD Nelson Chimes about patient's complaint and anxiety/depression. MD Amin placed orders for routine CT scan of head as well as prn xanax. Discussed with beside RN plan about obtaining CT scan, ordering Chaplain consult, and interventions to decrease anxiety.  Please call Rapid Response if patient has any change in neuro status or hemodynamic status.

## 2022-11-30 NOTE — TOC Initial Note (Signed)
Transition of Care [facility]) - Initial/Assessment Note    Patient Details  Name: Jaime Gomez MRN: [id number] Date of Birth: Feb 20, 1966  [facility]) CM/SW Contact:    Howell Rucks, RN Phone Number: 11/30/2022, 10:31 AM  Clinical Narrative:   Met with pt at bedside to introduce TOC/NCM role and review for possible dc needs. Pt reports she lives with her sister who assist with her care,  pt reports she cares for her nephrostomy tube(changed out with current hospitalization), reports home DME-cane, walker. Pt reports her sister will provide transport. No further TOC needs identified.                 Expected Discharge Plan: Home/Self Care Barriers to Discharge: Continued Medical Work up   Patient Goals and CMS Choice Patient states their goals for this hospitalization and ongoing recovery are:: Home   Choice offered to / list presented to : Patient      Expected Discharge Plan and Services   Discharge Planning Services: CM Consult                                          Prior Living Arrangements/Services   Lives with:: Relatives Patient language and need for interpreter reviewed:: Yes Do you feel safe going back to the place where you live?: Yes      Need for Family Participation in Patient Care: Yes (Comment) Care giver support system in place?: Yes (comment) Current home services: DME (walker, canes) Criminal Activity/Legal Involvement Pertinent to Current Situation/Hospitalization: No - Comment as needed  Activities of Daily Living Home Assistive Devices/Equipment: Wheelchair ADL Screening (condition at time of admission) Patient's cognitive ability adequate to safely complete daily activities?: Yes Is the patient deaf or have difficulty hearing?: No Does the patient have difficulty seeing, even when wearing glasses/contacts?: No Does the patient have difficulty concentrating, remembering, or making decisions?: No Patient able to express need for  assistance with ADLs?: Yes Does the patient have difficulty dressing or bathing?: No Independently performs ADLs?: Yes (appropriate for developmental age) Does the patient have difficulty walking or climbing stairs?: Yes Weakness of Legs: Both Weakness of Arms/Hands: Both  Permission Sought/Granted Permission sought to share information with : Case Manager Permission granted to share information with : Yes, Verbal Permission Granted  Share Information with NAME: Fannie Knee, RN           Emotional Assessment Appearance:: Appears stated age Attitude/Demeanor/Rapport: Gracious Affect (typically observed): Accepting Orientation: : Oriented to Self, Oriented to Place, Oriented to  Time, Oriented to Situation Alcohol / Substance Use: Not Applicable Psych Involvement: No (comment)  Admission diagnosis:  Sepsis secondary to UTI (HCC) [A41.9, N39.0] Urinary tract infection associated with nephrostomy catheter, initial encounter (HCC) [X52.841L, N39.0] Sepsis with acute renal failure without septic shock, due to unspecified organism, unspecified acute renal failure type (HCC) [A41.9, R65.20, N17.9] Sepsis due to gram-negative UTI (HCC) [A41.50, N39.0] Patient Active Problem List   Diagnosis Date Noted   Sepsis secondary to UTI (HCC) 11/28/2022   Sepsis due to gram-negative UTI (HCC) 11/28/2022   Abnormality of lung on CXR 11/28/2022   Metabolic acidosis with normal anion gap and failure of bicarbonate regeneration 11/28/2022   IDA (iron deficiency anemia) 04/28/2022   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   Hyperkalemia 09/02/2021   Acute renal failure superimposed on stage 3a chronic kidney disease (HCC) 09/01/2021  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) 09/01/2021   Anemia due to antineoplastic chemotherapy (CODE) 09/01/2021   Chronic kidney disease, stage 3a (HCC) 09/01/2021   Essential (primary) hypertension 09/01/2021   Gastroparesis 09/01/2021   Personal  history of other venous thrombosis and embolism 09/01/2021   Personal history of pulmonary embolism 09/01/2021   Type 2 diabetes mellitus without complications (HCC) 09/01/2021   Endometrial cancer, FIGO stage IVB (HCC) 08/18/2021   Goals of care, counseling/discussion 08/18/2021   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 failure 08/18/2021   Leg DVT (deep venous thromboembolism), chronic, right (HCC) 08/18/2021   PCP:  Staci Acosta, FNP Pharmacy:   [facility] [phone] [place], [place] - [address] AT [facility] [address] [place] [address] Phone: [phone] Fax: [phone]     Social Determinants of Health (SDOH) Social History: SDOH Screenings   Food Insecurity: No Food Insecurity (11/28/2022)  Housing: Low Risk  (11/28/2022)  Transportation Needs: No Transportation Needs (11/28/2022)  Utilities: Not At Risk (11/28/2022)  Depression (PHQ2-9): Low Risk  (09/18/2022)  Financial Resource Strain: Low Risk  (06/27/2022)  Stress: No Stress Concern Present (06/27/2022)  Tobacco Use: Low Risk  (11/29/2022)   SDOH Interventions:     Readmission Risk Interventions    11/30/2022   10:29 AM  Readmission Risk Prevention Plan  Transportation Screening Complete  PCP or Specialist Appt within 3-5 Days Complete  HRI or Home Care Consult Complete  Social Work Consult for Recovery Care Planning/Counseling Complete  Palliative Care Screening Not Applicable  Medication Review Oceanographer) Complete

## 2022-11-30 NOTE — Consult Note (Signed)
Jaime Gomez is well-known to me.  She is a very nice 57 year old old white female.  We actually had seen her after she moved from [facility].  She has been Tried area now for little over a year.  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  She has been on treatment with Femara and Afinitor.  She actually has done pretty well with this.  We were following her CA 125.  She has had issues with renal failure.  She has had obstruction.  She has a nephrostomy tube in.  She has history of thromboembolic disease.  She is on lifelong anticoagulation.  She has been on Arixtra.  She had no problems with Arixtra.  We last saw her on 11/16/2022.  Her CA125 was on the high side at 518 back in March.  However, in April, it was 255.  I want her to get a PET scan so we can see how everything looked and to see if we needed to make an adjustment with her protocol.  She has chronic renal failure.  She gets transfusions on occasion.  She unfortunately was admitted on 11/28/2022.  She came in with sepsis.  She had bacteremia.  She grew Pseudomonas in her blood.  She will lactobacillus in her urine.  When she was admitted, her blood sugar was 321.  Her BUN was 74 creatinine 4.94.  This morning, her BUN is 80 creatinine 4.81.  Blood sugar is 115.  Her white cell count is 3.9.  Hemoglobin 9.  Platelet count 125,000.  We did transfuse her back in April.  When we saw her, her hemoglobin was 5.  I really cannot use ESA because of her history of thromboembolic disease.  There is been no problems with bleeding.  She did have the nephrostomy tube changed.  Her appetite is doing okay.  She really has had no problems with nausea or vomiting.  Patient does have chronic pain issues but these seem to be under relatively good control.  This morning, she looks quite good.  She sounds good.  I think she may have been transfused yesterday.   On physical exam, her vital signs show temperature of 97.7.   Pulse 61.  Blood pressure 117/70.  Her head and neck exam shows no ocular or oral lesions.  She has no adenopathy in the neck.  There is no mucositis.  She has no scleral icterus.  Her lungs sound relatively clear bilaterally.  She has no wheezes bilaterally.  Cardiac exam regular rate and rhythm.  She has a 1/6 systolic ejection murmur.  Abdomen is soft.  There is no distention.  She has no fluid wave.  There is no obvious abdominal mass.  There is no palpable liver or spleen tip.  Extremity shows no clubbing, cyanosis or edema.  She has good muscle tone in her legs.  She has good range of motion of her joints.  Neurological exam shows no focal neurological deficits.   Jaime Gomez is a very charming 57 year old white female.  She has metastatic uterine cancer.  She has she has done incredibly well with this being on an oral regimen.  I want to have a PET scan done so we can see how everything was looking with respect to her disease progression.  I think the fact that her last CA-125 was actually improved is encouraging.  I know she is was had renal failure.  This has been chronic.  She is still urinating.  I think the real problem is this Pseudomonas bacteremia.  She is on Maxipime.  We will have to see what the sensitivity is on the Pseudomonas.  As far as anticoagulation goes, she needs to have some anticoagulation.  She really has had no problems with the Arixtra.  We could always adjust the dose.  We will follow along and try to help out any way that we can.  I do appreciate the great care that she is getting from all the staff on [address].   [name], MD  Romans 1:16

## 2022-11-30 NOTE — Progress Notes (Signed)
PROGRESS NOTE    Jaime Gomez  YNW:[id number] DOB: 12/15/1965 DOA: 11/28/2022 PCP: Staci Acosta, FNP   Brief Narrative:  57 y.o. 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 The patient has had placement of a left sided nephrostomy tube which was due for replacement on 11/29/2022.  Patient admitted for sepsis secondary to urinary tract infection.  IR consulted for nephrostomy tube change.  Patient tolerated the procedure well.  Due to anemia, 2 units PRBC was given with appropriate response.  Hospital course complicated by acute kidney injury which is slowly improving.   Assessment & Plan:  Principal Problem:   Sepsis secondary to UTI [facility]) Active Problems:   Endometrial cancer, FIGO stage IVB (HCC)   Leg DVT (deep venous thromboembolism), chronic, right (HCC)   Acute renal failure superimposed on stage 3a chronic kidney disease (HCC)   Essential (primary) hypertension   Type 2 diabetes mellitus without complications (HCC)   Abnormality of lung on CXR   Metabolic acidosis with normal anion gap and failure of bicarbonate regeneration     Assessment and Plan: * Sepsis secondary to UTI (HCC) Pseudomonas bacteremia Sepsis physiology stable for now.  Plans for nephrostomy tube change by IR 5/8.  Blood cultures growing Pseudomonas, sensitivities pending.  Will adjust per sensitivity.  Currently on cefepime Ideally Chemo-Port will need to be removed per infectious disease but at this time oncology does not want her out.  Will continue to monitor.  Acute renal failure superimposed on stage 3a chronic kidney disease (HCC) Baseline creatinine 2.6, admission creatinine 4.9 > 5.15 > 4.81 Continue IV fluids Renal ultrasound-pending  Hypomagnesemia/hypokalemia - As needed  repletion  Metabolic acidosis with normal anion gap and failure of bicarbonate regeneration Secondary to AKI, continue sodium bicarb 3 times daily  Abnormality of lung on CXR The patient has persistent bilateral perihilar patchy opacities on CXR. They have previously been present on CXR of 08/2021 and PET-CT of 08/21/2022. Monitor. No current symptoms of respiratory illness.  Anemia of Chronic Dz Baseline Hb 9.5, slowly hemoglobin drifted down to 7.1.  Routinely gets outpatient transfusion.  Received 2 units PRBC 5/8.  Hemoglobin stable 9.0  Type 2 diabetes mellitus without complications (HCC) Peripheral neuropathy -Sliding scale and Accu-Chek. Levemir 20 units at bedtime Gabapentin  Essential (primary) hypertension On Coreg, can be held for soft blood pressure IV as needed   Leg DVT (deep venous thromboembolism), chronic, right (HCC) The patient is on arixtra chronically.  Currently Arixtra is not a good option given renal function.  Reviewed risk of underdosing or overdosing.  For now we will place on heparin drip.   Dr Myna Hidalgo consulted, prefers to use Arixtra despite renal function given previous failure to Eliquis  Endometrial cancer, FIGO stage IVB (HCC) On outpatient chemotherapy    DVT prophylaxis: Heparin drip Code Status: Full Family Communication:   Status is: Inpatient Ongoing evaluation for urinary tract infection, anemia and acute kidney injury       Diet Orders (From admission, onward)     Start     Ordered   11/29/22 0956  Diet Carb Modified Fluid consistency: Thin; Room service appropriate? Yes  Diet effective now       Question Answer Comment  Diet-HS Snack? Nothing   Calorie Level Medium 1600-2000   Fluid consistency: Thin  Room service appropriate? Yes      11/29/22 0955            Subjective: Doing okay no complaints.  Examination: Constitutional: Not in acute distress Respiratory: Clear to auscultation bilaterally Cardiovascular:  Normal sinus rhythm, no rubs Abdomen: Nontender nondistended good bowel sounds Musculoskeletal: No edema noted Skin: No rashes seen Neurologic: CN 2-12 grossly intact.  And nonfocal Psychiatric: Normal judgment and insight. Alert and oriented x 3. Normal mood. Chemo-Port in the right chest wall site looks okay Objective: Vitals:   11/29/22 2054 11/29/22 2110 11/29/22 2355 11/30/22 0352  BP: 102/63 103/65 100/63 117/70  Pulse: 74 72 61 61  Resp: 17 16 18 18   Temp: 99.1 F (37.3 C) 99.2 F (37.3 C) 98.1 F (36.7 C) 97.7 F (36.5 C)  TempSrc: Oral Oral Oral Oral  SpO2: 98% 98% 98% 99%  Weight:      Height:        Intake/Output Summary (Last 24 hours) at 11/30/2022 0739 Last data filed at 11/30/2022 0500 Gross per 24 hour  Intake 2398.23 ml  Output 575 ml  Net 1823.23 ml   Filed Weights   11/28/22 0819  Weight: 60.8 kg    Scheduled Meds:  acetaminophen  1,000 mg Oral Once   carvedilol  12.5 mg Oral BID WC   Chlorhexidine Gluconate Cloth  6 each Topical Daily   folic acid  1 mg Oral Q0600   gabapentin  100 mg Oral BID   insulin aspart  0-15 Units Subcutaneous Q4H   insulin detemir  20 Units Subcutaneous QHS   pneumococcal 20-valent conjugate vaccine  0.5 mL Intramuscular Tomorrow-1000   sodium bicarbonate  650 mg Oral TID   Continuous Infusions:  ceFEPime (MAXIPIME) IV 2 g (11/29/22 1150)   dextrose 5 % and 0.45% NaCl 75 mL/hr at 11/30/22 0000   magnesium sulfate bolus IVPB      Nutritional status     Body mass index is 22.3 kg/m.  Data Reviewed:   CBC: Recent Labs  Lab 11/28/22 0844 11/29/22 0403 11/30/22 0315  WBC 4.0 3.4* 3.9*  NEUTROABS 3.3  --   --   HGB 7.3* 7.1* 9.0*  HCT 22.1* 22.5* 27.0*  MCV 81.3 85.6 87.4  PLT 120* 122* 125*   Basic Metabolic Panel: Recent Labs  Lab 11/28/22 0844 11/29/22 0403 11/30/22 0315  NA 127* 130* 130*  K 3.8 3.4* 3.9  CL 99 101 103  CO2 13* 17* 15*  GLUCOSE 321* 84 115*  BUN 74* 80* 80*  CREATININE 4.94*  5.15* 4.81*  CALCIUM 7.8* 8.0* 7.6*  MG  --   --  1.5*   GFR: Estimated Creatinine Clearance: 11.5 mL/min (A) (by C-G formula based on SCr of 4.81 mg/dL (H)). Liver Function Tests: Recent Labs  Lab 11/28/22 0844 11/29/22 0403  AST 23 76*  ALT 8 11  ALKPHOS 100 82  BILITOT 0.4 0.5  PROT 6.5 6.3*  ALBUMIN 2.4* 2.3*   No results for input(s): "LIPASE", "AMYLASE" in the last 168 hours. No results for input(s): "AMMONIA" in the last 168 hours. Coagulation Profile: Recent Labs  Lab 11/28/22 0844  INR 1.2   Cardiac Enzymes: No results for input(s): "CKTOTAL", "CKMB", "CKMBINDEX", "TROPONINI" in the last 168 hours. BNP (last 3 results) No results for input(s): "PROBNP" in the last 8760 hours. HbA1C: Recent Labs    11/28/22 1610  HGBA1C 8.3*   CBG: Recent Labs  Lab 11/29/22 2352 11/30/22 0018 11/30/22 0055 11/30/22 4098  11/30/22 0732  GLUCAP 43* 68* 95 125* 62*   Lipid Profile: No results for input(s): "CHOL", "HDL", "LDLCALC", "TRIG", "CHOLHDL", "LDLDIRECT" in the last 72 hours. Thyroid Function Tests: No results for input(s): "TSH", "T4TOTAL", "FREET4", "T3FREE", "THYROIDAB" in the last 72 hours. Anemia Panel: No results for input(s): "VITAMINB12", "FOLATE", "FERRITIN", "TIBC", "IRON", "RETICCTPCT" in the last 72 hours. Sepsis Labs: Recent Labs  Lab 11/28/22 0844  LATICACIDVEN 1.3    Recent Results (from the past 240 hour(s))  Resp panel by RT-PCR (RSV, Flu A&B, Covid) Anterior Nasal Swab     Status: None   Collection Time: 11/28/22  8:44 AM   Specimen: Anterior Nasal Swab  Result Value Ref Range Status   SARS Coronavirus 2 by RT PCR NEGATIVE NEGATIVE Final    Comment: (NOTE) SARS-CoV-2 target nucleic acids are NOT DETECTED.  The SARS-CoV-2 RNA is generally detectable in upper respiratory specimens during the acute phase of infection. The lowest concentration of SARS-CoV-2 viral copies this assay can detect is 138 copies/mL. A negative result does not  preclude SARS-Cov-2 infection and should not be used as the sole basis for treatment or other patient management decisions. A negative result may occur with  improper specimen collection/handling, submission of specimen other than nasopharyngeal swab, presence of viral mutation(s) within the areas targeted by this assay, and inadequate number of viral copies(<138 copies/mL). A negative result must be combined with clinical observations, patient history, and epidemiological information. The expected result is Negative.  Fact Sheet for Patients:  BloggerCourse.com  Fact Sheet for Healthcare Providers:  SeriousBroker.it  This test is no t yet approved or cleared by the [place] FDA and  has been authorized for detection and/or diagnosis of SARS-CoV-2 by FDA under an Emergency Use Authorization (EUA). This EUA will remain  in effect (meaning this test can be used) for the duration of the COVID-19 declaration under Section 564(b)(1) of the Act, 21 U.S.C.section 360bbb-3(b)(1), unless the authorization is terminated  or revoked sooner.       Influenza A by PCR NEGATIVE NEGATIVE Final   Influenza B by PCR NEGATIVE NEGATIVE Final    Comment: (NOTE) The Xpert Xpress SARS-CoV-2/FLU/RSV plus assay is intended as an aid in the diagnosis of influenza from Nasopharyngeal swab specimens and should not be used as a sole basis for treatment. Nasal washings and aspirates are unacceptable for Xpert Xpress SARS-CoV-2/FLU/RSV testing.  Fact Sheet for Patients: BloggerCourse.com  Fact Sheet for Healthcare Providers: SeriousBroker.it  This test is not yet approved or cleared by the [place] FDA and has been authorized for detection and/or diagnosis of SARS-CoV-2 by FDA under an Emergency Use Authorization (EUA). This EUA will remain in effect (meaning this test can be used) for the  duration of the COVID-19 declaration under Section 564(b)(1) of the Act, 21 U.S.C. section 360bbb-3(b)(1), unless the authorization is terminated or revoked.     Resp Syncytial Virus by PCR NEGATIVE NEGATIVE Final    Comment: (NOTE) Fact Sheet for Patients: BloggerCourse.com  Fact Sheet for Healthcare Providers: SeriousBroker.it  This test is not yet approved or cleared by the [place] FDA and has been authorized for detection and/or diagnosis of SARS-CoV-2 by FDA under an Emergency Use Authorization (EUA). This EUA will remain in effect (meaning this test can be used) for the duration of the COVID-19 declaration under Section 564(b)(1) of the Act, 21 U.S.C. section 360bbb-3(b)(1), unless the authorization is terminated or revoked.  Performed at [facility], [address]  [place], [place] [address]   Blood Culture (routine x 2)     Status: None (Preliminary result)   Collection Time: 11/28/22  8:44 AM   Specimen: BLOOD  Result Value Ref Range Status   Specimen Description   Final    BLOOD BLOOD RIGHT FOREARM Performed at [facility], [address] [place], [place] [address]    Special Requests   Final    Blood Culture results may not be optimal due to an excessive volume of blood received in culture bottles BOTTLES DRAWN AEROBIC AND ANAEROBIC Performed at [facility], [address] [place], [place] [address]    Culture  Setup Time   Final    GRAM NEGATIVE RODS ANAEROBIC BOTTLE ONLY Organism ID to follow CRITICAL RESULT CALLED TO, READ BACK BY AND VERIFIED WITH: L. Loletta Specter PHARMD, AT 1351 11/29/22 Renato Shin Performed at [facility], [address] [place], [place] [address]    Culture GRAM NEGATIVE RODS  Final   Report Status PENDING  Incomplete  Urine Culture     Status: Abnormal   Collection Time: 11/28/22  8:44 AM   Specimen: Urine, Random  Result Value Ref  Range Status   Specimen Description   Final    URINE, RANDOM Performed at [facility], [address] [place], [place] [address]    Special Requests   Final    NONE Reflexed from [id number] Performed at [facility], [address] [place], [place] [address]    Culture (A)  Final    >=100,000 COLONIES/mL LACTOBACILLUS SPECIES Standardized susceptibility testing for this organism is not available. Performed at [facility], [address] [place], [place] [address]    Report Status 11/29/2022 FINAL  Final  Blood Culture ID Panel (Reflexed)     Status: Abnormal   Collection Time: 11/28/22  8:44 AM  Result Value Ref Range Status   Enterococcus faecalis NOT DETECTED NOT DETECTED Final   Enterococcus Faecium NOT DETECTED NOT DETECTED Final   Listeria monocytogenes NOT DETECTED NOT DETECTED Final   Staphylococcus species NOT DETECTED NOT DETECTED Final   Staphylococcus aureus (BCID) NOT DETECTED NOT DETECTED Final   Staphylococcus epidermidis NOT DETECTED NOT DETECTED Final   Staphylococcus lugdunensis NOT DETECTED NOT DETECTED Final   Streptococcus species NOT DETECTED NOT DETECTED Final   Streptococcus agalactiae NOT DETECTED NOT DETECTED Final   Streptococcus pneumoniae NOT DETECTED NOT DETECTED Final   Streptococcus pyogenes NOT DETECTED NOT DETECTED Final   A.calcoaceticus-baumannii NOT DETECTED NOT DETECTED Final   Bacteroides fragilis NOT DETECTED NOT DETECTED Final   Enterobacterales NOT DETECTED NOT DETECTED Final   Enterobacter cloacae complex NOT DETECTED NOT DETECTED Final   Escherichia coli NOT DETECTED NOT DETECTED Final   Klebsiella aerogenes NOT DETECTED NOT DETECTED Final   Klebsiella oxytoca NOT DETECTED NOT DETECTED Final   Klebsiella pneumoniae NOT DETECTED NOT DETECTED Final   Proteus species NOT DETECTED NOT DETECTED Final   Salmonella species NOT DETECTED NOT DETECTED Final   Serratia marcescens NOT DETECTED NOT DETECTED Final    Haemophilus influenzae NOT DETECTED NOT DETECTED Final   Neisseria meningitidis NOT DETECTED NOT DETECTED Final   Pseudomonas aeruginosa DETECTED (A) NOT DETECTED Final    Comment: CRITICAL RESULT CALLED TO, READ BACK BY AND VERIFIED WITH: L. POINDEXTER PHARMD, AT 1351 11/29/22 D. VANHOOK    Stenotrophomonas maltophilia NOT DETECTED NOT DETECTED Final   Candida albicans  NOT DETECTED NOT DETECTED Final   Candida auris NOT DETECTED NOT DETECTED Final   Candida glabrata NOT DETECTED NOT DETECTED Final   Candida krusei NOT DETECTED NOT DETECTED Final   Candida parapsilosis NOT DETECTED NOT DETECTED Final   Candida tropicalis NOT DETECTED NOT DETECTED Final   Cryptococcus neoformans/gattii NOT DETECTED NOT DETECTED Final   CTX-M ESBL NOT DETECTED NOT DETECTED Final   Carbapenem resistance IMP NOT DETECTED NOT DETECTED Final   Carbapenem resistance KPC NOT DETECTED NOT DETECTED Final   Carbapenem resistance NDM NOT DETECTED NOT DETECTED Final   Carbapenem resistance VIM NOT DETECTED NOT DETECTED Final    Comment: Performed at [facility], [address], [place], [place] [address]  Blood Culture (routine x 2)     Status: None (Preliminary result)   Collection Time: 11/28/22  9:02 AM   Specimen: BLOOD  Result Value Ref Range Status   Specimen Description   Final    BLOOD RIGHT ANTECUBITAL Performed at [facility], [address] [place], [place] [address]    Special Requests   Final    Blood Culture adequate volume BOTTLES DRAWN AEROBIC AND ANAEROBIC Performed at [facility], [address] [place], [place] [address]    Culture   Final    NO GROWTH < 24 HOURS Performed at [facility], [address], [place], [place] [address]    Report Status PENDING  Incomplete         Radiology Studies: IR NEPHROSTOMY EXCHANGE LEFT  Result Date: 11/29/2022 INDICATION: History cervical malignancy with bilateral obstructive uropathy, post right-sided  ureteral stent placement with failed attempt at left-sided ureteral stent placement, initially post left-sided percutaneous nephrostomy catheter placement on 09/03/2021. 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 Patient now admitted with urinary tract infection and request made for left-sided percutaneous nephrostomy catheter exchange. EXAM: FLUOROSCOPIC GUIDED LEFT SIDED NEPHROSTOMY CATHETER EXCHANGE COMPARISON:  Attempted left-sided ureteral stent placement-01/20/2022; 11/02/2021 Image guided left-sided nephrostomy catheter placement-09/03/2021 PET-CT-08/21/2022 CONTRAST:  5 cc Omnipaque 300, administered into the left renal collecting system. FLUOROSCOPY TIME:  49 seconds (5 mGy) COMPLICATIONS: None immediate. TECHNIQUE: Informed written consent was obtained from the patient after a discussion of the risks, benefits and alternatives to treatment. Questions regarding the procedure were encouraged and answered. A timeout was performed prior to the initiation of the procedure. The left flank and external portion of existing nephrostomy catheter were prepped and draped in the usual sterile fashion. A sterile drape was applied covering the operative field. Maximum barrier sterile technique with sterile gowns and gloves were used for the procedure. A timeout was performed prior to the initiation of the procedure. A pre procedural spot fluoroscopic image was obtained after contrast was injected via the existing nephrostomy catheter demonstrating appropriate positioning within the renal pelvis. The existing nephrostomy catheter was cut and cannulated with a Benson wire which was coiled within the renal pelvis. Under intermittent fluoroscopic guidance, the existing nephrostomy catheter was exchanged for a new 10.2  [place] all-purpose drainage catheter. Contrast injection confirmed appropriate positioning within the renal pelvis and a post exchange fluoroscopic image was obtained. The catheter was locked and secured to the skin with a suture. A dressing was applied. The patient tolerated the procedure well without immediate postprocedural complication. FINDINGS:  The existing nephrostomy catheter is appropriately positioned and functioning. Antegrade nephrostogram demonstrate patency of the left ureter to the level of the urinary bladder. 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 After successful fluoroscopic guided exchange, the new nephrostomy catheter is coiled and locked within the left renal pelvis. IMPRESSION: Successful fluoroscopic guided exchange of left sided 10.2 French percutaneous nephrostomy catheter. PLAN: Per patient request, consideration for a final attempt at left-sided ureteral stent placement may be performed at the time of the patient's next fluoroscopic guided exchange though if this attempt is again unsuccessful, the patient will require a chronic left-sided nephrostomy catheter. Electronically Signed   By: Simonne Come M.D.   On: 11/29/2022 10:21   DG Chest Port 1 View  Result Date: 11/28/2022 CLINICAL DATA:  Questionable sepsis evaluate for abnormality. EXAM: PORTABLE CHEST 1 VIEW COMPARISON:  Chest radiograph 09/01/2021.  PET-CT 08/21/2022. FINDINGS: Right chest Port-A-Cath tip projects over the superior cavoatrial junction. Low lung volumes accentuate the pulmonary vasculature and cardiomediastinal silhouette. Unchanged patchy perihilar opacities, accounting for differences in modality. No new airspace disease. No pleural effusion or pneumothorax. Visualized bones and upper abdomen are unremarkable. IMPRESSION: Unchanged patchy perihilar opacities, accounting for  differences in modality. No new airspace disease. Electronically Signed   By: Orvan Falconer M.D.   On: 11/28/2022 09:01           LOS: 2 days   Time spent= 35 mins    [place] Joline Maxcy, MD [facility]  If 7PM-7AM, please contact night-coverage  11/30/2022, 7:39 AM

## 2022-12-01 DIAGNOSIS — N19 Unspecified kidney failure: Secondary | ICD-10-CM | POA: Diagnosis not present

## 2022-12-01 DIAGNOSIS — A419 Sepsis, unspecified organism: Secondary | ICD-10-CM | POA: Diagnosis not present

## 2022-12-01 DIAGNOSIS — N390 Urinary tract infection, site not specified: Secondary | ICD-10-CM | POA: Diagnosis not present

## 2022-12-01 DIAGNOSIS — Z79811 Long term (current) use of aromatase inhibitors: Secondary | ICD-10-CM | POA: Diagnosis not present

## 2022-12-01 DIAGNOSIS — A4152 Sepsis due to Pseudomonas: Secondary | ICD-10-CM | POA: Diagnosis not present

## 2022-12-01 DIAGNOSIS — C55 Malignant neoplasm of uterus, part unspecified: Secondary | ICD-10-CM | POA: Diagnosis not present

## 2022-12-01 LAB — CULTURE, BLOOD (ROUTINE X 2)

## 2022-12-01 LAB — BASIC METABOLIC PANEL
Anion gap: 11 (ref 5–15)
BUN: 78 mg/dL — ABNORMAL HIGH (ref 6–20)
CO2: 16 mmol/L — ABNORMAL LOW (ref 22–32)
Calcium: 7.5 mg/dL — ABNORMAL LOW (ref 8.9–10.3)
Chloride: 102 mmol/L (ref 98–111)
Creatinine, Ser: 4.67 mg/dL — ABNORMAL HIGH (ref 0.44–1.00)
GFR, Estimated: 10 mL/min — ABNORMAL LOW (ref >60)
Glucose, Bld: 256 mg/dL — ABNORMAL HIGH (ref 70–99)
Potassium: 4 mmol/L (ref 3.5–5.1)
Sodium: 129 mmol/L — ABNORMAL LOW (ref 135–145)

## 2022-12-01 LAB — HEPARIN LEVEL (UNFRACTIONATED)
Heparin Unfractionated: 0.78 IU/mL — ABNORMAL HIGH (ref 0.30–0.70)
Heparin Unfractionated: 0.83 IU/mL — ABNORMAL HIGH (ref 0.30–0.70)
Heparin Unfractionated: 0.55 IU/mL (ref 0.30–0.70)

## 2022-12-01 LAB — CBC
HCT: 28.6 % — ABNORMAL LOW (ref 36.0–46.0)
Hemoglobin: 9.6 g/dL — ABNORMAL LOW (ref 12.0–15.0)
MCH: 29.4 pg (ref 26.0–34.0)
MCHC: 33.6 g/dL (ref 30.0–36.0)
MCV: 87.5 fL (ref 80.0–100.0)
Platelets: 151 10*3/uL (ref 150–400)
RBC: 3.27 MIL/uL — ABNORMAL LOW (ref 3.87–5.11)
RDW: 15.3 % (ref 11.5–15.5)
WBC: 3.7 10*3/uL — ABNORMAL LOW (ref 4.0–10.5)
nRBC: 0 % (ref 0.0–0.2)

## 2022-12-01 LAB — MAGNESIUM: Magnesium: 2.3 mg/dL (ref 1.7–2.4)

## 2022-12-01 LAB — GLUCOSE, CAPILLARY
Glucose-Capillary: 122 mg/dL — ABNORMAL HIGH (ref 70–99)
Glucose-Capillary: 171 mg/dL — ABNORMAL HIGH (ref 70–99)
Glucose-Capillary: 173 mg/dL — ABNORMAL HIGH (ref 70–99)
Glucose-Capillary: 183 mg/dL — ABNORMAL HIGH (ref 70–99)
Glucose-Capillary: 200 mg/dL — ABNORMAL HIGH (ref 70–99)
Glucose-Capillary: 230 mg/dL — ABNORMAL HIGH (ref 70–99)

## 2022-12-01 MED ORDER — SODIUM CHLORIDE 0.9 % IV SOLN: INTRAVENOUS | Status: DC

## 2022-12-01 MED ORDER — HEPARIN (PORCINE) 25000 UT/250ML-% IV SOLN
750.0000 [IU]/h | INTRAVENOUS | Status: DC
  Administered 2022-12-01 – 2022-12-03 (×2): 750 [IU]/h via INTRAVENOUS
  Filled 2022-12-01 (×2): qty 250

## 2022-12-01 NOTE — Progress Notes (Signed)
PHARMACY NOTE -  Antibiotic note  Pharmacy has been assisting with dosing of Cefepime for sepsis/bacteremia. Dosage remains stable at Cefepime 2gm IV every 24 hours and need for further dosage adjustment appears unlikely at present.    Will sign off at this time-- following peripherally for culture results, dose adjustments, and length of therapy via software.  Please reconsult if a change in clinical status warrants re-evaluation of dosage.  Tacy Learn, PharmD Clinical Pharmacist 12/01/2022 3:46 PM

## 2022-12-01 NOTE — Progress Notes (Signed)
PROGRESS NOTE    Jaime Gomez  ZOX:[id number] DOB: 20-May-1966 DOA: 11/28/2022 PCP: Staci Acosta, FNP   Brief Narrative:  57 y.o. 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 The patient has had placement of a left sided nephrostomy tube which was due for replacement on 11/29/2022.  Patient admitted for sepsis secondary to urinary tract infection.  IR consulted for nephrostomy tube change.  Patient tolerated the procedure well.  Due to anemia, 2 units PRBC was given with appropriate response.  Hospital course complicated by acute kidney injury which is slowly improving.   Assessment & Plan:  Principal Problem:   Sepsis secondary to UTI [facility]) Active Problems:   Endometrial cancer, FIGO stage IVB (HCC)   Leg DVT (deep venous thromboembolism), chronic, right (HCC)   Acute renal failure superimposed on stage 3a chronic kidney disease (HCC)   Essential (primary) hypertension   Type 2 diabetes mellitus without complications (HCC)   Abnormality of lung on CXR   Metabolic acidosis with normal anion gap and failure of bicarbonate regeneration     Assessment and Plan: * Sepsis secondary to UTI (HCC) Pseudomonas bacteremia Sepsis physiology stable for now.  Plans for nephrostomy tube change by IR 5/8.  Blood cultures growing Pseudomonas, sensitivities pending.  Sensitivities pending.  On cefepime.   Infectious disease recommending Chemo-Port removal, Dr. Myna Hidalgo from oncology currently not agreeable.   Extensively discussed standard of care with the patient and her sister at bedside regarding port management/removal in the setting of bacteremia.  Especially while this port is not being used actively.  Infectious diseases in agreement.  Acute renal failure superimposed on stage 3a chronic  kidney disease (HCC) Baseline creatinine 2.6, admission creatinine 4.9 > 5.15 > 4.81 > 4.67 Continue IV fluids Renal ultrasound-chronic disease  Hypomagnesemia/hypokalemia - As needed repletion   Leg DVT (deep venous thromboembolism), chronic, right (HCC) The patient is on arixtra chronically.  Currently Arixtra is not a good option given renal function.  Reviewed risk of underdosing or overdosing.  For now we will place on heparin drip.   Dr Myna Hidalgo consulted, prefers to use low-dose Arixtra despite renal function.  Recommends low-dose but I am not sure what this low doses as it is not recommended/standard of care at this level of renal function.  Patient and family understands this. If this continues to be oncology recommendation, will defer the dosing to their service.  Metabolic acidosis with normal anion gap and failure of bicarbonate regeneration Secondary to AKI, continue sodium bicarb 3 times daily  Abnormality of lung on CXR The patient has persistent bilateral perihilar patchy opacities on CXR. They have previously been present on CXR of 08/2021 and PET-CT of 08/21/2022. Monitor. No current symptoms of respiratory illness.  Anemia of Chronic Dz Baseline Hb 9.5, slowly hemoglobin drifted down to 7.1.  Routinely gets outpatient transfusion.  Received 2 units PRBC 5/8.  Hemoglobin stable 9.0  Type 2 diabetes mellitus without complications (HCC) Peripheral neuropathy -Sliding scale and Accu-Chek. Levemir 20 units at bedtime Gabapentin  Essential (primary) hypertension On Coreg, can be held for soft blood pressure IV as needed  Chronic CVA - New since February.  Discussed with patient and family regarding conservative management as this is now chronic.    Endometrial cancer, FIGO stage IVB (HCC) On outpatient chemotherapy  DVT prophylaxis:  Heparin drip Code Status: Full Family Communication: Sister present at bedside Status is: Inpatient Ongoing evaluation for urinary  tract infection, anemia and acute kidney injury       Diet Orders (From admission, onward)     Start     Ordered   11/29/22 0956  Diet Carb Modified Fluid consistency: Thin; Room service appropriate? Yes  Diet effective now       Question Answer Comment  Diet-HS Snack? Nothing   Calorie Level Medium 1600-2000   Fluid consistency: Thin   Room service appropriate? Yes      11/29/22 0955            Subjective: Patient seen and examined at bedside.  Yesterday evening had a lot of anxiety and some slurred speech.  CT of the head showed old stroke.  This morning sister is also present at bedside.  I explained them that routinely during cases of bacteremia, infectious diseases suggested port removal especially this is not being in use to give line holiday and later this can be reinserted if necessary. Also discussed anticoagulation with the patient and the sister as we cannot safely use Arixtra at current renal function as recommended by oncology.  This may lead to suboptimal treatment.  Patient and family to discuss with oncology regarding their further exact recommendations.  Examination: Constitutional: Not in acute distress Respiratory: Clear to auscultation bilaterally Cardiovascular: Normal sinus rhythm, no rubs Abdomen: Nontender nondistended good bowel sounds Musculoskeletal: No edema noted Skin: No rashes seen Neurologic: CN 2-12 grossly intact.  And nonfocal Psychiatric: Normal judgment and insight. Alert and oriented x 3. Normal mood.   Chemo-Port in the right chest wall site looks okay Objective: Vitals:   11/30/22 1315 11/30/22 1821 11/30/22 2005 12/01/22 0529  BP: 112/63 131/73 126/74 111/68  Pulse: 75 80 74 60  Resp: 16 17 18 18   Temp: 99 F (37.2 C) 99.8 F (37.7 C) 98.6 F (37 C) 97.7 F (36.5 C)  TempSrc: Oral Oral Oral Oral  SpO2: 98% 98% 98% 99%  Weight:      Height:        Intake/Output Summary (Last 24 hours) at 12/01/2022 0757 Last data filed  at 12/01/2022 0400 Gross per 24 hour  Intake 1510.51 ml  Output 800 ml  Net 710.51 ml   Filed Weights   11/28/22 0819  Weight: 60.8 kg    Scheduled Meds:  acetaminophen  1,000 mg Oral Once   carvedilol  12.5 mg Oral BID WC   Chlorhexidine Gluconate Cloth  6 each Topical Daily   folic acid  1 mg Oral Q0600   gabapentin  100 mg Oral BID   insulin aspart  0-9 Units Subcutaneous TID WC   insulin detemir  20 Units Subcutaneous QHS   pneumococcal 20-valent conjugate vaccine  0.5 mL Intramuscular Tomorrow-1000   sodium bicarbonate  650 mg Oral TID   Continuous Infusions:  ceFEPime (MAXIPIME) IV 2 g (11/30/22 1120)   dextrose 5 % and 0.45% NaCl 50 mL/hr at 12/01/22 0158   heparin 900 Units/hr (12/01/22 0157)    Nutritional status     Body mass index is 22.3 kg/m.  Data Reviewed:   CBC: Recent Labs  Lab 11/28/22 0844 11/29/22 0403 11/30/22 0315 12/01/22 0059  WBC 4.0 3.4* 3.9* 3.7*  NEUTROABS 3.3  --   --   --   HGB 7.3* 7.1* 9.0* 9.6*  HCT 22.1* 22.5* 27.0* 28.6*  MCV 81.3 85.6 87.4 87.5  PLT 120* 122*  125* 151   Basic Metabolic Panel: Recent Labs  Lab 11/28/22 0844 11/29/22 0403 11/30/22 0315 12/01/22 0059  NA 127* 130* 130* 129*  K 3.8 3.4* 3.9 4.0  CL 99 101 103 102  CO2 13* 17* 15* 16*  GLUCOSE 321* 84 115* 256*  BUN 74* 80* 80* 78*  CREATININE 4.94* 5.15* 4.81* 4.67*  CALCIUM 7.8* 8.0* 7.6* 7.5*  MG  --   --  1.5* 2.3   GFR: Estimated Creatinine Clearance: 11.8 mL/min (A) (by C-G formula based on SCr of 4.67 mg/dL (H)). Liver Function Tests: Recent Labs  Lab 11/28/22 0844 11/29/22 0403  AST 23 76*  ALT 8 11  ALKPHOS 100 82  BILITOT 0.4 0.5  PROT 6.5 6.3*  ALBUMIN 2.4* 2.3*   No results for input(s): "LIPASE", "AMYLASE" in the last 168 hours. No results for input(s): "AMMONIA" in the last 168 hours. Coagulation Profile: Recent Labs  Lab 11/28/22 0844 11/30/22 1204  INR 1.2 1.1   Cardiac Enzymes: No results for input(s): "CKTOTAL",  "CKMB", "CKMBINDEX", "TROPONINI" in the last 168 hours. BNP (last 3 results) No results for input(s): "PROBNP" in the last 8760 hours. HbA1C: Recent Labs    11/28/22 1610  HGBA1C 8.3*   CBG: Recent Labs  Lab 11/30/22 1817 11/30/22 1959 12/01/22 0005 12/01/22 0525 12/01/22 0723  GLUCAP 148* 159* 230* 200* 173*   Lipid Profile: No results for input(s): "CHOL", "HDL", "LDLCALC", "TRIG", "CHOLHDL", "LDLDIRECT" in the last 72 hours. Thyroid Function Tests: No results for input(s): "TSH", "T4TOTAL", "FREET4", "T3FREE", "THYROIDAB" in the last 72 hours. Anemia Panel: No results for input(s): "VITAMINB12", "FOLATE", "FERRITIN", "TIBC", "IRON", "RETICCTPCT" in the last 72 hours. Sepsis Labs: Recent Labs  Lab 11/28/22 0844  LATICACIDVEN 1.3    Recent Results (from the past 240 hour(s))  Resp panel by RT-PCR (RSV, Flu A&B, Covid) Anterior Nasal Swab     Status: None   Collection Time: 11/28/22  8:44 AM   Specimen: Anterior Nasal Swab  Result Value Ref Range Status   SARS Coronavirus 2 by RT PCR NEGATIVE NEGATIVE Final    Comment: (NOTE) SARS-CoV-2 target nucleic acids are NOT DETECTED.  The SARS-CoV-2 RNA is generally detectable in upper respiratory specimens during the acute phase of infection. The lowest concentration of SARS-CoV-2 viral copies this assay can detect is 138 copies/mL. A negative result does not preclude SARS-Cov-2 infection and should not be used as the sole basis for treatment or other patient management decisions. A negative result may occur with  improper specimen collection/handling, submission of specimen other than nasopharyngeal swab, presence of viral mutation(s) within the areas targeted by this assay, and inadequate number of viral copies(<138 copies/mL). A negative result must be combined with clinical observations, patient history, and epidemiological information. The expected result is Negative.  Fact Sheet for Patients:   BloggerCourse.com  Fact Sheet for Healthcare Providers:  SeriousBroker.it  This test is no t yet approved or cleared by the [place] FDA and  has been authorized for detection and/or diagnosis of SARS-CoV-2 by FDA under an Emergency Use Authorization (EUA). This EUA will remain  in effect (meaning this test can be used) for the duration of the COVID-19 declaration under Section 564(b)(1) of the Act, 21 U.S.C.section 360bbb-3(b)(1), unless the authorization is terminated  or revoked sooner.       Influenza A by PCR NEGATIVE NEGATIVE Final   Influenza B by PCR NEGATIVE NEGATIVE Final    Comment: (NOTE) The Xpert Xpress SARS-CoV-2/FLU/RSV plus assay  is intended as an aid in the diagnosis of influenza from Nasopharyngeal swab specimens and should not be used as a sole basis for treatment. Nasal washings and aspirates are unacceptable for Xpert Xpress SARS-CoV-2/FLU/RSV testing.  Fact Sheet for Patients: BloggerCourse.com  Fact Sheet for Healthcare Providers: SeriousBroker.it  This test is not yet approved or cleared by the [place] FDA and has been authorized for detection and/or diagnosis of SARS-CoV-2 by FDA under an Emergency Use Authorization (EUA). This EUA will remain in effect (meaning this test can be used) for the duration of the COVID-19 declaration under Section 564(b)(1) of the Act, 21 U.S.C. section 360bbb-3(b)(1), unless the authorization is terminated or revoked.     Resp Syncytial Virus by PCR NEGATIVE NEGATIVE Final    Comment: (NOTE) Fact Sheet for Patients: BloggerCourse.com  Fact Sheet for Healthcare Providers: SeriousBroker.it  This test is not yet approved or cleared by the [place] FDA and has been authorized for detection and/or diagnosis of SARS-CoV-2 by FDA under an Emergency Use  Authorization (EUA). This EUA will remain in effect (meaning this test can be used) for the duration of the COVID-19 declaration under Section 564(b)(1) of the Act, 21 U.S.C. section 360bbb-3(b)(1), unless the authorization is terminated or revoked.  Performed at [facility], [address] [place], [place] [address]   Blood Culture (routine x 2)     Status: Abnormal (Preliminary result)   Collection Time: 11/28/22  8:44 AM   Specimen: BLOOD  Result Value Ref Range Status   Specimen Description   Final    BLOOD BLOOD RIGHT FOREARM Performed at [facility], [address] [place], [place] [address]    Special Requests   Final    Blood Culture results may not be optimal due to an excessive volume of blood received in culture bottles BOTTLES DRAWN AEROBIC AND ANAEROBIC Performed at [facility], [address] [place], [place] [address]    Culture  Setup Time   Final    GRAM NEGATIVE RODS ANAEROBIC BOTTLE ONLY CRITICAL RESULT CALLED TO, READ BACK BY AND VERIFIED WITH: L. POINDEXTER PHARMD, AT 1351 11/29/22 D. VANHOOK    Culture (A)  Final    PSEUDOMONAS AERUGINOSA SUSCEPTIBILITIES TO FOLLOW Performed at [facility], [address] [place] [address], [place], [place] [address]    Report Status PENDING  Incomplete  Urine Culture     Status: Abnormal   Collection Time: 11/28/22  8:44 AM   Specimen: Urine, Random  Result Value Ref Range Status   Specimen Description   Final    URINE, RANDOM Performed at [facility], [address] [place], [place] [address]    Special Requests   Final    NONE Reflexed from [id number] Performed at [facility], [address] [place], [place] [address]    Culture (A)  Final    >=100,000 COLONIES/mL LACTOBACILLUS SPECIES Standardized susceptibility testing for this organism is not available. Performed at [facility], [address], [place], [place] [address]    Report Status 11/29/2022  FINAL  Final  Blood Culture ID Panel (Reflexed)     Status: Abnormal   Collection Time: 11/28/22  8:44 AM  Result Value Ref Range Status   Enterococcus faecalis NOT DETECTED NOT DETECTED Final   Enterococcus Faecium NOT DETECTED NOT DETECTED Final   Listeria monocytogenes NOT DETECTED NOT DETECTED Final   Staphylococcus species NOT DETECTED  NOT DETECTED Final   Staphylococcus aureus (BCID) NOT DETECTED NOT DETECTED Final   Staphylococcus epidermidis NOT DETECTED NOT DETECTED Final   Staphylococcus lugdunensis NOT DETECTED NOT DETECTED Final   Streptococcus species NOT DETECTED NOT DETECTED Final   Streptococcus agalactiae NOT DETECTED NOT DETECTED Final   Streptococcus pneumoniae NOT DETECTED NOT DETECTED Final   Streptococcus pyogenes NOT DETECTED NOT DETECTED Final   A.calcoaceticus-baumannii NOT DETECTED NOT DETECTED Final   Bacteroides fragilis NOT DETECTED NOT DETECTED Final   Enterobacterales NOT DETECTED NOT DETECTED Final   Enterobacter cloacae complex NOT DETECTED NOT DETECTED Final   Escherichia coli NOT DETECTED NOT DETECTED Final   Klebsiella aerogenes NOT DETECTED NOT DETECTED Final   Klebsiella oxytoca NOT DETECTED NOT DETECTED Final   Klebsiella pneumoniae NOT DETECTED NOT DETECTED Final   Proteus species NOT DETECTED NOT DETECTED Final   Salmonella species NOT DETECTED NOT DETECTED Final   Serratia marcescens NOT DETECTED NOT DETECTED Final   Haemophilus influenzae NOT DETECTED NOT DETECTED Final   Neisseria meningitidis NOT DETECTED NOT DETECTED Final   Pseudomonas aeruginosa DETECTED (A) NOT DETECTED Final    Comment: CRITICAL RESULT CALLED TO, READ BACK BY AND VERIFIED WITH: L. POINDEXTER PHARMD, AT 1351 11/29/22 D. VANHOOK    Stenotrophomonas maltophilia NOT DETECTED NOT DETECTED Final   Candida albicans NOT DETECTED NOT DETECTED Final   Candida auris NOT DETECTED NOT DETECTED Final   Candida glabrata NOT DETECTED NOT DETECTED Final   Candida krusei NOT DETECTED  NOT DETECTED Final   Candida parapsilosis NOT DETECTED NOT DETECTED Final   Candida tropicalis NOT DETECTED NOT DETECTED Final   Cryptococcus neoformans/gattii NOT DETECTED NOT DETECTED Final   CTX-M ESBL NOT DETECTED NOT DETECTED Final   Carbapenem resistance IMP NOT DETECTED NOT DETECTED Final   Carbapenem resistance KPC NOT DETECTED NOT DETECTED Final   Carbapenem resistance NDM NOT DETECTED NOT DETECTED Final   Carbapenem resistance VIM NOT DETECTED NOT DETECTED Final    Comment: Performed at [facility], [address], [place], [place] [address]  Blood Culture (routine x 2)     Status: None (Preliminary result)   Collection Time: 11/28/22  9:02 AM   Specimen: BLOOD  Result Value Ref Range Status   Specimen Description   Final    BLOOD RIGHT ANTECUBITAL Performed at [facility], [address] [place], [place] [address]    Special Requests   Final    Blood Culture adequate volume BOTTLES DRAWN AEROBIC AND ANAEROBIC Performed at [facility], [address] [place], [place] [address]    Culture   Final    NO GROWTH 2 DAYS Performed at [facility], [address], [place], [place] [address]    Report Status PENDING  Incomplete  Culture, blood (Routine X 2) w Reflex to ID Panel     Status: None (Preliminary result)   Collection Time: 11/30/22  6:25 PM   Specimen: BLOOD RIGHT ARM  Result Value Ref Range Status   Specimen Description   Final    BLOOD RIGHT ARM Performed at [facility], [address], [place], [place] [address]    Special Requests   Final    BOTTLES DRAWN AEROBIC ONLY Blood Culture adequate volume Performed at [facility], [address], [place], [place] [address]    Culture PENDING  Incomplete   Report Status PENDING  Incomplete  Culture, blood (Routine X 2) w  Reflex to ID Panel     Status: None (Preliminary result)   Collection Time: 11/30/22  6:30 PM   Specimen: BLOOD LEFT HAND  Result  Value Ref Range Status   Specimen Description   Final    BLOOD LEFT HAND Performed at [facility], [address], [place], [place] [address]    Special Requests   Final    BOTTLES DRAWN AEROBIC ONLY Blood Culture adequate volume Performed at [facility], [address], [place], [place] [address]    Culture PENDING  Incomplete   Report Status PENDING  Incomplete         Radiology Studies: CT HEAD WO CONTRAST ( )  Result Date: 11/30/2022 CLINICAL DATA:  Transient ischemic attack EXAM: CT HEAD WITHOUT CONTRAST TECHNIQUE: Contiguous axial images were obtained from the base of the skull through the vertex without intravenous contrast. RADIATION DOSE REDUCTION: This exam was performed according to the departmental dose-optimization program which includes automated exposure control, adjustment of the mA and/or kV according to patient size and/or use of iterative reconstruction technique. COMPARISON:  09/01/2021 FINDINGS: Brain: There is no mass, hemorrhage or extra-axial collection. The size and configuration of the ventricles and extra-axial CSF spaces are normal. 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 Vascular: Atherosclerotic calcification of the vertebral and internal carotid arteries at the skull base. No abnormal hyperdensity of the major intracranial arteries or dural venous sinuses. Skull: The visualized skull base, calvarium and extracranial soft tissues are normal. Sinuses/Orbits: No fluid levels or advanced mucosal thickening of the visualized paranasal sinuses. No mastoid or middle ear effusion. The orbits are normal. IMPRESSION: 1. No acute intracranial abnormality. 2. 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 Electronically Signed   By: Deatra Robinson M.D.   On: 11/30/2022 23:01   US RENAL  Result Date: 11/30/2022 CLINICAL DATA:  [id number] AKI (acute kidney injury) (HCC) [id number]  EXAM: RENAL / URINARY TRACT ULTRASOUND COMPLETE COMPARISON:  CT 09/01/2021 FINDINGS: Right Kidney: Renal measurements: 9.9 x 5.8 x 3.8 cm = volume: 114.5 mL. No hydronephrosis. Increased renal cortical echogenicity. Stent is likely visualized. Left Kidney: Renal measurements: 8.1 x 4.6 x 4.6 cm = volume: 89.6 mL. No hydronephrosis. Increased renal cortical echogenicity. Bladder: Minimal bladder distension with circumferential bladder wall thickening. Other: Limited exam due to bowel gas and body habitus. IMPRESSION: No hydronephrosis. Increased renal cortical echogenicity bilaterally, as can be seen in medical renal disease. Minimal bladder distension with circumferential bladder wall thickening, similar to prior CT, can be seen in cystitis. Correlate with urinalysis. Electronically Signed   By: Caprice Renshaw M.D.   On: 11/30/2022 11:56   IR NEPHROSTOMY EXCHANGE LEFT  Result Date: 11/29/2022 INDICATION: History cervical malignancy with bilateral obstructive uropathy, post right-sided ureteral stent placement with failed attempt at left-sided ureteral stent placement, initially post left-sided percutaneous nephrostomy catheter placement on 09/03/2021. 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 Patient now admitted with urinary tract infection and request made for left-sided percutaneous nephrostomy catheter exchange. EXAM: FLUOROSCOPIC GUIDED LEFT SIDED NEPHROSTOMY CATHETER EXCHANGE COMPARISON:  Attempted left-sided ureteral stent placement-01/20/2022; 11/02/2021 Image guided left-sided nephrostomy catheter placement-09/03/2021 PET-CT-08/21/2022 CONTRAST:  5 cc Omnipaque 300, administered into the left renal collecting system. FLUOROSCOPY TIME:  49 seconds (5 mGy) COMPLICATIONS:  None immediate. TECHNIQUE: Informed  written consent was obtained from the patient after a discussion of the risks, benefits and alternatives to treatment. Questions regarding the procedure were encouraged and answered. A timeout was performed prior to the initiation of the procedure. The left flank and external portion of existing nephrostomy catheter were prepped and draped in the usual sterile fashion. A sterile drape was applied covering the operative field. Maximum barrier sterile technique with sterile gowns and gloves were used for the procedure. A timeout was performed prior to the initiation of the procedure. A pre procedural spot fluoroscopic image was obtained after contrast was injected via the existing nephrostomy catheter demonstrating appropriate positioning within the renal pelvis. The existing nephrostomy catheter was cut and cannulated with a Benson wire which was coiled within the renal pelvis. Under intermittent fluoroscopic guidance, the existing nephrostomy catheter was exchanged for a new 10.2 [place] all-purpose drainage catheter. Contrast injection confirmed appropriate positioning within the renal pelvis and a post exchange fluoroscopic image was obtained. The catheter was locked and secured to the skin with a suture. A dressing was applied. The patient tolerated the procedure well without immediate postprocedural complication. FINDINGS: The existing nephrostomy catheter is appropriately positioned and functioning. Antegrade nephrostogram demonstrate patency of the left ureter to the level of the urinary bladder. 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 After successful fluoroscopic guided exchange, the new nephrostomy catheter is coiled and locked within the left renal pelvis. IMPRESSION: Successful fluoroscopic guided exchange of left sided 10.2 French percutaneous nephrostomy catheter. PLAN:  Per patient request, consideration for a final attempt at left-sided ureteral stent placement may be performed at the time of the patient's next fluoroscopic guided exchange though if this attempt is again unsuccessful, the patient will require a chronic left-sided nephrostomy catheter. Electronically Signed   By: Simonne Come M.D.   On: 11/29/2022 10:21           LOS: 3 days   Time spent= 35 mins    Clarabel Marion Joline Maxcy, MD [facility]  If 7PM-7AM, please contact night-coverage  12/01/2022, 7:57 AM

## 2022-12-01 NOTE — Progress Notes (Addendum)
[facility] Infectious Diseases Follow Up Note  Patient Identification: Patient Name: Jaime Gomez MRN: [id number] Admit Date: 11/28/2022  8:13 AM Age: 57 y.o.Today's Date: 12/01/2022  Reason for Visit: bacteremia   Principal Problem:   Sepsis secondary to UTI [facility]) Active Problems:   Endometrial cancer, FIGO stage IVB (HCC)   Leg DVT (deep venous thromboembolism), chronic, right (HCC)   Acute renal failure superimposed on stage 3a chronic kidney disease (HCC)   Essential (primary) hypertension   Type 2 diabetes mellitus without complications (HCC)   Abnormality of lung on CXR   Metabolic acidosis with normal anion gap and failure of bicarbonate regeneration  Antibiotics:  Cefepime 5/7-c   Lines/Hardware: Rt chest port +   Interval Events: afebrile for more than 24 hrs Labs remarkable for Na 129, Cr up to 4.67, WBC 3.7 CT head with no acute abnormality done for concerns for TIA US kidneys s/o medical renal disease  Assessment 57 year old female with h/o  DM, endometrial cancer on Femara and Afinitor ( follows Dr Jaime Gomez) HTN, kidney stones chronic right leg DVT, anemia of CKD who presented to the ED on 5/7 with persistent fever, generalized bodyaches and weakness, flank pain and weakness  for few days. ID engaged for    # PsA bacteremia: Unclear source but high possibility of portacath being the source although no apparent signs of infection. Per sister, she had a bad infection with another bacteria several years ago     # Urine cx growing Lactobacillus - patient denies GU symptoms whatsoever to me and reports urine was collected from uro bag. So , no indication to treat   # Neutropenia - will monitor on cefepime  Recommendations Continue cefepime as is ( PsA S to cefepime) Fu repeat blood cx  Patient and sister seems to be leaning towards portacath removal with unclear source of bacteremia, esp in the setting portacath has  not been used for a while. I discussed with them if not removed there is a recurrent risk of bacteremia and sepsis Monitor CBC and CMP   Dr Drue Second covering this weekend and will follow cultures. I am back Monday   Rest of the management as per the primary team. Thank you for the consult. Please page with pertinent questions or concerns.  ______________________________________________________________________ Subjective patient seen and examined at the bedside. No complaints. Sister at bedside.   Vitals BP 99/64   Pulse 62   Temp 97.7 F (36.5 C) (Oral)   Resp 18   Ht 5\' 5"  (1.651 m)   Wt 60.8 kg   SpO2 99%   BMI 22.30 kg/m     Physical Exam Constitutional:  adult female lying in the bed and somewhat tearful; after talking to chaplain     Comments:   Cardiovascular:     Rate and Rhythm: Normal rate and regular rhythm.     Heart sounds:   Pulmonary:     Effort: Pulmonary effort is normal.     Comments:   Abdominal:     Palpations: Abdomen is soft.     Tenderness: non distended   Musculoskeletal:        General: No swelling or tenderness in peripheral joints.   Skin:    Comments: no signs of infection in the rt chest portacath   Neurological:     General: awake, alert and oriented, grossly non focal  Psychiatric:        Mood and Affect: Mood normal.   Pertinent Microbiology Results for orders placed  or performed during the hospital encounter of 11/28/22  Resp panel by RT-PCR (RSV, Flu A&B, Covid) Anterior Nasal Swab     Status: None   Collection Time: 11/28/22  8:44 AM   Specimen: Anterior Nasal Swab  Result Value Ref Range Status   SARS Coronavirus 2 by RT PCR NEGATIVE NEGATIVE Final    Comment: (NOTE) SARS-CoV-2 target nucleic acids are NOT DETECTED.  The SARS-CoV-2 RNA is generally detectable in upper respiratory specimens during the acute phase of infection. The lowest concentration of SARS-CoV-2 viral copies this assay can detect is 138 copies/mL. A  negative result does not preclude SARS-Cov-2 infection and should not be used as the sole basis for treatment or other patient management decisions. A negative result may occur with  improper specimen collection/handling, submission of specimen other than nasopharyngeal swab, presence of viral mutation(s) within the areas targeted by this assay, and inadequate number of viral copies(<138 copies/mL). A negative result must be combined with clinical observations, patient history, and epidemiological information. The expected result is Negative.  Fact Sheet for Patients:  BloggerCourse.com  Fact Sheet for Healthcare Providers:  SeriousBroker.it  This test is no t yet approved or cleared by the [place] FDA and  has been authorized for detection and/or diagnosis of SARS-CoV-2 by FDA under an Emergency Use Authorization (EUA). This EUA will remain  in effect (meaning this test can be used) for the duration of the COVID-19 declaration under Section 564(b)(1) of the Act, 21 U.S.C.section 360bbb-3(b)(1), unless the authorization is terminated  or revoked sooner.       Influenza A by PCR NEGATIVE NEGATIVE Final   Influenza B by PCR NEGATIVE NEGATIVE Final    Comment: (NOTE) The Xpert Xpress SARS-CoV-2/FLU/RSV plus assay is intended as an aid in the diagnosis of influenza from Nasopharyngeal swab specimens and should not be used as a sole basis for treatment. Nasal washings and aspirates are unacceptable for Xpert Xpress SARS-CoV-2/FLU/RSV testing.  Fact Sheet for Patients: BloggerCourse.com  Fact Sheet for Healthcare Providers: SeriousBroker.it  This test is not yet approved or cleared by the [place] FDA and has been authorized for detection and/or diagnosis of SARS-CoV-2 by FDA under an Emergency Use Authorization (EUA). This EUA will remain in effect (meaning this test can  be used) for the duration of the COVID-19 declaration under Section 564(b)(1) of the Act, 21 U.S.C. section 360bbb-3(b)(1), unless the authorization is terminated or revoked.     Resp Syncytial Virus by PCR NEGATIVE NEGATIVE Final    Comment: (NOTE) Fact Sheet for Patients: BloggerCourse.com  Fact Sheet for Healthcare Providers: SeriousBroker.it  This test is not yet approved or cleared by the [place] FDA and has been authorized for detection and/or diagnosis of SARS-CoV-2 by FDA under an Emergency Use Authorization (EUA). This EUA will remain in effect (meaning this test can be used) for the duration of the COVID-19 declaration under Section 564(b)(1) of the Act, 21 U.S.C. section 360bbb-3(b)(1), unless the authorization is terminated or revoked.  Performed at [facility], [address] [place], [place] [address]   Blood Culture (routine x 2)     Status: Abnormal   Collection Time: 11/28/22  8:44 AM   Specimen: BLOOD  Result Value Ref Range Status   Specimen Description   Final    BLOOD BLOOD RIGHT FOREARM Performed at [facility], [address] [place], [place] [address]    Special Requests   Final    Blood  Culture results may not be optimal due to an excessive volume of blood received in culture bottles BOTTLES DRAWN AEROBIC AND ANAEROBIC Performed at [facility], [address] [place], [place] [address]    Culture  Setup Time   Final    GRAM NEGATIVE RODS ANAEROBIC BOTTLE ONLY CRITICAL RESULT CALLED TO, READ BACK BY AND VERIFIED WITH: L. Loletta Specter PHARMD, AT 1351 11/29/22 Renato Shin Performed at [facility], [address] [place] [address], [place], [place] [address]    Culture PSEUDOMONAS AERUGINOSA (A)  Final   Report Status 12/01/2022 FINAL  Final   Organism ID, Bacteria PSEUDOMONAS AERUGINOSA  Final      Susceptibility   Pseudomonas aeruginosa - MIC*    CEFTAZIDIME <=1  SENSITIVE Sensitive     CIPROFLOXACIN 0.5 SENSITIVE Sensitive     GENTAMICIN <=1 SENSITIVE Sensitive     IMIPENEM <=0.25 SENSITIVE Sensitive     PIP/TAZO <=4 SENSITIVE Sensitive     CEFEPIME 2 SENSITIVE Sensitive     * PSEUDOMONAS AERUGINOSA  Urine Culture     Status: Abnormal   Collection Time: 11/28/22  8:44 AM   Specimen: Urine, Random  Result Value Ref Range Status   Specimen Description   Final    URINE, RANDOM Performed at [facility], [address] [place], [place] [address]    Special Requests   Final    NONE Reflexed from [id number] Performed at [facility], [address] [place], [place] [address]    Culture (A)  Final    >=100,000 COLONIES/mL LACTOBACILLUS SPECIES Standardized susceptibility testing for this organism is not available. Performed at [facility], [address], [place], [place] [address]    Report Status 11/29/2022 FINAL  Final  Blood Culture ID Panel (Reflexed)     Status: Abnormal   Collection Time: 11/28/22  8:44 AM  Result Value Ref Range Status   Enterococcus faecalis NOT DETECTED NOT DETECTED Final   Enterococcus Faecium NOT DETECTED NOT DETECTED Final   Listeria monocytogenes NOT DETECTED NOT DETECTED Final   Staphylococcus species NOT DETECTED NOT DETECTED Final   Staphylococcus aureus (BCID) NOT DETECTED NOT DETECTED Final   Staphylococcus epidermidis NOT DETECTED NOT DETECTED Final   Staphylococcus lugdunensis NOT DETECTED NOT DETECTED Final   Streptococcus species NOT DETECTED NOT DETECTED Final   Streptococcus agalactiae NOT DETECTED NOT DETECTED Final   Streptococcus pneumoniae NOT DETECTED NOT DETECTED Final   Streptococcus pyogenes NOT DETECTED NOT DETECTED Final   A.calcoaceticus-baumannii NOT DETECTED NOT DETECTED Final   Bacteroides fragilis NOT DETECTED NOT DETECTED Final   Enterobacterales NOT DETECTED NOT DETECTED Final   Enterobacter cloacae complex NOT DETECTED NOT DETECTED Final   Escherichia  coli NOT DETECTED NOT DETECTED Final   Klebsiella aerogenes NOT DETECTED NOT DETECTED Final   Klebsiella oxytoca NOT DETECTED NOT DETECTED Final   Klebsiella pneumoniae NOT DETECTED NOT DETECTED Final   Proteus species NOT DETECTED NOT DETECTED Final   Salmonella species NOT DETECTED NOT DETECTED Final   Serratia marcescens NOT DETECTED NOT DETECTED Final   Haemophilus influenzae NOT DETECTED NOT DETECTED Final   Neisseria meningitidis NOT DETECTED NOT DETECTED Final   Pseudomonas aeruginosa DETECTED (A) NOT DETECTED Final    Comment: CRITICAL RESULT CALLED TO, READ BACK BY AND VERIFIED WITH: L. POINDEXTER PHARMD, AT 1351 11/29/22 D. VANHOOK    Stenotrophomonas maltophilia NOT DETECTED NOT DETECTED Final   Candida albicans NOT DETECTED NOT DETECTED Final  Candida auris NOT DETECTED NOT DETECTED Final   Candida glabrata NOT DETECTED NOT DETECTED Final   Candida krusei NOT DETECTED NOT DETECTED Final   Candida parapsilosis NOT DETECTED NOT DETECTED Final   Candida tropicalis NOT DETECTED NOT DETECTED Final   Cryptococcus neoformans/gattii NOT DETECTED NOT DETECTED Final   CTX-M ESBL NOT DETECTED NOT DETECTED Final   Carbapenem resistance IMP NOT DETECTED NOT DETECTED Final   Carbapenem resistance KPC NOT DETECTED NOT DETECTED Final   Carbapenem resistance NDM NOT DETECTED NOT DETECTED Final   Carbapenem resistance VIM NOT DETECTED NOT DETECTED Final    Comment: Performed at [facility], [address], [place], [place] [address]  Blood Culture (routine x 2)     Status: None (Preliminary result)   Collection Time: 11/28/22  9:02 AM   Specimen: BLOOD  Result Value Ref Range Status   Specimen Description   Final    BLOOD RIGHT ANTECUBITAL Performed at [facility], [address] [place], [place] [address]    Special Requests   Final    Blood Culture adequate volume BOTTLES DRAWN AEROBIC AND ANAEROBIC Performed at [facility], [address] [place], [place] [address]    Culture   Final    NO GROWTH 3 DAYS Performed at [facility], [address] [place], [place] [address]    Report Status PENDING  Incomplete  Culture, blood (Routine X 2) w Reflex to ID Panel     Status: None (Preliminary result)   Collection Time: 11/30/22  6:25 PM   Specimen: BLOOD RIGHT ARM  Result Value Ref Range Status   Specimen Description   Final    BLOOD RIGHT ARM Performed at [facility], [address], [place], [place] [address]    Special Requests   Final    BOTTLES DRAWN AEROBIC ONLY Blood Culture adequate volume Performed at [facility], [address] [place], [place] [address]    Culture   Final    NO GROWTH < 12 HOURS Performed at [facility], [address], [place], [place] [address]    Report Status PENDING  Incomplete  Culture, blood (Routine X 2) w Reflex to ID Panel     Status: None (Preliminary result)   Collection Time: 11/30/22  6:30 PM   Specimen: BLOOD LEFT HAND  Result Value Ref Range Status   Specimen Description   Final    BLOOD LEFT HAND Performed at [facility], [address] [place], [place] [address]    Special Requests   Final    BOTTLES DRAWN AEROBIC ONLY Blood Culture adequate volume Performed at [facility], [address] [place], [place] [address]    Culture   Final    NO GROWTH < 12 HOURS Performed at [facility], [address], [place], [place] [address]    Report Status PENDING  Incomplete   Pertinent Lab.    Latest Ref Rng & Units 12/01/2022   12:59 AM 11/30/2022    3:15 AM 11/29/2022    4:03 AM  CBC  WBC 4.0 - 10.5 K/uL 3.7  3.9  3.4   Hemoglobin 12.0 - 15.0 g/dL 9.6  9.0  7.1   Hematocrit 36.0 - 46.0 % 28.6  27.0  22.5   Platelets 150 - 400 K/uL 151  125  122       Latest Ref Rng &  Units 12/01/2022   12:59 AM 11/30/2022    3:15 AM 11/29/2022    4:03 AM  CMP  Glucose 70 - 99 mg/dL 161  096  84   BUN 6 - 20 mg/dL 78  80   80   Creatinine 0.44 - 1.00 mg/dL 0.45  4.09  8.11   Sodium 135 - 145 mmol/L 129  130  130   Potassium 3.5 - 5.1 mmol/L 4.0  3.9  3.4   Chloride 98 - 111 mmol/L 102  103  101   CO2 22 - 32 mmol/L 16  15  17    Calcium 8.9 - 10.3 mg/dL 7.5  7.6  8.0   Total Protein 6.5 - 8.1 g/dL   6.3   Total Bilirubin 0.3 - 1.2 mg/dL   0.5   Alkaline Phos 38 - 126 U/L   82   AST 15 - 41 U/L   76   ALT 0 - 44 U/L   11      Pertinent Imaging today Plain films and CT images have been personally visualized and interpreted; radiology reports have been reviewed. Decision making incorporated into the Impression   CT HEAD WO CONTRAST ( )  Result Date: 11/30/2022 CLINICAL DATA:  Transient ischemic attack EXAM: CT HEAD WITHOUT CONTRAST TECHNIQUE: Contiguous axial images were obtained from the base of the skull through the vertex without intravenous contrast. RADIATION DOSE REDUCTION: This exam was performed according to the departmental dose-optimization program which includes automated exposure control, adjustment of the mA and/or kV according to patient size and/or use of iterative reconstruction technique. COMPARISON:  09/01/2021 FINDINGS: Brain: There is no mass, hemorrhage or extra-axial collection. The size and configuration of the ventricles and extra-axial CSF spaces are normal. 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 Vascular: Atherosclerotic calcification of the vertebral and internal carotid arteries at the skull base. No abnormal hyperdensity of the major intracranial arteries or dural venous sinuses. Skull: The visualized skull base, calvarium and extracranial soft tissues are normal. Sinuses/Orbits: No fluid levels or advanced mucosal thickening of the visualized paranasal sinuses. No mastoid or middle ear effusion. The orbits are normal. IMPRESSION: 1. No acute intracranial abnormality. 2. 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 Electronically Signed   By: Deatra Robinson M.D.   On: 11/30/2022 23:01     I have personally spent 56 minutes involved in face-to-face and non-face-to-face activities for this patient on the day of the visit. Professional time spent includes the following activities: Preparing to see the patient (review of tests), Obtaining and/or reviewing separately obtained history (admission/discharge record), Performing a medically appropriate examination and/or evaluation , Ordering medications/tests/procedures, referring and communicating with other health care professionals, Documenting clinical information in the EMR, Independently interpreting results (not separately reported), Communicating results to the patient/family/caregiver, Counseling and educating the patient/family/caregiver and Care coordination (not separately reported).   Plan d/w requesting provider as well as ID pharm D  Note: This document was prepared using dragon voice recognition software and may include unintentional dictation errors.   Electronically signed by:   Odette Fraction, MD Infectious Disease Physician [facility] Pager: [phone]

## 2022-12-01 NOTE — Progress Notes (Signed)
[name] is feeling better.  I am sure this is all secondary to her Pseudomonas being treated.  She is on Maxipime.  I have to believe this is working.  She is also on heparin for anticoagulation.  Again, has not outpatient, she has never had a problem with the Arixtra.  Maybe we can just decrease the dose a little bit.  She progressed on oral anticoagulation.  Her appetite is doing a bit better.  She is having no problems with nausea or vomiting.  There is no complaints of pain.  She is out of bed.  Her blood sugars are still on the high side.  This morning, her blood sugar is 256.  Her BUN is 78 creatinine 4.67.  Calcium is 7.5.  Her white cell count is 3.7.  Hemoglobin 9.6.  Platelet count 151,000.  Her vital signs show temperature 97.7.  Pulse 60.  Blood pressure 111/68.  Head and neck exam shows no scleral icterus.  There is no oral lesions.  She has no adenopathy in the neck.  Lungs are clear bilaterally.  She has good air movement bilaterally.  Cardiac exam regular rate and rhythm.  She has no murmurs, rubs or bruits.  Abdomen is soft.  She has good bowel sounds.  There is no guarding or rebound tenderness.  She has no obvious fluid wave.  There is no palpable hepatosplenomegaly.  Extremities shows some trace edema in her legs.  Skin exam shows no rashes.  Neurological exam is nonfocal.   [name] has the chronic renal failure.  She has Pseudomonas bacteremia.  She has lactobacillus in her urine.  She did have a nephrostomy tube changed.  I think we will be important to see what the Pseudomonas is sensitive to with respect to antibiotics.  I think the Maxipime is working quite well.  She feels better.  As far as her cancer is concerned, I know she was taking everolimus/Femara.  She has been doing quite well with this.  I want to do a PET scan on her to see how everything was looking.  I am unsure when this has been scheduled.  I am just happy that she is recovering from this  bacteremia.  I do appreciate the incredible care that she is getting from everybody up on [address].  I know the staff really do a wonderful job.  Happy Nurses Week!!!!!!    [name], MD  Franky Macho 1:37

## 2022-12-01 NOTE — Progress Notes (Signed)
ANTICOAGULATION CONSULT NOTE   Pharmacy Consult for Heparin Indication: history DVT  Allergies  Allergen Reactions   Taxotere [Docetaxel] Swelling    Throat swelling    Patient Measurements: Height: 5\' 5"  (165.1 cm) Weight: 60.8 kg (134 lb) IBW/kg (Calculated) : 57 Heparin Dosing Weight: 60.8 kg  Vital Signs: Temp: 97.7 F (36.5 C) (05/10 0529) Temp Source: Oral (05/10 0529) BP: 99/64 (05/10 0903) Pulse Rate: 62 (05/10 0903)  Labs: Recent Labs    11/29/22 0403 11/30/22 0315 11/30/22 1204 12/01/22 0059  HGB 7.1* 9.0*  --  9.6*  HCT 22.5* 27.0*  --  28.6*  PLT 122* 125*  --  151  APTT  --   --  41*  --   LABPROT  --   --  14.8  --   INR  --   --  1.1  --   HEPARINUNFRC  --   --   --  0.78*  CREATININE 5.15* 4.81*  --  4.67*     Estimated Creatinine Clearance: 11.8 mL/min (A) (by C-G formula based on SCr of 4.67 mg/dL (H)).   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 Last dose per patient 5/6 in morning.  Assessment: Patient is an 57 y.o female admitted on 5/7 for sepsis secondary to urinary tract infection.  Now with Pseudomonas bacteremia and AKI on CKD.  On 5/8 nephrostomy exchanged by IR.  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        Today, 12/01/22 At 0930 Heparin level = 0.83, supra-therapeutic, on heparin gtt @ 900 units/hr Hgb = 9.6, Plt 151K No bleeding reported per RN  Goal of Therapy:  Heparin level 0.3-0.7 units/ml Monitor platelets by anticoagulation protocol: Yes   Plan:  - Decrease Heparin IV to 750 units/hr  - Check  heparin level 8 hr after heparin rate reduced - Daily heparin level and CBC - Monitor for signs/symptoms of bleeding   Josefa Half, PharmD 12/01/2022,10:02 AM

## 2022-12-01 NOTE — Progress Notes (Signed)
   12/01/22 1200  Spiritual Encounters  Type of Visit Initial  Care provided to: Patient;Pt and family  Referral source Nurse (RN/NT/LPN);Chaplain team;Chaplain assessment  Reason for visit Urgent spiritual support  OnCall Visit No  Spiritual Framework  Presenting Themes Impactful experiences and emotions;Coping tools;Significant life change;Values and beliefs;Goals in life/care;Meaning/purpose/sources of inspiration;Courage hope and growth;Rituals and practive  Community/Connection Family;Friend(s);Faith community  Patient Stress Factors Exhausted;Family relationships;Loss of control;Major life changes  Family Stress Factors Loss of control;Loss;Major life changes  Interventions  Spiritual Care Interventions Made Established relationship of care and support;Compassionate presence;Reflective listening;Reconciliation with self/others;Decision-making support/facilitation;Explored ethical dilemma   Met weith patient and her sister at bedside - introduced spiritual care and provided counsel and prayer and support.  When Sister left room - patient expressed her desire to express her goals with her family and their emotional response to her feelings of wanting more palliative care.  She wept and expressed her emotions.  Chaplain provided sipport for her expression.  At her request - prayeed with patient.  Ended visit with a departing blessing and met with MD in hallway following visit.

## 2022-12-01 NOTE — Progress Notes (Signed)
ANTICOAGULATION CONSULT NOTE   Pharmacy Consult for Heparin Indication: history DVT  Allergies  Allergen Reactions   Taxotere [Docetaxel] Swelling    Throat swelling    Patient Measurements: Height: 5\' 5"  (165.1 cm) Weight: 60.8 kg (134 lb) IBW/kg (Calculated) : 57 Heparin Dosing Weight: 60.8 kg  Vital Signs: Temp: 98.4 F (36.9 C) (05/10 2016) Temp Source: Oral (05/10 2016) BP: 123/65 (05/10 2016) Pulse Rate: 75 (05/10 2016)  Labs: Recent Labs    11/29/22 0403 11/30/22 0315 11/30/22 1204 12/01/22 0059 12/01/22 0930 12/01/22 2126  HGB 7.1* 9.0*  --  9.6*  --   --   HCT 22.5* 27.0*  --  28.6*  --   --   PLT 122* 125*  --  151  --   --   APTT  --   --  41*  --   --   --   LABPROT  --   --  14.8  --   --   --   INR  --   --  1.1  --   --   --   HEPARINUNFRC  --   --   --  0.78* 0.83* 0.55  CREATININE 5.15* 4.81*  --  4.67*  --   --      Estimated Creatinine Clearance: 11.8 mL/min (A) (by C-G formula based on SCr of 4.67 mg/dL (H)).   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 Last dose per patient 5/6 in morning.  Assessment: Patient is an 57 y.o female admitted on 5/7 for sepsis secondary to urinary tract infection.  Now with Pseudomonas bacteremia and AKI on CKD.  On 5/8 nephrostomy exchanged by IR.  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        Today, 12/01/22 21:25 Heparin level = 0.55 (therapeutic) on heparin gtt @ 750 units/hr Hgb = 9.6, Plt 151K No complications of therapy noted  Goal of Therapy:  Heparin level 0.3-0.7  units/ml Monitor platelets by anticoagulation protocol: Yes   Plan:  - Continue Heparin gtt @ 750 units/hr  - Heparin level with AM labs to confirm therapeutic dose - Daily heparin level and CBC - Monitor for signs/symptoms of bleeding   Suyash Amory, Joselyn Glassman, PharmD 12/01/2022,10:30 PM

## 2022-12-01 NOTE — Plan of Care (Signed)
  Problem: Activity: Goal: Risk for activity intolerance will decrease Outcome: Progressing   Problem: Elimination: Goal: Will not experience complications related to bowel motility Outcome: Progressing Goal: Will not experience complications related to urinary retention Outcome: Progressing   Problem: Pain Managment: Goal: General experience of comfort will improve Outcome: Progressing   

## 2022-12-01 NOTE — Progress Notes (Addendum)
ANTICOAGULATION CONSULT NOTE - Initial Consult  Pharmacy Consult for Heparin Indication: history DVT  Allergies  Allergen Reactions   Taxotere [Docetaxel] Swelling    Throat swelling    Patient Measurements: Height: 5\' 5"  (165.1 cm) Weight: 60.8 kg (134 lb) IBW/kg (Calculated) : 57 Heparin Dosing Weight: 60.8 kg  Vital Signs: Temp: 98.6 F (37 C) (05/09 2005) Temp Source: Oral (05/09 2005) BP: 126/74 (05/09 2005) Pulse Rate: 74 (05/09 2005)  Labs: Recent Labs    11/28/22 0844 11/29/22 0403 11/30/22 0315 11/30/22 1204 12/01/22 0059  HGB 7.3* 7.1* 9.0*  --  9.6*  HCT 22.1* 22.5* 27.0*  --  28.6*  PLT 120* 122* 125*  --  151  APTT 49*  --   --  41*  --   LABPROT 14.7  --   --  14.8  --   INR 1.2  --   --  1.1  --   HEPARINUNFRC  --   --   --   --  0.78*  CREATININE 4.94* 5.15* 4.81*  --  4.67*     Estimated Creatinine Clearance: 11.8 mL/min (A) (by C-G formula based on SCr of 4.67 mg/dL (H)).   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 Last dose per patient 5/6 in morning.  Assessment: Patient is an 57 y.o female admitted on 5/7 for sepsis secondary to urinary tract infection.  Now with Pseudomonas bacteremia and AKI on CKD.  On 5/8 nephrostomy exchanged by IR.  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        12/01/22 Heparin level = 0.78 (supratherapeutic) with heparin gtt @ 1000 units/hr Hgb = 9.6, PLTC 151K No compliations of therapy noted  Goal of Therapy:  Heparin level 0.3-0.7 units/ml Monitor platelets by anticoagulation protocol:  Yes   Plan:  Decrease heparin infusion to 900 units/hr Check heparin level 8 hr after heparin rate reduced Daily heparin level and CBC Monitor for signs/symptoms of bleeding   Emilynn Srinivasan, Joselyn Glassman, PharmD 12/01/2022,1:54 AM

## 2022-12-02 DIAGNOSIS — F321 Major depressive disorder, single episode, moderate: Secondary | ICD-10-CM | POA: Diagnosis present

## 2022-12-02 DIAGNOSIS — A419 Sepsis, unspecified organism: Secondary | ICD-10-CM | POA: Diagnosis not present

## 2022-12-02 DIAGNOSIS — N390 Urinary tract infection, site not specified: Secondary | ICD-10-CM | POA: Diagnosis not present

## 2022-12-02 LAB — CBC WITH DIFFERENTIAL/PLATELET
Abs Immature Granulocytes: 0.1 10*3/uL — ABNORMAL HIGH (ref 0.00–0.07)
Basophils Absolute: 0 10*3/uL (ref 0.0–0.1)
Basophils Relative: 0 %
Eosinophils Absolute: 0.1 10*3/uL (ref 0.0–0.5)
Eosinophils Relative: 2 %
HCT: 30 % — ABNORMAL LOW (ref 36.0–46.0)
Hemoglobin: 9.8 g/dL — ABNORMAL LOW (ref 12.0–15.0)
Immature Granulocytes: 2 %
Lymphocytes Relative: 8 %
Lymphs Abs: 0.5 10*3/uL — ABNORMAL LOW (ref 0.7–4.0)
MCH: 28.6 pg (ref 26.0–34.0)
MCHC: 32.7 g/dL (ref 30.0–36.0)
MCV: 87.5 fL (ref 80.0–100.0)
Monocytes Absolute: 0.6 10*3/uL (ref 0.1–1.0)
Monocytes Relative: 9 %
Neutro Abs: 4.6 10*3/uL (ref 1.7–7.7)
Neutrophils Relative %: 79 %
Platelets: 195 10*3/uL (ref 150–400)
RBC: 3.43 MIL/uL — ABNORMAL LOW (ref 3.87–5.11)
RDW: 15.8 % — ABNORMAL HIGH (ref 11.5–15.5)
WBC: 5.9 10*3/uL (ref 4.0–10.5)
nRBC: 0 % (ref 0.0–0.2)

## 2022-12-02 LAB — CULTURE, BLOOD (ROUTINE X 2)
Culture: NO GROWTH
Special Requests: ADEQUATE

## 2022-12-02 LAB — HEPARIN LEVEL (UNFRACTIONATED): Heparin Unfractionated: 0.51 IU/mL (ref 0.30–0.70)

## 2022-12-02 LAB — BASIC METABOLIC PANEL
Anion gap: 14 (ref 5–15)
BUN: 80 mg/dL — ABNORMAL HIGH (ref 6–20)
CO2: 14 mmol/L — ABNORMAL LOW (ref 22–32)
Calcium: 8 mg/dL — ABNORMAL LOW (ref 8.9–10.3)
Chloride: 109 mmol/L (ref 98–111)
Creatinine, Ser: 4.32 mg/dL — ABNORMAL HIGH (ref 0.44–1.00)
GFR, Estimated: 11 mL/min — ABNORMAL LOW (ref >60)
Glucose, Bld: 59 mg/dL — ABNORMAL LOW (ref 70–99)
Potassium: 3.4 mmol/L — ABNORMAL LOW (ref 3.5–5.1)
Sodium: 137 mmol/L (ref 135–145)

## 2022-12-02 LAB — GLUCOSE, CAPILLARY
Glucose-Capillary: 49 mg/dL — ABNORMAL LOW (ref 70–99)
Glucose-Capillary: 69 mg/dL — ABNORMAL LOW (ref 70–99)
Glucose-Capillary: 73 mg/dL (ref 70–99)
Glucose-Capillary: 83 mg/dL (ref 70–99)
Glucose-Capillary: 121 mg/dL — ABNORMAL HIGH (ref 70–99)
Glucose-Capillary: 114 mg/dL — ABNORMAL HIGH (ref 70–99)
Glucose-Capillary: 90 mg/dL (ref 70–99)

## 2022-12-02 LAB — MAGNESIUM: Magnesium: 2.1 mg/dL (ref 1.7–2.4)

## 2022-12-02 MED ORDER — ALPRAZOLAM 0.25 MG PO TABS
0.2500 mg | ORAL_TABLET | ORAL | Status: AC | PRN
  Administered 2022-12-02: 0.25 mg via ORAL
  Filled 2022-12-02: qty 1

## 2022-12-02 MED ORDER — INSULIN DETEMIR 100 UNIT/ML ~~LOC~~ SOLN
5.0000 [IU] | Freq: Every day | SUBCUTANEOUS | Status: DC
  Filled 2022-12-02: qty 0.05

## 2022-12-02 MED ORDER — MIRTAZAPINE 15 MG PO TABS
7.5000 mg | ORAL_TABLET | Freq: Every day | ORAL | Status: DC
  Administered 2022-12-02 – 2022-12-11 (×7): 7.5 mg via ORAL
  Filled 2022-12-02 (×7): qty 1

## 2022-12-02 MED ORDER — INSULIN DETEMIR 100 UNIT/ML ~~LOC~~ SOLN: 15.0000 [IU] | Freq: Every day | SUBCUTANEOUS | Status: DC

## 2022-12-02 NOTE — Progress Notes (Signed)
This Clinical research associate and charge nurse Abby have been in the room several times and spoken with family, sister, at bedside. Sister was concerned because of the significant change in her motor abilities. Lots of jerking when she attempts to stand, can not get her to bear weight at all to get to [facility]. And also can not get her to follow commands to suck out of a straw. Spoke with Dr Nelson Chimes and relayed this information as well throughout the day with her changes she has had. He stated to hold po medications for now. Pt is alert and crying in pain at times, but will calm down with family at bedside.

## 2022-12-02 NOTE — Progress Notes (Signed)
PROGRESS NOTE    Jaime Gomez  EAV:[id number] DOB: 07-Nov-1965 DOA: 11/28/2022 PCP: Staci Acosta, FNP   Brief Narrative:  57 y.o. 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 The patient has had placement of a left sided nephrostomy tube which was due for replacement on 11/29/2022.  Patient admitted for sepsis secondary to urinary tract infection.  IR consulted for nephrostomy tube change.  Patient tolerated the procedure well.  Due to anemia, 2 units PRBC was given with appropriate response.  Hospital course complicated by acute kidney injury which is slowly improving.   Assessment & Plan:  Principal Problem:   Sepsis secondary to UTI [facility]) Active Problems:   Endometrial cancer, FIGO stage IVB (HCC)   Leg DVT (deep venous thromboembolism), chronic, right (HCC)   Acute renal failure superimposed on stage 3a chronic kidney disease (HCC)   Essential (primary) hypertension   Type 2 diabetes mellitus without complications (HCC)   Abnormality of lung on CXR   Metabolic acidosis with normal anion gap and failure of bicarbonate regeneration     Assessment and Plan: * Sepsis secondary to UTI (HCC) Pseudomonas bacteremia Sepsis physiology stable for now.  Plans for nephrostomy tube change by IR 5/8.  Blood cultures growing Pseudomonas, sensitivities pending.  Sensitivities pending.  On cefepime.   Infectious disease recommending Chemo-Port removal, Dr. Myna Hidalgo and patient/family no agreeable.   Extensively discussed standard of care with the patient and her sister at bedside regarding port management/removal in the setting of bacteremia.  Especially while this port is not being used actively.  Infectious diseases in agreement.  Acute renal failure superimposed on stage 3a chronic  kidney disease (HCC) Baseline creatinine 2.6, admission creatinine 4.9 > 5.15 > 4.81 > 4.67 > 4.3 Continue IV fluids Renal ultrasound-chronic disease  Hypomagnesemia/hypokalemia - As needed repletion   Leg DVT (deep venous thromboembolism), chronic, right (HCC) The patient is on arixtra chronically.  Currently Arixtra is not a good option given renal function.  Reviewed risk of underdosing or overdosing. Hep drip, as renal fx continues to improve.   Dr Myna Hidalgo consulted, prefers to use low-dose Arixtra despite renal function.  Recommends low-dose but I am not sure what this low doses as it is not recommended/standard of care at this level of renal function.  Patient and family understands this. If this continues to be oncology recommendation, will defer the dosing to their service.  Depression - Suspect underlying depression with multiple medical issues.  Will consult psychiatry, for now will start bedtime mirtazapine but can be changed per psychiatry discretion  Metabolic acidosis with normal anion gap and failure of bicarbonate regeneration Secondary to AKI, continue sodium bicarb 3 times daily  Abnormality of lung on CXR The patient has persistent bilateral perihilar patchy opacities on CXR. They have previously been present on CXR of 08/2021 and PET-CT of 08/21/2022. Monitor. No current symptoms of respiratory illness.  Anemia of Chronic Dz Baseline Hb 9.5, slowly hemoglobin drifted down to 7.1.  Routinely gets outpatient transfusion.  Received 2 units PRBC 5/8.  Hemoglobin stable 9.0  Type 2 diabetes mellitus without complications (HCC) Peripheral neuropathy -Sliding scale and Accu-Chek. Reduce Levemir 15 units at bedtime due to low BS Gabapentin  Essential (primary) hypertension On Coreg, can be held for soft blood pressure IV as needed  Chronic CVA -  New since February.  Discussed with patient and family regarding conservative management as this is now  chronic.    Endometrial cancer, FIGO stage IVB (HCC) On outpatient chemotherapy  DVT prophylaxis: Heparin drip Code Status: Full Family Communication: Sister present at bedside Status is: Inpatient Ongoing evaluation for urinary tract infection, anemia and acute kidney injury       Diet Orders (From admission, onward)     Start     Ordered   11/29/22 0956  Diet Carb Modified Fluid consistency: Thin; Room service appropriate? Yes  Diet effective now       Question Answer Comment  Diet-HS Snack? Nothing   Calorie Level Medium 1600-2000   Fluid consistency: Thin   Room service appropriate? Yes      11/29/22 0955            Subjective: Seen and examined at bedside.  Patient is tearful.  Sister is present at bedside.  Tells me that patient is frequently speaking about giving up all of her medical treatment and sporadically becomes very tearful and this has become more frequent over the last several weeks. Patient and family had discussion with Dr. Myna Hidalgo yesterday and family tells me that at this time patient would like to hold off on port removal  Examination: Constitutional: Not in acute distress Respiratory: Clear to auscultation bilaterally Cardiovascular: Normal sinus rhythm, no rubs Abdomen: Nontender nondistended good bowel sounds Musculoskeletal: No edema noted Skin: No rashes seen Neurologic: CN 2-12 grossly intact.  And nonfocal Psychiatric: Normal judgment and insight.  Very tearful  Chemo-Port in the right chest wall site looks okay Objective: Vitals:   12/01/22 0903 12/01/22 1621 12/01/22 1841 12/01/22 2016  BP: 99/64 102/60 125/72 123/65  Pulse: 62 65 75 75  Resp:  17  19  Temp:  98.3 F (36.8 C)  98.4 F (36.9 C)  TempSrc:  Oral  Oral  SpO2:  100%  100%  Weight:      Height:        Intake/Output Summary (Last 24 hours) at 12/02/2022 0754 Last data filed at 12/02/2022 0502 Gross per 24 hour  Intake 2574.54 ml  Output 725 ml  Net 1849.54 ml    Filed Weights   11/28/22 0819  Weight: 60.8 kg    Scheduled Meds:  acetaminophen  1,000 mg Oral Once   carvedilol  12.5 mg Oral BID WC   Chlorhexidine Gluconate Cloth  6 each Topical Daily   folic acid  1 mg Oral Q0600   gabapentin  100 mg Oral BID   insulin aspart  0-9 Units Subcutaneous TID WC   insulin detemir  20 Units Subcutaneous QHS   sodium bicarbonate  650 mg Oral TID   Continuous Infusions:  sodium chloride 75 mL/hr at 12/01/22 2151   ceFEPime (MAXIPIME) IV 2 g (12/01/22 0916)   heparin 750 Units/hr (12/01/22 1508)    Nutritional status     Body mass index is 22.3 kg/m.  Data Reviewed:   CBC: Recent Labs  Lab 11/28/22 0844 11/29/22 0403 11/30/22 0315 12/01/22 0059 12/02/22 0416  WBC 4.0 3.4* 3.9* 3.7* 5.9  NEUTROABS 3.3  --   --   --  4.6  HGB 7.3* 7.1* 9.0* 9.6* 9.8*  HCT 22.1* 22.5* 27.0* 28.6* 30.0*  MCV 81.3 85.6 87.4 87.5 87.5  PLT 120* 122* 125* 151 195   Basic Metabolic Panel: Recent Labs  Lab 11/28/22 0844 11/29/22 0403 11/30/22 0315 12/01/22 0059 12/02/22 0416  NA 127* 130*  130* 129* 137  K 3.8 3.4* 3.9 4.0 3.4*  CL 99 101 103 102 109  CO2 13* 17* 15* 16* 14*  GLUCOSE 321* 84 115* 256* 59*  BUN 74* 80* 80* 78* 80*  CREATININE 4.94* 5.15* 4.81* 4.67* 4.32*  CALCIUM 7.8* 8.0* 7.6* 7.5* 8.0*  MG  --   --  1.5* 2.3 2.1   GFR: Estimated Creatinine Clearance: 12.8 mL/min (A) (by C-G formula based on SCr of 4.32 mg/dL (H)). Liver Function Tests: Recent Labs  Lab 11/28/22 0844 11/29/22 0403  AST 23 76*  ALT 8 11  ALKPHOS 100 82  BILITOT 0.4 0.5  PROT 6.5 6.3*  ALBUMIN 2.4* 2.3*   No results for input(s): "LIPASE", "AMYLASE" in the last 168 hours. No results for input(s): "AMMONIA" in the last 168 hours. Coagulation Profile: Recent Labs  Lab 11/28/22 0844 11/30/22 1204  INR 1.2 1.1   Cardiac Enzymes: No results for input(s): "CKTOTAL", "CKMB", "CKMBINDEX", "TROPONINI" in the last 168 hours. BNP (last 3 results) No  results for input(s): "PROBNP" in the last 8760 hours. HbA1C: No results for input(s): "HGBA1C" in the last 72 hours.  CBG: Recent Labs  Lab 12/01/22 1828 12/01/22 2057 12/02/22 0531 12/02/22 0612 12/02/22 0632  GLUCAP 171* 183* 49* 69* 73   Lipid Profile: No results for input(s): "CHOL", "HDL", "LDLCALC", "TRIG", "CHOLHDL", "LDLDIRECT" in the last 72 hours. Thyroid Function Tests: No results for input(s): "TSH", "T4TOTAL", "FREET4", "T3FREE", "THYROIDAB" in the last 72 hours. Anemia Panel: No results for input(s): "VITAMINB12", "FOLATE", "FERRITIN", "TIBC", "IRON", "RETICCTPCT" in the last 72 hours. Sepsis Labs: Recent Labs  Lab 11/28/22 0844  LATICACIDVEN 1.3    Recent Results (from the past 240 hour(s))  Resp panel by RT-PCR (RSV, Flu A&B, Covid) Anterior Nasal Swab     Status: None   Collection Time: 11/28/22  8:44 AM   Specimen: Anterior Nasal Swab  Result Value Ref Range Status   SARS Coronavirus 2 by RT PCR NEGATIVE NEGATIVE Final    Comment: (NOTE) SARS-CoV-2 target nucleic acids are NOT DETECTED.  The SARS-CoV-2 RNA is generally detectable in upper respiratory specimens during the acute phase of infection. The lowest concentration of SARS-CoV-2 viral copies this assay can detect is 138 copies/mL. A negative result does not preclude SARS-Cov-2 infection and should not be used as the sole basis for treatment or other patient management decisions. A negative result may occur with  improper specimen collection/handling, submission of specimen other than nasopharyngeal swab, presence of viral mutation(s) within the areas targeted by this assay, and inadequate number of viral copies(<138 copies/mL). A negative result must be combined with clinical observations, patient history, and epidemiological information. The expected result is Negative.  Fact Sheet for Patients:  BloggerCourse.com  Fact Sheet for Healthcare Providers:   SeriousBroker.it  This test is no t yet approved or cleared by the [place] FDA and  has been authorized for detection and/or diagnosis of SARS-CoV-2 by FDA under an Emergency Use Authorization (EUA). This EUA will remain  in effect (meaning this test can be used) for the duration of the COVID-19 declaration under Section 564(b)(1) of the Act, 21 U.S.C.section 360bbb-3(b)(1), unless the authorization is terminated  or revoked sooner.       Influenza A by PCR NEGATIVE NEGATIVE Final   Influenza B by PCR NEGATIVE NEGATIVE Final    Comment: (NOTE) The Xpert Xpress SARS-CoV-2/FLU/RSV plus assay is intended as an aid in the diagnosis of influenza from Nasopharyngeal swab specimens and  should not be used as a sole basis for treatment. Nasal washings and aspirates are unacceptable for Xpert Xpress SARS-CoV-2/FLU/RSV testing.  Fact Sheet for Patients: BloggerCourse.com  Fact Sheet for Healthcare Providers: SeriousBroker.it  This test is not yet approved or cleared by the [place] FDA and has been authorized for detection and/or diagnosis of SARS-CoV-2 by FDA under an Emergency Use Authorization (EUA). This EUA will remain in effect (meaning this test can be used) for the duration of the COVID-19 declaration under Section 564(b)(1) of the Act, 21 U.S.C. section 360bbb-3(b)(1), unless the authorization is terminated or revoked.     Resp Syncytial Virus by PCR NEGATIVE NEGATIVE Final    Comment: (NOTE) Fact Sheet for Patients: BloggerCourse.com  Fact Sheet for Healthcare Providers: SeriousBroker.it  This test is not yet approved or cleared by the [place] FDA and has been authorized for detection and/or diagnosis of SARS-CoV-2 by FDA under an Emergency Use Authorization (EUA). This EUA will remain in effect (meaning this test can be used) for  the duration of the COVID-19 declaration under Section 564(b)(1) of the Act, 21 U.S.C. section 360bbb-3(b)(1), unless the authorization is terminated or revoked.  Performed at [facility], [address] [place], [place] [address]   Blood Culture (routine x 2)     Status: Abnormal   Collection Time: 11/28/22  8:44 AM   Specimen: BLOOD  Result Value Ref Range Status   Specimen Description   Final    BLOOD BLOOD RIGHT FOREARM Performed at [facility], [address] [place], [place] [address]    Special Requests   Final    Blood Culture results may not be optimal due to an excessive volume of blood received in culture bottles BOTTLES DRAWN AEROBIC AND ANAEROBIC Performed at [facility], [address] [place], [place] [address]    Culture  Setup Time   Final    GRAM NEGATIVE RODS ANAEROBIC BOTTLE ONLY CRITICAL RESULT CALLED TO, READ BACK BY AND VERIFIED WITH: L. Loletta Specter PHARMD, AT 1351 11/29/22 Renato Shin Performed at [facility], [address] [place], [place] [address]    Culture PSEUDOMONAS AERUGINOSA (A)  Final   Report Status 12/01/2022 FINAL  Final   Organism ID, Bacteria PSEUDOMONAS AERUGINOSA  Final      Susceptibility   Pseudomonas aeruginosa - MIC*    CEFTAZIDIME <=1 SENSITIVE Sensitive     CIPROFLOXACIN 0.5 SENSITIVE Sensitive     GENTAMICIN <=1 SENSITIVE Sensitive     IMIPENEM <=0.25 SENSITIVE Sensitive     PIP/TAZO <=4 SENSITIVE Sensitive     CEFEPIME 2 SENSITIVE Sensitive     * PSEUDOMONAS AERUGINOSA  Urine Culture     Status: Abnormal   Collection Time: 11/28/22  8:44 AM   Specimen: Urine, Random  Result Value Ref Range Status   Specimen Description   Final    URINE, RANDOM Performed at [facility], [address] [place], [place] [address]    Special Requests   Final    NONE Reflexed from [id number] Performed at [facility], [address] [place], [place] [address]    Culture (A)   Final    >=100,000 COLONIES/mL LACTOBACILLUS SPECIES Standardized susceptibility testing for this organism is not available. Performed at [facility], [address], [place], [place] [address]    Report Status 11/29/2022 FINAL  Final  Blood Culture ID Panel (Reflexed)  Status: Abnormal   Collection Time: 11/28/22  8:44 AM  Result Value Ref Range Status   Enterococcus faecalis NOT DETECTED NOT DETECTED Final   Enterococcus Faecium NOT DETECTED NOT DETECTED Final   Listeria monocytogenes NOT DETECTED NOT DETECTED Final   Staphylococcus species NOT DETECTED NOT DETECTED Final   Staphylococcus aureus (BCID) NOT DETECTED NOT DETECTED Final   Staphylococcus epidermidis NOT DETECTED NOT DETECTED Final   Staphylococcus lugdunensis NOT DETECTED NOT DETECTED Final   Streptococcus species NOT DETECTED NOT DETECTED Final   Streptococcus agalactiae NOT DETECTED NOT DETECTED Final   Streptococcus pneumoniae NOT DETECTED NOT DETECTED Final   Streptococcus pyogenes NOT DETECTED NOT DETECTED Final   A.calcoaceticus-baumannii NOT DETECTED NOT DETECTED Final   Bacteroides fragilis NOT DETECTED NOT DETECTED Final   Enterobacterales NOT DETECTED NOT DETECTED Final   Enterobacter cloacae complex NOT DETECTED NOT DETECTED Final   Escherichia coli NOT DETECTED NOT DETECTED Final   Klebsiella aerogenes NOT DETECTED NOT DETECTED Final   Klebsiella oxytoca NOT DETECTED NOT DETECTED Final   Klebsiella pneumoniae NOT DETECTED NOT DETECTED Final   Proteus species NOT DETECTED NOT DETECTED Final   Salmonella species NOT DETECTED NOT DETECTED Final   Serratia marcescens NOT DETECTED NOT DETECTED Final   Haemophilus influenzae NOT DETECTED NOT DETECTED Final   Neisseria meningitidis NOT DETECTED NOT DETECTED Final   Pseudomonas aeruginosa DETECTED (A) NOT DETECTED Final    Comment: CRITICAL RESULT CALLED TO, READ BACK BY AND VERIFIED WITH: L. POINDEXTER PHARMD, AT 1351 11/29/22 D. VANHOOK     Stenotrophomonas maltophilia NOT DETECTED NOT DETECTED Final   Candida albicans NOT DETECTED NOT DETECTED Final   Candida auris NOT DETECTED NOT DETECTED Final   Candida glabrata NOT DETECTED NOT DETECTED Final   Candida krusei NOT DETECTED NOT DETECTED Final   Candida parapsilosis NOT DETECTED NOT DETECTED Final   Candida tropicalis NOT DETECTED NOT DETECTED Final   Cryptococcus neoformans/gattii NOT DETECTED NOT DETECTED Final   CTX-M ESBL NOT DETECTED NOT DETECTED Final   Carbapenem resistance IMP NOT DETECTED NOT DETECTED Final   Carbapenem resistance KPC NOT DETECTED NOT DETECTED Final   Carbapenem resistance NDM NOT DETECTED NOT DETECTED Final   Carbapenem resistance VIM NOT DETECTED NOT DETECTED Final    Comment: Performed at [facility], [address], [place], [place] [address]  Blood Culture (routine x 2)     Status: None (Preliminary result)   Collection Time: 11/28/22  9:02 AM   Specimen: BLOOD  Result Value Ref Range Status   Specimen Description   Final    BLOOD RIGHT ANTECUBITAL Performed at [facility], [address] [place], [place] [address]    Special Requests   Final    Blood Culture adequate volume BOTTLES DRAWN AEROBIC AND ANAEROBIC Performed at [facility], [address] [place], [place] [address]    Culture   Final    NO GROWTH 3 DAYS Performed at [facility], [address] [place], [place] [address]    Report Status PENDING  Incomplete  Culture, blood (Routine X 2) w Reflex to ID Panel     Status: None (Preliminary result)   Collection Time: 11/30/22  6:25 PM   Specimen: BLOOD RIGHT ARM  Result Value Ref Range Status   Specimen Description   Final    BLOOD RIGHT ARM Performed at [facility], [address] [place], [place] [address]    Special  Requests   Final    BOTTLES DRAWN AEROBIC ONLY Blood Culture adequate volume Performed at [facility], [address], [place],  [place] [address]    Culture   Final    NO GROWTH < 12 HOURS Performed at [facility], [address] [place], [place] [address]    Report Status PENDING  Incomplete  Culture, blood (Routine X 2) w Reflex to ID Panel     Status: None (Preliminary result)   Collection Time: 11/30/22  6:30 PM   Specimen: BLOOD LEFT HAND  Result Value Ref Range Status   Specimen Description   Final    BLOOD LEFT HAND Performed at [facility], [address], [place], [place] [address]    Special Requests   Final    BOTTLES DRAWN AEROBIC ONLY Blood Culture adequate volume Performed at [facility], [address], [place], [place] [address]    Culture   Final    NO GROWTH < 12 HOURS Performed at [facility], [address], [place], [place] [address]    Report Status PENDING  Incomplete         Radiology Studies: CT HEAD WO CONTRAST ( )  Result Date: 11/30/2022 CLINICAL DATA:  Transient ischemic attack EXAM: CT HEAD WITHOUT CONTRAST TECHNIQUE: Contiguous axial images were obtained from the base of the skull through the vertex without intravenous contrast. RADIATION DOSE REDUCTION: This exam was performed according to the departmental dose-optimization program which includes automated exposure control, adjustment of the mA and/or kV according to patient size and/or use of iterative reconstruction technique. COMPARISON:  09/01/2021 FINDINGS: Brain: There is no mass, hemorrhage or extra-axial collection. The size and configuration of the ventricles and extra-axial CSF spaces are normal. 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 Vascular: Atherosclerotic calcification of the vertebral and internal carotid arteries at the skull base. No abnormal hyperdensity of the major intracranial arteries or dural venous sinuses. Skull: The visualized skull base, calvarium and extracranial soft tissues are normal. Sinuses/Orbits: No fluid levels or  advanced mucosal thickening of the visualized paranasal sinuses. No mastoid or middle ear effusion. The orbits are normal. IMPRESSION: 1. No acute intracranial abnormality. 2. 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 Electronically Signed   By: Deatra Robinson M.D.   On: 11/30/2022 23:01   US RENAL  Result Date: 11/30/2022 CLINICAL DATA:  [id number] AKI (acute kidney injury) (HCC) [id number] EXAM: RENAL / URINARY TRACT ULTRASOUND COMPLETE COMPARISON:  CT 09/01/2021 FINDINGS: Right Kidney: Renal measurements: 9.9 x 5.8 x 3.8 cm = volume: 114.5 mL. No hydronephrosis. Increased renal cortical echogenicity. Stent is likely visualized. Left Kidney: Renal measurements: 8.1 x 4.6 x 4.6 cm = volume: 89.6 mL. No hydronephrosis. Increased renal cortical echogenicity. Bladder: Minimal bladder distension with circumferential bladder wall thickening. Other: Limited exam due to bowel gas and body habitus. IMPRESSION: No hydronephrosis. Increased renal cortical echogenicity bilaterally, as can be seen in medical renal disease. Minimal bladder distension with circumferential bladder wall thickening, similar to prior CT, can be seen in cystitis. Correlate with urinalysis. Electronically Signed   By: Caprice Renshaw M.D.   On: 11/30/2022 11:56           LOS: 4 days   Time spent= 35 mins    Aniela Caniglia Joline Maxcy, MD [facility]  If 7PM-7AM, please contact night-coverage  12/02/2022, 7:54 AM

## 2022-12-02 NOTE — Progress Notes (Signed)
ANTICOAGULATION CONSULT NOTE   Pharmacy Consult for Heparin Indication: history DVT  Allergies  Allergen Reactions   Taxotere [Docetaxel] Swelling    Throat swelling    Patient Measurements: Height: 5\' 5"  (165.1 cm) Weight: 60.8 kg (134 lb) IBW/kg (Calculated) : 57 Heparin Dosing Weight: 60.8 kg  Vital Signs: Temp: 98.6 F (37 C) (05/11 0832) Temp Source: Oral (05/11 0832) BP: 135/77 (05/11 0832) Pulse Rate: 67 (05/11 0832)  Labs: Recent Labs    11/30/22 0315 11/30/22 0315 11/30/22 1204 12/01/22 0059 12/01/22 0930 12/01/22 2126 12/02/22 0416  HGB 9.0*  --   --  9.6*  --   --  9.8*  HCT 27.0*  --   --  28.6*  --   --  30.0*  PLT 125*  --   --  151  --   --  195  APTT  --   --  41*  --   --   --   --   LABPROT  --   --  14.8  --   --   --   --   INR  --   --  1.1  --   --   --   --   HEPARINUNFRC  --    < >  --  0.78* 0.83* 0.55 0.51  CREATININE 4.81*  --   --  4.67*  --   --  4.32*   < > = values in this interval not displayed.     Estimated Creatinine Clearance: 12.8 mL/min (A) (by C-G formula based on SCr of 4.32 mg/dL (H)).   Medications:  PTA Fondaparinux 7.5mg  sq daily (contraindicated per drug labeling with CrCl chronically < 20 ml/min). Last dose per patient 5/6 in morning.  Assessment: Patient is an 57 y.o female admitted on 5/7 for sepsis secondary to urinary tract infection.  Now with Pseudomonas bacteremia and AKI on CKD.  On 5/8 nephrostomy exchanged by IR.  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       Today, 12/02/22 Daily (confirmatory) heparin level remains therapeutic and stable on 750 units/hr Hgb low but stable; Plt improved to WNL No complications of therapy noted  Goal of Therapy:  Heparin level 0.3-0.7 units/ml Monitor platelets by anticoagulation protocol: Yes   Plan:  - Continue Heparin gtt @ 750 units/hr  - Daily heparin level and CBC - Monitor for signs/symptoms of bleeding   Alexi Geibel A,  PharmD 12/02/2022,11:13 AM

## 2022-12-02 NOTE — Consult Note (Signed)
[facility] Face-to-Face Psychiatry Consult   Reason for Consult: ''Depression'' Referring Physician:  Dimple Nanas, MD Patient Identification: Jaime Gomez MRN:  [id number] Principal Diagnosis: Sepsis secondary to UTI [facility]) Diagnosis:  Principal Problem:   Sepsis secondary to UTI [facility]) Active Problems:   Endometrial cancer, FIGO stage IVB (HCC)   Leg DVT (deep venous thromboembolism), chronic, right (HCC)   Acute renal failure superimposed on stage 3a chronic kidney disease (HCC)   Essential (primary) hypertension   Type 2 diabetes mellitus without complications (HCC)   Abnormality of lung on CXR   Metabolic acidosis with normal anion gap and failure of bicarbonate regeneration   Major depressive disorder, single episode, moderate (HCC)   Total Time spent with patient: 1 hour  Subjective:   Jaime Gomez is a 57 y.o. female patient admitted with fever, weakness.  Note: Patient gave permission to be interviewed in the presence of her twin sister([name])   HPI:  57 y.o. female with medical history significant of endometrial cancer (currently on oral chemotherapy), CKD , chronic iron deficiency anemia (received infusion of ferelicit and transfusion of PRBC's last week), Hypertension, DM II, Rt LE DVT on Arixtra who was admitted to the hospital for persistent fever, generalized bodyaches and weakness. Patient and her sister lives together, they reports that patient started feeling depressed about 6 months ago but it has been getting progressively worse for the past 6 months.Patient is tto weak to fully participate in evaluation. However, her twin sister reports that patient has been experiencing low energy level, lack of motivation, self with withdrawal, poor appetite and poor sleep. However, patient denies psychosis, delusions, mood swings, self harming thoughts, drug and alcohol use. She states that she was doing well until she was diagnosed with cancer when her life changed. She worries and  get apprehensive about her future but denies negative thoughts. Patient agreed to a trial of anti-depressant and a referral to a therapist for counseling upon discharged  Past Psychiatric History: none reported  Risk to Self:  denies  Risk to Others:  denies Prior Inpatient Therapy:  N/A Prior Outpatient Therapy:  N/A  Past Medical History:  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 failure 08/18/2021   Diabetes mellitus without complication (HCC)    Endometrial cancer, FIGO stage IVB (HCC) 08/18/2021   Goals of care, counseling/discussion 08/18/2021   History of kidney stones    Hypertension    Leg DVT (deep venous thromboembolism), chronic, right (HCC) 08/18/2021   Pneumonia     Past Surgical History:  Procedure Laterality Date   ABDOMINAL HYSTERECTOMY     CYSTOSCOPY W/ URETERAL STENT PLACEMENT Bilateral 09/01/2021   Procedure: CYSTOSCOPY WITH RETROGRADE PYELOGRAM/URETERAL STENT PLACEMENT;  Surgeon: Jannifer Hick, MD;  Location: [facility];  Service: Urology;  Laterality: Bilateral;   CYSTOSCOPY WITH STENT PLACEMENT Right 05/19/2022   Procedure: CYSTOSCOPY WITH STENT CHANGE;  Surgeon: Jannifer Hick, MD;  Location: [facility];  Service: Urology;  Laterality: Right;  ONLY NEEDS 30 MIN   HERNIA REPAIR     IR CONVERT LEFT NEPHROSTOMY TO NEPHROURETERAL CATH  11/02/2021   IR CV LINE INJECTION  08/23/2021   IR IMAGING GUIDED PORT INSERTION  11/14/2021   IR NEPHROSTOMY EXCHANGE LEFT  11/02/2021   IR NEPHROSTOMY EXCHANGE LEFT  01/20/2022   IR NEPHROSTOMY EXCHANGE LEFT  11/29/2022   IR NEPHROSTOMY PLACEMENT LEFT  09/03/2021   IR REMOVAL TUN ACCESS W/ PORT W/O FL MOD SED  11/14/2021  Family History:  Family History  Problem Relation Age of Onset   Cancer Maternal Grandmother    Family Psychiatric  History:  Social History:  Social History   Substance and Sexual Activity  Alcohol Use Not Currently     Social History   Substance and Sexual Activity  Drug Use Never     Social History   Socioeconomic History   Marital status: Single    Spouse name: Not on file   Number of children: Not on file   Years of education: Not on file   Highest education level: Not on file  Occupational History   Not on file  Tobacco Use   Smoking status: Never   Smokeless tobacco: Never  Vaping Use   Vaping Use: Never used  Substance and Sexual Activity   Alcohol use: Not Currently   Drug use: Never   Sexual activity: Not on file  Other Topics Concern   Not on file  Social History Narrative   Not on file   Social Determinants of Health   Financial Resource Strain: Low Risk  (06/27/2022)   Overall Financial Resource Strain (CARDIA)    Difficulty of Paying Living Expenses: Not hard at all  Food Insecurity: No Food Insecurity (11/28/2022)   Hunger Vital Sign    Worried About Running Out of Food in the Last Year: Never true    Ran Out of Food in the Last Year: Never true  Transportation Needs: No Transportation Needs (11/28/2022)   PRAPARE - Administrator, Civil Service (Medical): No    Lack of Transportation (Non-Medical): No  Physical Activity: Not on file  Stress: No Stress Concern Present (06/27/2022)   [employer] of Occupational Health - Occupational Stress Questionnaire    Feeling of Stress : Not at all  Social Connections: Not on file   Additional Social History:    Allergies:   Allergies  Allergen Reactions   Taxotere [Docetaxel] Swelling    Throat swelling    Labs:  Results for orders placed or performed during the hospital encounter of 11/28/22 (from the past 48 hour(s))  Glucose, capillary     Status: Abnormal   Collection Time: 11/30/22  4:43 PM  Result Value Ref Range   Glucose-Capillary 139 (H) 70 - 99 mg/dL    Comment: Glucose reference range applies only to samples taken after fasting for at least 8 hours.  Glucose, capillary     Status: Abnormal   Collection Time: 11/30/22  6:17 PM  Result Value Ref Range    Glucose-Capillary 148 (H) 70 - 99 mg/dL    Comment: Glucose reference range applies only to samples taken after fasting for at least 8 hours.  Culture, blood (Routine X 2) w Reflex to ID Panel     Status: None (Preliminary result)   Collection Time: 11/30/22  6:25 PM   Specimen: BLOOD RIGHT ARM  Result Value Ref Range   Specimen Description      BLOOD RIGHT ARM Performed at [facility], [address] [place], [place] [address]    Special Requests      BOTTLES DRAWN AEROBIC ONLY Blood Culture adequate volume Performed at [facility], [address] [place], [place] [address]    Culture      NO GROWTH < 12 HOURS Performed at [facility], [address], [place], [place] [address]    Report Status PENDING   Culture, blood (Routine X 2) w Reflex  to ID Panel     Status: None (Preliminary result)   Collection Time: 11/30/22  6:30 PM   Specimen: BLOOD LEFT HAND  Result Value Ref Range   Specimen Description      BLOOD LEFT HAND Performed at [facility], [address], [place], [place] [address]    Special Requests      BOTTLES DRAWN AEROBIC ONLY Blood Culture adequate volume Performed at [facility], [address], [place], [place] [address]    Culture      NO GROWTH < 12 HOURS Performed at [facility], [address], [place], [place] [address]    Report Status PENDING   Glucose, capillary     Status: Abnormal   Collection Time: 11/30/22  7:59 PM  Result Value Ref Range   Glucose-Capillary 159 (H) 70 - 99 mg/dL    Comment: Glucose reference range applies only to samples taken after fasting for at least 8 hours.  Glucose, capillary     Status: Abnormal   Collection Time: 12/01/22 12:05 AM  Result Value Ref Range   Glucose-Capillary 230 (H) 70 - 99 mg/dL    Comment: Glucose reference range applies only to samples taken after fasting for at least 8 hours.  Heparin level (unfractionated)     Status: Abnormal    Collection Time: 12/01/22 12:59 AM  Result Value Ref Range   Heparin Unfractionated 0.78 (H) 0.30 - 0.70 IU/mL    Comment: (NOTE) The clinical reportable range upper limit is being lowered to >1.10 to align with the FDA approved guidance for the current laboratory assay.  If heparin results are below expected values, and patient dosage has  been confirmed, suggest follow up testing of antithrombin III levels. Performed at [facility], [address] [place], [place] [address]   Basic metabolic panel     Status: Abnormal   Collection Time: 12/01/22 12:59 AM  Result Value Ref Range   Sodium 129 (L) 135 - 145 mmol/L   Potassium 4.0 3.5 - 5.1 mmol/L   Chloride 102 98 - 111 mmol/L   CO2 16 (L) 22 - 32 mmol/L   Glucose, Bld 256 (H) 70 - 99 mg/dL    Comment: Glucose reference range applies only to samples taken after fasting for at least 8 hours.   BUN 78 (H) 6 - 20 mg/dL   Creatinine, Ser 2.95 (H) 0.44 - 1.00 mg/dL   Calcium 7.5 (L) 8.9 - 10.3 mg/dL   GFR, Estimated 10 (L) >60 mL/min    Comment: (NOTE) Calculated using the CKD-EPI Creatinine Equation (2021)    Anion gap 11 5 - 15    Comment: Performed at [facility], [address], [place], [place] [address]  CBC     Status: Abnormal   Collection Time: 12/01/22 12:59 AM  Result Value Ref Range   WBC 3.7 (L) 4.0 - 10.5 K/uL   RBC 3.27 (L) 3.87 - 5.11 MIL/uL   Hemoglobin 9.6 (L) 12.0 - 15.0 g/dL   HCT 86.5 (L) 78.4 - 69.6 %   MCV 87.5 80.0 - 100.0 fL   MCH 29.4 26.0 - 34.0 pg   MCHC 33.6 30.0 - 36.0 g/dL   RDW 29.5 28.4 - 13.2 %   Platelets 151 150 - 400 K/uL   nRBC 0.0 0.0 - 0.2 %    Comment: Performed at [facility], [address], [place], [place] [address]  Magnesium  Status: None   Collection Time: 12/01/22 12:59 AM  Result Value Ref Range   Magnesium 2.3 1.7 - 2.4 mg/dL    Comment: Performed at [facility], [address]  [place], [place] [address]  Glucose, capillary     Status: Abnormal   Collection Time: 12/01/22  5:25 AM  Result Value Ref Range   Glucose-Capillary 200 (H) 70 - 99 mg/dL    Comment: Glucose reference range applies only to samples taken after fasting for at least 8 hours.  Glucose, capillary     Status: Abnormal   Collection Time: 12/01/22  7:23 AM  Result Value Ref Range   Glucose-Capillary 173 (H) 70 - 99 mg/dL    Comment: Glucose reference range applies only to samples taken after fasting for at least 8 hours.  Heparin level (unfractionated)     Status: Abnormal   Collection Time: 12/01/22  9:30 AM  Result Value Ref Range   Heparin Unfractionated 0.83 (H) 0.30 - 0.70 IU/mL    Comment: (NOTE) The clinical reportable range upper limit is being lowered to >1.10 to align with the FDA approved guidance for the current laboratory assay.  If heparin results are below expected values, and patient dosage has  been confirmed, suggest follow up testing of antithrombin III levels. Performed at [facility], [address] [place], [place] [address]   Glucose, capillary     Status: Abnormal   Collection Time: 12/01/22 11:57 AM  Result Value Ref Range   Glucose-Capillary 122 (H) 70 - 99 mg/dL    Comment: Glucose reference range applies only to samples taken after fasting for at least 8 hours.  Glucose, capillary     Status: Abnormal   Collection Time: 12/01/22  6:28 PM  Result Value Ref Range   Glucose-Capillary 171 (H) 70 - 99 mg/dL    Comment: Glucose reference range applies only to samples taken after fasting for at least 8 hours.  Glucose, capillary     Status: Abnormal   Collection Time: 12/01/22  8:57 PM  Result Value Ref Range   Glucose-Capillary 183 (H) 70 - 99 mg/dL    Comment: Glucose reference range applies only to samples taken after fasting for at least 8 hours.  Heparin level (unfractionated)     Status: None   Collection Time: 12/01/22  9:26 PM  Result  Value Ref Range   Heparin Unfractionated 0.55 0.30 - 0.70 IU/mL    Comment: (NOTE) The clinical reportable range upper limit is being lowered to >1.10 to align with the FDA approved guidance for the current laboratory assay.  If heparin results are below expected values, and patient dosage has  been confirmed, suggest follow up testing of antithrombin III levels. Performed at [facility], [address], [place], [place] [address]   Basic metabolic panel     Status: Abnormal   Collection Time: 12/02/22  4:16 AM  Result Value Ref Range   Sodium 137 135 - 145 mmol/L    Comment: DELTA CHECK NOTED   Potassium 3.4 (L) 3.5 - 5.1 mmol/L   Chloride 109 98 - 111 mmol/L   CO2 14 (L) 22 - 32 mmol/L   Glucose, Bld 59 (L) 70 - 99 mg/dL    Comment: Glucose reference range applies only to samples taken after fasting for at least 8 hours.   BUN 80 (H) 6 - 20 mg/dL   Creatinine, Ser 9.14 (H) 0.44 - 1.00 mg/dL   Calcium  8.0 (L) 8.9 - 10.3 mg/dL   GFR, Estimated 11 (L) >60 mL/min    Comment: (NOTE) Calculated using the CKD-EPI Creatinine Equation (2021)    Anion gap 14 5 - 15    Comment: Performed at [facility], [address], [place], [place] [address]  Magnesium     Status: None   Collection Time: 12/02/22  4:16 AM  Result Value Ref Range   Magnesium 2.1 1.7 - 2.4 mg/dL    Comment: Performed at [facility], [address], [place], [place] [address]  CBC with Differential/Platelet     Status: Abnormal   Collection Time: 12/02/22  4:16 AM  Result Value Ref Range   WBC 5.9 4.0 - 10.5 K/uL   RBC 3.43 (L) 3.87 - 5.11 MIL/uL   Hemoglobin 9.8 (L) 12.0 - 15.0 g/dL   HCT 09.8 (L) 11.9 - 14.7 %   MCV 87.5 80.0 - 100.0 fL   MCH 28.6 26.0 - 34.0 pg   MCHC 32.7 30.0 - 36.0 g/dL   RDW 82.9 (H) 56.2 - 13.0 %   Platelets 195 150 - 400 K/uL   nRBC 0.0 0.0 - 0.2 %   Neutrophils Relative % 79 %   Neutro Abs 4.6 1.7 - 7.7 K/uL   Lymphocytes  Relative 8 %   Lymphs Abs 0.5 (L) 0.7 - 4.0 K/uL   Monocytes Relative 9 %   Monocytes Absolute 0.6 0.1 - 1.0 K/uL   Eosinophils Relative 2 %   Eosinophils Absolute 0.1 0.0 - 0.5 K/uL   Basophils Relative 0 %   Basophils Absolute 0.0 0.0 - 0.1 K/uL   Immature Granulocytes 2 %   Abs Immature Granulocytes 0.10 (H) 0.00 - 0.07 K/uL    Comment: Performed at [facility], [address], [place], [place] [address]  Heparin level (unfractionated)     Status: None   Collection Time: 12/02/22  4:16 AM  Result Value Ref Range   Heparin Unfractionated 0.51 0.30 - 0.70 IU/mL    Comment: (NOTE) The clinical reportable range upper limit is being lowered to >1.10 to align with the FDA approved guidance for the current laboratory assay.  If heparin results are below expected values, and patient dosage has  been confirmed, suggest follow up testing of antithrombin III levels. Performed at [facility], [address], [place], [place] [address]   Glucose, capillary     Status: Abnormal   Collection Time: 12/02/22  5:31 AM  Result Value Ref Range   Glucose-Capillary 49 (L) 70 - 99 mg/dL    Comment: Glucose reference range applies only to samples taken after fasting for at least 8 hours.  Glucose, capillary     Status: Abnormal   Collection Time: 12/02/22  6:12 AM  Result Value Ref Range   Glucose-Capillary 69 (L) 70 - 99 mg/dL    Comment: Glucose reference range applies only to samples taken after fasting for at least 8 hours.  Glucose, capillary     Status: None   Collection Time: 12/02/22  6:32 AM  Result Value Ref Range   Glucose-Capillary 73 70 - 99 mg/dL    Comment: Glucose reference range applies only to samples taken after fasting for at least 8 hours.  Glucose, capillary     Status: None   Collection Time: 12/02/22  8:11 AM  Result Value Ref Range   Glucose-Capillary 83 70 - 99 mg/dL    Comment: Glucose reference range  applies only to samples  taken after fasting for at least 8 hours.  Glucose, capillary     Status: Abnormal   Collection Time: 12/02/22 12:28 PM  Result Value Ref Range   Glucose-Capillary 121 (H) 70 - 99 mg/dL    Comment: Glucose reference range applies only to samples taken after fasting for at least 8 hours.    Current Facility-Administered Medications  Medication Dose Route Frequency Provider Last Rate Last Admin   0.9 %  sodium chloride infusion   Intravenous Continuous Dimple Nanas, MD 75 mL/hr at 12/02/22 1044 New Bag at 12/02/22 1044   acetaminophen (TYLENOL) tablet 650 mg  650 mg Oral Q6H PRN Swayze, Ava, DO   650 mg at 11/29/22 1737   Or   acetaminophen (TYLENOL) suppository 650 mg  650 mg Rectal Q6H PRN Swayze, Ava, DO       acetaminophen (TYLENOL) tablet 1,000 mg  1,000 mg Oral Once Gwyneth Sprout, MD       carvedilol (COREG) tablet 12.5 mg  12.5 mg Oral BID WC Swayze, Ava, DO   12.5 mg at 12/02/22 1036   ceFEPIme (MAXIPIME) 2 g in sodium chloride 0.9 % 100 mL IVPB  2 g Intravenous Q24H Bell, Lorin C, RPH 200 mL/hr at 12/02/22 1046 2 g at 12/02/22 1046   Chlorhexidine Gluconate Cloth 2 % PADS 6 each  6 each Topical Daily Dimple Nanas, MD   6 each at 12/02/22 1039   folic acid (FOLVITE) tablet 1 mg  1 mg Oral Q0600 Swayze, Ava, DO   1 mg at 12/02/22 1036   gabapentin (NEURONTIN) capsule 100 mg  100 mg Oral BID Swayze, Ava, DO   100 mg at 12/02/22 1036   guaiFENesin (ROBITUSSIN) 100 MG/5ML liquid 5 mL  5 mL Oral Q4H PRN Amin, Ankit Chirag, MD       heparin ADULT infusion 100 units/mL (25000 units/21mL)  750 Units/hr Intravenous Continuous Amin, Ankit Chirag, MD 7.5 mL/hr at 12/01/22 1508 750 Units/hr at 12/01/22 1508   hydrALAZINE (APRESOLINE) injection 10 mg  10 mg Intravenous Q4H PRN Amin, Ankit Chirag, MD       HYDROmorphone (DILAUDID) injection 0.5-1 mg  0.5-1 mg Intravenous Q2H PRN Swayze, Ava, DO       insulin aspart (novoLOG) injection 0-9 Units  0-9 Units Subcutaneous TID WC Amin,  Ankit Chirag, MD   2 Units at 12/01/22 1841   insulin detemir (LEVEMIR) injection 5 Units  5 Units Subcutaneous QHS Amin, Ankit Chirag, MD       ipratropium-albuterol (DUONEB) 0.5-2.5 (3) MG/3ML nebulizer solution 3 mL  3 mL Nebulization Q4H PRN Amin, Ankit Chirag, MD       magnesium citrate solution 1 Bottle  1 Bottle Oral Once PRN Swayze, Ava, DO       metoprolol tartrate (LOPRESSOR) injection 5 mg  5 mg Intravenous Q4H PRN Amin, Ankit Chirag, MD       mirtazapine (REMERON) tablet 7.5 mg  7.5 mg Oral QHS Amin, Ankit Chirag, MD   7.5 mg at 12/02/22 1036   ondansetron (ZOFRAN) tablet 4 mg  4 mg Oral Q6H PRN Swayze, Ava, DO       Or   ondansetron (ZOFRAN) injection 4 mg  4 mg Intravenous Q6H PRN Swayze, Ava, DO       oxyCODONE (Oxy IR/ROXICODONE) immediate release tablet 5 mg  5 mg Oral Q4H PRN Swayze, Ava, DO   5 mg at 11/29/22 1500   polyethylene glycol (MIRALAX /  GLYCOLAX) packet 17 g  17 g Oral Daily PRN Amin, Ankit Chirag, MD       senna-docusate (Senokot-S) tablet 1 tablet  1 tablet Oral QHS PRN Swayze, Ava, DO       sodium bicarbonate tablet 650 mg  650 mg Oral TID Swayze, Ava, DO   650 mg at 12/02/22 1036   sorbitol 70 % solution 30 mL  30 mL Oral Daily PRN Swayze, Ava, DO       traZODone (DESYREL) tablet 50 mg  50 mg Oral QHS PRN Amin, Ankit Chirag, MD   50 mg at 12/01/22 2200    Musculoskeletal: Strength & Muscle Tone:  not assessed, pt is weak Gait & Station: unsteady Patient leans: N/A       Psychiatric Specialty Exam:  Presentation  General Appearance:  Appropriate for Environment  Eye Contact: Minimal  Speech: Clear and Coherent  Speech Volume: Decreased  Handedness: Right   Mood and Affect  Mood: Dysphoric  Affect: Congruent   Thought Process  Thought Processes: Goal Directed; Linear  Descriptions of Associations:Intact  Orientation:Full (Time, Place and Person)  Thought Content:Logical  History of Schizophrenia/Schizoaffective disorder:No  data recorded Duration of Psychotic Symptoms:No data recorded Hallucinations:Hallucinations: None  Ideas of Reference:None  Suicidal Thoughts:Suicidal Thoughts: No  Homicidal Thoughts:Homicidal Thoughts: No   Sensorium  Memory: Immediate Good; Recent Good  Judgment: Intact  Insight: Fair   Art therapist  Concentration: Good  Attention Span: Good  Recall: Fair  Fund of Knowledge: Fair  Language: Good   Psychomotor Activity  Psychomotor Activity: Psychomotor Activity: Decreased   Assets  Assets: Desire for Improvement; Social Support   Sleep  Sleep: Sleep: Poor   Physical Exam: Physical Exam ROS Blood pressure 135/77, pulse 67, temperature 98.6 F (37 C), temperature source Oral, resp. rate 20, height 5\' 5"  (1.651 m), weight 60.8 kg, SpO2 100 %. Body mass index is 22.3 kg/m.  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   Patient does not meet criteria for psychiatric inpatient admission. Supportive therapy provided about ongoing stressors. Psychiatric service is signing out  Thedore Mins, MD 12/02/2022 2:28 PM

## 2022-12-02 NOTE — Plan of Care (Signed)
  Problem: Coping: Goal: Ability to identify and develop effective coping behavior will improve Outcome: Progressing   Problem: Self-Concept: Goal: Level of anxiety will decrease Outcome: Progressing   Problem: Safety: Goal: Ability to remain free from injury will improve Outcome: Progressing   Problem: Clinical Measurements: Goal: Ability to maintain clinical measurements within normal limits will improve Outcome: Progressing

## 2022-12-03 ENCOUNTER — Inpatient Hospital Stay (HOSPITAL_COMMUNITY): Payer: 59

## 2022-12-03 DIAGNOSIS — N390 Urinary tract infection, site not specified: Secondary | ICD-10-CM | POA: Diagnosis not present

## 2022-12-03 DIAGNOSIS — J189 Pneumonia, unspecified organism: Secondary | ICD-10-CM

## 2022-12-03 DIAGNOSIS — A419 Sepsis, unspecified organism: Secondary | ICD-10-CM | POA: Diagnosis not present

## 2022-12-03 HISTORY — DX: Pneumonia, unspecified organism: J18.9

## 2022-12-03 LAB — GLUCOSE, CAPILLARY
Glucose-Capillary: 101 mg/dL — ABNORMAL HIGH (ref 70–99)
Glucose-Capillary: 88 mg/dL (ref 70–99)
Glucose-Capillary: 122 mg/dL — ABNORMAL HIGH (ref 70–99)
Glucose-Capillary: 167 mg/dL — ABNORMAL HIGH (ref 70–99)
Glucose-Capillary: 190 mg/dL — ABNORMAL HIGH (ref 70–99)

## 2022-12-03 LAB — TSH: TSH: 2.52 u[IU]/mL (ref 0.350–4.500)

## 2022-12-03 LAB — BLOOD GAS, VENOUS
Acid-base deficit: 10.7 mmol/L — ABNORMAL HIGH (ref 0.0–2.0)
Bicarbonate: 13.5 mmol/L — ABNORMAL LOW (ref 20.0–28.0)
O2 Saturation: 90.7 %
Patient temperature: 36.1
pCO2, Ven: 24 mmHg — ABNORMAL LOW (ref 44–60)
pH, Ven: 7.35 (ref 7.25–7.43)
pO2, Ven: 51 mmHg — ABNORMAL HIGH (ref 32–45)

## 2022-12-03 LAB — CULTURE, BLOOD (ROUTINE X 2): Culture: NO GROWTH

## 2022-12-03 LAB — AMMONIA: Ammonia: 17 umol/L (ref 9–35)

## 2022-12-03 LAB — BASIC METABOLIC PANEL
Anion gap: 3 — ABNORMAL LOW (ref 5–15)
BUN: 71 mg/dL — ABNORMAL HIGH (ref 6–20)
CO2: 13 mmol/L — ABNORMAL LOW (ref 22–32)
Calcium: 7.4 mg/dL — ABNORMAL LOW (ref 8.9–10.3)
Chloride: 116 mmol/L — ABNORMAL HIGH (ref 98–111)
Creatinine, Ser: 3.85 mg/dL — ABNORMAL HIGH (ref 0.44–1.00)
GFR, Estimated: 13 mL/min — ABNORMAL LOW (ref >60)
Glucose, Bld: 98 mg/dL (ref 70–99)
Potassium: 3.5 mmol/L (ref 3.5–5.1)
Sodium: 132 mmol/L — ABNORMAL LOW (ref 135–145)

## 2022-12-03 LAB — LACTIC ACID, PLASMA: Lactic Acid, Venous: 0.8 mmol/L (ref 0.5–1.9)

## 2022-12-03 LAB — CBC
HCT: 30.6 % — ABNORMAL LOW (ref 36.0–46.0)
Hemoglobin: 9.8 g/dL — ABNORMAL LOW (ref 12.0–15.0)
MCH: 28.8 pg (ref 26.0–34.0)
MCHC: 32 g/dL (ref 30.0–36.0)
MCV: 90 fL (ref 80.0–100.0)
Platelets: 181 10*3/uL (ref 150–400)
RBC: 3.4 MIL/uL — ABNORMAL LOW (ref 3.87–5.11)
RDW: 16.1 % — ABNORMAL HIGH (ref 11.5–15.5)
WBC: 4.7 10*3/uL (ref 4.0–10.5)
nRBC: 0 % (ref 0.0–0.2)

## 2022-12-03 LAB — VITAMIN B12: Vitamin B-12: 1055 pg/mL — ABNORMAL HIGH (ref 180–914)

## 2022-12-03 LAB — FOLATE: Folate: 28.4 ng/mL (ref >5.9)

## 2022-12-03 LAB — HEPARIN LEVEL (UNFRACTIONATED): Heparin Unfractionated: 0.46 IU/mL (ref 0.30–0.70)

## 2022-12-03 LAB — BRAIN NATRIURETIC PEPTIDE: B Natriuretic Peptide: 2665.1 pg/mL — ABNORMAL HIGH (ref 0.0–100.0)

## 2022-12-03 LAB — MAGNESIUM: Magnesium: 2 mg/dL (ref 1.7–2.4)

## 2022-12-03 MED ORDER — LABETALOL HCL 5 MG/ML IV SOLN
5.0000 mg | Freq: Four times a day (QID) | INTRAVENOUS | Status: AC
  Administered 2022-12-03 – 2022-12-06 (×11): 5 mg via INTRAVENOUS
  Filled 2022-12-03 (×12): qty 4

## 2022-12-03 MED ORDER — METHOCARBAMOL 500 MG PO TABS
500.0000 mg | ORAL_TABLET | Freq: Three times a day (TID) | ORAL | Status: DC | PRN
  Administered 2022-12-07 – 2022-12-10 (×6): 500 mg via ORAL
  Filled 2022-12-03 (×7): qty 1

## 2022-12-03 MED ORDER — LABETALOL HCL 5 MG/ML IV SOLN: 5.0000 mg | Freq: Four times a day (QID) | INTRAVENOUS | Status: DC

## 2022-12-03 MED ORDER — SODIUM CHLORIDE 0.9 % IV SOLN: INTRAVENOUS | Status: DC

## 2022-12-03 MED ORDER — OLANZAPINE 10 MG IM SOLR
5.0000 mg | Freq: Four times a day (QID) | INTRAMUSCULAR | Status: AC | PRN
  Administered 2022-12-03 – 2022-12-05 (×3): 5 mg via INTRAMUSCULAR
  Filled 2022-12-03 (×5): qty 10

## 2022-12-03 MED ORDER — METHOCARBAMOL 1000 MG/10ML IJ SOLN
500.0000 mg | Freq: Once | INTRAVENOUS | Status: AC
  Administered 2022-12-03: 500 mg via INTRAVENOUS
  Filled 2022-12-03: qty 500

## 2022-12-03 MED ORDER — SODIUM BICARBONATE 8.4 % IV SOLN
INTRAVENOUS | Status: DC
  Filled 2022-12-03: qty 150

## 2022-12-03 MED ORDER — HYDROMORPHONE HCL 1 MG/ML IJ SOLN
0.5000 mg | INTRAMUSCULAR | Status: DC | PRN
  Administered 2022-12-04 – 2022-12-07 (×15): 0.5 mg via INTRAVENOUS
  Filled 2022-12-03 (×15): qty 0.5

## 2022-12-03 NOTE — Progress Notes (Signed)
ANTICOAGULATION CONSULT NOTE   Pharmacy Consult for Heparin Indication: history DVT  Allergies  Allergen Reactions   Taxotere [Docetaxel] Swelling    Throat swelling    Patient Measurements: Height: 5\' 5"  (165.1 cm) Weight: 60.8 kg (134 lb) IBW/kg (Calculated) : 57 Heparin Dosing Weight: 60.8 kg  Vital Signs: Temp: 98.1 F (36.7 C) (05/12 0430) Temp Source: Oral (05/12 0430) BP: 168/100 (05/12 0430) Pulse Rate: 96 (05/12 0430)  Labs: Recent Labs     0000 11/30/22 1204 12/01/22 0059 12/01/22 0930 12/01/22 2126 12/02/22 0416 12/03/22 0255  HGB   < >  --  9.6*  --   --  9.8* 9.8*  HCT  --   --  28.6*  --   --  30.0* 30.6*  PLT  --   --  151  --   --  195 181  APTT  --  41*  --   --   --   --   --   LABPROT  --  14.8  --   --   --   --   --   INR  --  1.1  --   --   --   --   --   HEPARINUNFRC  --   --  0.78*   < > 0.55 0.51 0.46  CREATININE  --   --  4.67*  --   --  4.32* 3.85*   < > = values in this interval not displayed.     Estimated Creatinine Clearance: 14.3 mL/min (A) (by C-G formula based on SCr of 3.85 mg/dL (H)).   Medications:  PTA Fondaparinux 7.5mg  sq daily (contraindicated per drug labeling with CrCl chronically < 20 ml/min). Last dose per patient 5/6 in morning.  Assessment: Patient is an 57 y.o female admitted on 5/7 for sepsis secondary to urinary tract infection.  Now with Pseudomonas bacteremia and AKI on CKD.  On 5/8 nephrostomy exchanged by IR.  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       Today, 12/03/22 Daily (confirmatory) heparin level remains therapeutic and stable on 750 units/hr Hgb low but stable; Plt improved to WNL No complications of therapy noted  Goal of Therapy:  Heparin level 0.3-0.7 units/ml Monitor platelets by anticoagulation protocol: Yes   Plan:  Continue Heparin gtt @ 750 units/hr  Daily heparin level and CBC Monitor for signs/symptoms of bleeding F/u plans for long-term  anticoagulation (fondaparinux contraindicated per manufacturer labeling with CrCl < 30 ml/min; very limited data to support the lowest available dose -- 1.5 mg daily -- for CrCl as low as 20 ml/min)   Kaidyn Javid A, PharmD 12/03/2022,8:55 AM

## 2022-12-03 NOTE — Evaluation (Signed)
Physical Therapy Evaluation Patient Details Name: Jaime Gomez MRN: [id number] DOB: Feb 27, 1966 Today's Date: 12/03/2022  History of Present Illness  "57 y.o. 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 The patient has had placement of a left sided nephrostomy tube which was due for replacement on 11/29/2022.  Patient admitted for sepsis secondary to urinary tract infection.  IR consulted for nephrostomy tube change.  Patient tolerated the procedure well.  Due to anemia, 2 units PRBC was given with appropriate response.  Hospital course complicated by acute kidney injury which is slowly improving."  Clinical Impression  Pt admitted with above diagnosis.  Pt currently with functional limitations due to the deficits listed below (see PT Problem List). Pt will benefit from acute skilled PT to increase their independence and safety with mobility to allow discharge.  Pt assisted to sitting EOB with MD in room.  Pt does not following commands or cues and requiring total assist at this time.  Pt assisted with standing and utilized stedy to transfer to recliner.  Pt's sister, [name], present today and reports pt typically independent and cognitively intact at baseline.  Pt may benefit from post acute rehab upon d/c.         Recommendations for follow up therapy are one component of a multi-disciplinary discharge planning process, led by the attending physician.  Recommendations may be updated based on patient status, additional functional criteria and insurance authorization.  Follow Up Recommendations       Assistance Recommended at Discharge Frequent or constant Supervision/Assistance  Patient can return home with the following  Two people to help with walking and/or transfers;Two people to help  with bathing/dressing/bathroom;Assistance with cooking/housework;Assist for transportation;Help with stairs or ramp for entrance;Assistance with feeding;Direct supervision/assist for medications management;Direct supervision/assist for financial management    Equipment Recommendations Rolling walker (2 wheels) (TBD if home)  Recommendations for Other Services       Functional Status Assessment Patient has had a recent decline in their functional status and demonstrates the ability to make significant improvements in function in a reasonable and predictable amount of time.     Precautions / Restrictions Precautions Precautions: Fall Precaution Comments: Lt neph tube      Mobility  Bed Mobility Overal bed mobility: Needs Assistance Bed Mobility: Supine to Sit     Supine to sit: Total assist     General bed mobility comments: pt not following commands or cues and required total assist    Transfers Overall transfer level: Needs assistance   Transfers: Sit to/from Stand Sit to Stand: Max assist, +2 safety/equipment, +2 physical assistance           General transfer comment: max assist to stand and utilize stedy, stedy for OOB to recliner, pt moaning during entire standing and perched on folding seat with stedy but not needing knee support/block (did keep knees in flexion) Transfer via Lift Equipment: Stedy  Ambulation/Gait                  Stairs            Wheelchair Mobility    Modified Rankin (Stroke Patients Only)       Balance Overall balance assessment: Needs assistance Sitting-balance support: No upper extremity supported, Feet supported Sitting balance-Leahy Scale: Fair Sitting balance - Comments: static fair once EOB  Pertinent Vitals/Pain Pain Assessment Pain Assessment: Faces Faces Pain Scale: Hurts even more Pain Location: LEs Pain Descriptors / Indicators: Guarding, Moaning Pain  Intervention(s): Repositioned, Premedicated before session, Monitored during session    Home Living Family/patient expects to be discharged to:: Private residence Living Arrangements: Other (Comment) (sister [name])   Type of Home: House Home Access: Level entry     Alternate Level Stairs-Number of Steps: flight Home Layout: Two level Home Equipment: None      Prior Function Prior Level of Function : Independent/Modified Independent             Mobility Comments: driving, shopping       Hand Dominance        Extremity/Trunk Assessment        Lower Extremity Assessment Lower Extremity Assessment: Generalized weakness;Difficult to assess due to impaired cognition    Cervical / Trunk Assessment Cervical / Trunk Assessment: Normal  Communication   Communication: No difficulties;Other (comment) (typically no difficulties however mostly nonverbal, moaning during session)  Cognition Arousal/Alertness: Awake/alert Behavior During Therapy: Flat affect Overall Cognitive Status: Impaired/Different from baseline Area of Impairment: Following commands                               General Comments: pt not communicating verbally or nonverbally, pt mostly moaning during session, appears in pain with touch or use of LEs; sister [name] present and reports pt independent at baseline        General Comments      Exercises     Assessment/Plan    PT Assessment Patient needs continued PT services  PT Problem List Decreased strength;Decreased activity tolerance;Decreased balance;Decreased mobility;Pain;Decreased cognition;Decreased knowledge of use of DME;Decreased safety awareness       PT Treatment Interventions Gait training;DME instruction;Therapeutic exercise;Balance training;Functional mobility training;Therapeutic activities;Patient/family education    PT Goals (Current goals can be found in the Care Plan section)  Acute Rehab PT Goals PT Goal  Formulation: With patient/family Time For Goal Achievement: 12/17/22 Potential to Achieve Goals: Fair    Frequency Min 1X/week     Co-evaluation PT/OT/SLP Co-Evaluation/Treatment: Yes Reason for Co-Treatment: Necessary to address cognition/behavior during functional activity;For patient/therapist safety;To address functional/ADL transfers PT goals addressed during session: Mobility/safety with mobility OT goals addressed during session: ADL's and self-care       AM-PAC PT "6 Clicks" Mobility  Outcome Measure Help needed turning from your back to your side while in a flat bed without using bedrails?: Total Help needed moving from lying on your back to sitting on the side of a flat bed without using bedrails?: Total Help needed moving to and from a bed to a chair (including a wheelchair)?: Total Help needed standing up from a chair using your arms (e.g., wheelchair or bedside chair)?: Total Help needed to walk in hospital room?: Total Help needed climbing 3-5 steps with a railing? : Total 6 Click Score: 6    End of Session Equipment Utilized During Treatment: Gait belt Activity Tolerance: Other (comment) (limited by cognition) Patient left: with call bell/phone within reach;in chair;with chair alarm set;with family/visitor present Nurse Communication: Mobility status;Need for lift equipment PT Visit Diagnosis: Other abnormalities of gait and mobility (R26.89)    Time: 4098-1191 PT Time Calculation (min) (ACUTE ONLY): 16 min   Charges:   PT Evaluation $PT Eval Low Complexity: 1 Low         Kati PT, DPT Physical Therapist Acute Rehabilitation Services  Office: [phone]   Carlyon Prows 12/03/2022, 12:47 PM

## 2022-12-03 NOTE — Progress Notes (Signed)
Pt confused and easily agitated. RN and family (sister) do not believe that the pt will do well for the ordered MRI. MRI will reach out to nursing staff on the morning of 12/04/22 to assess the condition of the pt and see if she will be able to come down for her MRI.

## 2022-12-03 NOTE — Plan of Care (Signed)
  Problem: Self-Concept: Goal: Level of anxiety will decrease Outcome: Progressing   Problem: Skin Integrity: Goal: Risk for impaired skin integrity will decrease Outcome: Progressing   Problem: Metabolic: Goal: Ability to maintain appropriate glucose levels will improve Outcome: Progressing   Problem: Coping: Goal: Ability to adjust to condition or change in health will improve Outcome: Progressing   Problem: Safety: Goal: Ability to remain free from injury will improve Outcome: Progressing

## 2022-12-03 NOTE — Progress Notes (Signed)
Patient has been intermittently confused and trying to get out of bed. She is calm and resting at the moment, but as soon as she is moved she starts to yell out and move around in the bed. She has been having muscle cramps in her legs according to her sister, [name], who is at the bedside. This RN tried to give her a muscle relaxer to see if that would help, but patient will not take anything by mouth at this time. Sister agrees that she does not think the patient will do well if she goes down for the MRI now.

## 2022-12-03 NOTE — Progress Notes (Signed)
This RN and 2 other nurses attempted to get patient up to [facility], however, patient's legs were too weak. Patient started to stand but she immediately went back down to the bed.

## 2022-12-03 NOTE — Progress Notes (Signed)
PROGRESS NOTE    Jaime Gomez  ZOX:[id number] DOB: September 11, 1965 DOA: 11/28/2022 PCP: Staci Acosta, FNP   Brief Narrative:  57 y.o. 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 The patient has had placement of a left sided nephrostomy tube which was due for replacement on 11/29/2022.  Patient admitted for sepsis secondary to urinary tract infection.  IR consulted for nephrostomy tube change.  Patient tolerated the procedure well.  Due to anemia, 2 units PRBC was given with appropriate response.  Hospital course complicated by acute kidney injury which is slowly improving.   Assessment & Plan:  Principal Problem:   Sepsis secondary to UTI [facility]) Active Problems:   Endometrial cancer, FIGO stage IVB (HCC)   Leg DVT (deep venous thromboembolism), chronic, right (HCC)   Acute renal failure superimposed on stage 3a chronic kidney disease (HCC)   Essential (primary) hypertension   Type 2 diabetes mellitus without complications (HCC)   Abnormality of lung on CXR   Metabolic acidosis with normal anion gap and failure of bicarbonate regeneration   Major depressive disorder, single episode, moderate (HCC)     Assessment and Plan: * Sepsis secondary to UTI (HCC) Pseudomonas bacteremia Sepsis physiology stable for now.  Plans for nephrostomy tube change by IR 5/8.  Blood cultures growing Pseudomonas, on cefepime. Infectious disease recommending Chemo-Port removal, Dr. Myna Hidalgo and patient/family no agreeable.   Extensively discussed standard of care with the patient and her sister at bedside regarding port management/removal in the setting of bacteremia.  Especially while this port is not being used actively.  Infectious diseases in agreement.  Acute renal failure superimposed on stage 3a  chronic kidney disease (HCC) Metabolic acidosis Baseline creatinine 2.6, admission creatinine 4.9 > 5.15 > 3.85 Change fluids to bicarb as patient not taking p.o. bicarb.  Discussed with Dr. Valentino Nose from nephrology Check lactic acid and VBG Renal ultrasound-chronic disease Bladder scan  Metabolic encephalopathy - Multifactorial-combination of not getting bowel rest, underlying infection, uremia, pain medication.  CT of the head does not show any evidence of acute pathology.  Continue close monitoring.  No focal neurodeficits.  TSH, ammonia, B12  Hypomagnesemia/hypokalemia - As needed repletion   Leg DVT (deep venous thromboembolism), chronic, right (HCC) The patient is on arixtra chronically.  Currently Arixtra is not a good option given renal function.  Reviewed risk of underdosing or overdosing. Hep drip, as renal fx continues to improve.   Dr Myna Hidalgo consulted, prefers to use low-dose Arixtra despite renal function.  Recommends "low-dose" but I am not sure what this low doses as it is not recommended/standard of care at this level of renal function.  Patient and family understands this. If this continues to be oncology recommendation, will defer the dosing to their service.  Depression - Mirtazapine bedtime.  Psychiatry consulted   Abnormality of lung on CXR The patient has persistent bilateral perihilar patchy opacities on CXR. They have previously been present on CXR of 08/2021 and PET-CT of 08/21/2022. Monitor. No current symptoms of respiratory illness.  Anemia of Chronic Dz Baseline Hb 9.5, slowly hemoglobin drifted down to 7.1.  Routinely gets outpatient transfusion.  Received 2 units PRBC 5/8.  Hemoglobin stable 9.0  Type 2 diabetes mellitus without complications (HCC) Peripheral neuropathy -Sliding scale and Accu-Chek. Gabapentin (renally dosed) Stop levemir until PO intake improves  Essential (primary) hypertension On Coreg, IV as needed  Chronic CVA - New since  February.  Discussed with patient and family regarding conservative management as this is now chronic.    Endometrial cancer, FIGO stage IVB (HCC) On outpatient chemotherapy  DVT prophylaxis: Heparin drip Code Status: Full Family Communication: Sister present at bedside Status is: Inpatient Ongoing evaluation for urinary tract infection, anemia and acute kidney injury       Diet Orders (From admission, onward)     Start     Ordered   11/29/22 0956  Diet Carb Modified Fluid consistency: Thin; Room service appropriate? Yes  Diet effective now       Question Answer Comment  Diet-HS Snack? Nothing   Calorie Level Medium 1600-2000   Fluid consistency: Thin   Room service appropriate? Yes      11/29/22 0955            Subjective: Yesterday evening and this morning patient has been having pain which she explains is neuropathy pain for which she received Dilaudid.  Seen at bedside she is not too talkative today.  Vital signs are overall stable.  Sisters present at bedside.  On physical therapy try to get her up but she reported of generalized discomfort and was moaning.  Examination: Constitutional: Not in acute distress, chronically ill-appearing Respiratory: Clear to auscultation bilaterally Cardiovascular: Normal sinus rhythm, no rubs Abdomen: Nontender nondistended good bowel sounds Musculoskeletal: No edema noted Skin: No rashes seen Neurologic: CN 2-12 grossly intact.  And nonfocal Psychiatric: Alert to name, flat affect  Chemo-Port in the right chest wall site looks okay Objective: Vitals:   12/02/22 0832 12/02/22 1440 12/02/22 2121 12/03/22 0430  BP: 135/77 (!) 143/77 (!) 161/99 (!) 168/100  Pulse: 67 63 88 96  Resp: 20 (!) 22 20 20   Temp: 98.6 F (37 C) (!) 97.5 F (36.4 C) 98.6 F (37 C) 98.1 F (36.7 C)  TempSrc: Oral Oral Oral Oral  SpO2: 100% 99% 98% 99%  Weight:      Height:        Intake/Output Summary (Last 24 hours) at 12/03/2022 0751 Last  data filed at 12/03/2022 6213 Gross per 24 hour  Intake 1314.78 ml  Output 3025 ml  Net -1710.22 ml   Filed Weights   11/28/22 0819  Weight: 60.8 kg    Scheduled Meds:  acetaminophen  1,000 mg Oral Once   carvedilol  12.5 mg Oral BID WC   Chlorhexidine Gluconate Cloth  6 each Topical Daily   folic acid  1 mg Oral Q0600   gabapentin  100 mg Oral BID   insulin aspart  0-9 Units Subcutaneous TID WC   insulin detemir  5 Units Subcutaneous QHS   mirtazapine  7.5 mg Oral QHS   sodium bicarbonate  650 mg Oral TID   Continuous Infusions:  sodium chloride 75 mL/hr at 12/02/22 2312   ceFEPime (MAXIPIME) IV 2 g (12/02/22 1046)   heparin 750 Units/hr (12/03/22 0456)    Nutritional status     Body mass index is 22.3 kg/m.  Data Reviewed:   CBC: Recent Labs  Lab 11/28/22 0844 11/29/22 0403 11/30/22 0315 12/01/22 0059 12/02/22 0416 12/03/22 0255  WBC 4.0 3.4* 3.9* 3.7* 5.9 4.7  NEUTROABS 3.3  --   --   --  4.6  --   HGB 7.3* 7.1* 9.0* 9.6* 9.8* 9.8*  HCT 22.1* 22.5* 27.0* 28.6* 30.0* 30.6*  MCV 81.3 85.6 87.4 87.5 87.5 90.0  PLT 120*  122* 125* 151 195 181   Basic Metabolic Panel: Recent Labs  Lab 11/29/22 0403 11/30/22 0315 12/01/22 0059 12/02/22 0416 12/03/22 0255  NA 130* 130* 129* 137 132*  K 3.4* 3.9 4.0 3.4* 3.5  CL 101 103 102 109 116*  CO2 17* 15* 16* 14* 13*  GLUCOSE 84 115* 256* 59* 98  BUN 80* 80* 78* 80* 71*  CREATININE 5.15* 4.81* 4.67* 4.32* 3.85*  CALCIUM 8.0* 7.6* 7.5* 8.0* 7.4*  MG  --  1.5* 2.3 2.1 2.0   GFR: Estimated Creatinine Clearance: 14.3 mL/min (A) (by C-G formula based on SCr of 3.85 mg/dL (H)). Liver Function Tests: Recent Labs  Lab 11/28/22 0844 11/29/22 0403  AST 23 76*  ALT 8 11  ALKPHOS 100 82  BILITOT 0.4 0.5  PROT 6.5 6.3*  ALBUMIN 2.4* 2.3*   No results for input(s): "LIPASE", "AMYLASE" in the last 168 hours. No results for input(s): "AMMONIA" in the last 168 hours. Coagulation Profile: Recent Labs  Lab  11/28/22 0844 11/30/22 1204  INR 1.2 1.1   Cardiac Enzymes: No results for input(s): "CKTOTAL", "CKMB", "CKMBINDEX", "TROPONINI" in the last 168 hours. BNP (last 3 results) No results for input(s): "PROBNP" in the last 8760 hours. HbA1C: No results for input(s): "HGBA1C" in the last 72 hours.  CBG: Recent Labs  Lab 12/02/22 1228 12/02/22 1502 12/02/22 2146 12/03/22 0235 12/03/22 0728  GLUCAP 121* 114* 90 101* 88   Lipid Profile: No results for input(s): "CHOL", "HDL", "LDLCALC", "TRIG", "CHOLHDL", "LDLDIRECT" in the last 72 hours. Thyroid Function Tests: No results for input(s): "TSH", "T4TOTAL", "FREET4", "T3FREE", "THYROIDAB" in the last 72 hours. Anemia Panel: No results for input(s): "VITAMINB12", "FOLATE", "FERRITIN", "TIBC", "IRON", "RETICCTPCT" in the last 72 hours. Sepsis Labs: Recent Labs  Lab 11/28/22 0844  LATICACIDVEN 1.3    Recent Results (from the past 240 hour(s))  Resp panel by RT-PCR (RSV, Flu A&B, Covid) Anterior Nasal Swab     Status: None   Collection Time: 11/28/22  8:44 AM   Specimen: Anterior Nasal Swab  Result Value Ref Range Status   SARS Coronavirus 2 by RT PCR NEGATIVE NEGATIVE Final    Comment: (NOTE) SARS-CoV-2 target nucleic acids are NOT DETECTED.  The SARS-CoV-2 RNA is generally detectable in upper respiratory specimens during the acute phase of infection. The lowest concentration of SARS-CoV-2 viral copies this assay can detect is 138 copies/mL. A negative result does not preclude SARS-Cov-2 infection and should not be used as the sole basis for treatment or other patient management decisions. A negative result may occur with  improper specimen collection/handling, submission of specimen other than nasopharyngeal swab, presence of viral mutation(s) within the areas targeted by this assay, and inadequate number of viral copies(<138 copies/mL). A negative result must be combined with clinical observations, patient history, and  epidemiological information. The expected result is Negative.  Fact Sheet for Patients:  BloggerCourse.com  Fact Sheet for Healthcare Providers:  SeriousBroker.it  This test is no t yet approved or cleared by the [place] FDA and  has been authorized for detection and/or diagnosis of SARS-CoV-2 by FDA under an Emergency Use Authorization (EUA). This EUA will remain  in effect (meaning this test can be used) for the duration of the COVID-19 declaration under Section 564(b)(1) of the Act, 21 U.S.C.section 360bbb-3(b)(1), unless the authorization is terminated  or revoked sooner.       Influenza A by PCR NEGATIVE NEGATIVE Final   Influenza B by PCR NEGATIVE NEGATIVE Final  Comment: (NOTE) The Xpert Xpress SARS-CoV-2/FLU/RSV plus assay is intended as an aid in the diagnosis of influenza from Nasopharyngeal swab specimens and should not be used as a sole basis for treatment. Nasal washings and aspirates are unacceptable for Xpert Xpress SARS-CoV-2/FLU/RSV testing.  Fact Sheet for Patients: BloggerCourse.com  Fact Sheet for Healthcare Providers: SeriousBroker.it  This test is not yet approved or cleared by the [place] FDA and has been authorized for detection and/or diagnosis of SARS-CoV-2 by FDA under an Emergency Use Authorization (EUA). This EUA will remain in effect (meaning this test can be used) for the duration of the COVID-19 declaration under Section 564(b)(1) of the Act, 21 U.S.C. section 360bbb-3(b)(1), unless the authorization is terminated or revoked.     Resp Syncytial Virus by PCR NEGATIVE NEGATIVE Final    Comment: (NOTE) Fact Sheet for Patients: BloggerCourse.com  Fact Sheet for Healthcare Providers: SeriousBroker.it  This test is not yet approved or cleared by the [place] FDA and has been  authorized for detection and/or diagnosis of SARS-CoV-2 by FDA under an Emergency Use Authorization (EUA). This EUA will remain in effect (meaning this test can be used) for the duration of the COVID-19 declaration under Section 564(b)(1) of the Act, 21 U.S.C. section 360bbb-3(b)(1), unless the authorization is terminated or revoked.  Performed at [facility], [address] [place], [place] [address]   Blood Culture (routine x 2)     Status: Abnormal   Collection Time: 11/28/22  8:44 AM   Specimen: BLOOD  Result Value Ref Range Status   Specimen Description   Final    BLOOD BLOOD RIGHT FOREARM Performed at [facility], [address] [place], [place] [address]    Special Requests   Final    Blood Culture results may not be optimal due to an excessive volume of blood received in culture bottles BOTTLES DRAWN AEROBIC AND ANAEROBIC Performed at [facility], [address] [place], [place] [address]    Culture  Setup Time   Final    GRAM NEGATIVE RODS ANAEROBIC BOTTLE ONLY CRITICAL RESULT CALLED TO, READ BACK BY AND VERIFIED WITH: L. Loletta Specter PHARMD, AT 1351 11/29/22 Renato Shin Performed at [facility], [address], [place], [place] [address]    Culture PSEUDOMONAS AERUGINOSA (A)  Final   Report Status 12/01/2022 FINAL  Final   Organism ID, Bacteria PSEUDOMONAS AERUGINOSA  Final      Susceptibility   Pseudomonas aeruginosa - MIC*    CEFTAZIDIME <=1 SENSITIVE Sensitive     CIPROFLOXACIN 0.5 SENSITIVE Sensitive     GENTAMICIN <=1 SENSITIVE Sensitive     IMIPENEM <=0.25 SENSITIVE Sensitive     PIP/TAZO <=4 SENSITIVE Sensitive     CEFEPIME 2 SENSITIVE Sensitive     * PSEUDOMONAS AERUGINOSA  Urine Culture     Status: Abnormal   Collection Time: 11/28/22  8:44 AM   Specimen: Urine, Random  Result Value Ref Range Status   Specimen Description   Final    URINE, RANDOM Performed at [facility], [address] [place], [place] [address]    Special Requests   Final    NONE Reflexed from [phone] Performed at [facility], [address] [place], [place] [address]    Culture (A)  Final    >=100,000 COLONIES/mL LACTOBACILLUS SPECIES Standardized susceptibility testing for this organism is not available. Performed at [facility],  [address], [place], [place] [address]    Report Status 11/29/2022 FINAL  Final  Blood Culture ID Panel (Reflexed)     Status: Abnormal   Collection Time: 11/28/22  8:44 AM  Result Value Ref Range Status   Enterococcus faecalis NOT DETECTED NOT DETECTED Final   Enterococcus Faecium NOT DETECTED NOT DETECTED Final   Listeria monocytogenes NOT DETECTED NOT DETECTED Final   Staphylococcus species NOT DETECTED NOT DETECTED Final   Staphylococcus aureus (BCID) NOT DETECTED NOT DETECTED Final   Staphylococcus epidermidis NOT DETECTED NOT DETECTED Final   Staphylococcus lugdunensis NOT DETECTED NOT DETECTED Final   Streptococcus species NOT DETECTED NOT DETECTED Final   Streptococcus agalactiae NOT DETECTED NOT DETECTED Final   Streptococcus pneumoniae NOT DETECTED NOT DETECTED Final   Streptococcus pyogenes NOT DETECTED NOT DETECTED Final   A.calcoaceticus-baumannii NOT DETECTED NOT DETECTED Final   Bacteroides fragilis NOT DETECTED NOT DETECTED Final   Enterobacterales NOT DETECTED NOT DETECTED Final   Enterobacter cloacae complex NOT DETECTED NOT DETECTED Final   Escherichia coli NOT DETECTED NOT DETECTED Final   Klebsiella aerogenes NOT DETECTED NOT DETECTED Final   Klebsiella oxytoca NOT DETECTED NOT DETECTED Final   Klebsiella pneumoniae NOT DETECTED NOT DETECTED Final   Proteus species NOT DETECTED NOT DETECTED Final   Salmonella species NOT DETECTED NOT DETECTED Final   Serratia marcescens NOT DETECTED NOT DETECTED Final   Haemophilus influenzae NOT DETECTED NOT DETECTED Final   Neisseria meningitidis NOT DETECTED NOT DETECTED Final   Pseudomonas  aeruginosa DETECTED (A) NOT DETECTED Final    Comment: CRITICAL RESULT CALLED TO, READ BACK BY AND VERIFIED WITH: L. POINDEXTER PHARMD, AT 1351 11/29/22 D. VANHOOK    Stenotrophomonas maltophilia NOT DETECTED NOT DETECTED Final   Candida albicans NOT DETECTED NOT DETECTED Final   Candida auris NOT DETECTED NOT DETECTED Final   Candida glabrata NOT DETECTED NOT DETECTED Final   Candida krusei NOT DETECTED NOT DETECTED Final   Candida parapsilosis NOT DETECTED NOT DETECTED Final   Candida tropicalis NOT DETECTED NOT DETECTED Final   Cryptococcus neoformans/gattii NOT DETECTED NOT DETECTED Final   CTX-M ESBL NOT DETECTED NOT DETECTED Final   Carbapenem resistance IMP NOT DETECTED NOT DETECTED Final   Carbapenem resistance KPC NOT DETECTED NOT DETECTED Final   Carbapenem resistance NDM NOT DETECTED NOT DETECTED Final   Carbapenem resistance VIM NOT DETECTED NOT DETECTED Final    Comment: Performed at [facility], [address] [place], [place] [address]  Blood Culture (routine x 2)     Status: None (Preliminary result)   Collection Time: 11/28/22  9:02 AM   Specimen: BLOOD  Result Value Ref Range Status   Specimen Description   Final    BLOOD RIGHT ANTECUBITAL Performed at [facility], [address] [place], [place] [address]    Special Requests   Final    Blood Culture adequate volume BOTTLES DRAWN AEROBIC AND ANAEROBIC Performed at [facility], [address] [place], [place] [address]    Culture   Final    NO GROWTH 4 DAYS Performed at [facility], [address] [place], [place] [address]    Report Status PENDING  Incomplete  Culture, blood (Routine X 2) w Reflex to ID Panel     Status: None (Preliminary result)   Collection Time: 11/30/22  6:25 PM   Specimen: BLOOD RIGHT ARM  Result Value Ref Range Status   Specimen Description  Final    BLOOD RIGHT ARM Performed at [facility], [address], [place], [place] [address]     Special Requests   Final    BOTTLES DRAWN AEROBIC ONLY Blood Culture adequate volume Performed at [facility], [address], [place], [place] [address]    Culture   Final    NO GROWTH 2 DAYS Performed at [facility], [address], [place], [place] [address]    Report Status PENDING  Incomplete  Culture, blood (Routine X 2) w Reflex to ID Panel     Status: None (Preliminary result)   Collection Time: 11/30/22  6:30 PM   Specimen: BLOOD LEFT HAND  Result Value Ref Range Status   Specimen Description   Final    BLOOD LEFT HAND Performed at [facility], [address], [place], [place] [address]    Special Requests   Final    BOTTLES DRAWN AEROBIC ONLY Blood Culture adequate volume Performed at [facility], [address] [place], [place] [address]    Culture   Final    NO GROWTH 2 DAYS Performed at [facility], [address] [place], [place] [address]    Report Status PENDING  Incomplete         Radiology Studies: No results found.         LOS: 5 days   Time spent= 35 mins    Fuller Makin Joline Maxcy, MD [facility]  If 7PM-7AM, please contact night-coverage  12/03/2022, 7:51 AM

## 2022-12-03 NOTE — Evaluation (Signed)
Occupational Therapy Evaluation Patient Details Name: Jaime Gomez MRN: [id number] DOB: Dec 10, 1965 Today's Date: 12/03/2022   History of Present Illness Patient is a 57 year old female who presented on 5/7 with left side nephrostomy tube that was due to be replaced on 5/8. Patient was admitted with sepsis,and UTI. Patient was noted to have nephrostomy tube changed with anemia post op with 2 units of PRBCs. PMH:  endometrial cancer , CKD , chronic iron deficiency anemia, Hypertension, DM II, RLE DVT.   Clinical Impression   Patient is a 57 year old female who was admitted for above. Patient was independent in Adls prior to hospitalization per sister report. Patient currently, non communicative with consistent moaning during session. MD and patients sister present in room. Patient moaning with all touch but able to sit EOB with min guard during session. Unable to follow commands to participate in ADLs. Patient was noted to have decreased functional activity tolernace, decreased ROM, decreased BUE strength, decreased endurance, decreased sitting balance, decreased standing balanced, decreased safety awareness, increased pain, and decreased knowledge of AE/AD impacting participation in ADLs. Patient would continue to benefit from skilled OT services at this time while admitted and after d/c to address noted deficits in order to improve overall safety and independence in ADLs.        Recommendations for follow up therapy are one component of a multi-disciplinary discharge planning process, led by the attending physician.  Recommendations may be updated based on patient status, additional functional criteria and insurance authorization.   Assistance Recommended at Discharge Frequent or constant Supervision/Assistance  Patient can return home with the following Two people to help with walking and/or transfers;A lot of help with bathing/dressing/bathroom;Assistance with cooking/housework;Direct  supervision/assist for medications management;Assist for transportation;Direct supervision/assist for financial management;Help with stairs or ramp for entrance    Functional Status Assessment  Patient has had a recent decline in their functional status and demonstrates the ability to make significant improvements in function in a reasonable and predictable amount of time.  Equipment Recommendations  None recommended by OT       Precautions / Restrictions Precautions Precautions: Fall Precaution Comments: Lt neph tube Restrictions Weight Bearing Restrictions: No      Mobility Bed Mobility Overal bed mobility: Needs Assistance Bed Mobility: Supine to Sit           General bed mobility comments: pt not following commands or cues and required total assist    Transfers Overall transfer level: Needs assistance   Transfers: Sit to/from Stand Sit to Stand: Max assist, +2 safety/equipment, +2 physical assistance           General transfer comment: max assist to stand and utilize stedy, stedy for OOB to recliner, pt moaning during entire standing and perched on folding seat with stedy but not needing knee support/block (did keep knees in flexion) Transfer via Lift Equipment: Stedy    Balance Overall balance assessment: Needs assistance Sitting-balance support: No upper extremity supported, Feet supported Sitting balance-Leahy Scale: Fair Sitting balance - Comments: static fair once EOB         ADL either performed or assessed with clinical judgement   ADL Overall ADL's : Needs assistance/impaired Eating/Feeding: Maximal assistance;Bed level   Grooming: Bed level;Total assistance   Upper Body Bathing: Bed level;Total assistance   Lower Body Bathing: Bed level;Total assistance   Upper Body Dressing : Bed level;Total assistance   Lower Body Dressing: Bed level;Total assistance   Toilet Transfer: +2 for physical assistance;+2 for safety/equipment  Toilet Transfer  Details (indicate cue type and reason): with STEDY wtih +3 present Toileting- Clothing Manipulation and Hygiene: Bed level;Total assistance   Tub/ Shower Transfer: +2 for physical assistance;+2 for safety/equipment           Vision   Additional Comments: unable to assess with patient keeping eyes closed for majoiry of session            Pertinent Vitals/Pain Pain Assessment Pain Assessment: Faces Faces Pain Scale: Hurts even more Pain Location: LEs and BUE with touch Pain Descriptors / Indicators: Guarding, Moaning Pain Intervention(s): Limited activity within patient's tolerance, Monitored during session, Repositioned        Extremity/Trunk Assessment Upper Extremity Assessment Upper Extremity Assessment: Difficult to assess due to impaired cognition (patient appeared to have pain with touch to BLE and BUE not able to follow commands for ROM or MMT testing)   Lower Extremity Assessment Lower Extremity Assessment: Defer to PT evaluation   Cervical / Trunk Assessment Cervical / Trunk Assessment: Normal   Communication Communication Communication: Other (comment) (patient was non verbal and moaning during session)   Cognition Arousal/Alertness: Awake/alert Behavior During Therapy: Flat affect Overall Cognitive Status: Impaired/Different from baseline Area of Impairment: Following commands       General Comments: patient was not communicative verbally or non verbally. patient moaning during session but not fighting. patients sister and Dr. Nelson Chimes present during session.                Home Living Family/patient expects to be discharged to:: Private residence Living Arrangements: Other (Comment) (her sister [name]) Available Help at Discharge: Family Type of Home: House Home Access: Level entry     Home Layout: Two level Alternate Level Stairs-Number of Steps: flight Alternate Level Stairs-Rails: Right Bathroom Shower/Tub: Producer, television/film/video:  Standard     Home Equipment: None          Prior Functioning/Environment Prior Level of Function : Independent/Modified Independent             Mobility Comments: driving, shopping          OT Problem List: Decreased strength;Decreased activity tolerance;Impaired balance (sitting and/or standing);Decreased range of motion;Decreased coordination;Decreased safety awareness;Impaired sensation;Decreased knowledge of precautions;Pain;Impaired UE functional use;Decreased knowledge of use of DME or AE;Decreased cognition;Cardiopulmonary status limiting activity      OT Treatment/Interventions: Self-care/ADL training;Energy conservation;Therapeutic exercise;DME and/or AE instruction;Patient/family education;Therapeutic activities;Balance training    OT Goals(Current goals can be found in the care plan section) Acute Rehab OT Goals Patient Stated Goal: none stated OT Goal Formulation: With family Time For Goal Achievement: 12/17/22 Potential to Achieve Goals: Fair  OT Frequency: Min 1X/week    Co-evaluation PT/OT/SLP Co-Evaluation/Treatment: Yes Reason for Co-Treatment: Necessary to address cognition/behavior during functional activity;For patient/therapist safety;To address functional/ADL transfers PT goals addressed during session: Mobility/safety with mobility OT goals addressed during session: ADL's and self-care      AM-PAC OT "6 Clicks" Daily Activity     Outcome Measure Help from another person eating meals?: Total Help from another person taking care of personal grooming?: Total Help from another person toileting, which includes using toliet, bedpan, or urinal?: Total Help from another person bathing (including washing, rinsing, drying)?: Total Help from another person to put on and taking off regular upper body clothing?: Total Help from another person to put on and taking off regular lower body clothing?: Total 6 Click Score: 6   End of Session Equipment Utilized  During Treatment: Gait belt;Other (  comment) (STEDY) Nurse Communication: Mobility status  Activity Tolerance: Patient limited by pain Patient left: in chair;with call bell/phone within reach;with chair alarm set  OT Visit Diagnosis: Unsteadiness on feet (R26.81);Other abnormalities of gait and mobility (R26.89)                Time: 1006-1030 OT Time Calculation (min): 24 min Charges:  OT General Charges $OT Visit: 1 Visit OT Evaluation $OT Eval Moderate Complexity: 1 Mod  Jaleiah Asay OTR/L, MS Acute Rehabilitation Department Office# [phone]   Selinda Flavin 12/03/2022, 1:01 PM

## 2022-12-04 ENCOUNTER — Inpatient Hospital Stay (HOSPITAL_COMMUNITY): Payer: 59

## 2022-12-04 ENCOUNTER — Ambulatory Visit
Admission: RE | Admit: 2022-12-04 | Discharge: 2022-12-04 | Disposition: A | Discharge status: Home / Self Care | Payer: 59 | Source: Ambulatory Visit | Attending: Radiation Oncology | Admitting: Radiation Oncology

## 2022-12-04 ENCOUNTER — Inpatient Hospital Stay (HOSPITAL_COMMUNITY)
Admit: 2022-12-04 | Discharge: 2022-12-04 | Disposition: A | Discharge status: Home / Self Care | Payer: 59 | Attending: Internal Medicine | Admitting: Internal Medicine

## 2022-12-04 ENCOUNTER — Other Ambulatory Visit: Payer: Self-pay | Admitting: Hematology & Oncology

## 2022-12-04 DIAGNOSIS — N390 Urinary tract infection, site not specified: Secondary | ICD-10-CM | POA: Diagnosis not present

## 2022-12-04 DIAGNOSIS — C7951 Secondary malignant neoplasm of bone: Secondary | ICD-10-CM

## 2022-12-04 DIAGNOSIS — R410 Disorientation, unspecified: Secondary | ICD-10-CM | POA: Diagnosis not present

## 2022-12-04 DIAGNOSIS — I639 Cerebral infarction, unspecified: Secondary | ICD-10-CM | POA: Diagnosis not present

## 2022-12-04 DIAGNOSIS — A419 Sepsis, unspecified organism: Secondary | ICD-10-CM | POA: Diagnosis not present

## 2022-12-04 DIAGNOSIS — R4182 Altered mental status, unspecified: Secondary | ICD-10-CM | POA: Diagnosis not present

## 2022-12-04 DIAGNOSIS — R7881 Bacteremia: Secondary | ICD-10-CM | POA: Diagnosis not present

## 2022-12-04 DIAGNOSIS — R569 Unspecified convulsions: Secondary | ICD-10-CM | POA: Diagnosis not present

## 2022-12-04 DIAGNOSIS — G9340 Encephalopathy, unspecified: Secondary | ICD-10-CM

## 2022-12-04 DIAGNOSIS — N19 Unspecified kidney failure: Secondary | ICD-10-CM | POA: Diagnosis not present

## 2022-12-04 DIAGNOSIS — A4152 Sepsis due to Pseudomonas: Secondary | ICD-10-CM | POA: Diagnosis not present

## 2022-12-04 DIAGNOSIS — C55 Malignant neoplasm of uterus, part unspecified: Secondary | ICD-10-CM | POA: Diagnosis not present

## 2022-12-04 DIAGNOSIS — C541 Malignant neoplasm of endometrium: Secondary | ICD-10-CM

## 2022-12-04 HISTORY — PX: IR REMOVAL TUN ACCESS W/ PORT W/O FL MOD SED: IMG2290

## 2022-12-04 LAB — BASIC METABOLIC PANEL
Anion gap: 14 (ref 5–15)
BUN: 73 mg/dL — ABNORMAL HIGH (ref 6–20)
CO2: 15 mmol/L — ABNORMAL LOW (ref 22–32)
Calcium: 8.2 mg/dL — ABNORMAL LOW (ref 8.9–10.3)
Chloride: 110 mmol/L (ref 98–111)
Creatinine, Ser: 3.65 mg/dL — ABNORMAL HIGH (ref 0.44–1.00)
GFR, Estimated: 14 mL/min — ABNORMAL LOW (ref >60)
Glucose, Bld: 203 mg/dL — ABNORMAL HIGH (ref 70–99)
Potassium: 3.4 mmol/L — ABNORMAL LOW (ref 3.5–5.1)
Sodium: 139 mmol/L (ref 135–145)

## 2022-12-04 LAB — LIPID PANEL
Cholesterol: 162 mg/dL (ref 0–200)
HDL: 29 mg/dL — ABNORMAL LOW (ref >40)
LDL Cholesterol: 93 mg/dL (ref 0–99)
Total CHOL/HDL Ratio: 5.6 RATIO
Triglycerides: 199 mg/dL — ABNORMAL HIGH (ref <150)
VLDL: 40 mg/dL (ref 0–40)

## 2022-12-04 LAB — ECHOCARDIOGRAM COMPLETE
AR max vel: 2.9 cm2
AV Area VTI: 2.74 cm2
AV Area mean vel: 2.65 cm2
AV Mean grad: 3 mmHg
AV Peak grad: 6 mmHg
Ao pk vel: 1.22 m/s
Area-P 1/2: 3.77 cm2
Calc EF: 33.7 %
Height: 65 in
S' Lateral: 3.8 cm
Single Plane A2C EF: 19.3 %
Single Plane A4C EF: 42.9 %
Weight: 2144 oz

## 2022-12-04 LAB — GLUCOSE, CAPILLARY
Glucose-Capillary: 203 mg/dL — ABNORMAL HIGH (ref 70–99)
Glucose-Capillary: 155 mg/dL — ABNORMAL HIGH (ref 70–99)
Glucose-Capillary: 135 mg/dL — ABNORMAL HIGH (ref 70–99)
Glucose-Capillary: 125 mg/dL — ABNORMAL HIGH (ref 70–99)
Glucose-Capillary: 128 mg/dL — ABNORMAL HIGH (ref 70–99)

## 2022-12-04 LAB — MAGNESIUM: Magnesium: 1.8 mg/dL (ref 1.7–2.4)

## 2022-12-04 LAB — CBC
HCT: 30.9 % — ABNORMAL LOW (ref 36.0–46.0)
Hemoglobin: 9.9 g/dL — ABNORMAL LOW (ref 12.0–15.0)
MCH: 29 pg (ref 26.0–34.0)
MCHC: 32 g/dL (ref 30.0–36.0)
MCV: 90.6 fL (ref 80.0–100.0)
Platelets: 187 10*3/uL (ref 150–400)
RBC: 3.41 MIL/uL — ABNORMAL LOW (ref 3.87–5.11)
RDW: 16 % — ABNORMAL HIGH (ref 11.5–15.5)
WBC: 4.1 10*3/uL (ref 4.0–10.5)
nRBC: 0 % (ref 0.0–0.2)

## 2022-12-04 LAB — CULTURE, BLOOD (ROUTINE X 2): Culture: NO GROWTH

## 2022-12-04 LAB — HEPARIN LEVEL (UNFRACTIONATED): Heparin Unfractionated: 0.31 IU/mL (ref 0.30–0.70)

## 2022-12-04 MED ORDER — LIDOCAINE-EPINEPHRINE 1 %-1:100000 IJ SOLN
INTRAMUSCULAR | Status: AC
  Filled 2022-12-04: qty 1

## 2022-12-04 MED ORDER — LIDOCAINE-EPINEPHRINE 1 %-1:100000 IJ SOLN
20.0000 mL | Freq: Once | INTRAMUSCULAR | Status: AC
  Administered 2022-12-04: 10 mL via INTRADERMAL

## 2022-12-04 MED ORDER — BISACODYL 10 MG RE SUPP
10.0000 mg | Freq: Once | RECTAL | Status: AC
  Administered 2022-12-05: 10 mg via RECTAL
  Filled 2022-12-04: qty 1

## 2022-12-04 MED ORDER — STROKE: EARLY STAGES OF RECOVERY BOOK: Freq: Once | Status: AC

## 2022-12-04 MED ORDER — SODIUM CHLORIDE 0.9 % IV SOLN
1.0000 g | Freq: Two times a day (BID) | INTRAVENOUS | Status: DC
  Administered 2022-12-04 – 2022-12-12 (×17): 1 g via INTRAVENOUS
  Filled 2022-12-04 (×18): qty 20

## 2022-12-04 NOTE — NC FL2 (Cosign Needed Addendum)
[place] MEDICAID FL2 LEVEL OF CARE FORM     IDENTIFICATION  Patient Name: Briele Deahl Birthdate: 1966-03-25 Sex: female Admission Date (Current Location): 11/28/2022  [facility] and [place] Number:  Producer, television/film/video and Address:  [facility],  [address] [place], [place] [address]      Provider Number: [id number]  Attending Physician Name and Address:  Dimple Nanas, MD  Relative Name and Phone Number:  Jessie Habiger (sister) [phone] [facility])    Current Level of Care: Hospital Recommended Level of Care: Skilled Nursing Facility Prior Approval Number:    Date Approved/Denied:   PASRR Number:  [id number]  Discharge Plan: SNF    Current Diagnoses: Patient Active Problem List   Diagnosis Date Noted   Major depressive disorder, single episode, moderate (HCC) 12/02/2022   Sepsis secondary to UTI (HCC) 11/28/2022   Sepsis due to gram-negative UTI (HCC) 11/28/2022   Abnormality of lung on CXR 11/28/2022   Metabolic acidosis with normal anion gap and failure of bicarbonate regeneration 11/28/2022   IDA (iron deficiency anemia) 04/28/2022   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   Hyperkalemia 09/02/2021   Acute renal failure superimposed on stage 3a chronic kidney disease (HCC) 09/01/2021   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) 09/01/2021   Anemia due to antineoplastic chemotherapy (CODE) 09/01/2021   Chronic kidney disease, stage 3a (HCC) 09/01/2021   Essential (primary) hypertension 09/01/2021   Gastroparesis 09/01/2021   Personal history of other venous thrombosis and embolism 09/01/2021   Personal history of pulmonary embolism 09/01/2021   Type 2 diabetes mellitus without complications (HCC) 09/01/2021   Endometrial cancer, FIGO stage IVB (HCC) 08/18/2021   Goals of care, counseling/discussion 08/18/2021   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 failure 08/18/2021   Leg DVT (deep venous thromboembolism), chronic, right (HCC) 08/18/2021     Orientation RESPIRATION BLADDER Height & Weight     Self  Normal Urostomy (Urostomy pouch to gravity, left nephrostomy tube) Weight: 60.8 kg Height:  5\' 5"  (165.1 cm)  BEHAVIORAL SYMPTOMS/MOOD NEUROLOGICAL BOWEL NUTRITION STATUS      Continent Diet (carb modified)  AMBULATORY STATUS COMMUNICATION OF NEEDS Skin   Extensive Assist Verbally Other (Comment) (redness to bilateral buttocks)                       Personal Care Assistance Level of Assistance  Bathing, Feeding, Dressing Bathing Assistance: Maximum assistance Feeding assistance: Maximum assistance Dressing Assistance: Maximum assistance     Functional Limitations Info  Sight, Hearing, Speech Sight Info: Adequate Hearing Info: Adequate Speech Info: Adequate    SPECIAL CARE FACTORS FREQUENCY  PT (By licensed PT), OT (By licensed OT)     PT Frequency: 5x/wk OT Frequency: 5x/wk            Contractures Contractures Info: Present    Additional Factors Info  Code Status, Allergies, Psychotropic Code Status Info: Full Code Allergies Info: Taxotere (Docetaxel) Psychotropic Info: Remeron 7.5mg  po daily         Current Medications (12/04/2022):  This is the current hospital active medication list Current Facility-Administered Medications  Medication Dose Route Frequency Provider Last Rate Last Admin   acetaminophen (TYLENOL) tablet 650 mg  650 mg Oral Q6H PRN Swayze, Ava, DO   650 mg at 11/29/22 1737   Or   acetaminophen (TYLENOL) suppository 650 mg  650 mg Rectal Q6H PRN Swayze, Ava, DO       acetaminophen (TYLENOL) tablet 1,000 mg  1,000 mg Oral Once [place],  Alphonzo Lemmings, MD       carvedilol (COREG) tablet 12.5 mg  12.5 mg Oral BID WC Swayze, Ava, DO   12.5 mg at 12/03/22 0846   Chlorhexidine Gluconate Cloth 2 % PADS 6 each  6 each Topical Daily Dimple Nanas, MD   6 each at 12/04/22 1128   folic acid (FOLVITE) tablet 1 mg  1 mg Oral Q0600 Swayze, Ava, DO   1 mg at 12/03/22 0846   gabapentin  (NEURONTIN) capsule 100 mg  100 mg Oral BID Swayze, Ava, DO   100 mg at 12/03/22 0846   guaiFENesin (ROBITUSSIN) 100 MG/5ML liquid 5 mL  5 mL Oral Q4H PRN Amin, Ankit Chirag, MD       hydrALAZINE (APRESOLINE) injection 10 mg  10 mg Intravenous Q4H PRN Amin, Ankit Chirag, MD       HYDROmorphone (DILAUDID) injection 0.5 mg  0.5 mg Intravenous Q3H PRN Amin, Ankit Chirag, MD   0.5 mg at 12/04/22 0201   insulin aspart (novoLOG) injection 0-9 Units  0-9 Units Subcutaneous TID WC Amin, Ankit Chirag, MD   1 Units at 12/04/22 1129   ipratropium-albuterol (DUONEB) 0.5-2.5 (3) MG/3ML nebulizer solution 3 mL  3 mL Nebulization Q4H PRN Amin, Ankit Chirag, MD       labetalol (NORMODYNE) injection 5 mg  5 mg Intravenous Q6H Amin, Ankit Chirag, MD   5 mg at 12/04/22 1121   meropenem (MERREM) 1 g in sodium chloride 0.9 % 100 mL IVPB  1 g Intravenous Q12H Jamse Mead, RPH 200 mL/hr at 12/04/22 1118 1 g at 12/04/22 1118   methocarbamol (ROBAXIN) tablet 500 mg  500 mg Oral Q8H PRN Amin, Ankit Chirag, MD       metoprolol tartrate (LOPRESSOR) injection 5 mg  5 mg Intravenous Q4H PRN Amin, Ankit Chirag, MD       mirtazapine (REMERON) tablet 7.5 mg  7.5 mg Oral QHS Amin, Ankit Chirag, MD   7.5 mg at 12/02/22 1036   OLANZapine (ZYPREXA) injection 5 mg  5 mg Intramuscular Q6H PRN Amin, Ankit Chirag, MD   5 mg at 12/04/22 0029   ondansetron (ZOFRAN) tablet 4 mg  4 mg Oral Q6H PRN Swayze, Ava, DO       Or   ondansetron (ZOFRAN) injection 4 mg  4 mg Intravenous Q6H PRN Swayze, Ava, DO       oxyCODONE (Oxy IR/ROXICODONE) immediate release tablet 5 mg  5 mg Oral Q4H PRN Swayze, Ava, DO   5 mg at 12/03/22 1357   polyethylene glycol (MIRALAX / GLYCOLAX) packet 17 g  17 g Oral Daily PRN Amin, Ankit Chirag, MD       senna-docusate (Senokot-S) tablet 1 tablet  1 tablet Oral QHS PRN Swayze, Ava, DO       sodium bicarbonate tablet 650 mg  650 mg Oral TID Swayze, Ava, DO   650 mg at 12/03/22 0846   sorbitol 70 % solution 30 mL  30  mL Oral Daily PRN Swayze, Ava, DO         Discharge Medications: Please see discharge summary for a list of discharge medications.  Relevant Imaging Results:  Relevant Lab Results:   Additional Information SSN# [id number]  Howell Rucks, RN

## 2022-12-04 NOTE — Consult Note (Addendum)
Neurology Consultation Reason for Consult: Acute encephalopathy  Requesting Physician: Dr. Stephania Fragmin Hospitalist service  CC: Acute encephalopathy  History is obtained from: Patient's sister at bedside Jaime Gomez and chart review  HPI: Jaime Gomez is a 57 y.o. 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 She was [facility] for sepsis and found to have pseudomonas bacteremia secondary to her portacath vs urinary etiology. She has had increasing encephalopathy since her hospitalization and neurology has been consulted for further recommendations  History below per chart review and discussion with patient's sister Jaime Gomez who is present at bedside. The patient moved from [facility] a year ago, she was diagnosed with adenocarcinoma of the uterus and has been followed by local oncology since moving here. She has had nephrostomy tube placed in her left kidney after build of scar tissue from prior cancer treatments, unclear how frequently this was changed. Ms. Hajjar has had one prior hospitalization for sepsis from her port-cath site years ago where she was encephalopathic.   Per her sister Jaime Gomez, the patient was in her usual state of health until three weeks ago episode of garbled speech and inability to write down words, this  resolved after a few minutes. Denies any focal weakness during this episode. She was evaluated by her oncologist, was recommended to add a baby aspirin in event these were TIA's and she has been adherent to her arixtra. This did not reoccur. Denies any focal arm/leg weakness. Denies any facial droop. Her sister has noticed bilateral muscle contractures that occur randomly for the past few weeks.   Both of the  patient's sisters mention "staring spells" over last few months, where patient "zones out" --or just has some mild difficulty with her speech/word finding.  For the most part she responds after her name is called but few occasions this was delayed. Denies any confusion after these spells, loss of bowel or bladder dysfunction.  No other risk factors for seizure including prior history of seizure, family history of seizures, history of meningitis/encephalitis, history of significant head trauma or other intracranial process  Baseline able to perform ADL without assistance from others, still driving. However over the last few weeks/months patient's sister has noticed some memory lapses such as getting lost when driving to her appointment and financial difficulties; sister has been managing the patient's finances for the past 1 year, and has stated that patient should no longer drive since the patient last drove about 2 weeks ago, and has taken over most of the other IADLs such as cooking and cleaning as the patient's health has been declining due to her malignancy diagnosis. Uses a cane periodically.  Patient does manage her medications independently  LKW: 2 weeks ago, intermittent confusion Thrombolytic given?: No  Checklist of contraindications was reviewed and negative. Risks, benefits and alternatives were discussed  IA performed?: No Premorbid modified rankin scale:      2 - Slight disability. Able to look after own affairs without assistance, but unable to carry out all previous activities.  ROS: All other review of systems was negative except as noted in the HPI.  Unable to obtain due to altered mental status.   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 failure 08/18/2021   Diabetes  mellitus without complication (HCC)    Endometrial cancer, FIGO stage IVB (HCC) 08/18/2021   Goals of care, counseling/discussion 08/18/2021   History of kidney stones    Hypertension     Leg DVT (deep venous thromboembolism), chronic, right (HCC) 08/18/2021   Pneumonia     Family History  Problem Relation Age of Onset   Cancer Maternal Grandmother     Social History:  reports that she has never smoked. She has never used smokeless tobacco. She reports that she does not currently use alcohol. She reports that she does not use drugs.   Exam: Current vital signs: BP (!) 145/78 (BP Location: Right Arm)   Pulse 73   Temp (!) 97.3 F (36.3 C) (Axillary)   Resp 18   Ht 5\' 5"  (1.651 m)   Wt 60.8 kg   SpO2 100%   BMI 22.30 kg/m  Vital signs in last 24 hours: Temp:  [97.3 F (36.3 C)-97.4 F (36.3 C)] 97.3 F (36.3 C) (05/13 0610) Pulse Rate:  [73-98] 73 (05/13 0610) Resp:  [18] 18 (05/13 0610) BP: (145-173)/(78-104) 145/78 (05/13 0610) SpO2:  [90 %-100 %] 100 % (05/13 0610) Weight:  [60.8 kg] 60.8 kg (05/12 1907)  Physical Exam  Constitutional: Elderly appearing Psych: somnolent Eyes: No scleral injection HENT: normocephalic, atraumatic  Neck: Without stiffness, some resistance with flexion  MSK: no joint deformities.  Cardiovascular: Normal rate and regular rhythm.  Perfusing extremities well Respiratory: Effort normal, non-labored breathing GI: Soft.  No distension. There is no tenderness.  Skin: Warm dry and intact visible skin  Neuro: Mental Status: Patient is somnolent, not following commands. Opens eyes to name. Periodically throughout exam yells "ow" Cranial Nerves: II: Pupils are equal, round, and reactive to light. Unable to assess visual fields III,IV, VI: Not tracking to voice, EOM intact to VOR. Blinks to threat bilaterally V: Facial sensation is symmetric to temperature VII: Facial movement is symmetric on grimace VIII: hearing is intact to voice X: Unable to assess secondary to patient's mental status  XI: Unable to assess shoulder shrug XII: Will not stick out tongue Motor/sensory: Tone is notable for paratonia throughout.  Withdraws  to noxious stimulation in the bilateral upper extremities at least 2/5, lower extremities 1/5  Deep Tendon Reflexes: 2+ symmetric in brachioradialis, 2+ patella femoral reflex  Plantars: Toes are downgoing bilaterally. Cerebellar: Unable to assess with encephalopathy  Gait:  Deferred in acute setting  NIH: 19   I have reviewed labs in epic and the results pertinent to this consultation are:  Basic Metabolic Panel: Recent Labs  Lab 11/30/22 0315 12/01/22 0059 12/02/22 0416 12/03/22 0255 12/04/22 0233  NA 130* 129* 137 132* 139  K 3.9 4.0 3.4* 3.5 3.4*  CL 103 102 109 116* 110  CO2 15* 16* 14* 13* 15*  GLUCOSE 115* 256* 59* 98 203*  BUN 80* 78* 80* 71* 73*  CREATININE 4.81* 4.67* 4.32* 3.85* 3.65*  CALCIUM 7.6* 7.5* 8.0* 7.4* 8.2*  MG 1.5* 2.3 2.1 2.0 1.8    CBC: Recent Labs  Lab 11/28/22 0844 11/29/22 0403 11/30/22 0315 12/01/22 0059 12/02/22 0416 12/03/22 0255 12/04/22 0233  WBC 4.0   < > 3.9* 3.7* 5.9 4.7 4.1  NEUTROABS 3.3  --   --   --  4.6  --   --   HGB 7.3*   < > 9.0* 9.6* 9.8* 9.8* 9.9*  HCT 22.1*   < > 27.0* 28.6* 30.0* 30.6* 30.9*  MCV 81.3   < >  87.4 87.5 87.5 90.0 90.6  PLT 120*   < > 125* 151 195 181 187   < > = values in this interval not displayed.    Coagulation Studies: No results for input(s): "LABPROT", "INR" in the last 72 hours.   Lab Results  Component Value Date   VITAMINB12 1,055 (H) 12/03/2022    Lab Results  Component Value Date   TSH 2.520 12/03/2022    Ammonia 17   HIV negative  I have reviewed the images obtained:  Head CT 11/30/2022 1. No acute intracranial abnormality. 2. 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    MRI brain w/o contrast 1. 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 Given involvement of multiple vascular territories, consider an embolic etiology. 2. No  significant mass effect.  Impression:  Ms. Bernabe is a 57 y/o person living with a history of adenocarcinoma of the uterus followed by oncology, s/p left nephrostomy tube secondary to scarring from chemotherapeutics, chronic DVT of right lower extremity who presented with pseudomonas bacteremia and sepsis, now with worsening encephalopathy and found to have acute/subacute CVA's on imaging.   Per discussions with family possibly having TIA episodes prior to admission with symptoms of aphasia, now with evidence of acute/subacute infarcts of left frontoparietal region as well as punctate acute infarcts in the cerebellum/bilateral occipital lobes -- localization fits well with language difficulty. She has been on chronic anticoagulation (eliquis>fondeparinox) for her extensive right lower extremity DVT.  She is high risk for hypercoagulable state with her malignancy. Will check lipid panel, MRA head w/o contrast and US carotid arteries. Do not suspect embolic from DVT, IVC filter in place.   Based on the appearance of the infarcts on MRI and without hemorrhagic conversion thus far, my suspicion for septic emboli as the etiology is lower. No exam findings consistent with endocarditis and pseudomonas does not commonly associated with this. TTE has been ordered to assess for potential vegetations. Primary team in discussions with ID and oncology about port removal, has had infected port in the past per family at bedside. After ongoing discussions with primary and ID, plan will be to hold further anticoagulation until work up for septic emboli completed.   For her acute encephalopathy likely multifactorial; toxic from medications in setting of worsening renal dysfunction, acute CVA, sepsis, hospital delirium and likely developing dementia/cognitive impairment. She has no neck stiffness on examination or fevers since antibiotics were started and I do not believe a lumbar puncture is necessary at this time. With her  new infarcts, will check EEG to assess for any seizure like activity, per family staring spells and periodic twitching of muscles prior to admission but no evidence actively seizing on my exam. Was started on cefepime during hospitalization and with renal function cefepime neurotoxicity likely largely contributing.   Recommendations:  # Acute/subactue CVA - frontoparietal infarcts, punctate acute infarcts in the cerebellum and bilateral occipital lobes, etiology cardioembolic versus secondary to hypercoagulable state of malignancy versus atheroembolic from potential critical vessel stenosis - A1c of 8.3%, appreciate adjustment of medications per primary team/PCP to reach goal A1c less than 7% - LDL of 93, recommend starting rosuvastatin 5 mg once no longer NPO (given LDL only slightly above goal, will start at low-dose to improve tolerability) - MRA of the brain without contrast and carotid duplex results pending - Frequent neuro checks - Echocardiogram assess for PFO and vegetations -  Family to help clarify if patient was indeed adherent to her anticoagulation, given cognitive concerns described she may have missed doses - Holding anticoagulation for now pending confirmation of no endocarditis - Hold antiplatelet therapy for now due to concern for endocarditis - Risk factor modification - Telemetry monitoring - Blood pressure goal   - Normotension -PT/OT/speech consult when patient able to participate - Stroke team to follow  # Encephalopathy - Routine EEG - B1, RPR for reversible causes  - Appreciate substitution of cefepime with meropenem - At this time, risks of LP outweigh benefit  - Consider holding mirtazapine until patient is more awake - Consider decreasing gabapentin, weighing pain control with sedation given pain is also deliriogenic  Neurology will follow-up the workup as above, but will otherwise be available as needed, please do not hesitate to reach out if any questions or  concerns arise  Thalia Bloodgood DO  Internal Medicine Resident PGY-3 [place]  Pager: [phone]  Attending Neurologist's note:  I personally saw this patient, gathering history, performing a full neurologic examination, reviewing relevant labs, personally reviewing relevant imaging including MRI brain, head CT, and formulated the assessment and plan, adding the note above for completeness and clarity to accurately reflect my thoughts  Brooke Dare MD-PhD [facility] [phone] Available 7 AM to 7 PM, outside these hours please contact Neurologist on call listed on AMION

## 2022-12-04 NOTE — Progress Notes (Signed)
[facility] Infectious Diseases Follow Up Note  Patient Identification: Patient Name: Jaime Gomez MRN: [id number] Admit Date: 11/28/2022  8:13 AM Age: 57 y.o.Today's Date: 12/04/2022  Reason for Visit: bacteremia   Principal Problem:   Sepsis secondary to UTI [facility]) Active Problems:   Endometrial cancer, FIGO stage IVB (HCC)   Leg DVT (deep venous thromboembolism), chronic, right (HCC)   Acute renal failure superimposed on stage 3a chronic kidney disease (HCC)   Essential (primary) hypertension   Type 2 diabetes mellitus without complications (HCC)   Abnormality of lung on CXR   Metabolic acidosis with normal anion gap and failure of bicarbonate regeneration   Major depressive disorder, single episode, moderate (HCC)  Antibiotics:  Cefepime 5/7-c   Lines/Hardware: Rt chest port +   Interval Events:  Overall worsening mental status over the weekend Remains afebrile.  Labs remarkable for potassium 3.4, continuing at 3.65 WBC 4.1 Repeat blood cx 5/9 NG in 3 days  Chest Xray 5/12New hazy opacity in the left perihilar region - on room air, no respiratory symptoms, unlikely new PNA CT head 5/9 no acute abnormality   Assessment 57 year old female with h/o  DM, endometrial cancer on Femara and Afinitor ( follows Dr Myna Hidalgo) HTN, kidney stones chronic right leg DVT, anemia of CKD who presented to the ED on 5/7 with persistent fever, generalized bodyaches and weakness, flank pain and weakness  for few days. ID engaged for    # PsA bacteremia: Unclear source but high possibility of portacath being the source although no apparent signs of infection. Per sister, she had a bad infection with another bacteria several years ago    # Acute toxic/metabolic encephalopathy - in the setting of infection, uremia as well as analgesics. 5/9 CT head with no acute abnormality. Getting MRI brain today   # Neutropenia - resolved   Recommendations Will  switch IV cefepime to IV meropenem in light of confusion due to concerns for cefepime induced neurotoxicity  Follow MRI brain to r/o mets vs infarcts vs other causes Will check TTE although less likely endocarditis with gram negative organisms  Oncology following, Radiation oncology appears to have been consulted today Portacath  needs to come out Monitor CBC and BMP   Rest of the management as per the primary team. Thank you for the consult. Please page with pertinent questions or concerns.  ______________________________________________________________________ Subjective patient seen and examined at the bedside. Sitter at bedside who reports patient has been sleeping since 6 am and she was reported by night sitter that patient was agitated, screaming and crying earlier  Vitals BP (!) 145/78 (BP Location: Right Arm)   Pulse 73   Temp (!) 97.3 F (36.3 C) (Axillary)   Resp 18   Ht 5\' 5"  (1.651 m)   Wt 60.8 kg   SpO2 100%   BMI 22.30 kg/m     Physical Exam Constitutional:  adult female lying in the bed and sleeping     Comments: Pupils bilaterally equal and symmetrical   Cardiovascular:     Rate and Rhythm: Normal rate and regular rhythm.     Heart sounds:   Pulmonary:     Effort: Pulmonary effort is normal.     Comments:   Abdominal:     Palpations: Abdomen is soft.     Tenderness: non distended   Musculoskeletal:        General: No swelling or tenderness in peripheral joints. Mild pedal edema   Skin:    Comments: no signs  of infection in the rt chest portacath   Neurological:     General: unresponsive to verbal and physical stimuli   Pertinent Microbiology Results for orders placed or performed during the hospital encounter of 11/28/22  Resp panel by RT-PCR (RSV, Flu A&B, Covid) Anterior Nasal Swab     Status: None   Collection Time: 11/28/22  8:44 AM   Specimen: Anterior Nasal Swab  Result Value Ref Range Status   SARS Coronavirus 2 by RT PCR NEGATIVE  NEGATIVE Final    Comment: (NOTE) SARS-CoV-2 target nucleic acids are NOT DETECTED.  The SARS-CoV-2 RNA is generally detectable in upper respiratory specimens during the acute phase of infection. The lowest concentration of SARS-CoV-2 viral copies this assay can detect is 138 copies/mL. A negative result does not preclude SARS-Cov-2 infection and should not be used as the sole basis for treatment or other patient management decisions. A negative result may occur with  improper specimen collection/handling, submission of specimen other than nasopharyngeal swab, presence of viral mutation(s) within the areas targeted by this assay, and inadequate number of viral copies(<138 copies/mL). A negative result must be combined with clinical observations, patient history, and epidemiological information. The expected result is Negative.  Fact Sheet for Patients:  BloggerCourse.com  Fact Sheet for Healthcare Providers:  SeriousBroker.it  This test is no t yet approved or cleared by the [place] FDA and  has been authorized for detection and/or diagnosis of SARS-CoV-2 by FDA under an Emergency Use Authorization (EUA). This EUA will remain  in effect (meaning this test can be used) for the duration of the COVID-19 declaration under Section 564(b)(1) of the Act, 21 U.S.C.section 360bbb-3(b)(1), unless the authorization is terminated  or revoked sooner.       Influenza A by PCR NEGATIVE NEGATIVE Final   Influenza B by PCR NEGATIVE NEGATIVE Final    Comment: (NOTE) The Xpert Xpress SARS-CoV-2/FLU/RSV plus assay is intended as an aid in the diagnosis of influenza from Nasopharyngeal swab specimens and should not be used as a sole basis for treatment. Nasal washings and aspirates are unacceptable for Xpert Xpress SARS-CoV-2/FLU/RSV testing.  Fact Sheet for Patients: BloggerCourse.com  Fact Sheet for Healthcare  Providers: SeriousBroker.it  This test is not yet approved or cleared by the [place] FDA and has been authorized for detection and/or diagnosis of SARS-CoV-2 by FDA under an Emergency Use Authorization (EUA). This EUA will remain in effect (meaning this test can be used) for the duration of the COVID-19 declaration under Section 564(b)(1) of the Act, 21 U.S.C. section 360bbb-3(b)(1), unless the authorization is terminated or revoked.     Resp Syncytial Virus by PCR NEGATIVE NEGATIVE Final    Comment: (NOTE) Fact Sheet for Patients: BloggerCourse.com  Fact Sheet for Healthcare Providers: SeriousBroker.it  This test is not yet approved or cleared by the [place] FDA and has been authorized for detection and/or diagnosis of SARS-CoV-2 by FDA under an Emergency Use Authorization (EUA). This EUA will remain in effect (meaning this test can be used) for the duration of the COVID-19 declaration under Section 564(b)(1) of the Act, 21 U.S.C. section 360bbb-3(b)(1), unless the authorization is terminated or revoked.  Performed at [facility], [address] [place], [place] [address]   Blood Culture (routine x 2)     Status: Abnormal   Collection Time: 11/28/22  8:44 AM   Specimen: BLOOD  Result Value Ref Range Status   Specimen Description   Final    BLOOD  BLOOD RIGHT FOREARM Performed at [facility], [address] [place], [place] [address]    Special Requests   Final    Blood Culture results may not be optimal due to an excessive volume of blood received in culture bottles BOTTLES DRAWN AEROBIC AND ANAEROBIC Performed at [facility], [address] [place], [place] [address]    Culture  Setup Time   Final    GRAM NEGATIVE RODS ANAEROBIC BOTTLE ONLY CRITICAL RESULT CALLED TO, READ BACK BY AND VERIFIED WITH: L. Loletta Specter PHARMD, AT 1351 11/29/22 Renato Shin Performed at [facility], [address] [place], [place] [address]    Culture PSEUDOMONAS AERUGINOSA (A)  Final   Report Status 12/01/2022 FINAL  Final   Organism ID, Bacteria PSEUDOMONAS AERUGINOSA  Final      Susceptibility   Pseudomonas aeruginosa - MIC*    CEFTAZIDIME <=1 SENSITIVE Sensitive     CIPROFLOXACIN 0.5 SENSITIVE Sensitive     GENTAMICIN <=1 SENSITIVE Sensitive     IMIPENEM <=0.25 SENSITIVE Sensitive     PIP/TAZO <=4 SENSITIVE Sensitive     CEFEPIME 2 SENSITIVE Sensitive     * PSEUDOMONAS AERUGINOSA  Urine Culture     Status: Abnormal   Collection Time: 11/28/22  8:44 AM   Specimen: Urine, Random  Result Value Ref Range Status   Specimen Description   Final    URINE, RANDOM Performed at [facility], [address] [place], [place] [address]    Special Requests   Final    NONE Reflexed from [id number] Performed at [facility], [address] [place], [place] [address]    Culture (A)  Final    >=100,000 COLONIES/mL LACTOBACILLUS SPECIES Standardized susceptibility testing for this organism is not available. Performed at [facility], [address] [place], [place] [address]    Report Status 11/29/2022 FINAL  Final  Blood Culture ID Panel (Reflexed)     Status: Abnormal   Collection Time: 11/28/22  8:44 AM  Result Value Ref Range Status   Enterococcus faecalis NOT DETECTED NOT DETECTED Final   Enterococcus Faecium NOT DETECTED NOT DETECTED Final   Listeria monocytogenes NOT DETECTED NOT DETECTED Final   Staphylococcus species NOT DETECTED NOT DETECTED Final   Staphylococcus aureus (BCID) NOT DETECTED NOT DETECTED Final   Staphylococcus epidermidis NOT DETECTED NOT DETECTED Final   Staphylococcus lugdunensis NOT DETECTED NOT DETECTED Final   Streptococcus species NOT DETECTED NOT DETECTED Final   Streptococcus agalactiae NOT DETECTED NOT DETECTED Final   Streptococcus pneumoniae NOT DETECTED NOT DETECTED Final    Streptococcus pyogenes NOT DETECTED NOT DETECTED Final   A.calcoaceticus-baumannii NOT DETECTED NOT DETECTED Final   Bacteroides fragilis NOT DETECTED NOT DETECTED Final   Enterobacterales NOT DETECTED NOT DETECTED Final   Enterobacter cloacae complex NOT DETECTED NOT DETECTED Final   Escherichia coli NOT DETECTED NOT DETECTED Final   Klebsiella aerogenes NOT DETECTED NOT DETECTED Final   Klebsiella oxytoca NOT DETECTED NOT DETECTED Final   Klebsiella pneumoniae NOT DETECTED NOT DETECTED Final   Proteus species NOT DETECTED NOT DETECTED Final   Salmonella species NOT DETECTED NOT DETECTED Final   Serratia marcescens NOT DETECTED NOT DETECTED Final   Haemophilus influenzae NOT DETECTED NOT DETECTED Final   Neisseria meningitidis NOT DETECTED NOT DETECTED Final   Pseudomonas aeruginosa DETECTED (A) NOT DETECTED Final    Comment: CRITICAL RESULT CALLED TO, READ BACK BY AND  VERIFIED WITH: L. POINDEXTER PHARMD, AT 1351 11/29/22 D. VANHOOK    Stenotrophomonas maltophilia NOT DETECTED NOT DETECTED Final   Candida albicans NOT DETECTED NOT DETECTED Final   Candida auris NOT DETECTED NOT DETECTED Final   Candida glabrata NOT DETECTED NOT DETECTED Final   Candida krusei NOT DETECTED NOT DETECTED Final   Candida parapsilosis NOT DETECTED NOT DETECTED Final   Candida tropicalis NOT DETECTED NOT DETECTED Final   Cryptococcus neoformans/gattii NOT DETECTED NOT DETECTED Final   CTX-M ESBL NOT DETECTED NOT DETECTED Final   Carbapenem resistance IMP NOT DETECTED NOT DETECTED Final   Carbapenem resistance KPC NOT DETECTED NOT DETECTED Final   Carbapenem resistance NDM NOT DETECTED NOT DETECTED Final   Carbapenem resistance VIM NOT DETECTED NOT DETECTED Final    Comment: Performed at [facility], [address] [place], [place] [address]  Blood Culture (routine x 2)     Status: None   Collection Time: 11/28/22  9:02 AM   Specimen: BLOOD  Result Value Ref Range Status   Specimen Description    Final    BLOOD RIGHT ANTECUBITAL Performed at [facility], [address] [place], [place] [address]    Special Requests   Final    Blood Culture adequate volume BOTTLES DRAWN AEROBIC AND ANAEROBIC Performed at [facility], [address], [place], [place] [address]    Culture   Final    NO GROWTH 5 DAYS Performed at [facility], [address], [place], [place] [address]    Report Status 12/03/2022 FINAL  Final  Culture, blood (Routine X 2) w Reflex to ID Panel     Status: None (Preliminary result)   Collection Time: 11/30/22  6:25 PM   Specimen: BLOOD RIGHT ARM  Result Value Ref Range Status   Specimen Description   Final    BLOOD RIGHT ARM Performed at [facility], [address], [place], [place] [address]    Special Requests   Final    BOTTLES DRAWN AEROBIC ONLY Blood Culture adequate volume Performed at [facility], [address], [place], [place] [address]    Culture   Final    NO GROWTH 4 DAYS Performed at [facility], [address], [place], [place] [address]    Report Status PENDING  Incomplete  Culture, blood (Routine X 2) w Reflex to ID Panel     Status: None (Preliminary result)   Collection Time: 11/30/22  6:30 PM   Specimen: BLOOD LEFT HAND  Result Value Ref Range Status   Specimen Description   Final    BLOOD LEFT HAND Performed at [facility], [address], [place], [place] [address]    Special Requests   Final    BOTTLES DRAWN AEROBIC ONLY Blood Culture adequate volume Performed at [facility], [address] [place], [place] [address]    Culture   Final    NO GROWTH 4 DAYS Performed at [facility], [address], [place], [place] [address]    Report Status PENDING  Incomplete   Pertinent Lab.    Latest Ref Rng & Units 12/04/2022    2:33 AM 12/03/2022    2:55 AM 12/02/2022    4:16 AM  CBC  WBC 4.0 - 10.5 K/uL 4.1  4.7  5.9   Hemoglobin 12.0 -  15.0 g/dL 9.9  9.8  9.8   Hematocrit  36.0 - 46.0 % 30.9  30.6  30.0   Platelets 150 - 400 K/uL 187  181  195       Latest Ref Rng & Units 12/04/2022    2:33 AM 12/03/2022    2:55 AM 12/02/2022    4:16 AM  CMP  Glucose 70 - 99 mg/dL 161  98  59   BUN 6 - 20 mg/dL 73  71  80   Creatinine 0.44 - 1.00 mg/dL 0.96  0.45  4.09   Sodium 135 - 145 mmol/L 139  132  137   Potassium 3.5 - 5.1 mmol/L 3.4  3.5  3.4   Chloride 98 - 111 mmol/L 110  116  109   CO2 22 - 32 mmol/L 15  13  14    Calcium 8.9 - 10.3 mg/dL 8.2  7.4  8.0      Pertinent Imaging today Plain films and CT images have been personally visualized and interpreted; radiology reports have been reviewed. Decision making incorporated into the Impression   DG Chest Port 1 View  Result Date: 12/03/2022 CLINICAL DATA:  Dyspnea.  Fever. EXAM: PORTABLE CHEST 1 VIEW COMPARISON:  Nov 28, 2022 FINDINGS: Stable right Port-A-Cath in good position. No pneumothorax. The right lung is clear. The cardiomediastinal silhouette is stable. New hazy opacity in the left perihilar region. IMPRESSION: New hazy opacity in the left perihilar region could represent developing pneumonia. Recommend short-term follow-up imaging to ensure resolution. Electronically Signed   By: Gerome Sam III M.D.   On: 12/03/2022 10:26     I have personally spent 56 minutes involved in face-to-face and non-face-to-face activities for this patient on the day of the visit. Professional time spent includes the following activities: Preparing to see the patient (review of tests), Obtaining and/or reviewing separately obtained history (admission/discharge record), Performing a medically appropriate examination and/or evaluation , Ordering medications/tests/procedures, referring and communicating with other health care professionals, Documenting clinical information in the EMR, Independently interpreting results (not separately reported), Communicating results to the  patient/family/caregiver, Counseling and educating the patient/family/caregiver and Care coordination (not separately reported).   Plan d/w requesting provider as well as ID pharm D  Note: This document was prepared using dragon voice recognition software and may include unintentional dictation errors.   Electronically signed by:   Odette Fraction, MD Infectious Disease Physician [facility] Pager: [phone]

## 2022-12-04 NOTE — Progress Notes (Signed)
Initial Nutrition Assessment  DOCUMENTATION CODES:   Not applicable  INTERVENTION:  - SLP eval to determine safest diet.  - If cleared for a diet, recommend Ensure Plus High Protein po BID, each supplement provides 350 kcal and 20 grams of protein.   NUTRITION DIAGNOSIS:   Inadequate oral intake related to lethargy/confusion as evidenced by meal completion < 25%.  GOAL:   Patient will meet greater than or equal to 90% of their needs  MONITOR:   PO intake, Weight trends, Labs  REASON FOR ASSESSMENT:   Consult Assessment of nutrition requirement/status  ASSESSMENT:   57 y.o. female with medical history significant of endometrial cancer (currently on oral chemotherapy), CKD , chronic iron deficiency anemia, HTN, DM II who was admitted for sepsis secondary to urinary tract infection.   Patient confused and did not respond to any questions during visit. Occasionally screaming during visit. No family at bedside.   Per EMR, patient has had a 19# or 12.4% weight loss in 7 months, which is significant for the time frame.  Patient eating well at the beginning of admission but has had 0% of all meals over the past 2 days. Sitter and RN at bedside report patient unable to follow commands so not sure if safe for patient to eat at this time.  Plan to get SLP eval.   Medications reviewed and include: Folic acid, Insulin, Remeron  Labs reviewed:  K+ 3.4 Creatinine 3.65 HA1C 8.3 Blood Glucose 88-203 x24 hours Folate 28.4 (WNL)   NUTRITION - FOCUSED PHYSICAL EXAM:  Flowsheet Row Most Recent Value  Orbital Region Mild depletion  Upper Arm Region No depletion  Thoracic and Lumbar Region No depletion  Buccal Region No depletion  Temple Region No depletion  Clavicle Bone Region No depletion  Clavicle and Acromion Bone Region No depletion  Scapular Bone Region Unable to assess  Dorsal Hand No depletion  Patellar Region No depletion  Anterior Thigh Region No depletion   Posterior Calf Region No depletion  Edema (RD Assessment) None  Hair Reviewed  Eyes Reviewed  Mouth Reviewed  Skin Reviewed  Nails Reviewed       Diet Order:   Diet Order             Diet Carb Modified Fluid consistency: Thin; Room service appropriate? Yes  Diet effective now                   EDUCATION NEEDS:  Not appropriate for education at this time  Skin:  Skin Assessment: Reviewed RN Assessment  Last BM:  5/10  Height:  Ht Readings from Last 1 Encounters:  11/28/22 5\' 5"  (1.651 m)   Weight:  Wt Readings from Last 1 Encounters:  11/28/22 60.8 kg    BMI:  Body mass index is 22.3 kg/m.  Estimated Nutritional Needs:  Kcal:  1700-1950 kcals Protein:  60-75 grams Fluid:  >/= 1.7L   Shelle Iron RD, LDN For contact information, refer to [facility].

## 2022-12-04 NOTE — Procedures (Signed)
Vascular and Interventional Radiology Procedure Note  Patient: Jaime Gomez DOB: 1965/08/09 Medical Record Number: [id number] Note Date/Time: 12/04/22 4:49 PM   Performing Physician: Roanna Banning, MD Assistant(s): None  Diagnosis:  Sepsis. Bacteremia  Procedure: PORT REMOVAL  Anesthesia: Local Anesthetic Complications: None Estimated Blood Loss: Minimal Specimens:  None  Findings:  Successful removal of a right-sided venous port. Primary incision closure. Dermabond at skin.  See detailed procedure note with images in PACS. The patient tolerated the procedure well without incident or complication and was returned to Floor Bed in stable condition.    Roanna Banning, MD Vascular and Interventional Radiology Specialists [facility] Radiology   Pager. [phone] Clinic. [phone]

## 2022-12-04 NOTE — Progress Notes (Signed)
PROGRESS NOTE    Jaime Gomez  ZOX:[id number] DOB: [birth year] DOA: 11/28/2022 PCP: Staci Acosta, FNP   Brief Narrative:  57 y.o. 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 The patient has had placement of a left sided nephrostomy tube which was due for replacement on 11/29/2022.  Patient admitted for sepsis secondary to urinary tract infection.  IR consulted for nephrostomy tube change.  Patient tolerated the procedure well.  Due to anemia, 2 units PRBC was given with appropriate response.  Hospital course complicated by acute kidney injury which is slowly improving.   Assessment & Plan:  Principal Problem:   Sepsis secondary to UTI [facility]) Active Problems:   Endometrial cancer, FIGO stage IVB (HCC)   Leg DVT (deep venous thromboembolism), chronic, right (HCC)   Acute renal failure superimposed on stage 3a chronic kidney disease (HCC)   Essential (primary) hypertension   Type 2 diabetes mellitus without complications (HCC)   Abnormality of lung on CXR   Metabolic acidosis with normal anion gap and failure of bicarbonate regeneration   Major depressive disorder, single episode, moderate (HCC)     Assessment and Plan: * Sepsis secondary to UTI (HCC) Pseudomonas bacteremia Sepsis physiology stable for now.  Plans for nephrostomy tube change by IR 5/8.  Blood cultures growing Pseudomonas, on cefepime > need to change to meropenem due to concerns of neurotoxicity.   Medical team and ID Recommended to remove the port, but for now per Onc and family port to be remain in place. Discussed risks and benefits.   Acute renal failure superimposed on stage 3a chronic kidney disease (HCC) Metabolic acidosis Baseline creatinine 2.6, admission creatinine 4.9 > 5.15 > 3.85 > 3.65 Bicarb  stable, received a day of IV Bicarb. Concerns of fluid overload so will opt for pushes if needed. Poor PO intake.  Renal ultrasound-chronic disease Bladder scan - no retention.   Metabolic encephalopathy - Multifactorial-combination of not getting bowel rest, underlying infection, uremia, pain medication.  CT of the head does not show any evidence of acute pathology.  Continue close monitoring.  No focal neurodeficits.  TSH, ammonia, B12 are normal. Suspicion for cefepime toxicity, will change Abx with ID's assistance.   Acute/subacute CVA - Concerns of CVA versus septic emboli.  Septic emboli suspicion is low.  Neurology consulted.  Echocardiogram ordered.  Hypomagnesemia/hypokalemia - As needed repletion  Leg DVT (deep venous thromboembolism), chronic, right (HCC) The patient is on arixtra chronically.  Currently Arixtra is not a good option given renal function.  Reviewed risk of underdosing or overdosing. Hep drip, as renal fx continues to improve.  Currently on Heparin drip. Onc feels strongly about Arixtra, will defer dosing to their service when we decide to transition over. Arixtra is not standard of care at this level of renal fx, I have discussed this with the patient and her sister.   Depression - Mirtazapine bedtime.  Seen by psych.    Abnormality of lung on CXR The patient has persistent bilateral perihilar patchy opacities on CXR. They have previously been present on CXR of 08/2021 and PET-CT of 08/21/2022. Monitor. No current symptoms of respiratory illness.  Anemia of Chronic Dz Baseline Hb 9.5, slowly hemoglobin drifted down to 7.1.  Routinely gets outpatient transfusion.  Received 2 units PRBC 5/8.  Hemoglobin stable 9.9  Type 2  diabetes mellitus without complications (HCC) Peripheral neuropathy -Sliding scale and Accu-Chek. Gabapentin (renally dosed) Stop levemir until PO intake improves   Essential (primary) hypertension On Coreg, IV as needed  Chronic CVA -  New since February.  Discussed with patient and family regarding conservative management as this is now chronic.  Endometrial cancer, FIGO stage IVB (HCC) On outpatient chemotherapy  DVT prophylaxis: Heparin drip Code Status: Full Family Communication: Sister present at bedside Status is: Inpatient    Diet Orders (From admission, onward)     Start     Ordered   11/29/22 0956  Diet Carb Modified Fluid consistency: Thin; Room service appropriate? Yes  Diet effective now       Question Answer Comment  Diet-HS Snack? Nothing   Calorie Level Medium 1600-2000   Fluid consistency: Thin   Room service appropriate? Yes      11/29/22 0955            Subjective: Patient still remains confused.  Intermittently screaming and yelling in pain  Examination: Constitutional: Mild distress-but unable to express herself Respiratory: Clear to auscultation bilaterally Cardiovascular: Normal sinus rhythm, no rubs Abdomen: Nontender nondistended good bowel sounds Musculoskeletal: No edema noted Skin: No rashes seen Neurologic: Difficult to assess but nonfocal exam Psychiatric: Poor judgment and insight.  Chemo-Port in the right chest wall site looks okay Objective: Vitals:   12/03/22 1231 12/03/22 1942 12/04/22 0030 12/04/22 0610  BP: (!) 173/104 (!) 152/96 (!) 164/87 (!) 145/78  Pulse: 98 90 88 73  Resp:    18  Temp:  (!) 97.4 F (36.3 C)  (!) 97.3 F (36.3 C)  TempSrc:  Axillary  Axillary  SpO2: 100% 90%  100%  Weight:      Height:        Intake/Output Summary (Last 24 hours) at 12/04/2022 0747 Last data filed at 12/04/2022 0300 Gross per 24 hour  Intake 249.66 ml  Output 900 ml  Net -650.34 ml   Filed Weights   11/28/22 0819  Weight: 60.8 kg    Scheduled Meds:  acetaminophen  1,000 mg Oral Once   carvedilol  12.5 mg Oral BID WC   Chlorhexidine Gluconate Cloth  6 each Topical Daily   folic acid  1 mg Oral Q0600   gabapentin  100 mg Oral BID   insulin aspart  0-9  Units Subcutaneous TID WC   labetalol  5 mg Intravenous Q6H   mirtazapine  7.5 mg Oral QHS   sodium bicarbonate  650 mg Oral TID   Continuous Infusions:  ceFEPime (MAXIPIME) IV 2 g (12/03/22 0855)   heparin 750 Units/hr (12/03/22 0456)   sodium bicarbonate 150 mEq in dextrose 5 % 1,150 mL infusion 50 mL/hr at 12/03/22 1335    Nutritional status     Body mass index is 22.3 kg/m.  Data Reviewed:   CBC: Recent Labs  Lab 11/28/22 0844 11/29/22 0403 11/30/22 0315 12/01/22 0059 12/02/22 0416 12/03/22 0255 12/04/22 0233  WBC 4.0   < > 3.9* 3.7* 5.9 4.7 4.1  NEUTROABS 3.3  --   --   --  4.6  --   --   HGB 7.3*   < > 9.0* 9.6* 9.8* 9.8* 9.9*  HCT 22.1*   < > 27.0* 28.6* 30.0* 30.6* 30.9*  MCV 81.3   < > 87.4 87.5 87.5 90.0 90.6  PLT 120*   < > 125* 151 195 181 187   < > = values in this interval not displayed.  Basic Metabolic Panel: Recent Labs  Lab 11/30/22 0315 12/01/22 0059 12/02/22 0416 12/03/22 0255 12/04/22 0233  NA 130* 129* 137 132* 139  K 3.9 4.0 3.4* 3.5 3.4*  CL 103 102 109 116* 110  CO2 15* 16* 14* 13* 15*  GLUCOSE 115* 256* 59* 98 203*  BUN 80* 78* 80* 71* 73*  CREATININE 4.81* 4.67* 4.32* 3.85* 3.65*  CALCIUM 7.6* 7.5* 8.0* 7.4* 8.2*  MG 1.5* 2.3 2.1 2.0 1.8   GFR: Estimated Creatinine Clearance: 15.1 mL/min (A) (by C-G formula based on SCr of 3.65 mg/dL (H)). Liver Function Tests: Recent Labs  Lab 11/28/22 0844 11/29/22 0403  AST 23 76*  ALT 8 11  ALKPHOS 100 82  BILITOT 0.4 0.5  PROT 6.5 6.3*  ALBUMIN 2.4* 2.3*   No results for input(s): "LIPASE", "AMYLASE" in the last 168 hours. Recent Labs  Lab 12/03/22 1335  AMMONIA 17   Coagulation Profile: Recent Labs  Lab 11/28/22 0844 11/30/22 1204  INR 1.2 1.1   Cardiac Enzymes: No results for input(s): "CKTOTAL", "CKMB", "CKMBINDEX", "TROPONINI" in the last 168 hours. BNP (last 3 results) No results for input(s): "PROBNP" in the last 8760 hours. HbA1C: No results for input(s):  "HGBA1C" in the last 72 hours.  CBG: Recent Labs  Lab 12/03/22 1142 12/03/22 1622 12/03/22 1937 12/04/22 0204 12/04/22 0734  GLUCAP 122* 167* 190* 203* 155*   Lipid Profile: No results for input(s): "CHOL", "HDL", "LDLCALC", "TRIG", "CHOLHDL", "LDLDIRECT" in the last 72 hours. Thyroid Function Tests: Recent Labs    12/03/22 1335  TSH 2.520   Anemia Panel: Recent Labs    12/03/22 1335  VITAMINB12 1,055*  FOLATE 28.4   Sepsis Labs: Recent Labs  Lab 11/28/22 0844 12/03/22 1335  LATICACIDVEN 1.3 0.8    Recent Results (from the past 240 hour(s))  Resp panel by RT-PCR (RSV, Flu A&B, Covid) Anterior Nasal Swab     Status: None   Collection Time: 11/28/22  8:44 AM   Specimen: Anterior Nasal Swab  Result Value Ref Range Status   SARS Coronavirus 2 by RT PCR NEGATIVE NEGATIVE Final    Comment: (NOTE) SARS-CoV-2 target nucleic acids are NOT DETECTED.  The SARS-CoV-2 RNA is generally detectable in upper respiratory specimens during the acute phase of infection. The lowest concentration of SARS-CoV-2 viral copies this assay can detect is 138 copies/mL. A negative result does not preclude SARS-Cov-2 infection and should not be used as the sole basis for treatment or other patient management decisions. A negative result may occur with  improper specimen collection/handling, submission of specimen other than nasopharyngeal swab, presence of viral mutation(s) within the areas targeted by this assay, and inadequate number of viral copies(<138 copies/mL). A negative result must be combined with clinical observations, patient history, and epidemiological information. The expected result is Negative.  Fact Sheet for Patients:  BloggerCourse.com  Fact Sheet for Healthcare Providers:  SeriousBroker.it  This test is no t yet approved or cleared by the [place] FDA and  has been authorized for detection and/or diagnosis of  SARS-CoV-2 by FDA under an Emergency Use Authorization (EUA). This EUA will remain  in effect (meaning this test can be used) for the duration of the COVID-19 declaration under Section 564(b)(1) of the Act, 21 U.S.C.section 360bbb-3(b)(1), unless the authorization is terminated  or revoked sooner.       Influenza A by PCR NEGATIVE NEGATIVE Final   Influenza B by PCR NEGATIVE NEGATIVE Final    Comment: (NOTE) The Xpert  Xpress SARS-CoV-2/FLU/RSV plus assay is intended as an aid in the diagnosis of influenza from Nasopharyngeal swab specimens and should not be used as a sole basis for treatment. Nasal washings and aspirates are unacceptable for Xpert Xpress SARS-CoV-2/FLU/RSV testing.  Fact Sheet for Patients: BloggerCourse.com  Fact Sheet for Healthcare Providers: SeriousBroker.it  This test is not yet approved or cleared by the [place] FDA and has been authorized for detection and/or diagnosis of SARS-CoV-2 by FDA under an Emergency Use Authorization (EUA). This EUA will remain in effect (meaning this test can be used) for the duration of the COVID-19 declaration under Section 564(b)(1) of the Act, 21 U.S.C. section 360bbb-3(b)(1), unless the authorization is terminated or revoked.     Resp Syncytial Virus by PCR NEGATIVE NEGATIVE Final    Comment: (NOTE) Fact Sheet for Patients: BloggerCourse.com  Fact Sheet for Healthcare Providers: SeriousBroker.it  This test is not yet approved or cleared by the [place] FDA and has been authorized for detection and/or diagnosis of SARS-CoV-2 by FDA under an Emergency Use Authorization (EUA). This EUA will remain in effect (meaning this test can be used) for the duration of the COVID-19 declaration under Section 564(b)(1) of the Act, 21 U.S.C. section 360bbb-3(b)(1), unless the authorization is terminated  or revoked.  Performed at [facility], [address] [place], [place] [address]   Blood Culture (routine x 2)     Status: Abnormal   Collection Time: 11/28/22  8:44 AM   Specimen: BLOOD  Result Value Ref Range Status   Specimen Description   Final    BLOOD BLOOD RIGHT FOREARM Performed at [facility], [address] [place], [place] [address]    Special Requests   Final    Blood Culture results may not be optimal due to an excessive volume of blood received in culture bottles BOTTLES DRAWN AEROBIC AND ANAEROBIC Performed at [facility], [address] [place], [place] [address]    Culture  Setup Time   Final    GRAM NEGATIVE RODS ANAEROBIC BOTTLE ONLY CRITICAL RESULT CALLED TO, READ BACK BY AND VERIFIED WITH: L. Loletta Specter PHARMD, AT 1351 11/29/22 Renato Shin Performed at [facility], [address], [place], [place] [address]    Culture PSEUDOMONAS AERUGINOSA (A)  Final   Report Status 12/01/2022 FINAL  Final   Organism ID, Bacteria PSEUDOMONAS AERUGINOSA  Final      Susceptibility   Pseudomonas aeruginosa - MIC*    CEFTAZIDIME <=1 SENSITIVE Sensitive     CIPROFLOXACIN 0.5 SENSITIVE Sensitive     GENTAMICIN <=1 SENSITIVE Sensitive     IMIPENEM <=0.25 SENSITIVE Sensitive     PIP/TAZO <=4 SENSITIVE Sensitive     CEFEPIME 2 SENSITIVE Sensitive     * PSEUDOMONAS AERUGINOSA  Urine Culture     Status: Abnormal   Collection Time: 11/28/22  8:44 AM   Specimen: Urine, Random  Result Value Ref Range Status   Specimen Description   Final    URINE, RANDOM Performed at [facility], [address] [place], [place] [address]    Special Requests   Final    NONE Reflexed from [id number] Performed at [facility], [address] [place], [place] [address]    Culture (A)  Final    >=100,000 COLONIES/mL LACTOBACILLUS SPECIES Standardized susceptibility testing for this organism is not available. Performed at [facility], [address],  [place], [place] [address]    Report Status 11/29/2022 FINAL  Final  Blood Culture ID Panel (Reflexed)     Status: Abnormal   Collection Time: 11/28/22  8:44 AM  Result Value Ref Range Status   Enterococcus faecalis NOT DETECTED NOT DETECTED Final   Enterococcus Faecium NOT DETECTED NOT DETECTED Final   Listeria monocytogenes NOT DETECTED NOT DETECTED Final   Staphylococcus species NOT DETECTED NOT DETECTED Final   Staphylococcus aureus (BCID) NOT DETECTED NOT DETECTED Final   Staphylococcus epidermidis NOT DETECTED NOT DETECTED Final   Staphylococcus lugdunensis NOT DETECTED NOT DETECTED Final   Streptococcus species NOT DETECTED NOT DETECTED Final   Streptococcus agalactiae NOT DETECTED NOT DETECTED Final   Streptococcus pneumoniae NOT DETECTED NOT DETECTED Final   Streptococcus pyogenes NOT DETECTED NOT DETECTED Final   A.calcoaceticus-baumannii NOT DETECTED NOT DETECTED Final   Bacteroides fragilis NOT DETECTED NOT DETECTED Final   Enterobacterales NOT DETECTED NOT DETECTED Final   Enterobacter cloacae complex NOT DETECTED NOT DETECTED Final   Escherichia coli NOT DETECTED NOT DETECTED Final   Klebsiella aerogenes NOT DETECTED NOT DETECTED Final   Klebsiella oxytoca NOT DETECTED NOT DETECTED Final   Klebsiella pneumoniae NOT DETECTED NOT DETECTED Final   Proteus species NOT DETECTED NOT DETECTED Final   Salmonella species NOT DETECTED NOT DETECTED Final   Serratia marcescens NOT DETECTED NOT DETECTED Final   Haemophilus influenzae NOT DETECTED NOT DETECTED Final   Neisseria meningitidis NOT DETECTED NOT DETECTED Final   Pseudomonas aeruginosa DETECTED (A) NOT DETECTED Final    Comment: CRITICAL RESULT CALLED TO, READ BACK BY AND VERIFIED WITH: L. POINDEXTER PHARMD, AT 1351 11/29/22 D. VANHOOK    Stenotrophomonas maltophilia NOT DETECTED NOT DETECTED Final   Candida albicans NOT DETECTED NOT DETECTED Final   Candida auris NOT DETECTED NOT DETECTED Final    Candida glabrata NOT DETECTED NOT DETECTED Final   Candida krusei NOT DETECTED NOT DETECTED Final   Candida parapsilosis NOT DETECTED NOT DETECTED Final   Candida tropicalis NOT DETECTED NOT DETECTED Final   Cryptococcus neoformans/gattii NOT DETECTED NOT DETECTED Final   CTX-M ESBL NOT DETECTED NOT DETECTED Final   Carbapenem resistance IMP NOT DETECTED NOT DETECTED Final   Carbapenem resistance KPC NOT DETECTED NOT DETECTED Final   Carbapenem resistance NDM NOT DETECTED NOT DETECTED Final   Carbapenem resistance VIM NOT DETECTED NOT DETECTED Final    Comment: Performed at [facility], [address] [place], [place] [address]  Blood Culture (routine x 2)     Status: None   Collection Time: 11/28/22  9:02 AM   Specimen: BLOOD  Result Value Ref Range Status   Specimen Description   Final    BLOOD RIGHT ANTECUBITAL Performed at [facility], [address] [place], [place] [address]    Special Requests   Final    Blood Culture adequate volume BOTTLES DRAWN AEROBIC AND ANAEROBIC Performed at [facility], [address], [place], [place] [address]    Culture   Final    NO GROWTH 5 DAYS Performed at [facility], [address], [place], [place] [address]    Report Status 12/03/2022 FINAL  Final  Culture, blood (Routine X 2) w Reflex to ID Panel     Status: None (Preliminary result)   Collection Time: 11/30/22  6:25 PM   Specimen: BLOOD RIGHT ARM  Result Value Ref Range Status   Specimen Description   Final  BLOOD RIGHT ARM Performed at [facility], [address], [place], [place] [address]    Special Requests   Final    BOTTLES DRAWN AEROBIC ONLY Blood Culture adequate volume Performed at [facility], [address] [place], [place] [address]    Culture   Final    NO GROWTH 4 DAYS Performed at [facility], [address], [place], [place] [address]    Report Status PENDING  Incomplete  Culture,  blood (Routine X 2) w Reflex to ID Panel     Status: None (Preliminary result)   Collection Time: 11/30/22  6:30 PM   Specimen: BLOOD LEFT HAND  Result Value Ref Range Status   Specimen Description   Final    BLOOD LEFT HAND Performed at [facility], [address], [place], [place] [address]    Special Requests   Final    BOTTLES DRAWN AEROBIC ONLY Blood Culture adequate volume Performed at [facility], [address], [place], [place] [address]    Culture   Final    NO GROWTH 4 DAYS Performed at [facility], [address], [place], [place] [address]    Report Status PENDING  Incomplete         Radiology Studies: DG Chest Port 1 View  Result Date: 12/03/2022 CLINICAL DATA:  Dyspnea.  Fever. EXAM: PORTABLE CHEST 1 VIEW COMPARISON:  Nov 28, 2022 FINDINGS: Stable right Port-A-Cath in good position. No pneumothorax. The right lung is clear. The cardiomediastinal silhouette is stable. New hazy opacity in the left perihilar region. IMPRESSION: New hazy opacity in the left perihilar region could represent developing pneumonia. Recommend short-term follow-up imaging to ensure resolution. Electronically Signed   By: Gerome Sam III M.D.   On: 12/03/2022 10:26           LOS: 6 days   Time spent= 35 mins    Nina Hoar Joline Maxcy, MD [facility]  If 7PM-7AM, please contact night-coverage  12/04/2022, 7:47 AM

## 2022-12-04 NOTE — Plan of Care (Signed)
  Problem: Education: Goal: Knowledge of General Education information will improve Description: Including pain rating scale, medication(s)/side effects and non-pharmacologic comfort measures Outcome: Not Progressing   Problem: Health Behavior/Discharge Planning: Goal: Ability to manage health-related needs will improve Outcome: Not Progressing   

## 2022-12-04 NOTE — Progress Notes (Signed)
Carotid duplex bilateral study completed.   Please see CV Proc for preliminary results.   Jean Rosenthal, RDMS, RVT on behalf of [place], RDMS, RVT

## 2022-12-04 NOTE — Progress Notes (Signed)
[facility] Infectious Diseases Follow Up Note  Patient Identification: Patient Name: Jaime Gomez MRN: [id number] Admit Date: 11/28/2022  8:13 AM Age: 57 y.o.Today's Date: 12/04/2022  Reason for Visit: bacteremia   Principal Problem:   Sepsis secondary to UTI [facility]) Active Problems:   Endometrial cancer, FIGO stage IVB (HCC)   Leg DVT (deep venous thromboembolism), chronic, right (HCC)   Acute renal failure superimposed on stage 3a chronic kidney disease (HCC)   Essential (primary) hypertension   Type 2 diabetes mellitus without complications (HCC)   Abnormality of lung on CXR   Metabolic acidosis with normal anion gap and failure of bicarbonate regeneration   Major depressive disorder, single episode, moderate (HCC)  Antibiotics:  Cefepime 5/7-c   Lines/Hardware: Rt chest port +   Interval Events: afebrile for more than 24 hrs Labs remarkable for Na 129, Cr up to 4.67, WBC 3.7 CT head with no acute abnormality done for concerns for TIA US kidneys s/o medical renal disease  Assessment 57 year old female with h/o  DM, endometrial cancer on Femara and Afinitor ( follows Dr Myna Hidalgo) HTN, kidney stones chronic right leg DVT, anemia of CKD who presented to the ED on 5/7 with persistent fever, generalized bodyaches and weakness, flank pain and weakness  for few days. ID engaged for    # PsA bacteremia: unclear source, Portacath removed 5/13 for possible source. Concern for possible endocarditis given multiple embolic infarcts in MRI brain but uncommon organism as such. TTE 5/13 did not show vegetations. Repeat blood cx 5/9 NG in 5 days   # Acute encephalopathy - 2/2 infection, new CVA, uremia, EEG 5/13 with no seizure activity seen. Mri brain: Acute or early subacute left frontoparietal infarcts with additional punctate acute infarcts in the cerebellum and bilateral occipital lobes. Given involvement of multiple vascular territories,  consider an embolic etiology. Neurology following- less likely septic emboli. LP is considered more risk than benefit per Neurology  # New onset acute CHF - Cardiology consulted and thought to be Takotsubo cardiomyopathy   Recommendations Continue meropenem for now, will have CNS coverage  Would consider TEE to exclude vegetations/endocarditis if possible, however she may not be a candidate for TEE now with her encephalopathy. Defer to Cardiology  Monitor CBC and CMP Follow recommendations  from other consult services  Following   Rest of the management as per the primary team. Thank you for the consult. Please page with pertinent questions or concerns.  ______________________________________________________________________ Subjective patient seen and examined at the bedside. Sister at bedside. Nursing repositioning her and reports she moans, does not responds but seems she hears things which are told to her.   Vitals BP (!) 145/78 (BP Location: Right Arm)   Pulse 73   Temp (!) 97.3 F (36.3 C) (Axillary)   Resp 18   Ht 5\' 5"  (1.651 m)   Wt 60.8 kg   SpO2 100%   BMI 22.30 kg/m     Physical Exam Constitutional:  adult female lying in the bed with closed eyes     Comments: Pupils bilaterally symmetrical   Cardiovascular:     Rate and Rhythm: Normal rate and regular rhythm.     Heart sounds:   Pulmonary:     Effort: Pulmonary effort is normal.     Comments: Normal breath sounds   Abdominal:     Palpations: Abdomen is soft.     Tenderness: non distended   Musculoskeletal:        General: No swelling or tenderness in  peripheral joints.   Skin:    Comments: rt chest portacath removed site is bandaged    Neurological:     General: moans but did not respond otherwise.   Pertinent Microbiology Results for orders placed or performed during the hospital encounter of 11/28/22  Resp panel by RT-PCR (RSV, Flu A&B, Covid) Anterior Nasal Swab     Status: None   Collection  Time: 11/28/22  8:44 AM   Specimen: Anterior Nasal Swab  Result Value Ref Range Status   SARS Coronavirus 2 by RT PCR NEGATIVE NEGATIVE Final    Comment: (NOTE) SARS-CoV-2 target nucleic acids are NOT DETECTED.  The SARS-CoV-2 RNA is generally detectable in upper respiratory specimens during the acute phase of infection. The lowest concentration of SARS-CoV-2 viral copies this assay can detect is 138 copies/mL. A negative result does not preclude SARS-Cov-2 infection and should not be used as the sole basis for treatment or other patient management decisions. A negative result may occur with  improper specimen collection/handling, submission of specimen other than nasopharyngeal swab, presence of viral mutation(s) within the areas targeted by this assay, and inadequate number of viral copies(<138 copies/mL). A negative result must be combined with clinical observations, patient history, and epidemiological information. The expected result is Negative.  Fact Sheet for Patients:  BloggerCourse.com  Fact Sheet for Healthcare Providers:  SeriousBroker.it  This test is no t yet approved or cleared by the [place] FDA and  has been authorized for detection and/or diagnosis of SARS-CoV-2 by FDA under an Emergency Use Authorization (EUA). This EUA will remain  in effect (meaning this test can be used) for the duration of the COVID-19 declaration under Section 564(b)(1) of the Act, 21 U.S.C.section 360bbb-3(b)(1), unless the authorization is terminated  or revoked sooner.       Influenza A by PCR NEGATIVE NEGATIVE Final   Influenza B by PCR NEGATIVE NEGATIVE Final    Comment: (NOTE) The Xpert Xpress SARS-CoV-2/FLU/RSV plus assay is intended as an aid in the diagnosis of influenza from Nasopharyngeal swab specimens and should not be used as a sole basis for treatment. Nasal washings and aspirates are unacceptable for Xpert Xpress  SARS-CoV-2/FLU/RSV testing.  Fact Sheet for Patients: BloggerCourse.com  Fact Sheet for Healthcare Providers: SeriousBroker.it  This test is not yet approved or cleared by the [place] FDA and has been authorized for detection and/or diagnosis of SARS-CoV-2 by FDA under an Emergency Use Authorization (EUA). This EUA will remain in effect (meaning this test can be used) for the duration of the COVID-19 declaration under Section 564(b)(1) of the Act, 21 U.S.C. section 360bbb-3(b)(1), unless the authorization is terminated or revoked.     Resp Syncytial Virus by PCR NEGATIVE NEGATIVE Final    Comment: (NOTE) Fact Sheet for Patients: BloggerCourse.com  Fact Sheet for Healthcare Providers: SeriousBroker.it  This test is not yet approved or cleared by the [place] FDA and has been authorized for detection and/or diagnosis of SARS-CoV-2 by FDA under an Emergency Use Authorization (EUA). This EUA will remain in effect (meaning this test can be used) for the duration of the COVID-19 declaration under Section 564(b)(1) of the Act, 21 U.S.C. section 360bbb-3(b)(1), unless the authorization is terminated or revoked.  Performed at [facility], [address] [place], [place] [address]   Blood Culture (routine x 2)     Status: Abnormal   Collection Time: 11/28/22  8:44 AM   Specimen: BLOOD  Result Value Ref Range Status  Specimen Description   Final    BLOOD BLOOD RIGHT FOREARM Performed at [facility], [address] [place], [place] [address]    Special Requests   Final    Blood Culture results may not be optimal due to an excessive volume of blood received in culture bottles BOTTLES DRAWN AEROBIC AND ANAEROBIC Performed at [facility], [address] [place], [place] [address]    Culture  Setup Time   Final    GRAM NEGATIVE  RODS ANAEROBIC BOTTLE ONLY CRITICAL RESULT CALLED TO, READ BACK BY AND VERIFIED WITH: L. Loletta Specter PHARMD, AT 1351 11/29/22 Renato Shin Performed at [facility], [address], [place], [place] [address]    Culture PSEUDOMONAS AERUGINOSA (A)  Final   Report Status 12/01/2022 FINAL  Final   Organism ID, Bacteria PSEUDOMONAS AERUGINOSA  Final      Susceptibility   Pseudomonas aeruginosa - MIC*    CEFTAZIDIME <=1 SENSITIVE Sensitive     CIPROFLOXACIN 0.5 SENSITIVE Sensitive     GENTAMICIN <=1 SENSITIVE Sensitive     IMIPENEM <=0.25 SENSITIVE Sensitive     PIP/TAZO <=4 SENSITIVE Sensitive     CEFEPIME 2 SENSITIVE Sensitive     * PSEUDOMONAS AERUGINOSA  Urine Culture     Status: Abnormal   Collection Time: 11/28/22  8:44 AM   Specimen: Urine, Random  Result Value Ref Range Status   Specimen Description   Final    URINE, RANDOM Performed at [facility], [address] [place], [place] [address]    Special Requests   Final    NONE Reflexed from [id number] Performed at [facility], [address] [place], [place] [address]    Culture (A)  Final    >=100,000 COLONIES/mL LACTOBACILLUS SPECIES Standardized susceptibility testing for this organism is not available. Performed at [facility], [address] [place], [place] [address]    Report Status 11/29/2022 FINAL  Final  Blood Culture ID Panel (Reflexed)     Status: Abnormal   Collection Time: 11/28/22  8:44 AM  Result Value Ref Range Status   Enterococcus faecalis NOT DETECTED NOT DETECTED Final   Enterococcus Faecium NOT DETECTED NOT DETECTED Final   Listeria monocytogenes NOT DETECTED NOT DETECTED Final   Staphylococcus species NOT DETECTED NOT DETECTED Final   Staphylococcus aureus (BCID) NOT DETECTED NOT DETECTED Final   Staphylococcus epidermidis NOT DETECTED NOT DETECTED Final   Staphylococcus lugdunensis NOT DETECTED NOT DETECTED Final   Streptococcus species NOT DETECTED NOT DETECTED  Final   Streptococcus agalactiae NOT DETECTED NOT DETECTED Final   Streptococcus pneumoniae NOT DETECTED NOT DETECTED Final   Streptococcus pyogenes NOT DETECTED NOT DETECTED Final   A.calcoaceticus-baumannii NOT DETECTED NOT DETECTED Final   Bacteroides fragilis NOT DETECTED NOT DETECTED Final   Enterobacterales NOT DETECTED NOT DETECTED Final   Enterobacter cloacae complex NOT DETECTED NOT DETECTED Final   Escherichia coli NOT DETECTED NOT DETECTED Final   Klebsiella aerogenes NOT DETECTED NOT DETECTED Final   Klebsiella oxytoca NOT DETECTED NOT DETECTED Final   Klebsiella pneumoniae NOT DETECTED NOT DETECTED Final   Proteus species NOT DETECTED NOT DETECTED Final   Salmonella species NOT DETECTED NOT DETECTED Final   Serratia marcescens NOT DETECTED NOT DETECTED Final   Haemophilus influenzae NOT DETECTED NOT DETECTED Final   Neisseria meningitidis NOT DETECTED NOT DETECTED Final   Pseudomonas aeruginosa DETECTED (A) NOT DETECTED Final  Comment: CRITICAL RESULT CALLED TO, READ BACK BY AND VERIFIED WITH: L. POINDEXTER PHARMD, AT 1351 11/29/22 D. VANHOOK    Stenotrophomonas maltophilia NOT DETECTED NOT DETECTED Final   Candida albicans NOT DETECTED NOT DETECTED Final   Candida auris NOT DETECTED NOT DETECTED Final   Candida glabrata NOT DETECTED NOT DETECTED Final   Candida krusei NOT DETECTED NOT DETECTED Final   Candida parapsilosis NOT DETECTED NOT DETECTED Final   Candida tropicalis NOT DETECTED NOT DETECTED Final   Cryptococcus neoformans/gattii NOT DETECTED NOT DETECTED Final   CTX-M ESBL NOT DETECTED NOT DETECTED Final   Carbapenem resistance IMP NOT DETECTED NOT DETECTED Final   Carbapenem resistance KPC NOT DETECTED NOT DETECTED Final   Carbapenem resistance NDM NOT DETECTED NOT DETECTED Final   Carbapenem resistance VIM NOT DETECTED NOT DETECTED Final    Comment: Performed at [facility], [address], [place], [place] [address]  Blood Culture (routine x 2)      Status: None   Collection Time: 11/28/22  9:02 AM   Specimen: BLOOD  Result Value Ref Range Status   Specimen Description   Final    BLOOD RIGHT ANTECUBITAL Performed at [facility], [address] [place], [place] [address]    Special Requests   Final    Blood Culture adequate volume BOTTLES DRAWN AEROBIC AND ANAEROBIC Performed at [facility], [address] [place], [place] [address]    Culture   Final    NO GROWTH 5 DAYS Performed at [facility], [address] [place], [place] [address]    Report Status 12/03/2022 FINAL  Final  Culture, blood (Routine X 2) w Reflex to ID Panel     Status: None (Preliminary result)   Collection Time: 11/30/22  6:25 PM   Specimen: BLOOD RIGHT ARM  Result Value Ref Range Status   Specimen Description   Final    BLOOD RIGHT ARM Performed at [facility], [address], [place], [place] [address]    Special Requests   Final    BOTTLES DRAWN AEROBIC ONLY Blood Culture adequate volume Performed at [facility], [address], [place], [place] [address]    Culture   Final    NO GROWTH 4 DAYS Performed at [facility], [address], [place], [place] [address]    Report Status PENDING  Incomplete  Culture, blood (Routine X 2) w Reflex to ID Panel     Status: None (Preliminary result)   Collection Time: 11/30/22  6:30 PM   Specimen: BLOOD LEFT HAND  Result Value Ref Range Status   Specimen Description   Final    BLOOD LEFT HAND Performed at [facility], [address], [place], [place] [address]    Special Requests   Final    BOTTLES DRAWN AEROBIC ONLY Blood Culture adequate volume Performed at [facility], [address], [place], [place] [address]    Culture   Final    NO GROWTH 4 DAYS Performed at [facility], [address] [place], [place] [address]    Report Status PENDING  Incomplete   Pertinent Lab.    Latest Ref Rng & Units  12/04/2022    2:33 AM 12/03/2022    2:55 AM 12/02/2022    4:16 AM  CBC  WBC 4.0 - 10.5 K/uL 4.1  4.7  5.9   Hemoglobin 12.0 - 15.0  g/dL 9.9  9.8  9.8   Hematocrit 36.0 - 46.0 % 30.9  30.6  30.0   Platelets 150 - 400 K/uL 187  181  195       Latest Ref Rng & Units 12/04/2022    2:33 AM 12/03/2022    2:55 AM 12/02/2022    4:16 AM  CMP  Glucose 70 - 99 mg/dL 161  98  59   BUN 6 - 20 mg/dL 73  71  80   Creatinine 0.44 - 1.00 mg/dL 0.96  0.45  4.09   Sodium 135 - 145 mmol/L 139  132  137   Potassium 3.5 - 5.1 mmol/L 3.4  3.5  3.4   Chloride 98 - 111 mmol/L 110  116  109   CO2 22 - 32 mmol/L 15  13  14    Calcium 8.9 - 10.3 mg/dL 8.2  7.4  8.0      Pertinent Imaging today Plain films and CT images have been personally visualized and interpreted; radiology reports have been reviewed. Decision making incorporated into the Impression   DG Chest Port 1 View  Result Date: 12/03/2022 CLINICAL DATA:  Dyspnea.  Fever. EXAM: PORTABLE CHEST 1 VIEW COMPARISON:  Nov 28, 2022 FINDINGS: Stable right Port-A-Cath in good position. No pneumothorax. The right lung is clear. The cardiomediastinal silhouette is stable. New hazy opacity in the left perihilar region. IMPRESSION: New hazy opacity in the left perihilar region could represent developing pneumonia. Recommend short-term follow-up imaging to ensure resolution. Electronically Signed   By: Gerome Sam III M.D.   On: 12/03/2022 10:26     I have personally spent 56 minutes involved in face-to-face and non-face-to-face activities for this patient on the day of the visit. Professional time spent includes the following activities: Preparing to see the patient (review of tests), Obtaining and/or reviewing separately obtained history (admission/discharge record), Performing a medically appropriate examination and/or evaluation , Ordering medications/tests/procedures, referring and communicating with other health care professionals, Documenting clinical  information in the EMR, Independently interpreting results (not separately reported), Communicating results to the patient/family/caregiver, Counseling and educating the patient/family/caregiver and Care coordination (not separately reported).   Plan d/w requesting provider as well as ID pharm D  Note: This document was prepared using dragon voice recognition software and may include unintentional dictation errors.   Electronically signed by:   Odette Fraction, MD Infectious Disease Physician [facility] for Infectious Disease Pager: [phone]

## 2022-12-04 NOTE — Progress Notes (Signed)
Pharmacy Antibiotic Note  Jaime Gomez is a 57 y.o. female admitted on 11/28/2022 with sepsis secondary to Pseudomonas aeruginosa bacteremia. Pharmacy has been consulted for Meropenem dosing.  Plan: Meropenem 1g IV q12h Monitor renal function, cultures, clinical course, further ID recommendations   Height: 5\' 5"  (165.1 cm) Weight: 60.8 kg (134 lb) IBW/kg (Calculated) : 57  Temp (24hrs), Avg:97.7 F (36.5 C), Min:97.3 F (36.3 C), Max:98.3 F (36.8 C)  Recent Labs  Lab 11/28/22 0844 11/29/22 0403 11/30/22 0315 12/01/22 0059 12/02/22 0416 12/03/22 0255 12/03/22 1335 12/04/22 0233  WBC 4.0   < > 3.9* 3.7* 5.9 4.7  --  4.1  CREATININE 4.94*   < > 4.81* 4.67* 4.32* 3.85*  --  3.65*  LATICACIDVEN 1.3  --   --   --   --   --  0.8  --    < > = values in this interval not displayed.    Estimated Creatinine Clearance: 15.1 mL/min (A) (by C-G formula based on SCr of 3.65 mg/dL (H)).    Allergies  Allergen Reactions   Taxotere [Docetaxel] Swelling    Throat swelling    Antimicrobials this admission: 5/7 Cefepime >> 5/13 5/13 Meropenem >>   Microbiology results: 5/7 BCx: 1/4 bottles Pseudomonas aeruginosa (pan-sensitive) 5/7 UCx: > 100K Lactobacillus species 5/9 BCx: ngtd   Thank you for allowing pharmacy to be a part of this patient's care.   Greer Pickerel, PharmD, BCPS Clinical Pharmacist 12/04/2022 8:16 AM

## 2022-12-04 NOTE — TOC Progression Note (Signed)
Transition of Care [facility]) - Progression Note    Patient Details  Name: Jaime Gomez MRN: [id number] Date of Birth: 1966-04-07  Transition of Care [facility]) CM/SW Contact  Howell Rucks, RN Phone Number: 12/04/2022, 1:11 PM  Clinical Narrative:  Met with pt's sister [name]) at  bedside (pt sedated at time of visit), introduced role of TOC/NCM and review for dc needs, PT recommendation for short term rehab-SNF, [name] agreeable to short term rehab, no preference, reports she will do some research, NCM will send out for bed offers, [name] agreeable. Will continue to follow.      Expected Discharge Plan: Home/Self Care Barriers to Discharge: Continued Medical Work up  Expected Discharge Plan and Services   Discharge Planning Services: CM Consult                                           Social Determinants of Health (SDOH) Interventions SDOH Screenings   Food Insecurity: No Food Insecurity (11/28/2022)  Housing: Low Risk  (11/28/2022)  Transportation Needs: No Transportation Needs (11/28/2022)  Utilities: Not At Risk (11/28/2022)  Depression (PHQ2-9): Low Risk  (09/18/2022)  Financial Resource Strain: Low Risk  (06/27/2022)  Stress: No Stress Concern Present (06/27/2022)  Tobacco Use: Low Risk  (11/29/2022)    Readmission Risk Interventions    11/30/2022   10:29 AM  Readmission Risk Prevention Plan  Transportation Screening Complete  PCP or Specialist Appt within 3-5 Days Complete  HRI or Home Care Consult Complete  Social Work Consult for Recovery Care Planning/Counseling Complete  Palliative Care Screening Not Applicable  Medication Review [employer]) Complete

## 2022-12-04 NOTE — TOC CM/SW Note (Signed)
30 Day PASRR Note   Patient Details  Name: Jaime Gomez Date of Birth: [birth year]   Transition of Care [facility]) CM/SW Contact:    Howell Rucks, RN Phone Number: 12/04/2022, 1:25 PM  To Whom It May Concern:  Please be advised that this patient will require a short-term nursing home stay - anticipated 30 days or less for rehabilitation and strengthening.   The plan is for return home.

## 2022-12-04 NOTE — Progress Notes (Signed)
EEG complete - results pending 

## 2022-12-04 NOTE — Progress Notes (Signed)
Unfortunately, Jaime Gomez clearly has had a change in her overall neurological status over the weekend.  She has become somewhat somnolent right now.  Over the weekend she had some confusion.  There is agitation.  She went for an MRI this morning.  The results not back yet.  When I saw her this morning, she really did not respond to me.  She kept her eyes closed.  I am unsure exactly why she has had this change.  She had negative blood cultures on the ninth.  She had a chest x-ray I think over the weekend which may have shown a little pneumonia.  She summarized does not behave like having pneumonia.  There is no tachypnea.  There is no dyspnea.  Her blood sugars certainly have not been on the low side.  This morning, her blood sugar is 203.  Her BUN is 73 creatinine 3.65.  Her white cell count is 4.1.  Hemoglobin 9.9.  Platelet count 187,000.  Again, I am not sure as to why she has this neurological change.  She has never had any obvious seeing metastasis.    She is not eating.  There is no nausea or vomiting.  Again she is on heparin infusion.  I would not think that she has had any bleeding into the brain.  Her vital signs are temperature of 97.3.  Pulse 73.  Blood pressure 145/78.  Oxygen saturation on room air is 100%.  Her lungs sound clear bilaterally.  I do not hear any wheezing.  Cardiac exam regular rate and rhythm.  Abdomen is soft.  Bowel sounds are present-may be slightly decreased.  There is no guarding or rebound tenderness.  She has no obvious abdominal mass.  There is no fluid wave.  There is no palpable liver or spleen tip.  Extremities shows some chronic mild edema in the lower legs.  Neurological exam shows no focal neurological deficits.  Again, she seems to be somewhat somnolent.  Again I am unsure as to why this change.  When I saw her on Friday, she was doing quite well.  She did have the Pseudomonas in her blood.  However recent cultures were negative.  The Pseudomonas is  pansensitive.  We will have to see what the MRI has to show.  Hopefully, she will not need a lumbar puncture.  I spoke to her sister this morning who was with her.  I so told her my opinion.  We probably need to try to get back on medications for right now and see how she does.  I do appreciate the outstanding care that she is getting from everybody on [address].   Jaime Bach, MD  Fayrene Fearing 1:5

## 2022-12-04 NOTE — Progress Notes (Signed)
SLP Cancellation Note  Patient Details Name: Jaime Gomez MRN: [id number] DOB: April 22, 1966   Cancelled treatment:       Reason Eval/Treat Not Completed: Other (comment) New order for bedside swallow evaluation with SLP received and acknowledged. Per RN, patient has had a drastic decline since 5/10 and is unable to follow commands and advised that patient would not be able to participate.  SLP will f/u next date.   Angela Nevin, MA, CCC-SLP Speech Therapy

## 2022-12-04 NOTE — Progress Notes (Signed)
ANTICOAGULATION CONSULT NOTE   Pharmacy Consult for IV heparin Indication: history of DVT  Allergies  Allergen Reactions   Taxotere [Docetaxel] Swelling    Throat swelling    Patient Measurements: Height: 5\' 5"  (165.1 cm) Weight: 60.8 kg (134 lb) IBW/kg (Calculated) : 57 Heparin Dosing Weight: TBW  Vital Signs: Temp: 97.3 F (36.3 C) (05/13 0610) Temp Source: Axillary (05/13 0610) BP: 145/78 (05/13 0610) Pulse Rate: 73 (05/13 0610)  Labs: Recent Labs    12/02/22 0416 12/03/22 0255 12/04/22 0233  HGB 9.8* 9.8* 9.9*  HCT 30.0* 30.6* 30.9*  PLT 195 181 187  HEPARINUNFRC 0.51 0.46 0.31  CREATININE 4.32* 3.85* 3.65*     Estimated Creatinine Clearance: 15.1 mL/min (A) (by C-G formula based on SCr of 3.65 mg/dL (H)).   Medications:  PTA Fondaparinux 7.5mg  SQ daily (contraindicated per drug labeling with CrCl chronically < 30 ml/min). Last dose per patient 5/7.  Assessment: Patient is an 57 y.o female admitted on 5/7 for sepsis secondary to urinary tract infection. Now with Pseudomonas bacteremia and AKI on CKD. On 5/8 nephrostomy exchanged by IR. 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 Pharmacy consulted for IV heparin dosing.   Today, 12/04/22 Heparin level = 0.31 units/mL, remains therapeutic but trending down on heparin infusion at 750 units/hr Hgb 9.9, low but stable; Plt improved to WNL SCr = 3.65, CrCl ~ 15 ml/min Per RN, heparin paused from [phone] for MRI this AM No bleeding issues noted per nursing   Goal of Therapy:  Heparin level 0.3-0.7 units/ml Monitor platelets by anticoagulation protocol: Yes   Plan:  Continue heparin infusion at 750 units/hr  Will recheck heparin level this afternoon to confirm remains within therapeutic range since heparin level has been steadily trending down over past several days  Daily heparin level and CBC Monitor for signs/symptoms of bleeding F/u plans for long-term anticoagulation  (fondaparinux contraindicated per manufacturer labeling with CrCl < 30 ml/min)   Greer Pickerel, PharmD, BCPS Clinical Pharmacist 12/04/2022,7:25 AM

## 2022-12-04 NOTE — Progress Notes (Signed)
Patient ID: Jaime Gomez, female   DOB: [birth year], 57 y.o.   MRN: [id number] Pt scheduled for port a cath removal today. Confirmed with oncology. Risks and benefits of port-a-catheter removal was discussed with the patient's sister including, but not limited to bleeding, infection, injury to adjacent structures.  All of the patient's sister's questions were answered, patient's sister is agreeable to proceed. Consent signed and in chart.

## 2022-12-04 NOTE — Procedures (Signed)
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 EEG to evaluate for seizure  Level of alertness:  lethargic   AEDs during EEG study: GBP  Technical aspects: This EEG study was done with scalp electrodes positioned according to the 10-20 International system of electrode placement. Electrical activity was reviewed with band pass filter of 1-70Hz , sensitivity of 7 uV/mm, display speed of 56mm/sec with a 60Hz  notched filter applied as appropriate. EEG data were recorded continuously and digitally stored.  Video monitoring was available and reviewed as appropriate.  Description: EEG showed continuous generalized 3 to 5 Hz theta-delta slowing. Generalized periodic discharges with triphasic morphology at 1-1.5 Hz were also noted. Hyperventilation and photic stimulation were not performed.     ABNORMALITY - Periodic discharges with triphasic morphology, generalized ( GPDs) - Continuous slow, generalized  IMPRESSION: This study showed generalized periodic discharges with triphasic morphology which can be on the ictal-interictal continuum. However, the morphology, frequency is more likely suggestive of toxic-metabolic causes. Additionally there is moderate to severe diffuse encephalopathy. No seizures were seen throughout the recording.  Jaime Gomez

## 2022-12-05 DIAGNOSIS — A419 Sepsis, unspecified organism: Secondary | ICD-10-CM | POA: Diagnosis not present

## 2022-12-05 DIAGNOSIS — I700 Atherosclerosis of aorta: Secondary | ICD-10-CM | POA: Diagnosis not present

## 2022-12-05 DIAGNOSIS — G9340 Encephalopathy, unspecified: Secondary | ICD-10-CM | POA: Diagnosis not present

## 2022-12-05 DIAGNOSIS — N289 Disorder of kidney and ureter, unspecified: Secondary | ICD-10-CM | POA: Diagnosis not present

## 2022-12-05 DIAGNOSIS — I509 Heart failure, unspecified: Secondary | ICD-10-CM | POA: Diagnosis not present

## 2022-12-05 DIAGNOSIS — N390 Urinary tract infection, site not specified: Secondary | ICD-10-CM | POA: Diagnosis not present

## 2022-12-05 DIAGNOSIS — I5021 Acute systolic (congestive) heart failure: Secondary | ICD-10-CM | POA: Diagnosis not present

## 2022-12-05 LAB — BASIC METABOLIC PANEL
Anion gap: 14 (ref 5–15)
BUN: 64 mg/dL — ABNORMAL HIGH (ref 6–20)
CO2: 16 mmol/L — ABNORMAL LOW (ref 22–32)
Calcium: 8.8 mg/dL — ABNORMAL LOW (ref 8.9–10.3)
Chloride: 113 mmol/L — ABNORMAL HIGH (ref 98–111)
Creatinine, Ser: 3.48 mg/dL — ABNORMAL HIGH (ref 0.44–1.00)
GFR, Estimated: 15 mL/min — ABNORMAL LOW (ref >60)
Glucose, Bld: 140 mg/dL — ABNORMAL HIGH (ref 70–99)
Potassium: 3.7 mmol/L (ref 3.5–5.1)
Sodium: 143 mmol/L (ref 135–145)

## 2022-12-05 LAB — GLUCOSE, CAPILLARY
Glucose-Capillary: 131 mg/dL — ABNORMAL HIGH (ref 70–99)
Glucose-Capillary: 130 mg/dL — ABNORMAL HIGH (ref 70–99)
Glucose-Capillary: 127 mg/dL — ABNORMAL HIGH (ref 70–99)
Glucose-Capillary: 129 mg/dL — ABNORMAL HIGH (ref 70–99)

## 2022-12-05 LAB — CULTURE, BLOOD (ROUTINE X 2): Special Requests: ADEQUATE

## 2022-12-05 LAB — CBC
HCT: 34.9 % — ABNORMAL LOW (ref 36.0–46.0)
Hemoglobin: 11.3 g/dL — ABNORMAL LOW (ref 12.0–15.0)
MCH: 29 pg (ref 26.0–34.0)
MCHC: 32.4 g/dL (ref 30.0–36.0)
MCV: 89.5 fL (ref 80.0–100.0)
Platelets: 208 10*3/uL (ref 150–400)
RBC: 3.9 MIL/uL (ref 3.87–5.11)
RDW: 16.2 % — ABNORMAL HIGH (ref 11.5–15.5)
WBC: 5.5 10*3/uL (ref 4.0–10.5)
nRBC: 0 % (ref 0.0–0.2)

## 2022-12-05 LAB — MAGNESIUM: Magnesium: 1.8 mg/dL (ref 1.7–2.4)

## 2022-12-05 LAB — RPR: RPR Ser Ql: NONREACTIVE

## 2022-12-05 MED ORDER — OLANZAPINE 10 MG IM SOLR
5.0000 mg | Freq: Four times a day (QID) | INTRAMUSCULAR | Status: DC | PRN
  Administered 2022-12-06 – 2022-12-07 (×2): 5 mg via INTRAMUSCULAR
  Filled 2022-12-05 (×2): qty 10

## 2022-12-05 NOTE — Evaluation (Addendum)
Clinical/Bedside Swallow Evaluation Patient Details  Name: Jaime Gomez MRN: [id number] Date of Birth: 1965/12/06  Today's Date: 12/05/2022 Time: SLP Start Time (ACUTE ONLY): 1630 SLP Stop Time (ACUTE ONLY): 1649 SLP Time Calculation (min) (ACUTE ONLY): 19 min  Past Medical History:  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 failure 08/18/2021   Diabetes mellitus without complication (HCC)    Endometrial cancer, FIGO stage IVB (HCC) 08/18/2021   Goals of care, counseling/discussion 08/18/2021   History of kidney stones    Hypertension    Leg DVT (deep venous thromboembolism), chronic, right (HCC) 08/18/2021   Pneumonia    Past Surgical History:  Past Surgical History:  Procedure Laterality Date   ABDOMINAL HYSTERECTOMY     CYSTOSCOPY W/ URETERAL STENT PLACEMENT Bilateral 09/01/2021   Procedure: CYSTOSCOPY WITH RETROGRADE PYELOGRAM/URETERAL STENT PLACEMENT;  Surgeon: Jannifer Hick, MD;  Location: [facility];  Service: Urology;  Laterality: Bilateral;   CYSTOSCOPY WITH STENT PLACEMENT Right 05/19/2022   Procedure: CYSTOSCOPY WITH STENT CHANGE;  Surgeon: Jannifer Hick, MD;  Location: [facility];  Service: Urology;  Laterality: Right;  ONLY NEEDS 30 MIN   HERNIA REPAIR     IR CONVERT LEFT NEPHROSTOMY TO NEPHROURETERAL CATH  11/02/2021   IR CV LINE INJECTION  08/23/2021   IR IMAGING GUIDED PORT INSERTION  11/14/2021   IR NEPHROSTOMY EXCHANGE LEFT  11/02/2021   IR NEPHROSTOMY EXCHANGE LEFT  01/20/2022   IR NEPHROSTOMY EXCHANGE LEFT  11/29/2022   IR NEPHROSTOMY PLACEMENT LEFT  09/03/2021   IR REMOVAL TUN ACCESS W/ PORT W/O FL MOD SED  11/14/2021   IR REMOVAL TUN ACCESS W/ PORT W/O FL MOD SED  12/04/2022   HPI:  Jaime Gomez is a 57 y.o. female with a history of hypertension, type 2 diabetes mellitus, CKD stage IV with bilateral hydronephrosis s/p left nephrostomy tube, chronic right lower extremity DVT on Arixtra, and stage IV endometrial cancer on Afinitor/ Letrozole who was  admitted on 11/28/2022 for sepsis secondary to UTI. Cardiology consulted on 12/05/2022 for new CHF at the request of Dr. Nelson Chimes.  Pt with AMS suddenly and MRI showed subacute left frontoparietal infarcts with  additional punctate acute infarcts in the cerebellum and bilateral occipital lobes. CXR concerning for potential left pna.     Swallow eval ordered.  Sister, [name], reports pt has no prior history of dysphagia.    Assessment / Plan / Recommendation  Clinical Impression  Pt's largest barrier to po at this time appears to be her mentation.  She did not follow directions nor open her mouth to verbal/tactile cue.  SLP did place a small amount of [place] Ice on a spoon with oral manipulation and swallow observed.  However pt did not accept any further trials including apple juice from tip of straw nor [place] ice - and only repeatedly moaned.  She opened her eyes at the end of the evaluation, looked toward SLP - moaned and turned away from this therapist.  Frequent excessive movement c/w "jerking" of arms and appearance of right labial movement when awoken/stimulated.  Her sister stated she thought Loralee wanted to be left alone.  Anticipate as mentation improves, pt's swallow function will be adequate.  Her voice with moaning is strong and given left frontopareital involvement from CVA (cerebellar and bilateral occipital punctate cva also), pharyngeal deficit likely not present.  Advised pt's sister to allow her to have tiny bite of [place] Ice if she expresses desire for po - otherwise  NPO and SLP will follow up 5/15 late am. Pt's sitter obtained [place] suction cathether and placed it per SLP request (Thank you).  Hopeful for improvement in swallow/mentation given report of expected improvement day 3 after ABX stopped.   Thanks. SLP Visit Diagnosis: Other (comment);Dysphagia, unspecified (R13.10)    Aspiration Risk  Risk for inadequate nutrition/hydration    Diet Recommendation NPO;Other (Comment) (tiny  bites of [place] ice if pt desires, oral suction if she starts coughing)   Medication Administration: Other (Comment) (IV) Postural Changes: Seated upright at 90 degrees;Remain upright for at least 30 minutes after po intake    Other  Recommendations Oral Care Recommendations: Oral care BID Caregiver Recommendations: Have oral suction available    Recommendations for follow up therapy are one component of a multi-disciplinary discharge planning process, led by the attending physician.  Recommendations may be updated based on patient status, additional functional criteria and insurance authorization.  Follow up Recommendations Follow physician's recommendations for discharge plan and follow up therapies      Assistance Recommended at Discharge    Functional Status Assessment Patient has had a recent decline in their functional status and demonstrates the ability to make significant improvements in function in a reasonable and predictable amount of time.  Frequency and Duration min 1 x/week  1 week       Prognosis Prognosis for improved oropharyngeal function: Good      Swallow Study   General Date of Onset: 12/05/22 HPI: Jaime Gomez is a 57 y.o. 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 Cardiology consulted on 12/05/2022 for new CHF at the request of Dr. Nelson Chimes.  Pt with AMS suddenly and MRI showed subacute left frontoparietal infarcts with  additional punctate acute infarcts in the cerebellum and bilateral occipital lobes. CXR concerning for potential left pna.     Swallow eval ordered.  Sister, [name], reports pt has no prior history of dysphagia. Type of Study: Bedside Swallow Evaluation Previous Swallow Assessment: none Diet Prior to this  Study: NPO Temperature Spikes Noted: No Respiratory Status: Room air History of Recent Intubation: No Behavior/Cognition: Other (Comment);Lethargic/Drowsy (pt does not follow directions) Oral Cavity Assessment: Other (comment) (pt did not open mouth to allow view) Oral Care Completed by SLP: No Vision:  (pt looked to slp and immediately moaned and turned away from SLP) Self-Feeding Abilities: Total assist Patient Positioning: Upright in bed Baseline Vocal Quality: Normal;Other (comment) (pt's moaning was strong) Volitional Cough: Cognitively unable to elicit Volitional Swallow: Unable to elicit    Oral/Motor/Sensory Function Overall Oral Motor/Sensory Function:  (pt did not follow directions)   Ice Chips Other Comments: very small amount of [place] ice placed at anterior mouth - swallow was observed   Thin Liquid Other Comments: tip of straw  with liquid placed to lips - pt moaned and turned her head away from SLP - did not accept    Nectar Thick Nectar Thick Liquid: Not tested   Honey Thick Honey Thick Liquid: Not tested   Puree Puree: Not tested   Solid     Solid: Not tested      Chales Abrahams 12/05/2022,5:27 PM  Rolena Infante, MS [facility] SLP Acute Rehab Services Office [phone]

## 2022-12-05 NOTE — Progress Notes (Signed)
PT Cancellation Note  Patient Details Name: [name] MRN: [id number] DOB: 05-Aug-1965   Cancelled Treatment:    Reason Eval/Treat Not Completed: Fatigue/lethargy limiting ability to participate. Sitter in room reports pt has been asleep since receiving pain medication from RN. Attempted to wake pt with sternal rub and loud voice, pt with audible snoring. Will continue to follow.    Domenick Bookbinder PT, DPT 12/05/22, 12:34 PM

## 2022-12-05 NOTE — Consult Note (Addendum)
Cardiology Consultation   Patient ID: Jaime Gomez MRN: [id number]; DOB: 12-06-1965  Admit date: 11/28/2022 Date of Consult: 12/05/2022  PCP:  Staci Acosta, FNP   [facility] Providers Cardiologist:  New (Dr. Royann Shivers)    Patient Profile:   Jaime Gomez is a 57 y.o. 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 Cardiology consulted on 12/05/2022 for new CHF at the request of Dr. Nelson Chimes.   History of Present Illness:   Jaime Gomez is a 57 year old female with the above history. No known prior cardiac history. She has stage IV endometrial cancer and is currently on Afinitor/ Letrozole. She has a history of chronic iron deficiency anemia requiring intermittent iron and PRBC transfusions as a as ann outpatient (most recently 1 week prior to admission). She also has known bilateral hydronephrosis and has a left nephrostomy tube.  Patient presented to the ED on 11/28/2022 for fever and weakness and was found to be hypotensive and tachycardia. She was asdmitted for sepsis secondary to UTI as well as AKI superimposed on CKD with creatinine of 4.92 on admission. She underwent nephrostomy tube exchange on 5/8. Blood cultures grew pseudomonas. She was started on IV antibiotics and well as IV fluids and sodium bicarb. Hospitalization has been complicated AKI, metabolic encephalopathy, acute vs subactue CVA, and acute on chronic anemia requiring blood transfusion. She was initially started on Cefepime but there was concern that this may be causing neurotoxicity so she was switched to Meropenum; however, continued to have worsening altered mental status. Initially Oncology and family decided to leave Port-A-Cath in but it was eventually taken out on 5/13  given worsening medical condition and bacteremia. 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 Neurology was consulted and felt etiology was likely cardioembolic vs secondary hypercoagulable state of malignancy vs atheroembolic from potential critical vessel stenosis. Echo was ordered and showed LVEF of 30-35% with global hypokinesis and apical akinesis as well as grade 1 diastolic dysfunction. Therefore, Cardiology was consulted for further evaluation.  At the time of this evaluation, patient is resting comfortably in no acute distress. She still has significant encephalopathy so history obtained from twin sister who is at bedside. Patient lives with her sister. Sister states she was diagnosed with endometrial cancer about 10 years ago. She was previously receiving radiation but patient stopped this about 5 weeks ago. She is still being treated with oral chemotherapy. Sister states she started having an off an on fever on 11/24/2022 but family thought she was getting better as she went a full 24 hours without any fever. However, then on 11/28/2022 she developed significant weakness which is when family decided to bring her to the hospital. Sister states she does intermittently have word finding issues. She also describes an episode a few weeks ago where she had aphasia where it sounded like "gibberish" when she went to talk and some mild confusion. Sister was concerned for a stroke at that time and tried to convince her to go to the ED but patient refused. Symptoms resolved on their own but sister states she has had more word finding issues since then. Per sister, patient  has not complained of any chest pain, shortness of breath, or palpitations. She may have some dizziness that she describes more as an unsteadiness on her feet. No  syncope. She has some chronic intermittent lower extremity swelling but this is stable. She has intermittent nausea/ vomiting and diarrhea but sister states this is not new and seems stable. No abnormal bleeding in urine or stools.  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 failure 08/18/2021   Diabetes mellitus without complication (HCC)    Endometrial cancer, FIGO stage IVB (HCC) 08/18/2021   Goals of care, counseling/discussion 08/18/2021   History of kidney stones    Hypertension    Leg DVT (deep venous thromboembolism), chronic, right (HCC) 08/18/2021   Pneumonia     Past Surgical History:  Procedure Laterality Date   ABDOMINAL HYSTERECTOMY     CYSTOSCOPY W/ URETERAL STENT PLACEMENT Bilateral 09/01/2021   Procedure: CYSTOSCOPY WITH RETROGRADE PYELOGRAM/URETERAL STENT PLACEMENT;  Surgeon: Jannifer Hick, MD;  Location: [facility];  Service: Urology;  Laterality: Bilateral;   CYSTOSCOPY WITH STENT PLACEMENT Right 05/19/2022   Procedure: CYSTOSCOPY WITH STENT CHANGE;  Surgeon: Jannifer Hick, MD;  Location: [facility];  Service: Urology;  Laterality: Right;  ONLY NEEDS 30 MIN   HERNIA REPAIR     IR CONVERT LEFT NEPHROSTOMY TO NEPHROURETERAL CATH  11/02/2021   IR CV LINE INJECTION  08/23/2021   IR IMAGING GUIDED PORT INSERTION  11/14/2021   IR NEPHROSTOMY EXCHANGE LEFT  11/02/2021   IR NEPHROSTOMY EXCHANGE LEFT  01/20/2022   IR NEPHROSTOMY EXCHANGE LEFT  11/29/2022   IR NEPHROSTOMY PLACEMENT LEFT  09/03/2021   IR REMOVAL TUN ACCESS W/ PORT W/O FL MOD SED  11/14/2021   IR REMOVAL TUN ACCESS W/ PORT W/O FL MOD SED  12/04/2022     Home Medications:  Prior to Admission medications   Medication Sig Start Date End Date Taking? Authorizing Provider  acetaminophen (TYLENOL) 500 MG tablet Take 500 mg by mouth every 6 (six) hours as needed for mild pain.   Yes [provider]  carvedilol (COREG) 12.5 MG tablet Take 1 tablet (12.5 mg total) by mouth 2 (two) times daily with a  meal. 08/30/22  Yes Ennever, Rose Phi, MD  everolimus (AFINITOR) 10 MG tablet Take 10 mg by mouth daily. 11/25/22  Yes [provider]  folic acid (FOLVITE) 1 MG tablet Take 1 mg by mouth daily at 6 (six) AM. 10/11/20  Yes [provider]  fondaparinux (ARIXTRA) 7.5 MG/0.6ML SOLN injection Inject 0.6 mLs (7.5 mg total) into the skin daily. 03/10/22  Yes Ennever, Rose Phi, MD  gabapentin (NEURONTIN) 100 MG capsule Take 1 capsule (100 mg total) by mouth 2 (two) times daily. 08/30/22  Yes Ennever, Rose Phi, MD  insulin aspart protamine - aspart (NOVOLOG MIX 70/30 FLEXPEN) (70-30) 100 UNIT/ML FlexPen Inject 20 Units into the skin as directed. Per meal 01/19/21  Yes [provider]  insulin detemir (LEVEMIR) 100 UNIT/ML injection Inject 0.2 mLs (20 Units total) into the skin 2 (two) times daily. 08/30/22  Yes [name], MD  mirabegron ER (MYRBETRIQ) 25 MG TB24 tablet Take 1 tablet (25 mg total) by mouth daily for 360 doses. 09/02/21 11/29/22 Yes Jannifer Hick, MD  oxybutynin (DITROPAN-XL) 10 MG 24 hr tablet Take 10 mg by mouth daily. 08/27/22  Yes [provider]  oxycodone (OXY-IR) 5 MG capsule Take 1 capsule (5 mg total) by mouth every 4 (four) hours  as needed for pain. 10/13/22  Yes [name], MD  prochlorperazine (COMPAZINE) 10 MG tablet Take 10 mg by mouth every 6 (six) hours as needed for nausea or vomiting. 08/18/21  Yes [provider]  nitrofurantoin, macrocrystal-monohydrate, (MACROBID) 100 MG capsule TAKE 1 CAPSULE BY MOUTH TWICE A DAY 12/04/22   [name], MD  polyethylene glycol (MIRALAX / GLYCOLAX) 17 g packet Take 17 g by mouth daily as needed for mild constipation. 09/05/21   Pokhrel, Rebekah Chesterfield, MD    Inpatient Medications: Scheduled Meds:   stroke: early stages of recovery book   Does not apply Once   acetaminophen  1,000 mg Oral Once   carvedilol  12.5 mg Oral BID WC   Chlorhexidine Gluconate Cloth  6 each Topical Daily   folic acid  1 mg  Oral Q0600   gabapentin  100 mg Oral BID   insulin aspart  0-9 Units Subcutaneous TID WC   labetalol  5 mg Intravenous Q6H   mirtazapine  7.5 mg Oral QHS   sodium bicarbonate  650 mg Oral TID   Continuous Infusions:  meropenem (MERREM) IV Stopped (12/04/22 2223)   PRN Meds: acetaminophen **OR** acetaminophen, guaiFENesin, hydrALAZINE, HYDROmorphone (DILAUDID) injection, ipratropium-albuterol, methocarbamol, metoprolol tartrate, ondansetron **OR** ondansetron (ZOFRAN) IV, oxyCODONE, polyethylene glycol, senna-docusate, sorbitol  Allergies:    Allergies  Allergen Reactions   Taxotere [Docetaxel] Swelling    Throat swelling    Social History:   Social History   Socioeconomic History   Marital status: Single    Spouse name: Not on file   Number of children: Not on file   Years of education: Not on file   Highest education level: Not on file  Occupational History   Not on file  Tobacco Use   Smoking status: Never   Smokeless tobacco: Never  Vaping Use   Vaping Use: Never used  Substance and Sexual Activity   Alcohol use: Not Currently   Drug use: Never   Sexual activity: Not on file  Other Topics Concern   Not on file  Social History Narrative   Not on file   Social Determinants of Health   Financial Resource Strain: Low Risk  (06/27/2022)   Overall Financial Resource Strain (CARDIA)    Difficulty of Paying Living Expenses: Not hard at all  Food Insecurity: No Food Insecurity (11/28/2022)   Hunger Vital Sign    Worried About Running Out of Food in the Last Year: Never true    Ran Out of Food in the Last Year: Never true  Transportation Needs: No Transportation Needs (11/28/2022)   PRAPARE - Administrator, Civil Service (Medical): No    Lack of Transportation (Non-Medical): No  Physical Activity: Not on file  Stress: No Stress Concern Present (06/27/2022)   [employer] of Occupational Health - Occupational Stress Questionnaire    Feeling of Stress :  Not at all  Social Connections: Not on file  Intimate Partner Violence: Not At Risk (11/28/2022)   Humiliation, Afraid, Rape, and Kick questionnaire    Fear of Current or Ex-Partner: No    Emotionally Abused: No    Physically Abused: No    Sexually Abused: No    Family History:    Family History  Problem Relation Age of Onset   Cancer Maternal Grandmother      ROS:  Please see the history of present illness.  Review of Systems  Unable to perform ROS: Mental status change   \\  Physical Exam/Data:   Vitals:   12/04/22 1700 12/04/22 1818 12/04/22 2119 12/05/22 0632  BP: (!) 165/93  (!) 167/109 (!) 150/94  Pulse: 92  89 87  Resp: 20  18 18   Temp: (!) 97.2 F (36.2 C) 98.6 F (37 C)  99.4 F (37.4 C)  TempSrc: Axillary Rectal  Axillary  SpO2: 100%  98% 99%  Weight:      Height:        Intake/Output Summary (Last 24 hours) at 12/05/2022 1004 Last data filed at 12/05/2022 0956 Gross per 24 hour  Intake 192.16 ml  Output 2550 ml  Net -2357.84 ml      12/03/2022    7:07 PM 11/28/2022    8:19 AM 11/16/2022    3:10 PM  Last 3 Weights  Weight (lbs) 134 lb 134 lb 134 lb  Weight (kg) 60.782 kg 60.782 kg 60.782 kg     Body mass index is 22.3 kg/m.  General: 57 y.o. Caucasian female resting comfortably in no acute distress. HEENT: Normocephalic and atraumatic. Sclera clear.  Neck: Supple. No JVD. Heart: Borderline tachycardic with regular rhythm. Distinct S1 and S2. No murmurs, gallops, or rubs.  Lungs: No increased work of breathing. Clear to ausculation bilaterally. No wheezes, rhonchi, or rales.  Abdomen: Soft, non-distended, and non-tender to palpation.  Extremities: No lower extremity edema.    Skin: Warm and dry. Neuro: Encephalopathic. Briefly opened eyes but did not follow commands. No gross focal deficits. Psych: Encephalopathic.    EKG:  The EKG was personally reviewed and demonstrates:  Sinus tachycardia, rate 117  bpm, with diffuse T wave inversions in leads  I, II, aVL, and V4-V6. Telemetry:  Telemetry was personally reviewed and demonstrates:  Normal sinus rhythm with baseline rates in the 70s. Rates have been more in the 90s to low 100s the last couple of hours.  Relevant CV Studies:  Echocardiogram 12/04/2022: Impressions: 1. Global hypokinesis with apical akinesis; overall moderate LV  dysfunction.   2. Left ventricular ejection fraction, by estimation, is 30 to 35%. The  left ventricle has moderately decreased function. The left ventricle  demonstrates regional wall motion abnormalities (see scoring  diagram/findings for description). The left  ventricular internal cavity size was mildly dilated. Left ventricular  diastolic parameters are consistent with Grade I diastolic dysfunction  (impaired relaxation).   3. Right ventricular systolic function is normal. The right ventricular  size is normal.   4. The mitral valve is normal in structure. Trivial mitral valve  regurgitation. No evidence of mitral stenosis.   5. The aortic valve is tricuspid. Aortic valve regurgitation is not  visualized. No aortic stenosis is present.   6. The inferior vena cava is normal in size with greater than 50%  respiratory variability, suggesting right atrial pressure of 3 mmHg.   Comparison(s): No prior Echocardiogram.    Laboratory Data:  High Sensitivity Troponin:  No results for input(s): "TROPONINIHS" in the last 720 hours.   Chemistry Recent Labs  Lab 12/03/22 0255 12/04/22 0233 12/05/22 0637  NA 132* 139 143  K 3.5 3.4* 3.7  CL 116* 110 113*  CO2 13* 15* 16*  GLUCOSE 98 203* 140*  BUN 71* 73* 64*  CREATININE 3.85* 3.65* 3.48*  CALCIUM 7.4* 8.2* 8.8*  MG 2.0 1.8 1.8  GFRNONAA 13* 14* 15*  ANIONGAP 3* 14 14    Recent Labs  Lab 11/29/22 0403  PROT 6.3*  ALBUMIN 2.3*  AST 76*  ALT 11  ALKPHOS 82  BILITOT 0.5   Lipids  Recent Labs  Lab 12/04/22 0232  CHOL 162  TRIG 199*  HDL 29*  LDLCALC 93  CHOLHDL 5.6     Hematology Recent Labs  Lab 12/03/22 0255 12/04/22 0233 12/05/22 0637  WBC 4.7 4.1 5.5  RBC 3.40* 3.41* 3.90  HGB 9.8* 9.9* 11.3*  HCT 30.6* 30.9* 34.9*  MCV 90.0 90.6 89.5  MCH 28.8 29.0 29.0  MCHC 32.0 32.0 32.4  RDW 16.1* 16.0* 16.2*  PLT 181 187 208   Thyroid  Recent Labs  Lab 12/03/22 1335  TSH 2.520    BNP Recent Labs  Lab 12/03/22 0255  BNP 2,665.1*    DDimer No results for input(s): "DDIMER" in the last 168 hours.   Radiology/Studies:  IR REMOVAL TUN ACCESS W/ PORT W/O FL MOD SED  Result Date: 12/04/2022 CLINICAL DATA:  Sepsis.  Pseudomonas bacteremia. EXAM: REMOVAL OF IMPLANTED TUNNELED PORT-A-CATH MEDICATIONS: 10 mL lidocaine 1%. ANESTHESIA/SEDATION: Local anesthetic was administered. FLUOROSCOPY TIME:  None PROCEDURE: Informed written consent was obtained from the patient and/or patient's representative after a discussion of the risk, benefits and alternatives to the procedure. The patient was positioned supine on the fluoroscopy table and the RIGHT chest Port-A-Cath site was prepped with chlorhexidine. A sterile gown and gloves were worn during the procedure. Local anesthesia was provided with 1% lidocaine with epinephrine. A timeout was performed prior to the initiation of the procedure. An incision was made overlying the Port-A-Cath with a #15 scalpel. Utilizing sharp and blunt dissection, the Port-A-Cath was removed completely. The pocked was irrigated with sterile saline. Wound closure was performed with interrupted subcutaneous 2-0 Vicryl sutures then Dermabond was applied at the skin. Dressings were applied. The patient tolerated the procedure well without immediate post procedural complication. FINDINGS: Removal of implant Port-A-Cath without immediate post procedural complication. IMPRESSION: Successful removal of an implanted RIGHT chest Port-A-Cath. Roanna Banning, MD [facility] Electronically Signed    By: Roanna Banning M.D.   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 EEG to evaluate for seizure Level of alertness:  lethargic AEDs during EEG study: GBP Technical aspects: This EEG study was done with scalp electrodes positioned according to the 10-20 International system of electrode placement. Electrical activity was reviewed with band pass filter of 1-70Hz , sensitivity of 7 uV/mm, display speed of 102mm/sec with a 60Hz  notched filter applied as appropriate. EEG data were recorded continuously and digitally stored.  Video monitoring was available and reviewed as appropriate. Description: EEG showed continuous generalized 3 to 5 Hz theta-delta slowing. Generalized periodic discharges with triphasic morphology at 1-1.5 Hz were also noted. Hyperventilation and photic stimulation were not performed.   ABNORMALITY - Periodic discharges with triphasic morphology, generalized ( GPDs) - Continuous slow, generalized IMPRESSION: This study showed generalized periodic discharges with triphasic morphology which can be on the ictal-interictal continuum. However, the morphology, frequency is more likely suggestive of toxic-metabolic causes. Additionally there is moderate to severe  diffuse encephalopathy. No seizures were seen throughout the recording. Priyanka O Yadav   VAS US CAROTID  Result Date: 12/04/2022 Carotid Arterial Duplex Study Patient  Name:  CHEZNIE PENDERGRASS  Date of Exam:   12/04/2022 Medical Rec #: [id number]     Accession #:    [id number] Date of Birth: 02-Jul-1966     Patient Gender: F Patient Age:   39 years Exam Location:  [facility] Procedure:      VAS US CAROTID Referring Phys: Stephania Fragmin --------------------------------------------------------------------------------  Indications:       CVA. Risk Factors:      Hypertension, Diabetes, CKD, no history of smoking. Limitations        Today's exam was limited due to patient unable to follow                    commands / position properly. Comparison Study:  No previous exams Performing Technologist: Jody Hill RVT, RDMS  Examination Guidelines: A complete evaluation includes B-mode imaging, spectral Doppler, color Doppler, and power Doppler as needed of all accessible portions of each vessel. Bilateral testing is considered an integral part of a complete examination. Limited examinations for reoccurring indications may be performed as noted.  Right Carotid Findings: +----------+--------+--------+--------+------------------+------------------+           PSV cm/sEDV cm/sStenosisPlaque DescriptionComments           +----------+--------+--------+--------+------------------+------------------+ CCA Prox  46      11                                intimal thickening +----------+--------+--------+--------+------------------+------------------+ CCA Distal51      16                                                   +----------+--------+--------+--------+------------------+------------------+ ICA Prox  50      19                                                   +----------+--------+--------+--------+------------------+------------------+ ICA Distal55      14                                                   +----------+--------+--------+--------+------------------+------------------+ ECA       55      6                                                     +----------+--------+--------+--------+------------------+------------------+ +----------+--------+-------+----------------+-------------------+           PSV cm/sEDV cmsDescribe        Arm Pressure (mmHG) +----------+--------+-------+----------------+-------------------+ [id number]             Multiphasic, WNL                    +----------+--------+-------+----------------+-------------------+ +---------+--------+--+--------+-+--------------+ VertebralPSV cm/s24EDV cm/s3High resistant +---------+--------+--+--------+-+--------------+  Left Carotid  Findings: +----------+--------+--------+--------+------------------+------------------+           PSV cm/sEDV cm/sStenosisPlaque DescriptionComments           +----------+--------+--------+--------+------------------+------------------+ CCA Prox  56      8                                                    +----------+--------+--------+--------+------------------+------------------+ CCA Distal56      8                                 intimal thickening +----------+--------+--------+--------+------------------+------------------+ ICA Prox  54      16                                                   +----------+--------+--------+--------+------------------+------------------+ ICA Distal64      17                                                   +----------+--------+--------+--------+------------------+------------------+ ECA       60      6                                                    +----------+--------+--------+--------+------------------+------------------+ +----------+--------+--------+----------------+-------------------+           PSV cm/sEDV cm/sDescribe        Arm Pressure (mmHG) +----------+--------+--------+----------------+-------------------+ [id number]              Multiphasic, WNL                    +----------+--------+--------+----------------+-------------------+  +---------+--------+--+--------+-+---------+ VertebralPSV cm/s62EDV cm/s7Antegrade +---------+--------+--+--------+-+---------+   Summary: Right Carotid: The extracranial vessels were near-normal with only minimal wall                thickening or plaque. Left Carotid: The extracranial vessels were near-normal with only minimal wall               thickening or plaque. Vertebrals:  Left vertebral artery demonstrates antegrade flow. Right vertebral              artery demonstrates high resistant flow. Subclavians: Normal flow hemodynamics were seen in bilateral subclavian              arteries. *See table(s) above for measurements and observations.     Preliminary    ECHOCARDIOGRAM COMPLETE  Result Date: 12/04/2022    ECHOCARDIOGRAM REPORT   Patient Name:   KASH PEZZANO Date of Exam: 12/04/2022 Medical Rec #:  [id number]    Height:       65.0 in Accession #:    [id number]   Weight:       134.0 lb Date of Birth:  30-Jul-1965    BSA:          1.669 m Patient Age:    56 years     BP:  145/78 mmHg Patient Gender: F            HR:           86 bpm. Exam Location:  Inpatient Procedure: 2D Echo, Color Doppler and Cardiac Doppler Indications:    Bacteremia  History:        Patient has no prior history of Echocardiogram examinations.                 Risk Factors:Hypertension and Diabetes. Endometrial Cancer, DVT,                 CKD, Sepsis.  Sonographer:    Milbert Coulter Referring Phys: [id number] [facility] IMPRESSIONS  1. Global hypokinesis with apical akinesis; overall moderate LV dysfunction.  2. Left ventricular ejection fraction, by estimation, is 30 to 35%. The left ventricle has moderately decreased function. The left ventricle demonstrates regional wall motion abnormalities (see scoring diagram/findings for description). The left ventricular internal cavity size was mildly dilated. Left ventricular diastolic parameters are consistent with Grade I diastolic dysfunction (impaired relaxation).  3.  Right ventricular systolic function is normal. The right ventricular size is normal.  4. The mitral valve is normal in structure. Trivial mitral valve regurgitation. No evidence of mitral stenosis.  5. The aortic valve is tricuspid. Aortic valve regurgitation is not visualized. No aortic stenosis is present.  6. The inferior vena cava is normal in size with greater than 50% respiratory variability, suggesting right atrial pressure of 3 mmHg. Comparison(s): No prior Echocardiogram. FINDINGS  Left Ventricle: Left ventricular ejection fraction, by estimation, is 30 to 35%. The left ventricle has moderately decreased function. The left ventricle demonstrates regional wall motion abnormalities. The left ventricular internal cavity size was mildly dilated. There is no left ventricular hypertrophy. Left ventricular diastolic parameters are consistent with Grade I diastolic dysfunction (impaired relaxation). Right Ventricle: The right ventricular size is normal. Right ventricular systolic function is normal. Left Atrium: Left atrial size was normal in size. Right Atrium: Right atrial size was normal in size. Pericardium: Trivial pericardial effusion is present. Mitral Valve: The mitral valve is normal in structure. Trivial mitral valve regurgitation. No evidence of mitral valve stenosis. Tricuspid Valve: The tricuspid valve is normal in structure. Tricuspid valve regurgitation is not demonstrated. No evidence of tricuspid stenosis. Aortic Valve: The aortic valve is tricuspid. Aortic valve regurgitation is not visualized. No aortic stenosis is present. Aortic valve mean gradient measures 3.0 mmHg. Aortic valve peak gradient measures 6.0 mmHg. Aortic valve area, by VTI measures 2.74 cm. Pulmonic Valve: The pulmonic valve was normal in structure. Pulmonic valve regurgitation is not visualized. No evidence of pulmonic stenosis. Aorta: The aortic root is normal in size and structure. Venous: The inferior vena cava is normal in  size with greater than 50% respiratory variability, suggesting right atrial pressure of 3 mmHg. IAS/Shunts: No atrial level shunt detected by color flow Doppler. Additional Comments: Global hypokinesis with apical akinesis; overall moderate LV dysfunction.  LEFT VENTRICLE PLAX 2D LVIDd:         5.40 cm     Diastology LVIDs:         3.80 cm     LV e' medial:    4.79 cm/s LV PW:         0.90 cm     LV E/e' medial:  13.2 LV IVS:        0.90 cm     LV e' lateral:   3.59 cm/s LVOT diam:  2.10 cm     LV E/e' lateral: 17.7 LV SV:         59 LV SV Index:   35 LVOT Area:     3.46 cm  LV Volumes (MOD) LV vol d, MOD A2C: 80.0 ml LV vol d, MOD A4C: 78.0 ml LV vol s, MOD A2C: 64.6 ml LV vol s, MOD A4C: 44.5 ml LV SV MOD A2C:     15.4 ml LV SV MOD A4C:     78.0 ml LV SV MOD BP:      27.8 ml RIGHT VENTRICLE RV Basal diam:  2.60 cm RV Mid diam:    2.50 cm RV S prime:     10.60 cm/s TAPSE (M-mode): 1.9 cm LEFT ATRIUM             Index        RIGHT ATRIUM          Index LA diam:        3.20 cm 1.92 cm/m   RA Area:     5.91 cm LA Vol (A2C):   35.7 ml 21.40 ml/m  RA Volume:   7.95 ml  4.76 ml/m LA Vol (A4C):   40.6 ml 24.33 ml/m LA Biplane Vol: 38.7 ml 23.19 ml/m  AORTIC VALVE AV Area (Vmax):    2.90 cm AV Area (Vmean):   2.65 cm AV Area (VTI):     2.74 cm AV Vmax:           122.00 cm/s AV Vmean:          80.100 cm/s AV VTI:            0.215 m AV Peak Grad:      6.0 mmHg AV Mean Grad:      3.0 mmHg LVOT Vmax:         102.00 cm/s LVOT Vmean:        61.300 cm/s LVOT VTI:          0.170 m LVOT/AV VTI ratio: 0.79  AORTA Ao Root diam: 3.20 cm Ao Asc diam:  3.40 cm MITRAL VALVE MV Area (PHT): 3.77 cm    SHUNTS MV Decel Time: 201 msec    Systemic VTI:  0.17 m MV E velocity: 63.40 cm/s  Systemic Diam: 2.10 cm MV A velocity: 85.30 cm/s MV E/A ratio:  0.74 Olga Millers MD Electronically signed by Olga Millers MD Signature Date/Time: 12/04/2022/4:08:12 PM    Final    MR ANGIO HEAD WO CONTRAST  Result Date: 12/04/2022 CLINICAL  DATA:  Stroke/TIA, determine embolic source. EXAM: MRA HEAD WITHOUT CONTRAST TECHNIQUE: Angiographic images of the Circle of Willis were acquired using MRA technique without intravenous contrast. COMPARISON:  Head MRI 12/04/2022 FINDINGS: The study is motion degraded, particularly at the level of the circle of Willis and more superiorly. Anterior circulation: The internal carotid arteries are patent from skull base to carotid termini with suboptimal assessment of the supraclinoid segments but without evidence of a significant stenosis more proximally. ACAs and MCAs are grossly patent, however motion artifact precludes detailed assessment of the A1, M1, M2, and proximal A2 segments. No large aneurysm is identified. Posterior circulation: The included intracranial portion of the left vertebral artery is widely patent to the basilar and strongly dominant. The distal right vertebral artery is small and not well evaluated, likely congenitally hypoplastic. Patent bilateral PICA, left AICA, and bilateral SCA origins are visualized. The basilar artery is widely patent. The right PCA is patent without evidence  a flow limiting proximal stenosis. The left PCA is poorly visualized. Comparing with today's earlier head MRI, there is likely a fetal origin of the left PCA. Faint signal is present in the left posterior communicating artery and proximal left P2 segment with assessment limited by artifact. No aneurysm is identified. Anatomic variants: Suspected fetal left PCA. IMPRESSION: Motion degraded examination, particularly severe at the level of the circle of Willis. No gross large vessel occlusion in the anterior circulation. Poor evaluation of the left PCA. Electronically Signed   By: Sebastian Ache M.D.   On: 12/04/2022 16:00   MR BRAIN WO CONTRAST  Result Date: 12/04/2022 CLINICAL DATA:  Mental status change, unknown cause EXAM: MRI HEAD WITHOUT CONTRAST TECHNIQUE: Multiplanar, multiecho pulse sequences of the brain and  surrounding structures were obtained without intravenous contrast. COMPARISON:  CT head Nov 30, 2022. FINDINGS: Brain: Multiple areas of restricted diffusion and T2/FLAIR hyperintensity in the left frontoparietal white matter. Additional punctate acute infarcts in the left cerebellum and left occipital lobe. Punctate acute infarct in the right occipital lobe. No evidence of acute hemorrhage, mass effect or midline shift. Trace amount of prior hemorrhage associated with the left frontal parietal infarcts. No hydrocephalus. Vascular: Major arterial flow voids are maintained at the skull base. Skull and upper cervical spine: Normal marrow signal. Sinuses/Orbits: Left maxillary sinus mucosal thickening. No acute orbital findings. IMPRESSION: 1. 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 Given involvement of multiple vascular territories, consider an embolic etiology. 2. No significant mass effect. Electronically Signed   By: Feliberto Harts M.D.   On: 12/04/2022 10:29   DG Chest Port 1 View  Result Date: 12/03/2022 CLINICAL DATA:  Dyspnea.  Fever. EXAM: PORTABLE CHEST 1 VIEW COMPARISON:  Nov 28, 2022 FINDINGS: Stable right Port-A-Cath in good position. No pneumothorax. The right lung is clear. The cardiomediastinal silhouette is stable. New hazy opacity in the left perihilar region. IMPRESSION: New hazy opacity in the left perihilar region could represent developing pneumonia. Recommend short-term follow-up imaging to ensure resolution. Electronically Signed   By: Gerome Sam III M.D.   On: 12/03/2022 10:26     Assessment and Plan:   New Diagnosed HFrEF Patient was admitted with sepsis secondary to UTI. Echo this admission showed LVEF of 30-35% with global hypokinesis and apical akinesis as well as grade 1 diastolic dysfunction. BNP elevated at 2,665. Chest x-ray on 12/03/2022 showed new hazy opacity in the  left perihilar region which could represent developing pneumonia but no overt edema. - Euvolemic on exam. - Will hold off on any diuretics given no evidence of significant volume overload and sepsis. - GDMT is limited by renal function. She is also currently NPO given encephalopathy. When she is able to resume PO medications, can restart Coreg and add Hydralazine/Imdur. No ACEi/ARB/ARNI, MRA, or SGLT2 inhibitor right now given renal function. - Unclear etiology. Unable to get history from patient right now given altered mental status but sister states she has not complained of any chest pain or shortness of breath. No known CAD. Not a candidate for any ischemic evaluation right now anyway given renal function and acute illness. Possibly secondary to acute illnesses/ sepsis. However, she is also on oral chemotherapy for her endometrial cancer (Afinitor/ Letrozole). Per brief review of literature, it does look like Afinitor and aromatase inhibitors have the potential to cause cardiomyopathy. Will review further with MD; however, if chemotherapy is felt to be  the cause of her reduced EF, may need to discuss with Oncology about different treatment options.   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 Currently on IV Heparin - Anemia of chronic disease - Type 2 diabetes mellitus     Risk Assessment/Risk Scores:    For questions or updates, please contact [facility] Please consult [email or web address] for contact info under    Signed, Corrin Parker, PA-C  12/05/2022 10:04 AM   I have seen and examined the patient along with Corrin Parker, PA-C .  I have reviewed the chart, notes and new data.  I agree with PA/NP's note.  Key new  complaints: Unable to obtain any information from patient.  She is obtunded and moaning in pain.  History obtained from chart and discussion with her sister. Key examination changes: Mildly tachycardic, no jugular venous distention, no peripheral edema, clear lungs.  There may be a third heart sound/summation gallop, but I am not sure.  There are no murmurs. Key new findings / data: BNP is markedly elevated.  ECG shows new anterolateral T wave inversion and prolonged QT interval compared with previous tracings.  Echo shows apical ballooning pattern strongly suggestive of Takotsubo syndrome.  I think the EF is underestimated and is closer to 40%.  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 There are multiple possible triggers for this, since she has numerous serious comorbid conditions and has received a lot of bad news recently, but most likely the cardiomyopathy is related to Pseudomonas sepsis. It is generally difficult to distinguish acute coronary insufficiency from Takotsubo syndrome, she does not have a previous history of coronary or peripheral vascular problems.  There is some scanty atherosclerosis in the abdominal aorta.   The regional pattern of her left ventricular dysfunction makes it highly unlikely that her cardiomyopathy is due to side effects from chemotherapy.  The agents that she is taking are not common causes of cardiomyopathy.  She is fully anticoagulated with Arixtra. I agree that she does not need diuretics at this time, but if she develops tachypnea and hypoxemia, that might be the first intervention necessary.  Other medications for heart failure are limited by her inability to take p.o. medications. I do not think the risk of coronary angiography is worth the additional information it would give [place].  She has severe chronic kidney disease and is just now recovering from acute kidney injury.   I would recommend repeating an echocardiogram in roughly  a week.  If wall motion abnormalities persist and her mental status has improved, we will discuss coronary angiography.  If regional wall motion abnormalities have resolved by then, would assume that the diagnosis of stress cardiomyopathy is correct and would not pursue further diagnostic testing.     Thurmon Fair, MD, [facility] [phone] 12/05/2022, 1:39 PM

## 2022-12-05 NOTE — Plan of Care (Signed)
  Problem: Education: Goal: Knowledge of General Education information will improve Description: Including pain rating scale, medication(s)/side effects and non-pharmacologic comfort measures 12/05/2022 1859 by Val Eagle, RN Outcome: Not Progressing 12/05/2022 1859 by Val Eagle, RN Outcome: Progressing   Problem: Health Behavior/Discharge Planning: Goal: Ability to manage health-related needs will improve 12/05/2022 1859 by Val Eagle, RN Outcome: Not Progressing 12/05/2022 1859 by Val Eagle, RN Outcome: Progressing

## 2022-12-05 NOTE — Progress Notes (Signed)
PROGRESS NOTE    Jaime Gomez  ZOX:[id number] DOB: November 05, 1965 DOA: 11/28/2022 PCP: Staci Acosta, FNP   Brief Narrative:  56 y.o. 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 The patient has had placement of a left sided nephrostomy tube which was due for replacement on 11/29/2022.  Patient admitted for sepsis secondary to urinary tract infection.  IR consulted for nephrostomy tube change.  Patient tolerated the procedure well.  Due to anemia, 2 units PRBC was given with appropriate response.  Hospital course complicated by acute kidney injury which is slowly improving.  Further complicated by multifocal CVA concerns of embolic versus septic embolism infarct.  Neurology, ID following.  Chemo-Port removed on 5/13.   Assessment & Plan:  Principal Problem:   Sepsis secondary to UTI [facility]) Active Problems:   Endometrial cancer, FIGO stage IVB (HCC)   Leg DVT (deep venous thromboembolism), chronic, right (HCC)   Acute renal failure superimposed on stage 3a chronic kidney disease (HCC)   Essential (primary) hypertension   Type 2 diabetes mellitus without complications (HCC)   Abnormality of lung on CXR   Metabolic acidosis with normal anion gap and failure of bicarbonate regeneration   Major depressive disorder, single episode, moderate (HCC)     Assessment and Plan: * Sepsis secondary to UTI (HCC) Pseudomonas bacteremia Sepsis physiology is stable.  Initially nephrostomy tube exchanged by IR on 5/8.  Blood cultures grew Pseudomonas.  Initially oncology and family decided to leave her Chemo-Port and despite risk of bacteremia but eventually it was taken out due to worsening medical condition on 5/13.  Cefepime changed to meropenem due to concerns of cefepime related  neurotoxicity. Infectious diseases following.  Acute/subacute CVA - Concerns of CVA versus septic emboli.  Patient seen by neurology.  MRI shows multifocal infarct, MRA negative for LVO.  Currently heparin drip being held until we are sure that this is not septic emboli due to high risk of hemorrhagic conversion.  LDL 93, A1c 8.3.  Echocardiogram shows depressed EF but no obvious evidence of vegetations. Carotid Dopplers-unremarkable  Metabolic encephalopathy - Combination of underlying infection, uremia, acute CVA, cefepime neurotoxicity.  Renal function is improving.  Cefepime has been discontinued.  Stroke workup and infection management as above.  EEG does not show any active evidence of seizure but does show metabolic/toxic causes with severe diffuse encephalopathy. -Other metabolic workup including TSH, ammonia and B12 are negative -No role for lumbar puncture at this time  Congestive heart failure with reduced ejection fraction, 35% - This was performed as part of stroke workup now which shows depressed EF.  No prior evidence of this.  Cardiology team consulted for further management.  Unfortunately patient has multiple complicated issues going on therefore we will have to defer ischemic workup in my opinion but will defer this to cardiology team.  Acute renal failure superimposed on stage 3a chronic kidney disease (HCC) Metabolic acidosis Slowly improving Baseline creatinine 2.6, admission creatinine 4.9 > 5.15 >> 3.48 Briefly received IV bicarb as unable to take p.o. but bicarb appears to be stable. Renal ultrasound-chronic disease Bladder scan - no retention.   Hypomagnesemia/hypokalemia - As needed repletion  Leg DVT (deep venous thromboembolism), chronic, right [facility]) Per hematology/oncology, previously failed Eliquis therefore was on outpatient Arixtra.  During the hospitalization patient was started on heparin drip as  Arixtra was not deemed to be safe at this level of renal  function.  I suspect given patient's CKD, best option would be Coumadin or Lovenox for.  I have expressed this to oncology multiple times, if they feel strongly about Arixtra then they will have to order exact dosing for it when it safe to resume anticoagulation.  Pharmacy and myself does not know appropriate dosing for Arixtra at this level of renal dysfunction.  Depression - Mirtazapine bedtime.  Seen by psych.    Abnormality of lung on CXR The patient has persistent bilateral perihilar patchy opacities on CXR. They have previously been present on CXR of 08/2021 and PET-CT of 08/21/2022. Monitor. No current symptoms of respiratory illness.  Anemia of Chronic Dz Baseline Hb 9.5, slowly hemoglobin drifted down to 7.1.  Early in the admission received 2 units of PRBC on 5/8, since then hemoglobin has been stable.  Type 2 diabetes mellitus without complications (HCC) Peripheral neuropathy -Sliding scale and Accu-Chek. Gabapentin (renally dosed) Due to poor mentation and appetite she was getting hypoglycemic therefore her long-acting insulin was held especially in the setting of worsening renal function  Essential (primary) hypertension On Coreg which she is not currently taking therefore placed on scheduled labetalol IV as needed  Endometrial cancer, FIGO stage IVB (HCC) On outpatient chemotherapy  DVT prophylaxis: Heparin drip, currently held due to suspicion for septic emboli with high risk of hemorrhagic conversion.  Patient would not allow SCDs Code Status: Full Family Communication: Sister not present at bedside.  Will try to call her later today Status is: Inpatient Multiple ongoing issues.  Continue hospital stay   Diet Orders (From admission, onward)     Start     Ordered   12/04/22 2332  Diet NPO time specified  Diet effective now        12/04/22 2336            Subjective: Seen at bedside.  Patient appears to be resting comfortably.  Patient had just received  Dilaudid Examination: Constitutional: Not in acute distress Respiratory: Clear to auscultation bilaterally Cardiovascular: Normal sinus rhythm, no rubs Abdomen: Nontender nondistended good bowel sounds Musculoskeletal: No edema noted Skin: No rashes seen Neurologic: Unable to assess but appears comfortable Psychiatric: Unable to assess  Chemo-Port in the right chest wall site looks okay Objective: Vitals:   12/04/22 1700 12/04/22 1818 12/04/22 2119 12/05/22 0632  BP: (!) 165/93  (!) 167/109 (!) 150/94  Pulse: 92  89 87  Resp: 20  18 18   Temp: (!) 97.2 F (36.2 C) 98.6 F (37 C)  99.4 F (37.4 C)  TempSrc: Axillary Rectal  Axillary  SpO2: 100%  98% 99%  Weight:      Height:        Intake/Output Summary (Last 24 hours) at 12/05/2022 0751 Last data filed at 12/05/2022 2956 Gross per 24 hour  Intake 192.16 ml  Output 2600 ml  Net -2407.84 ml   Filed Weights   11/28/22 0819  Weight: 60.8 kg    Scheduled Meds:   stroke: early stages of recovery book   Does not apply Once   acetaminophen  1,000 mg Oral Once   carvedilol  12.5 mg Oral BID WC   Chlorhexidine Gluconate Cloth  6 each Topical Daily   folic acid  1 mg Oral Q0600   gabapentin  100 mg Oral BID   insulin aspart  0-9 Units Subcutaneous TID WC   labetalol  5 mg Intravenous Q6H  mirtazapine  7.5 mg Oral QHS   sodium bicarbonate  650 mg Oral TID   Continuous Infusions:  meropenem (MERREM) IV Stopped (12/04/22 2223)    Nutritional status Signs/Symptoms: meal completion < 25% Interventions: Refer to RD note for recommendations Body mass index is 22.3 kg/m.  Data Reviewed:   CBC: Recent Labs  Lab 11/28/22 0844 11/29/22 0403 12/01/22 0059 12/02/22 0416 12/03/22 0255 12/04/22 0233 12/05/22 0637  WBC 4.0   < > 3.7* 5.9 4.7 4.1 5.5  NEUTROABS 3.3  --   --  4.6  --   --   --   HGB 7.3*   < > 9.6* 9.8* 9.8* 9.9* 11.3*  HCT 22.1*   < > 28.6* 30.0* 30.6* 30.9* 34.9*  MCV 81.3   < > 87.5 87.5 90.0 90.6  89.5  PLT 120*   < > 151 195 181 187 208   < > = values in this interval not displayed.   Basic Metabolic Panel: Recent Labs  Lab 12/01/22 0059 12/02/22 0416 12/03/22 0255 12/04/22 0233 12/05/22 0637  NA 129* 137 132* 139 143  K 4.0 3.4* 3.5 3.4* 3.7  CL 102 109 116* 110 113*  CO2 16* 14* 13* 15* 16*  GLUCOSE 256* 59* 98 203* 140*  BUN 78* 80* 71* 73* 64*  CREATININE 4.67* 4.32* 3.85* 3.65* 3.48*  CALCIUM 7.5* 8.0* 7.4* 8.2* 8.8*  MG 2.3 2.1 2.0 1.8 1.8   GFR: Estimated Creatinine Clearance: 15.9 mL/min (A) (by C-G formula based on SCr of 3.48 mg/dL (H)). Liver Function Tests: Recent Labs  Lab 11/28/22 0844 11/29/22 0403  AST 23 76*  ALT 8 11  ALKPHOS 100 82  BILITOT 0.4 0.5  PROT 6.5 6.3*  ALBUMIN 2.4* 2.3*   No results for input(s): "LIPASE", "AMYLASE" in the last 168 hours. Recent Labs  Lab 12/03/22 1335  AMMONIA 17   Coagulation Profile: Recent Labs  Lab 11/28/22 0844 11/30/22 1204  INR 1.2 1.1   Cardiac Enzymes: No results for input(s): "CKTOTAL", "CKMB", "CKMBINDEX", "TROPONINI" in the last 168 hours. BNP (last 3 results) No results for input(s): "PROBNP" in the last 8760 hours. HbA1C: No results for input(s): "HGBA1C" in the last 72 hours.  CBG: Recent Labs  Lab 12/04/22 0734 12/04/22 1113 12/04/22 1644 12/04/22 2106 12/05/22 0723  GLUCAP 155* 135* 125* 128* 131*   Lipid Profile: Recent Labs    12/04/22 0232  CHOL 162  HDL 29*  LDLCALC 93  TRIG 161*  CHOLHDL 5.6   Thyroid Function Tests: Recent Labs    12/03/22 1335  TSH 2.520   Anemia Panel: Recent Labs    12/03/22 1335  VITAMINB12 1,055*  FOLATE 28.4   Sepsis Labs: Recent Labs  Lab 11/28/22 0844 12/03/22 1335  LATICACIDVEN 1.3 0.8    Recent Results (from the past 240 hour(s))  Resp panel by RT-PCR (RSV, Flu A&B, Covid) Anterior Nasal Swab     Status: None   Collection Time: 11/28/22  8:44 AM   Specimen: Anterior Nasal Swab  Result Value Ref Range Status    SARS Coronavirus 2 by RT PCR NEGATIVE NEGATIVE Final    Comment: (NOTE) SARS-CoV-2 target nucleic acids are NOT DETECTED.  The SARS-CoV-2 RNA is generally detectable in upper respiratory specimens during the acute phase of infection. The lowest concentration of SARS-CoV-2 viral copies this assay can detect is 138 copies/mL. A negative result does not preclude SARS-Cov-2 infection and should not be used as the sole basis for treatment or  other patient management decisions. A negative result may occur with  improper specimen collection/handling, submission of specimen other than nasopharyngeal swab, presence of viral mutation(s) within the areas targeted by this assay, and inadequate number of viral copies(<138 copies/mL). A negative result must be combined with clinical observations, patient history, and epidemiological information. The expected result is Negative.  Fact Sheet for Patients:  BloggerCourse.com  Fact Sheet for Healthcare Providers:  SeriousBroker.it  This test is no t yet approved or cleared by the [place] FDA and  has been authorized for detection and/or diagnosis of SARS-CoV-2 by FDA under an Emergency Use Authorization (EUA). This EUA will remain  in effect (meaning this test can be used) for the duration of the COVID-19 declaration under Section 564(b)(1) of the Act, 21 U.S.C.section 360bbb-3(b)(1), unless the authorization is terminated  or revoked sooner.       Influenza A by PCR NEGATIVE NEGATIVE Final   Influenza B by PCR NEGATIVE NEGATIVE Final    Comment: (NOTE) The Xpert Xpress SARS-CoV-2/FLU/RSV plus assay is intended as an aid in the diagnosis of influenza from Nasopharyngeal swab specimens and should not be used as a sole basis for treatment. Nasal washings and aspirates are unacceptable for Xpert Xpress SARS-CoV-2/FLU/RSV testing.  Fact Sheet for  Patients: BloggerCourse.com  Fact Sheet for Healthcare Providers: SeriousBroker.it  This test is not yet approved or cleared by the [place] FDA and has been authorized for detection and/or diagnosis of SARS-CoV-2 by FDA under an Emergency Use Authorization (EUA). This EUA will remain in effect (meaning this test can be used) for the duration of the COVID-19 declaration under Section 564(b)(1) of the Act, 21 U.S.C. section 360bbb-3(b)(1), unless the authorization is terminated or revoked.     Resp Syncytial Virus by PCR NEGATIVE NEGATIVE Final    Comment: (NOTE) Fact Sheet for Patients: BloggerCourse.com  Fact Sheet for Healthcare Providers: SeriousBroker.it  This test is not yet approved or cleared by the [place] FDA and has been authorized for detection and/or diagnosis of SARS-CoV-2 by FDA under an Emergency Use Authorization (EUA). This EUA will remain in effect (meaning this test can be used) for the duration of the COVID-19 declaration under Section 564(b)(1) of the Act, 21 U.S.C. section 360bbb-3(b)(1), unless the authorization is terminated or revoked.  Performed at [facility], [address] [place], [place] [address]   Blood Culture (routine x 2)     Status: Abnormal   Collection Time: 11/28/22  8:44 AM   Specimen: BLOOD  Result Value Ref Range Status   Specimen Description   Final    BLOOD BLOOD RIGHT FOREARM Performed at [facility], [address] [place], [place] [address]    Special Requests   Final    Blood Culture results may not be optimal due to an excessive volume of blood received in culture bottles BOTTLES DRAWN AEROBIC AND ANAEROBIC Performed at [facility], [address] [place], [place] [address]    Culture  Setup Time   Final    GRAM NEGATIVE RODS ANAEROBIC BOTTLE ONLY CRITICAL RESULT CALLED TO,  READ BACK BY AND VERIFIED WITH: L. Loletta Specter PHARMD, AT 1351 11/29/22 Renato Shin Performed at [facility], [address], [place], [place] [address]    Culture PSEUDOMONAS AERUGINOSA (A)  Final   Report Status 12/01/2022 FINAL  Final   Organism ID, Bacteria PSEUDOMONAS AERUGINOSA  Final      Susceptibility  Pseudomonas aeruginosa - MIC*    CEFTAZIDIME <=1 SENSITIVE Sensitive     CIPROFLOXACIN 0.5 SENSITIVE Sensitive     GENTAMICIN <=1 SENSITIVE Sensitive     IMIPENEM <=0.25 SENSITIVE Sensitive     PIP/TAZO <=4 SENSITIVE Sensitive     CEFEPIME 2 SENSITIVE Sensitive     * PSEUDOMONAS AERUGINOSA  Urine Culture     Status: Abnormal   Collection Time: 11/28/22  8:44 AM   Specimen: Urine, Random  Result Value Ref Range Status   Specimen Description   Final    URINE, RANDOM Performed at [facility], [address] [place], [place] [address]    Special Requests   Final    NONE Reflexed from [id number] Performed at [facility], [address] [place], [place] [address]    Culture (A)  Final    >=100,000 COLONIES/mL LACTOBACILLUS SPECIES Standardized susceptibility testing for this organism is not available. Performed at [facility], [address] [place], [place] [address]    Report Status 11/29/2022 FINAL  Final  Blood Culture ID Panel (Reflexed)     Status: Abnormal   Collection Time: 11/28/22  8:44 AM  Result Value Ref Range Status   Enterococcus faecalis NOT DETECTED NOT DETECTED Final   Enterococcus Faecium NOT DETECTED NOT DETECTED Final   Listeria monocytogenes NOT DETECTED NOT DETECTED Final   Staphylococcus species NOT DETECTED NOT DETECTED Final   Staphylococcus aureus (BCID) NOT DETECTED NOT DETECTED Final   Staphylococcus epidermidis NOT DETECTED NOT DETECTED Final   Staphylococcus lugdunensis NOT DETECTED NOT DETECTED Final   Streptococcus species NOT DETECTED NOT DETECTED Final   Streptococcus agalactiae NOT DETECTED NOT DETECTED  Final   Streptococcus pneumoniae NOT DETECTED NOT DETECTED Final   Streptococcus pyogenes NOT DETECTED NOT DETECTED Final   A.calcoaceticus-baumannii NOT DETECTED NOT DETECTED Final   Bacteroides fragilis NOT DETECTED NOT DETECTED Final   Enterobacterales NOT DETECTED NOT DETECTED Final   Enterobacter cloacae complex NOT DETECTED NOT DETECTED Final   Escherichia coli NOT DETECTED NOT DETECTED Final   Klebsiella aerogenes NOT DETECTED NOT DETECTED Final   Klebsiella oxytoca NOT DETECTED NOT DETECTED Final   Klebsiella pneumoniae NOT DETECTED NOT DETECTED Final   Proteus species NOT DETECTED NOT DETECTED Final   Salmonella species NOT DETECTED NOT DETECTED Final   Serratia marcescens NOT DETECTED NOT DETECTED Final   Haemophilus influenzae NOT DETECTED NOT DETECTED Final   Neisseria meningitidis NOT DETECTED NOT DETECTED Final   Pseudomonas aeruginosa DETECTED (A) NOT DETECTED Final    Comment: CRITICAL RESULT CALLED TO, READ BACK BY AND VERIFIED WITH: L. POINDEXTER PHARMD, AT 1351 11/29/22 D. VANHOOK    Stenotrophomonas maltophilia NOT DETECTED NOT DETECTED Final   Candida albicans NOT DETECTED NOT DETECTED Final   Candida auris NOT DETECTED NOT DETECTED Final   Candida glabrata NOT DETECTED NOT DETECTED Final   Candida krusei NOT DETECTED NOT DETECTED Final   Candida parapsilosis NOT DETECTED NOT DETECTED Final   Candida tropicalis NOT DETECTED NOT DETECTED Final   Cryptococcus neoformans/gattii NOT DETECTED NOT DETECTED Final   CTX-M ESBL NOT DETECTED NOT DETECTED Final   Carbapenem resistance IMP NOT DETECTED NOT DETECTED Final   Carbapenem resistance KPC NOT DETECTED NOT DETECTED Final   Carbapenem resistance NDM NOT DETECTED NOT DETECTED Final   Carbapenem resistance VIM NOT DETECTED NOT DETECTED Final    Comment: Performed at [facility], [address] [place], [place] [address]  Blood Culture (routine x 2)     Status: None   Collection Time: 11/28/22  9:02 AM    Specimen: BLOOD  Result Value Ref Range Status   Specimen Description   Final    BLOOD RIGHT ANTECUBITAL Performed at [facility], [address] [place], [place] [address]    Special Requests   Final    Blood Culture adequate volume BOTTLES DRAWN AEROBIC AND ANAEROBIC Performed at [facility], [address] [place], [place] [address]    Culture   Final    NO GROWTH 5 DAYS Performed at [facility], [address] [place], [place] [address]    Report Status 12/03/2022 FINAL  Final  Culture, blood (Routine X 2) w Reflex to ID Panel     Status: None   Collection Time: 11/30/22  6:25 PM   Specimen: BLOOD RIGHT ARM  Result Value Ref Range Status   Specimen Description   Final    BLOOD RIGHT ARM Performed at [facility], [address], [place], [place] [address]    Special Requests   Final    BOTTLES DRAWN AEROBIC ONLY Blood Culture adequate volume Performed at [facility], [address] [place], [place] [address]    Culture   Final    NO GROWTH 5 DAYS Performed at [facility], [address] [place], [place] [address]    Report Status 12/05/2022 FINAL  Final  Culture, blood (Routine X 2) w Reflex to ID Panel     Status: None   Collection Time: 11/30/22  6:30 PM   Specimen: BLOOD LEFT HAND  Result Value Ref Range Status   Specimen Description   Final    BLOOD LEFT HAND Performed at [facility], [address] [place], [place] [address]    Special Requests   Final    BOTTLES DRAWN AEROBIC ONLY Blood Culture adequate volume Performed at [facility], [address] [place], [place] [address]    Culture   Final    NO GROWTH 5 DAYS Performed at [facility], [address] [place], [place] [address]    Report Status 12/05/2022 FINAL  Final         Radiology Studies: IR REMOVAL TUN ACCESS W/ PORT W/O FL MOD SED  Result Date: 12/04/2022 CLINICAL DATA:  Sepsis.   Pseudomonas bacteremia. EXAM: REMOVAL OF IMPLANTED TUNNELED PORT-A-CATH MEDICATIONS: 10 mL lidocaine 1%. ANESTHESIA/SEDATION: Local anesthetic was administered. FLUOROSCOPY TIME:  None PROCEDURE: Informed written consent was obtained from the patient and/or patient's representative after a discussion of the risk, benefits and alternatives to the procedure. The patient was positioned supine on the fluoroscopy table and the RIGHT chest Port-A-Cath site was prepped with chlorhexidine. A sterile gown and gloves were worn during the procedure. Local anesthesia was provided with 1% lidocaine with epinephrine. A timeout was performed prior to the initiation of the procedure. An incision was made overlying the Port-A-Cath with a #15 scalpel. Utilizing sharp and blunt dissection, the Port-A-Cath was removed completely. The pocked was irrigated with sterile saline. Wound closure was performed with interrupted subcutaneous 2-0 Vicryl sutures then Dermabond was applied at the skin. Dressings were applied. The patient tolerated the procedure well without immediate post procedural complication. FINDINGS: Removal of implant Port-A-Cath without immediate post procedural complication. IMPRESSION: Successful removal of an implanted RIGHT chest Port-A-Cath. Roanna Banning, MD [facility]  [facility] Electronically Signed   By: Roanna Banning M.D.   On: 12/04/2022 16:53   EEG adult  Result Date: 12/04/2022 Charlsie Quest, MD     12/04/2022  4:50 PM Patient Name: Jaime Gomez MRN: [id number] Epilepsy Attending: Charlsie Quest Referring Physician/Provider: Dimple Nanas, MD Date: 12/04/2022 Duration: 22.40 mins Patient history: a 57 y/o person living with a history of adenocarcinoma of the uterus followed by oncology, s/p left nephrostomy tube secondary to scarring from chemotherapeutics, chronic DVT of right lower extremity who presented with pseudomonas bacteremia and sepsis, now  with worsening encephalopathy and found to have acute/subacute CVA's on imaging. EEG to evaluate for seizure Level of alertness:  lethargic AEDs during EEG study: GBP Technical aspects: This EEG study was done with scalp electrodes positioned according to the 10-20 International system of electrode placement. Electrical activity was reviewed with band pass filter of 1-70Hz , sensitivity of 7 uV/mm, display speed of 24mm/sec with a 60Hz  notched filter applied as appropriate. EEG data were recorded continuously and digitally stored.  Video monitoring was available and reviewed as appropriate. Description: EEG showed continuous generalized 3 to 5 Hz theta-delta slowing. Generalized periodic discharges with triphasic morphology at 1-1.5 Hz were also noted. Hyperventilation and photic stimulation were not performed.   ABNORMALITY - Periodic discharges with triphasic morphology, generalized ( GPDs) - Continuous slow, generalized IMPRESSION: This study showed generalized periodic discharges with triphasic morphology which can be on the ictal-interictal continuum. However, the morphology, frequency is more likely suggestive of toxic-metabolic causes. Additionally there is moderate to severe diffuse encephalopathy. No seizures were seen throughout the recording. Priyanka O Yadav   VAS US CAROTID  Result Date: 12/04/2022 Carotid Arterial Duplex Study Patient Name:  Jaime Gomez  Date of Exam:   12/04/2022 Medical Rec #: [id number]     Accession #:    [id number] Date of Birth: [birth year]     Patient Gender: F Patient Age:   24 years Exam Location:  [facility] Procedure:      VAS US CAROTID Referring Phys: Stephania Fragmin --------------------------------------------------------------------------------  Indications:       CVA. Risk Factors:      Hypertension, Diabetes, CKD, no history of smoking. Limitations        Today's exam was limited due to patient unable to follow                    commands / position properly.  Comparison Study:  No previous exams Performing Technologist: Jody Hill RVT, RDMS  Examination Guidelines: A complete evaluation includes B-mode imaging, spectral Doppler, color Doppler, and power Doppler as needed of all accessible portions of each vessel. Bilateral testing is considered an integral part of a complete examination. Limited examinations for reoccurring indications may be performed as noted.  Right Carotid Findings: +----------+--------+--------+--------+------------------+------------------+           PSV cm/sEDV cm/sStenosisPlaque DescriptionComments           +----------+--------+--------+--------+------------------+------------------+ CCA Prox  46      11                                intimal thickening +----------+--------+--------+--------+------------------+------------------+ CCA Distal51      16                                                   +----------+--------+--------+--------+------------------+------------------+  ICA Prox  50      19                                                   +----------+--------+--------+--------+------------------+------------------+ ICA Distal55      14                                                   +----------+--------+--------+--------+------------------+------------------+ ECA       55      6                                                    +----------+--------+--------+--------+------------------+------------------+ +----------+--------+-------+----------------+-------------------+           PSV cm/sEDV cmsDescribe        Arm Pressure (mmHG) +----------+--------+-------+----------------+-------------------+ [id number]             Multiphasic, WNL                    +----------+--------+-------+----------------+-------------------+ +---------+--------+--+--------+-+--------------+ VertebralPSV cm/s24EDV cm/s3High resistant +---------+--------+--+--------+-+--------------+  Left Carotid  Findings: +----------+--------+--------+--------+------------------+------------------+           PSV cm/sEDV cm/sStenosisPlaque DescriptionComments           +----------+--------+--------+--------+------------------+------------------+ CCA Prox  56      8                                                    +----------+--------+--------+--------+------------------+------------------+ CCA Distal56      8                                 intimal thickening +----------+--------+--------+--------+------------------+------------------+ ICA Prox  54      16                                                   +----------+--------+--------+--------+------------------+------------------+ ICA Distal64      17                                                   +----------+--------+--------+--------+------------------+------------------+ ECA       60      6                                                    +----------+--------+--------+--------+------------------+------------------+ +----------+--------+--------+----------------+-------------------+           PSV cm/sEDV cm/sDescribe        Arm Pressure (mmHG) +----------+--------+--------+----------------+-------------------+ [id number]  Multiphasic, WNL                    +----------+--------+--------+----------------+-------------------+ +---------+--------+--+--------+-+---------+ VertebralPSV cm/s62EDV cm/s7Antegrade +---------+--------+--+--------+-+---------+   Summary: Right Carotid: The extracranial vessels were near-normal with only minimal wall                thickening or plaque. Left Carotid: The extracranial vessels were near-normal with only minimal wall               thickening or plaque. Vertebrals:  Left vertebral artery demonstrates antegrade flow. Right vertebral              artery demonstrates high resistant flow. Subclavians: Normal flow hemodynamics were seen in bilateral subclavian               arteries. *See table(s) above for measurements and observations.     Preliminary    ECHOCARDIOGRAM COMPLETE  Result Date: 12/04/2022    ECHOCARDIOGRAM REPORT   Patient Name:   Jaime Gomez Date of Exam: 12/04/2022 Medical Rec #:  [id number]    Height:       65.0 in Accession #:    [id number]   Weight:       134.0 lb Date of Birth:  1966/06/14    BSA:          1.669 m Patient Age:    56 years     BP:           145/78 mmHg Patient Gender: F            HR:           86 bpm. Exam Location:  Inpatient Procedure: 2D Echo, Color Doppler and Cardiac Doppler Indications:    Bacteremia  History:        Patient has no prior history of Echocardiogram examinations.                 Risk Factors:Hypertension and Diabetes. Endometrial Cancer, DVT,                 CKD, Sepsis.  Sonographer:    Milbert Coulter Referring Phys: [id number] [facility] IMPRESSIONS  1. Global hypokinesis with apical akinesis; overall moderate LV dysfunction.  2. Left ventricular ejection fraction, by estimation, is 30 to 35%. The left ventricle has moderately decreased function. The left ventricle demonstrates regional wall motion abnormalities (see scoring diagram/findings for description). The left ventricular internal cavity size was mildly dilated. Left ventricular diastolic parameters are consistent with Grade I diastolic dysfunction (impaired relaxation).  3. Right ventricular systolic function is normal. The right ventricular size is normal.  4. The mitral valve is normal in structure. Trivial mitral valve regurgitation. No evidence of mitral stenosis.  5. The aortic valve is tricuspid. Aortic valve regurgitation is not visualized. No aortic stenosis is present.  6. The inferior vena cava is normal in size with greater than 50% respiratory variability, suggesting right atrial pressure of 3 mmHg. Comparison(s): No prior Echocardiogram. FINDINGS  Left Ventricle: Left ventricular ejection fraction, by estimation, is 30 to 35%. The left  ventricle has moderately decreased function. The left ventricle demonstrates regional wall motion abnormalities. The left ventricular internal cavity size was mildly dilated. There is no left ventricular hypertrophy. Left ventricular diastolic parameters are consistent with Grade I diastolic dysfunction (impaired relaxation). Right Ventricle: The right ventricular size is normal. Right ventricular systolic function is normal. Left Atrium: Left atrial size was normal in size. Right Atrium: Right atrial size was normal  in size. Pericardium: Trivial pericardial effusion is present. Mitral Valve: The mitral valve is normal in structure. Trivial mitral valve regurgitation. No evidence of mitral valve stenosis. Tricuspid Valve: The tricuspid valve is normal in structure. Tricuspid valve regurgitation is not demonstrated. No evidence of tricuspid stenosis. Aortic Valve: The aortic valve is tricuspid. Aortic valve regurgitation is not visualized. No aortic stenosis is present. Aortic valve mean gradient measures 3.0 mmHg. Aortic valve peak gradient measures 6.0 mmHg. Aortic valve area, by VTI measures 2.74 cm. Pulmonic Valve: The pulmonic valve was normal in structure. Pulmonic valve regurgitation is not visualized. No evidence of pulmonic stenosis. Aorta: The aortic root is normal in size and structure. Venous: The inferior vena cava is normal in size with greater than 50% respiratory variability, suggesting right atrial pressure of 3 mmHg. IAS/Shunts: No atrial level shunt detected by color flow Doppler. Additional Comments: Global hypokinesis with apical akinesis; overall moderate LV dysfunction.  LEFT VENTRICLE PLAX 2D LVIDd:         5.40 cm     Diastology LVIDs:         3.80 cm     LV e' medial:    4.79 cm/s LV PW:         0.90 cm     LV E/e' medial:  13.2 LV IVS:        0.90 cm     LV e' lateral:   3.59 cm/s LVOT diam:     2.10 cm     LV E/e' lateral: 17.7 LV SV:         59 LV SV Index:   35 LVOT Area:     3.46 cm   LV Volumes (MOD) LV vol d, MOD A2C: 80.0 ml LV vol d, MOD A4C: 78.0 ml LV vol s, MOD A2C: 64.6 ml LV vol s, MOD A4C: 44.5 ml LV SV MOD A2C:     15.4 ml LV SV MOD A4C:     78.0 ml LV SV MOD BP:      27.8 ml RIGHT VENTRICLE RV Basal diam:  2.60 cm RV Mid diam:    2.50 cm RV S prime:     10.60 cm/s TAPSE (M-mode): 1.9 cm LEFT ATRIUM             Index        RIGHT ATRIUM          Index LA diam:        3.20 cm 1.92 cm/m   RA Area:     5.91 cm LA Vol (A2C):   35.7 ml 21.40 ml/m  RA Volume:   7.95 ml  4.76 ml/m LA Vol (A4C):   40.6 ml 24.33 ml/m LA Biplane Vol: 38.7 ml 23.19 ml/m  AORTIC VALVE AV Area (Vmax):    2.90 cm AV Area (Vmean):   2.65 cm AV Area (VTI):     2.74 cm AV Vmax:           122.00 cm/s AV Vmean:          80.100 cm/s AV VTI:            0.215 m AV Peak Grad:      6.0 mmHg AV Mean Grad:      3.0 mmHg LVOT Vmax:         102.00 cm/s LVOT Vmean:        61.300 cm/s LVOT VTI:          0.170 m LVOT/AV VTI  ratio: 0.79  AORTA Ao Root diam: 3.20 cm Ao Asc diam:  3.40 cm MITRAL VALVE MV Area (PHT): 3.77 cm    SHUNTS MV Decel Time: 201 msec    Systemic VTI:  0.17 m MV E velocity: 63.40 cm/s  Systemic Diam: 2.10 cm MV A velocity: 85.30 cm/s MV E/A ratio:  0.74 Olga Millers MD Electronically signed by Olga Millers MD Signature Date/Time: 12/04/2022/4:08:12 PM    Final    MR ANGIO HEAD WO CONTRAST  Result Date: 12/04/2022 CLINICAL DATA:  Stroke/TIA, determine embolic source. EXAM: MRA HEAD WITHOUT CONTRAST TECHNIQUE: Angiographic images of the Circle of Willis were acquired using MRA technique without intravenous contrast. COMPARISON:  Head MRI 12/04/2022 FINDINGS: The study is motion degraded, particularly at the level of the circle of Willis and more superiorly. Anterior circulation: The internal carotid arteries are patent from skull base to carotid termini with suboptimal assessment of the supraclinoid segments but without evidence of a significant stenosis more proximally. ACAs and MCAs are grossly  patent, however motion artifact precludes detailed assessment of the A1, M1, M2, and proximal A2 segments. No large aneurysm is identified. Posterior circulation: The included intracranial portion of the left vertebral artery is widely patent to the basilar and strongly dominant. The distal right vertebral artery is small and not well evaluated, likely congenitally hypoplastic. Patent bilateral PICA, left AICA, and bilateral SCA origins are visualized. The basilar artery is widely patent. The right PCA is patent without evidence a flow limiting proximal stenosis. The left PCA is poorly visualized. Comparing with today's earlier head MRI, there is likely a fetal origin of the left PCA. Faint signal is present in the left posterior communicating artery and proximal left P2 segment with assessment limited by artifact. No aneurysm is identified. Anatomic variants: Suspected fetal left PCA. IMPRESSION: Motion degraded examination, particularly severe at the level of the circle of Willis. No gross large vessel occlusion in the anterior circulation. Poor evaluation of the left PCA. Electronically Signed   By: Sebastian Ache M.D.   On: 12/04/2022 16:00   MR BRAIN WO CONTRAST  Result Date: 12/04/2022 CLINICAL DATA:  Mental status change, unknown cause EXAM: MRI HEAD WITHOUT CONTRAST TECHNIQUE: Multiplanar, multiecho pulse sequences of the brain and surrounding structures were obtained without intravenous contrast. COMPARISON:  CT head Nov 30, 2022. FINDINGS: Brain: Multiple areas of restricted diffusion and T2/FLAIR hyperintensity in the left frontoparietal white matter. Additional punctate acute infarcts in the left cerebellum and left occipital lobe. Punctate acute infarct in the right occipital lobe. No evidence of acute hemorrhage, mass effect or midline shift. Trace amount of prior hemorrhage associated with the left frontal parietal infarcts. No hydrocephalus. Vascular: Major arterial flow voids are maintained at the  skull base. Skull and upper cervical spine: Normal marrow signal. Sinuses/Orbits: Left maxillary sinus mucosal thickening. No acute orbital findings. IMPRESSION: 1. 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 Given involvement of multiple vascular territories, consider an embolic etiology. 2. No significant mass effect. Electronically Signed   By: Feliberto Harts M.D.   On: 12/04/2022 10:29   DG Chest Port 1 View  Result Date: 12/03/2022 CLINICAL DATA:  Dyspnea.  Fever. EXAM: PORTABLE CHEST 1 VIEW COMPARISON:  Nov 28, 2022 FINDINGS: Stable right Port-A-Cath in good position. No pneumothorax. The right lung is clear. The cardiomediastinal silhouette is stable. New hazy opacity in the left perihilar region. IMPRESSION: New hazy opacity in the  left perihilar region could represent developing pneumonia. Recommend short-term follow-up imaging to ensure resolution. Electronically Signed   By: Gerome Sam III M.D.   On: 12/03/2022 10:26           LOS: 7 days   Time spent= 35 mins    Dayshon Roback Joline Maxcy, MD [facility]  If 7PM-7AM, please contact night-coverage  12/05/2022, 7:51 AM

## 2022-12-05 NOTE — Progress Notes (Signed)
It is hard to tell if Jaime Gomez is making progress.  She did open her eyes when I called her name this morning.  It is still unclear as to what exactly is going on.  She had a echocardiogram yesterday.  Surprising, she has a low ejection fraction of 30-35%.  All the valves looked fine.  All cultures are negative.  She had the Port-A-Cath taken out.  I know that she is being followed closely by neurology.  I think she had an EEG yesterday.  I am not sure of the interpretation.  Hopefully, she will not need a lumbar puncture.  She did have an MRA of the brain.  Again this really was not that revealing.  Her labs show sodium 143.  Potassium 3.7.  BUN 64 creatinine 3.48.  Calcium 8.8.  Her white cell count is 5.5.  Hemoglobin 7.3.  Platelet count is 208,000.   Her vital signs show temperature of 99.4.  Pulse 87.  Blood pressure 150/94.  Her blood pressure has always been on the higher side.  I thought maybe this could be some form of PRES, but I would think that the MRI would have shown this.  Overall, the really is no change in her physical exam.  Her lungs sound clear bilaterally.  She has good air movement bilaterally.  Cardiac exam regular rate and rhythm.  I cannot detect a murmur.  Abdomen is soft.  Bowel sounds are present.  There is no fluid wave.  There is no palpable liver or spleen tip.  Extremities shows some mild edema.  Hopefully, Jaime Gomez neurological status will improve.  I know that everybody is working hard on her.  The 1 issue that I do have is that she is going to have to be fed.  She, unfortunate, is probably going to need to have a feeding tube put in.  She really needs to have nutrition.  She also possibly need to have some kind of PICC line placed for better IV access.  I do appreciate everybody's help on 4 W.  I know everybody is working really hard.  Jaime Bach, MD  Romans 8:38

## 2022-12-05 NOTE — TOC Progression Note (Addendum)
Transition of Care [facility]) - Progression Note    Patient Details  Name: Jaime Gomez MRN: [id number] Date of Birth: [birth year]  Transition of [facility]) CM/SW Contact  Howell Rucks, RN Phone Number: 12/05/2022, 12:17 PM  Clinical Narrative:  Met with pt's sister [name]) at bedside, presented with list of short term rehab bed offers, [name] to review for bed choice. Will continue to follow.   - 4:39pm Call received from [facility] representative, reports he made onsite review of pt, based on pt's current medical status inquiring it pt still requiring PASRR, informed yes as discharge plan remains short term rehab, he voiced understanding.     Expected Discharge Plan: Home/Self Care Barriers to Discharge: Continued Medical Work up  Expected Discharge Plan and Services   Discharge Planning Services: CM Consult                                           Social Determinants of Health (SDOH) Interventions SDOH Screenings   Food Insecurity: No Food Insecurity (11/28/2022)  Housing: Low Risk  (11/28/2022)  Transportation Needs: No Transportation Needs (11/28/2022)  Utilities: Not At Risk (11/28/2022)  Depression (PHQ2-9): Low Risk  (09/18/2022)  Financial Resource Strain: Low Risk  (06/27/2022)  Stress: No Stress Concern Present (06/27/2022)  Tobacco Use: Low Risk  (12/04/2022)    Readmission Risk Interventions    11/30/2022   10:29 AM  Readmission Risk Prevention Plan  Transportation Screening Complete  PCP or Specialist Appt within 3-5 Days Complete  HRI or Home Care Consult Complete  Social Work Consult for Recovery Care Planning/Counseling Complete  Palliative Care Screening Not Applicable  Medication Review Oceanographer) Complete

## 2022-12-05 NOTE — Plan of Care (Signed)
EEG as would be expected for cefepime toxicity. Expect to gradually clear, please reach out if patient not improving or other questions / concerns arise   Regarding TEE, from MRI brain appearance I highly doubt stroke mechanism is septic emboli (septic emboli almost always have at least petechial hemorrhage seen on SWI sequences). However, defer to cardiology / infectious disease / primary team on weighing overall risk / benefit of this procedure given her comorbidities and acute infection without clear source. From a delirium perspective, anesthesia tends to worsen delirium. From a stroke perspective, no contraindication to performing TEE; the largest stroke is subacute in appearance and the others are quite punctate.   Neurology will be available as needed, discussed with primary team, cardiology and ID via secure chat.   Brooke Dare MD-PhD [facility] [phone]  Available 7 AM to 7 PM, outside these hours please contact Neurologist on call listed on AMION

## 2022-12-06 DIAGNOSIS — I10 Essential (primary) hypertension: Secondary | ICD-10-CM

## 2022-12-06 DIAGNOSIS — R6520 Severe sepsis without septic shock: Secondary | ICD-10-CM

## 2022-12-06 DIAGNOSIS — N179 Acute kidney failure, unspecified: Secondary | ICD-10-CM

## 2022-12-06 DIAGNOSIS — G9341 Metabolic encephalopathy: Secondary | ICD-10-CM | POA: Diagnosis not present

## 2022-12-06 DIAGNOSIS — I509 Heart failure, unspecified: Secondary | ICD-10-CM | POA: Diagnosis not present

## 2022-12-06 DIAGNOSIS — A419 Sepsis, unspecified organism: Secondary | ICD-10-CM | POA: Diagnosis not present

## 2022-12-06 DIAGNOSIS — I63423 Cerebral infarction due to embolism of bilateral anterior cerebral arteries: Secondary | ICD-10-CM

## 2022-12-06 DIAGNOSIS — G9340 Encephalopathy, unspecified: Secondary | ICD-10-CM | POA: Diagnosis not present

## 2022-12-06 DIAGNOSIS — N390 Urinary tract infection, site not specified: Secondary | ICD-10-CM | POA: Diagnosis not present

## 2022-12-06 LAB — BASIC METABOLIC PANEL
Anion gap: 13 (ref 5–15)
BUN: 63 mg/dL — ABNORMAL HIGH (ref 6–20)
CO2: 20 mmol/L — ABNORMAL LOW (ref 22–32)
Calcium: 9 mg/dL (ref 8.9–10.3)
Chloride: 113 mmol/L — ABNORMAL HIGH (ref 98–111)
Creatinine, Ser: 3.19 mg/dL — ABNORMAL HIGH (ref 0.44–1.00)
GFR, Estimated: 16 mL/min — ABNORMAL LOW (ref >60)
Glucose, Bld: 113 mg/dL — ABNORMAL HIGH (ref 70–99)
Potassium: 3.5 mmol/L (ref 3.5–5.1)
Sodium: 146 mmol/L — ABNORMAL HIGH (ref 135–145)

## 2022-12-06 LAB — CBC
HCT: 34.3 % — ABNORMAL LOW (ref 36.0–46.0)
Hemoglobin: 10.8 g/dL — ABNORMAL LOW (ref 12.0–15.0)
MCH: 28.6 pg (ref 26.0–34.0)
MCHC: 31.5 g/dL (ref 30.0–36.0)
MCV: 91 fL (ref 80.0–100.0)
Platelets: 186 10*3/uL (ref 150–400)
RBC: 3.77 MIL/uL — ABNORMAL LOW (ref 3.87–5.11)
RDW: 16.4 % — ABNORMAL HIGH (ref 11.5–15.5)
WBC: 4.7 10*3/uL (ref 4.0–10.5)
nRBC: 0 % (ref 0.0–0.2)

## 2022-12-06 LAB — GLUCOSE, CAPILLARY
Glucose-Capillary: 168 mg/dL — ABNORMAL HIGH (ref 70–99)
Glucose-Capillary: 176 mg/dL — ABNORMAL HIGH (ref 70–99)
Glucose-Capillary: 185 mg/dL — ABNORMAL HIGH (ref 70–99)
Glucose-Capillary: 134 mg/dL — ABNORMAL HIGH (ref 70–99)

## 2022-12-06 LAB — MAGNESIUM: Magnesium: 1.8 mg/dL (ref 1.7–2.4)

## 2022-12-06 MED ORDER — SODIUM CHLORIDE 0.9 % IV SOLN: INTRAVENOUS | Status: AC

## 2022-12-06 MED ORDER — LABETALOL HCL 5 MG/ML IV SOLN
5.0000 mg | INTRAVENOUS | Status: DC | PRN
  Administered 2022-12-06: 5 mg via INTRAVENOUS

## 2022-12-06 MED ORDER — HALOPERIDOL LACTATE 5 MG/ML IJ SOLN
1.0000 mg | Freq: Four times a day (QID) | INTRAMUSCULAR | Status: DC | PRN
  Administered 2022-12-06: 1 mg via INTRAVENOUS
  Filled 2022-12-06: qty 1

## 2022-12-06 MED ORDER — ENSURE ENLIVE PO LIQD
237.0000 mL | Freq: Three times a day (TID) | ORAL | Status: DC
  Administered 2022-12-06 – 2022-12-07 (×2): 237 mL via ORAL

## 2022-12-06 NOTE — Progress Notes (Signed)
Physical Therapy Treatment Patient Details Name: Tanis Brecker MRN: [id number] DOB: [birth year] Today's Date: 12/06/2022   History of Present Illness Patient is a 57 year old female who presented on 5/7 with left side nephrostomy tube that was due to be replaced on 5/8. Patient was admitted with sepsis,and UTI. Patient was noted to have nephrostomy tube changed with anemia post op with 2 units of PRBCs. 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 Port a cath has been removed.  PMH:  endometrial cancer , CKD , chronic iron deficiency anemia, Hypertension, DM II, RLE DVT.    PT Comments    Pt seen this om with assist from nursing for gait progression/mobility. Pt awake and sitting upright in bed, appearing like she wants to get up out of bed. Pt assisted to EOB with A of 2 and completed sit to stand with ModA of 2 due to pt not following/comprehending vc's. Pt completed gait training with Nursing providing HHA in front of pt and therapist providing CGA for safety. Pt tolerated 460ft with slow cadence, no LOB or buckling. Pt remained mostly non-verbal during session. Positioned back to bed with nursing and pt's sister in room.   Recommendations for follow up therapy are one component of a multi-disciplinary discharge planning process, led by the attending physician.  Recommendations may be updated based on patient status, additional functional criteria and insurance authorization.  Follow Up Recommendations       Assistance Recommended at Discharge Frequent or constant Supervision/Assistance  Patient can return home with the following Two people to help with walking and/or transfers;Two people to help with bathing/dressing/bathroom;Assistance with cooking/housework;Assist for transportation;Help with stairs or ramp for  entrance;Assistance with feeding;Direct supervision/assist for medications management;Direct supervision/assist for financial management   Equipment Recommendations  Rolling walker (2 wheels) (TBD)    Recommendations for Other Services       Precautions / Restrictions Precautions Precautions: Fall Precaution Comments: Lt neph tube Restrictions Weight Bearing Restrictions: No     Mobility  Bed Mobility Overal bed mobility: Needs Assistance Bed Mobility: Supine to Sit, Sit to Supine     Supine to sit: Mod assist Sit to supine: Mod assist   General bed mobility comments:  (Pt can get in and out of bed without physical assist when willing.)    Transfers Overall transfer level: Needs assistance Equipment used: 2 person hand held assist Transfers: Sit to/from Stand Sit to Stand: Mod assist, +2 physical assistance           General transfer comment:  (Pt is able to stand without physical assist when willing/understands desired task)    Ambulation/Gait Ambulation/Gait assistance: Min assist, +2 physical assistance (Nursing walking infront  holding pt's hands and walking backwards, PT providing close CGA in case of sudden buckling.) Gait Distance (Feet): 400 Feet Assistive device: 1 person hand held assist Gait Pattern/deviations: Step-to pattern, Narrow base of support, Drifts right/left Gait velocity: decreased     General Gait Details:  (No buckling noted, pt was non-verbal during gait training, and did not experience LOB.)   Stairs             Wheelchair Mobility    Modified Rankin (Stroke Patients Only)       Balance Overall balance assessment: Needs assistance Sitting-balance support: No upper extremity supported, Feet supported Sitting balance-Leahy Scale:  Fair Sitting balance - Comments: static fair once EOB   Standing balance support: Bilateral upper extremity supported, During functional activity Standing balance-Leahy Scale: Fair Standing  balance comment: Standing balance varies depending on fatigue level and cognitive status                            Cognition Arousal/Alertness: Awake/alert Behavior During Therapy: Flat affect Overall Cognitive Status: Impaired/Different from baseline Area of Impairment: Following commands, Safety/judgement, Awareness                       Following Commands: Follows one step commands inconsistently Safety/Judgement: Decreased awareness of safety, Decreased awareness of deficits     General Comments:  (Pt only verbally responded to therapist one time during session. Remained mute throughout gait training, but cooperative)        Exercises      General Comments General comments (skin integrity, edema, etc.): Good tolerance for activity, sister present for mobility. Pt continues to moan at times, with occassional appropriate single word responses to questions.      Pertinent Vitals/Pain Pain Assessment Pain Assessment: No/denies pain    Home Living                          Prior Function            PT Goals (current goals can now be found in the care plan section)      Frequency    Min 1X/week      PT Plan      Co-evaluation              AM-PAC PT "6 Clicks" Mobility   Outcome Measure  Help needed turning from your back to your side while in a flat bed without using bedrails?: A Little Help needed moving from lying on your back to sitting on the side of a flat bed without using bedrails?: A Lot Help needed moving to and from a bed to a chair (including a wheelchair)?: A Lot Help needed standing up from a chair using your arms (e.g., wheelchair or bedside chair)?: A Lot Help needed to walk in hospital room?: A Lot Help needed climbing 3-5 steps with a railing? : A Lot 6 Click Score: 13    End of Session Equipment Utilized During Treatment: Gait belt Activity Tolerance: Patient tolerated treatment well Patient left: in  bed;with call bell/phone within reach;with nursing/sitter in room Nurse Communication: Mobility status PT Visit Diagnosis: Other abnormalities of gait and mobility (R26.89)     Time: 1430-1450 PT Time Calculation (min) (ACUTE ONLY): 20 min  Charges:  $Gait Training: 8-22 mins                    Zadie Cleverly, PTA  Jannet Askew 12/06/2022, 4:48 PM

## 2022-12-06 NOTE — Plan of Care (Signed)
?  Problem: Education: ?Goal: Knowledge of General Education information will improve ?Description: Including pain rating scale, medication(s)/side effects and non-pharmacologic comfort measures ?Outcome: Not Progressing ?  ?Problem: Health Behavior/Discharge Planning: ?Goal: Ability to manage health-related needs will improve ?Outcome: Not Progressing ?  ?Problem: Clinical Measurements: ?Goal: Ability to maintain clinical measurements within normal limits will improve ?Outcome: Not Progressing ?  ?Problem: Activity: ?Goal: Risk for activity intolerance will decrease ?Outcome: Not Progressing ?  ?Problem: Nutrition: ?Goal: Adequate nutrition will be maintained ?Outcome: Not Progressing ?  ?

## 2022-12-06 NOTE — Plan of Care (Signed)
  Problem: Education: Goal: Knowledge of General Education information will improve Description: Including pain rating scale, medication(s)/side effects and non-pharmacologic comfort measures 12/06/2022 0813 by Val Eagle, RN Outcome: Not Progressing 12/05/2022 1859 by Val Eagle, RN Outcome: Not Progressing 12/05/2022 1859 by Val Eagle, RN Outcome: Progressing   Problem: Health Behavior/Discharge Planning: Goal: Ability to manage health-related needs will improve 12/06/2022 0813 by Val Eagle, RN Outcome: Not Progressing 12/05/2022 1859 by Val Eagle, RN Outcome: Not Progressing 12/05/2022 1859 by Val Eagle, RN Outcome: Progressing

## 2022-12-06 NOTE — Plan of Care (Signed)
  Problem: Health Behavior/Discharge Planning: Goal: Ability to manage health-related needs will improve Outcome: Progressing   Problem: Clinical Measurements: Goal: Ability to maintain clinical measurements within normal limits will improve Outcome: Progressing   Problem: Activity: Goal: Risk for activity intolerance will decrease Outcome: Progressing   Problem: Nutrition: Goal: Adequate nutrition will be maintained Outcome: Progressing   Problem: Pain Managment: Goal: General experience of comfort will improve Outcome: Progressing   Problem: Safety: Goal: Ability to remain free from injury will improve Outcome: Progressing   

## 2022-12-06 NOTE — Plan of Care (Signed)
  Problem: Education: Goal: Knowledge of General Education information will improve Description: Including pain rating scale, medication(s)/side effects and non-pharmacologic comfort measures 12/06/2022 1911 by Val Eagle, RN Outcome: Progressing 12/06/2022 0813 by Val Eagle, RN Outcome: Not Progressing   Problem: Health Behavior/Discharge Planning: Goal: Ability to manage health-related needs will improve 12/06/2022 1911 by Val Eagle, RN Outcome: Progressing 12/06/2022 0813 by Val Eagle, RN Outcome: Not Progressing

## 2022-12-06 NOTE — TOC Progression Note (Signed)
Transition of Care [facility]) - Progression Note    Patient Details  Name: Elease Legan MRN: [id number] Date of Birth: 11-12-1965  Transition of Care [facility] [place]) CM/SW Contact  Howell Rucks, RN Phone Number: 12/06/2022, 8:44 AM  Clinical Narrative:   Call received from Russell with [facility], reports Level 2 PASRR will be canceled based on evaluation of pt's current medical status/mental alertness. NCM will follow and re-request PAS[facility] as needed.    Expected Discharge Plan: Home/Self Care Barriers to Discharge: Continued Medical Work up  Expected Discharge Plan and Services   Discharge Planning Services: CM Consult                                           Social Determinants of Health (SDOH) Interventions SDOH Screenings   Food Insecurity: No Food Insecurity (11/28/2022)  Housing: Low Risk  (11/28/2022)  Transportation Needs: No Transportation Needs (11/28/2022)  Utilities: Not At Risk (11/28/2022)  Depression (PHQ2-9): Low Risk  (09/18/2022)  Financial Resource Strain: Low Risk  (06/27/2022)  Stress: No Stress Concern Present (06/27/2022)  Tobacco Use: Low Risk  (12/04/2022)    Readmission Risk Interventions    11/30/2022   10:29 AM  Readmission Risk Prevention Plan  Transportation Screening Complete  PCP or Specialist Appt within 3-5 Days Complete  HRI or Home Care Consult Complete  Social Work Consult for Recovery Care Planning/Counseling Complete  Palliative Care Screening Not Applicable  Medication Review Oceanographer) Complete

## 2022-12-06 NOTE — Progress Notes (Signed)
[facility] PROGRESS NOTE    Progress Note  Jaime Gomez  ZOX:[id number] DOB: 28-Sep-1965 DOA: 11/28/2022 PCP: Staci Acosta, FNP     Brief Narrative:   Jaime Gomez is an 57 y.o. female past medical history significant for endometrial cancer on oral chemotherapy, chronic kidney disease stage III, iron deficiency anemia, essential hypertension diabetes mellitus type 2 history of right lower extremity DVT while on Arixtra, will with a left-sided nephrostomy tube exchange on 11/29/2022 patient admitted for sepsis due to UTI IR consulted nephrostomy tube was placed.  She is status post 2 units of packed red blood cells Hospital course was complicated by acute kidney injury which is slowly improving and further complicated by multifocal CVA concern for embolic versus septic, neurology infectious disease were consulted.  Port has been removed on 12/04/2022   Assessment/Plan:   Sepsis secondary to UTI (HCC)/Pseudomonas bacteremia: Status post percutaneous tube exchange on 11/29/2022. Blood cultures positive for Pseudomonas. Port-A-Cath removed on 12/04/2022. Currently on IV meropenem, cefepime was discontinued due to neurotoxicity's. Infectious disease has been consulted.  We are concerned of possible embolic infarcts. Transesophageal echo did not show vegetation 12/04/2022. ID recommended a TEE to exclude vegetation if possible. Surveillance blood cultures on 11/30/2022 have been negative till date.  Newly diagnosed HFrEF: Cardiology was consulted and is concerned about stress cardiomyopathy/Takotsubo syndrome. Likely due to Pseudomonas sepsis. Cardiology recommended no diuretics at this time as she appears euvolemic. Repeat echocardiogram in 1 to 2 weeks to evaluate wall motion abnormality and EF. Once the patient is stable to take oral she can be started on Coreg no ARB no ACE inhibitor no SGLT2 inhibitor due to renal dysfunction. Ischemic workup Has been deferred for now  Acute/subacute  CVA/acute encephalopathy: MRI of the brain showed multiple infarcts. Consider right CVA versus septic emboli. Neurology was consulted.  MRI showed no large vessel occlusions. Carotid Dopplers unremarkable. EEG showed possibly cefepime toxicity.  Acute metabolic encephalopathy: Multifactorial likely due to infectious etiology uremia CVA and cefepime toxicity. Neurology has been discontinued EEG was done that showed possible severe diffuse encephalopathy likely due to cefepime. TSH and B12 are unremarkable. Not a great candidate for lumbar puncture, her mentation is improving unlikely to be helpful.  Acute kidney injury superimposed on chronic kidney disease stage IIIa: Baseline creatinine of 2.6 on admission 4.9. Treated conservatively with IV fluids slowly improving. Renal ultrasound showed chronic renal disease. Creatinine this morning is 3.1. As she is n.p.o. and encephalopathic with no oral intake started on gentle IV fluids.  Hypomagnesemia/hypokalemia: Repeat check the potassium greater than 4 magnesium greater than 2.  Chronic right lower extremity DVT: Previously failed Eliquis as an outpatient now on Arixtra. Due to her renal dysfunction Arixtra was discontinued. Would recommend Coumadin and Lovenox. Previous physician expressed oncology that due to her renal dysfunction Arixtra is not recommended. Pharmacy does not know the appropriate dosing for Arixtra at this level of renal dysfunction. Oncology to recommend dose in time of initiation of Arixtra.  And when it is safe to resume anticoagulation. Further anticoagulation per Neurology. IV heparin held due to suspicious for septic emboli with high risk of hemorrhagic transformation of his CVA  Depression: With sertraline at bedtime seen by psych.  Abnormal x-ray of the lung: Currently asymptomatic seen on previous x-ray continue to monitor.  Anemia of chronic disease: Response to units of packed red blood cells,  hemoglobin this morning is 10.8. No signs of overt bleeding.  Diabetes mellitus type 2 with peripheral neuropathy: Due  to poor oral intake hypoglycemic agents were held, especially in the setting of worsening renal dysfunction.  Essential hypertension: Labetalol IV as needed not able to take orals. When she is more awake and able to take orals can resume Coreg.  Endometrial cancer stage IV: To resume outpatient chemotherapy   DVT prophylaxis: none Family Communication:none Status is: Inpatient Remains inpatient appropriate because: Acute CVA Pseudomonas bacteremia    Code Status:     Code Status Orders  (From admission, onward)           Start     Ordered   11/28/22 1537  Full code  Continuous       Question:  By:  Answer:  Consent: discussion documented in EHR   11/28/22 1547           Code Status History     Date Active Date Inactive Code Status Order ID Comments User Context   09/01/2021 2140 09/05/2021 1404 Full Code [id number]  Synetta Fail, MD Inpatient         IV Access:   Peripheral IV   Procedures and diagnostic studies:   IR REMOVAL TUN ACCESS W/ PORT W/O FL MOD SED  Result Date: 12/04/2022 CLINICAL DATA:  Sepsis.  Pseudomonas bacteremia. EXAM: REMOVAL OF IMPLANTED TUNNELED PORT-A-CATH MEDICATIONS: 10 mL lidocaine 1%. ANESTHESIA/SEDATION: Local anesthetic was administered. FLUOROSCOPY TIME:  None PROCEDURE: Informed written consent was obtained from the patient and/or patient's representative after a discussion of the risk, benefits and alternatives to the procedure. The patient was positioned supine on the fluoroscopy table and the RIGHT chest Port-A-Cath site was prepped with chlorhexidine. A sterile gown and gloves were worn during the procedure. Local anesthesia was provided with 1% lidocaine with epinephrine. A timeout was performed prior to the initiation of the procedure. An incision was made overlying the Port-A-Cath with a #15 scalpel.  Utilizing sharp and blunt dissection, the Port-A-Cath was removed completely. The pocked was irrigated with sterile saline. Wound closure was performed with interrupted subcutaneous 2-0 Vicryl sutures then Dermabond was applied at the skin. Dressings were applied. The patient tolerated the procedure well without immediate post procedural complication. FINDINGS: Removal of implant Port-A-Cath without immediate post procedural complication. IMPRESSION: Successful removal of an implanted RIGHT chest Port-A-Cath. Roanna Banning, MD [facility] Electronically Signed   By: Roanna Banning M.D.   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 EEG to evaluate for seizure Level of alertness:  lethargic AEDs during EEG study: GBP Technical aspects: This EEG study was done with scalp electrodes positioned according to the 10-20 International system of electrode placement. Electrical activity was reviewed with band pass filter of 1-70Hz , sensitivity of 7 uV/mm, display speed of 35mm/sec with a 60Hz  notched filter applied as appropriate. EEG data were recorded continuously and digitally stored.  Video monitoring was available and reviewed as appropriate. Description: EEG showed continuous generalized 3 to 5 Hz theta-delta slowing. Generalized periodic discharges with triphasic  morphology at 1-1.5 Hz  were also noted. Hyperventilation and photic stimulation were not performed.   ABNORMALITY - Periodic discharges with triphasic morphology, generalized ( GPDs) - Continuous slow, generalized IMPRESSION: This study showed generalized periodic discharges with triphasic morphology which can be on the ictal-interictal continuum. However, the morphology, frequency is more likely suggestive of toxic-metabolic causes. Additionally there is moderate to severe diffuse encephalopathy. No seizures were seen throughout the recording. Priyanka O Yadav   VAS US CAROTID  Result Date: 12/04/2022 Carotid Arterial Duplex Study Patient Name:  Jaime Gomez  Date of Exam:   12/04/2022 Medical Rec #: [id number]     Accession #:    [id number] Date of Birth: 1966/04/25     Patient Gender: F Patient Age:   51 years Exam Location:  [facility] Procedure:      VAS US CAROTID Referring Phys: Stephania Fragmin --------------------------------------------------------------------------------  Indications:       CVA. Risk Factors:      Hypertension, Diabetes, CKD, no history of smoking. Limitations        Today's exam was limited due to patient unable to follow                    commands / position properly. Comparison Study:  No previous exams Performing Technologist: Jody Hill RVT, RDMS  Examination Guidelines: A complete evaluation includes B-mode imaging, spectral Doppler, color Doppler, and power Doppler as needed of all accessible portions of each vessel. Bilateral testing is considered an integral part of a complete examination. Limited examinations for reoccurring indications may be performed as noted.  Right Carotid Findings: +----------+--------+--------+--------+------------------+------------------+           PSV cm/sEDV cm/sStenosisPlaque DescriptionComments           +----------+--------+--------+--------+------------------+------------------+ CCA Prox  46      11                                intimal thickening  +----------+--------+--------+--------+------------------+------------------+ CCA Distal51      16                                                   +----------+--------+--------+--------+------------------+------------------+ ICA Prox  50      19                                                   +----------+--------+--------+--------+------------------+------------------+ ICA Distal55      14                                                   +----------+--------+--------+--------+------------------+------------------+ ECA       55      6                                                    +----------+--------+--------+--------+------------------+------------------+ +----------+--------+-------+----------------+-------------------+  PSV cm/sEDV cmsDescribe        Arm Pressure (mmHG) +----------+--------+-------+----------------+-------------------+ [id number]             Multiphasic, WNL                    +----------+--------+-------+----------------+-------------------+ +---------+--------+--+--------+-+--------------+ VertebralPSV cm/s24EDV cm/s3High resistant +---------+--------+--+--------+-+--------------+  Left Carotid Findings: +----------+--------+--------+--------+------------------+------------------+           PSV cm/sEDV cm/sStenosisPlaque DescriptionComments           +----------+--------+--------+--------+------------------+------------------+ CCA Prox  56      8                                                    +----------+--------+--------+--------+------------------+------------------+ CCA Distal56      8                                 intimal thickening +----------+--------+--------+--------+------------------+------------------+ ICA Prox  54      16                                                   +----------+--------+--------+--------+------------------+------------------+ ICA Distal64      17                                                    +----------+--------+--------+--------+------------------+------------------+ ECA       60      6                                                    +----------+--------+--------+--------+------------------+------------------+ +----------+--------+--------+----------------+-------------------+           PSV cm/sEDV cm/sDescribe        Arm Pressure (mmHG) +----------+--------+--------+----------------+-------------------+ [id number]              Multiphasic, WNL                    +----------+--------+--------+----------------+-------------------+ +---------+--------+--+--------+-+---------+ VertebralPSV cm/s62EDV cm/s7Antegrade +---------+--------+--+--------+-+---------+   Summary: Right Carotid: The extracranial vessels were near-normal with only minimal wall                thickening or plaque. Left Carotid: The extracranial vessels were near-normal with only minimal wall               thickening or plaque. Vertebrals:  Left vertebral artery demonstrates antegrade flow. Right vertebral              artery demonstrates high resistant flow. Subclavians: Normal flow hemodynamics were seen in bilateral subclavian              arteries. *See table(s) above for measurements and observations.     Preliminary    ECHOCARDIOGRAM COMPLETE  Result Date: 12/04/2022    ECHOCARDIOGRAM REPORT   Patient Name:   Jaime Gomez Date of Exam: 12/04/2022 Medical Rec #:  [id number]  Height:       65.0 in Accession #:    [id number]   Weight:       134.0 lb Date of Birth:  1966/05/31    BSA:          1.669 m Patient Age:    56 years     BP:           145/78 mmHg Patient Gender: F            HR:           86 bpm. Exam Location:  Inpatient Procedure: 2D Echo, Color Doppler and Cardiac Doppler Indications:    Bacteremia  History:        Patient has no prior history of Echocardiogram examinations.                 Risk Factors:Hypertension and Diabetes. Endometrial  Cancer, DVT,                 CKD, Sepsis.  Sonographer:    Milbert Coulter Referring Phys: [id number] [facility] IMPRESSIONS  1. Global hypokinesis with apical akinesis; overall moderate LV dysfunction.  2. Left ventricular ejection fraction, by estimation, is 30 to 35%. The left ventricle has moderately decreased function. The left ventricle demonstrates regional wall motion abnormalities (see scoring diagram/findings for description). The left ventricular internal cavity size was mildly dilated. Left ventricular diastolic parameters are consistent with Grade I diastolic dysfunction (impaired relaxation).  3. Right ventricular systolic function is normal. The right ventricular size is normal.  4. The mitral valve is normal in structure. Trivial mitral valve regurgitation. No evidence of mitral stenosis.  5. The aortic valve is tricuspid. Aortic valve regurgitation is not visualized. No aortic stenosis is present.  6. The inferior vena cava is normal in size with greater than 50% respiratory variability, suggesting right atrial pressure of 3 mmHg. Comparison(s): No prior Echocardiogram. FINDINGS  Left Ventricle: Left ventricular ejection fraction, by estimation, is 30 to 35%. The left ventricle has moderately decreased function. The left ventricle demonstrates regional wall motion abnormalities. The left ventricular internal cavity size was mildly dilated. There is no left ventricular hypertrophy. Left ventricular diastolic parameters are consistent with Grade I diastolic dysfunction (impaired relaxation). Right Ventricle: The right ventricular size is normal. Right ventricular systolic function is normal. Left Atrium: Left atrial size was normal in size. Right Atrium: Right atrial size was normal in size. Pericardium: Trivial pericardial effusion is present. Mitral Valve: The mitral valve is normal in structure. Trivial mitral valve regurgitation. No evidence of mitral valve stenosis. Tricuspid Valve: The tricuspid  valve is normal in structure. Tricuspid valve regurgitation is not demonstrated. No evidence of tricuspid stenosis. Aortic Valve: The aortic valve is tricuspid. Aortic valve regurgitation is not visualized. No aortic stenosis is present. Aortic valve mean gradient measures 3.0 mmHg. Aortic valve peak gradient measures 6.0 mmHg. Aortic valve area, by VTI measures 2.74 cm. Pulmonic Valve: The pulmonic valve was normal in structure. Pulmonic valve regurgitation is not visualized. No evidence of pulmonic stenosis. Aorta: The aortic root is normal in size and structure. Venous: The inferior vena cava is normal in size with greater than 50% respiratory variability, suggesting right atrial pressure of 3 mmHg. IAS/Shunts: No atrial level shunt detected by color flow Doppler. Additional Comments: Global hypokinesis with apical akinesis; overall moderate LV dysfunction.  LEFT VENTRICLE PLAX 2D LVIDd:         5.40 cm     Diastology LVIDs:  3.80 cm     LV e' medial:    4.79 cm/s LV PW:         0.90 cm     LV E/e' medial:  13.2 LV IVS:        0.90 cm     LV e' lateral:   3.59 cm/s LVOT diam:     2.10 cm     LV E/e' lateral: 17.7 LV SV:         59 LV SV Index:   35 LVOT Area:     3.46 cm  LV Volumes (MOD) LV vol d, MOD A2C: 80.0 ml LV vol d, MOD A4C: 78.0 ml LV vol s, MOD A2C: 64.6 ml LV vol s, MOD A4C: 44.5 ml LV SV MOD A2C:     15.4 ml LV SV MOD A4C:     78.0 ml LV SV MOD BP:      27.8 ml RIGHT VENTRICLE RV Basal diam:  2.60 cm RV Mid diam:    2.50 cm RV S prime:     10.60 cm/s TAPSE (M-mode): 1.9 cm LEFT ATRIUM             Index        RIGHT ATRIUM          Index LA diam:        3.20 cm 1.92 cm/m   RA Area:     5.91 cm LA Vol (A2C):   35.7 ml 21.40 ml/m  RA Volume:   7.95 ml  4.76 ml/m LA Vol (A4C):   40.6 ml 24.33 ml/m LA Biplane Vol: 38.7 ml 23.19 ml/m  AORTIC VALVE AV Area (Vmax):    2.90 cm AV Area (Vmean):   2.65 cm AV Area (VTI):     2.74 cm AV Vmax:           122.00 cm/s AV Vmean:          80.100 cm/s  AV VTI:            0.215 m AV Peak Grad:      6.0 mmHg AV Mean Grad:      3.0 mmHg LVOT Vmax:         102.00 cm/s LVOT Vmean:        61.300 cm/s LVOT VTI:          0.170 m LVOT/AV VTI ratio: 0.79  AORTA Ao Root diam: 3.20 cm Ao Asc diam:  3.40 cm MITRAL VALVE MV Area (PHT): 3.77 cm    SHUNTS MV Decel Time: 201 msec    Systemic VTI:  0.17 m MV E velocity: 63.40 cm/s  Systemic Diam: 2.10 cm MV A velocity: 85.30 cm/s MV E/A ratio:  0.74 Olga Millers MD Electronically signed by Olga Millers MD Signature Date/Time: 12/04/2022/4:08:12 PM    Final    MR ANGIO HEAD WO CONTRAST  Result Date: 12/04/2022 CLINICAL DATA:  Stroke/TIA, determine embolic source. EXAM: MRA HEAD WITHOUT CONTRAST TECHNIQUE: Angiographic images of the Circle of Willis were acquired using MRA technique without intravenous contrast. COMPARISON:  Head MRI 12/04/2022 FINDINGS: The study is motion degraded, particularly at the level of the circle of Willis and more superiorly. Anterior circulation: The internal carotid arteries are patent from skull base to carotid termini with suboptimal assessment of the supraclinoid segments but without evidence of a significant stenosis more proximally. ACAs and MCAs are grossly patent, however motion artifact precludes detailed assessment of the A1, M1, M2, and proximal A2 segments.  No large aneurysm is identified. Posterior circulation: The included intracranial portion of the left vertebral artery is widely patent to the basilar and strongly dominant. The distal right vertebral artery is small and not well evaluated, likely congenitally hypoplastic. Patent bilateral PICA, left AICA, and bilateral SCA origins are visualized. The basilar artery is widely patent. The right PCA is patent without evidence a flow limiting proximal stenosis. The left PCA is poorly visualized. Comparing with today's earlier head MRI, there is likely a fetal origin of the left PCA. Faint signal is present in the left posterior  communicating artery and proximal left P2 segment with assessment limited by artifact. No aneurysm is identified. Anatomic variants: Suspected fetal left PCA. IMPRESSION: Motion degraded examination, particularly severe at the level of the circle of Willis. No gross large vessel occlusion in the anterior circulation. Poor evaluation of the left PCA. Electronically Signed   By: Sebastian Ache M.D.   On: 12/04/2022 16:00     Medical Consultants:   None.   Subjective:    Jaime Gomez no complaints  Objective:    Vitals:   12/05/22 2025 12/06/22 0013 12/06/22 0532 12/06/22 0606  BP: (!) 168/91 (!) 165/93 (!) 178/108 (!) 162/105  Pulse: 96 100 (!) 108 97  Resp: 14  14   Temp: (!) 97.5 F (36.4 C)  (!) 97.5 F (36.4 C)   TempSrc: Axillary  Axillary   SpO2: 97% 98% 99% 97%  Weight:      Height:       SpO2: 97 %   Intake/Output Summary (Last 24 hours) at 12/06/2022 0829 Last data filed at 12/06/2022 0734 Gross per 24 hour  Intake 100 ml  Output 2150 ml  Net -2050 ml   Filed Weights   11/28/22 0819  Weight: 60.8 kg    Exam: General exam: In no acute distress. Respiratory system: Good air movement and clear to auscultation. Cardiovascular system: S1 & S2 heard, RRR. No JVD. Gastrointestinal system: Abdomen is nondistended, soft and nontender.  Extremities: No pedal edema. Skin: No rashes, lesions or ulcers Psychiatry: Still sleepy but awake able to follow simple commands, no judgment or insight of medical condition   Data Reviewed:    Labs: Basic Metabolic Panel: Recent Labs  Lab 12/02/22 0416 12/03/22 0255 12/04/22 0233 12/05/22 0637 12/06/22 0359  NA 137 132* 139 143 146*  K 3.4* 3.5 3.4* 3.7 3.5  CL 109 116* 110 113* 113*  CO2 14* 13* 15* 16* 20*  GLUCOSE 59* 98 203* 140* 113*  BUN 80* 71* 73* 64* 63*  CREATININE 4.32* 3.85* 3.65* 3.48* 3.19*  CALCIUM 8.0* 7.4* 8.2* 8.8* 9.0  MG 2.1 2.0 1.8 1.8 1.8   GFR Estimated Creatinine Clearance: 17.3 mL/min (A)  (by C-G formula based on SCr of 3.19 mg/dL (H)). Liver Function Tests: No results for input(s): "AST", "ALT", "ALKPHOS", "BILITOT", "PROT", "ALBUMIN" in the last 168 hours. No results for input(s): "LIPASE", "AMYLASE" in the last 168 hours. Recent Labs  Lab 12/03/22 1335  AMMONIA 17   Coagulation profile Recent Labs  Lab 11/30/22 1204  INR 1.1   COVID-19 Labs  No results for input(s): "DDIMER", "FERRITIN", "LDH", "CRP" in the last 72 hours.  Lab Results  Component Value Date   SARSCOV2NAA NEGATIVE 11/28/2022   SARSCOV2NAA NEGATIVE 09/01/2021    CBC: Recent Labs  Lab 12/02/22 0416 12/03/22 0255 12/04/22 0233 12/05/22 0637 12/06/22 0359  WBC 5.9 4.7 4.1 5.5 4.7  NEUTROABS 4.6  --   --   --   --  HGB 9.8* 9.8* 9.9* 11.3* 10.8*  HCT 30.0* 30.6* 30.9* 34.9* 34.3*  MCV 87.5 90.0 90.6 89.5 91.0  PLT 195 181 187 208 186   Cardiac Enzymes: No results for input(s): "CKTOTAL", "CKMB", "CKMBINDEX", "TROPONINI" in the last 168 hours. BNP (last 3 results) No results for input(s): "PROBNP" in the last 8760 hours. CBG: Recent Labs  Lab 12/05/22 0723 12/05/22 1119 12/05/22 1720 12/05/22 2018 12/06/22 0713  GLUCAP 131* 130* 127* 129* 168*   D-Dimer: No results for input(s): "DDIMER" in the last 72 hours. Hgb A1c: No results for input(s): "HGBA1C" in the last 72 hours. Lipid Profile: Recent Labs    12/04/22 0232  CHOL 162  HDL 29*  LDLCALC 93  TRIG 914*  CHOLHDL 5.6   Thyroid function studies: Recent Labs    12/03/22 1335  TSH 2.520   Anemia work up: Recent Labs    12/03/22 1335  VITAMINB12 1,055*  FOLATE 28.4   Sepsis Labs: Recent Labs  Lab 12/03/22 0255 12/03/22 1335 12/04/22 0233 12/05/22 0637 12/06/22 0359  WBC 4.7  --  4.1 5.5 4.7  LATICACIDVEN  --  0.8  --   --   --    Microbiology Recent Results (from the past 240 hour(s))  Resp panel by RT-PCR (RSV, Flu A&B, Covid) Anterior Nasal Swab     Status: None   Collection Time: 11/28/22   8:44 AM   Specimen: Anterior Nasal Swab  Result Value Ref Range Status   SARS Coronavirus 2 by RT PCR NEGATIVE NEGATIVE Final    Comment: (NOTE) SARS-CoV-2 target nucleic acids are NOT DETECTED.  The SARS-CoV-2 RNA is generally detectable in upper respiratory specimens during the acute phase of infection. The lowest concentration of SARS-CoV-2 viral copies this assay can detect is 138 copies/mL. A negative result does not preclude SARS-Cov-2 infection and should not be used as the sole basis for treatment or other patient management decisions. A negative result may occur with  improper specimen collection/handling, submission of specimen other than nasopharyngeal swab, presence of viral mutation(s) within the areas targeted by this assay, and inadequate number of viral copies(<138 copies/mL). A negative result must be combined with clinical observations, patient history, and epidemiological information. The expected result is Negative.  Fact Sheet for Patients:  BloggerCourse.com  Fact Sheet for Healthcare Providers:  SeriousBroker.it  This test is no t yet approved or cleared by the [place] FDA and  has been authorized for detection and/or diagnosis of SARS-CoV-2 by FDA under an Emergency Use Authorization (EUA). This EUA will remain  in effect (meaning this test can be used) for the duration of the COVID-19 declaration under Section 564(b)(1) of the Act, 21 U.S.C.section 360bbb-3(b)(1), unless the authorization is terminated  or revoked sooner.       Influenza A by PCR NEGATIVE NEGATIVE Final   Influenza B by PCR NEGATIVE NEGATIVE Final    Comment: (NOTE) The Xpert Xpress SARS-CoV-2/FLU/RSV plus assay is intended as an aid in the diagnosis of influenza from Nasopharyngeal swab specimens and should not be used as a sole basis for treatment. Nasal washings and aspirates are unacceptable for Xpert Xpress  SARS-CoV-2/FLU/RSV testing.  Fact Sheet for Patients: BloggerCourse.com  Fact Sheet for Healthcare Providers: SeriousBroker.it  This test is not yet approved or cleared by the [place] FDA and has been authorized for detection and/or diagnosis of SARS-CoV-2 by FDA under an Emergency Use Authorization (EUA). This EUA will remain in effect (meaning this test can be used) for  the duration of the COVID-19 declaration under Section 564(b)(1) of the Act, 21 U.S.C. section 360bbb-3(b)(1), unless the authorization is terminated or revoked.     Resp Syncytial Virus by PCR NEGATIVE NEGATIVE Final    Comment: (NOTE) Fact Sheet for Patients: BloggerCourse.com  Fact Sheet for Healthcare Providers: SeriousBroker.it  This test is not yet approved or cleared by the [place] FDA and has been authorized for detection and/or diagnosis of SARS-CoV-2 by FDA under an Emergency Use Authorization (EUA). This EUA will remain in effect (meaning this test can be used) for the duration of the COVID-19 declaration under Section 564(b)(1) of the Act, 21 U.S.C. section 360bbb-3(b)(1), unless the authorization is terminated or revoked.  Performed at [facility], [address] [place], [place] [address]   Blood Culture (routine x 2)     Status: Abnormal   Collection Time: 11/28/22  8:44 AM   Specimen: BLOOD  Result Value Ref Range Status   Specimen Description   Final    BLOOD BLOOD RIGHT FOREARM Performed at [facility], [address] [place], [place] [address]    Special Requests   Final    Blood Culture results may not be optimal due to an excessive volume of blood received in culture bottles BOTTLES DRAWN AEROBIC AND ANAEROBIC Performed at [facility], [address] [place], [place] [address]    Culture  Setup Time   Final    GRAM NEGATIVE  RODS ANAEROBIC BOTTLE ONLY CRITICAL RESULT CALLED TO, READ BACK BY AND VERIFIED WITH: L. Loletta Specter PHARMD, AT 1351 11/29/22 Renato Shin Performed at [facility], [address], [place], [place] [address]    Culture PSEUDOMONAS AERUGINOSA (A)  Final   Report Status 12/01/2022 FINAL  Final   Organism ID, Bacteria PSEUDOMONAS AERUGINOSA  Final      Susceptibility   Pseudomonas aeruginosa - MIC*    CEFTAZIDIME <=1 SENSITIVE Sensitive     CIPROFLOXACIN 0.5 SENSITIVE Sensitive     GENTAMICIN <=1 SENSITIVE Sensitive     IMIPENEM <=0.25 SENSITIVE Sensitive     PIP/TAZO <=4 SENSITIVE Sensitive     CEFEPIME 2 SENSITIVE Sensitive     * PSEUDOMONAS AERUGINOSA  Urine Culture     Status: Abnormal   Collection Time: 11/28/22  8:44 AM   Specimen: Urine, Random  Result Value Ref Range Status   Specimen Description   Final    URINE, RANDOM Performed at [facility], [address] [place], [place] [address]    Special Requests   Final    NONE Reflexed from [phone] Performed at [facility], [address] [place], [place] [address]    Culture (A)  Final    >=100,000 COLONIES/mL LACTOBACILLUS SPECIES Standardized susceptibility testing for this organism is not available. Performed at [facility], [address] [place], [place] [address]    Report Status 11/29/2022 FINAL  Final  Blood Culture ID Panel (Reflexed)     Status: Abnormal   Collection Time: 11/28/22  8:44 AM  Result Value Ref Range Status   Enterococcus faecalis NOT DETECTED NOT DETECTED Final   Enterococcus Faecium NOT DETECTED NOT DETECTED Final   Listeria monocytogenes NOT DETECTED NOT DETECTED Final   Staphylococcus species NOT DETECTED NOT DETECTED Final   Staphylococcus aureus (BCID) NOT DETECTED NOT DETECTED Final   Staphylococcus epidermidis NOT DETECTED NOT DETECTED Final   Staphylococcus lugdunensis NOT DETECTED  NOT DETECTED Final   Streptococcus species NOT DETECTED NOT DETECTED  Final   Streptococcus agalactiae NOT DETECTED NOT DETECTED Final   Streptococcus pneumoniae NOT DETECTED NOT DETECTED Final   Streptococcus pyogenes NOT DETECTED NOT DETECTED Final   A.calcoaceticus-baumannii NOT DETECTED NOT DETECTED Final   Bacteroides fragilis NOT DETECTED NOT DETECTED Final   Enterobacterales NOT DETECTED NOT DETECTED Final   Enterobacter cloacae complex NOT DETECTED NOT DETECTED Final   Escherichia coli NOT DETECTED NOT DETECTED Final   Klebsiella aerogenes NOT DETECTED NOT DETECTED Final   Klebsiella oxytoca NOT DETECTED NOT DETECTED Final   Klebsiella pneumoniae NOT DETECTED NOT DETECTED Final   Proteus species NOT DETECTED NOT DETECTED Final   Salmonella species NOT DETECTED NOT DETECTED Final   Serratia marcescens NOT DETECTED NOT DETECTED Final   Haemophilus influenzae NOT DETECTED NOT DETECTED Final   Neisseria meningitidis NOT DETECTED NOT DETECTED Final   Pseudomonas aeruginosa DETECTED (A) NOT DETECTED Final    Comment: CRITICAL RESULT CALLED TO, READ BACK BY AND VERIFIED WITH: L. POINDEXTER PHARMD, AT 1351 11/29/22 D. VANHOOK    Stenotrophomonas maltophilia NOT DETECTED NOT DETECTED Final   Candida albicans NOT DETECTED NOT DETECTED Final   Candida auris NOT DETECTED NOT DETECTED Final   Candida glabrata NOT DETECTED NOT DETECTED Final   Candida krusei NOT DETECTED NOT DETECTED Final   Candida parapsilosis NOT DETECTED NOT DETECTED Final   Candida tropicalis NOT DETECTED NOT DETECTED Final   Cryptococcus neoformans/gattii NOT DETECTED NOT DETECTED Final   CTX-M ESBL NOT DETECTED NOT DETECTED Final   Carbapenem resistance IMP NOT DETECTED NOT DETECTED Final   Carbapenem resistance KPC NOT DETECTED NOT DETECTED Final   Carbapenem resistance NDM NOT DETECTED NOT DETECTED Final   Carbapenem resistance VIM NOT DETECTED NOT DETECTED Final    Comment: Performed at [facility], [address] [place], [place] [address]  Blood Culture (routine x 2)      Status: None   Collection Time: 11/28/22  9:02 AM   Specimen: BLOOD  Result Value Ref Range Status   Specimen Description   Final    BLOOD RIGHT ANTECUBITAL Performed at [facility], [address] [place], [place] [address]    Special Requests   Final    Blood Culture adequate volume BOTTLES DRAWN AEROBIC AND ANAEROBIC Performed at [facility], [address], [place], [place] [address]    Culture   Final    NO GROWTH 5 DAYS Performed at [facility], [address], [place], [place] [address]    Report Status 12/03/2022 FINAL  Final  Culture, blood (Routine X 2) w Reflex to ID Panel     Status: None   Collection Time: 11/30/22  6:25 PM   Specimen: BLOOD RIGHT ARM  Result Value Ref Range Status   Specimen Description   Final    BLOOD RIGHT ARM Performed at [facility], [address] [place], [place] [address]    Special Requests   Final    BOTTLES DRAWN AEROBIC ONLY Blood Culture adequate volume Performed at [facility], [address] [place], [place] [address]    Culture   Final    NO GROWTH 5 DAYS Performed at [facility], [address], [place], [place] [address]    Report Status 12/05/2022 FINAL  Final  Culture, blood (Routine X 2) w Reflex to ID Panel  Status: None   Collection Time: 11/30/22  6:30 PM   Specimen: BLOOD LEFT HAND  Result Value Ref Range Status   Specimen Description   Final    BLOOD LEFT HAND Performed at [facility], [address], [place], [place] [address]    Special Requests   Final    BOTTLES DRAWN AEROBIC ONLY Blood Culture adequate volume Performed at [facility], [address] [place], [place] [address]    Culture   Final    NO GROWTH 5 DAYS Performed at [facility], [address] [place], [place] [address]    Report Status 12/05/2022 FINAL  Final     Medications:    acetaminophen  1,000 mg Oral Once   carvedilol  12.5  mg Oral BID WC   Chlorhexidine Gluconate Cloth  6 each Topical Daily   folic acid  1 mg Oral Q0600   gabapentin  100 mg Oral BID   insulin aspart  0-9 Units Subcutaneous TID WC   labetalol  5 mg Intravenous Q6H   mirtazapine  7.5 mg Oral QHS   sodium bicarbonate  650 mg Oral TID   Continuous Infusions:  meropenem (MERREM) IV 1 g (12/05/22 2123)      LOS: 8 days   Marinda Elk  [facility]  12/06/2022, 8:29 AM

## 2022-12-06 NOTE — Progress Notes (Signed)
Occupational Therapy Treatment Patient Details Name: Jaime Gomez MRN: [id number] DOB: 07/18/1966 Today's Date: 12/06/2022   History of present illness Patient is a 57 year old female who presented on 5/7 with left side nephrostomy tube that was due to be replaced on 5/8. Patient was admitted with sepsis,and UTI. Patient was noted to have nephrostomy tube changed with anemia post op with 2 units of PRBCs. 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 Port a cath has been removed.  PMH:  endometrial cancer , CKD , chronic iron deficiency anemia, Hypertension, DM II, RLE DVT.   OT comments  Patient was able to participate in sit to stand with patient initiating pushing up from edge of bed to stand with mod A for about 30 seconds. Patient unable to coordinate second attempt. Patient did have one instance of therapist stating patients name and patient responding "Yes". Patient continues to have limited communication, decreased coordination, decreased safety awareness, decreased functional activity tolerance impacting participation in ADLs. Patient's discharge plan remains appropriate at this time. OT will continue to follow acutely.     Recommendations for follow up therapy are one component of a multi-disciplinary discharge planning process, led by the attending physician.  Recommendations may be updated based on patient status, additional functional criteria and insurance authorization.    Assistance Recommended at Discharge Frequent or constant Supervision/Assistance  Patient can return home with the following  Two people to help with walking and/or transfers;A lot of help with bathing/dressing/bathroom;Assistance with cooking/housework;Direct supervision/assist for medications management;Assist for transportation;Direct  supervision/assist for financial management;Help with stairs or ramp for entrance   Equipment Recommendations  None recommended by OT       Precautions / Restrictions Precautions Precautions: Fall Precaution Comments: Lt neph tube Restrictions Weight Bearing Restrictions: No       Mobility Bed Mobility Overal bed mobility: Needs Assistance Bed Mobility: Supine to Sit, Sit to Supine     Supine to sit: Max assist Sit to supine: Total assist   General bed mobility comments: patient attempted to help with movement to EOB. patient too fatigued to assist with return to bed.             ADL either performed or assessed with clinical judgement   ADL Overall ADL's : Needs assistance/impaired   Eating/Feeding Details (indicate cue type and reason): patient attempted to take sip with straw and allowed to be placed in mouth with Hand over hand. patient unable to pull with straw to get water into mouth and pushed it away. patient provided with cup with no lid to be brought to mouth. patient pushed therapist hand away with cup in it. Grooming: Sitting;Total assistance Grooming Details (indicate cue type and reason): patient took wash cloth from therapist hand but brough it to her lap.                   Toilet Transfer Details (indicate cue type and reason): patient was mod A for sit to stand with patient pushing up from bed and able to stand for about 30 seconds with cues to straighten up. patient sat down and was unable to sequence to get back up again. was able to sit EOB for majoirty of time in room                Extremity/Trunk Assessment  Upper Extremity Assessment Upper Extremity Assessment: Difficult to assess due to impaired cognition (patient was able to reach up and scratch head and move BUE increased difficutly with follow commands intrinsic motivation)   Lower Extremity Assessment Lower Extremity Assessment: Defer to PT evaluation   Cervical / Trunk  Assessment Cervical / Trunk Assessment: Normal     Cognition Arousal/Alertness: Lethargic Behavior During Therapy: Flat affect Overall Cognitive Status: Impaired/Different from baseline Area of Impairment: Following commands           General Comments: patient had one instance of stating " oh my god" and "yes" when name was called sitting EOB.                   Pertinent Vitals/ Pain       Pain Assessment Pain Assessment: Faces Faces Pain Scale: Hurts a little bit Pain Location: calls out OMG Pain Descriptors / Indicators: Guarding, Moaning Pain Intervention(s): Limited activity within patient's tolerance, Monitored during session         Frequency  Min 1X/week        Progress Toward Goals  OT Goals(current goals can now be found in the care plan section)  Progress towards OT goals: Progressing toward goals     Plan Discharge plan remains appropriate       AM-PAC OT "6 Clicks" Daily Activity     Outcome Measure   Help from another person eating meals?: Total Help from another person taking care of personal grooming?: Total Help from another person toileting, which includes using toliet, bedpan, or urinal?: Total Help from another person bathing (including washing, rinsing, drying)?: Total Help from another person to put on and taking off regular upper body clothing?: Total Help from another person to put on and taking off regular lower body clothing?: Total 6 Click Score: 6    End of Session    OT Visit Diagnosis: Unsteadiness on feet (R26.81);Other abnormalities of gait and mobility (R26.89)   Activity Tolerance Patient limited by fatigue;Patient limited by lethargy   Patient Left in bed;with call bell/phone within reach;with bed alarm set;with family/visitor present;with nursing/sitter in room   Nurse Communication Mobility status        Time: 1610-9604 OT Time Calculation (min): 28 min  Charges: OT General Charges $OT Visit: 1 Visit OT  Treatments $Self Care/Home Management : 23-37 mins  Rosalio Loud, MS Acute Rehabilitation Department Office# [phone]   Selinda Flavin 12/06/2022, 9:22 AM

## 2022-12-06 NOTE — Progress Notes (Signed)
Speech Language Pathology Treatment: Dysphagia  Patient Details Name: Jaime Gomez MRN: [id number] DOB: June 25, 1966 Today's Date: 12/06/2022 Time: 7829-5621 SLP Time Calculation (min) (ACUTE ONLY): 21 min  Assessment / Plan / Recommendation Clinical Impression  Patient seen today to determine readiness for p.o. diet.  She is fully alert but does not follow directions instead SLP suspects she is completing motor tasks that are providing comfort.  She did verbalize x 2 during evaluation/treatment stating yes in a very clear voice with no dysarthria nor phonation deficits.  Patient consumed Ensure mixed with ice cream via straw approximately one half of container as well as approximately 1-1/2 ounces of apple juice.  Swallow initiation inconsistently appeared delayed this may be due to frontal CVA however there were no indications of aspiration.  With hand overhand assist to bring spoon with ice cream and graham cracker to mouth patient consumed perhaps 1/8 of a teaspoon of ice cream and did not attempt any solids.  She did not follow directions to open her mouth to allow SLP to assess for oral retention nor would she accept more intake to allow rapid view.  At this time SLP recognizes nutrition is paramount, and thus recommend full liquid diet with patient feeding herself even if hand overhand assistance is required.  Sister [name] in the room and was educated on to findings and recommendations and agreeable to plan.  SLP placed order and messaged dietitian Aspen who reports she will write order for Ensure.  Again anticipate patient's diet will be able to be advanced as her mentation continues to improve and strict aspiration aspiration precautions are indicated.  Recommend observe larynx elevate to assure the patient has swallowed and to give her intake only as she will allow.  SLP to follow, suspect patient is having language deficits due to her CVA.  As mentation improves she would benefit from SLP consult  for cognitive-linguistic evaluation.      HPI HPI: Jaime Gomez is a 57 y.o. 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 Cardiology consulted on 12/05/2022 for new CHF at the request of Dr. Nelson Chimes.  Pt with AMS suddenly and MRI showed subacute left frontoparietal infarcts with  additional punctate acute infarcts in the cerebellum and bilateral occipital lobes. CXR concerning for potential left pna.     Swallow eval ordered.  Sister, [name], reports pt has no prior history of dysphagia.      SLP Plan  Continue with current plan of care      Recommendations for follow up therapy are one component of a multi-disciplinary discharge planning process, led by the attending physician.  Recommendations may be updated based on patient status, additional functional criteria and insurance authorization.    Recommendations  Diet recommendations: Thin liquid (full liquids) Liquids provided via: Straw Medication Administration: Via alternative means (or crushed,suspension as tolerated) Supervision: Patient able to self feed;Trained caregiver to feed patient (Sister [name] was advised that patient needs to feed herself) Compensations: Slow rate;Small sips/bites Postural Changes and/or Swallow Maneuvers: Upright 30-60 min after meal;Seated upright 90 degrees                  Oral care BID (Encourage patient to brush her teeth as she will allow)     Other (comment);Dysphagia, unspecified (R13.10)  Continue with current plan of care  Rolena Infante, MS [facility] SLP Acute Rehab Services Office [phone]    Chales Abrahams  12/06/2022, 10:34 AM

## 2022-12-06 NOTE — Progress Notes (Signed)
Looks like this, might be waking up a little bit more.  She is opening her eyes.  She is moving around a little bit more.  There is still disorientation.  She is really not talking.  Hopefully, this is all neurotoxicity from her antibiotics.  I must say that I am never heard of this happening with antibiotics.  Her renal function is getting better.  Her BUN is 63.  Her creatinine is 3.19.  The nephrostomy bag is containing clear urine.  Her white cell count is 4.7.  Hemoglobin 10.8.  Platelet count is 186,000.  She still needs to get nutrition.  Maybe, if she wakes up a little bit more, she will be able to eat.  There is no fever.  She has had no obvious bleeding.  It is hard to say if there is any counter pain issues.  Her vital signs show temperature of 97.5.  Pulse 97.  Her blood pressure is quite high at 162/105.  Her lungs sound clear bilaterally.  She has good air movement bilaterally.  Cardiac exam regular rate and rhythm.  She has no murmurs, rubs or bruits.  Her abdomen is soft.  Bowel sounds are present.  There is no guarding or rebound tenderness.  Extremity shows no clubbing, cyanosis or edema.  She does have decent strength in upper and lower extremities.  Neurological exam still shows some disorientation.  She is more awake.  Her blood pressure clearly is a problem.  Hopefully, this can be put under better control.  I still do not see any evidence of infection causing the neurological issues.  She had Pseudomonas in the blood.  This was very sensitive to antibiotics.  Her Port-A-Cath was removed.  I think we just have to wait and see how she continues to recover.  Hopefully, she will be in a situation where she will be alert and be able to eat.  Again, I would not wait too long until we started to feed her.  I am just glad that she seems to be recovering slowly.  I do appreciate the great care she is getting from everybody on [address].  [name], MD  1 Cor 15:58

## 2022-12-06 NOTE — Progress Notes (Signed)
[facility] Infectious Diseases Follow Up Note  Patient Identification: Patient Name: Jaime Gomez MRN: [id number] Admit Date: 11/28/2022  8:13 AM Age: 57 y.o.Today's Date: 12/06/2022  Reason for Visit: bacteremia   Principal Problem:   Sepsis secondary to UTI [facility]) Active Problems:   Endometrial cancer, FIGO stage IVB (HCC)   Leg DVT (deep venous thromboembolism), chronic, right (HCC)   Acute renal failure superimposed on stage 3a chronic kidney disease (HCC)   Essential (primary) hypertension   Type 2 diabetes mellitus without complications (HCC)   Abnormality of lung on CXR   Metabolic acidosis with normal anion gap and failure of bicarbonate regeneration   Major depressive disorder, single episode, moderate (HCC)  Antibiotics:  Cefepime 5/7-5/12 Meropenem 5/13-c   Lines/Hardware: Rt chest port ( removed 5/13), Left sided nephrostomy  Interval Events: afebrile for more than 24 hrs Labs remarkable for Na 146, Cr 3.19, WBC 4.7  Assessment 57 year old female with h/o  DM, endometrial cancer on Femara and Afinitor ( follows Dr Myna Hidalgo) HTN, kidney stones chronic right leg DVT, anemia of CKD who presented to the ED on 5/7 with persistent fever, generalized bodyaches and weakness, flank pain and weakness  for few days. ID engaged for    # PsA bacteremia: Presumed to be related to Portacath although no external signs of infection ( removed 5/13). UA 5/7 with pyuria (? UTI). Left nephrostomy was exchanged 5/8. TTE 5/13 did not show vegetations. Repeat blood cx 5/9 NG in 5 days. Question about possible endocarditis with multiple CVAs, however felt unlikely by Cardiology as well as Neurology. Per Neurology CVA does not seems consistent with septic emboli.   # Acute encephalopathy - 2/2 infection, new CVA, uremia, cefepime neurotoxicity. EEG 5/13 with no seizure activity seen. 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 Given involvement of multiple vascular territories, consider an embolic etiology. Neurology following- less likely septic emboli. LP is considered more risk than benefit per Neurology - Improving   # New onset acute CHF - Cardiology consulted and thought to be Takotsubo cardiomyopathy   Recommendations Continue meropenem for now Case extensively discussed between Cardiology/Neurology and felt very unlikely to be endocarditis given uncommon organism, rapid clearance of blood cx, possible portacath being source and MRI brain not consistent to be septic emboli. She is not a TEE candidate per Cardiology currently, plan to repeat echo next week. No C/I for anticoagulation if indicated from ID standpoint  Monitor CBC and CMP  Will intermittently follow  Rest of the management as per the primary team. Thank you for the consult. Please page with pertinent questions or concerns.  ______________________________________________________________________ Subjective patient seen and examined at the bedside. Sister and nurse tech at bedside. They report patient is improving with able to say " yes or no", was able to walk to restroom/hallway. She ate some food.   Vitals BP (!) 177/100 (BP Location: Right Arm)   Pulse (!) 116   Temp (!) 97.3 F (36.3 C) (Axillary)   Resp 16   Ht 5\' 5"  (1.651 m)   Wt 60.8 kg   SpO2 99%   BMI 22.30 kg/m     Physical Exam Constitutional:  adult female lying in the bed with open eyes     Comments: Pupils bilaterally symmetrical   Cardiovascular:     Rate and Rhythm: Normal rate and regular rhythm.     Heart sounds: No murmur   Pulmonary:  Effort: Pulmonary effort is normal.     Comments: Normal breath sounds   Abdominal:     Palpations: Abdomen is soft.     Tenderness: non distended   Musculoskeletal:        General: No swelling or tenderness in peripheral joints.    Skin:    Comments: rt chest portacath removed site is bandaged , left nephrostomy with clear urine   Neurological:     General: opens eyes but did not talk /responded to me   Pertinent Microbiology Results for orders placed or performed during the hospital encounter of 11/28/22  Resp panel by RT-PCR (RSV, Flu A&B, Covid) Anterior Nasal Swab     Status: None   Collection Time: 11/28/22  8:44 AM   Specimen: Anterior Nasal Swab  Result Value Ref Range Status   SARS Coronavirus 2 by RT PCR NEGATIVE NEGATIVE Final    Comment: (NOTE) SARS-CoV-2 target nucleic acids are NOT DETECTED.  The SARS-CoV-2 RNA is generally detectable in upper respiratory specimens during the acute phase of infection. The lowest concentration of SARS-CoV-2 viral copies this assay can detect is 138 copies/mL. A negative result does not preclude SARS-Cov-2 infection and should not be used as the sole basis for treatment or other patient management decisions. A negative result may occur with  improper specimen collection/handling, submission of specimen other than nasopharyngeal swab, presence of viral mutation(s) within the areas targeted by this assay, and inadequate number of viral copies(<138 copies/mL). A negative result must be combined with clinical observations, patient history, and epidemiological information. The expected result is Negative.  Fact Sheet for Patients:  BloggerCourse.com  Fact Sheet for Healthcare Providers:  SeriousBroker.it  This test is no t yet approved or cleared by the [place] FDA and  has been authorized for detection and/or diagnosis of SARS-CoV-2 by FDA under an Emergency Use Authorization (EUA). This EUA will remain  in effect (meaning this test can be used) for the duration of the COVID-19 declaration under Section 564(b)(1) of the Act, 21 U.S.C.section 360bbb-3(b)(1), unless the authorization is terminated  or  revoked sooner.       Influenza A by PCR NEGATIVE NEGATIVE Final   Influenza B by PCR NEGATIVE NEGATIVE Final    Comment: (NOTE) The Xpert Xpress SARS-CoV-2/FLU/RSV plus assay is intended as an aid in the diagnosis of influenza from Nasopharyngeal swab specimens and should not be used as a sole basis for treatment. Nasal washings and aspirates are unacceptable for Xpert Xpress SARS-CoV-2/FLU/RSV testing.  Fact Sheet for Patients: BloggerCourse.com  Fact Sheet for Healthcare Providers: SeriousBroker.it  This test is not yet approved or cleared by the [place] FDA and has been authorized for detection and/or diagnosis of SARS-CoV-2 by FDA under an Emergency Use Authorization (EUA). This EUA will remain in effect (meaning this test can be used) for the duration of the COVID-19 declaration under Section 564(b)(1) of the Act, 21 U.S.C. section 360bbb-3(b)(1), unless the authorization is terminated or revoked.     Resp Syncytial Virus by PCR NEGATIVE NEGATIVE Final    Comment: (NOTE) Fact Sheet for Patients: BloggerCourse.com  Fact Sheet for Healthcare Providers: SeriousBroker.it  This test is not yet approved or cleared by the [place] FDA and has been authorized for detection and/or diagnosis of SARS-CoV-2 by FDA under an Emergency Use Authorization (EUA). This EUA will remain in effect (meaning this test can be used) for the duration of the COVID-19 declaration under Section 564(b)(1) of the Act, 21 U.S.C. section  360bbb-3(b)(1), unless the authorization is terminated or revoked.  Performed at [facility], [address] [place], [place] [address]   Blood Culture (routine x 2)     Status: Abnormal   Collection Time: 11/28/22  8:44 AM   Specimen: BLOOD  Result Value Ref Range Status   Specimen Description   Final    BLOOD BLOOD RIGHT  FOREARM Performed at [facility], [address] [place], [place] [address]    Special Requests   Final    Blood Culture results may not be optimal due to an excessive volume of blood received in culture bottles BOTTLES DRAWN AEROBIC AND ANAEROBIC Performed at [facility], [address] [place], [place] [address]    Culture  Setup Time   Final    GRAM NEGATIVE RODS ANAEROBIC BOTTLE ONLY CRITICAL RESULT CALLED TO, READ BACK BY AND VERIFIED WITH: L. Loletta Specter PHARMD, AT 1351 11/29/22 Renato Shin Performed at [facility], [address] [place], [place] [address]    Culture PSEUDOMONAS AERUGINOSA (A)  Final   Report Status 12/01/2022 FINAL  Final   Organism ID, Bacteria PSEUDOMONAS AERUGINOSA  Final      Susceptibility   Pseudomonas aeruginosa - MIC*    CEFTAZIDIME <=1 SENSITIVE Sensitive     CIPROFLOXACIN 0.5 SENSITIVE Sensitive     GENTAMICIN <=1 SENSITIVE Sensitive     IMIPENEM <=0.25 SENSITIVE Sensitive     PIP/TAZO <=4 SENSITIVE Sensitive     CEFEPIME 2 SENSITIVE Sensitive     * PSEUDOMONAS AERUGINOSA  Urine Culture     Status: Abnormal   Collection Time: 11/28/22  8:44 AM   Specimen: Urine, Random  Result Value Ref Range Status   Specimen Description   Final    URINE, RANDOM Performed at [facility], [address] [place], [place] [address]    Special Requests   Final    NONE Reflexed from [phone] Performed at [facility], [address] [place], [place] [address]    Culture (A)  Final    >=100,000 COLONIES/mL LACTOBACILLUS SPECIES Standardized susceptibility testing for this organism is not available. Performed at [facility], [address] [place], [place] [address]    Report Status 11/29/2022 FINAL  Final  Blood Culture ID Panel (Reflexed)     Status: Abnormal   Collection Time: 11/28/22  8:44 AM  Result Value Ref Range Status   Enterococcus faecalis NOT DETECTED NOT DETECTED Final   Enterococcus  Faecium NOT DETECTED NOT DETECTED Final   Listeria monocytogenes NOT DETECTED NOT DETECTED Final   Staphylococcus species NOT DETECTED NOT DETECTED Final   Staphylococcus aureus (BCID) NOT DETECTED NOT DETECTED Final   Staphylococcus epidermidis NOT DETECTED NOT DETECTED Final   Staphylococcus lugdunensis NOT DETECTED NOT DETECTED Final   Streptococcus species NOT DETECTED NOT DETECTED Final   Streptococcus agalactiae NOT DETECTED NOT DETECTED Final   Streptococcus pneumoniae NOT DETECTED NOT DETECTED Final   Streptococcus pyogenes NOT DETECTED NOT DETECTED Final   A.calcoaceticus-baumannii NOT DETECTED NOT DETECTED Final   Bacteroides fragilis NOT DETECTED NOT DETECTED Final   Enterobacterales NOT DETECTED NOT DETECTED Final   Enterobacter cloacae complex NOT DETECTED NOT DETECTED Final   Escherichia coli NOT DETECTED NOT DETECTED Final   Klebsiella aerogenes NOT DETECTED NOT DETECTED Final   Klebsiella oxytoca NOT DETECTED NOT DETECTED Final   Klebsiella pneumoniae NOT DETECTED NOT DETECTED Final  Proteus species NOT DETECTED NOT DETECTED Final   Salmonella species NOT DETECTED NOT DETECTED Final   Serratia marcescens NOT DETECTED NOT DETECTED Final   Haemophilus influenzae NOT DETECTED NOT DETECTED Final   Neisseria meningitidis NOT DETECTED NOT DETECTED Final   Pseudomonas aeruginosa DETECTED (A) NOT DETECTED Final    Comment: CRITICAL RESULT CALLED TO, READ BACK BY AND VERIFIED WITH: L. POINDEXTER PHARMD, AT 1351 11/29/22 D. VANHOOK    Stenotrophomonas maltophilia NOT DETECTED NOT DETECTED Final   Candida albicans NOT DETECTED NOT DETECTED Final   Candida auris NOT DETECTED NOT DETECTED Final   Candida glabrata NOT DETECTED NOT DETECTED Final   Candida krusei NOT DETECTED NOT DETECTED Final   Candida parapsilosis NOT DETECTED NOT DETECTED Final   Candida tropicalis NOT DETECTED NOT DETECTED Final   Cryptococcus neoformans/gattii NOT DETECTED NOT DETECTED Final   CTX-M ESBL NOT  DETECTED NOT DETECTED Final   Carbapenem resistance IMP NOT DETECTED NOT DETECTED Final   Carbapenem resistance KPC NOT DETECTED NOT DETECTED Final   Carbapenem resistance NDM NOT DETECTED NOT DETECTED Final   Carbapenem resistance VIM NOT DETECTED NOT DETECTED Final    Comment: Performed at [facility], [address], [place], [place] [address]  Blood Culture (routine x 2)     Status: None   Collection Time: 11/28/22  9:02 AM   Specimen: BLOOD  Result Value Ref Range Status   Specimen Description   Final    BLOOD RIGHT ANTECUBITAL Performed at [facility], [address] [place], [place] [address]    Special Requests   Final    Blood Culture adequate volume BOTTLES DRAWN AEROBIC AND ANAEROBIC Performed at [facility], [address], [place], [place] [address]    Culture   Final    NO GROWTH 5 DAYS Performed at [facility], [address] [place], [place] [address]    Report Status 12/03/2022 FINAL  Final  Culture, blood (Routine X 2) w Reflex to ID Panel     Status: None   Collection Time: 11/30/22  6:25 PM   Specimen: BLOOD RIGHT ARM  Result Value Ref Range Status   Specimen Description   Final    BLOOD RIGHT ARM Performed at [facility], [address] [place], [place] [address]    Special Requests   Final    BOTTLES DRAWN AEROBIC ONLY Blood Culture adequate volume Performed at [facility], [address], [place], [place] [address]    Culture   Final    NO GROWTH 5 DAYS Performed at [facility], [address] [place], [place] [address]    Report Status 12/05/2022 FINAL  Final  Culture, blood (Routine X 2) w Reflex to ID Panel     Status: None   Collection Time: 11/30/22  6:30 PM   Specimen: BLOOD LEFT HAND  Result Value Ref Range Status   Specimen Description   Final    BLOOD LEFT HAND Performed at [facility], [address] [place], [place] [address]    Special Requests   Final     BOTTLES DRAWN AEROBIC ONLY Blood Culture adequate volume Performed at [facility], [address] [place], [place] [address]    Culture   Final    NO GROWTH 5 DAYS Performed at [facility], [address] [place], [place] [address]    Report Status  12/05/2022 FINAL  Final   Pertinent Lab.    Latest Ref Rng & Units 12/06/2022    3:59 AM 12/05/2022    6:37 AM 12/04/2022    2:33 AM  CBC  WBC 4.0 - 10.5 K/uL 4.7  5.5  4.1   Hemoglobin 12.0 - 15.0 g/dL 54.0  98.1  9.9   Hematocrit 36.0 - 46.0 % 34.3  34.9  30.9   Platelets 150 - 400 K/uL 186  208  187       Latest Ref Rng & Units 12/06/2022    3:59 AM 12/05/2022    6:37 AM 12/04/2022    2:33 AM  CMP  Glucose 70 - 99 mg/dL 191  478  295   BUN 6 - 20 mg/dL 63  64  73   Creatinine 0.44 - 1.00 mg/dL 6.21  3.08  6.57   Sodium 135 - 145 mmol/L 146  143  139   Potassium 3.5 - 5.1 mmol/L 3.5  3.7  3.4   Chloride 98 - 111 mmol/L 113  113  110   CO2 22 - 32 mmol/L 20  16  15    Calcium 8.9 - 10.3 mg/dL 9.0  8.8  8.2      Pertinent Imaging today Plain films and CT images have been personally visualized and interpreted; radiology reports have been reviewed. Decision making incorporated into the Impression   No results found. ' I have personally spent 37  minutes involved in face-to-face and non-face-to-face activities for this patient on the day of the visit. Professional time spent includes the following activities: Preparing to see the patient (review of tests), Obtaining and/or reviewing separately obtained history (admission/discharge record), Performing a medically appropriate examination and/or evaluation , Ordering medications/tests/procedures, referring and communicating with other health care professionals, Documenting clinical information in the EMR, Independently interpreting results (not separately reported), Communicating results to the patient/family/caregiver, Counseling and educating the  patient/family/caregiver and Care coordination (not separately reported).   Plan d/w requesting provider as well as ID pharm D  Note: This document was prepared using dragon voice recognition software and may include unintentional dictation errors.   Electronically signed by:   Odette Fraction, MD Infectious Disease Physician [facility] Pager: [phone]

## 2022-12-06 NOTE — Progress Notes (Addendum)
Rounding Note    Patient Name: Jaime Gomez Date of Encounter: 12/06/2022  [facility] Cardiologist: None   Subjective   No acute overnight events. Patient is currently very lethargic after receiving pain medication but per nurse tech who has been sitting for her this morning, she was more interactive this morning and was able to sit up on the side of the bed, say yes/no, and drink ensure and apple juice. She will very briefly open her eyes for me but will not respond to commands such as squeeze my hand.  Inpatient Medications    Scheduled Meds:  acetaminophen  1,000 mg Oral Once   carvedilol  12.5 mg Oral BID WC   Chlorhexidine Gluconate Cloth  6 each Topical Daily   feeding supplement  237 mL Oral TID BM   folic acid  1 mg Oral Q0600   gabapentin  100 mg Oral BID   insulin aspart  0-9 Units Subcutaneous TID WC   labetalol  5 mg Intravenous Q6H   mirtazapine  7.5 mg Oral QHS   sodium bicarbonate  650 mg Oral TID   Continuous Infusions:  sodium chloride 75 mL/hr at 12/06/22 1019   meropenem (MERREM) IV 1 g (12/06/22 1021)   PRN Meds: acetaminophen **OR** acetaminophen, guaiFENesin, hydrALAZINE, HYDROmorphone (DILAUDID) injection, ipratropium-albuterol, labetalol, methocarbamol, OLANZapine, ondansetron **OR** ondansetron (ZOFRAN) IV, oxyCODONE, polyethylene glycol, senna-docusate, sorbitol   Vital Signs    Vitals:   12/05/22 2025 12/06/22 0013 12/06/22 0532 12/06/22 0606  BP: (!) 168/91 (!) 165/93 (!) 178/108 (!) 162/105  Pulse: 96 100 (!) 108 97  Resp: 14  14   Temp: (!) 97.5 F (36.4 C)  (!) 97.5 F (36.4 C)   TempSrc: Axillary  Axillary   SpO2: 97% 98% 99% 97%  Weight:      Height:        Intake/Output Summary (Last 24 hours) at 12/06/2022 1101 Last data filed at 12/06/2022 1000 Gross per 24 hour  Intake 340 ml  Output 2150 ml  Net -1810 ml      12/03/2022    7:07 PM 11/28/2022    8:19 AM 11/16/2022    3:10 PM  Last 3 Weights  Weight (lbs) 134  lb 134 lb 134 lb  Weight (kg) 60.782 kg 60.782 kg 60.782 kg      Telemetry    Normal sinus rhythm with occasional PVCs and ventricular couplets. Baseline rates in the 70s to 80s. But rates will increase to the low 100s at times (suspect this is when she goes to exert herself).  - Personally Reviewed  ECG    No new ECG tracing today. - Personally Reviewed  Physical Exam   General: 57 y.o. Caucasian female resting comfortably in no acute distress. HEENT: Normocephalic and atraumatic. Sclera clear.  Neck: Supple. No JVD. Heart: RRR. Distinct S1 and S2. No murmurs, gallops, or rubs.  Lungs: No increased work of breathing. Clear to ausculation bilaterally. No wheezes, rhonchi, or rales.  Abdomen: Soft, non-distended, and non-tender to palpation.  Extremities: No lower extremity edema.    Skin: Warm and dry. Neuro: Lethargic/ encephalopathic. Briefly opened eyes but did not follow commands. No gross focal deficits. Psych: Lethargic/ encephalopathic.  Labs    High Sensitivity Troponin:  No results for input(s): "TROPONINIHS" in the last 720 hours.   Chemistry Recent Labs  Lab 12/04/22 0233 12/05/22 0637 12/06/22 0359  NA 139 143 146*  K 3.4* 3.7 3.5  CL 110 113* 113*  CO2  15* 16* 20*  GLUCOSE 203* 140* 113*  BUN 73* 64* 63*  CREATININE 3.65* 3.48* 3.19*  CALCIUM 8.2* 8.8* 9.0  MG 1.8 1.8 1.8  GFRNONAA 14* 15* 16*  ANIONGAP 14 14 13     Lipids  Recent Labs  Lab 12/04/22 0232  CHOL 162  TRIG 199*  HDL 29*  LDLCALC 93  CHOLHDL 5.6    Hematology Recent Labs  Lab 12/04/22 0233 12/05/22 0637 12/06/22 0359  WBC 4.1 5.5 4.7  RBC 3.41* 3.90 3.77*  HGB 9.9* 11.3* 10.8*  HCT 30.9* 34.9* 34.3*  MCV 90.6 89.5 91.0  MCH 29.0 29.0 28.6  MCHC 32.0 32.4 31.5  RDW 16.0* 16.2* 16.4*  PLT 187 208 186   Thyroid  Recent Labs  Lab 12/03/22 1335  TSH 2.520    BNP Recent Labs  Lab 12/03/22 0255  BNP 2,665.1*    DDimer No results for input(s): "DDIMER" in the last  168 hours.   Radiology    IR REMOVAL TUN ACCESS W/ PORT W/O FL MOD SED  Result Date: 12/04/2022 CLINICAL DATA:  Sepsis.  Pseudomonas bacteremia. EXAM: REMOVAL OF IMPLANTED TUNNELED PORT-A-CATH MEDICATIONS: 10 mL lidocaine 1%. ANESTHESIA/SEDATION: Local anesthetic was administered. FLUOROSCOPY TIME:  None PROCEDURE: Informed written consent was obtained from the patient and/or patient's representative after a discussion of the risk, benefits and alternatives to the procedure. The patient was positioned supine on the fluoroscopy table and the RIGHT chest Port-A-Cath site was prepped with chlorhexidine. A sterile gown and gloves were worn during the procedure. Local anesthesia was provided with 1% lidocaine with epinephrine. A timeout was performed prior to the initiation of the procedure. An incision was made overlying the Port-A-Cath with a #15 scalpel. Utilizing sharp and blunt dissection, the Port-A-Cath was removed completely. The pocked was irrigated with sterile saline. Wound closure was performed with interrupted subcutaneous 2-0 Vicryl sutures then Dermabond was applied at the skin. Dressings were applied. The patient tolerated the procedure well without immediate post procedural complication. FINDINGS: Removal of implant Port-A-Cath without immediate post procedural complication. IMPRESSION: Successful removal of an implanted RIGHT chest Port-A-Cath. Roanna Banning, MD [facility] Electronically Signed   By: Roanna Banning M.D.   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 EEG to evaluate for seizure Level of alertness:  lethargic AEDs during EEG study: GBP Technical aspects: This EEG study was done with scalp electrodes positioned according to the 10-20 International system of electrode placement. Electrical activity was reviewed with band pass filter of 1-70Hz , sensitivity of 7 uV/mm, display speed of 68mm/sec with a 60Hz  notched filter applied as appropriate. EEG data were recorded continuously and digitally stored.  Video monitoring was available and reviewed as appropriate. Description: EEG showed continuous generalized 3 to 5 Hz theta-delta slowing. Generalized periodic discharges with triphasic morphology at 1-1.5 Hz were also noted. Hyperventilation and photic stimulation were not performed.   ABNORMALITY - Periodic discharges with triphasic morphology, generalized (  GPDs) - Continuous slow, generalized IMPRESSION: This study showed generalized periodic discharges with triphasic morphology which can be on the ictal-interictal continuum. However, the morphology, frequency is more likely suggestive of toxic-metabolic causes. Additionally there is moderate to severe diffuse encephalopathy. No seizures were seen throughout the recording. Priyanka O Yadav   VAS US CAROTID  Result Date: 12/04/2022 Carotid Arterial Duplex Study Patient Name:  LASHANIQUE GARCIAGARCIA  Date of Exam:   12/04/2022 Medical Rec #: [id number]     Accession #:    [id number] Date of Birth: 1965-11-02     Patient Gender: F Patient Age:   49 years Exam Location:  [facility] Procedure:      VAS US CAROTID Referring Phys: Stephania Fragmin --------------------------------------------------------------------------------  Indications:       CVA. Risk Factors:      Hypertension, Diabetes, CKD, no history of smoking.  Limitations        Today's exam was limited due to patient unable to follow                    commands / position properly. Comparison Study:  No previous exams Performing Technologist: Jody Hill RVT, RDMS  Examination Guidelines: A complete evaluation includes B-mode imaging, spectral Doppler, color Doppler, and power Doppler as needed of all accessible portions of each vessel. Bilateral testing is considered an integral part of a complete examination. Limited examinations for reoccurring indications may be performed as noted.  Right Carotid Findings: +----------+--------+--------+--------+------------------+------------------+           PSV cm/sEDV cm/sStenosisPlaque DescriptionComments           +----------+--------+--------+--------+------------------+------------------+ CCA Prox  46      11                                intimal thickening +----------+--------+--------+--------+------------------+------------------+ CCA Distal51      16                                                   +----------+--------+--------+--------+------------------+------------------+ ICA Prox  50      19                                                   +----------+--------+--------+--------+------------------+------------------+ ICA Distal55      14                                                   +----------+--------+--------+--------+------------------+------------------+ ECA       55      6                                                    +----------+--------+--------+--------+------------------+------------------+ +----------+--------+-------+----------------+-------------------+           PSV cm/sEDV cmsDescribe        Arm Pressure (mmHG) +----------+--------+-------+----------------+-------------------+ [id number]  Multiphasic, WNL                    +----------+--------+-------+----------------+-------------------+  +---------+--------+--+--------+-+--------------+ VertebralPSV cm/s24EDV cm/s3High resistant +---------+--------+--+--------+-+--------------+  Left Carotid Findings: +----------+--------+--------+--------+------------------+------------------+           PSV cm/sEDV cm/sStenosisPlaque DescriptionComments           +----------+--------+--------+--------+------------------+------------------+ CCA Prox  56      8                                                    +----------+--------+--------+--------+------------------+------------------+ CCA Distal56      8                                 intimal thickening +----------+--------+--------+--------+------------------+------------------+ ICA Prox  54      16                                                   +----------+--------+--------+--------+------------------+------------------+ ICA Distal64      17                                                   +----------+--------+--------+--------+------------------+------------------+ ECA       60      6                                                    +----------+--------+--------+--------+------------------+------------------+ +----------+--------+--------+----------------+-------------------+           PSV cm/sEDV cm/sDescribe        Arm Pressure (mmHG) +----------+--------+--------+----------------+-------------------+ [id number]              Multiphasic, WNL                    +----------+--------+--------+----------------+-------------------+ +---------+--------+--+--------+-+---------+ VertebralPSV cm/s62EDV cm/s7Antegrade +---------+--------+--+--------+-+---------+   Summary: Right Carotid: The extracranial vessels were near-normal with only minimal wall                thickening or plaque. Left Carotid: The extracranial vessels were near-normal with only minimal wall               thickening or plaque. Vertebrals:  Left vertebral artery  demonstrates antegrade flow. Right vertebral              artery demonstrates high resistant flow. Subclavians: Normal flow hemodynamics were seen in bilateral subclavian              arteries. *See table(s) above for measurements and observations.     Preliminary    ECHOCARDIOGRAM COMPLETE  Result Date: 12/04/2022    ECHOCARDIOGRAM REPORT   Patient Name:   ALYVIAH MARTZ Date of Exam: 12/04/2022 Medical Rec #:  [id number]    Height:       65.0 in Accession #:    [id number]   Weight:       134.0 lb  Date of Birth:  01-20-1966    BSA:          1.669 m Patient Age:    56 years     BP:           145/78 mmHg Patient Gender: F            HR:           86 bpm. Exam Location:  Inpatient Procedure: 2D Echo, Color Doppler and Cardiac Doppler Indications:    Bacteremia  History:        Patient has no prior history of Echocardiogram examinations.                 Risk Factors:Hypertension and Diabetes. Endometrial Cancer, DVT,                 CKD, Sepsis.  Sonographer:    Milbert Coulter Referring Phys: [id number] [facility] IMPRESSIONS  1. Global hypokinesis with apical akinesis; overall moderate LV dysfunction.  2. Left ventricular ejection fraction, by estimation, is 30 to 35%. The left ventricle has moderately decreased function. The left ventricle demonstrates regional wall motion abnormalities (see scoring diagram/findings for description). The left ventricular internal cavity size was mildly dilated. Left ventricular diastolic parameters are consistent with Grade I diastolic dysfunction (impaired relaxation).  3. Right ventricular systolic function is normal. The right ventricular size is normal.  4. The mitral valve is normal in structure. Trivial mitral valve regurgitation. No evidence of mitral stenosis.  5. The aortic valve is tricuspid. Aortic valve regurgitation is not visualized. No aortic stenosis is present.  6. The inferior vena cava is normal in size with greater than 50% respiratory variability, suggesting  right atrial pressure of 3 mmHg. Comparison(s): No prior Echocardiogram. FINDINGS  Left Ventricle: Left ventricular ejection fraction, by estimation, is 30 to 35%. The left ventricle has moderately decreased function. The left ventricle demonstrates regional wall motion abnormalities. The left ventricular internal cavity size was mildly dilated. There is no left ventricular hypertrophy. Left ventricular diastolic parameters are consistent with Grade I diastolic dysfunction (impaired relaxation). Right Ventricle: The right ventricular size is normal. Right ventricular systolic function is normal. Left Atrium: Left atrial size was normal in size. Right Atrium: Right atrial size was normal in size. Pericardium: Trivial pericardial effusion is present. Mitral Valve: The mitral valve is normal in structure. Trivial mitral valve regurgitation. No evidence of mitral valve stenosis. Tricuspid Valve: The tricuspid valve is normal in structure. Tricuspid valve regurgitation is not demonstrated. No evidence of tricuspid stenosis. Aortic Valve: The aortic valve is tricuspid. Aortic valve regurgitation is not visualized. No aortic stenosis is present. Aortic valve mean gradient measures 3.0 mmHg. Aortic valve peak gradient measures 6.0 mmHg. Aortic valve area, by VTI measures 2.74 cm. Pulmonic Valve: The pulmonic valve was normal in structure. Pulmonic valve regurgitation is not visualized. No evidence of pulmonic stenosis. Aorta: The aortic root is normal in size and structure. Venous: The inferior vena cava is normal in size with greater than 50% respiratory variability, suggesting right atrial pressure of 3 mmHg. IAS/Shunts: No atrial level shunt detected by color flow Doppler. Additional Comments: Global hypokinesis with apical akinesis; overall moderate LV dysfunction.  LEFT VENTRICLE PLAX 2D LVIDd:         5.40 cm     Diastology LVIDs:         3.80 cm     LV e' medial:    4.79 cm/s LV PW:  0.90 cm     LV E/e'  medial:  13.2 LV IVS:        0.90 cm     LV e' lateral:   3.59 cm/s LVOT diam:     2.10 cm     LV E/e' lateral: 17.7 LV SV:         59 LV SV Index:   35 LVOT Area:     3.46 cm  LV Volumes (MOD) LV vol d, MOD A2C: 80.0 ml LV vol d, MOD A4C: 78.0 ml LV vol s, MOD A2C: 64.6 ml LV vol s, MOD A4C: 44.5 ml LV SV MOD A2C:     15.4 ml LV SV MOD A4C:     78.0 ml LV SV MOD BP:      27.8 ml RIGHT VENTRICLE RV Basal diam:  2.60 cm RV Mid diam:    2.50 cm RV S prime:     10.60 cm/s TAPSE (M-mode): 1.9 cm LEFT ATRIUM             Index        RIGHT ATRIUM          Index LA diam:        3.20 cm 1.92 cm/m   RA Area:     5.91 cm LA Vol (A2C):   35.7 ml 21.40 ml/m  RA Volume:   7.95 ml  4.76 ml/m LA Vol (A4C):   40.6 ml 24.33 ml/m LA Biplane Vol: 38.7 ml 23.19 ml/m  AORTIC VALVE AV Area (Vmax):    2.90 cm AV Area (Vmean):   2.65 cm AV Area (VTI):     2.74 cm AV Vmax:           122.00 cm/s AV Vmean:          80.100 cm/s AV VTI:            0.215 m AV Peak Grad:      6.0 mmHg AV Mean Grad:      3.0 mmHg LVOT Vmax:         102.00 cm/s LVOT Vmean:        61.300 cm/s LVOT VTI:          0.170 m LVOT/AV VTI ratio: 0.79  AORTA Ao Root diam: 3.20 cm Ao Asc diam:  3.40 cm MITRAL VALVE MV Area (PHT): 3.77 cm    SHUNTS MV Decel Time: 201 msec    Systemic VTI:  0.17 m MV E velocity: 63.40 cm/s  Systemic Diam: 2.10 cm MV A velocity: 85.30 cm/s MV E/A ratio:  0.74 Olga Millers MD Electronically signed by Olga Millers MD Signature Date/Time: 12/04/2022/4:08:12 PM    Final    MR ANGIO HEAD WO CONTRAST  Result Date: 12/04/2022 CLINICAL DATA:  Stroke/TIA, determine embolic source. EXAM: MRA HEAD WITHOUT CONTRAST TECHNIQUE: Angiographic images of the Circle of Willis were acquired using MRA technique without intravenous contrast. COMPARISON:  Head MRI 12/04/2022 FINDINGS: The study is motion degraded, particularly at the level of the circle of Willis and more superiorly. Anterior circulation: The internal carotid arteries are patent from  skull base to carotid termini with suboptimal assessment of the supraclinoid segments but without evidence of a significant stenosis more proximally. ACAs and MCAs are grossly patent, however motion artifact precludes detailed assessment of the A1, M1, M2, and proximal A2 segments. No large aneurysm is identified. Posterior circulation: The included intracranial portion of the left vertebral artery is widely patent to the basilar and  strongly dominant. The distal right vertebral artery is small and not well evaluated, likely congenitally hypoplastic. Patent bilateral PICA, left AICA, and bilateral SCA origins are visualized. The basilar artery is widely patent. The right PCA is patent without evidence a flow limiting proximal stenosis. The left PCA is poorly visualized. Comparing with today's earlier head MRI, there is likely a fetal origin of the left PCA. Faint signal is present in the left posterior communicating artery and proximal left P2 segment with assessment limited by artifact. No aneurysm is identified. Anatomic variants: Suspected fetal left PCA. IMPRESSION: Motion degraded examination, particularly severe at the level of the circle of Willis. No gross large vessel occlusion in the anterior circulation. Poor evaluation of the left PCA. Electronically Signed   By: Sebastian Ache M.D.   On: 12/04/2022 16:00    Cardiac Studies   Echocardiogram 12/04/2022: Impressions: 1. Global hypokinesis with apical akinesis; overall moderate LV  dysfunction.   2. Left ventricular ejection fraction, by estimation, is 30 to 35%. The  left ventricle has moderately decreased function. The left ventricle  demonstrates regional wall motion abnormalities (see scoring  diagram/findings for description). The left  ventricular internal cavity size was mildly dilated. Left ventricular  diastolic parameters are consistent with Grade I diastolic dysfunction  (impaired relaxation).   3. Right ventricular systolic function  is normal. The right ventricular  size is normal.   4. The mitral valve is normal in structure. Trivial mitral valve  regurgitation. No evidence of mitral stenosis.   5. The aortic valve is tricuspid. Aortic valve regurgitation is not  visualized. No aortic stenosis is present.   6. The inferior vena cava is normal in size with greater than 50%  respiratory variability, suggesting right atrial pressure of 3 mmHg.   Comparison(s): No prior Echocardiogram.   Patient Profile     57 y.o. 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 Hospitalization has been complicated AKI, metabolic encephalopathy, acute vs subactue CVA (possible septic emboli), and acute on chronic anemia requiring blood transfusion.  Cardiology was consulted on 12/05/2022 for further evaluation of new cardiomyopathy.  Assessment & Plan    New Diagnosed HFrEF Suspected Takotsubo Cardiomyopathy  Patient was admitted with sepsis secondary to UTI. Echo this admission showed LVEF of 30-35% with global hypokinesis and apical akinesis as well as grade 1 diastolic dysfunction.  Per Dr. Erin Hearing review of Echo, EF looked closer to 40% and there was apical ballooning pattern strongly suggestive of Takotubo syndrome. BNP elevated at 2,665. Chest x-ray on 12/03/2022 showed new hazy opacity in the left perihilar region which could represent developing pneumonia but no overt edema. - Euvolemic on exam. - No need for diuretics at this time. - GDMT is limited by renal function. She was made NPO due to encephalopathy but was seen by SLT yesterday and moved to thin liquid diet. Therefore, would resume home Coreg 12.5mg  twice daily. Can also consider adding Hydralazine/ Imdur given BP elevated. No  ACEi/ARB/ARNI, MRA, or SGLT2 inhibitor right now given renal function. - Etiology felt to like be stress-induced cardiomyopathy. Not a candidate for cardiac catheterization at this time given severe CKD with superimposed AKI, recent sepsis, and ongoing altered mental status. Risk of cath not felt to be worth the additional information it would  give [place]. Recommend repeat and TTE roughly in the next week. If regional wall motion abnormalities have resolved by then, would assume that the diagnosis of stress cardiomyopathy is correct and would not pursue further diagnostic testing.   Hypertension BP elevated with BP in the 150s-170s/90s-100s. - Continue medications for CHF as above.  Bacteremia Blood cultures positive for pseudomonas. Source unclear. Port-A-Cath was removed on 12/04/2022 for possible source. ID had concern for possible endocarditis given multiple embolic stroke on brain MRI although pseudomonas unlikely to cause endocarditis. - Continue IV antibiotics per ID. - ID recommended considering TEE. Will discuss with Dr. Royann Shivers but given patient is still encephalopathic, it may be best to wait to see if mentation improves. TEE will not change short-term management given she is already being treated with IV antibiotics.    Otherwise, per primary team: - Sepsis secondary to UTI - Pseudomonas bacteremia - Metabolic encephalopathy - Metabolic acidosis - Acute/ subacute CVA - AKI superimposed on CKD Stage IV: Creatinine peaked at 5.15 and continues to slowly improve (3.19 today). - Chronic right lower extremity DVT: On Arixtra at home but this has been stopped given renal function. She was switched to IV Heparin but this was then stopped due to suspicion for septic emboli with high risk of hemorrhagic transforation of his CVA. - Anemia of chronic disease" S/p 1 unit of PRBCs. Hemoglobin stable at 10.8 today. - Type 2 diabetes mellitus   For questions or updates, please contact [facility] Please consult [email or web address] for contact info under        Signed, Corrin Parker, PA-C  12/06/2022, 11:01 AM    I have seen and examined the patient along with Corrin Parker, PA-C .  I have reviewed the chart, notes and new data.  I agree with PA/NP's note.  Key new complaints: Reportedly was more interactive earlier today and even used some monosyllabic answers appropriately, walked with support, but right now she is again moaning unintelligibly and appears restless. Key examination changes: Afebrile.  No murmurs on exam.  No pulmonary rales. Key new findings / data: Normal WBC.  Blood cultures from 11/30/2022 remain no growth to date.  PLAN: No clinical evidence of heart failure exacerbation. Strongly doubt a diagnosis of infective endocarditis due to the rapid improvement with antibiotics, the bacteremia with an agent that is highly unlikely to cause endocarditis.  Plan to repeat a transthoracic echocardiogram early next week to assess resolution of what is suspected to be Takotsubo cardiomyopathy and take another look at the heart valves. I do not think a TEE is necessary at this time and I do not think she is capable of cooperating with one anyway. She is starting to take p.o. medications again need to gradually reintroduce her heart failure medicines.  Not a candidate for RAAS inhibitors due to severe renal dysfunction.  Thurmon Fair, MD, [facility] [phone] 12/06/2022, 2:33 PM

## 2022-12-07 ENCOUNTER — Other Ambulatory Visit: Payer: 59

## 2022-12-07 ENCOUNTER — Inpatient Hospital Stay: Payer: 59

## 2022-12-07 ENCOUNTER — Ambulatory Visit: Payer: 59 | Admitting: Hematology & Oncology

## 2022-12-07 DIAGNOSIS — A419 Sepsis, unspecified organism: Secondary | ICD-10-CM | POA: Diagnosis not present

## 2022-12-07 DIAGNOSIS — N390 Urinary tract infection, site not specified: Secondary | ICD-10-CM | POA: Diagnosis not present

## 2022-12-07 DIAGNOSIS — G9341 Metabolic encephalopathy: Secondary | ICD-10-CM | POA: Diagnosis not present

## 2022-12-07 DIAGNOSIS — I63423 Cerebral infarction due to embolism of bilateral anterior cerebral arteries: Secondary | ICD-10-CM | POA: Diagnosis not present

## 2022-12-07 DIAGNOSIS — I10 Essential (primary) hypertension: Secondary | ICD-10-CM | POA: Diagnosis not present

## 2022-12-07 LAB — BASIC METABOLIC PANEL
Anion gap: 14 (ref 5–15)
BUN: 50 mg/dL — ABNORMAL HIGH (ref 6–20)
CO2: 19 mmol/L — ABNORMAL LOW (ref 22–32)
Calcium: 8.3 mg/dL — ABNORMAL LOW (ref 8.9–10.3)
Chloride: 108 mmol/L (ref 98–111)
Creatinine, Ser: 2.62 mg/dL — ABNORMAL HIGH (ref 0.44–1.00)
GFR, Estimated: 21 mL/min — ABNORMAL LOW (ref >60)
Glucose, Bld: 162 mg/dL — ABNORMAL HIGH (ref 70–99)
Potassium: 3 mmol/L — ABNORMAL LOW (ref 3.5–5.1)
Sodium: 141 mmol/L (ref 135–145)

## 2022-12-07 LAB — GLUCOSE, CAPILLARY
Glucose-Capillary: 149 mg/dL — ABNORMAL HIGH (ref 70–99)
Glucose-Capillary: 154 mg/dL — ABNORMAL HIGH (ref 70–99)
Glucose-Capillary: 168 mg/dL — ABNORMAL HIGH (ref 70–99)
Glucose-Capillary: 158 mg/dL — ABNORMAL HIGH (ref 70–99)

## 2022-12-07 LAB — HEPARIN LEVEL (UNFRACTIONATED): Heparin Unfractionated: 0.22 IU/mL — ABNORMAL LOW (ref 0.30–0.70)

## 2022-12-07 MED ORDER — OXYBUTYNIN CHLORIDE ER 5 MG PO TB24
10.0000 mg | ORAL_TABLET | Freq: Every day | ORAL | Status: DC
  Administered 2022-12-07 – 2022-12-12 (×6): 10 mg via ORAL
  Filled 2022-12-07 (×6): qty 2

## 2022-12-07 MED ORDER — ISOSORB DINITRATE-HYDRALAZINE 20-37.5 MG PO TABS
1.0000 | ORAL_TABLET | Freq: Three times a day (TID) | ORAL | Status: DC
  Administered 2022-12-07 – 2022-12-11 (×15): 1 via ORAL
  Filled 2022-12-07 (×16): qty 1

## 2022-12-07 MED ORDER — POTASSIUM CHLORIDE 20 MEQ PO PACK
40.0000 meq | PACK | Freq: Once | ORAL | Status: DC
  Filled 2022-12-07 (×2): qty 2

## 2022-12-07 MED ORDER — POTASSIUM CHLORIDE CRYS ER 20 MEQ PO TBCR
40.0000 meq | EXTENDED_RELEASE_TABLET | Freq: Once | ORAL | Status: AC
  Administered 2022-12-07: 40 meq via ORAL
  Filled 2022-12-07: qty 2

## 2022-12-07 MED ORDER — MELATONIN 3 MG PO TABS
3.0000 mg | ORAL_TABLET | Freq: Every day | ORAL | Status: DC
  Administered 2022-12-07 – 2022-12-11 (×5): 3 mg via ORAL
  Filled 2022-12-07 (×5): qty 1

## 2022-12-07 MED ORDER — LORAZEPAM 0.5 MG PO TABS: 0.5000 mg | ORAL_TABLET | Freq: Three times a day (TID) | ORAL | Status: DC | PRN

## 2022-12-07 MED ORDER — POTASSIUM CHLORIDE CRYS ER 20 MEQ PO TBCR: 40.0000 meq | EXTENDED_RELEASE_TABLET | Freq: Two times a day (BID) | ORAL | Status: DC

## 2022-12-07 MED ORDER — HEPARIN (PORCINE) 25000 UT/250ML-% IV SOLN
850.0000 [IU]/h | INTRAVENOUS | Status: DC
  Administered 2022-12-07: 750 [IU]/h via INTRAVENOUS
  Filled 2022-12-07: qty 250

## 2022-12-07 NOTE — Progress Notes (Signed)
[facility] PROGRESS NOTE    Progress Note  Jaime Gomez  ZOX:[id number] DOB: [birth year] DOA: 11/28/2022 PCP: Staci Acosta, FNP     Brief Narrative:   Jaime Gomez is an 57 y.o. female past medical history significant for endometrial cancer on oral chemotherapy, chronic kidney disease stage III, iron deficiency anemia, essential hypertension diabetes mellitus type 2 history of right lower extremity DVT while on Arixtra, will with a left-sided nephrostomy tube exchange on 11/29/2022 patient admitted for sepsis due to UTI IR consulted nephrostomy tube was placed.  She is status post 2 units of packed red blood cells Hospital course was complicated by acute kidney injury which is slowly improving and further complicated by multifocal CVA concern for embolic versus septic, neurology infectious disease were consulted.    Significant events: Status post percutaneous tube exchange on 11/29/2022. Port has been removed on 12/04/2022. Port-A-Cath removed on 12/04/2022. Transesophageal echo did not show vegetation 12/04/2022.   Assessment/Plan:   Sepsis secondary to UTI (HCC)/Pseudomonas bacteremia: Blood cultures positive for Pseudomonas. Currently on IV meropenem. Not a candidate for TEE, there is no concern for septic emboli no contraindication to anticoagulation Surveillance blood cultures on 11/30/2022 have been negative till date. Has remained afebrile. Physical therapy evaluated the patient recommended skilled nursing facility.  Newly diagnosed HFrEF/Takotsubo cardiomyopathy : Cardiology was consulted and is concerned about stress cardiomyopathy/Takotsubo syndrome. Likely due to Pseudomonas sepsis. Repeat echocardiogram in 1 to 2 weeks to evaluate wall motion abnormality and EF. Will go ahead and start BiDil. Ischemic workup Has been deferred for now.  Acute/subacute CVA/acute encephalopathy: MRI of the brain showed multiple infarcts. Neurology was consulted.  MRI showed no large  vessel occlusions. EEG showed possibly cefepime toxicity.  Acute metabolic encephalopathy: Multifactorial likely due to infectious etiology and cefepime toxicity. Cefepime has been discontinued. Neurology recommended no further workup. Her mentation continues to improve.  Acute kidney injury superimposed on chronic kidney disease stage IIIa: Baseline creatinine of 2.6 on admission 4.9. Treated conservatively with IV fluids slowly improving. Renal ultrasound showed chronic renal disease. Creatinine this morning is 3.1. As she is n.p.o. and encephalopathic with no oral intake started on gentle IV fluids.  Hypomagnesemia/hypokalemia: Repeat check the potassium greater than 4 magnesium greater than 2.  Chronic right lower extremity DVT: Previously failed Eliquis as an outpatient now on Arixtra. Due to her renal dysfunction Arixtra was discontinued. Would recommend Coumadin and Lovenox. Previous physician expressed oncology that due to her renal dysfunction Arixtra is not recommended. Neurology and infectious disease if no contraindication to anticoagulation. Will start her on IV heparin no bolus.  Depression: With sertraline at bedtime seen by psych.  Abnormal x-ray of the lung: Currently asymptomatic seen on previous x-ray continue to monitor.  Anemia of chronic disease: Response to units of packed red blood cells, hemoglobin this morning is 10.8. No signs of overt bleeding.  Diabetes mellitus type 2 with peripheral neuropathy: Due to poor oral intake hypoglycemic agents were held, especially in the setting of worsening renal dysfunction.  Essential hypertension: Labetalol IV as needed not able to take orals. When she is more awake and able to take orals can resume Coreg.  Endometrial cancer stage IV: To resume outpatient chemotherapy   DVT prophylaxis: none Family Communication:none Status is: Inpatient Remains inpatient appropriate because: Acute CVA Pseudomonas  bacteremia    Code Status:     Code Status Orders  (From admission, onward)           Start  Ordered   11/28/22 1537  Full code  Continuous       Question:  By:  Answer:  Consent: discussion documented in EHR   11/28/22 1547           Code Status History     Date Active Date Inactive Code Status Order ID Comments User Context   09/01/2021 2140 09/05/2021 1404 Full Code [id number]  Synetta Fail, MD Inpatient         IV Access:   Peripheral IV   Procedures and diagnostic studies:   No results found.   Medical Consultants:   None.   Subjective:    Jaime Gomez no complaints  Objective:    Vitals:   12/06/22 0606 12/06/22 1331 12/06/22 2106 12/07/22 0408  BP: (!) 162/105 (!) 177/100 (!) 165/95 (!) 162/98  Pulse: 97 (!) 116 (!) 106 98  Resp:  16 20 20   Temp:  (!) 97.3 F (36.3 C) (!) 97.5 F (36.4 C) 97.9 F (36.6 C)  TempSrc:  Axillary Axillary Axillary  SpO2: 97% 99% 100% 97%  Weight:      Height:       SpO2: 97 %   Intake/Output Summary (Last 24 hours) at 12/07/2022 0815 Last data filed at 12/07/2022 0756 Gross per 24 hour  Intake 2096.56 ml  Output 1800 ml  Net 296.56 ml    Filed Weights   11/28/22 0819  Weight: 60.8 kg    Exam: General exam: In no acute distress. Respiratory system: Good air movement and clear to auscultation. Cardiovascular system: S1 & S2 heard, RRR. No JVD. Gastrointestinal system: Abdomen is nondistended, soft and nontender.  Extremities: No pedal edema. Skin: No rashes, lesions or ulcers Psychiatry: Judgment and insight and medical condition, labile emotion.  Data Reviewed:    Labs: Basic Metabolic Panel: Recent Labs  Lab 12/02/22 0416 12/03/22 0255 12/04/22 0233 12/05/22 0637 12/06/22 0359  NA 137 132* 139 143 146*  K 3.4* 3.5 3.4* 3.7 3.5  CL 109 116* 110 113* 113*  CO2 14* 13* 15* 16* 20*  GLUCOSE 59* 98 203* 140* 113*  BUN 80* 71* 73* 64* 63*  CREATININE 4.32* 3.85* 3.65*  3.48* 3.19*  CALCIUM 8.0* 7.4* 8.2* 8.8* 9.0  MG 2.1 2.0 1.8 1.8 1.8    GFR Estimated Creatinine Clearance: 17.3 mL/min (A) (by C-G formula based on SCr of 3.19 mg/dL (H)). Liver Function Tests: No results for input(s): "AST", "ALT", "ALKPHOS", "BILITOT", "PROT", "ALBUMIN" in the last 168 hours. No results for input(s): "LIPASE", "AMYLASE" in the last 168 hours. Recent Labs  Lab 12/03/22 1335  AMMONIA 17    Coagulation profile Recent Labs  Lab 11/30/22 1204  INR 1.1    COVID-19 Labs  No results for input(s): "DDIMER", "FERRITIN", "LDH", "CRP" in the last 72 hours.  Lab Results  Component Value Date   SARSCOV2NAA NEGATIVE 11/28/2022   SARSCOV2NAA NEGATIVE 09/01/2021    CBC: Recent Labs  Lab 12/02/22 0416 12/03/22 0255 12/04/22 0233 12/05/22 0637 12/06/22 0359  WBC 5.9 4.7 4.1 5.5 4.7  NEUTROABS 4.6  --   --   --   --   HGB 9.8* 9.8* 9.9* 11.3* 10.8*  HCT 30.0* 30.6* 30.9* 34.9* 34.3*  MCV 87.5 90.0 90.6 89.5 91.0  PLT 195 181 187 208 186    Cardiac Enzymes: No results for input(s): "CKTOTAL", "CKMB", "CKMBINDEX", "TROPONINI" in the last 168 hours. BNP (last 3 results) No results for input(s): "PROBNP" in the last 8760 hours. CBG:  Recent Labs  Lab 12/06/22 0713 12/06/22 1118 12/06/22 1631 12/06/22 2117 12/07/22 0728  GLUCAP 168* 176* 185* 134* 149*    D-Dimer: No results for input(s): "DDIMER" in the last 72 hours. Hgb A1c: No results for input(s): "HGBA1C" in the last 72 hours. Lipid Profile: No results for input(s): "CHOL", "HDL", "LDLCALC", "TRIG", "CHOLHDL", "LDLDIRECT" in the last 72 hours.  Thyroid function studies: No results for input(s): "TSH", "T4TOTAL", "T3FREE", "THYROIDAB" in the last 72 hours.  Invalid input(s): "FREET3"  Anemia work up: No results for input(s): "VITAMINB12", "FOLATE", "FERRITIN", "TIBC", "IRON", "RETICCTPCT" in the last 72 hours.  Sepsis Labs: Recent Labs  Lab 12/03/22 0255 12/03/22 1335 12/04/22 0233  12/05/22 0637 12/06/22 0359  WBC 4.7  --  4.1 5.5 4.7  LATICACIDVEN  --  0.8  --   --   --     Microbiology Recent Results (from the past 240 hour(s))  Resp panel by RT-PCR (RSV, Flu A&B, Covid) Anterior Nasal Swab     Status: None   Collection Time: 11/28/22  8:44 AM   Specimen: Anterior Nasal Swab  Result Value Ref Range Status   SARS Coronavirus 2 by RT PCR NEGATIVE NEGATIVE Final    Comment: (NOTE) SARS-CoV-2 target nucleic acids are NOT DETECTED.  The SARS-CoV-2 RNA is generally detectable in upper respiratory specimens during the acute phase of infection. The lowest concentration of SARS-CoV-2 viral copies this assay can detect is 138 copies/mL. A negative result does not preclude SARS-Cov-2 infection and should not be used as the sole basis for treatment or other patient management decisions. A negative result may occur with  improper specimen collection/handling, submission of specimen other than nasopharyngeal swab, presence of viral mutation(s) within the areas targeted by this assay, and inadequate number of viral copies(<138 copies/mL). A negative result must be combined with clinical observations, patient history, and epidemiological information. The expected result is Negative.  Fact Sheet for Patients:  BloggerCourse.com  Fact Sheet for Healthcare Providers:  SeriousBroker.it  This test is no t yet approved or cleared by the [place] FDA and  has been authorized for detection and/or diagnosis of SARS-CoV-2 by FDA under an Emergency Use Authorization (EUA). This EUA will remain  in effect (meaning this test can be used) for the duration of the COVID-19 declaration under Section 564(b)(1) of the Act, 21 U.S.C.section 360bbb-3(b)(1), unless the authorization is terminated  or revoked sooner.       Influenza A by PCR NEGATIVE NEGATIVE Final   Influenza B by PCR NEGATIVE NEGATIVE Final    Comment:  (NOTE) The Xpert Xpress SARS-CoV-2/FLU/RSV plus assay is intended as an aid in the diagnosis of influenza from Nasopharyngeal swab specimens and should not be used as a sole basis for treatment. Nasal washings and aspirates are unacceptable for Xpert Xpress SARS-CoV-2/FLU/RSV testing.  Fact Sheet for Patients: BloggerCourse.com  Fact Sheet for Healthcare Providers: SeriousBroker.it  This test is not yet approved or cleared by the [place] FDA and has been authorized for detection and/or diagnosis of SARS-CoV-2 by FDA under an Emergency Use Authorization (EUA). This EUA will remain in effect (meaning this test can be used) for the duration of the COVID-19 declaration under Section 564(b)(1) of the Act, 21 U.S.C. section 360bbb-3(b)(1), unless the authorization is terminated or revoked.     Resp Syncytial Virus by PCR NEGATIVE NEGATIVE Final    Comment: (NOTE) Fact Sheet for Patients: BloggerCourse.com  Fact Sheet for Healthcare Providers: SeriousBroker.it  This test is not yet  approved or cleared by the [place] and has been authorized for detection and/or diagnosis of SARS-CoV-2 by FDA under an Emergency Use Authorization (EUA). This EUA will remain in effect (meaning this test can be used) for the duration of the COVID-19 declaration under Section 564(b)(1) of the Act, 21 U.S.C. section 360bbb-3(b)(1), unless the authorization is terminated or revoked.  Performed at [facility], [address] [place], [place] [address]   Blood Culture (routine x 2)     Status: Abnormal   Collection Time: 11/28/22  8:44 AM   Specimen: BLOOD  Result Value Ref Range Status   Specimen Description   Final    BLOOD BLOOD RIGHT FOREARM Performed at [facility], [address] [place], [place] [address]    Special Requests   Final    Blood Culture  results may not be optimal due to an excessive volume of blood received in culture bottles BOTTLES DRAWN AEROBIC AND ANAEROBIC Performed at [facility], [address] [place], [place] [address]    Culture  Setup Time   Final    GRAM NEGATIVE RODS ANAEROBIC BOTTLE ONLY CRITICAL RESULT CALLED TO, READ BACK BY AND VERIFIED WITH: L. Loletta Specter PHARMD, AT 1351 11/29/22 Renato Shin Performed at [facility], [address], [place], [place] [address]    Culture PSEUDOMONAS AERUGINOSA (A)  Final   Report Status 12/01/2022 FINAL  Final   Organism ID, Bacteria PSEUDOMONAS AERUGINOSA  Final      Susceptibility   Pseudomonas aeruginosa - MIC*    CEFTAZIDIME <=1 SENSITIVE Sensitive     CIPROFLOXACIN 0.5 SENSITIVE Sensitive     GENTAMICIN <=1 SENSITIVE Sensitive     IMIPENEM <=0.25 SENSITIVE Sensitive     PIP/TAZO <=4 SENSITIVE Sensitive     CEFEPIME 2 SENSITIVE Sensitive     * PSEUDOMONAS AERUGINOSA  Urine Culture     Status: Abnormal   Collection Time: 11/28/22  8:44 AM   Specimen: Urine, Random  Result Value Ref Range Status   Specimen Description   Final    URINE, RANDOM Performed at [facility], [address] [place], [place] [address]    Special Requests   Final    NONE Reflexed from [id number] Performed at [facility], [address] [place], [place] [address]    Culture (A)  Final    >=100,000 COLONIES/mL LACTOBACILLUS SPECIES Standardized susceptibility testing for this organism is not available. Performed at [facility], [address] [place], [place] [address]    Report Status 11/29/2022 FINAL  Final  Blood Culture ID Panel (Reflexed)     Status: Abnormal   Collection Time: 11/28/22  8:44 AM  Result Value Ref Range Status   Enterococcus faecalis NOT DETECTED NOT DETECTED Final   Enterococcus Faecium NOT DETECTED NOT DETECTED Final   Listeria monocytogenes NOT DETECTED NOT DETECTED Final   Staphylococcus species NOT DETECTED  NOT DETECTED Final   Staphylococcus aureus (BCID) NOT DETECTED NOT DETECTED Final   Staphylococcus epidermidis NOT DETECTED NOT DETECTED Final   Staphylococcus lugdunensis NOT DETECTED NOT DETECTED Final   Streptococcus species NOT DETECTED NOT DETECTED Final   Streptococcus agalactiae NOT DETECTED NOT DETECTED Final   Streptococcus pneumoniae NOT DETECTED NOT DETECTED Final   Streptococcus pyogenes NOT DETECTED NOT DETECTED Final   A.calcoaceticus-baumannii NOT DETECTED NOT DETECTED Final   Bacteroides fragilis NOT DETECTED NOT DETECTED Final  Enterobacterales NOT DETECTED NOT DETECTED Final   Enterobacter cloacae complex NOT DETECTED NOT DETECTED Final   Escherichia coli NOT DETECTED NOT DETECTED Final   Klebsiella aerogenes NOT DETECTED NOT DETECTED Final   Klebsiella oxytoca NOT DETECTED NOT DETECTED Final   Klebsiella pneumoniae NOT DETECTED NOT DETECTED Final   Proteus species NOT DETECTED NOT DETECTED Final   Salmonella species NOT DETECTED NOT DETECTED Final   Serratia marcescens NOT DETECTED NOT DETECTED Final   Haemophilus influenzae NOT DETECTED NOT DETECTED Final   Neisseria meningitidis NOT DETECTED NOT DETECTED Final   Pseudomonas aeruginosa DETECTED (A) NOT DETECTED Final    Comment: CRITICAL RESULT CALLED TO, READ BACK BY AND VERIFIED WITH: L. POINDEXTER PHARMD, AT 1351 11/29/22 D. VANHOOK    Stenotrophomonas maltophilia NOT DETECTED NOT DETECTED Final   Candida albicans NOT DETECTED NOT DETECTED Final   Candida auris NOT DETECTED NOT DETECTED Final   Candida glabrata NOT DETECTED NOT DETECTED Final   Candida krusei NOT DETECTED NOT DETECTED Final   Candida parapsilosis NOT DETECTED NOT DETECTED Final   Candida tropicalis NOT DETECTED NOT DETECTED Final   Cryptococcus neoformans/gattii NOT DETECTED NOT DETECTED Final   CTX-M ESBL NOT DETECTED NOT DETECTED Final   Carbapenem resistance IMP NOT DETECTED NOT DETECTED Final   Carbapenem resistance KPC NOT DETECTED NOT  DETECTED Final   Carbapenem resistance NDM NOT DETECTED NOT DETECTED Final   Carbapenem resistance VIM NOT DETECTED NOT DETECTED Final    Comment: Performed at [facility], [address] [place], [place] [address]  Blood Culture (routine x 2)     Status: None   Collection Time: 11/28/22  9:02 AM   Specimen: BLOOD  Result Value Ref Range Status   Specimen Description   Final    BLOOD RIGHT ANTECUBITAL Performed at [facility], [address] [place], [place] [address]    Special Requests   Final    Blood Culture adequate volume BOTTLES DRAWN AEROBIC AND ANAEROBIC Performed at [facility], [address], [place], [place] [address]    Culture   Final    NO GROWTH 5 DAYS Performed at [facility], [address], [place], [place] [address]    Report Status 12/03/2022 FINAL  Final  Culture, blood (Routine X 2) w Reflex to ID Panel     Status: None   Collection Time: 11/30/22  6:25 PM   Specimen: BLOOD RIGHT ARM  Result Value Ref Range Status   Specimen Description   Final    BLOOD RIGHT ARM Performed at [facility], [address], [place], [place] [address]    Special Requests   Final    BOTTLES DRAWN AEROBIC ONLY Blood Culture adequate volume Performed at [facility], [address], [place], [place] [address]    Culture   Final    NO GROWTH 5 DAYS Performed at [facility], [address], [place], [place] [address]    Report Status 12/05/2022 FINAL  Final  Culture, blood (Routine X 2) w Reflex to ID Panel     Status: None   Collection Time: 11/30/22  6:30 PM   Specimen: BLOOD LEFT HAND  Result Value Ref Range Status   Specimen Description   Final    BLOOD LEFT HAND Performed at [facility], [address] [place], [place] [address]    Special Requests   Final  BOTTLES DRAWN AEROBIC ONLY Blood Culture adequate volume Performed at [facility], [address],  [place], [place] [address]    Culture   Final    NO GROWTH 5 DAYS Performed at [facility], [address] [place], [place] [address]    Report Status 12/05/2022 FINAL  Final     Medications:    acetaminophen  1,000 mg Oral Once   carvedilol  12.5 mg Oral BID WC   Chlorhexidine Gluconate Cloth  6 each Topical Daily   feeding supplement  237 mL Oral TID BM   folic acid  1 mg Oral Q0600   gabapentin  100 mg Oral BID   insulin aspart  0-9 Units Subcutaneous TID WC   mirtazapine  7.5 mg Oral QHS   sodium bicarbonate  650 mg Oral TID   Continuous Infusions:  meropenem (MERREM) IV Stopped (12/06/22 2149)      LOS: 9 days   Marinda Elk  [facility]  12/07/2022, 8:15 AM

## 2022-12-07 NOTE — Progress Notes (Signed)
ANTICOAGULATION CONSULT NOTE   Pharmacy Consult for IV heparin Indication: history of DVT  Allergies  Allergen Reactions   Taxotere [Docetaxel] Swelling    Throat swelling    Patient Measurements: Height: 5\' 5"  (165.1 cm) Weight: 60.8 kg (134 lb) IBW/kg (Calculated) : 57 Heparin Dosing Weight: TBW  Vital Signs: Temp: 97.9 F (36.6 C) (05/16 0408) Temp Source: Axillary (05/16 0408) BP: 162/98 (05/16 0408) Pulse Rate: 96 (05/16 0826)  Labs: Recent Labs    12/05/22 0637 12/06/22 0359  HGB 11.3* 10.8*  HCT 34.9* 34.3*  PLT 208 186  CREATININE 3.48* 3.19*     Estimated Creatinine Clearance: 17.3 mL/min (A) (by C-G formula based on SCr of 3.19 mg/dL (H)).   Medications:  PTA Fondaparinux 7.5mg  SQ daily (contraindicated per drug labeling with CrCl chronically < 30 ml/min). Last dose per patient 5/7.  Assessment: Patient is an 57 y.o female admitted on 5/7 for sepsis secondary to urinary tract infection, Pseudomonas bacteremia and AKI on CKD. On 5/8 nephrostomy exchanged by IR. 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   Pharmacy consulted for IV heparin on 5/9.   On 5/13, Heparin off for MRI then placed on hold.   Today, 5/16, Pharmacy re-consulted for IV heparin dosing.   Today, 12/07/22 Hgb 10.8, low but stable; Plt improved to WNL SCr = 3.19, CrCl ~ 17 ml/min  Goal of Therapy:  Heparin level 0.3-0.5 units/ml Monitor platelets by anticoagulation protocol: Yes   Plan:  No heparin bolus and Heparin Level goal of 0.3-0.5 per Attending Restart heparin infusion at 750 units/hr  Then obtain an 8 hour heparin level Monitor daily heparin level, CBC, signs/symptoms of bleeding F/u plans for long-term anticoagulation (fondaparinux contraindicated per manufacturer labeling with CrCl < 30 ml/min)   Thank you for allowing pharmacy to be a part of this patient's care.  Selinda Eon, PharmD, BCPS Clinical Pharmacist [place] Please  utilize Amion for appropriate phone number to reach the unit pharmacist [facility]) 12/07/2022 8:39 AM

## 2022-12-07 NOTE — Progress Notes (Addendum)
Speech Language Pathology Treatment: Dysphagia  Patient Details Name: Jaime Gomez MRN: [id number] DOB: 09/28/1965 Today's Date: 12/07/2022 Time: 4098-1191 SLP Time Calculation (min) (ACUTE ONLY): 15 min  Assessment / Plan / Recommendation Clinical Impression  Patient seen for po trials, diagnostic treatment. Patient alert and verbal, tearful, verbalizing anxiety and stating "please dont leave me." She was able to consume trials of thin liquids, pureed solids, and regular texture solids without overt indication of dysphagia or aspiration. She reported dislike of taste of all solids but otherwise has a functional oropharyngeal swallow. Will advance diet to regular solids with thin liquids. No f/u indicated for swallow. Did note expressive aphasia and confusion while in room with patient. MD present and provided order for cognitive linguistic evaluation.    HPI HPI: Jaime Gomez is a 57 y.o. 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 Cardiology consulted on 12/05/2022 for new CHF at the request of Dr. Nelson Chimes.  Pt with AMS suddenly and MRI showed subacute left frontoparietal infarcts with  additional punctate acute infarcts in the cerebellum and bilateral occipital lobes. CXR concerning for potential left pna.     Swallow eval ordered.  Sister, [name], reports pt has no prior history of dysphagia.      SLP Plan  Other (Comment) (no f/u for swallow)      Recommendations for follow up therapy are one component of a multi-disciplinary discharge planning process, led by the attending physician.  Recommendations may be updated based on patient status, additional functional criteria and insurance authorization.     Ferdinand Lango MA, CCC-SLP  Nataliya Graig  [place]  12/07/2022, 1:36 PM

## 2022-12-07 NOTE — Progress Notes (Addendum)
Rounding Note    Patient Name: Jaime Gomez Date of Encounter: 12/07/2022  [facility] Cardiologist: Thurmon Fair, MD (New)  Subjective   No acute overnight events. Patient is much more alert this morning. She is sitting up and watching TV. She is able to tell me her name and is able to answer questions with a yes and a no. However, she is still very confused and tearful when having trouble answering a question. She denies any chest pain or shortness of breath.  Inpatient Medications    Scheduled Meds:  acetaminophen  1,000 mg Oral Once   carvedilol  12.5 mg Oral BID WC   Chlorhexidine Gluconate Cloth  6 each Topical Daily   feeding supplement  237 mL Oral TID BM   folic acid  1 mg Oral Q0600   gabapentin  100 mg Oral BID   insulin aspart  0-9 Units Subcutaneous TID WC   isosorbide-hydrALAZINE  1 tablet Oral TID   melatonin  3 mg Oral QHS   mirtazapine  7.5 mg Oral QHS   oxybutynin  10 mg Oral Daily   sodium bicarbonate  650 mg Oral TID   Continuous Infusions:  heparin     meropenem (MERREM) IV Stopped (12/06/22 2149)   PRN Meds: acetaminophen **OR** acetaminophen, guaiFENesin, haloperidol lactate, hydrALAZINE, HYDROmorphone (DILAUDID) injection, ipratropium-albuterol, labetalol, LORazepam, methocarbamol, OLANZapine, ondansetron **OR** ondansetron (ZOFRAN) IV, oxyCODONE, polyethylene glycol, senna-docusate, sorbitol   Vital Signs    Vitals:   12/06/22 1331 12/06/22 2106 12/07/22 0408 12/07/22 0826  BP: (!) 177/100 (!) 165/95 (!) 162/98   Pulse: (!) 116 (!) 106 98 96  Resp: 16 20 20    Temp: (!) 97.3 F (36.3 C) (!) 97.5 F (36.4 C) 97.9 F (36.6 C)   TempSrc: Axillary Axillary Axillary   SpO2: 99% 100% 97%   Weight:      Height:        Intake/Output Summary (Last 24 hours) at 12/07/2022 0919 Last data filed at 12/07/2022 0853 Gross per 24 hour  Intake 2266.56 ml  Output 1500 ml  Net 766.56 ml      12/03/2022    7:07 PM 11/28/2022    8:19 AM  11/16/2022    3:10 PM  Last 3 Weights  Weight (lbs) 134 lb 134 lb 134 lb  Weight (kg) 60.782 kg 60.782 kg 60.782 kg      Telemetry    Sinus rhythm with resting heart rates in the 70s. Rates in the 90s to low 100s this morning but suspect this is due to the fact that patient gets tearful very easily when answering questions. - Personally Reviewed  ECG    No new ECG tracing today. - Personally Reviewed  Physical Exam   GEN: No acute distress.   Neck: No JVD. Cardiac: RRR. No murmurs, rubs, or gallops.  Respiratory: No increased work of breathing. Clear to auscultation bilaterally. No wheezes, rhonchi, or rales. GI: Soft, non-distended, and non-tender. MS: No lower extremity edema. No deformity. Skin: Warm and dry.  Neuro: Alert but still confused.  No focal deficits. Psych: Very tearful.  Labs    High Sensitivity Troponin:  No results for input(s): "TROPONINIHS" in the last 720 hours.   Chemistry Recent Labs  Lab 12/04/22 0233 12/05/22 0637 12/06/22 0359  NA 139 143 146*  K 3.4* 3.7 3.5  CL 110 113* 113*  CO2 15* 16* 20*  GLUCOSE 203* 140* 113*  BUN 73* 64* 63*  CREATININE 3.65* 3.48* 3.19*  CALCIUM 8.2* 8.8* 9.0  MG 1.8 1.8 1.8  GFRNONAA 14* 15* 16*  ANIONGAP 14 14 13     Lipids  Recent Labs  Lab 12/04/22 0232  CHOL 162  TRIG 199*  HDL 29*  LDLCALC 93  CHOLHDL 5.6    Hematology Recent Labs  Lab 12/04/22 0233 12/05/22 0637 12/06/22 0359  WBC 4.1 5.5 4.7  RBC 3.41* 3.90 3.77*  HGB 9.9* 11.3* 10.8*  HCT 30.9* 34.9* 34.3*  MCV 90.6 89.5 91.0  MCH 29.0 29.0 28.6  MCHC 32.0 32.4 31.5  RDW 16.0* 16.2* 16.4*  PLT 187 208 186   Thyroid  Recent Labs  Lab 12/03/22 1335  TSH 2.520    BNP Recent Labs  Lab 12/03/22 0255  BNP 2,665.1*    DDimer No results for input(s): "DDIMER" in the last 168 hours.   Radiology    No results found.  Cardiac Studies   Echocardiogram 12/04/2022: Impressions: 1. Global hypokinesis with apical akinesis;  overall moderate LV  dysfunction.   2. Left ventricular ejection fraction, by estimation, is 30 to 35%. The  left ventricle has moderately decreased function. The left ventricle  demonstrates regional wall motion abnormalities (see scoring  diagram/findings for description). The left  ventricular internal cavity size was mildly dilated. Left ventricular  diastolic parameters are consistent with Grade I diastolic dysfunction  (impaired relaxation).   3. Right ventricular systolic function is normal. The right ventricular  size is normal.   4. The mitral valve is normal in structure. Trivial mitral valve  regurgitation. No evidence of mitral stenosis.   5. The aortic valve is tricuspid. Aortic valve regurgitation is not  visualized. No aortic stenosis is present.   6. The inferior vena cava is normal in size with greater than 50%  respiratory variability, suggesting right atrial pressure of 3 mmHg.   Comparison(s): No prior Echocardiogram.   Patient Profile     57 y.o. 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 Hospitalization has been complicated AKI, metabolic encephalopathy, acute vs subactue CVA (possible septic emboli), and acute on chronic anemia requiring blood transfusion.  Cardiology was consulted on 12/05/2022 for further evaluation of new cardiomyopathy.   Assessment & Plan    New Diagnosed HFrEF Suspected Takotsubo Cardiomyopathy  Patient was admitted with sepsis secondary to UTI. Echo this admission showed LVEF of 30-35% with global hypokinesis and apical akinesis as well as grade 1 diastolic dysfunction.  Per Dr. Erin Hearing review of Echo, EF looked closer to 40% and there was apical ballooning pattern strongly suggestive of  Takotubo syndrome. BNP elevated at 2,665. Chest x-ray on 12/03/2022 showed new hazy opacity in the left perihilar region which could represent developing pneumonia but no overt edema. - Euvolemic on exam. - No need for diuretics at this time. - GDMT is limited by renal function. She has also been unable to take PO medications intermittently due to altered mental status. However, she was able to take her medications this morning.  - Continue Coreg 12.5mg  twice daily.  - Primary team added Bidil 20-37.5mg  three time daily this morning.  - No ACEi/ARB/ARNI, MRA, or SGLT2 inhibitor right now given renal function.  - Etiology felt to like be stress-induced cardiomyopathy. Not a candidate for cardiac catheterization at this time given severe CKD  with superimposed AKI, recent sepsis, and ongoing altered mental status. Risk of cath not felt to be worth the additional information it would give [place]. Recommend repeat TTE early next week.  If regional wall motion abnormalities have resolved by then, would assume that the diagnosis of stress cardiomyopathy is correct and would not pursue further diagnostic testing.    Hypertension BP elevated with BP in the 160s-170s/90s-100s. - Continue medications for CHF as above.    Bacteremia Blood cultures positive for pseudomonas. Source unclear. Port-A-Cath was removed on 12/04/2022 for possible source. ID had concern for possible endocarditis given multiple embolic stroke on brain MRI although pseudomonas unlikely to cause endocarditis. However, this was felt to be unlikely by Neurology and Cardiology.  - No plans for TEE at this time. - Continue IV antibiotics per ID.   Otherwise, per primary team: - Sepsis secondary to UTI - Pseudomonas bacteremia - Metabolic encephalopathy: Felt to be multifactorial due to infection, uremia, new CVA, and cefepime neurotoxicity.  - Metabolic acidosis - Acute/ subacute CVA - AKI superimposed on CKD Stage IV: Creatinine peaked at  5.15 and continues to slowly improve (3.19 yesterday). Today's labs pending. - Chronic right lower extremity DVT: On Arixtra at home but this has been stopped given renal function. She was switched to IV Heparin but this was then stopped due to suspicion for septic emboli with high risk of hemorrhagic transforation of his CVA. - Anemia of chronic disease" S/p 2 unit of PRBCs. Hemoglobin stable at 10.8 yesterday. Today's labs pending. - Type 2 diabetes mellitus   For questions or updates, please contact [facility] Please consult [email or web address] for contact info under        Signed, Corrin Parker, PA-C  12/07/2022, 9:19 AM     I have seen and examined the patient along with Corrin Parker, PA-C .  I have reviewed the chart, notes and new data.  I agree with PA/NP's note.  Key new complaints: much more alert and interactive today, but easily upset/frustrated. Suggestion of expressive aphasia. Emotionally labile. No chest pain or dyspnea. Key examination changes: clear lungs, RRR, no murmurs Key new findings / data: no labs today  PLAN: Improving mental status. Possible mild aphasia. Eating. On HF meds to help EF recovery. Recheck echo on Monday, suspect reversible stress cardiomyopathy.Thurmon Fair, MD, [facility] [phone] 12/07/2022, 1:49 PM

## 2022-12-07 NOTE — Progress Notes (Signed)
Mobility Specialist - Progress Note   12/07/22 1441  Mobility  Activity Ambulated with assistance in hallway  Level of Assistance Contact guard assist, steadying assist  Assistive Device Front wheel walker  Distance Ambulated (ft) 350 ft  Activity Response Tolerated well  Mobility Referral Yes  $Mobility charge 1 Mobility  Mobility Specialist Stop Time (ACUTE ONLY) 0240   Pt received in bed and agreeable to mobility with help of NT. Pt required verbal cues for walker mobility. No complaints during session. Pt to EOB to visit with family after session with all needs met. Sitter in room.   [facility]

## 2022-12-07 NOTE — Evaluation (Signed)
Speech Language Pathology Evaluation Patient Details Name: Jaime Gomez MRN: [id number] DOB: 1966-02-09 Today's Date: 12/07/2022 Time: 7829-5621 SLP Time Calculation (min) (ACUTE ONLY): 33 min  Problem List:  Patient Active Problem List   Diagnosis Date Noted   Major depressive disorder, single episode, moderate (HCC) 12/02/2022   Sepsis secondary to UTI (HCC) 11/28/2022   Sepsis due to gram-negative UTI (HCC) 11/28/2022   Abnormality of lung on CXR 11/28/2022   Metabolic acidosis with normal anion gap and failure of bicarbonate regeneration 11/28/2022   IDA (iron deficiency anemia) 04/28/2022   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   Hyperkalemia 09/02/2021   Acute renal failure superimposed on stage 3a chronic kidney disease (HCC) 09/01/2021   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) 09/01/2021   Anemia due to antineoplastic chemotherapy (CODE) 09/01/2021   Chronic kidney disease, stage 3a (HCC) 09/01/2021   Essential (primary) hypertension 09/01/2021   Gastroparesis 09/01/2021   Personal history of other venous thrombosis and embolism 09/01/2021   Personal history of pulmonary embolism 09/01/2021   Type 2 diabetes mellitus without complications (HCC) 09/01/2021   Endometrial cancer, FIGO stage IVB (HCC) 08/18/2021   Goals of care, counseling/discussion 08/18/2021   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 failure 08/18/2021   Leg DVT (deep venous thromboembolism), chronic, right (HCC) 08/18/2021   Past Medical History:  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 failure 08/18/2021   Diabetes mellitus without complication (HCC)    Endometrial cancer, FIGO stage IVB (HCC) 08/18/2021   Goals of care, counseling/discussion 08/18/2021   History of kidney stones    Hypertension    Leg DVT (deep venous thromboembolism), chronic, right (HCC) 08/18/2021   Pneumonia    Past Surgical History:  Past Surgical History:  Procedure Laterality Date   ABDOMINAL  HYSTERECTOMY     CYSTOSCOPY W/ URETERAL STENT PLACEMENT Bilateral 09/01/2021   Procedure: CYSTOSCOPY WITH RETROGRADE PYELOGRAM/URETERAL STENT PLACEMENT;  Surgeon: Jannifer Hick, MD;  Location: [facility];  Service: Urology;  Laterality: Bilateral;   CYSTOSCOPY WITH STENT PLACEMENT Right 05/19/2022   Procedure: CYSTOSCOPY WITH STENT CHANGE;  Surgeon: Jannifer Hick, MD;  Location: [facility];  Service: Urology;  Laterality: Right;  ONLY NEEDS 30 MIN   HERNIA REPAIR     IR CONVERT LEFT NEPHROSTOMY TO NEPHROURETERAL CATH  11/02/2021   IR CV LINE INJECTION  08/23/2021   IR IMAGING GUIDED PORT INSERTION  11/14/2021   IR NEPHROSTOMY EXCHANGE LEFT  11/02/2021   IR NEPHROSTOMY EXCHANGE LEFT  01/20/2022   IR NEPHROSTOMY EXCHANGE LEFT  11/29/2022   IR NEPHROSTOMY PLACEMENT LEFT  09/03/2021   IR REMOVAL TUN ACCESS W/ PORT W/O FL MOD SED  11/14/2021   IR REMOVAL TUN ACCESS W/ PORT W/O FL MOD SED  12/04/2022   HPI:  Jaime Gomez is a 58 y.o. 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 Cardiology consulted on 12/05/2022 for new CHF at the request of Dr. Nelson Chimes.  Pt with AMS suddenly and MRI showed subacute left frontoparietal infarcts with  additional punctate acute infarcts in the cerebellum and bilateral occipital lobes. CXR concerning for potential left pna.     Swallow eval ordered.  Sister, [name], reports pt has no prior history of dysphagia.   Assessment / Plan / Recommendation Clinical Impression  Cognitive-linguistic evaluation complete. Patient presents with a moderate global aphasia impacting both expressive and receptive language skills. Difficult to determine extent of cognitive deficits given language deficits however patient with notable decreased sustained attention,  decreased orientation and memory, and decreased safety awareness requiring a sitter. Patient also tearful during session, repeating "please done leave me alone." She will benefit from SLP f/u to address cognitive linguistic deficits in the acute care setting.    SLP Assessment  SLP Recommendation/Assessment: Patient needs continued Speech Lanaguage Pathology Services SLP Visit Diagnosis: Cognitive communication deficit (R41.841)    Recommendations for follow up therapy are one component of a multi-disciplinary discharge planning process, led by the attending physician.  Recommendations may be updated based on patient status, additional functional criteria and insurance authorization.    Follow Up Recommendations  Outpatient SLP    Assistance Recommended at Discharge  Frequent or constant Supervision/Assistance     Frequency and Duration min 2x/week  2 weeks      SLP Evaluation Cognition  Overall Cognitive Status: Impaired/Different from baseline Arousal/Alertness: Awake/alert Orientation Level: Oriented to person;Disoriented to place;Disoriented to time;Disoriented to situation Attention: Sustained Sustained Attention: Impaired Sustained Attention Impairment: Verbal basic;Verbal complex Memory:  (TBD with improved language) Awareness: Impaired Awareness Impairment: Intellectual impairment Safety/Judgment: Impaired Comments: requires sitter for safety       Comprehension  Auditory Comprehension Overall Auditory Comprehension: Impaired Yes/No Questions: Impaired Complex Questions: 25-49% accurate Commands: Impaired Multistep Basic Commands: 25-49% accurate Conversation: Simple Interfering Components: Attention Visual Recognition/Discrimination Discrimination: Within Function Limits Reading Comprehension Reading Status: Impaired Word level: Impaired Interfering Components:  (unable to r/o degree of visual field cut based on the way that patient was moving head to read)     Expression Expression Primary Mode of Expression: Verbal Verbal Expression Overall Verbal Expression: Impaired Initiation: No impairment Automatic Speech: Name Level of Generative/Spontaneous Verbalization: Phrase Repetition: Impaired Level of Impairment: Sentence level Naming: Impairment Responsive: 0-25% accurate Confrontation: Impaired Convergent: 25-49% accurate Divergent: 25-49% accurate Verbal Errors: Perseveration;Neologisms;Not aware of errors Pragmatics: Impairment Impairments: Abnormal affect Interfering Components:  (possible anxiety) Written Expression Dominant Hand: Right Written Expression: Not tested   Oral / Motor  Oral Motor/Sensory Function Overall Oral Motor/Sensory Function: Within functional limits Motor Speech Overall Motor Speech: Appears within functional limits for tasks assessed           Ferdinand Lango MA, CCC-SLP  Caliah Kopke Meryl 12/07/2022, 1:52 PM

## 2022-12-07 NOTE — Progress Notes (Signed)
It was really nice to see Jaime Gomez awake today.  She is certainly quite emotional.  She knew who I was.  I am just happy that she is finally coming about.  I know she still has a long way to go.  I am still not sure exactly as to what happened but it seems to be a rare neurologic toxicity from the antibiotic.  She really needs to get some nutrition in.  Hopefully she will be able to swallow.  I know that speech pathology has been seeing her.  I know she is going need physical therapy.  She is on anticoagulation with heparin right now.  I guess the issue now is what to treat her with as an outpatient.  Her labs show a BUN of 50 creatinine 2.62.  Her blood sugar is 162.  Surprisingly, there is congestive heart failure.  She had a beta natruretic peptide which was I think over 2600.  I am sure that Cardiology is following her.  As far as her underlying malignancy is concerned, this really is not an issue right now.  I know that she has metastatic disease.  However, given what else is going on with her, it is hard to know if we will ever be able to treat her.  She does not appear to be in, pain.  She is incredibly emotional.  Hopefully this will improve over time.  Her blood pressure is still on the high side.  She clearly needs to have her blood pressure under better control.  We will continue to follow along.  I am just happy that she is finally awake.  Hopefully, her cognitive state will improve.  [name], MD  Jeri Modena 29:11

## 2022-12-07 NOTE — Progress Notes (Signed)
Pharmacy Antibiotic Note  Jaime Gomez is a 57 y.o. female admitted on 11/28/2022 with sepsis secondary to Pseudomonas aeruginosa bacteremia. Pharmacy has been consulted for Meropenem dosing.  Plan: Continue Meropenem 1g IV q12h Monitor renal function, clinical course, further ID recommendations and LOT   Height: 5\' 5"  (165.1 cm) Weight: 60.8 kg (134 lb) IBW/kg (Calculated) : 57  Temp (24hrs), Avg:97.6 F (36.4 C), Min:97.3 F (36.3 C), Max:97.9 F (36.6 C)  Recent Labs  Lab 12/02/22 0416 12/03/22 0255 12/03/22 1335 12/04/22 0233 12/05/22 0637 12/06/22 0359  WBC 5.9 4.7  --  4.1 5.5 4.7  CREATININE 4.32* 3.85*  --  3.65* 3.48* 3.19*  LATICACIDVEN  --   --  0.8  --   --   --      Estimated Creatinine Clearance: 17.3 mL/min (A) (by C-G formula based on SCr of 3.19 mg/dL (H)).    Allergies  Allergen Reactions   Taxotere [Docetaxel] Swelling    Throat swelling    Antimicrobials this admission: 5/7 Cefepime >> 5/13 5/13 Meropenem >>   Microbiology results: 5/7 BCx: 1/4 bottles Pseudomonas aeruginosa (pan-sensitive) 5/7 UCx: > 100K Lactobacillus species 5/9 BCx: ngtd   Thank you for allowing pharmacy to be a part of this patient's care.  Selinda Eon, PharmD, BCPS Clinical Pharmacist [place] Please utilize Amion for appropriate phone number to reach the unit pharmacist [facility]) 12/07/2022 8:56 AM

## 2022-12-07 NOTE — Progress Notes (Signed)
Mobility Specialist - Progress Note   12/07/22 1053  Mobility  Activity Stood at bedside;Dangled on edge of bed;Transferred to/from [facility]  Level of Assistance Minimal assist, patient does 75% or more  Assistive Device Front wheel walker  Distance Ambulated (ft) 2 ft  Activity Response Tolerated well  Mobility Referral Yes  $Mobility charge 1 Mobility  Mobility Specialist Start Time (ACUTE ONLY) 1041  Mobility Specialist Stop Time (ACUTE ONLY) 1052  Mobility Specialist Time Calculation (min) (ACUTE ONLY) 11 min   Pt received in bed and agreeable sit EOB. Once sitting after ~56min pt asked if she could stand for a little. Pt tolerated standing for ~33min. Once back in bed, pt requested to use BSC with help NT. No complaints during session. Pt to [facility] with all needs met. Sitter in room.    [facility]

## 2022-12-07 NOTE — Progress Notes (Signed)
ANTICOAGULATION CONSULT NOTE   Pharmacy Consult for IV heparin Indication: history of DVT  Allergies  Allergen Reactions   Taxotere [Docetaxel] Swelling    Throat swelling    Patient Measurements: Height: 5\' 5"  (165.1 cm) Weight: 60.8 kg (134 lb) IBW/kg (Calculated) : 57 Heparin Dosing Weight: TBW  Vital Signs: Temp: 97.4 F (36.3 C) (05/16 1330) Temp Source: Oral (05/16 1330) BP: 99/64 (05/16 1330) Pulse Rate: 92 (05/16 1330)  Labs: Recent Labs    12/05/22 0637 12/06/22 0359 12/07/22 1652 12/07/22 1814  HGB 11.3* 10.8*  --   --   HCT 34.9* 34.3*  --   --   PLT 208 186  --   --   HEPARINUNFRC  --   --   --  0.22*  CREATININE 3.48* 3.19* 2.62*  --      Estimated Creatinine Clearance: 21.1 mL/min (A) (by C-G formula based on SCr of 2.62 mg/dL (H)).   Medications:  PTA Fondaparinux 7.5mg  SQ daily (contraindicated per drug labeling with CrCl chronically < 30 ml/min). Last dose per patient 5/7.  Assessment: Patient is an 57 y.o female admitted on 5/7 for sepsis secondary to urinary tract infection, Pseudomonas bacteremia and AKI on CKD. On 5/8 nephrostomy exchanged by IR. 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   Pharmacy consulted for IV heparin on 5/9.   On 5/13, Heparin off for MRI then placed on hold.   Today, 5/16, Pharmacy re-consulted for IV heparin dosing.   Today, 12/07/22 Hgb 10.8, low but stable; Plt improved to WNL SCr = 3.19, CrCl ~ 17 ml/min HL is 0.22, subtherapeutic No line or bleeding issues per RN   Goal of Therapy:  Heparin level 0.3-0.5 units/ml Monitor platelets by anticoagulation protocol: Yes   Plan:  No heparin bolus and Heparin Level goal of 0.3-0.5 per Attending Increase  heparin infusion to 850 units/hr  Obtain an 8 hour heparin level Monitor daily heparin level, CBC, signs/symptoms of bleeding F/u plans for long-term anticoagulation (fondaparinux contraindicated per manufacturer labeling with CrCl < 30  ml/min)   Thank you for allowing pharmacy to be a part of this patient's care.   Adalberto Cole, PharmD, BCPS 12/07/2022 7:34 PM

## 2022-12-08 DIAGNOSIS — N390 Urinary tract infection, site not specified: Secondary | ICD-10-CM | POA: Diagnosis not present

## 2022-12-08 DIAGNOSIS — I509 Heart failure, unspecified: Secondary | ICD-10-CM | POA: Diagnosis not present

## 2022-12-08 DIAGNOSIS — G9341 Metabolic encephalopathy: Secondary | ICD-10-CM | POA: Diagnosis not present

## 2022-12-08 DIAGNOSIS — A419 Sepsis, unspecified organism: Secondary | ICD-10-CM | POA: Diagnosis not present

## 2022-12-08 DIAGNOSIS — I10 Essential (primary) hypertension: Secondary | ICD-10-CM | POA: Diagnosis not present

## 2022-12-08 DIAGNOSIS — I63423 Cerebral infarction due to embolism of bilateral anterior cerebral arteries: Secondary | ICD-10-CM | POA: Diagnosis not present

## 2022-12-08 LAB — CBC
HCT: 32.9 % — ABNORMAL LOW (ref 36.0–46.0)
Hemoglobin: 10.4 g/dL — ABNORMAL LOW (ref 12.0–15.0)
MCH: 28.8 pg (ref 26.0–34.0)
MCHC: 31.6 g/dL (ref 30.0–36.0)
MCV: 91.1 fL (ref 80.0–100.0)
Platelets: 148 10*3/uL — ABNORMAL LOW (ref 150–400)
RBC: 3.61 MIL/uL — ABNORMAL LOW (ref 3.87–5.11)
RDW: 16.5 % — ABNORMAL HIGH (ref 11.5–15.5)
WBC: 5.5 10*3/uL (ref 4.0–10.5)
nRBC: 0.4 % — ABNORMAL HIGH (ref 0.0–0.2)

## 2022-12-08 LAB — COMPREHENSIVE METABOLIC PANEL
ALT: 7 U/L (ref 0–44)
AST: 14 U/L — ABNORMAL LOW (ref 15–41)
Albumin: 2.3 g/dL — ABNORMAL LOW (ref 3.5–5.0)
Alkaline Phosphatase: 78 U/L (ref 38–126)
Anion gap: 9 (ref 5–15)
BUN: 48 mg/dL — ABNORMAL HIGH (ref 6–20)
CO2: 21 mmol/L — ABNORMAL LOW (ref 22–32)
Calcium: 8.4 mg/dL — ABNORMAL LOW (ref 8.9–10.3)
Chloride: 110 mmol/L (ref 98–111)
Creatinine, Ser: 2.67 mg/dL — ABNORMAL HIGH (ref 0.44–1.00)
GFR, Estimated: 20 mL/min — ABNORMAL LOW (ref >60)
Glucose, Bld: 166 mg/dL — ABNORMAL HIGH (ref 70–99)
Potassium: 3.7 mmol/L (ref 3.5–5.1)
Sodium: 140 mmol/L (ref 135–145)
Total Bilirubin: 0.5 mg/dL (ref 0.3–1.2)
Total Protein: 6.3 g/dL — ABNORMAL LOW (ref 6.5–8.1)

## 2022-12-08 LAB — GLUCOSE, CAPILLARY
Glucose-Capillary: 128 mg/dL — ABNORMAL HIGH (ref 70–99)
Glucose-Capillary: 138 mg/dL — ABNORMAL HIGH (ref 70–99)
Glucose-Capillary: 186 mg/dL — ABNORMAL HIGH (ref 70–99)
Glucose-Capillary: 182 mg/dL — ABNORMAL HIGH (ref 70–99)

## 2022-12-08 LAB — HEPARIN LEVEL (UNFRACTIONATED): Heparin Unfractionated: 0.24 IU/mL — ABNORMAL LOW (ref 0.30–0.70)

## 2022-12-08 MED ORDER — POTASSIUM CHLORIDE CRYS ER 20 MEQ PO TBCR
40.0000 meq | EXTENDED_RELEASE_TABLET | Freq: Two times a day (BID) | ORAL | Status: AC
  Administered 2022-12-08 (×2): 40 meq via ORAL
  Filled 2022-12-08 (×2): qty 2

## 2022-12-08 MED ORDER — WARFARIN - PHARMACIST DOSING INPATIENT: Freq: Every day | Status: DC

## 2022-12-08 MED ORDER — ENOXAPARIN SODIUM 60 MG/0.6ML IJ SOSY
60.0000 mg | PREFILLED_SYRINGE | INTRAMUSCULAR | Status: DC
  Administered 2022-12-08 – 2022-12-12 (×5): 60 mg via SUBCUTANEOUS
  Filled 2022-12-08 (×5): qty 0.6

## 2022-12-08 MED ORDER — WARFARIN SODIUM 6 MG PO TABS
6.0000 mg | ORAL_TABLET | Freq: Once | ORAL | Status: AC
  Administered 2022-12-08: 6 mg via ORAL
  Filled 2022-12-08: qty 1

## 2022-12-08 MED ORDER — BOOST / RESOURCE BREEZE PO LIQD CUSTOM
1.0000 | Freq: Two times a day (BID) | ORAL | Status: DC
  Administered 2022-12-08: 1 via ORAL

## 2022-12-08 NOTE — TOC Progression Note (Addendum)
Transition of Care [facility]) - Progression Note    Patient Details  Name: Jaime Gomez MRN: [id number] Date of Birth: October 07, 1965  [facility] of [facility]) CM/SW Contact  Howell Rucks, RN Phone Number: 12/08/2022, 11:47 AM  Clinical Narrative:  PASRR re-initiated, previously canceled due to pt's change in mental status, pt alert, participating in therapies. Will continue to follow.  -3:00pm spoke with pt's sister at bedside, still reviewing bed offers. Awaiting PASRR and insurance auth. Will continue to follow.     Expected Discharge Plan: Home/Self Care Barriers to Discharge: Continued Medical Work up  Expected Discharge Plan and Services   Discharge Planning Services: CM Consult                                           Social Determinants of Health (SDOH) Interventions SDOH Screenings   Food Insecurity: No Food Insecurity (11/28/2022)  Housing: Low Risk  (11/28/2022)  Transportation Needs: No Transportation Needs (11/28/2022)  Utilities: Not At Risk (11/28/2022)  Depression (PHQ2-9): Low Risk  (09/18/2022)  Financial Resource Strain: Low Risk  (06/27/2022)  Stress: No Stress Concern Present (06/27/2022)  Tobacco Use: Low Risk  (12/04/2022)    Readmission Risk Interventions    11/30/2022   10:29 AM  Readmission Risk Prevention Plan  Transportation Screening Complete  PCP or Specialist Appt within 3-5 Days Complete  HRI or Home Care Consult Complete  Social Work Consult for Recovery Care Planning/Counseling Complete  Palliative Care Screening Not Applicable  Medication Review Oceanographer) Complete

## 2022-12-08 NOTE — Progress Notes (Signed)
Nutrition Follow-up  DOCUMENTATION CODES:   Not applicable  INTERVENTION:  - Regular diet.  - Boost Breeze po BID, each supplement provides 250 kcal and 9 grams of protein - Encourage intake at all meals. - Monitor weight trends.   NUTRITION DIAGNOSIS:   Inadequate oral intake related to lethargy/confusion as evidenced by meal completion < 25%. *improving  GOAL:   Patient will meet greater than or equal to 90% of their needs *progressing  MONITOR:   PO intake, Weight trends, Labs  REASON FOR ASSESSMENT:   Consult Assessment of nutrition requirement/status  ASSESSMENT:   57 y.o. female with medical history significant of endometrial cancer (currently on oral chemotherapy), CKD , chronic iron deficiency anemia, HTN, DM II who was admitted for sepsis secondary to urinary tract infection.  Patient alert and oriented at time of visit, sitting in bedside chair with family members at bedside.  She reports a UBW of 141# and feels she has likely lost weight over the past 2 weeks due to infection and not eating well.  Per EMR, patient has had a 19# or 12.4% weight loss in 7 months, which is significant for the time frame.   Sister reports the patient was not eating well the week before she was admitted. During admit, patient became confused with metabolic encephalopathy. Failed SLP eval 5/14 but after resolution of confusion became better patient able to advance to a regular diet 5/16.  She reports not liking the hospital food but that her sister has been able to bring her in some food. Her appetite is decreased but she states it is improving. She does not like Ensure but agreeable to try Boost Breeze instead for a non-milky option.   Admit weight: 134# Current weight: 139# Patient experiencing edema which may be affecting current weight status.    Medications reviewed and include: Folic acid, Insulin, Remeron  Labs reviewed:  HA1C 8.3 Blood Glucose 128-168 x24  hours   Diet Order:   Diet Order             Diet regular Room service appropriate? Yes; Fluid consistency: Thin  Diet effective now                   EDUCATION NEEDS:  Not appropriate for education at this time  Skin:  Skin Assessment: Reviewed RN Assessment  Last BM:  5/14  Height:  Ht Readings from Last 1 Encounters:  11/28/22 5\' 5"  (1.651 m)   Weight:  Wt Readings from Last 1 Encounters:  12/08/22 63.4 kg    BMI:  Body mass index is 23.26 kg/m.  Estimated Nutritional Needs:  Kcal:  1700-1950 kcals Protein:  60-75 grams Fluid:  >/= 1.7L    Shelle Iron RD, LDN For contact information, refer to [facility].

## 2022-12-08 NOTE — Progress Notes (Signed)
[name] continues to improve neurologically.  She is almost back to her baseline.  She and I talked quite a bit this morning.  It was nice to have her back where she is more alert and oriented.  She is on heparin right now.  She does not complain of any pain.  She is deconditioned.  She clearly will need to have quite a bit of physical therapy and maybe some occupational therapy.  Hopefully, she will be able to eat.  Her nutritional state is probably on the low side.  She has had no fever.  There is been no bowel or bladder incontinence.  She does have the nephrostomy tubes.  There is no lab work back on her today.  I know that we still have to deal with the underlying malignancy.  However, she is just not any shape right now.  Again, she will probably need some outpatient physical therapy.  Maybe, she has to go to a rehab facility.  I would hate if this happened.  Her blood pressure is much better.  Vital signs are temperature of 98.6.  Pulse 78.  Blood pressure 103/67.  Her lungs are clear bilaterally.  She has good air movement bilaterally.  Cardiac exam regular rate and rhythm.  She has no murmurs.  Abdomen is soft.  Bowel sounds are present.  There is no guarding or rebound tenderness.  Extremity shows no clubbing, cyanosis or edema.  Neurological exam is nonfocal.  She is much less emotional.  She is more alert.  [name] is recovering from this incredibly rare neurotoxicity from Maxipime.  I am glad that she is improving.  I still do not see any issue with respect to infections.  She is on heparin.  At some point, we will have to figure out how to get IV access with her.  Again I think right now the big issue is make sure that she gets adequate caloric intake.  Clearly, she has had incredible care from everybody on 4 W.  I do appreciate everybody's efforts.  [name], MD  Psalm 40:13

## 2022-12-08 NOTE — Progress Notes (Signed)
Physical Therapy Treatment Patient Details Name: Jaime Gomez MRN: [id number] DOB: Jun 19, 1966 Today's Date: 12/08/2022   History of Present Illness Patient is a 57 year old female who presented on 5/7 with left side nephrostomy tube that was due to be replaced on 5/8. Patient was admitted with sepsis,and UTI. Patient was noted to have nephrostomy tube changed with anemia post op with 2 units of PRBCs. 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 Port a cath has been removed.  PMH:  endometrial cancer , CKD , chronic iron deficiency anemia, Hypertension, DM II, RLE DVT.    PT Comments    Pt received up in chair. Much more alert and awake this date compared to 2 days ago. Pt able to follow simple commands consistently and converse appropriately in conversation. Attempted strengthening exercises in sitting/standing, pt with 7/10 pain in Right lower leg medially. Also, tender to light palpation, Nursing notified. Pt completed gait training with MinA without device. Slight stagger noted due to weakness and c/o lightheadedness. Pt returned to room and assisted back to bed. Pt encouraged to rest even when company is present. Will continue to see acutely per POC.   Recommendations for follow up therapy are one component of a multi-disciplinary discharge planning process, led by the attending physician.  Recommendations may be updated based on patient status, additional functional criteria and insurance authorization.  Follow Up Recommendations       Assistance Recommended at Discharge Frequent or constant Supervision/Assistance  Patient can return home with the following A little help with walking and/or transfers;A little help with bathing/dressing/bathroom;Assist for transportation;Help with stairs or ramp for entrance    Equipment Recommendations  Rolling walker (2 wheels)    Recommendations for Other Services       Precautions / Restrictions Precautions Precautions: Fall Precaution Comments: Lt neph tube Restrictions Weight Bearing Restrictions: No     Mobility  Bed Mobility Overal bed mobility: Needs Assistance Bed Mobility: Sit to Supine     Supine to sit: Min assist, Mod assist (for LE's)     General bed mobility comments:  (Very fatiqued requiring increased assist)    Transfers Overall transfer level: Needs assistance Equipment used: None, 1 person hand held assist Transfers: Sit to/from Stand Sit to Stand: Min assist, Mod assist           General transfer comment:  (Min/ModA to stand from low chair)    Ambulation/Gait Ambulation/Gait assistance: Min assist Gait Distance (Feet): 75 Feet Assistive device: 1 person hand held assist Gait Pattern/deviations: Step-through pattern, Decreased step length - right, Decreased step length - left, Drifts right/left Gait velocity: decreased     General Gait Details:  (Pt c/o dizziness during gait requesting to return to room)   Stairs             Wheelchair Mobility    Modified Rankin (Stroke Patients Only)       Balance Overall balance assessment: Needs assistance Sitting-balance support: No upper extremity supported, Feet supported Sitting balance-Leahy Scale: Fair     Standing balance support: Single extremity supported, During functional activity Standing balance-Leahy Scale: Fair               High level balance activites: Backward walking High Level Balance Comments:  (MinA for balance)  Cognition Arousal/Alertness: Awake/alert Behavior During Therapy: WFL for tasks assessed/performed Overall Cognitive Status: Within Functional Limits for tasks assessed                                 General Comments: Much more alert and awake today. Conversing and answering  questions appropriately        Exercises      General Comments General comments (skin integrity, edema, etc.): Pt c/o significant fatigue after short distance gait with increased lightheadedness, requesting return to bed      Pertinent Vitals/Pain Pain Assessment Pain Assessment: 0-10 Pain Score: 7  Pain Location:  (Right medial lower leg) Pain Descriptors / Indicators: Aching, Discomfort, Grimacing Pain Intervention(s): Limited activity within patient's tolerance (RN notified)    Home Living                          Prior Function            PT Goals (current goals can now be found in the care plan section) Acute Rehab PT Goals Patient Stated Goal: Get better Progress towards PT goals: Progressing toward goals    Frequency    Min 1X/week      PT Plan      Co-evaluation              AM-PAC PT "6 Clicks" Mobility   Outcome Measure  Help needed turning from your back to your side while in a flat bed without using bedrails?: A Little Help needed moving from lying on your back to sitting on the side of a flat bed without using bedrails?: A Lot Help needed moving to and from a bed to a chair (including a wheelchair)?: A Lot Help needed standing up from a chair using your arms (e.g., wheelchair or bedside chair)?: A Lot Help needed to walk in hospital room?: A Lot Help needed climbing 3-5 steps with a railing? : A Lot 6 Click Score: 13    End of Session Equipment Utilized During Treatment: Gait belt Activity Tolerance: Patient limited by fatigue;Patient limited by pain Patient left: in bed;with call bell/phone within reach;with bed alarm set;with family/visitor present Nurse Communication: Mobility status;Other (comment) (c/o pain) PT Visit Diagnosis: Other abnormalities of gait and mobility (R26.89)     Time: 1610-9604 PT Time Calculation (min) (ACUTE ONLY): 24 min  Charges:  $Gait Training: 8-22 mins $Therapeutic Activity: 8-22 mins                     Zadie Cleverly, PTA  Jannet Askew 12/08/2022, 2:51 PM

## 2022-12-08 NOTE — Plan of Care (Signed)
  Problem: Health Behavior/Discharge Planning: Goal: Ability to manage health-related needs will improve Outcome: Progressing   Problem: Clinical Measurements: Goal: Ability to maintain clinical measurements within normal limits will improve Outcome: Progressing   Problem: Activity: Goal: Risk for activity intolerance will decrease Outcome: Progressing   Problem: Nutrition: Goal: Adequate nutrition will be maintained Outcome: Progressing   Problem: Coping: Goal: Level of anxiety will decrease Outcome: Progressing   Problem: Safety: Goal: Ability to remain free from injury will improve Outcome: Progressing   

## 2022-12-08 NOTE — Progress Notes (Signed)
Rounding Note    Patient Name: Jaime Gomez Date of Encounter: 12/08/2022  [facility] Cardiologist: Thurmon Fair, MD   Subjective   Remains more alert and interactive, but easily frustrated (component of expressive aphasi?) and emotionally labile. Denies dyspnea. Afebrile, normal WBC.  Inpatient Medications    Scheduled Meds:  acetaminophen  1,000 mg Oral Once   carvedilol  12.5 mg Oral BID WC   Chlorhexidine Gluconate Cloth  6 each Topical Daily   enoxaparin (LOVENOX) injection  60 mg Subcutaneous Q24H   feeding supplement  1 Container Oral BID BM   folic acid  1 mg Oral Q0600   gabapentin  100 mg Oral BID   insulin aspart  0-9 Units Subcutaneous TID WC   isosorbide-hydrALAZINE  1 tablet Oral TID   melatonin  3 mg Oral QHS   mirtazapine  7.5 mg Oral QHS   oxybutynin  10 mg Oral Daily   potassium chloride  40 mEq Oral BID   sodium bicarbonate  650 mg Oral TID   warfarin  6 mg Oral ONCE-1600   Warfarin - Pharmacist Dosing Inpatient   Does not apply q1600   Continuous Infusions:  meropenem (MERREM) IV 1 g (12/08/22 0955)   PRN Meds: acetaminophen **OR** acetaminophen, guaiFENesin, haloperidol lactate, hydrALAZINE, HYDROmorphone (DILAUDID) injection, ipratropium-albuterol, labetalol, LORazepam, methocarbamol, OLANZapine, ondansetron **OR** ondansetron (ZOFRAN) IV, oxyCODONE, polyethylene glycol, senna-docusate, sorbitol   Vital Signs    Vitals:   12/07/22 2044 12/08/22 0442 12/08/22 0957 12/08/22 1249  BP: (!) 100/58 103/67  106/63  Pulse: 76 78  70  Resp: 20 20  18   Temp: (!) 97.5 F (36.4 C) 98.6 F (37 C)  98 F (36.7 C)  TempSrc: Oral Oral  Oral  SpO2: 96% 95%  98%  Weight:   63.4 kg   Height:        Intake/Output Summary (Last 24 hours) at 12/08/2022 1348 Last data filed at 12/08/2022 7829 Gross per 24 hour  Intake 890.46 ml  Output 150 ml  Net 740.46 ml      12/08/2022    9:57 AM 12/03/2022    7:07 PM 11/28/2022    8:19 AM  Last 3  Weights  Weight (lbs) 139 lb 12.8 oz 134 lb 134 lb  Weight (kg) 63.413 kg 60.782 kg 60.782 kg      Telemetry    NSR - Personally Reviewed  ECG    No new tracing - Personally Reviewed  Physical Exam   GEN: No acute distress.   Neck: No JVD Cardiac: RRR, no murmurs, rubs, or gallops.  Respiratory: Clear to auscultation bilaterally. GI: Soft, nontender, non-distended  MS: No edema; No deformity. Neuro:  Nonfocal  Psych: Normal affect   Labs    High Sensitivity Troponin:  No results for input(s): "TROPONINIHS" in the last 720 hours.   Chemistry Recent Labs  Lab 12/04/22 0233 12/05/22 0637 12/06/22 0359 12/07/22 1652 12/08/22 0823  NA 139 143 146* 141 140  K 3.4* 3.7 3.5 3.0* 3.7  CL 110 113* 113* 108 110  CO2 15* 16* 20* 19* 21*  GLUCOSE 203* 140* 113* 162* 166*  BUN 73* 64* 63* 50* 48*  CREATININE 3.65* 3.48* 3.19* 2.62* 2.67*  CALCIUM 8.2* 8.8* 9.0 8.3* 8.4*  MG 1.8 1.8 1.8  --   --   PROT  --   --   --   --  6.3*  ALBUMIN  --   --   --   --  2.3*  AST  --   --   --   --  14*  ALT  --   --   --   --  7  ALKPHOS  --   --   --   --  78  BILITOT  --   --   --   --  0.5  GFRNONAA 14* 15* 16* 21* 20*  ANIONGAP 14 14 13 14 9     Lipids  Recent Labs  Lab 12/04/22 0232  CHOL 162  TRIG 199*  HDL 29*  LDLCALC 93  CHOLHDL 5.6    Hematology Recent Labs  Lab 12/05/22 0637 12/06/22 0359 12/08/22 0823  WBC 5.5 4.7 5.5  RBC 3.90 3.77* 3.61*  HGB 11.3* 10.8* 10.4*  HCT 34.9* 34.3* 32.9*  MCV 89.5 91.0 91.1  MCH 29.0 28.6 28.8  MCHC 32.4 31.5 31.6  RDW 16.2* 16.4* 16.5*  PLT 208 186 148*   Thyroid  Recent Labs  Lab 12/03/22 1335  TSH 2.520    BNP Recent Labs  Lab 12/03/22 0255  BNP 2,665.1*    DDimer No results for input(s): "DDIMER" in the last 168 hours.   Radiology    No results found.  Cardiac Studies   chocardiogram 12/04/2022: Impressions: 1. Global hypokinesis with apical akinesis; overall moderate LV  dysfunction.   2. Left  ventricular ejection fraction, by estimation, is 30 to 35%. The  left ventricle has moderately decreased function. The left ventricle  demonstrates regional wall motion abnormalities (see scoring  diagram/findings for description). The left  ventricular internal cavity size was mildly dilated. Left ventricular  diastolic parameters are consistent with Grade I diastolic dysfunction  (impaired relaxation).   3. Right ventricular systolic function is normal. The right ventricular  size is normal.   4. The mitral valve is normal in structure. Trivial mitral valve  regurgitation. No evidence of mitral stenosis.   5. The aortic valve is tricuspid. Aortic valve regurgitation is not  visualized. No aortic stenosis is present.   6. The inferior vena cava is normal in size with greater than 50%  respiratory variability, suggesting right atrial pressure of 3 mmHg.   Comparison(s): No prior Echocardiogram.   Patient Profile     57 y.o. 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 Hospitalization has been complicated by AKI, cephalosporin-related encephalopathy, acute vs subacute CVA, and acute on chronic anemia requiring blood transfusion. Cardiology was consulted on 12/05/2022 for further evaluation of new cardiomyopathy.   Assessment & Plan    Mental status continues to improve. It seems that most of her neuro issues were metabolic/toxic rather than due to stroke. No signs of active infection. Repeat blood cultures negative. Very low index of suspicion for endocarditis. Repeat echo on Monday to assess for recovery of LV function from suspected takotsubo sd. BP and renal dysfunction precludes escalation of meds for CHF.  Please call  Cardiology over the weekend if there are new issues. Otherwise will  follow up after the Monday echo with further recommendations.     For questions or updates, please contact [facility] Please consult [email or web address] for contact info under        Signed, Thurmon Fair, MD  12/08/2022, 1:48 PM

## 2022-12-08 NOTE — Progress Notes (Addendum)
ANTICOAGULATION CONSULT NOTE   Pharmacy Consult for Lovenox and warfarin Indication: history of DVT  Allergies  Allergen Reactions   Taxotere [Docetaxel] Swelling    Throat swelling    Patient Measurements: Height: 5\' 5"  (165.1 cm) Weight: 60.8 kg (134 lb) IBW/kg (Calculated) : 57 Heparin Dosing Weight: TBW  Vital Signs: Temp: 98.6 F (37 C) (05/17 0442) Temp Source: Oral (05/17 0442) BP: 103/67 (05/17 0442) Pulse Rate: 78 (05/17 0442)  Labs: Recent Labs    12/06/22 0359 12/07/22 1652 12/07/22 1814 12/08/22 0823  HGB 10.8*  --   --  10.4*  HCT 34.3*  --   --  32.9*  PLT 186  --   --  148*  HEPARINUNFRC  --   --  0.22*  --   CREATININE 3.19* 2.62*  --   --      Estimated Creatinine Clearance: 21.1 mL/min (A) (by C-G formula based on SCr of 2.62 mg/dL (H)).   Medications:  PTA Fondaparinux 7.5mg  SQ daily (contraindicated per drug labeling with CrCl chronically < 30 ml/min). Last dose per patient 5/7.  Assessment: Patient is an 57 y.o female admitted on 5/7 for sepsis secondary to urinary tract infection, Pseudomonas bacteremia and AKI on CKD. On 5/8 nephrostomy exchanged by IR. She was on Fondaparinux PTA for chronic DVT in setting of metastatic endometrial cancer.  Today, 12/08/22 Patient is currently on IV heparin infusion.  Due to concerns over attempting to resume fondaparinux in setting of severe CKD (CrCl currently 21 mL/min), attending MD from hospitalist service reports that hematology has recommended transitioning to warfarin with Lovenox overlap.  Baseline INR 1.1 on 11/30/22. No major drug interactions with warfarin on current profile. On regular diet.  Charted as taking small amounts.  Using the warfarin dose initiation algorithm from [facility] (warfarindosing.org) with data for this patient produces an estimated warfarin maintenance dosage of 4.5 mg daily.   This is only an estimation; close INR monitoring will be required  during warfarin initiation and maintenance.  Goal of Therapy:  INR 2 - 3  Plan:  Discontinue IV heparin and associated laboratory monitoring Begin Lovenox 1 mg/kg (60 mg) [place] q24h   (dosage reduced due to CrCl < 30 mL/min) Warfarin 6 mg PO today at 4 pm. Daily PT/INR Monitor for signs of bleeding.  Thank you for allowing pharmacy to be a part of this patient's care.  Elie Goody, PharmD, BCPS Clinical Pharmacist 12/08/2022  10:10 AM

## 2022-12-08 NOTE — Progress Notes (Signed)
Occupational Therapy Treatment Patient Details Name: Jaime Gomez MRN: [id number] DOB: 06/23/1966 Today's Date: 12/08/2022   History of present illness Patient is a 57 year old female who presented on 5/7 with left side nephrostomy tube that was due to be replaced on 5/8. Patient was admitted with sepsis,and UTI. Patient was noted to have nephrostomy tube changed with anemia post op with 2 units of PRBCs. 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 Port a cath has been removed.  PMH:  endometrial cancer , CKD , chronic iron deficiency anemia, Hypertension, DM II, RLE DVT.   OT comments  The pt was motivated to participate in the session. She required increased time and effort for lower body dressing seated EOB. She was noted to need occasional prompts for recall, as well as for problem-solving during the session. She was also noted to occasionally be inattentive to environmental obstacles during out of bed activity, requiring assistance and cues to avoid bumping into obstacles. She will continue to benefit from OT services to maximize her safety and independence with self-care tasks.    Recommendations for follow up therapy are one component of a multi-disciplinary discharge planning process, led by the attending physician.  Recommendations may be updated based on patient status, additional functional criteria and insurance authorization.    Assistance Recommended at Discharge Frequent or constant Supervision/Assistance  Patient can return home with the following  Direct supervision/assist for medications management;A lot of help with bathing/dressing/bathroom;A lot of help with walking and/or transfers;Assistance with cooking/housework   Equipment Recommendations  None recommended by OT       Precautions /  Restrictions Precautions Precautions: Fall Precaution Comments: Lt neph tube Restrictions Weight Bearing Restrictions: No       Mobility Bed Mobility Overal bed mobility: Needs Assistance Bed Mobility: Supine to Sit     Supine to sit: Mod assist, HOB elevated     General bed mobility comments: She required assist for BLE advancement forward off the bed    Transfers Overall transfer level: Needs assistance Equipment used: Rolling walker (2 wheels) Transfers: Sit to/from Stand Sit to Stand: Min assist                     ADL either performed or assessed with clinical judgement   ADL Overall ADL's : Needs assistance/impaired Eating/Feeding: Set up;Sitting Eating/Feeding Details (indicate cue type and reason): based on clinical judgement Grooming: Minimal assistance;Sitting           Upper Body Dressing : Minimal assistance;Moderate assistance;Sitting   Lower Body Dressing: Moderate assistance Lower Body Dressing Details (indicate cue type and reason): She required increased time and effort, as well as assistance with sock management seated EOB.                     Cognition Arousal/Alertness: Awake/alert Behavior During Therapy: WFL for tasks assessed/performed        Safety/Judgement: Decreased awareness of deficits     General Comments: Follows 1 step commands with occasional repetition, required occasional cues for safety, cooperative              General Comments Pt c/o significant fatigue after short distance gait with increased lightheadedness, requesting return to bed    Pertinent Vitals/ Pain       Pain Assessment Pain Assessment:  No/denies pain         Frequency  Min 1X/week        Progress Toward Goals  OT Goals(current goals can now be found in the care plan section)  Progress towards OT goals: Progressing toward goals  Acute Rehab OT Goals Patient Stated Goal: not particularly stated OT Goal Formulation: With  patient Time For Goal Achievement: 12/17/22 Potential to Achieve Goals: Good  Plan Discharge plan remains appropriate       AM-PAC OT "6 Clicks" Daily Activity     Outcome Measure   Help from another person eating meals?: A Little Help from another person taking care of personal grooming?: A Little Help from another person toileting, which includes using toliet, bedpan, or urinal?: A Lot Help from another person bathing (including washing, rinsing, drying)?: A Lot Help from another person to put on and taking off regular upper body clothing?: A Lot Help from another person to put on and taking off regular lower body clothing?: A Lot 6 Click Score: 14    End of Session Equipment Utilized During Treatment: Rolling walker (2 wheels);Gait belt  OT Visit Diagnosis: Unsteadiness on feet (R26.81);Other abnormalities of gait and mobility (R26.89)   Activity Tolerance Patient tolerated treatment well   Patient Left in bed;with call bell/phone within reach;with chair alarm set   Nurse Communication Mobility status        Time: 2956-2130 OT Time Calculation (min): 13 min  Charges: OT General Charges $OT Visit: 1 Visit OT Treatments $Therapeutic Activity: 8-22 mins     Reuben Likes, OTR/L 12/08/2022, 4:14 PM

## 2022-12-08 NOTE — Progress Notes (Signed)
[facility] Infectious Diseases Follow Up Note  Patient Identification: Patient Name: Jaime Gomez MRN: [id number] Admit Date: 11/28/2022  8:13 AM Age: 57 y.o.Today's Date: 12/08/2022  Reason for Visit: bacteremia   Principal Problem:   Sepsis secondary to UTI [facility]) Active Problems:   Endometrial cancer, FIGO stage IVB (HCC)   Leg DVT (deep venous thromboembolism), chronic, right (HCC)   Acute renal failure superimposed on stage 3a chronic kidney disease (HCC)   Essential (primary) hypertension   Type 2 diabetes mellitus without complications (HCC)   Abnormality of lung on CXR   Metabolic acidosis with normal anion gap and failure of bicarbonate regeneration   Major depressive disorder, single episode, moderate (HCC)  Antibiotics:  Cefepime 5/7-5/12 Meropenem 5/13-c   Lines/Hardware: Rt chest port ( removed 5/13), Left sided nephrostomy  Interval Events:  Remains afebrile Her mental status has improved  Assessment 57 year old female with h/o  DM, endometrial cancer on Femara and Afinitor ( follows Dr Myna Hidalgo) HTN, kidney stones chronic right leg DVT, anemia of CKD who presented to the ED on 5/7 with persistent fever, generalized bodyaches and weakness, flank pain and weakness  for few days. ID engaged for    # PsA bacteremia: Presumed to be related to Portacath although no external signs of infection ( removed 5/13). UA 5/7 with pyuria (? UTI). Left nephrostomy was exchanged 5/8. TTE 5/13 did not show vegetations. Repeat blood cx 5/9 NG in 5 days. Question about possible endocarditis with multiple CVAs, however felt unlikely by Cardiology as well as Neurology. Per Neurology CVA does not seems consistent with septic emboli.   # Acute encephalopathy( resolved) - 2/2 infection, new CVA, uremia, cefepime neurotoxicity. EEG 5/13 with no seizure activity seen. She is back to baseline. Unlikely related to septic emboli/endocarditis   #  New onset acute CHF - Cardiology consulted and thought to be Takotsubo cardiomyopathy, Plan for repeat echo next week   Recommendations Continue meropenem for now while IP.  Can do ciprofloxacin ( 750 mg po daily adjusted for renal function) for discharge. Although wide variety CNS side effects wil Fqs, but not common.  I think Ciprofloxacin should be OK for short term use  qtc 476 on 5/7. This will avoid use of PICC line. EOT 5/26 Monitor CBC and CMP on abtx  ID will so, please recall if needed   Rest of the management as per the primary team. Thank you for the consult. Please page with pertinent questions or concerns.  ______________________________________________________________________ Subjective patient seen and examined at the bedside. Sister at bedside. Patient is able to talk in full sentences and recognize me and remembers I was the doctor who told her to remove portacath   Vitals BP 103/67 (BP Location: Right Arm)   Pulse 78   Temp 98.6 F (37 C) (Oral)   Resp 20   Ht 5\' 5"  (1.651 m)   Wt 60.8 kg   SpO2 95%   BMI 22.30 kg/m     Physical Exam Constitutional:  adult female lying in the bed, comfortable    Comments: Pupils bilaterally symmetrical   Cardiovascular:     Rate and Rhythm: Normal rate and regular rhythm.     Heart sounds:  Pulmonary:     Effort: Pulmonary effort is normal.     Comments: Normal breath sounds   Abdominal:     Palpations: Abdomen is soft.     Tenderness: non distended   Musculoskeletal:        General: No swelling  or tenderness in peripheral joints.   Skin:    Comments: rt chest portacath removed site is bandaged , left nephrostomy with clear urine   Neurological:     General: awake, alert, oriented, follows commands   Pertinent Microbiology Results for orders placed or performed during the hospital encounter of 11/28/22  Resp panel by RT-PCR (RSV, Flu A&B, Covid) Anterior Nasal Swab     Status: None   Collection Time: 11/28/22   8:44 AM   Specimen: Anterior Nasal Swab  Result Value Ref Range Status   SARS Coronavirus 2 by RT PCR NEGATIVE NEGATIVE Final    Comment: (NOTE) SARS-CoV-2 target nucleic acids are NOT DETECTED.  The SARS-CoV-2 RNA is generally detectable in upper respiratory specimens during the acute phase of infection. The lowest concentration of SARS-CoV-2 viral copies this assay can detect is 138 copies/mL. A negative result does not preclude SARS-Cov-2 infection and should not be used as the sole basis for treatment or other patient management decisions. A negative result may occur with  improper specimen collection/handling, submission of specimen other than nasopharyngeal swab, presence of viral mutation(s) within the areas targeted by this assay, and inadequate number of viral copies(<138 copies/mL). A negative result must be combined with clinical observations, patient history, and epidemiological information. The expected result is Negative.  Fact Sheet for Patients:  BloggerCourse.com  Fact Sheet for Healthcare Providers:  SeriousBroker.it  This test is no t yet approved or cleared by the [place] FDA and  has been authorized for detection and/or diagnosis of SARS-CoV-2 by FDA under an Emergency Use Authorization (EUA). This EUA will remain  in effect (meaning this test can be used) for the duration of the COVID-19 declaration under Section 564(b)(1) of the Act, 21 U.S.C.section 360bbb-3(b)(1), unless the authorization is terminated  or revoked sooner.       Influenza A by PCR NEGATIVE NEGATIVE Final   Influenza B by PCR NEGATIVE NEGATIVE Final    Comment: (NOTE) The Xpert Xpress SARS-CoV-2/FLU/RSV plus assay is intended as an aid in the diagnosis of influenza from Nasopharyngeal swab specimens and should not be used as a sole basis for treatment. Nasal washings and aspirates are unacceptable for Xpert Xpress  SARS-CoV-2/FLU/RSV testing.  Fact Sheet for Patients: BloggerCourse.com  Fact Sheet for Healthcare Providers: SeriousBroker.it  This test is not yet approved or cleared by the [place] FDA and has been authorized for detection and/or diagnosis of SARS-CoV-2 by FDA under an Emergency Use Authorization (EUA). This EUA will remain in effect (meaning this test can be used) for the duration of the COVID-19 declaration under Section 564(b)(1) of the Act, 21 U.S.C. section 360bbb-3(b)(1), unless the authorization is terminated or revoked.     Resp Syncytial Virus by PCR NEGATIVE NEGATIVE Final    Comment: (NOTE) Fact Sheet for Patients: BloggerCourse.com  Fact Sheet for Healthcare Providers: SeriousBroker.it  This test is not yet approved or cleared by the [place] FDA and has been authorized for detection and/or diagnosis of SARS-CoV-2 by FDA under an Emergency Use Authorization (EUA). This EUA will remain in effect (meaning this test can be used) for the duration of the COVID-19 declaration under Section 564(b)(1) of the Act, 21 U.S.C. section 360bbb-3(b)(1), unless the authorization is terminated or revoked.  Performed at [facility], [address] [place], [place] [address]   Blood Culture (routine x 2)     Status: Abnormal   Collection Time: 11/28/22  8:44 AM   Specimen: BLOOD  Result Value Ref Range Status   Specimen Description   Final    BLOOD BLOOD RIGHT FOREARM Performed at [facility], [address] [place], [place] [address]    Special Requests   Final    Blood Culture results may not be optimal due to an excessive volume of blood received in culture bottles BOTTLES DRAWN AEROBIC AND ANAEROBIC Performed at [facility], [address] [place], [place] [address]    Culture  Setup Time   Final    GRAM NEGATIVE  RODS ANAEROBIC BOTTLE ONLY CRITICAL RESULT CALLED TO, READ BACK BY AND VERIFIED WITH: L. Loletta Specter PHARMD, AT 1351 11/29/22 Renato Shin Performed at [facility], [address], [place], [place] [address]    Culture PSEUDOMONAS AERUGINOSA (A)  Final   Report Status 12/01/2022 FINAL  Final   Organism ID, Bacteria PSEUDOMONAS AERUGINOSA  Final      Susceptibility   Pseudomonas aeruginosa - MIC*    CEFTAZIDIME <=1 SENSITIVE Sensitive     CIPROFLOXACIN 0.5 SENSITIVE Sensitive     GENTAMICIN <=1 SENSITIVE Sensitive     IMIPENEM <=0.25 SENSITIVE Sensitive     PIP/TAZO <=4 SENSITIVE Sensitive     CEFEPIME 2 SENSITIVE Sensitive     * PSEUDOMONAS AERUGINOSA  Urine Culture     Status: Abnormal   Collection Time: 11/28/22  8:44 AM   Specimen: Urine, Random  Result Value Ref Range Status   Specimen Description   Final    URINE, RANDOM Performed at [facility], [address] [place], [place] [address]    Special Requests   Final    NONE Reflexed from [id number] Performed at [facility], [address] [place], [place] [address]    Culture (A)  Final    >=100,000 COLONIES/mL LACTOBACILLUS SPECIES Standardized susceptibility testing for this organism is not available. Performed at [facility], [address], [place], [place] [address]    Report Status 11/29/2022 FINAL  Final  Blood Culture ID Panel (Reflexed)     Status: Abnormal   Collection Time: 11/28/22  8:44 AM  Result Value Ref Range Status   Enterococcus faecalis NOT DETECTED NOT DETECTED Final   Enterococcus Faecium NOT DETECTED NOT DETECTED Final   Listeria monocytogenes NOT DETECTED NOT DETECTED Final   Staphylococcus species NOT DETECTED NOT DETECTED Final   Staphylococcus aureus (BCID) NOT DETECTED NOT DETECTED Final   Staphylococcus epidermidis NOT DETECTED NOT DETECTED Final   Staphylococcus lugdunensis NOT DETECTED NOT DETECTED Final   Streptococcus species NOT DETECTED NOT DETECTED  Final   Streptococcus agalactiae NOT DETECTED NOT DETECTED Final   Streptococcus pneumoniae NOT DETECTED NOT DETECTED Final   Streptococcus pyogenes NOT DETECTED NOT DETECTED Final   A.calcoaceticus-baumannii NOT DETECTED NOT DETECTED Final   Bacteroides fragilis NOT DETECTED NOT DETECTED Final   Enterobacterales NOT DETECTED NOT DETECTED Final   Enterobacter cloacae complex NOT DETECTED NOT DETECTED Final   Escherichia coli NOT DETECTED NOT DETECTED Final   Klebsiella aerogenes NOT DETECTED NOT DETECTED Final   Klebsiella oxytoca NOT DETECTED NOT DETECTED Final   Klebsiella pneumoniae NOT DETECTED NOT DETECTED Final   Proteus species NOT DETECTED NOT DETECTED Final   Salmonella species NOT DETECTED NOT DETECTED Final   Serratia marcescens NOT DETECTED NOT DETECTED Final   Haemophilus influenzae NOT DETECTED NOT DETECTED Final   Neisseria meningitidis NOT DETECTED NOT DETECTED Final   Pseudomonas aeruginosa DETECTED (  A) NOT DETECTED Final    Comment: CRITICAL RESULT CALLED TO, READ BACK BY AND VERIFIED WITH: L. POINDEXTER PHARMD, AT 1351 11/29/22 D. VANHOOK    Stenotrophomonas maltophilia NOT DETECTED NOT DETECTED Final   Candida albicans NOT DETECTED NOT DETECTED Final   Candida auris NOT DETECTED NOT DETECTED Final   Candida glabrata NOT DETECTED NOT DETECTED Final   Candida krusei NOT DETECTED NOT DETECTED Final   Candida parapsilosis NOT DETECTED NOT DETECTED Final   Candida tropicalis NOT DETECTED NOT DETECTED Final   Cryptococcus neoformans/gattii NOT DETECTED NOT DETECTED Final   CTX-M ESBL NOT DETECTED NOT DETECTED Final   Carbapenem resistance IMP NOT DETECTED NOT DETECTED Final   Carbapenem resistance KPC NOT DETECTED NOT DETECTED Final   Carbapenem resistance NDM NOT DETECTED NOT DETECTED Final   Carbapenem resistance VIM NOT DETECTED NOT DETECTED Final    Comment: Performed at [facility], [address], [place], [place] [address]  Blood Culture (routine x 2)      Status: None   Collection Time: 11/28/22  9:02 AM   Specimen: BLOOD  Result Value Ref Range Status   Specimen Description   Final    BLOOD RIGHT ANTECUBITAL Performed at [facility], [address] [place], [place] [address]    Special Requests   Final    Blood Culture adequate volume BOTTLES DRAWN AEROBIC AND ANAEROBIC Performed at [facility], [address], [place], [place] [address]    Culture   Final    NO GROWTH 5 DAYS Performed at [facility], [address], [place], [place] [address]    Report Status 12/03/2022 FINAL  Final  Culture, blood (Routine X 2) w Reflex to ID Panel     Status: None   Collection Time: 11/30/22  6:25 PM   Specimen: BLOOD RIGHT ARM  Result Value Ref Range Status   Specimen Description   Final    BLOOD RIGHT ARM Performed at [facility], [address], [place], [place] [address]    Special Requests   Final    BOTTLES DRAWN AEROBIC ONLY Blood Culture adequate volume Performed at [facility], [address] [place], [place] [address]    Culture   Final    NO GROWTH 5 DAYS Performed at [facility], [address], [place], [place] [address]    Report Status 12/05/2022 FINAL  Final  Culture, blood (Routine X 2) w Reflex to ID Panel     Status: None   Collection Time: 11/30/22  6:30 PM   Specimen: BLOOD LEFT HAND  Result Value Ref Range Status   Specimen Description   Final    BLOOD LEFT HAND Performed at [facility], [address] [place], [place] [address]    Special Requests   Final    BOTTLES DRAWN AEROBIC ONLY Blood Culture adequate volume Performed at [facility], [address], [place], [place] [address]    Culture   Final    NO GROWTH 5 DAYS Performed at [facility], [address] [place], [place] [address]    Report Status 12/05/2022 FINAL  Final   Pertinent Lab.    Latest Ref Rng & Units 12/06/2022    3:59 AM 12/05/2022     6:37 AM 12/04/2022    2:33 AM  CBC  WBC 4.0 - 10.5 K/uL 4.7  5.5  4.1  Hemoglobin 12.0 - 15.0 g/dL 13.0  86.5  9.9   Hematocrit 36.0 - 46.0 % 34.3  34.9  30.9   Platelets 150 - 400 K/uL 186  208  187       Latest Ref Rng & Units 12/07/2022    4:52 PM 12/06/2022    3:59 AM 12/05/2022    6:37 AM  CMP  Glucose 70 - 99 mg/dL 784  696  295   BUN 6 - 20 mg/dL 50  63  64   Creatinine 0.44 - 1.00 mg/dL 2.84  1.32  4.40   Sodium 135 - 145 mmol/L 141  146  143   Potassium 3.5 - 5.1 mmol/L 3.0  3.5  3.7   Chloride 98 - 111 mmol/L 108  113  113   CO2 22 - 32 mmol/L 19  20  16    Calcium 8.9 - 10.3 mg/dL 8.3  9.0  8.8      Pertinent Imaging today Plain films and CT images have been personally visualized and interpreted; radiology reports have been reviewed. Decision making incorporated into the Impression   No results found.  I have personally spent 37  minutes involved in face-to-face and non-face-to-face activities for this patient on the day of the visit. Professional time spent includes the following activities: Preparing to see the patient (review of tests), Obtaining and/or reviewing separately obtained history (admission/discharge record), Performing a medically appropriate examination and/or evaluation , Ordering medications/tests/procedures, referring and communicating with other health care professionals, Documenting clinical information in the EMR, Independently interpreting results (not separately reported), Communicating results to the patient/family/caregiver, Counseling and educating the patient/family/caregiver and Care coordination (not separately reported).   Plan d/w requesting provider as well as ID pharm D  Note: This document was prepared using dragon voice recognition software and may include unintentional dictation errors.   Electronically signed by:   Odette Fraction, MD Infectious Disease Physician [facility] Pager:  [phone]

## 2022-12-08 NOTE — Progress Notes (Signed)
[facility] PROGRESS NOTE    Progress Note  Jaime Gomez  QMV:[id number] DOB: October 06, 1965 DOA: 11/28/2022 PCP: Staci Acosta, FNP     Brief Narrative:   Jaime Gomez is an 57 y.o. female past medical history significant for endometrial cancer on oral chemotherapy, chronic kidney disease stage III, iron deficiency anemia, essential hypertension diabetes mellitus type 2 history of right lower extremity DVT while on Arixtra, will with a left-sided nephrostomy tube exchange on 11/29/2022 patient admitted for sepsis due to UTI IR consulted nephrostomy tube was placed.  She is status post 2 units of packed red blood cells Hospital course was complicated by acute kidney injury which is slowly improving and further complicated by multifocal CVA concern for embolic versus septic, neurology infectious disease were consulted.    Significant events: Status post percutaneous tube exchange on 11/29/2022. Port has been removed on 12/04/2022. Port-A-Cath removed on 12/04/2022. Transesophageal echo did not show vegetation 12/04/2022.   Assessment/Plan:   Sepsis secondary to UTI (HCC)/Pseudomonas bacteremia: Blood cultures positive for Pseudomonas. Currently on IV meropenem.  Duration of antibiotics per ID. Not a candidate for TEE. Surveillance blood cultures on 11/30/2022 have been negative till date. Has remained afebrile. Physical therapy evaluated the patient recommended skilled nursing facility.  Newly diagnosed HFrEF/Takotsubo cardiomyopathy : Cardiology was consulted and is concerned about stress cardiomyopathy/Takotsubo syndrome. Likely due to Pseudomonas sepsis. Repeat echocardiogram in 1 to 2 weeks to evaluate wall motion abnormality and EF. Continue Coreg and BiDil Ischemic workup Has been deferred for now.  Acute/subacute CVA/acute encephalopathy: MRI of the brain showed multiple infarcts. Neurology was consulted.  MRI showed no large vessel occlusions. EEG showed no seizures,  encephalopathy possibly due to cefepime toxicity. Mentation back to baseline family is confirmed.  Acute metabolic encephalopathy: Multifactorial likely due to cefepime toxicity. Cefepime has been discontinued. Neurology recommended no further workup. Her mentation is back to baseline, Family has confirmed  Acute kidney injury superimposed on chronic kidney disease stage IIIa: Baseline creatinine of 2.6 on admission 4.9. Treated conservatively her creatinine has returned to baseline. Renal ultrasound showed chronic renal disease. Swallowing evaluation  Hypomagnesemia/hypokalemia: Repeat check the potassium greater than 4 magnesium greater than 2.  Chronic right lower extremity DVT: Previously failed Eliquis as an outpatient now on Arixtra. Due to her renal dysfunction Arixtra was discontinued. Discussed with oncology will transition her to Lovenox and Coumadin overlap. Neurology and infectious disease if no contraindication to anticoagulation.  Depression: With sertraline at bedtime seen by psych.  Abnormal x-ray of the lung: Currently asymptomatic seen on previous x-ray continue to monitor.  Anemia of chronic disease: Response to units of packed red blood cells, hemoglobin this morning is 10.8. No signs of overt bleeding.  Diabetes mellitus type 2 with peripheral neuropathy: Due to poor oral intake hypoglycemic agents were held, especially in the setting of worsening renal dysfunction.  Essential hypertension: Labetalol IV as needed not able to take orals. When she is more awake and able to take orals can resume Coreg.  Endometrial cancer stage IV: To resume outpatient chemotherapy   DVT prophylaxis: none Family Communication:none Status is: Inpatient Remains inpatient appropriate because: Acute CVA Pseudomonas bacteremia    Code Status:     Code Status Orders  (From admission, onward)           Start     Ordered   11/28/22 1537  Full code  Continuous        Question:  By:  Answer:  Consent: discussion documented in  EHR   11/28/22 1547           Code Status History     Date Active Date Inactive Code Status Order ID Comments User Context   09/01/2021 2140 09/05/2021 1404 Full Code [id number]  Synetta Fail, MD Inpatient         IV Access:   Peripheral IV   Procedures and diagnostic studies:   No results found.   Medical Consultants:   None.   Subjective:    Jaime Gomez no complaints, she is really good mood today  Objective:    Vitals:   12/07/22 0826 12/07/22 1330 12/07/22 2044 12/08/22 0442  BP:  99/64 (!) 100/58 103/67  Pulse: 96 92 76 78  Resp:  16 20 20   Temp:  (!) 97.4 F (36.3 C) (!) 97.5 F (36.4 C) 98.6 F (37 C)  TempSrc:  Oral Oral Oral  SpO2:  100% 96% 95%  Weight:      Height:       SpO2: 95 %   Intake/Output Summary (Last 24 hours) at 12/08/2022 0910 Last data filed at 12/08/2022 4098 Gross per 24 hour  Intake 1180.46 ml  Output 350 ml  Net 830.46 ml    Filed Weights   11/28/22 0819  Weight: 60.8 kg    Exam: General exam: In no acute distress. Respiratory system: Good air movement and clear to auscultation. Cardiovascular system: S1 & S2 heard, RRR. No JVD. Gastrointestinal system: Abdomen is nondistended, soft and nontender.  Extremities: No pedal edema. Skin: No rashes, lesions or ulcers Psychiatry: Judgement and insight appear normal. Mood & affect appropriate.  Data Reviewed:    Labs: Basic Metabolic Panel: Recent Labs  Lab 12/02/22 0416 12/03/22 0255 12/04/22 0233 12/05/22 0637 12/06/22 0359 12/07/22 1652  NA 137 132* 139 143 146* 141  K 3.4* 3.5 3.4* 3.7 3.5 3.0*  CL 109 116* 110 113* 113* 108  CO2 14* 13* 15* 16* 20* 19*  GLUCOSE 59* 98 203* 140* 113* 162*  BUN 80* 71* 73* 64* 63* 50*  CREATININE 4.32* 3.85* 3.65* 3.48* 3.19* 2.62*  CALCIUM 8.0* 7.4* 8.2* 8.8* 9.0 8.3*  MG 2.1 2.0 1.8 1.8 1.8  --     GFR Estimated Creatinine Clearance: 21.1  mL/min (A) (by C-G formula based on SCr of 2.62 mg/dL (H)). Liver Function Tests: No results for input(s): "AST", "ALT", "ALKPHOS", "BILITOT", "PROT", "ALBUMIN" in the last 168 hours. No results for input(s): "LIPASE", "AMYLASE" in the last 168 hours. Recent Labs  Lab 12/03/22 1335  AMMONIA 17    Coagulation profile No results for input(s): "INR", "PROTIME" in the last 168 hours.  COVID-19 Labs  No results for input(s): "DDIMER", "FERRITIN", "LDH", "CRP" in the last 72 hours.  Lab Results  Component Value Date   SARSCOV2NAA NEGATIVE 11/28/2022   SARSCOV2NAA NEGATIVE 09/01/2021    CBC: Recent Labs  Lab 12/02/22 0416 12/03/22 0255 12/04/22 0233 12/05/22 0637 12/06/22 0359  WBC 5.9 4.7 4.1 5.5 4.7  NEUTROABS 4.6  --   --   --   --   HGB 9.8* 9.8* 9.9* 11.3* 10.8*  HCT 30.0* 30.6* 30.9* 34.9* 34.3*  MCV 87.5 90.0 90.6 89.5 91.0  PLT 195 181 187 208 186    Cardiac Enzymes: No results for input(s): "CKTOTAL", "CKMB", "CKMBINDEX", "TROPONINI" in the last 168 hours. BNP (last 3 results) No results for input(s): "PROBNP" in the last 8760 hours. CBG: Recent Labs  Lab 12/07/22 0728 12/07/22 1157 12/07/22 1651 12/07/22  2041 12/08/22 0742  GLUCAP 149* 154* 168* 158* 128*    D-Dimer: No results for input(s): "DDIMER" in the last 72 hours. Hgb A1c: No results for input(s): "HGBA1C" in the last 72 hours. Lipid Profile: No results for input(s): "CHOL", "HDL", "LDLCALC", "TRIG", "CHOLHDL", "LDLDIRECT" in the last 72 hours.  Thyroid function studies: No results for input(s): "TSH", "T4TOTAL", "T3FREE", "THYROIDAB" in the last 72 hours.  Invalid input(s): "FREET3"  Anemia work up: No results for input(s): "VITAMINB12", "FOLATE", "FERRITIN", "TIBC", "IRON", "RETICCTPCT" in the last 72 hours.  Sepsis Labs: Recent Labs  Lab 12/03/22 0255 12/03/22 1335 12/04/22 0233 12/05/22 0637 12/06/22 0359  WBC 4.7  --  4.1 5.5 4.7  LATICACIDVEN  --  0.8  --   --   --      Microbiology Recent Results (from the past 240 hour(s))  Culture, blood (Routine X 2) w Reflex to ID Panel     Status: None   Collection Time: 11/30/22  6:25 PM   Specimen: BLOOD RIGHT ARM  Result Value Ref Range Status   Specimen Description   Final    BLOOD RIGHT ARM Performed at [facility], [address], [place], [place] [address]    Special Requests   Final    BOTTLES DRAWN AEROBIC ONLY Blood Culture adequate volume Performed at [facility], [address] [place], [place] [address]    Culture   Final    NO GROWTH 5 DAYS Performed at [facility], [address], [place], [place] [address]    Report Status 12/05/2022 FINAL  Final  Culture, blood (Routine X 2) w Reflex to ID Panel     Status: None   Collection Time: 11/30/22  6:30 PM   Specimen: BLOOD LEFT HAND  Result Value Ref Range Status   Specimen Description   Final    BLOOD LEFT HAND Performed at [facility], [address], [place], [place] [address]    Special Requests   Final    BOTTLES DRAWN AEROBIC ONLY Blood Culture adequate volume Performed at [facility], [address] [place], [place] [address]    Culture   Final    NO GROWTH 5 DAYS Performed at [facility], [address], [place], [place] [address]    Report Status 12/05/2022 FINAL  Final     Medications:    acetaminophen  1,000 mg Oral Once   carvedilol  12.5 mg Oral BID WC   Chlorhexidine Gluconate Cloth  6 each Topical Daily   feeding supplement  237 mL Oral TID BM   folic acid  1 mg Oral Q0600   gabapentin  100 mg Oral BID   insulin aspart  0-9 Units Subcutaneous TID WC   isosorbide-hydrALAZINE  1 tablet Oral TID   melatonin  3 mg Oral QHS   mirtazapine  7.5 mg Oral QHS   oxybutynin  10 mg Oral Daily   sodium bicarbonate  650 mg Oral TID   Continuous Infusions:  heparin 850 Units/hr (12/08/22 0635)   meropenem (MERREM) IV 1 g (12/07/22 2148)      LOS: 10  days   Marinda Elk  [facility]  12/08/2022, 9:10 AM

## 2022-12-09 DIAGNOSIS — A419 Sepsis, unspecified organism: Secondary | ICD-10-CM | POA: Diagnosis not present

## 2022-12-09 DIAGNOSIS — I429 Cardiomyopathy, unspecified: Secondary | ICD-10-CM

## 2022-12-09 DIAGNOSIS — N390 Urinary tract infection, site not specified: Secondary | ICD-10-CM | POA: Diagnosis not present

## 2022-12-09 LAB — COMPREHENSIVE METABOLIC PANEL
ALT: 7 U/L (ref 0–44)
AST: 12 U/L — ABNORMAL LOW (ref 15–41)
Albumin: 2.2 g/dL — ABNORMAL LOW (ref 3.5–5.0)
Alkaline Phosphatase: 68 U/L (ref 38–126)
Anion gap: 8 (ref 5–15)
BUN: 52 mg/dL — ABNORMAL HIGH (ref 6–20)
CO2: 21 mmol/L — ABNORMAL LOW (ref 22–32)
Calcium: 8.4 mg/dL — ABNORMAL LOW (ref 8.9–10.3)
Chloride: 112 mmol/L — ABNORMAL HIGH (ref 98–111)
Creatinine, Ser: 2.89 mg/dL — ABNORMAL HIGH (ref 0.44–1.00)
GFR, Estimated: 18 mL/min — ABNORMAL LOW (ref >60)
Glucose, Bld: 123 mg/dL — ABNORMAL HIGH (ref 70–99)
Potassium: 4.5 mmol/L (ref 3.5–5.1)
Sodium: 141 mmol/L (ref 135–145)
Total Bilirubin: 0.7 mg/dL (ref 0.3–1.2)
Total Protein: 5.8 g/dL — ABNORMAL LOW (ref 6.5–8.1)

## 2022-12-09 LAB — PROTIME-INR
INR: 1.2 (ref 0.8–1.2)
Prothrombin Time: 15.7 seconds — ABNORMAL HIGH (ref 11.4–15.2)

## 2022-12-09 LAB — GLUCOSE, CAPILLARY
Glucose-Capillary: 129 mg/dL — ABNORMAL HIGH (ref 70–99)
Glucose-Capillary: 105 mg/dL — ABNORMAL HIGH (ref 70–99)
Glucose-Capillary: 166 mg/dL — ABNORMAL HIGH (ref 70–99)
Glucose-Capillary: 117 mg/dL — ABNORMAL HIGH (ref 70–99)

## 2022-12-09 MED ORDER — GABAPENTIN 300 MG PO CAPS
300.0000 mg | ORAL_CAPSULE | Freq: Three times a day (TID) | ORAL | Status: DC
  Administered 2022-12-09 – 2022-12-12 (×10): 300 mg via ORAL
  Filled 2022-12-09 (×10): qty 1

## 2022-12-09 MED ORDER — WARFARIN SODIUM 6 MG PO TABS
6.0000 mg | ORAL_TABLET | Freq: Once | ORAL | Status: AC
  Administered 2022-12-09: 6 mg via ORAL
  Filled 2022-12-09: qty 1

## 2022-12-09 NOTE — Progress Notes (Signed)
ANTICOAGULATION CONSULT NOTE   Pharmacy Consult for Lovenox and warfarin Indication: history of DVT  Allergies  Allergen Reactions   Taxotere [Docetaxel] Swelling    Throat swelling    Patient Measurements: Height: 5\' 5"  (165.1 cm) Weight: 63.4 kg (139 lb 12.8 oz) IBW/kg (Calculated) : 57 Heparin Dosing Weight: TBW  Vital Signs: Temp: 98.5 F (36.9 C) (05/18 0442) Temp Source: Oral (05/18 0442) BP: 129/70 (05/18 0816) Pulse Rate: 75 (05/18 0816)  Labs: Recent Labs    12/07/22 1652 12/07/22 1814 12/08/22 0823 12/09/22 0359  HGB  --   --  10.4*  --   HCT  --   --  32.9*  --   PLT  --   --  148*  --   LABPROT  --   --   --  15.7*  INR  --   --   --  1.2  HEPARINUNFRC  --  0.22* 0.24*  --   CREATININE 2.62*  --  2.67* 2.89*     Estimated Creatinine Clearance: 19.1 mL/min (A) (by C-G formula based on SCr of 2.89 mg/dL (H)).   Medications:  PTA Fondaparinux 7.5mg  SQ daily (contraindicated per drug labeling with CrCl chronically < 30 ml/min). Last dose per patient 5/7.  Assessment: Patient is an 57 y.o female admitted on 5/7 for sepsis secondary to urinary tract infection, Pseudomonas bacteremia and AKI on CKD. On 5/8 nephrostomy exchanged by IR. She was on Fondaparinux PTA for chronic DVT in setting of metastatic endometrial cancer.  Today, 12/09/22 Patient is currently on IV heparin infusion.  Due to concerns over attempting to resume fondaparinux in setting of severe CKD (CrCl currently 21 mL/min), attending MD from hospitalist service reports that hematology has recommended transitioning to warfarin with Lovenox overlap.  Baseline INR 1.1 on 11/30/22. No major drug interactions with warfarin on current profile. On regular diet.  Charted as taking small amounts.  Using the warfarin dose initiation algorithm from [facility] (warfarindosing.org) with data for this patient produces an estimated warfarin maintenance dosage of 4.5 mg daily.    This is only an estimation; close INR monitoring will be required during warfarin initiation and maintenance.  INR SUBtherapeutic as expected after 1st warfarin dose of 6mg  SCr 2.89, CrCl 19 No reported bleeding  Goal of Therapy:  INR 2 - 3  Plan:  Repeat warfarin 6mg  today at 1600 Continue Lovenox 1 mg/kg (60 mg) [place] q24h   (dosage reduced due to CrCl < 30 mL/min) Daily PT/INR Monitor for signs of bleeding.  Thank you for allowing pharmacy to be a part of this patient's care.   Hessie Knows, PharmD, BCPS Secure Chat if ?s 12/09/2022 9:37 AM

## 2022-12-09 NOTE — Progress Notes (Signed)
Jaime Gomez continues to improve cognitively.  She really is close to baseline.  She still has a little bit of disorientation but overall, it was wonderful talking with her today.  She is going to need quite a bit of physical therapy and Occupational Therapy.  I think she might be eating a little bit better.  She is on Coumadin now.  I am sure that this will be a challenge to try to regulate.  She has had no obvious bleeding.  Her labs today show sodium 141.  Potassium 4.5.  Blood sugar is 123.  BUN 52 creatinine 2.89.  Calcium is 8.4 with an albumin of 2.2.  There is no CBC back yet for today.  I know that cardiology is following her along.  Hopefully, she will be able to go home sometime next week.  Again she is going to need quite a bit of physical therapy and Occupational Therapy.  She has had neuropathy in her feet.  She is on low-dose of gabapentin.  I probably try and increase the dose somewhat.  There has been no rashes.  Her vital signs show temperature 98.5.  Pulse 83.  Blood pressure 95/55.  Her head and exam shows no ocular or oral lesions.  She has no adenopathy in the neck.  Lungs are clear bilaterally.  She has good air movement bilaterally.  Cardiac exam regular rate and rhythm.  Abdomen is soft.  Bowel sounds are present.  There is no guarding or rebound tenderness.  Extremity shows no clubbing, cyanosis or edema.  Neurological exam is nonfocal.  I am just happy that Jaime Gomez is improving cognitively.  She had neurotoxicity from the Maxipime.  We need to make sure this is on her allergy list.  Hopefully, she will be able to go home next week.  As far as her central line, I do not think we will be a bad idea to hold off a couple weeks until we think about getting another one put back in.  We also need to think about chemotherapy for her metastatic uterine cancer.  However, I probably would hold off for at least 2 or 3 weeks on this.  I do appreciate the incredible care  that she is gone from everybody up on [address].   [name], MD  Psalm 40:11

## 2022-12-09 NOTE — Progress Notes (Signed)
Rounding Note    Patient Name: Jaime Gomez Date of Encounter: 12/09/2022  [facility] Cardiologist: Thurmon Fair, MD   Subjective   Has expressive aphasia, otherwise doing well  Inpatient Medications    Scheduled Meds:  acetaminophen  1,000 mg Oral Once   carvedilol  12.5 mg Oral BID WC   Chlorhexidine Gluconate Cloth  6 each Topical Daily   enoxaparin (LOVENOX) injection  60 mg Subcutaneous Q24H   feeding supplement  1 Container Oral BID BM   folic acid  1 mg Oral Q0600   gabapentin  300 mg Oral TID   insulin aspart  0-9 Units Subcutaneous TID WC   isosorbide-hydrALAZINE  1 tablet Oral TID   melatonin  3 mg Oral QHS   mirtazapine  7.5 mg Oral QHS   oxybutynin  10 mg Oral Daily   sodium bicarbonate  650 mg Oral TID   warfarin  6 mg Oral ONCE-1600   Warfarin - Pharmacist Dosing Inpatient   Does not apply q1600   Continuous Infusions:  meropenem (MERREM) IV 1 g (12/08/22 2146)   PRN Meds: acetaminophen **OR** acetaminophen, guaiFENesin, haloperidol lactate, hydrALAZINE, HYDROmorphone (DILAUDID) injection, ipratropium-albuterol, labetalol, LORazepam, methocarbamol, OLANZapine, ondansetron **OR** ondansetron (ZOFRAN) IV, oxyCODONE, polyethylene glycol, senna-docusate, sorbitol   Vital Signs    Vitals:   12/08/22 1249 12/08/22 2057 12/09/22 0442 12/09/22 0816  BP: 106/63 (!) 91/59 (!) 95/55 129/70  Pulse: 70 80 83 75  Resp: 18 18 18    Temp: 98 F (36.7 C) 98.4 F (36.9 C) 98.5 F (36.9 C)   TempSrc: Oral Oral Oral   SpO2: 98% 96% 97%   Weight:      Height:        Intake/Output Summary (Last 24 hours) at 12/09/2022 1001 Last data filed at 12/09/2022 0645 Gross per 24 hour  Intake 600 ml  Output 150 ml  Net 450 ml      12/08/2022    9:57 AM 12/03/2022    7:07 PM 11/28/2022    8:19 AM  Last 3 Weights  Weight (lbs) 139 lb 12.8 oz 134 lb 134 lb  Weight (kg) 63.413 kg 60.782 kg 60.782 kg      Telemetry    NSR - Personally Reviewed  ECG     No new tracing - Personally Reviewed  Physical Exam   GEN: No acute distress.   Neck: No JVD Cardiac: RRR, no murmurs, rubs, or gallops.  Respiratory: Clear to auscultation bilaterally. GI: Soft, nontender, non-distended  MS: No edema; No deformity. Neuro:  Nonfocal  Psych: Normal affect   Labs    High Sensitivity Troponin:  No results for input(s): "TROPONINIHS" in the last 720 hours.   Chemistry Recent Labs  Lab 12/04/22 0233 12/05/22 0637 12/06/22 0359 12/07/22 1652 12/08/22 0823 12/09/22 0359  NA 139 143 146* 141 140 141  K 3.4* 3.7 3.5 3.0* 3.7 4.5  CL 110 113* 113* 108 110 112*  CO2 15* 16* 20* 19* 21* 21*  GLUCOSE 203* 140* 113* 162* 166* 123*  BUN 73* 64* 63* 50* 48* 52*  CREATININE 3.65* 3.48* 3.19* 2.62* 2.67* 2.89*  CALCIUM 8.2* 8.8* 9.0 8.3* 8.4* 8.4*  MG 1.8 1.8 1.8  --   --   --   PROT  --   --   --   --  6.3* 5.8*  ALBUMIN  --   --   --   --  2.3* 2.2*  AST  --   --   --   --  14* 12*  ALT  --   --   --   --  7 7  ALKPHOS  --   --   --   --  78 68  BILITOT  --   --   --   --  0.5 0.7  GFRNONAA 14* 15* 16* 21* 20* 18*  ANIONGAP 14 14 13 14 9 8     Lipids  Recent Labs  Lab 12/04/22 0232  CHOL 162  TRIG 199*  HDL 29*  LDLCALC 93  CHOLHDL 5.6    Hematology Recent Labs  Lab 12/05/22 0637 12/06/22 0359 12/08/22 0823  WBC 5.5 4.7 5.5  RBC 3.90 3.77* 3.61*  HGB 11.3* 10.8* 10.4*  HCT 34.9* 34.3* 32.9*  MCV 89.5 91.0 91.1  MCH 29.0 28.6 28.8  MCHC 32.4 31.5 31.6  RDW 16.2* 16.4* 16.5*  PLT 208 186 148*   Thyroid  Recent Labs  Lab 12/03/22 1335  TSH 2.520    BNP Recent Labs  Lab 12/03/22 0255  BNP 2,665.1*    DDimer No results for input(s): "DDIMER" in the last 168 hours.   Radiology    No results found.  Cardiac Studies   chocardiogram 12/04/2022: Impressions: 1. Global hypokinesis with apical akinesis; overall moderate LV  dysfunction.   2. Left ventricular ejection fraction, by estimation, is 30 to 35%. The  left  ventricle has moderately decreased function. The left ventricle  demonstrates regional wall motion abnormalities (see scoring  diagram/findings for description). The left  ventricular internal cavity size was mildly dilated. Left ventricular  diastolic parameters are consistent with Grade I diastolic dysfunction  (impaired relaxation).   3. Right ventricular systolic function is normal. The right ventricular  size is normal.   4. The mitral valve is normal in structure. Trivial mitral valve  regurgitation. No evidence of mitral stenosis.   5. The aortic valve is tricuspid. Aortic valve regurgitation is not  visualized. No aortic stenosis is present.   6. The inferior vena cava is normal in size with greater than 50%  respiratory variability, suggesting right atrial pressure of 3 mmHg.   Comparison(s): No prior Echocardiogram.   Patient Profile     57 y.o. 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 Hospitalization has been complicated by AKI, cephalosporin-related encephalopathy, acute vs subacute CVA, and acute on chronic anemia requiring blood transfusion. Cardiology was consulted on 12/05/2022 for further evaluation of new cardiomyopathy.   Assessment & Plan    Mental status continues to improve. It seems that most of her neuro issues were metabolic/toxic rather than due to stroke. No signs of active infection. Repeat blood cultures negative. Very low index of suspicion for endocarditis. Repeat echo on Monday to assess for recovery of LV function from suspected takotsubo sd. --> BP and renal dysfunction precludes escalation of meds for CHF. --> no changes today, don't feel strongly about a repeat echo inpatient. She is doing  well. If she is still here on Monday, can repeat a limited echo. Otherwise, if she is ready to dc we can help with FU      For questions or updates, please contact [facility] Please consult [email or web address] for contact info under        Signed, Alejandrina Raimer, Alben Spittle, MD  12/09/2022, 10:01 AM

## 2022-12-09 NOTE — Progress Notes (Signed)
Mobility Specialist - Progress Note   12/09/22 1335  Mobility  Activity Transferred from chair to bed  Level of Assistance Standby assist, set-up cues, supervision of patient - no hands on  Activity Response Tolerated well  Mobility Referral Yes  $Mobility charge 1 Mobility   Pt received in recliner requesting assistance back to bed. No complaints during transfer. Pt to bed after session with all needs met.    [facility]

## 2022-12-09 NOTE — Progress Notes (Signed)
[facility] PROGRESS NOTE    Progress Note  Jaime Gomez  YQI:[id number] DOB: 02-19-1966 DOA: 11/28/2022 PCP: Staci Acosta, FNP     Brief Narrative:   Jaime Gomez is an 57 y.o. female past medical history significant for endometrial cancer on oral chemotherapy, chronic kidney disease stage III, iron deficiency anemia, essential hypertension diabetes mellitus type 2 history of right lower extremity DVT while on Arixtra, will with a left-sided nephrostomy tube exchange on 11/29/2022 patient admitted for sepsis due to UTI IR consulted nephrostomy tube was placed.  She is status post 2 units of packed red blood cells Hospital course was complicated by acute kidney injury which is slowly improving and further complicated by multifocal CVA concern for embolic versus septic, neurology infectious disease were consulted.    Significant events: Status post percutaneous tube exchange on 11/29/2022. Port has been removed on 12/04/2022. Port-A-Cath removed on 12/04/2022. Transesophageal echo did not show vegetation 12/04/2022.   Assessment/Plan:   Sepsis secondary to UTI (HCC)/Pseudomonas bacteremia: Blood cultures positive for Pseudomonas. Currently on IV meropenem.   ID recommended ciprofloxacin outpatient for 2 weeks. Not a candidate for TEE. Surveillance blood cultures on 11/30/2022 have been negative till date. Has remained afebrile. Physical therapy to reevaluate.  OT evaluated the patient and she is very motivated.  Newly diagnosed HFrEF/Takotsubo cardiomyopathy : Cardiology was consulted and is concerned about stress cardiomyopathy/Takotsubo syndrome. Likely due to Pseudomonas sepsis. Repeat echocardiogram in 1 to 2 weeks to evaluate wall motion abnormality and EF. Continue Coreg and BiDil Cardiology recommended to repeat a 2D echo on 12/11/2022.  Acute/subacute CVA/acute encephalopathy: MRI of the brain showed multiple infarcts. Neurology was consulted.  MRI showed no large vessel  occlusions. EEG showed no seizures, encephalopathy possibly due to cefepime toxicity. Mentation back to baseline family is confirmed more likely due to cefepime toxicity than stroke.  Acute metabolic encephalopathy: Multifactorial likely due to cefepime toxicity. Cefepime has been discontinued. Her mentation is back to baseline, Family has confirmed  Acute kidney injury superimposed on chronic kidney disease stage IIIa: Baseline creatinine of 2.6 on admission 4.9. Treated conservatively her creatinine has returned to baseline. Renal ultrasound showed chronic renal disease.  Hypomagnesemia/hypokalemia: Repeat check the potassium greater than 4 magnesium greater than 2.  Chronic right lower extremity DVT: Previously failed Eliquis as an outpatient now on Arixtra. Due to her renal dysfunction Arixtra was discontinued. Discussed with oncology will transition her to Lovenox and Coumadin overlap. Neurology and infectious disease if no contraindication to anticoagulation.  Depression: With sertraline at bedtime seen by psych.  Abnormal x-ray of the lung: Currently asymptomatic seen on previous x-ray continue to monitor.  Anemia of chronic disease: Response to units of packed red blood cells, hemoglobin this morning is 10.8. No signs of overt bleeding.  Diabetes mellitus type 2 with peripheral neuropathy: Blood glucose well-controlled continue sliding scale insulin.  Essential hypertension: Labetalol IV as needed not able to take orals. Continue Coreg.  Endometrial cancer stage IV: To resume outpatient chemotherapy as an outpatient follow-up with oncology.   DVT prophylaxis: none Family Communication:none Status is: Inpatient Remains inpatient appropriate because: Acute CVA Pseudomonas bacteremia    Code Status:     Code Status Orders  (From admission, onward)           Start     Ordered   11/28/22 1537  Full code  Continuous       Question:  By:  Answer:   Consent: discussion documented in EHR   11/28/22  1547           Code Status History     Date Active Date Inactive Code Status Order ID Comments User Context   09/01/2021 2140 09/05/2021 1404 Full Code [id number]  Synetta Fail, MD Inpatient         IV Access:   Peripheral IV   Procedures and diagnostic studies:   No results found.   Medical Consultants:   None.   Subjective:    Jaime Gomez Really in a good mood today.  Objective:    Vitals:   12/08/22 1249 12/08/22 2057 12/09/22 0442 12/09/22 0816  BP: 106/63 (!) 91/59 (!) 95/55 129/70  Pulse: 70 80 83 75  Resp: 18 18 18    Temp: 98 F (36.7 C) 98.4 F (36.9 C) 98.5 F (36.9 C)   TempSrc: Oral Oral Oral   SpO2: 98% 96% 97%   Weight:      Height:       SpO2: 97 %   Intake/Output Summary (Last 24 hours) at 12/09/2022 0925 Last data filed at 12/09/2022 0645 Gross per 24 hour  Intake 600 ml  Output 150 ml  Net 450 ml    Filed Weights   11/28/22 0819 12/08/22 0957  Weight: 60.8 kg 63.4 kg    Exam: General exam: In no acute distress. Respiratory system: Good air movement and clear to auscultation. Cardiovascular system: S1 & S2 heard, RRR. No JVD. Gastrointestinal system: Abdomen is nondistended, soft and nontender.  Extremities: No pedal edema. Skin: No rashes, lesions or ulcers Psychiatry: Judgement and insight appear normal. Mood & affect appropriate.  Data Reviewed:    Labs: Basic Metabolic Panel: Recent Labs  Lab 12/03/22 0255 12/04/22 0233 12/05/22 4098 12/06/22 0359 12/07/22 1652 12/08/22 0823 12/09/22 0359  NA 132* 139 143 146* 141 140 141  K 3.5 3.4* 3.7 3.5 3.0* 3.7 4.5  CL 116* 110 113* 113* 108 110 112*  CO2 13* 15* 16* 20* 19* 21* 21*  GLUCOSE 98 203* 140* 113* 162* 166* 123*  BUN 71* 73* 64* 63* 50* 48* 52*  CREATININE 3.85* 3.65* 3.48* 3.19* 2.62* 2.67* 2.89*  CALCIUM 7.4* 8.2* 8.8* 9.0 8.3* 8.4* 8.4*  MG 2.0 1.8 1.8 1.8  --   --   --     GFR Estimated  Creatinine Clearance: 19.1 mL/min (A) (by C-G formula based on SCr of 2.89 mg/dL (H)). Liver Function Tests: Recent Labs  Lab 12/08/22 0823 12/09/22 0359  AST 14* 12*  ALT 7 7  ALKPHOS 78 68  BILITOT 0.5 0.7  PROT 6.3* 5.8*  ALBUMIN 2.3* 2.2*   No results for input(s): "LIPASE", "AMYLASE" in the last 168 hours. Recent Labs  Lab 12/03/22 1335  AMMONIA 17    Coagulation profile Recent Labs  Lab 12/09/22 0359  INR 1.2    COVID-19 Labs  No results for input(s): "DDIMER", "FERRITIN", "LDH", "CRP" in the last 72 hours.  Lab Results  Component Value Date   SARSCOV2NAA NEGATIVE 11/28/2022   SARSCOV2NAA NEGATIVE 09/01/2021    CBC: Recent Labs  Lab 12/03/22 0255 12/04/22 0233 12/05/22 0637 12/06/22 0359 12/08/22 0823  WBC 4.7 4.1 5.5 4.7 5.5  HGB 9.8* 9.9* 11.3* 10.8* 10.4*  HCT 30.6* 30.9* 34.9* 34.3* 32.9*  MCV 90.0 90.6 89.5 91.0 91.1  PLT 181 187 208 186 148*    Cardiac Enzymes: No results for input(s): "CKTOTAL", "CKMB", "CKMBINDEX", "TROPONINI" in the last 168 hours. BNP (last 3 results) No results for input(s): "PROBNP" in  the last 8760 hours. CBG: Recent Labs  Lab 12/08/22 0742 12/08/22 1146 12/08/22 1733 12/08/22 2057 12/09/22 0749  GLUCAP 128* 138* 186* 182* 129*    D-Dimer: No results for input(s): "DDIMER" in the last 72 hours. Hgb A1c: No results for input(s): "HGBA1C" in the last 72 hours. Lipid Profile: No results for input(s): "CHOL", "HDL", "LDLCALC", "TRIG", "CHOLHDL", "LDLDIRECT" in the last 72 hours.  Thyroid function studies: No results for input(s): "TSH", "T4TOTAL", "T3FREE", "THYROIDAB" in the last 72 hours.  Invalid input(s): "FREET3"  Anemia work up: No results for input(s): "VITAMINB12", "FOLATE", "FERRITIN", "TIBC", "IRON", "RETICCTPCT" in the last 72 hours.  Sepsis Labs: Recent Labs  Lab 12/03/22 1335 12/04/22 0233 12/05/22 0637 12/06/22 0359 12/08/22 0823  WBC  --  4.1 5.5 4.7 5.5  LATICACIDVEN 0.8  --   --    --   --     Microbiology Recent Results (from the past 240 hour(s))  Culture, blood (Routine X 2) w Reflex to ID Panel     Status: None   Collection Time: 11/30/22  6:25 PM   Specimen: BLOOD RIGHT ARM  Result Value Ref Range Status   Specimen Description   Final    BLOOD RIGHT ARM Performed at [facility], [address], [place], [place] [address]    Special Requests   Final    BOTTLES DRAWN AEROBIC ONLY Blood Culture adequate volume Performed at [facility], [address], [place], [place] [address]    Culture   Final    NO GROWTH 5 DAYS Performed at [facility], [address], [place], [place] [address]    Report Status 12/05/2022 FINAL  Final  Culture, blood (Routine X 2) w Reflex to ID Panel     Status: None   Collection Time: 11/30/22  6:30 PM   Specimen: BLOOD LEFT HAND  Result Value Ref Range Status   Specimen Description   Final    BLOOD LEFT HAND Performed at [facility], [address] [place], [place] [address]    Special Requests   Final    BOTTLES DRAWN AEROBIC ONLY Blood Culture adequate volume Performed at [facility], [address], [place], [place] [address]    Culture   Final    NO GROWTH 5 DAYS Performed at [facility], [address] [place], [place] [address]    Report Status 12/05/2022 FINAL  Final     Medications:    acetaminophen  1,000 mg Oral Once   carvedilol  12.5 mg Oral BID WC   Chlorhexidine Gluconate Cloth  6 each Topical Daily   enoxaparin (LOVENOX) injection  60 mg Subcutaneous Q24H   feeding supplement  1 Container Oral BID BM   folic acid  1 mg Oral Q0600   gabapentin  300 mg Oral TID   insulin aspart  0-9 Units Subcutaneous TID WC   isosorbide-hydrALAZINE  1 tablet Oral TID   melatonin  3 mg Oral QHS   mirtazapine  7.5 mg Oral QHS   oxybutynin  10 mg Oral Daily   sodium bicarbonate  650 mg Oral TID   Warfarin - Pharmacist Dosing Inpatient   Does not  apply q1600   Continuous Infusions:  meropenem (MERREM) IV 1 g (12/08/22 2146)      LOS: 11 days   Marinda Elk  [facility]  12/09/2022, 9:25 AM

## 2022-12-10 DIAGNOSIS — N390 Urinary tract infection, site not specified: Secondary | ICD-10-CM | POA: Diagnosis not present

## 2022-12-10 DIAGNOSIS — A419 Sepsis, unspecified organism: Secondary | ICD-10-CM | POA: Diagnosis not present

## 2022-12-10 LAB — COMPREHENSIVE METABOLIC PANEL
ALT: 7 U/L (ref 0–44)
AST: 11 U/L — ABNORMAL LOW (ref 15–41)
Albumin: 2.2 g/dL — ABNORMAL LOW (ref 3.5–5.0)
Alkaline Phosphatase: 63 U/L (ref 38–126)
Anion gap: 10 (ref 5–15)
BUN: 52 mg/dL — ABNORMAL HIGH (ref 6–20)
CO2: 20 mmol/L — ABNORMAL LOW (ref 22–32)
Calcium: 8.4 mg/dL — ABNORMAL LOW (ref 8.9–10.3)
Chloride: 110 mmol/L (ref 98–111)
Creatinine, Ser: 2.9 mg/dL — ABNORMAL HIGH (ref 0.44–1.00)
GFR, Estimated: 18 mL/min — ABNORMAL LOW (ref >60)
Glucose, Bld: 117 mg/dL — ABNORMAL HIGH (ref 70–99)
Potassium: 4.7 mmol/L (ref 3.5–5.1)
Sodium: 140 mmol/L (ref 135–145)
Total Bilirubin: 0.8 mg/dL (ref 0.3–1.2)
Total Protein: 5.7 g/dL — ABNORMAL LOW (ref 6.5–8.1)

## 2022-12-10 LAB — CBC WITH DIFFERENTIAL/PLATELET
Abs Immature Granulocytes: 0.07 10*3/uL (ref 0.00–0.07)
Basophils Absolute: 0 10*3/uL (ref 0.0–0.1)
Basophils Relative: 1 %
Eosinophils Absolute: 0.1 10*3/uL (ref 0.0–0.5)
Eosinophils Relative: 2 %
HCT: 28 % — ABNORMAL LOW (ref 36.0–46.0)
Hemoglobin: 8.5 g/dL — ABNORMAL LOW (ref 12.0–15.0)
Immature Granulocytes: 2 %
Lymphocytes Relative: 19 %
Lymphs Abs: 0.9 10*3/uL (ref 0.7–4.0)
MCH: 28.6 pg (ref 26.0–34.0)
MCHC: 30.4 g/dL (ref 30.0–36.0)
MCV: 94.3 fL (ref 80.0–100.0)
Monocytes Absolute: 0.4 10*3/uL (ref 0.1–1.0)
Monocytes Relative: 9 %
Neutro Abs: 3.1 10*3/uL (ref 1.7–7.7)
Neutrophils Relative %: 67 %
Platelets: 135 10*3/uL — ABNORMAL LOW (ref 150–400)
RBC: 2.97 MIL/uL — ABNORMAL LOW (ref 3.87–5.11)
RDW: 17 % — ABNORMAL HIGH (ref 11.5–15.5)
WBC: 4.5 10*3/uL (ref 4.0–10.5)
nRBC: 0 % (ref 0.0–0.2)

## 2022-12-10 LAB — PROTIME-INR
INR: 1.2 (ref 0.8–1.2)
Prothrombin Time: 15.2 seconds (ref 11.4–15.2)

## 2022-12-10 LAB — GLUCOSE, CAPILLARY
Glucose-Capillary: 116 mg/dL — ABNORMAL HIGH (ref 70–99)
Glucose-Capillary: 142 mg/dL — ABNORMAL HIGH (ref 70–99)
Glucose-Capillary: 163 mg/dL — ABNORMAL HIGH (ref 70–99)
Glucose-Capillary: 161 mg/dL — ABNORMAL HIGH (ref 70–99)

## 2022-12-10 MED ORDER — ORAL CARE MOUTH RINSE: 15.0000 mL | OROMUCOSAL | Status: DC | PRN

## 2022-12-10 MED ORDER — PHENOL 1.4 % MT LIQD
1.0000 | OROMUCOSAL | Status: DC | PRN
  Administered 2022-12-10: 1 via OROMUCOSAL
  Filled 2022-12-10: qty 177

## 2022-12-10 MED ORDER — WARFARIN SODIUM 5 MG PO TABS
7.5000 mg | ORAL_TABLET | Freq: Once | ORAL | Status: AC
  Administered 2022-12-10: 7.5 mg via ORAL
  Filled 2022-12-10: qty 1

## 2022-12-10 NOTE — Progress Notes (Signed)
ANTICOAGULATION CONSULT NOTE   Pharmacy Consult for Lovenox and warfarin Indication: history of DVT  Allergies  Allergen Reactions   Taxotere [Docetaxel] Swelling    Throat swelling    Patient Measurements: Height: 5\' 5"  (165.1 cm) Weight: 63.4 kg (139 lb 12.8 oz) IBW/kg (Calculated) : 57 Heparin Dosing Weight: TBW  Vital Signs: Temp: 98.7 F (37.1 C) (05/19 0336) Temp Source: Oral (05/19 0336) BP: 120/71 (05/19 0336) Pulse Rate: 74 (05/19 0336)  Labs: Recent Labs    12/07/22 1814 12/08/22 0823 12/09/22 0359 12/10/22 0324  HGB  --  10.4*  --  8.5*  HCT  --  32.9*  --  28.0*  PLT  --  148*  --  135*  LABPROT  --   --  15.7* 15.2  INR  --   --  1.2 1.2  HEPARINUNFRC 0.22* 0.24*  --   --   CREATININE  --  2.67* 2.89* 2.90*     Estimated Creatinine Clearance: 19 mL/min (A) (by C-G formula based on SCr of 2.9 mg/dL (H)).   Medications:  PTA Fondaparinux 7.5mg  SQ daily (contraindicated per drug labeling with CrCl chronically < 30 ml/min). Last dose per patient 5/7.  Assessment: Patient is an 57 y.o female admitted on 5/7 for sepsis secondary to urinary tract infection, Pseudomonas bacteremia and AKI on CKD. On 5/8 nephrostomy exchanged by IR. She was on Fondaparinux PTA for chronic DVT in setting of metastatic endometrial cancer.  Today, 12/10/22 Patient is currently on IV heparin infusion.  Due to concerns over attempting to resume fondaparinux in setting of severe CKD (CrCl currently 21 mL/min), attending MD from hospitalist service reports that hematology has recommended transitioning to warfarin with Lovenox overlap.  Baseline INR 1.1 on 11/30/22. No major drug interactions with warfarin on current profile. On regular diet.  Charted as taking small amounts.  Using the warfarin dose initiation algorithm from [facility] (warfarindosing.org) with data for this patient produces an estimated warfarin maintenance dosage of 4.5 mg daily.   This  is only an estimation; close INR monitoring will be required during warfarin initiation and maintenance.  INR SUBtherapeutic and unchanged after 2 dose of 6mg  of warfarin SCr 2.90 unchanged, CrCl 19 No reported bleeding Hgb 8.5, down. Plts 135 down - monitor closely No reported bleeding  Goal of Therapy:  INR 2 - 3  Plan:  Warfarin 7.5mg  today at 1600 Continue Lovenox 1 mg/kg (60 mg) [place] q24h   (dosage reduced due to CrCl < 30 mL/min) Daily PT/INR Monitor for signs of bleeding.  Thank you for allowing pharmacy to be a part of this patient's care.   Hessie Knows, PharmD, BCPS Secure Chat if ?s 12/10/2022 10:54 AM

## 2022-12-10 NOTE — Progress Notes (Signed)
[facility] PROGRESS NOTE    Progress Note  Jaime Gomez  AVW:[id number] DOB: Nov 25, 1965 DOA: 11/28/2022 PCP: Staci Acosta, FNP     Brief Narrative:   Jaime Gomez is an 57 y.o. female past medical history significant for endometrial cancer on oral chemotherapy, chronic kidney disease stage III, iron deficiency anemia, essential hypertension diabetes mellitus type 2 history of right lower extremity DVT while on Arixtra, will with a left-sided nephrostomy tube exchange on 11/29/2022 patient admitted for sepsis due to UTI IR consulted nephrostomy tube was placed.  She is status post 2 units of packed red blood cells Hospital course was complicated by acute kidney injury which is slowly improving and further complicated by multifocal CVA concern for embolic versus septic, neurology infectious disease were consulted.    Significant events: Status post percutaneous tube exchange on 11/29/2022. Port has been removed on 12/04/2022. Port-A-Cath removed on 12/04/2022. Transesophageal echo did not show vegetation 12/04/2022.   Assessment/Plan:   Sepsis secondary to UTI (HCC)/Pseudomonas bacteremia: Blood cultures positive for Pseudomonas. Currently on IV meropenem.   ID recommended ciprofloxacin outpatient for 2 weeks. Not a candidate for TEE. Surveillance blood cultures on 11/30/2022 have been negative till date. Has remained afebrile. Physical therapy negative additional therapy evaluated the patient, she will need skilled nursing facility. He will like patient evaluated by PT to see if there is a chance she could probably go home with physical therapy.  Newly diagnosed HFrEF/Takotsubo cardiomyopathy : Cardiology was consulted and is concerned about stress cardiomyopathy/Takotsubo syndrome. Likely due to Pseudomonas sepsis. Continue Coreg and BiDil Cardiology recommended to repeat a 2D echo on 12/11/2022.  Acute/subacute CVA/acute encephalopathy: MRI of the brain showed multiple  infarcts. Neurology was consulted.  MRI showed no large vessel occlusions. EEG showed no seizures, encephalopathy possibly due to cefepime toxicity. Mentation back to baseline family is confirmed more likely due to cefepime toxicity than stroke.  Acute metabolic encephalopathy: Multifactorial likely due to cefepime toxicity. Cefepime has been discontinued. Her mentation is back to baseline, Family has confirmed  Acute kidney injury superimposed on chronic kidney disease stage IIIa: Baseline creatinine of 2.6 on admission 4.9. Treated conservatively her creatinine has returned to baseline. Renal ultrasound showed chronic renal disease.  Hypomagnesemia/hypokalemia: Repeat check the potassium greater than 4 magnesium greater than 2.  Chronic right lower extremity DVT: Previously failed Eliquis as an outpatient now on Arixtra. Due to her renal dysfunction Arixtra was discontinued. Discussed with oncology will transition her to Lovenox and Coumadin overlap. Neurology and infectious disease if no contraindication to anticoagulation.  Depression: With sertraline at bedtime seen by psych.  Abnormal x-ray of the lung: Currently asymptomatic seen on previous x-ray continue to monitor.  Anemia of chronic disease: Response to units of packed red blood cells, hemoglobin this morning is 10.8. No signs of overt bleeding.  Diabetes mellitus type 2 with peripheral neuropathy: Blood glucose well-controlled continue sliding scale insulin.  Essential hypertension: Labetalol IV as needed not able to take orals. Continue Coreg.  Endometrial cancer stage IV: To resume outpatient chemotherapy as an outpatient follow-up with oncology.   DVT prophylaxis: none Family Communication:none Status is: Inpatient Remains inpatient appropriate because: Acute CVA Pseudomonas bacteremia    Code Status:     Code Status Orders  (From admission, onward)           Start     Ordered   11/28/22  1537  Full code  Continuous       Question:  By:  Answer:  Consent: discussion documented in EHR   11/28/22 1547           Code Status History     Date Active Date Inactive Code Status Order ID Comments User Context   09/01/2021 2140 09/05/2021 1404 Full Code [id number]  Synetta Fail, MD Inpatient         IV Access:   Peripheral IV   Procedures and diagnostic studies:   No results found.   Medical Consultants:   None.   Subjective:    Jaime Gomez in a good mood today back to baseline.  Objective:    Vitals:   12/09/22 1532 12/09/22 2150 12/10/22 0030 12/10/22 0336  BP: 117/77 125/69  120/71  Pulse: 74 82  74  Resp:  16  16  Temp: 98.9 F (37.2 C) 99.5 F (37.5 C) 98.6 F (37 C) 98.7 F (37.1 C)  TempSrc: Oral Oral Oral Oral  SpO2: 100% 98%  100%  Weight:      Height:       SpO2: 100 %   Intake/Output Summary (Last 24 hours) at 12/10/2022 1028 Last data filed at 12/10/2022 0839 Gross per 24 hour  Intake 980 ml  Output 150 ml  Net 830 ml    Filed Weights   11/28/22 0819 12/08/22 0957  Weight: 60.8 kg 63.4 kg    Exam: General exam: In no acute distress. Respiratory system: Good air movement and clear to auscultation. Cardiovascular system: S1 & S2 heard, RRR. No JVD. Gastrointestinal system: Abdomen is nondistended, soft and nontender.  Extremities: No pedal edema. Skin: No rashes, lesions or ulcers Psychiatry: Judgement and insight appear normal. Mood & affect appropriate.  Data Reviewed:    Labs: Basic Metabolic Panel: Recent Labs  Lab 12/04/22 0233 12/05/22 4098 12/06/22 0359 12/07/22 1652 12/08/22 0823 12/09/22 0359 12/10/22 0324  NA 139 143 146* 141 140 141 140  K 3.4* 3.7 3.5 3.0* 3.7 4.5 4.7  CL 110 113* 113* 108 110 112* 110  CO2 15* 16* 20* 19* 21* 21* 20*  GLUCOSE 203* 140* 113* 162* 166* 123* 117*  BUN 73* 64* 63* 50* 48* 52* 52*  CREATININE 3.65* 3.48* 3.19* 2.62* 2.67* 2.89* 2.90*  CALCIUM 8.2* 8.8* 9.0  8.3* 8.4* 8.4* 8.4*  MG 1.8 1.8 1.8  --   --   --   --     GFR Estimated Creatinine Clearance: 19 mL/min (A) (by C-G formula based on SCr of 2.9 mg/dL (H)). Liver Function Tests: Recent Labs  Lab 12/08/22 0823 12/09/22 0359 12/10/22 0324  AST 14* 12* 11*  ALT 7 7 7   ALKPHOS 78 68 63  BILITOT 0.5 0.7 0.8  PROT 6.3* 5.8* 5.7*  ALBUMIN 2.3* 2.2* 2.2*    No results for input(s): "LIPASE", "AMYLASE" in the last 168 hours. Recent Labs  Lab 12/03/22 1335  AMMONIA 17    Coagulation profile Recent Labs  Lab 12/09/22 0359 12/10/22 0324  INR 1.2 1.2    COVID-19 Labs  No results for input(s): "DDIMER", "FERRITIN", "LDH", "CRP" in the last 72 hours.  Lab Results  Component Value Date   SARSCOV2NAA NEGATIVE 11/28/2022   SARSCOV2NAA NEGATIVE 09/01/2021    CBC: Recent Labs  Lab 12/04/22 0233 12/05/22 0637 12/06/22 0359 12/08/22 0823 12/10/22 0324  WBC 4.1 5.5 4.7 5.5 4.5  NEUTROABS  --   --   --   --  3.1  HGB 9.9* 11.3* 10.8* 10.4* 8.5*  HCT 30.9* 34.9* 34.3* 32.9* 28.0*  MCV  90.6 89.5 91.0 91.1 94.3  PLT 187 208 186 148* 135*    Cardiac Enzymes: No results for input(s): "CKTOTAL", "CKMB", "CKMBINDEX", "TROPONINI" in the last 168 hours. BNP (last 3 results) No results for input(s): "PROBNP" in the last 8760 hours. CBG: Recent Labs  Lab 12/09/22 0749 12/09/22 1143 12/09/22 1634 12/09/22 2147 12/10/22 0827  GLUCAP 129* 105* 166* 117* 116*    D-Dimer: No results for input(s): "DDIMER" in the last 72 hours. Hgb A1c: No results for input(s): "HGBA1C" in the last 72 hours. Lipid Profile: No results for input(s): "CHOL", "HDL", "LDLCALC", "TRIG", "CHOLHDL", "LDLDIRECT" in the last 72 hours.  Thyroid function studies: No results for input(s): "TSH", "T4TOTAL", "T3FREE", "THYROIDAB" in the last 72 hours.  Invalid input(s): "FREET3"  Anemia work up: No results for input(s): "VITAMINB12", "FOLATE", "FERRITIN", "TIBC", "IRON", "RETICCTPCT" in the last 72  hours.  Sepsis Labs: Recent Labs  Lab 12/03/22 1335 12/04/22 0233 12/05/22 4098 12/06/22 0359 12/08/22 0823 12/10/22 0324  WBC  --    < > 5.5 4.7 5.5 4.5  LATICACIDVEN 0.8  --   --   --   --   --    < > = values in this interval not displayed.    Microbiology Recent Results (from the past 240 hour(s))  Culture, blood (Routine X 2) w Reflex to ID Panel     Status: None   Collection Time: 11/30/22  6:25 PM   Specimen: BLOOD RIGHT ARM  Result Value Ref Range Status   Specimen Description   Final    BLOOD RIGHT ARM Performed at [facility], [address] [place], [place] [address]    Special Requests   Final    BOTTLES DRAWN AEROBIC ONLY Blood Culture adequate volume Performed at [facility], [address], [place], [place] [address]    Culture   Final    NO GROWTH 5 DAYS Performed at [facility], [address], [place], [place] [address]    Report Status 12/05/2022 FINAL  Final  Culture, blood (Routine X 2) w Reflex to ID Panel     Status: None   Collection Time: 11/30/22  6:30 PM   Specimen: BLOOD LEFT HAND  Result Value Ref Range Status   Specimen Description   Final    BLOOD LEFT HAND Performed at [facility], [address], [place], [place] [address]    Special Requests   Final    BOTTLES DRAWN AEROBIC ONLY Blood Culture adequate volume Performed at [facility], [address], [place], [place] [address]    Culture   Final    NO GROWTH 5 DAYS Performed at [facility], [address], [place], [place] [address]    Report Status 12/05/2022 FINAL  Final     Medications:    acetaminophen  1,000 mg Oral Once   carvedilol  12.5 mg Oral BID WC   Chlorhexidine Gluconate Cloth  6 each Topical Daily   enoxaparin (LOVENOX) injection  60 mg Subcutaneous Q24H   feeding supplement  1 Container Oral BID BM   folic acid  1 mg Oral Q0600   gabapentin  300 mg Oral TID   insulin aspart  0-9 Units  Subcutaneous TID WC   isosorbide-hydrALAZINE  1 tablet Oral TID   melatonin  3 mg Oral QHS   mirtazapine  7.5 mg Oral QHS   oxybutynin  10 mg Oral Daily   sodium  bicarbonate  650 mg Oral TID   Warfarin - Pharmacist Dosing Inpatient   Does not apply q1600   Continuous Infusions:  meropenem (MERREM) IV Stopped (12/09/22 2239)      LOS: 12 days   Marinda Elk  [facility]  12/10/2022, 10:28 AM

## 2022-12-10 NOTE — TOC Progression Note (Signed)
Transition of Care [facility]) - Progression Note    Patient Details  Name: Jaime Gomez MRN: [id number] Date of Birth: 14-Feb-1966  Transition of Care [facility]) CM/SW Contact  Howell Rucks, RN Phone Number: 12/10/2022, 1:43 PM  Clinical Narrative:   Call to pt's sister [name]), answered questions regarding short term rehab, states she will meet with PT tomorrow to discuss due to pt;s improvement with functional mobility if short term rehab is still required. [name] chose [facility] for short term rehab. NCM accepted in SNF HUB, call to [facility], voice mail left for rep-Kristine notifying of faciity acceptance. Will follow up tomorrow.     Expected Discharge Plan: Home/Self Care Barriers to Discharge: Continued Medical Work up  Expected Discharge Plan and Services   Discharge Planning Services: CM Consult                                           Social Determinants of Health (SDOH) Interventions SDOH Screenings   Food Insecurity: No Food Insecurity (11/28/2022)  Housing: Low Risk  (11/28/2022)  Transportation Needs: No Transportation Needs (11/28/2022)  Utilities: Not At Risk (11/28/2022)  Depression (PHQ2-9): Low Risk  (09/18/2022)  Financial Resource Strain: Low Risk  (06/27/2022)  Stress: No Stress Concern Present (06/27/2022)  Tobacco Use: Low Risk  (12/04/2022)    Readmission Risk Interventions    11/30/2022   10:29 AM  Readmission Risk Prevention Plan  Transportation Screening Complete  PCP or Specialist Appt within 3-5 Days Complete  HRI or Home Care Consult Complete  Social Work Consult for Recovery Care Planning/Counseling Complete  Palliative Care Screening Not Applicable  Medication Review Oceanographer) Complete

## 2022-12-10 NOTE — Progress Notes (Signed)
Pharmacy Antibiotic Note  Jaime Gomez is a 57 y.o. female admitted on 11/28/2022 with sepsis secondary to Pseudomonas aeruginosa bacteremia. Pharmacy has been consulted for Meropenem dosing.  Day 6 meropenem (note completed 6 days of cefepime prior to that) SCr 2.9 slowly trending back up, est CrCl 19 Afebrile WBC stable WNL 5/9 repeat BCx no growth - final  Plan: Continue Meropenem 1g IV q12h per current renal function Monitor renal function, clinical course, further ID recommendations and LOT   Height: 5\' 5"  (165.1 cm) Weight: 63.4 kg (139 lb 12.8 oz) IBW/kg (Calculated) : 57  Temp (24hrs), Avg:98.9 F (37.2 C), Min:98.6 F (37 C), Max:99.5 F (37.5 C)  Recent Labs  Lab 12/03/22 1335 12/04/22 0233 12/04/22 0233 12/05/22 0637 12/06/22 0359 12/07/22 1652 12/08/22 0823 12/09/22 0359 12/10/22 0324  WBC  --  4.1  --  5.5 4.7  --  5.5  --  4.5  CREATININE  --  3.65*   < > 3.48* 3.19* 2.62* 2.67* 2.89* 2.90*  LATICACIDVEN 0.8  --   --   --   --   --   --   --   --    < > = values in this interval not displayed.     Estimated Creatinine Clearance: 19 mL/min (A) (by C-G formula based on SCr of 2.9 mg/dL (H)).    Allergies  Allergen Reactions   Taxotere [Docetaxel] Swelling    Throat swelling    Antimicrobials this admission: 5/7 Cefepime >> 5/13 5/13 Meropenem >>   Microbiology results: 5/7 BCx: 1/4 bottles Pseudomonas aeruginosa (pan-sensitive) 5/7 UCx: > 100K Lactobacillus species 5/9 BCx: ngf   Thank you for allowing pharmacy to be a part of this patient's care.   Hessie Knows, PharmD, BCPS Secure Chat if ?s 12/10/2022 10:54 AM

## 2022-12-10 NOTE — Plan of Care (Signed)
  Problem: Education: Goal: Knowledge of General Education information will improve Description: Including pain rating scale, medication(s)/side effects and non-pharmacologic comfort measures Outcome: Progressing   Problem: Coping: Goal: Ability to identify and develop effective coping behavior will improve Outcome: Progressing   Problem: Ischemic Stroke/TIA Tissue Perfusion: Goal: Complications of ischemic stroke/TIA will be minimized Outcome: Progressing   Problem: Self-Care: Goal: Ability to communicate needs accurately will improve Outcome: Progressing   Problem: Safety: Goal: Ability to remain free from injury will improve Outcome: Progressing   Problem: Pain Managment: Goal: General experience of comfort will improve Outcome: Progressing

## 2022-12-11 ENCOUNTER — Inpatient Hospital Stay (HOSPITAL_COMMUNITY): Payer: 59

## 2022-12-11 DIAGNOSIS — A419 Sepsis, unspecified organism: Secondary | ICD-10-CM | POA: Diagnosis not present

## 2022-12-11 DIAGNOSIS — I428 Other cardiomyopathies: Secondary | ICD-10-CM

## 2022-12-11 DIAGNOSIS — N390 Urinary tract infection, site not specified: Secondary | ICD-10-CM | POA: Diagnosis not present

## 2022-12-11 LAB — CBC WITH DIFFERENTIAL/PLATELET
Abs Immature Granulocytes: 0.06 10*3/uL (ref 0.00–0.07)
Basophils Absolute: 0 10*3/uL (ref 0.0–0.1)
Basophils Relative: 1 %
Eosinophils Absolute: 0.1 10*3/uL (ref 0.0–0.5)
Eosinophils Relative: 2 %
HCT: 28.7 % — ABNORMAL LOW (ref 36.0–46.0)
Hemoglobin: 8.4 g/dL — ABNORMAL LOW (ref 12.0–15.0)
Immature Granulocytes: 1 %
Lymphocytes Relative: 19 %
Lymphs Abs: 0.8 10*3/uL (ref 0.7–4.0)
MCH: 28.7 pg (ref 26.0–34.0)
MCHC: 29.3 g/dL — ABNORMAL LOW (ref 30.0–36.0)
MCV: 98 fL (ref 80.0–100.0)
Monocytes Absolute: 0.5 10*3/uL (ref 0.1–1.0)
Monocytes Relative: 11 %
Neutro Abs: 2.9 10*3/uL (ref 1.7–7.7)
Neutrophils Relative %: 66 %
Platelets: 147 10*3/uL — ABNORMAL LOW (ref 150–400)
RBC: 2.93 MIL/uL — ABNORMAL LOW (ref 3.87–5.11)
RDW: 17.3 % — ABNORMAL HIGH (ref 11.5–15.5)
WBC: 4.4 10*3/uL (ref 4.0–10.5)
nRBC: 0 % (ref 0.0–0.2)

## 2022-12-11 LAB — COMPREHENSIVE METABOLIC PANEL
ALT: 7 U/L (ref 0–44)
AST: 12 U/L — ABNORMAL LOW (ref 15–41)
Albumin: 2.2 g/dL — ABNORMAL LOW (ref 3.5–5.0)
Alkaline Phosphatase: 67 U/L (ref 38–126)
Anion gap: 8 (ref 5–15)
BUN: 53 mg/dL — ABNORMAL HIGH (ref 6–20)
CO2: 21 mmol/L — ABNORMAL LOW (ref 22–32)
Calcium: 8.3 mg/dL — ABNORMAL LOW (ref 8.9–10.3)
Chloride: 109 mmol/L (ref 98–111)
Creatinine, Ser: 3.04 mg/dL — ABNORMAL HIGH (ref 0.44–1.00)
GFR, Estimated: 17 mL/min — ABNORMAL LOW (ref >60)
Glucose, Bld: 121 mg/dL — ABNORMAL HIGH (ref 70–99)
Potassium: 4.8 mmol/L (ref 3.5–5.1)
Sodium: 138 mmol/L (ref 135–145)
Total Bilirubin: 0.6 mg/dL (ref 0.3–1.2)
Total Protein: 5.9 g/dL — ABNORMAL LOW (ref 6.5–8.1)

## 2022-12-11 LAB — ECHOCARDIOGRAM LIMITED
Calc EF: 47.2 %
Height: 65 in
S' Lateral: 3.8 cm
Single Plane A2C EF: 45.4 %
Single Plane A4C EF: 51.8 %
Weight: 2236.8 oz

## 2022-12-11 LAB — PROTIME-INR
INR: 1.4 — ABNORMAL HIGH (ref 0.8–1.2)
Prothrombin Time: 17.3 seconds — ABNORMAL HIGH (ref 11.4–15.2)

## 2022-12-11 LAB — GLUCOSE, CAPILLARY
Glucose-Capillary: 146 mg/dL — ABNORMAL HIGH (ref 70–99)
Glucose-Capillary: 117 mg/dL — ABNORMAL HIGH (ref 70–99)
Glucose-Capillary: 108 mg/dL — ABNORMAL HIGH (ref 70–99)
Glucose-Capillary: 223 mg/dL — ABNORMAL HIGH (ref 70–99)

## 2022-12-11 MED ORDER — WARFARIN SODIUM 5 MG PO TABS
7.5000 mg | ORAL_TABLET | Freq: Once | ORAL | Status: AC
  Administered 2022-12-11: 7.5 mg via ORAL
  Filled 2022-12-11: qty 1

## 2022-12-11 MED ORDER — PERFLUTREN LIPID MICROSPHERE
1.0000 mL | INTRAVENOUS | Status: AC | PRN
  Administered 2022-12-11: 2 mL via INTRAVENOUS

## 2022-12-11 NOTE — Progress Notes (Signed)
ANTICOAGULATION CONSULT NOTE - Follow Up Consult  Pharmacy Consult for Lovenox and warfarin Indication: hx DVT  Allergies  Allergen Reactions   Taxotere [Docetaxel] Swelling    Throat swelling    Patient Measurements: Height: 5\' 5"  (165.1 cm) Weight: 63.4 kg (139 lb 12.8 oz) IBW/kg (Calculated) : 57 Heparin Dosing Weight:   Vital Signs: Temp: 98.6 F (37 C) (05/20 0410) Temp Source: Oral (05/20 0410) BP: 112/75 (05/20 0410) Pulse Rate: 65 (05/20 0410)  Labs: Recent Labs    12/08/22 0823 12/09/22 0359 12/10/22 0324 12/11/22 0355  HGB 10.4*  --  8.5* 8.4*  HCT 32.9*  --  28.0* 28.7*  PLT 148*  --  135* 147*  LABPROT  --  15.7* 15.2 17.3*  INR  --  1.2 1.2 1.4*  HEPARINUNFRC 0.24*  --   --   --   CREATININE 2.67* 2.89* 2.90* 3.04*    Estimated Creatinine Clearance: 18.2 mL/min (A) (by C-G formula based on SCr of 3.04 mg/dL (H)).   Medications:  - PTA Fondaparinux 7.5mg  SQ daily (contraindicated per drug labeling with CrCl chronically < 30 ml/min). Last dose per patient 5/7.   Assessment: Patient is a 57 y.o F with hx CKD, nephrostomy tube, anemia, endometrial cancer and RLE DVT on fondaparinux PTA (failed Eliquis) who presented to the ED on 5//24 with c/o fever and generalized weakness.  She underwent left sided PCN exchange on 5/8. She was started on heparin drip on 11/30/22 and transitioned to lovenox/warfarin on 12/08/22.   Today, 12/11/2022: - INR is subtherapeutic at 1.4 - cbc stable - no bleeding documented - crcl <30 - drug-drug intxns: being on abx (meropenem) can make pt more sensitive to warfarin  Goal of Therapy:  INR 2-3 Monitor platelets by anticoagulation protocol: Yes   Plan:  - repeat warfarin 7.5 mg PO x1. Will plan on increasing dose on 5/21 if there's not a noticeable increase in INR tomorrow. - continue lovenox 1 mg/kg q24h until INR is therapeutic - daily INR - monitor for s/sx bleeding   Keyleen Cerrato P 12/11/2022,8:12 AM

## 2022-12-11 NOTE — Plan of Care (Signed)
  Problem: Coping: Goal: Ability to identify and develop effective coping behavior will improve Outcome: Progressing   Problem: Safety: Goal: Ability to remain free from injury will improve Outcome: Progressing   Problem: Pain Managment: Goal: General experience of comfort will improve Outcome: Progressing

## 2022-12-11 NOTE — Progress Notes (Signed)
Speech Language Pathology Treatment: Cognitive-Linquistic  Patient Details Name: Jaime Gomez MRN: [id number] DOB: Jun 14, 1966 Today's Date: 12/11/2022 Time: 0865-7846 SLP Time Calculation (min) (ACUTE ONLY): 25 min  Assessment / Plan / Recommendation Clinical Impression  Patient seen by SLP for skilled treatment focused on cognitive-linguistic function goals. Patient has reportedly improved significantly from initial evaluation completed by SLP on 12/07/22. She does continue with aphasia, however.  When SLP arrived, patient sitting on EOB, sister in room. Patient reports that she has improved significantly but she is still having word finding issues which is frustrating for her. She was oriented to place, situation, self, and fairly oriented to time as she knew month, day of week but stated year as "2025". In unstructured speech/communication, patient exhibited mild amount of phonemic errors and word finding errors but she was able to express self very well. She was able to follow one and two step commands without difficulty but with three step commands, she was accurate, but with some hesitation and uncertainty. She told SLP that "even TV shows, they are all new to me", indicating that she does not recall events from TV shows or movies she previously watched/followed. SLP showed patient app on phone called "Small Talk-Aphasia" that she could use and also encouraged her and her family to learn more about aphasia and consider an aphasia support group. SLP is recommending skilled SLP services at next venue of care.   HPI HPI: Jaime Gomez is a 57 y.o. 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 Cardiology consulted on 12/05/2022 for new CHF at the request of Dr.  Nelson Chimes.  Pt with AMS suddenly and MRI showed subacute left frontoparietal infarcts with  additional punctate acute infarcts in the cerebellum and bilateral occipital lobes. CXR concerning for potential left pna.     Swallow eval ordered.  Sister, Jaime Gomez, reports pt has no prior history of dysphagia.      SLP Plan  Continue with current plan of care      Recommendations for follow up therapy are one component of a multi-disciplinary discharge planning process, led by the attending physician.  Recommendations may be updated based on patient status, additional functional criteria and insurance authorization.    Recommendations   SLP services at next venue of care                      Intermittent Supervision/Assistance Aphasia (R47.01)     Continue with current plan of care     Jaime Nevin, MA, CCC-SLP Speech Therapy

## 2022-12-11 NOTE — Progress Notes (Signed)
  Echocardiogram 2D Echocardiogram has been performed.  Jaime Gomez Wynn Banker 12/11/2022, 4:30 PM

## 2022-12-11 NOTE — Progress Notes (Signed)
[facility] PROGRESS NOTE    Progress Note  Jaime Gomez  NWG:[id number] DOB: October 16, 1965 DOA: 11/28/2022 PCP: Staci Acosta, FNP     Brief Narrative:   Jaime Gomez is an 57 y.o. female past medical history significant for endometrial cancer on oral chemotherapy, chronic kidney disease stage III, iron deficiency anemia, essential hypertension diabetes mellitus type 2 history of right lower extremity DVT while on Arixtra, will with a left-sided nephrostomy tube exchange on 11/29/2022 patient admitted for sepsis due to UTI IR consulted nephrostomy tube was placed.  She is status post 2 units of packed red blood cells Hospital course was complicated by acute kidney injury which is slowly improving and further complicated by multifocal CVA concern for embolic versus septic, neurology infectious disease were consulted.    Significant events: Status post percutaneous tube exchange on 11/29/2022. Port has been removed on 12/04/2022. Port-A-Cath removed on 12/04/2022. Transesophageal echo did not show vegetation 12/04/2022.   Assessment/Plan:   Sepsis secondary to UTI (HCC)/Pseudomonas bacteremia: Blood cultures positive for Pseudomonas. Currently on IV meropenem.   ID recommended ciprofloxacin outpatient for 2 weeks. Not a candidate for TEE. Surveillance blood cultures on 11/30/2022 have been negative till date. Has remained afebrile. Physical therapy negative additional therapy evaluated the patient, she will need skilled nursing facility. She was ambulating in the hallway, will have physical therapy see her again today she might be able to go home with physical therapy  Newly diagnosed HFrEF/Takotsubo cardiomyopathy : Cardiology was consulted and is concerned about stress cardiomyopathy/Takotsubo syndrome. Likely due to Pseudomonas sepsis. Continue Coreg and BiDil Limited echo today 12/11/2022 is pending.  Acute/subacute CVA/acute encephalopathy: MRI of the brain showed multiple  infarcts. Neurology was consulted.  MRI showed no large vessel occlusions. EEG showed no seizures, encephalopathy possibly due to cefepime toxicity. Mentation back to baseline family is confirmed more likely due to cefepime toxicity than stroke.  Acute metabolic encephalopathy: Multifactorial likely due to cefepime toxicity. Cefepime has been discontinued. Her mentation is back to baseline, Family has confirmed  Acute kidney injury superimposed on chronic kidney disease stage IIIa: Baseline creatinine of around 2.6-3.0 on admission 4.9. Treated conservatively her creatinine has returned to baseline. Renal ultrasound showed chronic renal disease.  Hypomagnesemia/hypokalemia: Repeat check the potassium greater than 4 magnesium greater than 2.  Chronic right lower extremity DVT: Previously failed Eliquis as an outpatient now on Arixtra. Due to her renal dysfunction Arixtra was discontinued. Discussed with oncology will transition her to Lovenox and Coumadin overlap. Neurology and infectious disease if no contraindication to anticoagulation.  Depression: Continue sertraline at bedtime seen by psych.  Abnormal x-ray of the lung: Currently asymptomatic seen on previous x-ray continue to monitor.  Anemia of chronic disease: Response to units of packed red blood cells, hemoglobin this morning is 10.8. No signs of overt bleeding.  Diabetes mellitus type 2 with peripheral neuropathy: Blood glucose well-controlled continue sliding scale insulin.  Essential hypertension: Labetalol IV as needed not able to take orals. Continue Coreg.  Endometrial cancer stage IV: To resume outpatient chemotherapy as an outpatient follow-up with oncology.   DVT prophylaxis: none Family Communication:none Status is: Inpatient Remains inpatient appropriate because: Acute CVA Pseudomonas bacteremia    Code Status:     Code Status Orders  (From admission, onward)           Start      Ordered   11/28/22 1537  Full code  Continuous       Question:  By:  Answer:  Consent: discussion documented in EHR   11/28/22 1547           Code Status History     Date Active Date Inactive Code Status Order ID Comments User Context   09/01/2021 2140 09/05/2021 1404 Full Code [id number]  Synetta Fail, MD Inpatient         IV Access:   Peripheral IV   Procedures and diagnostic studies:   No results found.   Medical Consultants:   None.   Subjective:    Jaime Gomez in a good mood this morning, she has no new complaints.  Was able to ambulate the hall twice.  Objective:    Vitals:   12/10/22 1925 12/10/22 2000 12/10/22 2025 12/11/22 0410  BP:  (!) 112/59 (!) 95/59 112/75  Pulse:  83 88 65  Resp:  18 18 18   Temp: 99 F (37.2 C)  99.1 F (37.3 C) 98.6 F (37 C)  TempSrc: Oral  Oral Oral  SpO2:  97% 100% 98%  Weight:      Height:       SpO2: 98 %   Intake/Output Summary (Last 24 hours) at 12/11/2022 0754 Last data filed at 12/11/2022 0411 Gross per 24 hour  Intake 1080 ml  Output 250 ml  Net 830 ml    Filed Weights   11/28/22 0819 12/08/22 0957  Weight: 60.8 kg 63.4 kg    Exam: General exam: In no acute distress. Respiratory system: Good air movement and clear to auscultation. Cardiovascular system: S1 & S2 heard, RRR. No JVD. Gastrointestinal system: Abdomen is nondistended, soft and nontender.  Extremities: No pedal edema. Skin: No rashes, lesions or ulcers Psychiatry: Judgement and insight appear normal. Mood & affect appropriate.  Data Reviewed:    Labs: Basic Metabolic Panel: Recent Labs  Lab 12/05/22 0637 12/06/22 0359 12/07/22 1652 12/08/22 0823 12/09/22 0359 12/10/22 0324 12/11/22 0355  NA 143 146* 141 140 141 140 138  K 3.7 3.5 3.0* 3.7 4.5 4.7 4.8  CL 113* 113* 108 110 112* 110 109  CO2 16* 20* 19* 21* 21* 20* 21*  GLUCOSE 140* 113* 162* 166* 123* 117* 121*  BUN 64* 63* 50* 48* 52* 52* 53*  CREATININE 3.48*  3.19* 2.62* 2.67* 2.89* 2.90* 3.04*  CALCIUM 8.8* 9.0 8.3* 8.4* 8.4* 8.4* 8.3*  MG 1.8 1.8  --   --   --   --   --     GFR Estimated Creatinine Clearance: 18.2 mL/min (A) (by C-G formula based on SCr of 3.04 mg/dL (H)). Liver Function Tests: Recent Labs  Lab 12/08/22 0823 12/09/22 0359 12/10/22 0324 12/11/22 0355  AST 14* 12* 11* 12*  ALT 7 7 7 7   ALKPHOS 78 68 63 67  BILITOT 0.5 0.7 0.8 0.6  PROT 6.3* 5.8* 5.7* 5.9*  ALBUMIN 2.3* 2.2* 2.2* 2.2*    No results for input(s): "LIPASE", "AMYLASE" in the last 168 hours. No results for input(s): "AMMONIA" in the last 168 hours.  Coagulation profile Recent Labs  Lab 12/09/22 0359 12/10/22 0324 12/11/22 0355  INR 1.2 1.2 1.4*    COVID-19 Labs  No results for input(s): "DDIMER", "FERRITIN", "LDH", "CRP" in the last 72 hours.  Lab Results  Component Value Date   SARSCOV2NAA NEGATIVE 11/28/2022   SARSCOV2NAA NEGATIVE 09/01/2021    CBC: Recent Labs  Lab 12/05/22 0637 12/06/22 0359 12/08/22 0823 12/10/22 0324 12/11/22 0355  WBC 5.5 4.7 5.5 4.5 4.4  NEUTROABS  --   --   --  3.1 2.9  HGB 11.3* 10.8* 10.4* 8.5* 8.4*  HCT 34.9* 34.3* 32.9* 28.0* 28.7*  MCV 89.5 91.0 91.1 94.3 98.0  PLT 208 186 148* 135* 147*    Cardiac Enzymes: No results for input(s): "CKTOTAL", "CKMB", "CKMBINDEX", "TROPONINI" in the last 168 hours. BNP (last 3 results) No results for input(s): "PROBNP" in the last 8760 hours. CBG: Recent Labs  Lab 12/09/22 2147 12/10/22 0827 12/10/22 1140 12/10/22 1626 12/10/22 2054  GLUCAP 117* 116* 142* 163* 161*    D-Dimer: No results for input(s): "DDIMER" in the last 72 hours. Hgb A1c: No results for input(s): "HGBA1C" in the last 72 hours. Lipid Profile: No results for input(s): "CHOL", "HDL", "LDLCALC", "TRIG", "CHOLHDL", "LDLDIRECT" in the last 72 hours.  Thyroid function studies: No results for input(s): "TSH", "T4TOTAL", "T3FREE", "THYROIDAB" in the last 72 hours.  Invalid input(s):  "FREET3"  Anemia work up: No results for input(s): "VITAMINB12", "FOLATE", "FERRITIN", "TIBC", "IRON", "RETICCTPCT" in the last 72 hours.  Sepsis Labs: Recent Labs  Lab 12/06/22 0359 12/08/22 0823 12/10/22 0324 12/11/22 0355  WBC 4.7 5.5 4.5 4.4    Microbiology No results found for this or any previous visit (from the past 240 hour(s)).    Medications:    acetaminophen  1,000 mg Oral Once   carvedilol  12.5 mg Oral BID WC   Chlorhexidine Gluconate Cloth  6 each Topical Daily   enoxaparin (LOVENOX) injection  60 mg Subcutaneous Q24H   feeding supplement  1 Container Oral BID BM   folic acid  1 mg Oral Q0600   gabapentin  300 mg Oral TID   insulin aspart  0-9 Units Subcutaneous TID WC   isosorbide-hydrALAZINE  1 tablet Oral TID   melatonin  3 mg Oral QHS   mirtazapine  7.5 mg Oral QHS   oxybutynin  10 mg Oral Daily   sodium bicarbonate  650 mg Oral TID   Warfarin - Pharmacist Dosing Inpatient   Does not apply q1600   Continuous Infusions:  meropenem (MERREM) IV 1 g (12/10/22 2213)      LOS: 13 days   Marinda Elk  [facility]  12/11/2022, 7:54 AM

## 2022-12-11 NOTE — Progress Notes (Signed)
Occupational Therapy Treatment Patient Details Name: Jaime Gomez MRN: [id number] DOB: 07/10/1966 Today's Date: 12/11/2022   History of present illness Patient is a 57 year old female who presented on 5/7 with left side nephrostomy tube that was due to be replaced on 5/8. Patient was admitted with sepsis,and UTI. Patient was noted to have nephrostomy tube changed with anemia post op with 2 units of PRBCs. 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 Port a cath has been removed.  PMH:  endometrial cancer , CKD , chronic iron deficiency anemia, Hypertension, DM II, RLE DVT.   OT comments  Pt making good progress with functional goals. The pt is eager to return home. Pt's sister present and reports that she will have 24/7 caregivers with family and plan to hire. OT will continue to follow acutely to maximize level of function and safety   Recommendations for follow up therapy are one component of a multi-disciplinary discharge planning process, led by the attending physician.  Recommendations may be updated based on patient status, additional functional criteria and insurance authorization.    Assistance Recommended at Discharge Frequent or constant Supervision/Assistance  Patient can return home with the following  Direct supervision/assist for medications management;A lot of help with bathing/dressing/bathroom;A lot of help with walking and/or transfers;Assistance with cooking/housework   Equipment Recommendations  Tub/shower seat    Recommendations for Other Services      Precautions / Restrictions Precautions Precautions: Fall Precaution Comments: Lt neph tube Restrictions Weight Bearing Restrictions: No       Mobility Bed Mobility               General bed mobility comments: pt sitting EOB  upon arrival    Transfers Overall transfer level: Needs assistance Equipment used: 1 person hand held assist Transfers: Sit to/from Stand Sit to Stand: Min guard                 Balance Overall balance assessment: Needs assistance Sitting-balance support: No upper extremity supported, Feet supported Sitting balance-Leahy Scale: Good     Standing balance support: Single extremity supported, During functional activity Standing balance-Leahy Scale: Poor                             ADL either performed or assessed with clinical judgement   ADL Overall ADL's : Needs assistance/impaired     Grooming: Wash/dry hands;Wash/dry face;Min guard;Standing;With caregiver independent assisting   Upper Body Bathing: Set up;Sitting;With caregiver independent assisting   Lower Body Bathing: Min guard;With caregiver independent assisting   Upper Body Dressing : Set up;Sitting;With caregiver independent assisting   Lower Body Dressing: Min guard;With caregiver independent assisting   Toilet Transfer: Min guard;Ambulation;With caregiver independent assisting   Toileting- Architect and Hygiene: Min guard;Sit to/from stand       Functional mobility during ADLs: Min guard      Extremity/Trunk Assessment Upper Extremity Assessment Upper Extremity Assessment: Overall WFL for tasks assessed   Lower Extremity Assessment Lower Extremity Assessment: Defer to PT evaluation        Vision Ability to See in Adequate Light: 0 Adequate Patient Visual Report: No change from baseline     Perception     Praxis      Cognition Arousal/Alertness: Awake/alert Behavior During Therapy: [facility] for  tasks assessed/performed Overall Cognitive Status: Within Functional Limits for tasks assessed                                          Exercises      Shoulder Instructions       General Comments      Pertinent Vitals/ Pain       Pain  Assessment Pain Assessment: Faces Faces Pain Scale: Hurts a little bit Pain Location: feet, neuropathy Pain Descriptors / Indicators: Discomfort Pain Intervention(s): Monitored during session, Repositioned  Home Living                                          Prior Functioning/Environment              Frequency  Min 1X/week        Progress Toward Goals  OT Goals(current goals can now be found in the care plan section)  Progress towards OT goals: Progressing toward goals     Plan Discharge plan needs to be updated    Co-evaluation                 AM-PAC OT "6 Clicks" Daily Activity     Outcome Measure   Help from another person eating meals?: None Help from another person taking care of personal grooming?: A Little Help from another person toileting, which includes using toliet, bedpan, or urinal?: A Little Help from another person bathing (including washing, rinsing, drying)?: A Little Help from another person to put on and taking off regular upper body clothing?: A Little Help from another person to put on and taking off regular lower body clothing?: A Little 6 Click Score: 19    End of Session  Pt sitting EOB, call light within reach, sister present  OT Visit Diagnosis: Unsteadiness on feet (R26.81);Other abnormalities of gait and mobility (R26.89)   Activity Tolerance Patient tolerated treatment well   Patient Left in bed;with call bell/phone within reach   Nurse Communication          Time: 4098-1191 OT Time Calculation (min): 22 min  Charges: OT General Charges $OT Visit: 1 Visit OT Treatments $Therapeutic Activity: 8-22 mins    Galen Manila 12/11/2022, 1:05 PM

## 2022-12-11 NOTE — Progress Notes (Signed)
Received report on patient from off-going RN. RN agrees with patient's most recent assessment and will resume patient's care until 1900. Will continue to monitor patient for any changes for the rest of the shift.

## 2022-12-11 NOTE — Progress Notes (Addendum)
Progress Note  Patient Name: Jaime Gomez Date of Encounter: 12/11/2022  Primary Cardiologist: Thurmon Fair, MD  Subjective   Feeling great today. No CP or SOB. Has continued RLE edema (has h/o chronic RLE DVT).  Inpatient Medications    Scheduled Meds:  acetaminophen  1,000 mg Oral Once   carvedilol  12.5 mg Oral BID WC   Chlorhexidine Gluconate Cloth  6 each Topical Daily   enoxaparin (LOVENOX) injection  60 mg Subcutaneous Q24H   feeding supplement  1 Container Oral BID BM   folic acid  1 mg Oral Q0600   gabapentin  300 mg Oral TID   insulin aspart  0-9 Units Subcutaneous TID WC   isosorbide-hydrALAZINE  1 tablet Oral TID   melatonin  3 mg Oral QHS   mirtazapine  7.5 mg Oral QHS   oxybutynin  10 mg Oral Daily   sodium bicarbonate  650 mg Oral TID   Warfarin - Pharmacist Dosing Inpatient   Does not apply q1600   Continuous Infusions:  meropenem (MERREM) IV 1 g (12/10/22 2213)   PRN Meds: acetaminophen **OR** acetaminophen, guaiFENesin, haloperidol lactate, hydrALAZINE, HYDROmorphone (DILAUDID) injection, ipratropium-albuterol, labetalol, LORazepam, methocarbamol, OLANZapine, ondansetron **OR** ondansetron (ZOFRAN) IV, mouth rinse, oxyCODONE, phenol, polyethylene glycol, senna-docusate, sorbitol   Vital Signs    Vitals:   12/10/22 1925 12/10/22 2000 12/10/22 2025 12/11/22 0410  BP:  (!) 112/59 (!) 95/59 112/75  Pulse:  83 88 65  Resp:  18 18 18   Temp: 99 F (37.2 C)  99.1 F (37.3 C) 98.6 F (37 C)  TempSrc: Oral  Oral Oral  SpO2:  97% 100% 98%  Weight:      Height:        Intake/Output Summary (Last 24 hours) at 12/11/2022 0742 Last data filed at 12/11/2022 0411 Gross per 24 hour  Intake 1080 ml  Output 250 ml  Net 830 ml      12/08/2022    9:57 AM 12/03/2022    7:07 PM 11/28/2022    8:19 AM  Last 3 Weights  Weight (lbs) 139 lb 12.8 oz 134 lb 134 lb  Weight (kg) 63.413 kg 60.782 kg 60.782 kg     Telemetry    Not on telemetry - Personally  Reviewed  Physical Exam   GEN: No acute distress.  HEENT: Normocephalic, atraumatic, sclera non-icteric. Neck: No JVD or bruits. Cardiac: RRR no murmurs, rubs, or gallops.  Respiratory: Clear to auscultation bilaterally. Breathing is unlabored. GI: Soft, nontender, non-distended, BS +x 4. MS: no deformity. Extremities: No clubbing or cyanosis. + Mild RLE edema. Distal pedal pulses are 2+ and equal bilaterally. Neuro:  AAOx3. Follows commands. Psych:  Responds to questions appropriately with a normal affect.  Labs    High Sensitivity Troponin:  No results for input(s): "TROPONINIHS" in the last 720 hours.    Cardiac EnzymesNo results for input(s): "TROPONINI" in the last 168 hours. No results for input(s): "TROPIPOC" in the last 168 hours.   Chemistry Recent Labs  Lab 12/09/22 0359 12/10/22 0324 12/11/22 0355  NA 141 140 138  K 4.5 4.7 4.8  CL 112* 110 109  CO2 21* 20* 21*  GLUCOSE 123* 117* 121*  BUN 52* 52* 53*  CREATININE 2.89* 2.90* 3.04*  CALCIUM 8.4* 8.4* 8.3*  PROT 5.8* 5.7* 5.9*  ALBUMIN 2.2* 2.2* 2.2*  AST 12* 11* 12*  ALT 7 7 7   ALKPHOS 68 63 67  BILITOT 0.7 0.8 0.6  GFRNONAA 18* 18* 17*  ANIONGAP 8  10 8     Hematology Recent Labs  Lab 12/08/22 0823 12/10/22 0324 12/11/22 0355  WBC 5.5 4.5 4.4  RBC 3.61* 2.97* 2.93*  HGB 10.4* 8.5* 8.4*  HCT 32.9* 28.0* 28.7*  MCV 91.1 94.3 98.0  MCH 28.8 28.6 28.7  MCHC 31.6 30.4 29.3*  RDW 16.5* 17.0* 17.3*  PLT 148* 135* 147*    BNPNo results for input(s): "BNP", "PROBNP" in the last 168 hours.   DDimer No results for input(s): "DDIMER" in the last 168 hours.   Radiology    No results found.  Cardiac Studies   2d echo 12/04/22   1. Global hypokinesis with apical akinesis; overall moderate LV  dysfunction.   2. Left ventricular ejection fraction, by estimation, is 30 to 35%. The  left ventricle has moderately decreased function. The left ventricle  demonstrates regional wall motion abnormalities  (see scoring  diagram/findings for description). The left  ventricular internal cavity size was mildly dilated. Left ventricular  diastolic parameters are consistent with Grade I diastolic dysfunction  (impaired relaxation).   3. Right ventricular systolic function is normal. The right ventricular  size is normal.   4. The mitral valve is normal in structure. Trivial mitral valve  regurgitation. No evidence of mitral stenosis.   5. The aortic valve is tricuspid. Aortic valve regurgitation is not  visualized. No aortic stenosis is present.   6. The inferior vena cava is normal in size with greater than 50%  respiratory variability, suggesting right atrial pressure of 3 mmHg.   Comparison(s): No prior Echocardiogram.    Patient Profile     56 y.o. female with hypertension, type 2 diabetes mellitus, CKD stage IV with bilateral hydronephrosis s/p left nephrostomy tube, chronic right lower extremity DVT on Arixtra, stage IV endometrial cancer on Afinitor/ Letrozole, chronic iron deficiency anemia requiring periodic transfusions of iron/PRBCs. She was admitted 11/28/22 for fever and weakness, found to be hypotensive and tachycardic. She was found to have sepsis due to UTI with pseudomonas bacteremia, AKI superimposed on CKD requiring nephrostomy exchange, hospital course complicated by encephalopathy (possibly due to cefepime toxicity), acute vs subacute CVA, and a/c anemia requiring transfusion. . Neurology was consulted and felt etiology was likely cardioembolic vs secondary hypercoagulable state of malignancy. Cardiology consulted for LVEF 30-35%. IM, oncology, ID, neurology following.   Assessment & Plan    1. Newly diagnosed HFrEF - most likely dx is felt stress cardiomyopathy/Takotsubo syndrome with numerous stressors lately - not only numerous serious comorbid conditions and bad news, but also underlying stress of medical illness with sepsis - appears euvolemic on exam - current rx:  carvedilol 12.5mg  BID, Bidil 20/37.5mg  TID -> BP prohibits further med titration at this time but seems to be tolerating current regimen - holding on ACEI, ARB, ARNI, spiro given advanced renal disease - f/u limited echo today - also discussed monitoring for worsening HF symptoms or weight gain after DC  2. Bacteremia, sepsis due to UTI - per prior cardiology, neuro notes, low index of suspicion for endocarditis - abx per primary team  3. Acute vs subacute stroke - neurology had been involved, no large vessel occlusions, question cardioembolic versus related to hypercoagulability in malignancy - previously failed Eliquis as an outpatient, on Atrixa PTA - per IM's discussion with oncology, patient to be transitioned to Lovenox-Coumadin overlap - will defer to oncology to follow anticoagulation given this is not specifically for cardiac reasons  F/u scheduled 6/10 with cardiology APP, appt outlined on AVS  For  questions or updates, please contact [facility] Please consult [email or web address] for contact info under Cardiology/STEMI.  Signed, Laurann Montana, PA-C 12/11/2022, 7:42 AM    Patient seen and examined, note reviewed with the signed Advanced Practice Provider. I personally reviewed laboratory data, imaging studies and relevant notes. I independently examined the patient and formulated the important aspects of the plan. I have personally discussed the plan with the patient and/or family. Comments or changes to the note/plan are indicated below.  Heart failure with reduced ejection fraction suspect stress related cardiomyopathy/Takotsubo Bacteremia in the setting of sepsis due to UTI Stroke: Acute versus subacute  Repeat echo pending today.  In terms of medical therapy continue patient on carvedilol 12.5 mg twice daily, BiDil 20/37.5 mg 3 times daily.  At this time her blood pressure cannot allow for any further titration of guideline medical directed therapy. In the setting of her  chronic kidney disease we will hold off on ACE, ARB and Arni at this time.  Plan follow-up on June 10 with cardiology.  Thomasene Ripple DO, MS [facility] Attending Cardiologist [facility]  [address] [place], [place] [address] [phone] Website: [email or web address]

## 2022-12-11 NOTE — Progress Notes (Signed)
Heart Failure Navigator Progress Note  Assessed for Heart & Vascular TOC clinic readiness.  Patient EF 30-35%, Stage IV endometrial Cancer, MRI showed subacute left frontoparietal infarcts with additional punctate acute infarcts in the cerebellum and bilateral occipital lobes. Expressive aphasia,  bilateral hydronephrosis s/p left nephrostomy tube, Will go home with family support and PT/OT to help with being deconditioned. Patient has a [facility] hospital follow up scheduled for 01/01/2023.    Navigator available for reassessment of patient.   Rhae Hammock, BSN, Scientist, clinical (histocompatibility and immunogenetics) Only

## 2022-12-11 NOTE — TOC Progression Note (Signed)
Transition of Care [facility]) - Progression Note    Patient Details  Name: Jaime Gomez MRN: [id number] Date of Birth: [birth year]  Transition [facility]) CM/SW Contact  Howell Rucks, RN Phone Number: 12/11/2022, 10:51 AM  Clinical Narrative:   PT recommendation changed to [facility] PT/OT. Met with pt at bedside, no preference. HH PT/OT [facility]- rep Amy. Will continue to follow.     Expected Discharge Plan: Home/Self Care Barriers to Discharge: Continued Medical Work up  Expected Discharge Plan and Services   Discharge Planning Services: CM Consult                                           Social Determinants of Health (SDOH) Interventions SDOH Screenings   Food Insecurity: No Food Insecurity (11/28/2022)  Housing: Low Risk  (11/28/2022)  Transportation Needs: No Transportation Needs (11/28/2022)  Utilities: Not At Risk (11/28/2022)  Depression (PHQ2-9): Low Risk  (09/18/2022)  Financial Resource Strain: Low Risk  (06/27/2022)  Stress: No Stress Concern Present (06/27/2022)  Tobacco Use: Low Risk  (12/04/2022)    Readmission Risk Interventions    11/30/2022   10:29 AM  Readmission Risk Prevention Plan  Transportation Screening Complete  PCP or Specialist Appt within 3-5 Days Complete  HRI or Home Care Consult Complete  Social Work Consult for Recovery Care Planning/Counseling Complete  Palliative Care Screening Not Applicable  Medication Review Oceanographer) Complete

## 2022-12-11 NOTE — Progress Notes (Signed)
Jaime Gomez looks great today.  Hopefully, she is going home.  Her labs look wonderful.  Her BUN is 53 creatinine 3.04.  She is on Coumadin.  Her white cell count is 4.4.  Hemoglobin 8.4.  Platelet count 147,000.  Her INR is 1.4.  I would think that given her renal insufficiency, this can be very challenging to manage the INR.  She seems to be back to her baseline.  She seems to be relatively lower and oriented.  It was nice talking to her again.  She said that she did walk a little bit.  I know that she will need quite a bit of physical therapy and Occupational Therapy.  She is eating.  There is no nausea or vomiting.  She is not having any diarrhea.  The nephrostomy is working.  Her vital signs are temperature of 98.6.  Pulse 65.  Blood pressure 112/75.  Her lungs sound clear bilaterally.  She has good breath sounds bilaterally.  She has good air movement.  Cardiac exam regular rate and rhythm.  She has 1/6 systolic ejection murmur.  Abdomen soft.  Bowel sounds are present.  There is no fluid wave.  There is no guarding or rebound tenderness.  There is no palpable hepatosplenomegaly.  Extremities shows no clubbing, cyanosis or edema.  Neurological exam shows no focal neurological deficits.  I am just so happy that she has recovered her cognitive function.  Again, I have never seen such neurologic toxicity from antibiotics.  She really has improved over the past week.  She does not have a Port-A-Cath in.  Will have to think about getting 1 put back in probably in a couple weeks.  I suspect that she probably will need to be transfused at some point.  If she goes home today, I am sure we will follow her up as an outpatient.  I know the staff on [address] have done a tremendous job with her.   [name], MD  Romans 5:3-5

## 2022-12-11 NOTE — Progress Notes (Signed)
Physical Therapy Treatment Patient Details Name: Jaime Gomez MRN: [id number] DOB: 05-Oct-1965 Today's Date: 12/11/2022   History of Present Illness Patient is a 57 year old female who presented on 5/7 with left side nephrostomy tube that was due to be replaced on 5/8. Patient was admitted with sepsis,and UTI. Patient was noted to have nephrostomy tube changed with anemia post op with 2 units of PRBCs. 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 Port a cath has been removed.  PMH:  endometrial cancer , CKD , chronic iron deficiency anemia, Hypertension, DM II, RLE DVT.    PT Comments    The patient is eager to ambulate and return home. Patient's sister  present and reports that patient will have 24/7 caregivers with family and plan to hire.  Patient  is min  assist with HHA ambulation. Strongly recommend to patient and sister that patient should use RW at this time, they agreed.    Recommendations for follow up therapy are one component of a multi-disciplinary discharge planning process, led by the attending physician.  Recommendations may be updated based on patient status, additional functional criteria and insurance authorization.  Follow Up Recommendations       Assistance Recommended at Discharge Frequent or constant Supervision/Assistance  Patient can return home with the following A little help with walking and/or transfers;A little help with bathing/dressing/bathroom;Assist for transportation;Help with stairs or ramp for entrance   Equipment Recommendations  None recommended by PT    Recommendations for Other Services       Precautions / Restrictions Precautions Precautions: Fall Precaution Comments: Lt neph tube     Mobility  Bed Mobility Overal bed mobility: Independent                   Transfers   Equipment used: 1 person hand held assist Transfers: Sit to/from Stand Sit to Stand: Min guard                Ambulation/Gait Ambulation/Gait assistance: Editor, commissioning (Feet): 150 Feet Assistive device: 1 person hand held assist Gait Pattern/deviations: Step-through pattern, Decreased step length - right, Decreased step length - left, Drifts right/left Gait velocity: decreased     General Gait Details: gait is slow, wavers at times, requires  HHA.   Stairs             Wheelchair Mobility    Modified Rankin (Stroke Patients Only)       Balance   Sitting-balance support: No upper extremity supported, Feet supported Sitting balance-Leahy Scale: Good     Standing balance support: Single extremity supported, During functional activity Standing balance-Leahy Scale: Poor Standing balance comment: stands static  with no LOB, does demonstrate dynamic balance loss.                            Cognition Arousal/Alertness: Awake/alert Behavior During Therapy: WFL for tasks assessed/performed                           Following Commands: Follows one step commands consistently                Exercises      General Comments        Pertinent Vitals/Pain Pain Assessment  Faces Pain Scale: Hurts a little bit Pain Location: feet, neuropathy    Home Living                          Prior Function            PT Goals (current goals can now be found in the care plan section) Progress towards PT goals: Progressing toward goals    Frequency    Min 1X/week      PT Plan Discharge plan needs to be updated    Co-evaluation              AM-PAC PT "6 Clicks" Mobility   Outcome Measure  Help needed turning from your back to your side while in a flat bed without using bedrails?: None Help needed moving from lying on your back to sitting on the side of a flat bed without using  bedrails?: None Help needed moving to and from a bed to a chair (including a wheelchair)?: A Little Help needed standing up from a chair using your arms (e.g., wheelchair or bedside chair)?: A Little Help needed to walk in hospital room?: A Little Help needed climbing 3-5 steps with a railing? : A Lot 6 Click Score: 19    End of Session Equipment Utilized During Treatment: Gait belt Activity Tolerance: Patient tolerated treatment well Patient left: in bed;with call bell/phone within reach;with bed alarm set;with family/visitor present Nurse Communication: Mobility status;Other (comment) PT Visit Diagnosis: Other abnormalities of gait and mobility (R26.89)     Time: 1610-9604 PT Time Calculation (min) (ACUTE ONLY): 10 min  Charges:  $Gait Training: 8-22 mins                     Blanchard Kelch PT Acute Rehabilitation Services Office [phone] Weekend pager-[phone]    Rada Hay 12/11/2022, 8:56 AM

## 2022-12-12 ENCOUNTER — Other Ambulatory Visit (HOSPITAL_COMMUNITY): Payer: Self-pay

## 2022-12-12 ENCOUNTER — Encounter: Payer: Self-pay | Admitting: Family

## 2022-12-12 DIAGNOSIS — E119 Type 2 diabetes mellitus without complications: Secondary | ICD-10-CM | POA: Diagnosis not present

## 2022-12-12 DIAGNOSIS — N179 Acute kidney failure, unspecified: Secondary | ICD-10-CM | POA: Diagnosis not present

## 2022-12-12 DIAGNOSIS — A419 Sepsis, unspecified organism: Secondary | ICD-10-CM | POA: Diagnosis not present

## 2022-12-12 DIAGNOSIS — N1831 Chronic kidney disease, stage 3a: Secondary | ICD-10-CM

## 2022-12-12 DIAGNOSIS — T83512A Infection and inflammatory reaction due to nephrostomy catheter, initial encounter: Secondary | ICD-10-CM

## 2022-12-12 DIAGNOSIS — R6520 Severe sepsis without septic shock: Secondary | ICD-10-CM | POA: Diagnosis not present

## 2022-12-12 DIAGNOSIS — N390 Urinary tract infection, site not specified: Secondary | ICD-10-CM | POA: Diagnosis not present

## 2022-12-12 LAB — CBC WITH DIFFERENTIAL/PLATELET
Abs Immature Granulocytes: 0.06 10*3/uL (ref 0.00–0.07)
Basophils Absolute: 0 10*3/uL (ref 0.0–0.1)
Basophils Relative: 1 %
Eosinophils Absolute: 0.1 10*3/uL (ref 0.0–0.5)
Eosinophils Relative: 3 %
HCT: 28.5 % — ABNORMAL LOW (ref 36.0–46.0)
Hemoglobin: 8.6 g/dL — ABNORMAL LOW (ref 12.0–15.0)
Immature Granulocytes: 2 %
Lymphocytes Relative: 22 %
Lymphs Abs: 0.9 10*3/uL (ref 0.7–4.0)
MCH: 28.6 pg (ref 26.0–34.0)
MCHC: 30.2 g/dL (ref 30.0–36.0)
MCV: 94.7 fL (ref 80.0–100.0)
Monocytes Absolute: 0.5 10*3/uL (ref 0.1–1.0)
Monocytes Relative: 11 %
Neutro Abs: 2.5 10*3/uL (ref 1.7–7.7)
Neutrophils Relative %: 61 %
Platelets: 141 10*3/uL — ABNORMAL LOW (ref 150–400)
RBC: 3.01 MIL/uL — ABNORMAL LOW (ref 3.87–5.11)
RDW: 17.1 % — ABNORMAL HIGH (ref 11.5–15.5)
WBC: 4 10*3/uL (ref 4.0–10.5)
nRBC: 0 % (ref 0.0–0.2)

## 2022-12-12 LAB — COMPREHENSIVE METABOLIC PANEL
ALT: 5 U/L (ref 0–44)
AST: 12 U/L — ABNORMAL LOW (ref 15–41)
Albumin: 2.2 g/dL — ABNORMAL LOW (ref 3.5–5.0)
Alkaline Phosphatase: 75 U/L (ref 38–126)
Anion gap: 11 (ref 5–15)
BUN: 55 mg/dL — ABNORMAL HIGH (ref 6–20)
CO2: 21 mmol/L — ABNORMAL LOW (ref 22–32)
Calcium: 8.1 mg/dL — ABNORMAL LOW (ref 8.9–10.3)
Chloride: 106 mmol/L (ref 98–111)
Creatinine, Ser: 3.21 mg/dL — ABNORMAL HIGH (ref 0.44–1.00)
GFR, Estimated: 16 mL/min — ABNORMAL LOW (ref >60)
Glucose, Bld: 107 mg/dL — ABNORMAL HIGH (ref 70–99)
Potassium: 4.9 mmol/L (ref 3.5–5.1)
Sodium: 138 mmol/L (ref 135–145)
Total Bilirubin: 0.6 mg/dL (ref 0.3–1.2)
Total Protein: 6.1 g/dL — ABNORMAL LOW (ref 6.5–8.1)

## 2022-12-12 LAB — PROTIME-INR
INR: 1.7 — ABNORMAL HIGH (ref 0.8–1.2)
Prothrombin Time: 20.1 seconds — ABNORMAL HIGH (ref 11.4–15.2)

## 2022-12-12 LAB — VITAMIN B1: Vitamin B1 (Thiamine): 83.4 nmol/L (ref 66.5–200.0)

## 2022-12-12 LAB — GLUCOSE, CAPILLARY: Glucose-Capillary: 157 mg/dL — ABNORMAL HIGH (ref 70–99)

## 2022-12-12 MED ORDER — WARFARIN SODIUM 5 MG PO TABS
5.0000 mg | ORAL_TABLET | Freq: Once | ORAL | Status: AC
  Administered 2022-12-12: 5 mg via ORAL
  Filled 2022-12-12: qty 1

## 2022-12-12 MED ORDER — WARFARIN SODIUM 5 MG PO TABS
5.0000 mg | ORAL_TABLET | Freq: Once | ORAL | 0 refills | Status: DC
  Filled 2022-12-12: qty 15, 15d supply, fill #0

## 2022-12-12 MED ORDER — WARFARIN SODIUM 5 MG PO TABS
5.0000 mg | ORAL_TABLET | Freq: Every day | ORAL | 0 refills | Status: DC
  Filled 2022-12-12: qty 30, 30d supply, fill #0

## 2022-12-12 MED ORDER — CIPROFLOXACIN HCL 500 MG PO TABS
750.0000 mg | ORAL_TABLET | Freq: Every day | ORAL | 0 refills | Status: AC
  Filled 2022-12-12: qty 8, 5d supply, fill #0

## 2022-12-12 MED ORDER — CIPROFLOXACIN HCL 500 MG PO TABS
750.0000 mg | ORAL_TABLET | Freq: Two times a day (BID) | ORAL | 0 refills | Status: DC
  Filled 2022-12-12: qty 15, 5d supply, fill #0

## 2022-12-12 MED ORDER — ENOXAPARIN SODIUM 60 MG/0.6ML IJ SOSY
60.0000 mg | PREFILLED_SYRINGE | INTRAMUSCULAR | 0 refills | Status: DC
  Filled 2022-12-12: qty 1.2, 2d supply, fill #0

## 2022-12-12 MED ORDER — CARVEDILOL 12.5 MG PO TABS: 12.5000 mg | ORAL_TABLET | Freq: Two times a day (BID) | ORAL | Status: DC

## 2022-12-12 MED ORDER — INSULIN ASPART 100 UNIT/ML FLEXPEN
2.0000 [IU] | PEN_INJECTOR | Freq: Three times a day (TID) | SUBCUTANEOUS | 11 refills | Status: DC
  Filled 2022-12-12: qty 3, 30d supply, fill #0

## 2022-12-12 MED ORDER — GABAPENTIN 300 MG PO CAPS
300.0000 mg | ORAL_CAPSULE | Freq: Three times a day (TID) | ORAL | 3 refills | Status: DC
Start: 2022-12-12 — End: 2022-12-12
  Filled 2022-12-12: qty 60, 20d supply, fill #0

## 2022-12-12 MED ORDER — GABAPENTIN 300 MG PO CAPS
300.0000 mg | ORAL_CAPSULE | Freq: Two times a day (BID) | ORAL | 3 refills | Status: DC
Start: 2022-12-12 — End: 2022-12-15
  Filled 2022-12-12: qty 60, 30d supply, fill #0

## 2022-12-12 NOTE — Progress Notes (Signed)
Looks like Ms. Porrata will go home today.  She apparently need to have another echocardiogram done.  She walked yesterday.  She ate well yesterday.  She is currently is alert and oriented.  She is back to her baseline cognitive state.  I know that cardiology is helping to manage the congestive heart failure.  This will certainly impact how we try to treat her uterine cancer in the future.  She is on Coumadin now.  Her INR today is 1.7.  Her labs show a white count of 4000.  Hemoglobin 8.6.  Platelet count 141,000.  Her sodium 138.  Potassium 4.9.  BUN 55 creatinine 3.21.  Of note, her creatinine has been slowly going up.  Her albumin is 2.2.  She has had no problems with pain outside of that with her feet.  I am sure she has neuropathy from diabetes and also from past chemotherapy.  She has had no bleeding.  She has had no cough or shortness of breath.  Her vital signs show temperature of 98.3.  Pulse 75.  Blood pressure 98/57.  Head neck exam shows no ocular or oral lesions.  There are no palpable cervical or supraclavicular lymph nodes.  Lungs are clear bilaterally.  Cardiac exam regular rate and rhythm.  Abdomen soft.  Bowel sounds present.  There is no fluid wave.  There is no palpable abdominal mass.  There is no hepatosplenomegaly.  Extremity shows no clubbing, cyanosis or edema.  Neurological exam shows no focal deficits.   Hopefully, Ms. Carringer will go home.  Her INR still not therapeutic.  She is on Lovenox.  We will have to just follow her up as an outpatient.    [name], MD  Jeri Modena 33:6

## 2022-12-12 NOTE — Progress Notes (Addendum)
Progress Note  Patient Name: Jaime Gomez Date of Encounter: 12/12/2022  Primary Cardiologist: Thurmon Fair, MD  Subjective   Feeling well this morning, no complaints. BP soft by last check, 98/57, but no symptoms.  Inpatient Medications    Scheduled Meds:  acetaminophen  1,000 mg Oral Once   carvedilol  12.5 mg Oral BID WC   enoxaparin (LOVENOX) injection  60 mg Subcutaneous Q24H   feeding supplement  1 Container Oral BID BM   folic acid  1 mg Oral Q0600   gabapentin  300 mg Oral TID   insulin aspart  0-9 Units Subcutaneous TID WC   isosorbide-hydrALAZINE  1 tablet Oral TID   melatonin  3 mg Oral QHS   mirtazapine  7.5 mg Oral QHS   oxybutynin  10 mg Oral Daily   sodium bicarbonate  650 mg Oral TID   Warfarin - Pharmacist Dosing Inpatient   Does not apply q1600   Continuous Infusions:  meropenem (MERREM) IV Stopped (12/11/22 2251)   PRN Meds: acetaminophen **OR** acetaminophen, guaiFENesin, haloperidol lactate, hydrALAZINE, HYDROmorphone (DILAUDID) injection, ipratropium-albuterol, labetalol, LORazepam, methocarbamol, OLANZapine, ondansetron **OR** ondansetron (ZOFRAN) IV, mouth rinse, oxyCODONE, phenol, polyethylene glycol, senna-docusate, sorbitol   Vital Signs    Vitals:   12/11/22 1300 12/11/22 1627 12/11/22 2025 12/12/22 0539  BP: 124/73 124/73 (!) 102/57 (!) 98/57  Pulse: 72 79 82 75  Resp: 18  19 18   Temp: 98.6 F (37 C)  98.3 F (36.8 C) 98.3 F (36.8 C)  TempSrc: Oral  Oral Oral  SpO2: 98%  95% 96%  Weight:      Height:        Intake/Output Summary (Last 24 hours) at 12/12/2022 0741 Last data filed at 12/12/2022 0600 Gross per 24 hour  Intake 1040 ml  Output 850 ml  Net 190 ml      12/08/2022    9:57 AM 12/03/2022    7:07 PM 11/28/2022    8:19 AM  Last 3 Weights  Weight (lbs) 139 lb 12.8 oz 134 lb 134 lb  Weight (kg) 63.413 kg 60.782 kg 60.782 kg     Telemetry    N/A - Personally Reviewed  Physical Exam   GEN: No acute distress.   HEENT: Normocephalic, atraumatic, sclera non-icteric. Neck: No JVD or bruits. Cardiac: RRR no murmurs, rubs, or gallops.  Respiratory: Clear to auscultation bilaterally. Breathing is unlabored. GI: Soft, nontender, non-distended, BS +x 4. MS: no deformity. Extremities: No clubbing or cyanosis. No edema. Distal pedal pulses are 2+ and equal bilaterally. Neuro:  AAOx3. Follows commands. Psych:  Responds to questions appropriately with a normal affect.  Labs    High Sensitivity Troponin:  No results for input(s): "TROPONINIHS" in the last 720 hours.    Cardiac EnzymesNo results for input(s): "TROPONINI" in the last 168 hours. No results for input(s): "TROPIPOC" in the last 168 hours.   Chemistry Recent Labs  Lab 12/10/22 0324 12/11/22 0355 12/12/22 0418  NA 140 138 138  K 4.7 4.8 4.9  CL 110 109 106  CO2 20* 21* 21*  GLUCOSE 117* 121* 107*  BUN 52* 53* 55*  CREATININE 2.90* 3.04* 3.21*  CALCIUM 8.4* 8.3* 8.1*  PROT 5.7* 5.9* 6.1*  ALBUMIN 2.2* 2.2* 2.2*  AST 11* 12* 12*  ALT 7 7 5   ALKPHOS 63 67 75  BILITOT 0.8 0.6 0.6  GFRNONAA 18* 17* 16*  ANIONGAP 10 8 11      Hematology Recent Labs  Lab 12/10/22 0324 12/11/22 0355 12/12/22  0418  WBC 4.5 4.4 4.0  RBC 2.97* 2.93* 3.01*  HGB 8.5* 8.4* 8.6*  HCT 28.0* 28.7* 28.5*  MCV 94.3 98.0 94.7  MCH 28.6 28.7 28.6  MCHC 30.4 29.3* 30.2  RDW 17.0* 17.3* 17.1*  PLT 135* 147* 141*    BNPNo results for input(s): "BNP", "PROBNP" in the last 168 hours.   DDimer No results for input(s): "DDIMER" in the last 168 hours.   Radiology    ECHOCARDIOGRAM LIMITED  Result Date: 12/11/2022    ECHOCARDIOGRAM LIMITED REPORT   Patient Name:   Jaime Gomez Date of Exam: 12/11/2022 Medical Rec #:  [id number]    Height:       65.0 in Accession #:    [id number]   Weight:       139.8 lb Date of Birth:  August 18, 1965    BSA:          1.699 m Patient Age:    57 years     BP:           112/75 mmHg Patient Gender: F            HR:           80 bpm.  Exam Location:  Inpatient Procedure: Intracardiac Opacification Agent, Limited Echo and 2D Echo Indications:    Cardiomyopathy - Unspecified.  History:        Patient has prior history of Echocardiogram examinations, most                 recent 12/04/2022. Risk Factors:Hypertension and Diabetes.  Sonographer:    Lucy Antigua Referring Phys: [phone] Keygan Dumond N Keighley Deckman IMPRESSIONS  1. Left ventricular ejection fraction, by estimation, is 45 to 50%. The left ventricle has mildly decreased function. The left ventricle demonstrates global hypokinesis. The left ventricular internal cavity size was mildly dilated. Comparison(s): Changes from prior study are noted. LVEF has improved compared to prior study. FINDINGS  Left Ventricle: Left ventricular ejection fraction, by estimation, is 45 to 50%. The left ventricle has mildly decreased function. The left ventricle demonstrates global hypokinesis. The left ventricular internal cavity size was mildly dilated. LEFT VENTRICLE PLAX 2D LVIDd:         5.20 cm LVIDs:         3.80 cm LV PW:         0.80 cm LV IVS:        0.70 cm  LV Volumes (MOD) LV vol d, MOD A2C: 112.0 ml LV vol d, MOD A4C: 86.6 ml LV vol s, MOD A2C: 61.2 ml LV vol s, MOD A4C: 41.7 ml LV SV MOD A2C:     50.8 ml LV SV MOD A4C:     86.6 ml LV SV MOD BP:      47.3 ml Jodelle Red MD Electronically signed by Jodelle Red MD Signature Date/Time: 12/11/2022/7:55:19 PM    Final     Cardiac Studies   2d echo 12/04/22  1. Global hypokinesis with apical akinesis; overall moderate LV  dysfunction.   2. Left ventricular ejection fraction, by estimation, is 30 to 35%. The  left ventricle has moderately decreased function. The left ventricle  demonstrates regional wall motion abnormalities (see scoring  diagram/findings for description). The left  ventricular internal cavity size was mildly dilated. Left ventricular  diastolic parameters are consistent with Grade I diastolic dysfunction  (impaired  relaxation).   3. Right ventricular systolic function is normal. The right ventricular  size is normal.   4. The  mitral valve is normal in structure. Trivial mitral valve  regurgitation. No evidence of mitral stenosis.   5. The aortic valve is tricuspid. Aortic valve regurgitation is not  visualized. No aortic stenosis is present.   6. The inferior vena cava is normal in size with greater than 50%  respiratory variability, suggesting right atrial pressure of 3 mmHg.   Comparison(s): No prior Echocardiogram.   2D echo 12/11/22 1. Left ventricular ejection fraction, by estimation, is 45 to 50%. The  left ventricle has mildly decreased function. The left ventricle  demonstrates global hypokinesis. The left ventricular internal cavity size  was mildly dilated.   Comparison(s): Changes from prior study are noted. LVEF has improved  compared to prior study.    Patient Profile     57 y.o. female with hypertension, type 2 diabetes mellitus, CKD stage IV with bilateral hydronephrosis s/p left nephrostomy tube, chronic right lower extremity DVT (reportedly previously failed Eliquis) on Arixtra PTA, stage IV endometrial cancer on Afinitor/ Letrozole, chronic iron deficiency anemia requiring periodic transfusions of iron/PRBCs. She was admitted 11/28/22 for fever and weakness, found to be hypotensive and tachycardic. She was found to have sepsis due to UTI with pseudomonas bacteremia, AKI superimposed on CKD requiring nephrostomy exchange, hospital course complicated by encephalopathy (possibly due to cefepime toxicity), acute vs subacute CVA, and a/c anemia requiring transfusion. Neurology was consulted and felt etiology was likely cardioembolic vs secondary hypercoagulable state of malignancy. Cardiology consulted for LVEF 30-35%. IM, oncology, ID, neurology following. Per IM/oncology, plan is to transition to Lovenox-Coumadin.  Assessment & Plan    1. 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 Servando Salina. Addendum: f/u BP 115/74 -> we'll hold on the Bidil at DC and continue carvedilol 12.5mg  BID per d/w MD - no ACEI, ARB, ARNI, spiro given advanced renal disease - on 5/20, discussed monitoring for worsening HF symptoms or weight gain after DC   2. Bacteremia, sepsis due to UTI - per prior cardiology, neuro notes, low index of suspicion for endocarditis - abx per primary team   3. Acute vs subacute stroke - neurology had been involved, no large vessel occlusions, question cardioembolic versus related to hypercoagulability in malignancy - previously failed Eliquis as an outpatient, on Atrixa PTA - per IM's discussion with oncology, patient to be transitioned to Lovenox-Coumadin overlap - will defer to oncology to follow anticoagulation given this is not specifically for cardiac reasons   Remainder of medical issues per IM F/u scheduled 6/10 with cardiology APP, appt outlined on AVS   For questions or updates, please contact [facility] Please consult [email or web address] for contact info under Cardiology/STEMI.  Signed, Laurann Montana, PA-C 12/12/2022, 7:41 AM

## 2022-12-12 NOTE — Progress Notes (Addendum)
ANTICOAGULATION CONSULT NOTE - Follow Up Consult  Pharmacy Consult for Lovenox and warfarin Indication: hx DVT  Allergies  Allergen Reactions   Taxotere [Docetaxel] Swelling    Throat swelling    Patient Measurements: Height: 5\' 5"  (165.1 cm) Weight: 63.4 kg (139 lb 12.8 oz) IBW/kg (Calculated) : 57 Heparin Dosing Weight:   Vital Signs: Temp: 98.3 F (36.8 C) (05/21 0539) Temp Source: Oral (05/21 0539) BP: 98/57 (05/21 0539) Pulse Rate: 75 (05/21 0539)  Labs: Recent Labs    12/10/22 0324 12/11/22 0355 12/12/22 0418  HGB 8.5* 8.4* 8.6*  HCT 28.0* 28.7* 28.5*  PLT 135* 147* 141*  LABPROT 15.2 17.3* 20.1*  INR 1.2 1.4* 1.7*  CREATININE 2.90* 3.04* 3.21*     Estimated Creatinine Clearance: 17.2 mL/min (A) (by C-G formula based on SCr of 3.21 mg/dL (H)).   Medications:  - PTA Fondaparinux 7.5mg  SQ daily (contraindicated per drug labeling with CrCl chronically < 30 ml/min). Last dose per patient 5/7.   Assessment: Patient is a 57 y.o F with hx CKD, nephrostomy tube, anemia, endometrial cancer and RLE DVT on fondaparinux PTA (failed Eliquis) who presented to the ED on 5//24 with c/o fever and generalized weakness.  She underwent left sided PCN exchange on 5/8. She was started on heparin drip on 11/30/22 and transitioned to lovenox/warfarin on 12/08/22.   Using the warfarin dose initiation algorithm from [facility] (warfarindosing.org) with data for this patient produces an estimated warfarin maintenance dosage of 4.5 mg daily. This is only an estimation; close INR monitoring will be required during warfarin initiation and maintenance.  Today, 12/12/2022: - INR is subtherapeutic at 1.7 - Hgb= 8.6, Plt: 141 (low, but stable)  - no bleeding documented - CrCl <30 - drug-drug intxns: being on abx (meropenem) can make pt more sensitive to warfarin.  Also, Ciprofloxacin (possible at discharge): can enhance anticoagulant effect of Warfarin.  Goal of  Therapy:  INR 2-3 Monitor platelets by anticoagulation protocol: Yes   Plan:  - Warfarin 5mg  PO x1 dose.  - Continue Lovenox 1 mg/kg q24h until INR is therapeutic - Daily INR - Monitor for s/sx bleeding   Lashundra Shiveley W Lequita Meadowcroft 12/12/2022,9:31 AM

## 2022-12-12 NOTE — Progress Notes (Signed)
Mobility Specialist - Progress Note   12/12/22 1056  Mobility  Activity Ambulated with assistance in hallway  Level of Assistance Standby assist, set-up cues, supervision of patient - no hands on  Assistive Device Front wheel walker  Distance Ambulated (ft) 200 ft  Activity Response Tolerated well  Mobility Referral Yes  $Mobility charge 1 Mobility  Mobility Specialist Start Time (ACUTE ONLY) 1044  Mobility Specialist Stop Time (ACUTE ONLY) 1055  Mobility Specialist Time Calculation (min) (ACUTE ONLY) 11 min   Pt received in bed and agreeable to mobility. No complaints during session. Pt to EOB after session with all needs met.    [facility]

## 2022-12-12 NOTE — Discharge Summary (Addendum)
Physician Discharge Summary  Rosamond Deveaux VWU:[id number] DOB: Apr 04, 1966 DOA: 11/28/2022  PCP: Staci Acosta, FNP  Admit date: 11/28/2022 Discharge date: 12/12/2022  Admitted From: Home Disposition:  Home  Recommendations for Outpatient Follow-up:  Follow up with Dr. Myna Hidalgo this week, check an INR on 12/14/2022 send results with Dr. Myna Hidalgo Please obtain BMP/CBC in one week   Home Health:Yes Equipment/Devices:None  Discharge Condition:Stable CODE STATUS:Full Diet recommendation: Heart Healthy  Brief/Interim Summary: 57 y.o. female past medical history significant for endometrial cancer on oral chemotherapy, chronic kidney disease stage III, iron deficiency anemia, essential hypertension diabetes mellitus type 2 history of right lower extremity DVT while on Arixtra, will with a left-sided nephrostomy tube exchange on 11/29/2022 patient admitted for sepsis due to UTI IR consulted nephrostomy tube was placed.  She is status post 2 units of packed red blood cells Hospital course was complicated by acute kidney injury which is slowly improving and further complicated by multifocal CVA concern for embolic versus septic, neurology infectious disease were consulted.     Significant events: Status post percutaneous tube exchange on 11/29/2022. Port has been removed on 12/04/2022. Port-A-Cath removed on 12/04/2022. Transesophageal echo did not show vegetation 12/04/2022.  Discharge Diagnoses:  Principal Problem:   Sepsis secondary to UTI [facility]) Active Problems:   Endometrial cancer, FIGO stage IVB (HCC)   Leg DVT (deep venous thromboembolism), chronic, right (HCC)   Acute renal failure superimposed on stage 3a chronic kidney disease (HCC)   Essential (primary) hypertension   Type 2 diabetes mellitus without complications (HCC)   Abnormality of lung on CXR   Metabolic acidosis with normal anion gap and failure of bicarbonate regeneration   Major depressive disorder, single episode, moderate  (HCC)  Sepsis secondary to UTI/Pseudomonas bacteremia in the setting of porto-cath: Blood cultures were positive for Pseudomonas, on admission she was started on IV meropenem and she continued to have fevers. 2D echo showed no vegetation but it showed a low EF. ID was consulted and her Port-A-Cath was discontinued. Surveillance blood cultures repeated on 11/30/2022 remain negative to date. She defervesced remain afebrile. She worked with physical therapy recommended home health PT. ID recommended to continue oral antibiotic therapy with Cipro with an end date of 12/21/2022.  Takotsubo cardiomyopathy: Cardiology was consulted as an hide 2D echo was done that showed an EF of 30%. Cardiology recommended no ischemic workup. They were thinking more this was septic cardiomyopathy, she was started on BiDil and Coreg. Repeated 2D echo was done that showed an EF of 40%. She will go home only on Coreg her home dose.  Acute and subacute CVA/acute encephalopathy: MRI of the brain showed multiple infarcts. Neurology was consulted, who recommended an EEG that showed no seizure. She was previously on cefepime and cefepime was discontinued for meropenem and her encephalopathy resolved.  Acute metabolic encephalopathy: Likely due to cefepime toxicity. When cefepime was discontinued her mentation returned to baseline.  Acute kidney injury superimposed on chronic kidney disease stage IIIa: With a baseline creatinine of 2.6-3.0, on admission 4.9. She was treated conservatively and her creatinine returned to baseline. Renal ultrasound showed chronic renal disease.  Hypokalemia/hypomagnesemia: They were repleted her potassium was kept greater than 4 magnesium greater than 2.  Right chronic lower extremity DVT: Patient previously failed Eliquis and outpatient was on Arixtra upon admission due to her renal dysfunction Arixtra was discontinued. It was discussed with oncology and they agreed she will go home  on Lovenox and Coumadin for goal INR 2.0-3.0. Will follow-up  with oncology this week. Home health will check INR on 12/14/2022.  Depression: Continue sertraline.  Abnormal x-ray of the lung: Currently asymptomatic.  Anemia of chronic disease: She responded really well to 1 unit of packed red blood cells, her hemoglobin remained stable after there is no signs of bleeding.  Diabetes mellitus type 2 with peripheral neuropathy: She was continued on her long-acting insulin her 7030 was discontinued and she will use 2 units of insulin with meals along with her long-acting insulin at home.  Essential hypertension: Due to borderline blood pressure as her ejection fraction was improving both Coreg and BiDil were held she will follow-up with cardiology in 2 weeks to reassess her blood pressure. Titrate antihypertensive medications as tolerated.  Endometrial cancer stage IV: Follow-up with oncology as an outpatient.    Discharge Instructions  Discharge Instructions     Diet - low sodium heart healthy   Complete by: As directed    Diet - low sodium heart healthy   Complete by: As directed    Increase activity slowly   Complete by: As directed    Increase activity slowly   Complete by: As directed    No wound care   Complete by: As directed    No wound care   Complete by: As directed       Allergies as of 12/12/2022       Reactions   Taxotere [docetaxel] Swelling   Throat swelling        Medication List     STOP taking these medications    folic acid 1 MG tablet Commonly known as: FOLVITE   fondaparinux 7.5 MG/0.6ML Soln injection Commonly known as: Arixtra   nitrofurantoin (macrocrystal-monohydrate) 100 MG capsule Commonly known as: MACROBID   NovoLOG Mix 70/30 FlexPen (70-30) 100 UNIT/ML FlexPen Generic drug: insulin aspart protamine - aspart       TAKE these medications    acetaminophen 500 MG tablet Commonly known as: TYLENOL Take 500 mg by mouth  every 6 (six) hours as needed for mild pain.   carvedilol 12.5 MG tablet Commonly known as: Coreg Take 1 tablet (12.5 mg total) by mouth 2 (two) times daily with a meal.   ciprofloxacin 500 MG tablet Commonly known as: Cipro Take 1.5 tablets (750 mg total) by mouth daily with breakfast for 5 days.   enoxaparin 60 MG/0.6ML injection Commonly known as: LOVENOX Inject 0.6 mLs (60 mg total) into the skin daily for 2 days.   everolimus 10 MG tablet Commonly known as: AFINITOR Take 10 mg by mouth daily.   gabapentin 300 MG capsule Commonly known as: NEURONTIN Take 1 capsule (300 mg total) by mouth 2 (two) times daily. What changed:  medication strength how much to take   insulin aspart 100 UNIT/ML FlexPen Commonly known as: NOVOLOG Inject 2 Units into the skin 3 (three) times daily with meals.   insulin detemir 100 UNIT/ML injection Commonly known as: LEVEMIR Inject 0.2 mLs (20 Units total) into the skin 2 (two) times daily.   mirabegron ER 25 MG Tb24 tablet Commonly known as: Myrbetriq Take 1 tablet (25 mg total) by mouth daily for 360 doses.   oxybutynin 10 MG 24 hr tablet Commonly known as: DITROPAN-XL Take 10 mg by mouth daily.   oxycodone 5 MG capsule Commonly known as: OXY-IR Take 1 capsule (5 mg total) by mouth every 4 (four) hours as needed for pain.   polyethylene glycol 17 g packet Commonly known as: MIRALAX / GLYCOLAX Take  17 g by mouth daily as needed for mild constipation.   prochlorperazine 10 MG tablet Commonly known as: COMPAZINE Take 10 mg by mouth every 6 (six) hours as needed for nausea or vomiting.   warfarin 5 MG tablet Commonly known as: COUMADIN Take 1 tablet (5 mg total) by mouth daily.        Follow-up Information     Corrin Parker, PA-C Follow up.   Specialty: Cardiology Why: Hospital follow-up with Cardiology scheduled for 01/01/2023 at 10:05am. Please arrive 15 minutes early for check-in. If this date/time does not work for  you, please call our office to reschedule. Contact information: [address] [place] [address] [phone]         [facility] Follow up.   Why: Home Health Physical Therapy Home Health Occupational Therapy Contact information: [address] [place] [address] [phone]                Allergies  Allergen Reactions   Taxotere [Docetaxel] Swelling    Throat swelling    Consultations: Cardiology Infectious disease Oncology   Procedures/Studies: ECHOCARDIOGRAM LIMITED  Result Date: 12/11/2022    ECHOCARDIOGRAM LIMITED REPORT   Patient Name:   MASIAH PURGASON Date of Exam: 12/11/2022 Medical Rec #:  [id number]    Height:       65.0 in Accession #:    [id number]   Weight:       139.8 lb Date of Birth:  12-07-1965    BSA:          1.699 m Patient Age:    56 years     BP:           112/75 mmHg Patient Gender: F            HR:           80 bpm. Exam Location:  Inpatient Procedure: Intracardiac Opacification Agent, Limited Echo and 2D Echo Indications:    Cardiomyopathy - Unspecified.  History:        Patient has prior history of Echocardiogram examinations, most                 recent 12/04/2022. Risk Factors:Hypertension and Diabetes.  Sonographer:    Lucy Antigua Referring Phys: [id number] DAYNA N DUNN IMPRESSIONS  1. Left ventricular ejection fraction, by estimation, is 45 to 50%. The left ventricle has mildly decreased function. The left ventricle demonstrates global hypokinesis. The left ventricular internal cavity size was mildly dilated. Comparison(s): Changes from prior study are noted. LVEF has improved compared to prior study. FINDINGS  Left Ventricle: Left ventricular ejection fraction, by estimation, is 45 to 50%. The left ventricle has mildly decreased function. The left ventricle demonstrates global hypokinesis. The left ventricular internal cavity size was mildly dilated. LEFT VENTRICLE PLAX 2D LVIDd:         5.20 cm LVIDs:          3.80 cm LV PW:         0.80 cm LV IVS:        0.70 cm  LV Volumes (MOD) LV vol d, MOD A2C: 112.0 ml LV vol d, MOD A4C: 86.6 ml LV vol s, MOD A2C: 61.2 ml LV vol s, MOD A4C: 41.7 ml LV SV MOD A2C:     50.8 ml LV SV MOD A4C:     86.6 ml LV SV MOD BP:      47.3 ml Jodelle Red MD  Electronically signed by Jodelle Red MD Signature Date/Time: 12/11/2022/7:55:19 PM    Final    VAS US CAROTID  Result Date: 12/08/2022 Carotid Arterial Duplex Study Patient Name:  AMALIA WEBB  Date of Exam:   12/04/2022 Medical Rec #: [id number]     Accession #:    [id number] Date of Birth: Mar 23, 1966     Patient Gender: F Patient Age:   28 years Exam Location[facility]  [facility] Procedure:      VAS US CAROTID Referring Phys: Stephania Fragmin --------------------------------------------------------------------------------  Indications:       CVA. Risk Factors:      Hypertension, Diabetes, CKD, no history of smoking. Limitations        Today's exam was limited due to patient unable to follow                    commands / position properly. Comparison Study:  No previous exams Performing Technologist: Jody Hill RVT, RDMS  Examination Guidelines: A complete evaluation includes B-mode imaging, spectral Doppler, color Doppler, and power Doppler as needed of all accessible portions of each vessel. Bilateral testing is considered an integral part of a complete examination. Limited examinations for reoccurring indications may be performed as noted.  Right Carotid Findings: +----------+--------+--------+--------+------------------+------------------+           PSV cm/sEDV cm/sStenosisPlaque DescriptionComments           +----------+--------+--------+--------+------------------+------------------+ CCA Prox  46      11                                intimal thickening +----------+--------+--------+--------+------------------+------------------+ CCA Distal51      16                                                    +----------+--------+--------+--------+------------------+------------------+ ICA Prox  50      19                                                   +----------+--------+--------+--------+------------------+------------------+ ICA Distal55      14                                                   +----------+--------+--------+--------+------------------+------------------+ ECA       55      6                                                    +----------+--------+--------+--------+------------------+------------------+ +----------+--------+-------+----------------+-------------------+           PSV cm/sEDV cmsDescribe        Arm Pressure (mmHG) +----------+--------+-------+----------------+-------------------+ [id number]             Multiphasic, WNL                    +----------+--------+-------+----------------+-------------------+ +---------+--------+--+--------+-+--------------+ VertebralPSV cm/s24EDV cm/s3High resistant +---------+--------+--+--------+-+--------------+  Left Carotid Findings: +----------+--------+--------+--------+------------------+------------------+           PSV cm/sEDV cm/sStenosisPlaque DescriptionComments           +----------+--------+--------+--------+------------------+------------------+ CCA Prox  56      8                                                    +----------+--------+--------+--------+------------------+------------------+ CCA Distal56      8                                 intimal thickening +----------+--------+--------+--------+------------------+------------------+ ICA Prox  54      16                                                   +----------+--------+--------+--------+------------------+------------------+ ICA Distal64      17                                                   +----------+--------+--------+--------+------------------+------------------+ ECA       60      6                                                     +----------+--------+--------+--------+------------------+------------------+ +----------+--------+--------+----------------+-------------------+           PSV cm/sEDV cm/sDescribe        Arm Pressure (mmHG) +----------+--------+--------+----------------+-------------------+ [id number]              Multiphasic, WNL                    +----------+--------+--------+----------------+-------------------+ +---------+--------+--+--------+-+---------+ VertebralPSV cm/s62EDV cm/s7Antegrade +---------+--------+--+--------+-+---------+   Summary: Right Carotid: The extracranial vessels were near-normal with only minimal wall                thickening or plaque. Left Carotid: The extracranial vessels were near-normal with only minimal wall               thickening or plaque. Vertebrals:  Left vertebral artery demonstrates antegrade flow. Right vertebral              artery demonstrates high resistant flow. Subclavians: Normal flow hemodynamics were seen in bilateral subclavian              arteries. *See table(s) above for measurements and observations.  Electronically signed by Delia Heady MD on 12/08/2022 at 2:28:18 PM.    Final    IR REMOVAL TUN ACCESS W/ PORT W/O FL MOD SED  Result Date: 12/04/2022 CLINICAL DATA:  Sepsis.  Pseudomonas bacteremia. EXAM: REMOVAL OF IMPLANTED TUNNELED PORT-A-CATH MEDICATIONS: 10 mL lidocaine 1%. ANESTHESIA/SEDATION: Local anesthetic was administered. FLUOROSCOPY TIME:  None PROCEDURE: Informed written consent was obtained from the patient and/or patient's representative after a discussion of the risk, benefits and alternatives to the procedure. The patient was positioned supine on the fluoroscopy table and the  RIGHT chest Port-A-Cath site was prepped with chlorhexidine. A sterile gown and gloves were worn during the procedure. Local anesthesia was provided with 1% lidocaine with epinephrine. A timeout was performed prior to the  initiation of the procedure. An incision was made overlying the Port-A-Cath with a #15 scalpel. Utilizing sharp and blunt dissection, the Port-A-Cath was removed completely. The pocked was irrigated with sterile saline. Wound closure was performed with interrupted subcutaneous 2-0 Vicryl sutures then Dermabond was applied at the skin. Dressings were applied. The patient tolerated the procedure well without immediate post procedural complication. FINDINGS: Removal of implant Port-A-Cath without immediate post procedural complication. IMPRESSION: Successful removal of an implanted RIGHT chest Port-A-Cath. Roanna Banning, MD [facility] Electronically Signed   By: Roanna Banning M.D.   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 EEG to evaluate for seizure Level of alertness:  lethargic AEDs during EEG study: GBP Technical aspects: This EEG study was done with scalp electrodes positioned according to the 10-20 International system of electrode placement. Electrical activity was reviewed with band pass filter of 1-70Hz , sensitivity of 7 uV/mm, display speed of 74mm/sec with a 60Hz  notched filter applied as appropriate. EEG data were recorded continuously and digitally stored.  Video monitoring was available and reviewed as appropriate. Description: EEG showed continuous generalized 3 to 5  Hz theta-delta slowing. Generalized periodic discharges with triphasic morphology at 1-1.5 Hz were also noted. Hyperventilation and photic stimulation were not performed.   ABNORMALITY - Periodic discharges with triphasic morphology, generalized ( GPDs) - Continuous slow, generalized IMPRESSION: This study showed generalized periodic discharges with triphasic morphology which can be on the ictal-interictal continuum. However, the morphology, frequency is more likely suggestive of toxic-metabolic causes. Additionally there is moderate to severe diffuse encephalopathy. No seizures were seen throughout the recording. Charlsie Quest   ECHOCARDIOGRAM COMPLETE  Result Date: 12/04/2022    ECHOCARDIOGRAM REPORT   Patient Name:   ANETTE DIKE Date of Exam: 12/04/2022 Medical Rec #:  [id number]    Height:       65.0 in Accession #:    [id number]   Weight:       134.0 lb Date of Birth:  04/13/1966    BSA:          1.669 m Patient Age:    56 years     BP:           145/78 mmHg Patient Gender: F            HR:           86 bpm. Exam Location:  Inpatient Procedure: 2D Echo, Color Doppler and Cardiac Doppler Indications:    Bacteremia  History:        Patient has no prior history of Echocardiogram examinations.                 Risk Factors:Hypertension and Diabetes. Endometrial Cancer, DVT,  CKD, Sepsis.  Sonographer:    Milbert Coulter Referring Phys: [id number] [facility] IMPRESSIONS  1. Global hypokinesis with apical akinesis; overall moderate LV dysfunction.  2. Left ventricular ejection fraction, by estimation, is 30 to 35%. The left ventricle has moderately decreased function. The left ventricle demonstrates regional wall motion abnormalities (see scoring diagram/findings for description). The left ventricular internal cavity size was mildly dilated. Left ventricular diastolic parameters are consistent with Grade I diastolic dysfunction (impaired relaxation).  3. Right ventricular systolic function is  normal. The right ventricular size is normal.  4. The mitral valve is normal in structure. Trivial mitral valve regurgitation. No evidence of mitral stenosis.  5. The aortic valve is tricuspid. Aortic valve regurgitation is not visualized. No aortic stenosis is present.  6. The inferior vena cava is normal in size with greater than 50% respiratory variability, suggesting right atrial pressure of 3 mmHg. Comparison(s): No prior Echocardiogram. FINDINGS  Left Ventricle: Left ventricular ejection fraction, by estimation, is 30 to 35%. The left ventricle has moderately decreased function. The left ventricle demonstrates regional wall motion abnormalities. The left ventricular internal cavity size was mildly dilated. There is no left ventricular hypertrophy. Left ventricular diastolic parameters are consistent with Grade I diastolic dysfunction (impaired relaxation). Right Ventricle: The right ventricular size is normal. Right ventricular systolic function is normal. Left Atrium: Left atrial size was normal in size. Right Atrium: Right atrial size was normal in size. Pericardium: Trivial pericardial effusion is present. Mitral Valve: The mitral valve is normal in structure. Trivial mitral valve regurgitation. No evidence of mitral valve stenosis. Tricuspid Valve: The tricuspid valve is normal in structure. Tricuspid valve regurgitation is not demonstrated. No evidence of tricuspid stenosis. Aortic Valve: The aortic valve is tricuspid. Aortic valve regurgitation is not visualized. No aortic stenosis is present. Aortic valve mean gradient measures 3.0 mmHg. Aortic valve peak gradient measures 6.0 mmHg. Aortic valve area, by VTI measures 2.74 cm. Pulmonic Valve: The pulmonic valve was normal in structure. Pulmonic valve regurgitation is not visualized. No evidence of pulmonic stenosis. Aorta: The aortic root is normal in size and structure. Venous: The inferior vena cava is normal in size with greater than 50% respiratory  variability, suggesting right atrial pressure of 3 mmHg. IAS/Shunts: No atrial level shunt detected by color flow Doppler. Additional Comments: Global hypokinesis with apical akinesis; overall moderate LV dysfunction.  LEFT VENTRICLE PLAX 2D LVIDd:         5.40 cm     Diastology LVIDs:         3.80 cm     LV e' medial:    4.79 cm/s LV PW:         0.90 cm     LV E/e' medial:  13.2 LV IVS:        0.90 cm     LV e' lateral:   3.59 cm/s LVOT diam:     2.10 cm     LV E/e' lateral: 17.7 LV SV:         59 LV SV Index:   35 LVOT Area:     3.46 cm  LV Volumes (MOD) LV vol d, MOD A2C: 80.0 ml LV vol d, MOD A4C: 78.0 ml LV vol s, MOD A2C: 64.6 ml LV vol s, MOD A4C: 44.5 ml LV SV MOD A2C:     15.4 ml LV SV MOD A4C:     78.0 ml LV SV MOD BP:      27.8 ml RIGHT VENTRICLE RV  Basal diam:  2.60 cm RV Mid diam:    2.50 cm RV S prime:     10.60 cm/s TAPSE (M-mode): 1.9 cm LEFT ATRIUM             Index        RIGHT ATRIUM          Index LA diam:        3.20 cm 1.92 cm/m   RA Area:     5.91 cm LA Vol (A2C):   35.7 ml 21.40 ml/m  RA Volume:   7.95 ml  4.76 ml/m LA Vol (A4C):   40.6 ml 24.33 ml/m LA Biplane Vol: 38.7 ml 23.19 ml/m  AORTIC VALVE AV Area (Vmax):    2.90 cm AV Area (Vmean):   2.65 cm AV Area (VTI):     2.74 cm AV Vmax:           122.00 cm/s AV Vmean:          80.100 cm/s AV VTI:            0.215 m AV Peak Grad:      6.0 mmHg AV Mean Grad:      3.0 mmHg LVOT Vmax:         102.00 cm/s LVOT Vmean:        61.300 cm/s LVOT VTI:          0.170 m LVOT/AV VTI ratio: 0.79  AORTA Ao Root diam: 3.20 cm Ao Asc diam:  3.40 cm MITRAL VALVE MV Area (PHT): 3.77 cm    SHUNTS MV Decel Time: 201 msec    Systemic VTI:  0.17 m MV E velocity: 63.40 cm/s  Systemic Diam: 2.10 cm MV A velocity: 85.30 cm/s MV E/A ratio:  0.74 Olga Millers MD Electronically signed by Olga Millers MD Signature Date/Time: 12/04/2022/4:08:12 PM    Final    MR ANGIO HEAD WO CONTRAST  Result Date: 12/04/2022 CLINICAL DATA:  Stroke/TIA, determine embolic  source. EXAM: MRA HEAD WITHOUT CONTRAST TECHNIQUE: Angiographic images of the Circle of Willis were acquired using MRA technique without intravenous contrast. COMPARISON:  Head MRI 12/04/2022 FINDINGS: The study is motion degraded, particularly at the level of the circle of Willis and more superiorly. Anterior circulation: The internal carotid arteries are patent from skull base to carotid termini with suboptimal assessment of the supraclinoid segments but without evidence of a significant stenosis more proximally. ACAs and MCAs are grossly patent, however motion artifact precludes detailed assessment of the A1, M1, M2, and proximal A2 segments. No large aneurysm is identified. Posterior circulation: The included intracranial portion of the left vertebral artery is widely patent to the basilar and strongly dominant. The distal right vertebral artery is small and not well evaluated, likely congenitally hypoplastic. Patent bilateral PICA, left AICA, and bilateral SCA origins are visualized. The basilar artery is widely patent. The right PCA is patent without evidence a flow limiting proximal stenosis. The left PCA is poorly visualized. Comparing with today's earlier head MRI, there is likely a fetal origin of the left PCA. Faint signal is present in the left posterior communicating artery and proximal left P2 segment with assessment limited by artifact. No aneurysm is identified. Anatomic variants: Suspected fetal left PCA. IMPRESSION: Motion degraded examination, particularly severe at the level of the circle of Willis. No gross large vessel occlusion in the anterior circulation. Poor evaluation of the left PCA. Electronically Signed   By: Sebastian Ache M.D.   On: 12/04/2022 16:00  MR BRAIN WO CONTRAST  Result Date: 12/04/2022 CLINICAL DATA:  Mental status change, unknown cause EXAM: MRI HEAD WITHOUT CONTRAST TECHNIQUE: Multiplanar, multiecho pulse sequences of the brain and surrounding structures were obtained  without intravenous contrast. COMPARISON:  CT head Nov 30, 2022. FINDINGS: Brain: Multiple areas of restricted diffusion and T2/FLAIR hyperintensity in the left frontoparietal white matter. Additional punctate acute infarcts in the left cerebellum and left occipital lobe. Punctate acute infarct in the right occipital lobe. No evidence of acute hemorrhage, mass effect or midline shift. Trace amount of prior hemorrhage associated with the left frontal parietal infarcts. No hydrocephalus. Vascular: Major arterial flow voids are maintained at the skull base. Skull and upper cervical spine: Normal marrow signal. Sinuses/Orbits: Left maxillary sinus mucosal thickening. No acute orbital findings. IMPRESSION: 1. 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 Given involvement of multiple vascular territories, consider an embolic etiology. 2. No significant mass effect. Electronically Signed   By: Feliberto Harts M.D.   On: 12/04/2022 10:29   DG Chest Port 1 View  Result Date: 12/03/2022 CLINICAL DATA:  Dyspnea.  Fever. EXAM: PORTABLE CHEST 1 VIEW COMPARISON:  Nov 28, 2022 FINDINGS: Stable right Port-A-Cath in good position. No pneumothorax. The right lung is clear. The cardiomediastinal silhouette is stable. New hazy opacity in the left perihilar region. IMPRESSION: New hazy opacity in the left perihilar region could represent developing pneumonia. Recommend short-term follow-up imaging to ensure resolution. Electronically Signed   By: Gerome Sam III M.D.   On: 12/03/2022 10:26   CT HEAD WO CONTRAST ( )  Result Date: 11/30/2022 CLINICAL DATA:  Transient ischemic attack EXAM: CT HEAD WITHOUT CONTRAST TECHNIQUE: Contiguous axial images were obtained from the base of the skull through the vertex without intravenous contrast. RADIATION DOSE REDUCTION: This exam was performed according to the departmental  dose-optimization program which includes automated exposure control, adjustment of the mA and/or kV according to patient size and/or use of iterative reconstruction technique. COMPARISON:  09/01/2021 FINDINGS: Brain: There is no mass, hemorrhage or extra-axial collection. The size and configuration of the ventricles and extra-axial CSF spaces are normal. 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 Vascular: Atherosclerotic calcification of the vertebral and internal carotid arteries at the skull base. No abnormal hyperdensity of the major intracranial arteries or dural venous sinuses. Skull: The visualized skull base, calvarium and extracranial soft tissues are normal. Sinuses/Orbits: No fluid levels or advanced mucosal thickening of the visualized paranasal sinuses. No mastoid or middle ear effusion. The orbits are normal. IMPRESSION: 1. No acute intracranial abnormality. 2. 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 Electronically Signed   By: Deatra Robinson M.D.   On: 11/30/2022 23:01   US RENAL  Result Date: 11/30/2022 CLINICAL DATA:  [id number] AKI (acute kidney injury) (HCC) [id number] EXAM: RENAL / URINARY TRACT ULTRASOUND COMPLETE COMPARISON:  CT 09/01/2021 FINDINGS: Right Kidney: Renal measurements: 9.9 x 5.8 x 3.8 cm = volume: 114.5 mL. No hydronephrosis. Increased renal cortical echogenicity. Stent is likely visualized. Left Kidney: Renal measurements: 8.1 x 4.6 x 4.6 cm = volume: 89.6 mL. No hydronephrosis. Increased renal cortical echogenicity. Bladder: Minimal bladder distension with circumferential bladder wall thickening. Other: Limited exam due to bowel gas and body habitus. IMPRESSION: No hydronephrosis. Increased renal cortical echogenicity bilaterally, as can be seen in medical renal disease. Minimal bladder distension with circumferential bladder wall thickening, similar to prior  CT, can be seen in cystitis. Correlate with  urinalysis. Electronically Signed   By: Caprice Renshaw M.D.   On: 11/30/2022 11:56   IR NEPHROSTOMY EXCHANGE LEFT  Result Date: 11/29/2022 INDICATION: History cervical malignancy with bilateral obstructive uropathy, post right-sided ureteral stent placement with failed attempt at left-sided ureteral stent placement, initially post left-sided percutaneous nephrostomy catheter placement on 09/03/2021. 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 Patient now admitted with urinary tract infection and request made for left-sided percutaneous nephrostomy catheter exchange. EXAM: FLUOROSCOPIC GUIDED LEFT SIDED NEPHROSTOMY CATHETER EXCHANGE COMPARISON:  Attempted left-sided ureteral stent placement-01/20/2022; 11/02/2021 Image guided left-sided nephrostomy catheter placement-09/03/2021 PET-CT-08/21/2022 CONTRAST:  5 cc Omnipaque 300, administered into the left renal collecting system. FLUOROSCOPY TIME:  49 seconds (5 mGy) COMPLICATIONS: None immediate. TECHNIQUE: Informed written consent was obtained from the patient after a discussion of the risks, benefits and alternatives to treatment. Questions regarding the procedure were encouraged and answered. A timeout was performed prior to the initiation of the procedure. The left flank and external portion of existing nephrostomy catheter were prepped and draped in the usual sterile fashion. A sterile drape was applied covering the operative field. Maximum barrier sterile technique with sterile gowns and gloves were used for the procedure. A timeout was performed prior to the initiation of the procedure. A pre procedural spot fluoroscopic image was obtained after contrast was injected via the existing nephrostomy catheter demonstrating appropriate positioning within the renal  pelvis. The existing nephrostomy catheter was cut and cannulated with a Benson wire which was coiled within the renal pelvis. Under intermittent fluoroscopic guidance, the existing nephrostomy catheter was exchanged for a new 10.2 [place] all-purpose drainage catheter. Contrast injection confirmed appropriate positioning within the renal pelvis and a post exchange fluoroscopic image was obtained. The catheter was locked and secured to the skin with a suture. A dressing was applied. The patient tolerated the procedure well without immediate postprocedural complication. FINDINGS: The existing nephrostomy catheter is appropriately positioned and functioning. Antegrade nephrostogram demonstrate patency of the left ureter to the level of the urinary bladder. 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 After successful fluoroscopic guided exchange, the new nephrostomy catheter is coiled and locked within the left renal pelvis. IMPRESSION: Successful fluoroscopic guided exchange of left sided 10.2 French percutaneous nephrostomy catheter. PLAN: Per patient request, consideration for a final attempt at left-sided ureteral stent placement may be performed at the time of the patient's next fluoroscopic guided exchange though if this attempt is again unsuccessful, the patient will require a chronic left-sided nephrostomy catheter. Electronically Signed   By: Simonne Come M.D.   On: 11/29/2022 10:21   DG Chest Port 1 View  Result Date: 11/28/2022 CLINICAL DATA:  Questionable sepsis evaluate for abnormality. EXAM: PORTABLE CHEST 1 VIEW COMPARISON:  Chest radiograph 09/01/2021.  PET-CT 08/21/2022. FINDINGS: Right chest Port-A-Cath tip projects over the superior cavoatrial junction. Low lung volumes accentuate the pulmonary vasculature and cardiomediastinal silhouette. Unchanged patchy perihilar  opacities, accounting for differences in modality. No new airspace disease. No pleural effusion or pneumothorax. Visualized bones and upper abdomen are unremarkable. IMPRESSION: Unchanged patchy perihilar opacities, accounting for differences in modality. No new airspace disease. Electronically Signed   By: Zollie Beckers  Wiggins M.D.   On: 11/28/2022 09:01   (Echo, Carotid, EGD, Colonoscopy, ERCP)    Subjective: No complaints  Discharge Exam: Vitals:   12/12/22 0539 12/12/22 1016  BP: (!) 98/57 115/64  Pulse: 75 74  Resp: 18   Temp: 98.3 F (36.8 C)   SpO2: 96%    Vitals:   12/11/22 1627 12/11/22 2025 12/12/22 0539 12/12/22 1016  BP: 124/73 (!) 102/57 (!) 98/57 115/64  Pulse: 79 82 75 74  Resp:  19 18   Temp:  98.3 F (36.8 C) 98.3 F (36.8 C)   TempSrc:  Oral Oral   SpO2:  95% 96%   Weight:      Height:        General: Pt is alert, awake, not in acute distress Cardiovascular: RRR, S1/S2 +, no rubs, no gallops Respiratory: CTA bilaterally, no wheezing, no rhonchi Abdominal: Soft, NT, ND, bowel sounds + Extremities: no edema, no cyanosis    The results of significant diagnostics from this hospitalization (including imaging, microbiology, ancillary and laboratory) are listed below for reference.     Microbiology: No results found for this or any previous visit (from the past 240 hour(s)).   Labs: BNP (last 3 results) Recent Labs    12/03/22 0255  BNP 2,665.1*   Basic Metabolic Panel: Recent Labs  Lab 12/06/22 0359 12/07/22 1652 12/08/22 0823 12/09/22 0359 12/10/22 0324 12/11/22 0355 12/12/22 0418  NA 146*   < > 140 141 140 138 138  K 3.5   < > 3.7 4.5 4.7 4.8 4.9  CL 113*   < > 110 112* 110 109 106  CO2 20*   < > 21* 21* 20* 21* 21*  GLUCOSE 113*   < > 166* 123* 117* 121* 107*  BUN 63*   < > 48* 52* 52* 53* 55*  CREATININE 3.19*   < > 2.67* 2.89* 2.90* 3.04* 3.21*  CALCIUM 9.0   < > 8.4* 8.4* 8.4* 8.3* 8.1*  MG 1.8  --   --   --   --   --   --    < > =  values in this interval not displayed.   Liver Function Tests: Recent Labs  Lab 12/08/22 0823 12/09/22 0359 12/10/22 0324 12/11/22 0355 12/12/22 0418  AST 14* 12* 11* 12* 12*  ALT 7 7 7 7 5   ALKPHOS 78 68 63 67 75  BILITOT 0.5 0.7 0.8 0.6 0.6  PROT 6.3* 5.8* 5.7* 5.9* 6.1*  ALBUMIN 2.3* 2.2* 2.2* 2.2* 2.2*   No results for input(s): "LIPASE", "AMYLASE" in the last 168 hours. No results for input(s): "AMMONIA" in the last 168 hours. CBC: Recent Labs  Lab 12/06/22 0359 12/08/22 0823 12/10/22 0324 12/11/22 0355 12/12/22 0418  WBC 4.7 5.5 4.5 4.4 4.0  NEUTROABS  --   --  3.1 2.9 2.5  HGB 10.8* 10.4* 8.5* 8.4* 8.6*  HCT 34.3* 32.9* 28.0* 28.7* 28.5*  MCV 91.0 91.1 94.3 98.0 94.7  PLT 186 148* 135* 147* 141*   Cardiac Enzymes: No results for input(s): "CKTOTAL", "CKMB", "CKMBINDEX", "TROPONINI" in the last 168 hours. BNP: Invalid input(s): "POCBNP" CBG: Recent Labs  Lab 12/11/22 0804 12/11/22 1155 12/11/22 1716 12/11/22 2027 12/12/22 0804  GLUCAP 146* 117* 108* 223* 157*   D-Dimer No results for input(s): "DDIMER" in the last 72 hours. Hgb A1c No results for input(s): "HGBA1C" in the last 72 hours. Lipid Profile No results for input(s): "CHOL", "HDL", "LDLCALC", "TRIG", "CHOLHDL", "LDLDIRECT" in the last  72 hours. Thyroid function studies No results for input(s): "TSH", "T4TOTAL", "T3FREE", "THYROIDAB" in the last 72 hours.  Invalid input(s): "FREET3" Anemia work up No results for input(s): "VITAMINB12", "FOLATE", "FERRITIN", "TIBC", "IRON", "RETICCTPCT" in the last 72 hours. Urinalysis    Component Value Date/Time   COLORURINE YELLOW 11/28/2022 0951   APPEARANCEUR TURBID (A) 11/28/2022 0951   LABSPEC >=1.030 11/28/2022 0951   PHURINE 5.0 11/28/2022 0951   GLUCOSEU NEGATIVE 11/28/2022 0951   HGBUR LARGE (A) 11/28/2022 0951   BILIRUBINUR NEGATIVE 11/28/2022 0951   KETONESUR NEGATIVE 11/28/2022 0951   PROTEINUR >=300 (A) 11/28/2022 0951   NITRITE  NEGATIVE 11/28/2022 0951   LEUKOCYTESUR LARGE (A) 11/28/2022 0951   Sepsis Labs Recent Labs  Lab 12/08/22 0823 12/10/22 0324 12/11/22 0355 12/12/22 0418  WBC 5.5 4.5 4.4 4.0   Microbiology No results found for this or any previous visit (from the past 240 hour(s)).  SIGNED:   Marinda Elk, MD  [facility] 12/12/2022, 10:52 AM Pager   If 7PM-7AM, please contact night-coverage [email or web address] Password [email or web address]

## 2022-12-12 NOTE — Plan of Care (Signed)
  Problem: Education: Goal: Knowledge of General Education information will improve Description: Including pain rating scale, medication(s)/side effects and non-pharmacologic comfort measures Outcome: Adequate for Discharge   Problem: Health Behavior/Discharge Planning: Goal: Ability to manage health-related needs will improve Outcome: Adequate for Discharge   Problem: Clinical Measurements: Goal: Ability to maintain clinical measurements within normal limits will improve Outcome: Adequate for Discharge Goal: Will remain free from infection Outcome: Adequate for Discharge Goal: Respiratory complications will improve Outcome: Adequate for Discharge Goal: Cardiovascular complication will be avoided Outcome: Adequate for Discharge   Problem: Activity: Goal: Risk for activity intolerance will decrease Outcome: Adequate for Discharge   Problem: Nutrition: Goal: Adequate nutrition will be maintained Outcome: Adequate for Discharge   Problem: Coping: Goal: Level of anxiety will decrease Outcome: Adequate for Discharge   Problem: Elimination: Goal: Will not experience complications related to bowel motility Outcome: Adequate for Discharge   Problem: Pain Managment: Goal: General experience of comfort will improve Outcome: Adequate for Discharge   Problem: Safety: Goal: Ability to remain free from injury will improve Outcome: Adequate for Discharge   Problem: Skin Integrity: Goal: Risk for impaired skin integrity will decrease Outcome: Adequate for Discharge   Problem: Education: Goal: Ability to describe self-care measures that may prevent or decrease complications (Diabetes Survival Skills Education) will improve Outcome: Adequate for Discharge

## 2022-12-13 ENCOUNTER — Other Ambulatory Visit: Payer: Self-pay | Admitting: *Deleted

## 2022-12-13 DIAGNOSIS — T83512D Infection and inflammatory reaction due to nephrostomy catheter, subsequent encounter: Secondary | ICD-10-CM | POA: Diagnosis not present

## 2022-12-13 DIAGNOSIS — N390 Urinary tract infection, site not specified: Secondary | ICD-10-CM | POA: Diagnosis not present

## 2022-12-13 DIAGNOSIS — A4150 Gram-negative sepsis, unspecified: Secondary | ICD-10-CM | POA: Diagnosis not present

## 2022-12-13 DIAGNOSIS — I82501 Chronic embolism and thrombosis of unspecified deep veins of right lower extremity: Secondary | ICD-10-CM | POA: Diagnosis not present

## 2022-12-13 DIAGNOSIS — D638 Anemia in other chronic diseases classified elsewhere: Secondary | ICD-10-CM | POA: Diagnosis not present

## 2022-12-13 DIAGNOSIS — C541 Malignant neoplasm of endometrium: Secondary | ICD-10-CM

## 2022-12-13 DIAGNOSIS — E1122 Type 2 diabetes mellitus with diabetic chronic kidney disease: Secondary | ICD-10-CM | POA: Diagnosis not present

## 2022-12-13 DIAGNOSIS — N1831 Chronic kidney disease, stage 3a: Secondary | ICD-10-CM | POA: Diagnosis not present

## 2022-12-13 DIAGNOSIS — N12 Tubulo-interstitial nephritis, not specified as acute or chronic: Secondary | ICD-10-CM | POA: Diagnosis not present

## 2022-12-13 DIAGNOSIS — I129 Hypertensive chronic kidney disease with stage 1 through stage 4 chronic kidney disease, or unspecified chronic kidney disease: Secondary | ICD-10-CM | POA: Diagnosis not present

## 2022-12-13 MED ORDER — OXYCODONE HCL 5 MG PO CAPS
5.0000 mg | ORAL_CAPSULE | ORAL | 0 refills | Status: DC | PRN
Start: 2022-12-13 — End: 2023-03-27

## 2022-12-14 ENCOUNTER — Telehealth: Payer: Self-pay | Admitting: Family Medicine

## 2022-12-14 ENCOUNTER — Telehealth: Payer: Self-pay | Admitting: Student

## 2022-12-14 ENCOUNTER — Telehealth: Payer: Self-pay | Admitting: Cardiovascular Disease

## 2022-12-14 DIAGNOSIS — I129 Hypertensive chronic kidney disease with stage 1 through stage 4 chronic kidney disease, or unspecified chronic kidney disease: Secondary | ICD-10-CM | POA: Diagnosis not present

## 2022-12-14 DIAGNOSIS — N1831 Chronic kidney disease, stage 3a: Secondary | ICD-10-CM | POA: Diagnosis not present

## 2022-12-14 DIAGNOSIS — E1122 Type 2 diabetes mellitus with diabetic chronic kidney disease: Secondary | ICD-10-CM | POA: Diagnosis not present

## 2022-12-14 DIAGNOSIS — C541 Malignant neoplasm of endometrium: Secondary | ICD-10-CM | POA: Diagnosis not present

## 2022-12-14 DIAGNOSIS — D638 Anemia in other chronic diseases classified elsewhere: Secondary | ICD-10-CM | POA: Diagnosis not present

## 2022-12-14 DIAGNOSIS — I82501 Chronic embolism and thrombosis of unspecified deep veins of right lower extremity: Secondary | ICD-10-CM | POA: Diagnosis not present

## 2022-12-14 DIAGNOSIS — N12 Tubulo-interstitial nephritis, not specified as acute or chronic: Secondary | ICD-10-CM | POA: Diagnosis not present

## 2022-12-14 DIAGNOSIS — T83512D Infection and inflammatory reaction due to nephrostomy catheter, subsequent encounter: Secondary | ICD-10-CM | POA: Diagnosis not present

## 2022-12-14 DIAGNOSIS — N390 Urinary tract infection, site not specified: Secondary | ICD-10-CM | POA: Diagnosis not present

## 2022-12-14 DIAGNOSIS — A4150 Gram-negative sepsis, unspecified: Secondary | ICD-10-CM | POA: Diagnosis not present

## 2022-12-14 NOTE — Telephone Encounter (Signed)
Pt c/o medication issue:  1. Name of Medication:   ciprofloxacin (CIPRO) 500 MG tablet    warfarin (COUMADIN) 5 MG tablet    2. How are you currently taking this medication (dosage and times per day)? Take 1 and 1/2 tablets (750 mg) by mouth daily with breakfast for 5 days.   Take 1 tablet (5 mg) by mouth daily.   3. Are you having a reaction (difficulty breathing--STAT)? No  4. What is your medication issue? Jheffrey the pt's PT wanted to make office aware of medication interaction between the two above medications. He stated if any recommendations call [phone]. Please advise

## 2022-12-14 NOTE — Telephone Encounter (Signed)
Jaime Gomez from [facility] called to say he just did her nursing home evaluation.  Patient reports having a fever of 99 to 101 for the past two nights.  She has been taking Tylenol and ibuprofen for it.  She has been having chills and dizziness with the fever.  The fever starts and dinner time and breaks in the morning around 4am.  She is not having any SOB or chest pain.  Her fever this morning was 98.8. He reports her nephrostomy is clear, no foul order. Her fasting blood sugar this morning was 189.  He wanted to know if there was anything more we wanted to do in regards to her fever.

## 2022-12-14 NOTE — Telephone Encounter (Signed)
Spoke with patrick and advised this should go to the patient PCP.  He states he only called [place] because the only number he saw.  Gave him the information for her PCP and he will call them to follow up

## 2022-12-14 NOTE — Telephone Encounter (Signed)
Discussed with this PT as well as the one earlier, to call patietn PCP.  She is not established at this time.  We will see her in June.

## 2022-12-15 ENCOUNTER — Inpatient Hospital Stay: Payer: 59

## 2022-12-15 ENCOUNTER — Encounter: Payer: Self-pay | Admitting: Family

## 2022-12-15 ENCOUNTER — Inpatient Hospital Stay (HOSPITAL_BASED_OUTPATIENT_CLINIC_OR_DEPARTMENT_OTHER): Payer: 59 | Admitting: Family

## 2022-12-15 ENCOUNTER — Other Ambulatory Visit: Payer: Self-pay

## 2022-12-15 VITALS — BP 177/91 | HR 93 | Temp 98.2°F | Resp 20

## 2022-12-15 DIAGNOSIS — N1831 Chronic kidney disease, stage 3a: Secondary | ICD-10-CM

## 2022-12-15 DIAGNOSIS — E119 Type 2 diabetes mellitus without complications: Secondary | ICD-10-CM | POA: Diagnosis not present

## 2022-12-15 DIAGNOSIS — G629 Polyneuropathy, unspecified: Secondary | ICD-10-CM | POA: Diagnosis not present

## 2022-12-15 DIAGNOSIS — C541 Malignant neoplasm of endometrium: Secondary | ICD-10-CM

## 2022-12-15 DIAGNOSIS — C530 Malignant neoplasm of endocervix: Secondary | ICD-10-CM

## 2022-12-15 DIAGNOSIS — D509 Iron deficiency anemia, unspecified: Secondary | ICD-10-CM | POA: Diagnosis not present

## 2022-12-15 LAB — SAMPLE TO BLOOD BANK

## 2022-12-15 LAB — CMP (CANCER CENTER ONLY)
ALT: <5 U/L (ref 0–44)
AST: 11 U/L — ABNORMAL LOW (ref 15–41)
Albumin: 3.4 g/dL — ABNORMAL LOW (ref 3.5–5.0)
Alkaline Phosphatase: 83 U/L (ref 38–126)
Anion gap: 10 (ref 5–15)
BUN: 50 mg/dL — ABNORMAL HIGH (ref 6–20)
CO2: 23 mmol/L (ref 22–32)
Calcium: 9 mg/dL (ref 8.9–10.3)
Chloride: 107 mmol/L (ref 98–111)
Creatinine: 3.53 mg/dL — ABNORMAL HIGH (ref 0.44–1.00)
GFR, Estimated: 14 mL/min — ABNORMAL LOW (ref >60)
Glucose, Bld: 135 mg/dL — ABNORMAL HIGH (ref 70–99)
Potassium: 5.3 mmol/L — ABNORMAL HIGH (ref 3.5–5.1)
Sodium: 140 mmol/L (ref 135–145)
Total Bilirubin: 0.5 mg/dL (ref 0.3–1.2)
Total Protein: 6.8 g/dL (ref 6.5–8.1)

## 2022-12-15 LAB — PREALBUMIN: Prealbumin: 18 mg/dL (ref 18–38)

## 2022-12-15 LAB — CBC WITH DIFFERENTIAL (CANCER CENTER ONLY)
Abs Immature Granulocytes: 0.03 10*3/uL (ref 0.00–0.07)
Basophils Absolute: 0 10*3/uL (ref 0.0–0.1)
Basophils Relative: 1 %
Eosinophils Absolute: 0.1 10*3/uL (ref 0.0–0.5)
Eosinophils Relative: 2 %
HCT: 30.5 % — ABNORMAL LOW (ref 36.0–46.0)
Hemoglobin: 9.4 g/dL — ABNORMAL LOW (ref 12.0–15.0)
Immature Granulocytes: 1 %
Lymphocytes Relative: 11 %
Lymphs Abs: 0.4 10*3/uL — ABNORMAL LOW (ref 0.7–4.0)
MCH: 28.8 pg (ref 26.0–34.0)
MCHC: 30.8 g/dL (ref 30.0–36.0)
MCV: 93.6 fL (ref 80.0–100.0)
Monocytes Absolute: 0.4 10*3/uL (ref 0.1–1.0)
Monocytes Relative: 11 %
Neutro Abs: 2.8 10*3/uL (ref 1.7–7.7)
Neutrophils Relative %: 74 %
Platelet Count: 191 10*3/uL (ref 150–400)
RBC: 3.26 MIL/uL — ABNORMAL LOW (ref 3.87–5.11)
RDW: 17 % — ABNORMAL HIGH (ref 11.5–15.5)
WBC Count: 3.7 10*3/uL — ABNORMAL LOW (ref 4.0–10.5)
nRBC: 0 % (ref 0.0–0.2)

## 2022-12-15 LAB — LACTATE DEHYDROGENASE: LDH: 229 U/L — ABNORMAL HIGH (ref 98–192)

## 2022-12-15 LAB — PROTIME-INR
INR: 1.8 — ABNORMAL HIGH (ref 0.8–1.2)
Prothrombin Time: 21 seconds — ABNORMAL HIGH (ref 11.4–15.2)

## 2022-12-15 MED ORDER — GABAPENTIN 300 MG PO CAPS
300.0000 mg | ORAL_CAPSULE | Freq: Three times a day (TID) | ORAL | 3 refills | Status: DC
Start: 2022-12-15 — End: 2023-02-14

## 2022-12-15 NOTE — Progress Notes (Signed)
Hematology and Oncology Follow Up Visit  Jaime Gomez [id number] Sep 09, 1965 57 y.o. 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 She is not feeling well and is quite tearful during our visit.  She was hospitalized for 2 weeks earlier this month. She had sepsis, CVA, Takotsubo cardiomyopathy, encephalopathy and chronic RLE DVT.  Dr. Myna Hidalgo also mentioned possible neurotoxicity with antibiotics.  She states that her feet are hurting with the neuropathy. This seems to have increased today.  She has some mild swelling in her feet but pedal pulses are stable at 2+. No redness or edema.  No falls or syncope.  She feels SOB with bending over.  She notes chills. No fever, n/v, cough, rash, dizziness, chest pain, palpitations, abdominal pain or changes in bowel or bladder habits.  Appetite and hydration are fair.   ECOG Performance Status: 1 - Symptomatic but completely ambulatory  Medications:  Allergies as of 12/15/2022       Reactions   Taxotere [docetaxel] Swelling   Throat swelling        Medication List        Accurate as of Dec 15, 2022  3:28 PM. If you have any questions, ask your nurse or doctor.          acetaminophen 500 MG tablet Commonly known as: TYLENOL Take 500 mg by mouth every 6 (six) hours as needed for mild pain.   carvedilol 12.5 MG tablet Commonly known as: Coreg Take 1 tablet (12.5 mg total) by mouth 2 (two) times daily with a meal.   ciprofloxacin 500 MG tablet Commonly known as: Cipro Take 1 and 1/2 tablets (750 mg) by mouth daily with breakfast for 5 days.   enoxaparin 60 MG/0.6ML injection Commonly known as:  LOVENOX Inject 0.6 mLs (60 mg) into the skin daily for 2 days.   everolimus 10 MG tablet Commonly known as: AFINITOR Take 10 mg by mouth daily.   gabapentin 300 MG capsule Commonly known as: NEURONTIN Take 1 capsule (300 mg) by mouth 2 times daily.   insulin detemir 100 UNIT/ML injection Commonly known as: LEVEMIR Inject 0.2 mLs (20 Units total) into the skin 2 (two) times daily.   mirabegron ER 25 MG Tb24 tablet Commonly known as: Myrbetriq Take 1 tablet (25 mg total) by mouth daily for 360 doses.   NovoLOG FlexPen 100 UNIT/ML FlexPen Generic drug: insulin aspart Inject 2 Units into the skin 3 times daily with meals.   oxybutynin 10 MG 24 hr tablet Commonly known as: DITROPAN-XL Take 10 mg by mouth daily.   oxycodone 5 MG capsule Commonly known as: OXY-IR Take 1 capsule (5 mg total) by mouth every 4 (four) hours as needed for pain.   polyethylene glycol 17 g packet Commonly known as: MIRALAX / GLYCOLAX Take 17 g by mouth daily as needed for mild constipation.   prochlorperazine 10 MG tablet Commonly known as: COMPAZINE Take 10 mg by mouth every 6 (six) hours as needed for nausea or vomiting.   warfarin 5 MG tablet Commonly known as: COUMADIN Take 1 tablet (5 mg) by mouth daily.        Allergies:  Allergies  Allergen Reactions   Taxotere [  Docetaxel] Swelling    Throat swelling    Past Medical History, Surgical history, Social history, and Family History were reviewed and updated.  Review of Systems: All other 10 point review of systems is negative.   Physical Exam:  temperature is 98.2 F (36.8 C). Her blood pressure is 177/91 (abnormal) and her pulse is 93. Her respiration is 20 and oxygen saturation is 96%.   Wt Readings from Last 3 Encounters:  12/08/22 139 lb 12.8 oz (63.4 kg)  12/03/22 134 lb (60.8 kg)  11/16/22 134 lb (60.8 kg)    Ocular: Sclerae unicteric, pupils equal, round and reactive to light Ear-nose-throat: Oropharynx clear,  dentition fair Lymphatic: No cervical or supraclavicular adenopathy Lungs no rales or rhonchi, good excursion bilaterally Heart regular rate and rhythm, no murmur appreciated Abd soft, nontender, positive bowel sounds MSK no focal spinal tenderness, no joint edema Neuro: non-focal, well-oriented, appropriate affect Breasts: Deferred   Lab Results  Component Value Date   WBC 3.7 (L) 12/15/2022   HGB 9.4 (L) 12/15/2022   HCT 30.5 (L) 12/15/2022   MCV 93.6 12/15/2022   PLT 191 12/15/2022   Lab Results  Component Value Date   FERRITIN 1,812 (H) 11/16/2022   IRON 28 11/16/2022   TIBC 211 (L) 11/16/2022   UIBC 183 11/16/2022   IRONPCTSAT 13 11/16/2022   Lab Results  Component Value Date   RETICCTPCT 0.7 10/11/2022   RBC 3.26 (L) 12/15/2022   No results found for: "KPAFRELGTCHN", "LAMBDASER", "KAPLAMBRATIO" No results found for: "IGGSERUM", "IGA", "IGMSERUM" No results found for: "TOTALPROTELP", "ALBUMINELP", "A1GS", "A2GS", "BETS", "BETA2SER", "GAMS", "MSPIKE", "SPEI"   Chemistry      Component Value Date/Time   NA 140 12/15/2022 1419   K 5.3 (H) 12/15/2022 1419   CL 107 12/15/2022 1419   CO2 23 12/15/2022 1419   BUN 50 (H) 12/15/2022 1419   CREATININE 3.53 (H) 12/15/2022 1419      Component Value Date/Time   CALCIUM 9.0 12/15/2022 1419   ALKPHOS 83 12/15/2022 1419   AST 11 (L) 12/15/2022 1419   ALT <5 12/15/2022 1419   BILITOT 0.5 12/15/2022 1419       Impression and Plan: Jaime Gomez is a 57 yo caucasian female with metastatic endometrial cancer. She is no longer on Afinitor or Letrozole.  As mentioned above she was hospitalized earlier this month with multiple health problems.  She was discussed at length with Dr. Myna Hidalgo and we will have her stop the Cipro and Afinitor.  INR is 1.8. We will have her alternate 5 mg and 10 mg PO daily.  Lab check next week on Wednesday, follow-up in 2 weeks with MD.   Jaime Stanford, Jaime Gomez 5/24/20243:28 PM

## 2022-12-17 LAB — CA 125: Cancer Antigen (CA) 125: 282 U/mL — ABNORMAL HIGH (ref 0.0–38.1)

## 2022-12-19 ENCOUNTER — Other Ambulatory Visit: Payer: Self-pay | Admitting: Family

## 2022-12-19 ENCOUNTER — Telehealth: Payer: Self-pay | Admitting: Cardiovascular Disease

## 2022-12-19 DIAGNOSIS — T83512D Infection and inflammatory reaction due to nephrostomy catheter, subsequent encounter: Secondary | ICD-10-CM | POA: Diagnosis not present

## 2022-12-19 DIAGNOSIS — N390 Urinary tract infection, site not specified: Secondary | ICD-10-CM | POA: Diagnosis not present

## 2022-12-19 DIAGNOSIS — I82501 Chronic embolism and thrombosis of unspecified deep veins of right lower extremity: Secondary | ICD-10-CM

## 2022-12-19 DIAGNOSIS — C541 Malignant neoplasm of endometrium: Secondary | ICD-10-CM | POA: Diagnosis not present

## 2022-12-19 DIAGNOSIS — E1122 Type 2 diabetes mellitus with diabetic chronic kidney disease: Secondary | ICD-10-CM | POA: Diagnosis not present

## 2022-12-19 DIAGNOSIS — N1831 Chronic kidney disease, stage 3a: Secondary | ICD-10-CM | POA: Diagnosis not present

## 2022-12-19 DIAGNOSIS — A4150 Gram-negative sepsis, unspecified: Secondary | ICD-10-CM | POA: Diagnosis not present

## 2022-12-19 DIAGNOSIS — N12 Tubulo-interstitial nephritis, not specified as acute or chronic: Secondary | ICD-10-CM | POA: Diagnosis not present

## 2022-12-19 DIAGNOSIS — I129 Hypertensive chronic kidney disease with stage 1 through stage 4 chronic kidney disease, or unspecified chronic kidney disease: Secondary | ICD-10-CM | POA: Diagnosis not present

## 2022-12-19 DIAGNOSIS — D638 Anemia in other chronic diseases classified elsewhere: Secondary | ICD-10-CM | POA: Diagnosis not present

## 2022-12-19 NOTE — Telephone Encounter (Signed)
OK to order OT. Thanks

## 2022-12-19 NOTE — Telephone Encounter (Signed)
Molly Maduro was given verbal orders for OT per Dr. Royann Shivers.

## 2022-12-19 NOTE — Telephone Encounter (Signed)
Orders placed for OT referral. Orders received over the phone- [facility]

## 2022-12-19 NOTE — Addendum Note (Signed)
Addended by: Scheryl Marten on: 12/19/2022 12:17 PM   Modules accepted: Orders

## 2022-12-19 NOTE — Telephone Encounter (Signed)
Robert from [facility] called for verbal orders for OT services for strengthening and activities of daily living and safety, following her hospitalization, 2x a week for 3 weeks and then 1x a week for 1 week beginning today 12/19/22.

## 2022-12-20 ENCOUNTER — Encounter: Payer: Self-pay | Admitting: Family

## 2022-12-20 ENCOUNTER — Inpatient Hospital Stay: Payer: 59

## 2022-12-20 DIAGNOSIS — N390 Urinary tract infection, site not specified: Secondary | ICD-10-CM | POA: Diagnosis not present

## 2022-12-20 DIAGNOSIS — N1831 Chronic kidney disease, stage 3a: Secondary | ICD-10-CM | POA: Diagnosis not present

## 2022-12-20 DIAGNOSIS — I82501 Chronic embolism and thrombosis of unspecified deep veins of right lower extremity: Secondary | ICD-10-CM | POA: Diagnosis not present

## 2022-12-20 DIAGNOSIS — D509 Iron deficiency anemia, unspecified: Secondary | ICD-10-CM | POA: Diagnosis not present

## 2022-12-20 DIAGNOSIS — N12 Tubulo-interstitial nephritis, not specified as acute or chronic: Secondary | ICD-10-CM | POA: Diagnosis not present

## 2022-12-20 DIAGNOSIS — I129 Hypertensive chronic kidney disease with stage 1 through stage 4 chronic kidney disease, or unspecified chronic kidney disease: Secondary | ICD-10-CM | POA: Diagnosis not present

## 2022-12-20 DIAGNOSIS — T83512D Infection and inflammatory reaction due to nephrostomy catheter, subsequent encounter: Secondary | ICD-10-CM | POA: Diagnosis not present

## 2022-12-20 DIAGNOSIS — E1122 Type 2 diabetes mellitus with diabetic chronic kidney disease: Secondary | ICD-10-CM | POA: Diagnosis not present

## 2022-12-20 DIAGNOSIS — D638 Anemia in other chronic diseases classified elsewhere: Secondary | ICD-10-CM | POA: Diagnosis not present

## 2022-12-20 DIAGNOSIS — C541 Malignant neoplasm of endometrium: Secondary | ICD-10-CM | POA: Diagnosis not present

## 2022-12-20 DIAGNOSIS — A4150 Gram-negative sepsis, unspecified: Secondary | ICD-10-CM | POA: Diagnosis not present

## 2022-12-20 LAB — CBC WITH DIFFERENTIAL (CANCER CENTER ONLY)
Abs Immature Granulocytes: 0.05 10*3/uL (ref 0.00–0.07)
Basophils Absolute: 0 10*3/uL (ref 0.0–0.1)
Basophils Relative: 1 %
Eosinophils Absolute: 0.3 10*3/uL (ref 0.0–0.5)
Eosinophils Relative: 7 %
HCT: 26.2 % — ABNORMAL LOW (ref 36.0–46.0)
Hemoglobin: 8 g/dL — ABNORMAL LOW (ref 12.0–15.0)
Immature Granulocytes: 1 %
Lymphocytes Relative: 16 %
Lymphs Abs: 0.6 10*3/uL — ABNORMAL LOW (ref 0.7–4.0)
MCH: 29.1 pg (ref 26.0–34.0)
MCHC: 30.5 g/dL (ref 30.0–36.0)
MCV: 95.3 fL (ref 80.0–100.0)
Monocytes Absolute: 0.4 10*3/uL (ref 0.1–1.0)
Monocytes Relative: 10 %
Neutro Abs: 2.4 10*3/uL (ref 1.7–7.7)
Neutrophils Relative %: 65 %
Platelet Count: 221 10*3/uL (ref 150–400)
RBC: 2.75 MIL/uL — ABNORMAL LOW (ref 3.87–5.11)
RDW: 17.1 % — ABNORMAL HIGH (ref 11.5–15.5)
WBC Count: 3.6 10*3/uL — ABNORMAL LOW (ref 4.0–10.5)
nRBC: 0 % (ref 0.0–0.2)

## 2022-12-20 LAB — PROTIME-INR
INR: 2.5 — ABNORMAL HIGH (ref 0.8–1.2)
Prothrombin Time: 27 seconds — ABNORMAL HIGH (ref 11.4–15.2)

## 2022-12-21 DIAGNOSIS — E1122 Type 2 diabetes mellitus with diabetic chronic kidney disease: Secondary | ICD-10-CM | POA: Diagnosis not present

## 2022-12-21 DIAGNOSIS — A4150 Gram-negative sepsis, unspecified: Secondary | ICD-10-CM | POA: Diagnosis not present

## 2022-12-21 DIAGNOSIS — I129 Hypertensive chronic kidney disease with stage 1 through stage 4 chronic kidney disease, or unspecified chronic kidney disease: Secondary | ICD-10-CM | POA: Diagnosis not present

## 2022-12-21 DIAGNOSIS — D638 Anemia in other chronic diseases classified elsewhere: Secondary | ICD-10-CM | POA: Diagnosis not present

## 2022-12-21 DIAGNOSIS — I82501 Chronic embolism and thrombosis of unspecified deep veins of right lower extremity: Secondary | ICD-10-CM | POA: Diagnosis not present

## 2022-12-21 DIAGNOSIS — N390 Urinary tract infection, site not specified: Secondary | ICD-10-CM | POA: Diagnosis not present

## 2022-12-21 DIAGNOSIS — N12 Tubulo-interstitial nephritis, not specified as acute or chronic: Secondary | ICD-10-CM | POA: Diagnosis not present

## 2022-12-21 DIAGNOSIS — T83512D Infection and inflammatory reaction due to nephrostomy catheter, subsequent encounter: Secondary | ICD-10-CM | POA: Diagnosis not present

## 2022-12-21 DIAGNOSIS — C541 Malignant neoplasm of endometrium: Secondary | ICD-10-CM | POA: Diagnosis not present

## 2022-12-21 DIAGNOSIS — N1831 Chronic kidney disease, stage 3a: Secondary | ICD-10-CM | POA: Diagnosis not present

## 2022-12-22 DIAGNOSIS — I82501 Chronic embolism and thrombosis of unspecified deep veins of right lower extremity: Secondary | ICD-10-CM | POA: Diagnosis not present

## 2022-12-22 DIAGNOSIS — N12 Tubulo-interstitial nephritis, not specified as acute or chronic: Secondary | ICD-10-CM | POA: Diagnosis not present

## 2022-12-22 DIAGNOSIS — D638 Anemia in other chronic diseases classified elsewhere: Secondary | ICD-10-CM | POA: Diagnosis not present

## 2022-12-22 DIAGNOSIS — A4150 Gram-negative sepsis, unspecified: Secondary | ICD-10-CM | POA: Diagnosis not present

## 2022-12-22 DIAGNOSIS — E1122 Type 2 diabetes mellitus with diabetic chronic kidney disease: Secondary | ICD-10-CM | POA: Diagnosis not present

## 2022-12-22 DIAGNOSIS — I129 Hypertensive chronic kidney disease with stage 1 through stage 4 chronic kidney disease, or unspecified chronic kidney disease: Secondary | ICD-10-CM | POA: Diagnosis not present

## 2022-12-22 DIAGNOSIS — N1831 Chronic kidney disease, stage 3a: Secondary | ICD-10-CM | POA: Diagnosis not present

## 2022-12-22 DIAGNOSIS — T83512D Infection and inflammatory reaction due to nephrostomy catheter, subsequent encounter: Secondary | ICD-10-CM | POA: Diagnosis not present

## 2022-12-22 DIAGNOSIS — C541 Malignant neoplasm of endometrium: Secondary | ICD-10-CM | POA: Diagnosis not present

## 2022-12-22 DIAGNOSIS — N390 Urinary tract infection, site not specified: Secondary | ICD-10-CM | POA: Diagnosis not present

## 2022-12-24 NOTE — Progress Notes (Signed)
Cardiology Office Note:    Date:  01/01/2023   ID:  Jaime Gomez, DOB 1966-02-04, MRN [id number]  PCP:  Karie Georges, MD  Cardiologist:  Thurmon Fair, MD  Electrophysiologist:  None   Referring MD: Staci Acosta, FNP   Chief Complaint: hospital follow-up of stress-induced cardiomyopathy  History of Present Illness:    Jaime Gomez is a 57 y.o. 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 Royann Shivers and presents today for hospital follow-up of stress-induced cardiomyopathy.   Patient was first seen by Cardiology during recent hospitalization. She was admitted from 11/28/2022 to 12/12/2022 sepsis secondary to UTI as well as AKI superimposed with creatinine of 4.92 on admission. She underwent nephrostomy tube exchange on 5/8. Blood cultures grew pseudomonas. She was started on IV antibiotics and well as IV fluids and sodium bicarb. Hospitalization has been complicated AKI, metabolic encephalopathy, acute vs subactue CVA, and acute on chronic anemia requiring blood transfusion. Brain MRI showed acute or early subactue left frontoparietal infarction with additional punctate acute infarcts in the cerebellum and bilateral occipital lobes. Neurology was consulted and felt etiology was likely cardioembolic vs secondary hypercoagulable state of malignancy vs atheroembolic from potential critical vessel stenosis. Altered mental status was ultimately felt to be due to cefepime toxicity and mental status returned to baseline after this was discontinued. Echo was ordered and showed LVEF of 30-35% with global hypokinesis and apical akinesis as well as  grade 1 diastolic dysfunction. Cardiomyopathy felt to most likely be stress-induced giving acute illness. She was not felt to be a good cardiac catheterization given AKI and altered mental status. Repeat limited Echo prior to discharge showed improvement in LVEF to 45-50% with global hypokinesis. GDMT was limited by renal function and soft BP. She was initially started on Bidil but this was discontinued prior to discharge due to soft BP and she was discharge on Coreg alone which she was already on at home.   Patient presents today for follow-up. Patient is here today with a care taker. She looks so much better than she did the last time I saw her in the hospital. She denies any chest pain, shortness of breath, orthopnea, or PND. She has had some lower extremity edema since being discharged. Her PCP started PRN Lasix last week. She has taken 2 doses of this so far with improvement. Her right leg is larger than the left but this is the one with the chronic DVT. She reports some dizziness if she is sitting for long periods of times but this is not new and is stable. No falls or syncope.  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 failure 08/18/2021   Diabetes mellitus without complication (HCC)    Endometrial cancer, FIGO stage IVB (HCC) 08/18/2021   Goals of care, counseling/discussion 08/18/2021   History of kidney stones    Hypertension    Leg DVT (deep venous thromboembolism), chronic, right (HCC) 08/18/2021   Ovarian cancer (HCC) 2013   Pneumonia     Past Surgical History:  Procedure Laterality Date   ABDOMINAL HYSTERECTOMY     CYSTOSCOPY W/ URETERAL STENT PLACEMENT Bilateral 09/01/2021   Procedure: CYSTOSCOPY WITH RETROGRADE PYELOGRAM/URETERAL  STENT PLACEMENT;  Surgeon: Jannifer Hick, MD;  Location: [facility];  Service: Urology;  Laterality: Bilateral;   CYSTOSCOPY WITH STENT PLACEMENT Right 05/19/2022   Procedure: CYSTOSCOPY WITH STENT CHANGE;  Surgeon: Jannifer Hick,  MD;  Location: [facility];  Service: Urology;  Laterality: Right;  ONLY NEEDS 30 MIN   HERNIA REPAIR     IR CONVERT LEFT NEPHROSTOMY TO NEPHROURETERAL CATH  11/02/2021   IR CV LINE INJECTION  08/23/2021   IR IMAGING GUIDED PORT INSERTION  11/14/2021   IR NEPHROSTOMY EXCHANGE LEFT  11/02/2021   IR NEPHROSTOMY EXCHANGE LEFT  01/20/2022   IR NEPHROSTOMY EXCHANGE LEFT  11/29/2022   IR NEPHROSTOMY PLACEMENT LEFT  09/03/2021   IR REMOVAL TUN ACCESS W/ PORT W/O FL MOD SED  11/14/2021   IR REMOVAL TUN ACCESS W/ PORT W/O FL MOD SED  12/04/2022    Current Medications: Current Meds  Medication Sig   acetaminophen (TYLENOL) 500 MG tablet Take 500 mg by mouth every 6 (six) hours as needed for mild pain.   carvedilol (COREG) 12.5 MG tablet Take 1 tablet (12.5 mg total) by mouth 2 (two) times daily with a meal.   Continuous Glucose Sensor (FREESTYLE LIBRE 3 SENSOR) MISC 1 Act by Does not apply route daily. Place 1 sensor on the skin every 14 days. Use to check glucose continuously   furosemide (LASIX) 20 MG tablet Take 20 mg by mouth as needed for fluid or edema.   gabapentin (NEURONTIN) 300 MG capsule Take 1 capsule (300 mg total) by mouth 3 (three) times daily.   insulin aspart (NOVOLOG) 100 UNIT/ML FlexPen Inject 2 Units into the skin 3 times daily with meals.   insulin detemir (LEVEMIR) 100 UNIT/ML injection Inject 0.2 mLs (20 Units total) into the skin 2 (two) times daily.   oxybutynin (DITROPAN-XL) 10 MG 24 hr tablet Take 10 mg by mouth daily.   oxycodone (OXY-IR) 5 MG capsule Take 1 capsule (5 mg total) by mouth every 4 (four) hours as needed for pain.   polyethylene glycol (MIRALAX / GLYCOLAX) 17 g packet Take 17 g by mouth daily as needed for mild constipation.   prochlorperazine (COMPAZINE) 10 MG tablet Take 10 mg by mouth every 6 (six) hours as needed for nausea or vomiting.   venlafaxine XR (EFFEXOR XR) 37.5 MG 24 hr capsule Take 1 capsule (37.5 mg total) by mouth daily with breakfast.   warfarin  (COUMADIN) 5 MG tablet Take 1 tablet (5 mg) by mouth daily.     Allergies:   Taxotere [docetaxel]   Social History   Socioeconomic History   Marital status: Single    Spouse name: Not on file   Number of children: Not on file   Years of education: Not on file   Highest education level: Not on file  Occupational History   Not on file  Tobacco Use   Smoking status: Never   Smokeless tobacco: Never  Vaping Use   Vaping Use: Never used  Substance and Sexual Activity   Alcohol use: Never   Drug use: Never   Sexual activity: Not Currently  Other Topics Concern   Not on file  Social History Narrative   Not on file   Social Determinants of Health   Financial Resource Strain: Low Risk  (06/27/2022)   Overall Financial Resource Strain (CARDIA)    Difficulty of Paying Living Expenses: Not hard at all  Food Insecurity: No Food Insecurity (11/28/2022)   Hunger Vital Sign  Worried About Programme researcher, broadcasting/film/video in the Last Year: Never true    Ran Out of Food in the Last Year: Never true  Transportation Needs: No Transportation Needs (11/28/2022)   PRAPARE - Administrator, Civil Service (Medical): No    Lack of Transportation (Non-Medical): No  Physical Activity: Not on file  Stress: No Stress Concern Present (06/27/2022)   [employer] of Occupational Health - Occupational Stress Questionnaire    Feeling of Stress : Not at all  Social Connections: Not on file     Family History: The patient's family history includes Alcohol abuse in her father; Arthritis in her mother and sister; COPD in her mother; Depression in her brother and mother; Diabetes in her mother and sister; Drug abuse in her father; Heart attack in her mother; Heart disease in her mother and sister; Hyperlipidemia in her mother and sister; Miscarriages / Stillbirths in her sister.  ROS:   Please see the history of present illness.     EKGs/Labs/Other Studies Reviewed:    The following studies were  reviewed today:  Complete Echocardiogram 12/04/2022: Impressions: 1. Global hypokinesis with apical akinesis; overall moderate LV  dysfunction.   2. Left ventricular ejection fraction, by estimation, is 30 to 35%. The  left ventricle has moderately decreased function. The left ventricle  demonstrates regional wall motion abnormalities (see scoring  diagram/findings for description). The left  ventricular internal cavity size was mildly dilated. Left ventricular  diastolic parameters are consistent with Grade I diastolic dysfunction  (impaired relaxation).   3. Right ventricular systolic function is normal. The right ventricular  size is normal.   4. The mitral valve is normal in structure. Trivial mitral valve  regurgitation. No evidence of mitral stenosis.   5. The aortic valve is tricuspid. Aortic valve regurgitation is not  visualized. No aortic stenosis is present.   6. The inferior vena cava is normal in size with greater than 50%  respiratory variability, suggesting right atrial pressure of 3 mmHg.   Comparison(s): No prior Echocardiogram.  _______________  Limited Echocardiogram 12/11/2022: Impressions: 1. Left ventricular ejection fraction, by estimation, is 45 to 50%. The  left ventricle has mildly decreased function. The left ventricle  demonstrates global hypokinesis. The left ventricular internal cavity size  was mildly dilated.     EKG:  EKG not ordered today.   Recent Labs: 12/03/2022: B Natriuretic Peptide 2,665.1; TSH 2.520 12/06/2022: Magnesium 1.8 12/15/2022: ALT <5; BUN 50; Creatinine 3.53; Potassium 5.3; Sodium 140 12/29/2022: Hemoglobin 8.1; Platelet Count 206  Recent Lipid Panel    Component Value Date/Time   CHOL 162 12/04/2022 0232   TRIG 199 (H) 12/04/2022 0232   HDL 29 (L) 12/04/2022 0232   CHOLHDL 5.6 12/04/2022 0232   VLDL 40 12/04/2022 0232   LDLCALC 93 12/04/2022 0232    Physical Exam:    Vital Signs: BP 128/70 (BP Location: Left Arm,  Patient Position: Sitting, Cuff Size: Normal)   Pulse (!) 59   Ht 5\' 4"  (1.626 m)   Wt 141 lb 3.2 oz (64 kg)   SpO2 99%   BMI 24.24 kg/m     Wt Readings from Last 3 Encounters:  01/01/23 141 lb 3.2 oz (64 kg)  12/26/22 147 lb 3.2 oz (66.8 kg)  12/08/22 139 lb 12.8 oz (63.4 kg)     General: 57 y.o. Caucasian female in no acute distress. HEENT: Normocephalic and atraumatic. Sclera clear.  Neck: Supple. No carotid bruits. No JVD. Heart: RRR.  Distinct S1 and S2. No murmurs, gallops, or rubs.  Lungs: No increased work of breathing. Clear to ausculation bilaterally. No wheezes, rhonchi, or rales.  Abdomen: Soft, non-distended, and non-tender to palpation.  Extremities: 1+ pitting edema of bilateral lower extremities (right > left).  Skin: Warm and dry. Neuro: Alert and oriented x3. No focal deficits. Psych: Normal affect. Responds appropriately.   Assessment:    1. Stress-induced cardiomyopathy   2. Primary hypertension   3. Chronic deep vein thrombosis (DVT) of proximal vein of right lower extremity (HCC)   4. Chronic kidney disease (CKD), stage IV (severe) (HCC)     Plan:    Stressed-Induced Cardiomyopathy Patient was recently admitted in 11/2022 for sepsis secondary to UTI and AKI. Echo during admission showed LVEF of 30-35% with global hypokinesis and apical akinesis as well as grade 1 diastolic dysfunction. Cardiomyopathy felt to most likely be stress-induced giving acute illness. She was not felt to be a good cardiac catheterization given AKI and altered mental status. However, repeat limited Echo prior to discharge showed improvement in LVEF to 45-50% with global hypokinesis. - She has some lower extremity edema but otherwise euvolemic.  - PCP recently started Lasix 20mg  as needed for edema last week which has already helped. Continue PRN dosing.  - Continue Coreg 12.5mg  twice daily.  - She was briefly on Bidil during hospitalization but this was stopped prior to discharge  given soft BP. - No ACEi/ARB/ARNI, Spironolactone, or SGLT2 inhibitor given renal function.  - Discussed importance of daily weights and advised patient to let us know if she gains 3lbs in 1 day or 5 lbs in 1 week. Also discussed the importance of limiting sodium intake.   Hypertension BP well controlled.  - Continue Coreg 12.5mg  twice daily.  Chronic Right Lower Extremity DVT Previously on Arixtra but was switched to Coumadin during recent admission given renal function.  - Continue Coumadin. INR followed by Hem-Onc. INR was 2.4 on 12/29/2022.   CKD Stage IV Baseline creatinine around 3.0 prior to recent admission. Hospitalization was complicated by AKI and creatinine peaked at 5.15 during admission. Improved to 3.21 on discharge.  - Will recheck BMET today.  Disposition: Follow up in 3 months.   Medication Adjustments/Labs and Tests Ordered: Current medicines are reviewed at length with the patient today.  Concerns regarding medicines are outlined above.  Orders Placed This Encounter  Procedures   Basic metabolic panel   No orders of the defined types were placed in this encounter.   Patient Instructions  Medication Instructions:   CONTINUE to take Lasix 20 mg as needed for swelling   *If you need a refill on your cardiac medications before your next appointment, please call your pharmacy*  Lab Work: Your physician recommends that you return for lab work TODAY:  BMP  If you have labs (blood work) drawn today and your tests are completely normal, you will receive your results only by: MyChart Message (if you have MyChart) OR A paper copy in the mail If you have any lab test that is abnormal or we need to change your treatment, we will call you to review the results.  Testing/Procedures: NONE ordered at this time of appointment   Follow-Up: At [facility], you and your health needs are our priority.  As part of our continuing mission to provide you with  exceptional heart care, we have created designated Provider Care Teams.  These Care Teams include your primary Cardiologist (physician) and Advanced Practice Providers (  APPs -  Physician Assistants and Nurse Practitioners) who all work together to provide you with the care you need, when you need it.  Your next appointment:   3 month(s)  Provider:   Thurmon Fair, MD  or Marjie Skiff, PA-C        Other Instructions Heart Failure Education: Weigh yourself EVERY morning after you go to the bathroom but before you eat or drink anything. Write this number down in a weight log/diary. If you gain 3 pounds overnight or 5 pounds in a week, call the office. Take your medicines as prescribed. If you have concerns about your medications, please call us before you stop taking them.  Eat low salt foods--Limit salt (sodium) to 2000 mg per day. This will help prevent your body from holding onto fluid. Read food labels as many processed foods have a lot of sodium, especially canned goods and prepackaged meats. If you would like some assistance choosing low sodium foods, we would be happy to set you up with a nutritionist. Stay as active as you can everyday. Staying active will give you more energy and make your muscles stronger. Start with 5 minutes at a time and work your way up to 30 minutes a day. Break up your activities--do some in the morning and some in the afternoon. Start with 3 days per week and work your way up to 5 days as you can.  If you have chest pain, feel short of breath, dizzy, or lightheaded, STOP. If you don't feel better after a short rest, call 911. If you do feel better, call the office to let us know you have symptoms with exercise. Limit all fluids for the day to less than 2 liters. Fluid includes all drinks, coffee, juice, ice chips, soup, jello, and all other liquids.     Signed, Corrin Parker, PA-C  01/01/2023 1:04 PM    [facility]

## 2022-12-25 DIAGNOSIS — A4150 Gram-negative sepsis, unspecified: Secondary | ICD-10-CM | POA: Diagnosis not present

## 2022-12-25 DIAGNOSIS — I82501 Chronic embolism and thrombosis of unspecified deep veins of right lower extremity: Secondary | ICD-10-CM | POA: Diagnosis not present

## 2022-12-25 DIAGNOSIS — I129 Hypertensive chronic kidney disease with stage 1 through stage 4 chronic kidney disease, or unspecified chronic kidney disease: Secondary | ICD-10-CM | POA: Diagnosis not present

## 2022-12-25 DIAGNOSIS — N390 Urinary tract infection, site not specified: Secondary | ICD-10-CM | POA: Diagnosis not present

## 2022-12-25 DIAGNOSIS — E1122 Type 2 diabetes mellitus with diabetic chronic kidney disease: Secondary | ICD-10-CM | POA: Diagnosis not present

## 2022-12-25 DIAGNOSIS — N12 Tubulo-interstitial nephritis, not specified as acute or chronic: Secondary | ICD-10-CM | POA: Diagnosis not present

## 2022-12-25 DIAGNOSIS — N1831 Chronic kidney disease, stage 3a: Secondary | ICD-10-CM | POA: Diagnosis not present

## 2022-12-25 DIAGNOSIS — T83512D Infection and inflammatory reaction due to nephrostomy catheter, subsequent encounter: Secondary | ICD-10-CM | POA: Diagnosis not present

## 2022-12-25 DIAGNOSIS — D638 Anemia in other chronic diseases classified elsewhere: Secondary | ICD-10-CM | POA: Diagnosis not present

## 2022-12-25 DIAGNOSIS — C541 Malignant neoplasm of endometrium: Secondary | ICD-10-CM | POA: Diagnosis not present

## 2022-12-26 ENCOUNTER — Encounter: Payer: Self-pay | Admitting: Family Medicine

## 2022-12-26 ENCOUNTER — Ambulatory Visit: Payer: 59 | Admitting: Family Medicine

## 2022-12-26 VITALS — BP 100/50 | HR 60 | Temp 98.2°F | Ht 63.5 in | Wt 147.2 lb

## 2022-12-26 DIAGNOSIS — F321 Major depressive disorder, single episode, moderate: Secondary | ICD-10-CM

## 2022-12-26 DIAGNOSIS — E119 Type 2 diabetes mellitus without complications: Secondary | ICD-10-CM | POA: Diagnosis not present

## 2022-12-26 DIAGNOSIS — Z794 Long term (current) use of insulin: Secondary | ICD-10-CM | POA: Diagnosis not present

## 2022-12-26 DIAGNOSIS — N184 Chronic kidney disease, stage 4 (severe): Secondary | ICD-10-CM | POA: Diagnosis not present

## 2022-12-26 LAB — POCT GLYCOSYLATED HEMOGLOBIN (HGB A1C): HbA1c POC (<> result, manual entry): 6.2 % (ref 4.0–5.6)

## 2022-12-26 MED ORDER — FREESTYLE LIBRE 3 SENSOR MISC
1.0000 | Freq: Every day | 11 refills | Status: DC
Start: 2022-12-26 — End: 2023-06-04

## 2022-12-26 MED ORDER — FUROSEMIDE 20 MG PO TABS
20.0000 mg | ORAL_TABLET | Freq: Every day | ORAL | 0 refills | Status: DC
Start: 2022-12-26 — End: 2023-01-01

## 2022-12-26 MED ORDER — VENLAFAXINE HCL ER 37.5 MG PO CP24
37.5000 mg | ORAL_CAPSULE | Freq: Every day | ORAL | 1 refills | Status: DC
Start: 2022-12-26 — End: 2023-06-04

## 2022-12-26 NOTE — Progress Notes (Unsigned)
New Patient Office Visit  Subjective    Patient ID: Jaime Gomez, female    DOB: [birth year]  Age: 57 y.o. MRN: [id number]  CC:  Chief Complaint  Patient presents with   Establish Care    HPI Jaime Gomez presents to establish care Patient is reporting memory issues, states that she was told she had a small stroke in her language center of the brain, it was found during the hospital stay in May 2024. Did have acute memory and speech changes at the end of April. I reviewed her MRI of the brain, sister reports that she was supposed to be on blood thinners but it had looked like she wasn't taking them. Patient reports she continues to have memory issues. We discussed how the stroke was affecting her memory and that it would take more time to recover.  Pt has a history of ovarian and endometrial CA, was on treatment/ chemo for this however it was stopped temporarily due to the hospitalization for sepsis/ AKI in May 2024. Her CKD is stage 4, her GFR is around 14, she does not have a nephrologist. She does see a urologist, has nephrostomy tubes on both sides, sees Dr. Cardell Peach for this.   Currently is experiencing swelling in her ankles BL and they are very painful.. has been swelling since her hospital stay 1 month ago. States that   Current Outpatient Medications  Medication Instructions   acetaminophen (TYLENOL) 500 mg, Oral, Every 6 hours PRN   carvedilol (COREG) 12.5 mg, Oral, 2 times daily with meals   Continuous Glucose Sensor (FREESTYLE LIBRE 3 SENSOR) MISC 1 Act, Does not apply, Daily, Place 1 sensor on the skin every 14 days. Use to check glucose continuously   furosemide (LASIX) 20 mg, Oral, Daily   gabapentin (NEURONTIN) 300 mg, Oral, 3 times daily   insulin aspart (NOVOLOG) 100 UNIT/ML FlexPen Inject 2 Units into the skin 3 times daily with meals.   insulin detemir (LEVEMIR) 20 Units, Subcutaneous, 2 times daily   mirabegron ER (MYRBETRIQ) 25 mg, Oral, Daily   oxybutynin  (DITROPAN-XL) 10 mg, Oral, Daily   oxycodone (OXY-IR) 5 mg, Oral, Every 4 hours PRN   polyethylene glycol (MIRALAX / GLYCOLAX) 17 g, Oral, Daily PRN   prochlorperazine (COMPAZINE) 10 mg, Oral, Every 6 hours PRN   venlafaxine XR (EFFEXOR XR) 37.5 mg, Oral, Daily with breakfast   warfarin (COUMADIN) 5 MG tablet Take 1 tablet (5 mg) by mouth daily.    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 failure 08/18/2021   Diabetes mellitus without complication (HCC)    Endometrial cancer, FIGO stage IVB (HCC) 08/18/2021   Goals of care, counseling/discussion 08/18/2021   History of kidney stones    Hypertension    Leg DVT (deep venous thromboembolism), chronic, right (HCC) 08/18/2021   Ovarian cancer (HCC) 2013   Pneumonia     Past Surgical History:  Procedure Laterality Date   ABDOMINAL HYSTERECTOMY     CYSTOSCOPY W/ URETERAL STENT PLACEMENT Bilateral 09/01/2021   Procedure: CYSTOSCOPY WITH RETROGRADE PYELOGRAM/URETERAL STENT PLACEMENT;  Surgeon: Jannifer Hick, MD;  Location: [facility];  Service: Urology;  Laterality: Bilateral;   CYSTOSCOPY WITH STENT PLACEMENT Right 05/19/2022   Procedure: CYSTOSCOPY WITH STENT CHANGE;  Surgeon: Jannifer Hick, MD;  Location: [facility];  Service: Urology;  Laterality: Right;  ONLY NEEDS 30 MIN   HERNIA REPAIR     IR CONVERT LEFT NEPHROSTOMY TO NEPHROURETERAL CATH  11/02/2021  IR CV LINE INJECTION  08/23/2021   IR IMAGING GUIDED PORT INSERTION  11/14/2021   IR NEPHROSTOMY EXCHANGE LEFT  11/02/2021   IR NEPHROSTOMY EXCHANGE LEFT  01/20/2022   IR NEPHROSTOMY EXCHANGE LEFT  11/29/2022   IR NEPHROSTOMY PLACEMENT LEFT  09/03/2021   IR REMOVAL TUN ACCESS W/ PORT W/O FL MOD SED  11/14/2021   IR REMOVAL TUN ACCESS W/ PORT W/O FL MOD SED  12/04/2022    Family History  Problem Relation Age of Onset   Hyperlipidemia Mother    Heart disease Mother    Diabetes Mother    Depression Mother    COPD Mother    Arthritis Mother    Heart attack Mother     Drug abuse Father    Alcohol abuse Father    Hyperlipidemia Sister    Heart disease Sister    Diabetes Sister    Arthritis Sister    Miscarriages / [place] Sister    Depression Brother     Social History   Socioeconomic History   Marital status: Single    Spouse name: Not on file   Number of children: Not on file   Years of education: Not on file   Highest education level: Not on file  Occupational History   Not on file  Tobacco Use   Smoking status: Never   Smokeless tobacco: Never  Vaping Use   Vaping Use: Never used  Substance and Sexual Activity   Alcohol use: Never   Drug use: Never   Sexual activity: Not Currently  Other Topics Concern   Not on file  Social History Narrative   Not on file   Social Determinants of Health   Financial Resource Strain: Low Risk  (06/27/2022)   Overall Financial Resource Strain (CARDIA)    Difficulty of Paying Living Expenses: Not hard at all  Food Insecurity: No Food Insecurity (11/28/2022)   Hunger Vital Sign    Worried About Radiation protection practitioner of Food in the Last Year: Never true    Ran Out of Food in the Last Year: Never true  Transportation Needs: No Transportation Needs (11/28/2022)   PRAPARE - Administrator, Civil Service (Medical): No    Lack of Transportation (Non-Medical): No  Physical Activity: Not on file  Stress: No Stress Concern Present (06/27/2022)   [employer] of Occupational Health - Occupational Stress Questionnaire    Feeling of Stress : Not at all  Social Connections: Not on file  Intimate Partner Violence: Not At Risk (11/28/2022)   Humiliation, Afraid, Rape, and Kick questionnaire    Fear of Current or Ex-Partner: No    Emotionally Abused: No    Physically Abused: No    Sexually Abused: No    Review of Systems  All other systems reviewed and are negative.       Objective    BP (!) 100/50 (BP Location: Right Arm, Patient Position: Sitting, Cuff Size: Normal)   Pulse 60   Temp 98.2  F (36.8 C) (Oral)   Ht 5' 3.5" (1.613 m)   Wt 147 lb 3.2 oz (66.8 kg)   SpO2 98%   BMI 25.67 kg/m   Physical Exam  {Labs (Optional):23779}    Assessment & Plan:  CKD (chronic kidney disease) stage 4, GFR 15-29 ml/min (HCC) -     Ambulatory referral to Nephrology -     Furosemide; Take 1 tablet (20 mg total) by mouth daily.  Dispense: 30 tablet; Refill: 0  Depression,  major, single episode, moderate (HCC) -     Venlafaxine HCl ER; Take 1 capsule (37.5 mg total) by mouth daily with breakfast.  Dispense: 90 capsule; Refill: 1  Type 2 diabetes mellitus without complication, with long-term current use of insulin (HCC) -     POCT glycosylated hemoglobin (Hb A1C) -     FreeStyle Libre 3 Sensor; 1 Act by Does not apply route daily. Place 1 sensor on the skin every 14 days. Use to check glucose continuously  Dispense: 2 each; Refill: 11    Return in about 3 months (around 03/28/2023).   Karie Georges, MD

## 2022-12-27 ENCOUNTER — Other Ambulatory Visit: Payer: Self-pay | Admitting: Urology

## 2022-12-27 DIAGNOSIS — I82501 Chronic embolism and thrombosis of unspecified deep veins of right lower extremity: Secondary | ICD-10-CM | POA: Diagnosis not present

## 2022-12-27 DIAGNOSIS — N1831 Chronic kidney disease, stage 3a: Secondary | ICD-10-CM | POA: Diagnosis not present

## 2022-12-27 DIAGNOSIS — C541 Malignant neoplasm of endometrium: Secondary | ICD-10-CM | POA: Diagnosis not present

## 2022-12-27 DIAGNOSIS — A4150 Gram-negative sepsis, unspecified: Secondary | ICD-10-CM | POA: Diagnosis not present

## 2022-12-27 DIAGNOSIS — E1122 Type 2 diabetes mellitus with diabetic chronic kidney disease: Secondary | ICD-10-CM | POA: Diagnosis not present

## 2022-12-27 DIAGNOSIS — T83512D Infection and inflammatory reaction due to nephrostomy catheter, subsequent encounter: Secondary | ICD-10-CM | POA: Diagnosis not present

## 2022-12-27 DIAGNOSIS — N184 Chronic kidney disease, stage 4 (severe): Secondary | ICD-10-CM | POA: Insufficient documentation

## 2022-12-27 DIAGNOSIS — D638 Anemia in other chronic diseases classified elsewhere: Secondary | ICD-10-CM | POA: Diagnosis not present

## 2022-12-27 DIAGNOSIS — I129 Hypertensive chronic kidney disease with stage 1 through stage 4 chronic kidney disease, or unspecified chronic kidney disease: Secondary | ICD-10-CM | POA: Diagnosis not present

## 2022-12-27 DIAGNOSIS — N12 Tubulo-interstitial nephritis, not specified as acute or chronic: Secondary | ICD-10-CM | POA: Diagnosis not present

## 2022-12-27 DIAGNOSIS — N390 Urinary tract infection, site not specified: Secondary | ICD-10-CM | POA: Diagnosis not present

## 2022-12-27 NOTE — Assessment & Plan Note (Signed)
GFR is very low but has been this way for some time. I do think however she needs to see a nephrologist and establish a relationship now so that she can discuss her options for treatment. She has 2+ edema in her ankles currently so I will start her on furosemide 20 mg daily, we discussed that we will need to watch her fluids status carefully so that she does not drop her BP with this medication. Referring patient to nephrology

## 2022-12-27 NOTE — Assessment & Plan Note (Signed)
Pt is tearful and upset of her mutliple complicated chronic medical issues, we had a long talk about this. Will start patient on effexor 37.5 mg once daily and will see her back in 3 months to re-evaluate her symptoms.

## 2022-12-28 NOTE — Assessment & Plan Note (Signed)
On insulin, I have refilled her freestyle libre 3 sensors, A1C today in office is 6.2 which is well controlled, will continue to monitor this every 6 months. I will see her back after she sees the nephrologist.

## 2022-12-29 ENCOUNTER — Encounter: Payer: Self-pay | Admitting: *Deleted

## 2022-12-29 ENCOUNTER — Inpatient Hospital Stay: Payer: 59 | Attending: Hematology & Oncology

## 2022-12-29 ENCOUNTER — Telehealth: Payer: Self-pay | Admitting: Family Medicine

## 2022-12-29 ENCOUNTER — Inpatient Hospital Stay (HOSPITAL_BASED_OUTPATIENT_CLINIC_OR_DEPARTMENT_OTHER): Payer: 59 | Admitting: Family

## 2022-12-29 ENCOUNTER — Inpatient Hospital Stay: Payer: 59

## 2022-12-29 VITALS — BP 142/69 | HR 61 | Temp 97.5°F | Resp 20

## 2022-12-29 DIAGNOSIS — C541 Malignant neoplasm of endometrium: Secondary | ICD-10-CM | POA: Insufficient documentation

## 2022-12-29 DIAGNOSIS — A4150 Gram-negative sepsis, unspecified: Secondary | ICD-10-CM | POA: Diagnosis not present

## 2022-12-29 DIAGNOSIS — D509 Iron deficiency anemia, unspecified: Secondary | ICD-10-CM

## 2022-12-29 DIAGNOSIS — N1831 Chronic kidney disease, stage 3a: Secondary | ICD-10-CM | POA: Diagnosis not present

## 2022-12-29 DIAGNOSIS — G629 Polyneuropathy, unspecified: Secondary | ICD-10-CM

## 2022-12-29 DIAGNOSIS — C530 Malignant neoplasm of endocervix: Secondary | ICD-10-CM

## 2022-12-29 DIAGNOSIS — E119 Type 2 diabetes mellitus without complications: Secondary | ICD-10-CM

## 2022-12-29 DIAGNOSIS — I129 Hypertensive chronic kidney disease with stage 1 through stage 4 chronic kidney disease, or unspecified chronic kidney disease: Secondary | ICD-10-CM | POA: Diagnosis not present

## 2022-12-29 DIAGNOSIS — E1122 Type 2 diabetes mellitus with diabetic chronic kidney disease: Secondary | ICD-10-CM | POA: Diagnosis not present

## 2022-12-29 DIAGNOSIS — I82501 Chronic embolism and thrombosis of unspecified deep veins of right lower extremity: Secondary | ICD-10-CM

## 2022-12-29 DIAGNOSIS — N12 Tubulo-interstitial nephritis, not specified as acute or chronic: Secondary | ICD-10-CM | POA: Diagnosis not present

## 2022-12-29 DIAGNOSIS — N390 Urinary tract infection, site not specified: Secondary | ICD-10-CM | POA: Diagnosis not present

## 2022-12-29 DIAGNOSIS — T83512D Infection and inflammatory reaction due to nephrostomy catheter, subsequent encounter: Secondary | ICD-10-CM | POA: Diagnosis not present

## 2022-12-29 DIAGNOSIS — D638 Anemia in other chronic diseases classified elsewhere: Secondary | ICD-10-CM | POA: Diagnosis not present

## 2022-12-29 LAB — CBC WITH DIFFERENTIAL (CANCER CENTER ONLY)
Abs Immature Granulocytes: 0.05 10*3/uL (ref 0.00–0.07)
Basophils Absolute: 0.1 10*3/uL (ref 0.0–0.1)
Basophils Relative: 1 %
Eosinophils Absolute: 0.2 10*3/uL (ref 0.0–0.5)
Eosinophils Relative: 3 %
HCT: 26.8 % — ABNORMAL LOW (ref 36.0–46.0)
Hemoglobin: 8.1 g/dL — ABNORMAL LOW (ref 12.0–15.0)
Immature Granulocytes: 1 %
Lymphocytes Relative: 18 %
Lymphs Abs: 1.1 10*3/uL (ref 0.7–4.0)
MCH: 29 pg (ref 26.0–34.0)
MCHC: 30.2 g/dL (ref 30.0–36.0)
MCV: 96.1 fL (ref 80.0–100.0)
Monocytes Absolute: 0.4 10*3/uL (ref 0.1–1.0)
Monocytes Relative: 7 %
Neutro Abs: 4.4 10*3/uL (ref 1.7–7.7)
Neutrophils Relative %: 70 %
Platelet Count: 206 10*3/uL (ref 150–400)
RBC: 2.79 MIL/uL — ABNORMAL LOW (ref 3.87–5.11)
RDW: 17.5 % — ABNORMAL HIGH (ref 11.5–15.5)
WBC Count: 6.1 10*3/uL (ref 4.0–10.5)
nRBC: 0 % (ref 0.0–0.2)

## 2022-12-29 LAB — SAMPLE TO BLOOD BANK

## 2022-12-29 LAB — PROTIME-INR
INR: 2.4 — ABNORMAL HIGH (ref 0.8–1.2)
Prothrombin Time: 26.2 seconds — ABNORMAL HIGH (ref 11.4–15.2)

## 2022-12-29 NOTE — Telephone Encounter (Signed)
It is a grade C-- monitor therapy-- the venlafaxine might increase the effect of the warfarin-- she will need to get an INR about a week after she starts the medication-- it is still ok for her to start the medication-- they will just need to monitor the INR a little more frequently for a few weeks

## 2022-12-29 NOTE — Telephone Encounter (Signed)
Spoke with Tanner and informed him of the message below.  Tanner agreed to contact the patient and stated she may have an appt with the cardiologist coming up soon.

## 2022-12-29 NOTE — Progress Notes (Signed)
Hematology and Oncology Follow Up Visit  Jaime Gomez [id number] Jul 10, 1966 57 y.o. 12/29/2022   Principle Diagnosis:  Metastatic adenocarcinoma of the endometrium -- No Actionable mutations Chronic renal failure Recurrent thromboembolic disease   Current Therapy:        Afinitor/letrozole - DC'd Blood transfusion as needed Coumadin PO daily adjusted to maintain INR 2.5-3.5   Interim History:  Jaime Gomez is her today with her sister for follow-up. She is feeling so much better today. Her energy is improved. Her neuropathy pain in the hands and lower extremities seems to be better.  The swelling in her lower extremities is also much improved.  She is ambulating with a cane for added support. No falls or syncope reported.  She still notes low grad fevers of up to 100.1 at times in the night. She has chills with this and also notes whole body aches.  She has noted a little SOB at times and has an appointment with cardiology next week on Monday.  No n/v, cough, rash, dizziness, chest pain, palpitations, abdominal pain or changes in bowel or bladder habits.  Her PCP has started her on Ditropan-XL and Effexor XR.  She is scheduled for cystoscopy, right retrograde pyelogram, Botox injection into bladder and right ureteral stent exchange with [facility] Dr. Cardell Peach on 01/12/2023.   Appetite and hydration overall are good. Weight is 147 lbs.   ECOG Performance Status: 1 - Symptomatic but completely ambulatory  Medications:  Allergies as of 12/29/2022       Reactions   Taxotere [docetaxel] Swelling   Throat swelling        Medication List        Accurate as of December 29, 2022  2:20 PM. If you have any questions, ask your nurse or doctor.          acetaminophen 500 MG tablet Commonly known as: TYLENOL Take 500 mg by mouth every 6 (six) hours as needed for mild pain.   carvedilol 12.5 MG tablet Commonly known as: Coreg Take 1 tablet (12.5 mg total) by mouth 2 (two) times daily  with a meal.   FreeStyle Libre 3 Sensor Misc 1 Act by Does not apply route daily. Place 1 sensor on the skin every 14 days. Use to check glucose continuously   furosemide 20 MG tablet Commonly known as: LASIX Take 1 tablet (20 mg total) by mouth daily.   gabapentin 300 MG capsule Commonly known as: NEURONTIN Take 1 capsule (300 mg total) by mouth 3 (three) times daily.   insulin detemir 100 UNIT/ML injection Commonly known as: LEVEMIR Inject 0.2 mLs (20 Units total) into the skin 2 (two) times daily.   mirabegron ER 25 MG Tb24 tablet Commonly known as: Myrbetriq Take 1 tablet (25 mg total) by mouth daily for 360 doses.   NovoLOG FlexPen 100 UNIT/ML FlexPen Generic drug: insulin aspart Inject 2 Units into the skin 3 times daily with meals.   oxybutynin 10 MG 24 hr tablet Commonly known as: DITROPAN-XL Take 10 mg by mouth daily.   oxycodone 5 MG capsule Commonly known as: OXY-IR Take 1 capsule (5 mg total) by mouth every 4 (four) hours as needed for pain.   polyethylene glycol 17 g packet Commonly known as: MIRALAX / GLYCOLAX Take 17 g by mouth daily as needed for mild constipation.   prochlorperazine 10 MG tablet Commonly known as: COMPAZINE Take 10 mg by mouth every 6 (six) hours as needed for nausea or vomiting.   venlafaxine XR  37.5 MG 24 hr capsule Commonly known as: Effexor XR Take 1 capsule (37.5 mg total) by mouth daily with breakfast.   warfarin 5 MG tablet Commonly known as: COUMADIN Take 1 tablet (5 mg) by mouth daily.        Allergies:  Allergies  Allergen Reactions   Taxotere [Docetaxel] Swelling    Throat swelling    Past Medical History, Surgical history, Social history, and Family History were reviewed and updated.  Review of Systems: All other 10 point review of systems is negative.   Physical Exam:  oral temperature is 97.5 F (36.4 C) (abnormal). Her blood pressure is 142/69 (abnormal) and her pulse is 61. Her respiration is 20 and  oxygen saturation is 100%.   Wt Readings from Last 3 Encounters:  12/26/22 147 lb 3.2 oz (66.8 kg)  12/08/22 139 lb 12.8 oz (63.4 kg)  12/03/22 134 lb (60.8 kg)    Ocular: Sclerae unicteric, pupils equal, round and reactive to light Ear-nose-throat: Oropharynx clear, dentition fair Lymphatic: No cervical or supraclavicular adenopathy Lungs no rales or rhonchi, good excursion bilaterally Heart regular rate and rhythm, no murmur appreciated Abd soft, nontender, positive bowel sounds MSK no focal spinal tenderness, no joint edema Neuro: non-focal, well-oriented, appropriate affect Breasts: Deferred   Lab Results  Component Value Date   WBC 6.1 12/29/2022   HGB 8.1 (L) 12/29/2022   HCT 26.8 (L) 12/29/2022   MCV 96.1 12/29/2022   PLT 206 12/29/2022   Lab Results  Component Value Date   FERRITIN 1,812 (H) 11/16/2022   IRON 28 11/16/2022   TIBC 211 (L) 11/16/2022   UIBC 183 11/16/2022   IRONPCTSAT 13 11/16/2022   Lab Results  Component Value Date   RETICCTPCT 0.7 10/11/2022   RBC 2.79 (L) 12/29/2022   No results found for: "KPAFRELGTCHN", "LAMBDASER", "KAPLAMBRATIO" No results found for: "IGGSERUM", "IGA", "IGMSERUM" No results found for: "TOTALPROTELP", "ALBUMINELP", "A1GS", "A2GS", "BETS", "BETA2SER", "GAMS", "MSPIKE", "SPEI"   Chemistry      Component Value Date/Time   NA 140 12/15/2022 1419   K 5.3 (H) 12/15/2022 1419   CL 107 12/15/2022 1419   CO2 23 12/15/2022 1419   BUN 50 (H) 12/15/2022 1419   CREATININE 3.53 (H) 12/15/2022 1419      Component Value Date/Time   CALCIUM 9.0 12/15/2022 1419   ALKPHOS 83 12/15/2022 1419   AST 11 (L) 12/15/2022 1419   ALT <5 12/15/2022 1419   BILITOT 0.5 12/15/2022 1419       Impression and Plan: Jaime Gomez is a 58 yo caucasian female with metastatic endometrial cancer.  INR stable at 2.4, she will continue her same regimen with Coumadin.  For her procedure with urology we will have her stop her Couamdin on 01/04/2023 and  star Arixtra 7.5 mg SQ daily until 2 days prior to surgery.  She needs to have a port a cath placed 2 days prior to surgery.  She will restart both the Arixtra and Coumadin the day after surgery to bridge until INR is 2 and she can stop the Arixtra.  Dr. Myna Hidalgo discussed this with the patient as well as eventually starting her on Adriamycin after she has recuperated from surgery. She verbalized understanding and agreement with the plan.  Follow-up 1 weeks after surgery around 01/19/2023.   Eileen Stanford, NP 6/7/20242:20 PM

## 2022-12-29 NOTE — Telephone Encounter (Signed)
Tanner, Physical therapist at [facility] called and requested a call back from careteam. He states pt is currently taking Warfarin and venlafaxine. Pt had an interaction and he needs direction on whether pt should continue to take medication. Call back number: [phone].

## 2022-12-29 NOTE — Progress Notes (Signed)
Spoke with dr t brock mda on 12-28-2022 and reviewed pt history, pt meets [facility] criteria for 01-12-2023 surgery per dr t brock mda.

## 2023-01-01 ENCOUNTER — Encounter: Payer: Self-pay | Admitting: Student

## 2023-01-01 ENCOUNTER — Ambulatory Visit: Payer: 59 | Attending: Student | Admitting: Student

## 2023-01-01 VITALS — BP 128/70 | HR 59 | Ht 64.0 in | Wt 141.2 lb

## 2023-01-01 DIAGNOSIS — I82501 Chronic embolism and thrombosis of unspecified deep veins of right lower extremity: Secondary | ICD-10-CM | POA: Diagnosis not present

## 2023-01-01 DIAGNOSIS — N184 Chronic kidney disease, stage 4 (severe): Secondary | ICD-10-CM | POA: Diagnosis not present

## 2023-01-01 DIAGNOSIS — I825Y1 Chronic embolism and thrombosis of unspecified deep veins of right proximal lower extremity: Secondary | ICD-10-CM

## 2023-01-01 DIAGNOSIS — T83512D Infection and inflammatory reaction due to nephrostomy catheter, subsequent encounter: Secondary | ICD-10-CM | POA: Diagnosis not present

## 2023-01-01 DIAGNOSIS — N1831 Chronic kidney disease, stage 3a: Secondary | ICD-10-CM | POA: Diagnosis not present

## 2023-01-01 DIAGNOSIS — C541 Malignant neoplasm of endometrium: Secondary | ICD-10-CM | POA: Diagnosis not present

## 2023-01-01 DIAGNOSIS — I10 Essential (primary) hypertension: Secondary | ICD-10-CM | POA: Diagnosis not present

## 2023-01-01 DIAGNOSIS — E1122 Type 2 diabetes mellitus with diabetic chronic kidney disease: Secondary | ICD-10-CM | POA: Diagnosis not present

## 2023-01-01 DIAGNOSIS — N12 Tubulo-interstitial nephritis, not specified as acute or chronic: Secondary | ICD-10-CM | POA: Diagnosis not present

## 2023-01-01 DIAGNOSIS — N390 Urinary tract infection, site not specified: Secondary | ICD-10-CM | POA: Diagnosis not present

## 2023-01-01 DIAGNOSIS — I5181 Takotsubo syndrome: Secondary | ICD-10-CM

## 2023-01-01 DIAGNOSIS — A4150 Gram-negative sepsis, unspecified: Secondary | ICD-10-CM | POA: Diagnosis not present

## 2023-01-01 DIAGNOSIS — D638 Anemia in other chronic diseases classified elsewhere: Secondary | ICD-10-CM | POA: Diagnosis not present

## 2023-01-01 DIAGNOSIS — I129 Hypertensive chronic kidney disease with stage 1 through stage 4 chronic kidney disease, or unspecified chronic kidney disease: Secondary | ICD-10-CM | POA: Diagnosis not present

## 2023-01-01 NOTE — Patient Instructions (Signed)
Medication Instructions:   CONTINUE to take Lasix 20 mg as needed for swelling   *If you need a refill on your cardiac medications before your next appointment, please call your pharmacy*  Lab Work: Your physician recommends that you return for lab work TODAY:  BMP  If you have labs (blood work) drawn today and your tests are completely normal, you will receive your results only by: MyChart Message (if you have MyChart) OR A paper copy in the mail If you have any lab test that is abnormal or we need to change your treatment, we will call you to review the results.  Testing/Procedures: NONE ordered at this time of appointment   Follow-Up: At [facility], you and your health needs are our priority.  As part of our continuing mission to provide you with exceptional heart care, we have created designated Provider Care Teams.  These Care Teams include your primary Cardiologist (physician) and Advanced Practice Providers (APPs -  Physician Assistants and Nurse Practitioners) who all work together to provide you with the care you need, when you need it.  Your next appointment:   3 month(s)  Provider:   Thurmon Fair, MD  or Marjie Skiff, PA-C        Other Instructions Heart Failure Education: Weigh yourself EVERY morning after you go to the bathroom but before you eat or drink anything. Write this number down in a weight log/diary. If you gain 3 pounds overnight or 5 pounds in a week, call the office. Take your medicines as prescribed. If you have concerns about your medications, please call us before you stop taking them.  Eat low salt foods--Limit salt (sodium) to 2000 mg per day. This will help prevent your body from holding onto fluid. Read food labels as many processed foods have a lot of sodium, especially canned goods and prepackaged meats. If you would like some assistance choosing low sodium foods, we would be happy to set you up with a nutritionist. Stay as active as  you can everyday. Staying active will give you more energy and make your muscles stronger. Start with 5 minutes at a time and work your way up to 30 minutes a day. Break up your activities--do some in the morning and some in the afternoon. Start with 3 days per week and work your way up to 5 days as you can.  If you have chest pain, feel short of breath, dizzy, or lightheaded, STOP. If you don't feel better after a short rest, call 911. If you do feel better, call the office to let us know you have symptoms with exercise. Limit all fluids for the day to less than 2 liters. Fluid includes all drinks, coffee, juice, ice chips, soup, jello, and all other liquids.

## 2023-01-02 ENCOUNTER — Emergency Department (HOSPITAL_COMMUNITY): Payer: 59

## 2023-01-02 ENCOUNTER — Telehealth: Payer: Self-pay | Admitting: Emergency Medicine

## 2023-01-02 ENCOUNTER — Other Ambulatory Visit: Payer: Self-pay

## 2023-01-02 ENCOUNTER — Encounter (HOSPITAL_COMMUNITY): Payer: Self-pay

## 2023-01-02 ENCOUNTER — Emergency Department (HOSPITAL_COMMUNITY)
Admission: EM | Admit: 2023-01-02 | Discharge: 2023-01-02 | Disposition: A | Discharge status: Home / Self Care | Payer: 59 | Attending: Emergency Medicine | Admitting: Emergency Medicine

## 2023-01-02 DIAGNOSIS — N189 Chronic kidney disease, unspecified: Secondary | ICD-10-CM

## 2023-01-02 DIAGNOSIS — D649 Anemia, unspecified: Secondary | ICD-10-CM | POA: Insufficient documentation

## 2023-01-02 DIAGNOSIS — N135 Crossing vessel and stricture of ureter without hydronephrosis: Secondary | ICD-10-CM | POA: Diagnosis not present

## 2023-01-02 DIAGNOSIS — I129 Hypertensive chronic kidney disease with stage 1 through stage 4 chronic kidney disease, or unspecified chronic kidney disease: Secondary | ICD-10-CM | POA: Diagnosis not present

## 2023-01-02 DIAGNOSIS — Z79899 Other long term (current) drug therapy: Secondary | ICD-10-CM | POA: Diagnosis not present

## 2023-01-02 DIAGNOSIS — C541 Malignant neoplasm of endometrium: Secondary | ICD-10-CM

## 2023-01-02 DIAGNOSIS — N179 Acute kidney failure, unspecified: Secondary | ICD-10-CM | POA: Diagnosis not present

## 2023-01-02 DIAGNOSIS — Z794 Long term (current) use of insulin: Secondary | ICD-10-CM | POA: Diagnosis not present

## 2023-01-02 DIAGNOSIS — R7989 Other specified abnormal findings of blood chemistry: Secondary | ICD-10-CM | POA: Diagnosis not present

## 2023-01-02 LAB — URINALYSIS, ROUTINE W REFLEX MICROSCOPIC
Bilirubin Urine: NEGATIVE
Glucose, UA: 50 mg/dL — AB
Ketones, ur: NEGATIVE mg/dL
Nitrite: NEGATIVE
Protein, ur: 100 mg/dL — AB
Specific Gravity, Urine: 1.01 (ref 1.005–1.030)
WBC, UA: >50 WBC/hpf (ref 0–5)
pH: 6 (ref 5.0–8.0)

## 2023-01-02 LAB — COMPREHENSIVE METABOLIC PANEL
ALT: 9 U/L (ref 0–44)
AST: 10 U/L — ABNORMAL LOW (ref 15–41)
Albumin: 3.1 g/dL — ABNORMAL LOW (ref 3.5–5.0)
Alkaline Phosphatase: 91 U/L (ref 38–126)
Anion gap: 11 (ref 5–15)
BUN: 72 mg/dL — ABNORMAL HIGH (ref 6–20)
CO2: 19 mmol/L — ABNORMAL LOW (ref 22–32)
Calcium: 8.9 mg/dL (ref 8.9–10.3)
Chloride: 105 mmol/L (ref 98–111)
Creatinine, Ser: 3.32 mg/dL — ABNORMAL HIGH (ref 0.44–1.00)
GFR, Estimated: 15 mL/min — ABNORMAL LOW (ref >60)
Glucose, Bld: 115 mg/dL — ABNORMAL HIGH (ref 70–99)
Potassium: 4.5 mmol/L (ref 3.5–5.1)
Sodium: 135 mmol/L (ref 135–145)
Total Bilirubin: 0.4 mg/dL (ref 0.3–1.2)
Total Protein: 7.4 g/dL (ref 6.5–8.1)

## 2023-01-02 LAB — BASIC METABOLIC PANEL
BUN/Creatinine Ratio: 18 (ref 9–23)
BUN: 57 mg/dL — ABNORMAL HIGH (ref 6–24)
CO2: 17 mmol/L — ABNORMAL LOW (ref 20–29)
Calcium: 9.3 mg/dL (ref 8.7–10.2)
Chloride: 107 mmol/L — ABNORMAL HIGH (ref 96–106)
Creatinine, Ser: 3.14 mg/dL — ABNORMAL HIGH (ref 0.57–1.00)
Glucose: 165 mg/dL — ABNORMAL HIGH (ref 70–99)
Potassium: 6 mmol/L (ref 3.5–5.2)
Sodium: 143 mmol/L (ref 134–144)
eGFR: 17 mL/min/{1.73_m2} — ABNORMAL LOW (ref >59)

## 2023-01-02 LAB — SAMPLE TO BLOOD BANK

## 2023-01-02 LAB — PROTIME-INR
INR: 2.7 — ABNORMAL HIGH (ref 0.8–1.2)
Prothrombin Time: 28.7 seconds — ABNORMAL HIGH (ref 11.4–15.2)

## 2023-01-02 LAB — CBC WITH DIFFERENTIAL/PLATELET
Abs Immature Granulocytes: 0.03 10*3/uL (ref 0.00–0.07)
Basophils Absolute: 0 10*3/uL (ref 0.0–0.1)
Basophils Relative: 1 %
Eosinophils Absolute: 0.2 10*3/uL (ref 0.0–0.5)
Eosinophils Relative: 4 %
HCT: 28.1 % — ABNORMAL LOW (ref 36.0–46.0)
Hemoglobin: 8.7 g/dL — ABNORMAL LOW (ref 12.0–15.0)
Immature Granulocytes: 1 %
Lymphocytes Relative: 16 %
Lymphs Abs: 0.8 10*3/uL (ref 0.7–4.0)
MCH: 29.8 pg (ref 26.0–34.0)
MCHC: 31 g/dL (ref 30.0–36.0)
MCV: 96.2 fL (ref 80.0–100.0)
Monocytes Absolute: 0.3 10*3/uL (ref 0.1–1.0)
Monocytes Relative: 6 %
Neutro Abs: 3.5 10*3/uL (ref 1.7–7.7)
Neutrophils Relative %: 72 %
Platelets: 211 10*3/uL (ref 150–400)
RBC: 2.92 MIL/uL — ABNORMAL LOW (ref 3.87–5.11)
RDW: 17.7 % — ABNORMAL HIGH (ref 11.5–15.5)
WBC: 4.8 10*3/uL (ref 4.0–10.5)
nRBC: 0 % (ref 0.0–0.2)

## 2023-01-02 LAB — APTT: aPTT: 61 seconds — ABNORMAL HIGH (ref 24–36)

## 2023-01-02 MED ORDER — SODIUM CHLORIDE 0.9 % IV BOLUS
500.0000 mL | Freq: Once | INTRAVENOUS | Status: AC
  Administered 2023-01-02: 500 mL via INTRAVENOUS

## 2023-01-02 MED ORDER — SODIUM ZIRCONIUM CYCLOSILICATE 10 G PO PACK
10.0000 g | PACK | ORAL | Status: AC
  Administered 2023-01-02: 10 g via ORAL
  Filled 2023-01-02: qty 1

## 2023-01-02 NOTE — ED Notes (Signed)
Pt verbalized understanding of discharge instructions. Opportunity for questions provided.  

## 2023-01-02 NOTE — ED Triage Notes (Signed)
Patient is here for evaluation after receiving abnormal lab values from her heart doctor. Reports her potassium level was elevated and her kidney function was worse than before. Has CKD, but not on dialysis.

## 2023-01-02 NOTE — ED Notes (Signed)
Pt is aware we need a urine sample 

## 2023-01-02 NOTE — ED Notes (Signed)
Pt reports she was told by the Specialist that there is not reason for her to be kept.  EDP ask to give Pt an update.

## 2023-01-02 NOTE — Discharge Instructions (Signed)
You were seen for your elevated potassium in the emergency department.  It is likely that this was a lab error.  Follow-up with your primary doctor in 2-3 days regarding your visit.  Follow-up with urology as scheduled.  Return immediately to the emergency department if you experience any of the following: Difficulty breathing, flank pain, fevers, or any other concerning symptoms.    Thank you for visiting our Emergency Department. It was a pleasure taking care of you today.

## 2023-01-02 NOTE — Telephone Encounter (Signed)
Received critical results from [facility], K= 6.0  This has already been seen and addressed by the NP this morning. See note below:   Jaime Parker, PA-C 01/02/2023  8:25 AM EDT Back to Top    Potassium elevated at 6.0 and CO2 down to 17. Called and spoke with patient. She states she feels more unsteady on her feet this morning and more fatigued and dizzy. I did consider trying to address as an outpatient with [facility] today and repeat STAT BMET tomorrow morning. However, given underlying renal function and endometrial cancer with recent complex admission, I feel like the better and safer option is for her to go to the ED. Patient voiced understanding and will go to [facility] ED.

## 2023-01-02 NOTE — ED Provider Notes (Signed)
[place] EMERGENCY DEPARTMENT AT [facility] Provider Note   CSN: [id number] Arrival date & time: 01/02/23  1025     History {Add pertinent medical, surgical, social history, OB history to HPI:1} Chief Complaint  Patient presents with   Abnormal labs    Jaime Gomez is a 57 y.o. female.  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  Patient reports that she currently is asymptomatic.  Has been having good urine output from her nephrostomy and bladder when urinating.  No shortness of breath or lower extremity swelling.  Denies any fevers.  Had routine labs drawn yesterday that showed a potassium of 6.0 and stable creatinine.  Patient reports that she has been eating and drinking normally recently.  Says that they are planning on changing out the stents in her right kidney in the near future.  Has had a hysterectomy and denies any other surgeries aside from a hernia repair and those mentioned above.       Home Medications Prior to Admission medications   Medication Sig Start Date End Date Taking? Authorizing Provider  acetaminophen (TYLENOL) 500 MG tablet Take 500 mg by mouth every 6 (six) hours as needed for mild pain.    [provider]  carvedilol (COREG) 12.5 MG tablet Take 1 tablet (12.5 mg total) by mouth 2 (two) times daily with a meal. 08/30/22   Ennever, Rose Phi, MD  Continuous Glucose Sensor (FREESTYLE LIBRE 3 SENSOR) MISC 1 Act by Does not apply route daily. Place 1 sensor on the skin every 14 days. Use to check glucose continuously 12/26/22   Karie Georges, MD  furosemide (LASIX) 20 MG tablet Take 20 mg by mouth as needed for fluid or edema.    [provider]  gabapentin (NEURONTIN) 300 MG  capsule Take 1 capsule (300 mg total) by mouth 3 (three) times daily. 12/15/22   Erenest Blank, NP  insulin aspart (NOVOLOG) 100 UNIT/ML FlexPen Inject 2 Units into the skin 3 times daily with meals. 12/12/22   Marinda Elk, MD  insulin detemir (LEVEMIR) 100 UNIT/ML injection Inject 0.2 mLs (20 Units total) into the skin 2 (two) times daily. 08/30/22   [name], MD  mirabegron ER (MYRBETRIQ) 25 MG TB24 tablet Take 1 tablet (25 mg total) by mouth daily for 360 doses. 09/02/21 11/29/22  Jannifer Hick, MD  oxybutynin (DITROPAN-XL) 10 MG 24 hr tablet Take 10 mg by mouth daily. 08/27/22   [provider]  oxycodone (OXY-IR) 5 MG capsule Take 1 capsule (5 mg total) by mouth every 4 (four) hours as needed for pain. 12/13/22   [name], MD  polyethylene glycol (MIRALAX / GLYCOLAX) 17 g packet Take 17 g by mouth daily as needed for mild constipation. 09/05/21   Pokhrel, Rebekah Chesterfield, MD  prochlorperazine (COMPAZINE) 10 MG tablet Take 10 mg by mouth every 6 (six) hours as needed for nausea or vomiting. 08/18/21   [provider]  venlafaxine XR (EFFEXOR XR) 37.5 MG 24 hr capsule Take 1 capsule (37.5 mg total) by mouth daily with breakfast. 12/26/22   Karie Georges, MD  warfarin (COUMADIN) 5 MG tablet Take 1 tablet (5 mg) by mouth daily. 12/12/22   Marinda Elk, MD      Allergies  Taxotere [docetaxel]    Review of Systems   Review of Systems  Physical Exam Updated Vital Signs BP 124/78 (BP Location: Left Arm)   Pulse 70   Temp 98.2 F (36.8 C) (Oral)   Resp 18   Ht 5\' 4"  (1.626 m)   Wt 63.5 kg   SpO2 100%   BMI 24.03 kg/m  Physical Exam Vitals and nursing note reviewed.  Constitutional:      General: She is not in acute distress.    Appearance: She is well-developed.  HENT:     Head: Normocephalic and atraumatic.     Right Ear: External ear normal.     Left Ear: External ear normal.     Nose: Nose normal.  Eyes:     Extraocular Movements:  Extraocular movements intact.     Conjunctiva/sclera: Conjunctivae normal.     Pupils: Pupils are equal, round, and reactive to light.  Cardiovascular:     Rate and Rhythm: Normal rate and regular rhythm.     Heart sounds: No murmur heard. Pulmonary:     Effort: Pulmonary effort is normal. No respiratory distress.     Breath sounds: Normal breath sounds.  Abdominal:     General: Abdomen is flat. There is no distension.     Palpations: Abdomen is soft. There is no mass.     Tenderness: There is no abdominal tenderness. There is no right CVA tenderness, left CVA tenderness or guarding.     Comments: Nephrostomy tube in left lower back is draining straw-colored urine.  Musculoskeletal:     Cervical back: Normal range of motion and neck supple.  Skin:    General: Skin is warm and dry.  Neurological:     Mental Status: She is alert and oriented to person, place, and time. Mental status is at baseline.  Psychiatric:        Mood and Affect: Mood normal.     ED Results / Procedures / Treatments   Labs (all labs ordered are listed, but only abnormal results are displayed) Labs Reviewed  COMPREHENSIVE METABOLIC PANEL  CBC WITH DIFFERENTIAL/PLATELET  URINALYSIS, ROUTINE W REFLEX MICROSCOPIC  PROTIME-INR  APTT  SAMPLE TO BLOOD BANK    EKG EKG Interpretation  Date/Time:  Tuesday January 02 2023 10:46:00 EDT Ventricular Rate:  64 PR Interval:  159 QRS Duration: 94 QT Interval:  444 QTC Calculation: 459 R Axis:   -14 Text Interpretation: Sinus rhythm Probable anterior infarct, age indeterminate Lateral leads are also involved When compared to prior sinus tachycardia no longer present Confirmed by Vonita Moss [id number]) on 01/02/2023 10:53:49 AM  Radiology No results found.  Procedures Procedures  {Document cardiac monitor, telemetry assessment procedure when appropriate:1}  Medications Ordered in ED Medications  sodium chloride 0.9 % bolus 500 mL (has no administration in  time range)  sodium zirconium cyclosilicate (LOKELMA) packet 10 g (has no administration in time range)    ED Course/ Medical Decision Making/ A&P   {   Click here for ABCD2, HEART and other calculatorsREFRESH Note before signing :1}                          Medical Decision Making Amount and/or Complexity of Data Reviewed Labs: ordered. Radiology: ordered.  Risk Prescription drug management.   ***  {Document critical care time when appropriate:1} {Document review of labs and clinical decision tools ie heart score, Chads2Vasc2 etc:1}  {Document your independent review of radiology images, and  any outside records:1} {Document your discussion with family members, caretakers, and with consultants:1} {Document social determinants of health affecting pt's care:1} {Document your decision making why or why not admission, treatments were needed:1} Final Clinical Impression(s) / ED Diagnoses Final diagnoses:  None    Rx / DC Orders ED Discharge Orders     None

## 2023-01-03 DIAGNOSIS — I82501 Chronic embolism and thrombosis of unspecified deep veins of right lower extremity: Secondary | ICD-10-CM | POA: Diagnosis not present

## 2023-01-03 DIAGNOSIS — N1831 Chronic kidney disease, stage 3a: Secondary | ICD-10-CM | POA: Diagnosis not present

## 2023-01-03 DIAGNOSIS — A4150 Gram-negative sepsis, unspecified: Secondary | ICD-10-CM | POA: Diagnosis not present

## 2023-01-03 DIAGNOSIS — I129 Hypertensive chronic kidney disease with stage 1 through stage 4 chronic kidney disease, or unspecified chronic kidney disease: Secondary | ICD-10-CM | POA: Diagnosis not present

## 2023-01-03 DIAGNOSIS — N12 Tubulo-interstitial nephritis, not specified as acute or chronic: Secondary | ICD-10-CM | POA: Diagnosis not present

## 2023-01-03 DIAGNOSIS — T83512D Infection and inflammatory reaction due to nephrostomy catheter, subsequent encounter: Secondary | ICD-10-CM | POA: Diagnosis not present

## 2023-01-03 DIAGNOSIS — D638 Anemia in other chronic diseases classified elsewhere: Secondary | ICD-10-CM | POA: Diagnosis not present

## 2023-01-03 DIAGNOSIS — N390 Urinary tract infection, site not specified: Secondary | ICD-10-CM | POA: Diagnosis not present

## 2023-01-03 DIAGNOSIS — E1122 Type 2 diabetes mellitus with diabetic chronic kidney disease: Secondary | ICD-10-CM | POA: Diagnosis not present

## 2023-01-03 DIAGNOSIS — C541 Malignant neoplasm of endometrium: Secondary | ICD-10-CM | POA: Diagnosis not present

## 2023-01-04 ENCOUNTER — Other Ambulatory Visit (HOSPITAL_COMMUNITY): Payer: 59

## 2023-01-04 ENCOUNTER — Encounter (HOSPITAL_COMMUNITY): Payer: 59

## 2023-01-04 DIAGNOSIS — T83512D Infection and inflammatory reaction due to nephrostomy catheter, subsequent encounter: Secondary | ICD-10-CM | POA: Diagnosis not present

## 2023-01-04 DIAGNOSIS — I82501 Chronic embolism and thrombosis of unspecified deep veins of right lower extremity: Secondary | ICD-10-CM | POA: Diagnosis not present

## 2023-01-04 DIAGNOSIS — N12 Tubulo-interstitial nephritis, not specified as acute or chronic: Secondary | ICD-10-CM | POA: Diagnosis not present

## 2023-01-04 DIAGNOSIS — I129 Hypertensive chronic kidney disease with stage 1 through stage 4 chronic kidney disease, or unspecified chronic kidney disease: Secondary | ICD-10-CM | POA: Diagnosis not present

## 2023-01-04 DIAGNOSIS — D638 Anemia in other chronic diseases classified elsewhere: Secondary | ICD-10-CM | POA: Diagnosis not present

## 2023-01-04 DIAGNOSIS — E1122 Type 2 diabetes mellitus with diabetic chronic kidney disease: Secondary | ICD-10-CM | POA: Diagnosis not present

## 2023-01-04 DIAGNOSIS — N390 Urinary tract infection, site not specified: Secondary | ICD-10-CM | POA: Diagnosis not present

## 2023-01-04 DIAGNOSIS — A4150 Gram-negative sepsis, unspecified: Secondary | ICD-10-CM | POA: Diagnosis not present

## 2023-01-04 DIAGNOSIS — N1831 Chronic kidney disease, stage 3a: Secondary | ICD-10-CM | POA: Diagnosis not present

## 2023-01-04 DIAGNOSIS — C541 Malignant neoplasm of endometrium: Secondary | ICD-10-CM | POA: Diagnosis not present

## 2023-01-05 ENCOUNTER — Telehealth: Payer: Self-pay | Admitting: *Deleted

## 2023-01-05 DIAGNOSIS — N12 Tubulo-interstitial nephritis, not specified as acute or chronic: Secondary | ICD-10-CM | POA: Diagnosis not present

## 2023-01-05 DIAGNOSIS — D638 Anemia in other chronic diseases classified elsewhere: Secondary | ICD-10-CM | POA: Diagnosis not present

## 2023-01-05 DIAGNOSIS — N1831 Chronic kidney disease, stage 3a: Secondary | ICD-10-CM | POA: Diagnosis not present

## 2023-01-05 DIAGNOSIS — T83512D Infection and inflammatory reaction due to nephrostomy catheter, subsequent encounter: Secondary | ICD-10-CM | POA: Diagnosis not present

## 2023-01-05 DIAGNOSIS — E1122 Type 2 diabetes mellitus with diabetic chronic kidney disease: Secondary | ICD-10-CM | POA: Diagnosis not present

## 2023-01-05 DIAGNOSIS — I129 Hypertensive chronic kidney disease with stage 1 through stage 4 chronic kidney disease, or unspecified chronic kidney disease: Secondary | ICD-10-CM | POA: Diagnosis not present

## 2023-01-05 DIAGNOSIS — A4150 Gram-negative sepsis, unspecified: Secondary | ICD-10-CM | POA: Diagnosis not present

## 2023-01-05 DIAGNOSIS — I82501 Chronic embolism and thrombosis of unspecified deep veins of right lower extremity: Secondary | ICD-10-CM | POA: Diagnosis not present

## 2023-01-05 DIAGNOSIS — C541 Malignant neoplasm of endometrium: Secondary | ICD-10-CM | POA: Diagnosis not present

## 2023-01-05 DIAGNOSIS — N390 Urinary tract infection, site not specified: Secondary | ICD-10-CM | POA: Diagnosis not present

## 2023-01-05 NOTE — Telephone Encounter (Signed)
Call received from [facility] with [facility] stating that pt would like Home Health to draw her PT/INR and would like an order to do so.  Celene Skeen NP notified. Daniel notified per order of S. Montez Morita NP that it is ok to draw weekly PT/INR and fax results to [phone].  Reuel Boom is appreciative of assistance and has no further questions at this time.

## 2023-01-08 ENCOUNTER — Other Ambulatory Visit: Payer: Self-pay | Admitting: Radiology

## 2023-01-08 DIAGNOSIS — N1831 Chronic kidney disease, stage 3a: Secondary | ICD-10-CM | POA: Diagnosis not present

## 2023-01-08 DIAGNOSIS — E1122 Type 2 diabetes mellitus with diabetic chronic kidney disease: Secondary | ICD-10-CM | POA: Diagnosis not present

## 2023-01-08 DIAGNOSIS — R609 Edema, unspecified: Secondary | ICD-10-CM | POA: Diagnosis not present

## 2023-01-08 DIAGNOSIS — C541 Malignant neoplasm of endometrium: Secondary | ICD-10-CM | POA: Diagnosis not present

## 2023-01-08 DIAGNOSIS — D631 Anemia in chronic kidney disease: Secondary | ICD-10-CM | POA: Diagnosis not present

## 2023-01-08 DIAGNOSIS — N1330 Unspecified hydronephrosis: Secondary | ICD-10-CM | POA: Diagnosis not present

## 2023-01-08 DIAGNOSIS — I129 Hypertensive chronic kidney disease with stage 1 through stage 4 chronic kidney disease, or unspecified chronic kidney disease: Secondary | ICD-10-CM | POA: Diagnosis not present

## 2023-01-08 DIAGNOSIS — N184 Chronic kidney disease, stage 4 (severe): Secondary | ICD-10-CM | POA: Diagnosis not present

## 2023-01-08 DIAGNOSIS — N2581 Secondary hyperparathyroidism of renal origin: Secondary | ICD-10-CM | POA: Diagnosis not present

## 2023-01-08 DIAGNOSIS — I82501 Chronic embolism and thrombosis of unspecified deep veins of right lower extremity: Secondary | ICD-10-CM | POA: Diagnosis not present

## 2023-01-08 DIAGNOSIS — N12 Tubulo-interstitial nephritis, not specified as acute or chronic: Secondary | ICD-10-CM | POA: Diagnosis not present

## 2023-01-08 DIAGNOSIS — A4150 Gram-negative sepsis, unspecified: Secondary | ICD-10-CM | POA: Diagnosis not present

## 2023-01-08 DIAGNOSIS — D638 Anemia in other chronic diseases classified elsewhere: Secondary | ICD-10-CM | POA: Diagnosis not present

## 2023-01-08 DIAGNOSIS — T83512D Infection and inflammatory reaction due to nephrostomy catheter, subsequent encounter: Secondary | ICD-10-CM | POA: Diagnosis not present

## 2023-01-08 DIAGNOSIS — N390 Urinary tract infection, site not specified: Secondary | ICD-10-CM | POA: Diagnosis not present

## 2023-01-08 NOTE — H&P (Signed)
Referring Physician(s): Gay,M  Supervising Physician: Marliss Coots  Patient Status:  [facility]  Chief Complaint: Metastatic endometrial cancer, obstructive hydronephrosis   Subjective: Patient known to IR service from Port-A-Cath injection 2023, left nephrostomy on 09/03/2021, nephrostomy tube exchange on 11/02/2021, removal of left chest Port-A-Cath and placement of right chest Port-A-Cath on 11/14/2021, left nephrostomy tube exchange on 01/20/2022 with nephrostogram at that time showing fistula from distal ureter to vagina, exchange of left nephrostomy on 11/29/2022 and Port-A-Cath removal secondary to bacteremia on 12/04/2022; she is a 57 year old female with history of 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  Latest nephrostogram on 11/29/2022 revealed patency of the left ureter to the level of the urinary bladder there was uncertainty whether contrast passed into the urinary bladder or if there was persistent fistulous connection between the distal end of the bladder and the vagina.  She presents today for final attempt at left-sided ureteral stent placement.    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 failure 08/18/2021   Diabetes mellitus without complication (HCC)    Endometrial cancer, FIGO stage IVB (HCC) 08/18/2021   Goals of care, counseling/discussion 08/18/2021   History of kidney stones    Hypertension    Leg DVT (deep venous thromboembolism), chronic, right (HCC) 08/18/2021   Ovarian cancer (HCC) 2013   Pneumonia    Past Surgical History:  Procedure Laterality Date   ABDOMINAL HYSTERECTOMY     CYSTOSCOPY W/ URETERAL STENT PLACEMENT Bilateral 09/01/2021   Procedure: CYSTOSCOPY WITH RETROGRADE PYELOGRAM/URETERAL STENT PLACEMENT;  Surgeon: Jannifer Hick, MD;  Location: [facility];  Service: Urology;  Laterality: Bilateral;   CYSTOSCOPY WITH STENT PLACEMENT Right 05/19/2022   Procedure: CYSTOSCOPY WITH STENT CHANGE;  Surgeon: Jannifer Hick, MD;  Location: [facility];  Service: Urology;  Laterality: Right;  ONLY NEEDS 30 MIN   HERNIA REPAIR     IR CONVERT LEFT NEPHROSTOMY TO NEPHROURETERAL CATH  11/02/2021   IR CV LINE INJECTION  08/23/2021   IR IMAGING GUIDED PORT INSERTION  11/14/2021   IR NEPHROSTOMY EXCHANGE LEFT  11/02/2021   IR NEPHROSTOMY EXCHANGE LEFT  01/20/2022   IR NEPHROSTOMY EXCHANGE LEFT  11/29/2022   IR NEPHROSTOMY PLACEMENT LEFT  09/03/2021   IR REMOVAL TUN ACCESS W/ PORT W/O FL MOD SED  11/14/2021   IR REMOVAL TUN ACCESS W/ PORT W/O FL MOD SED  12/04/2022      Allergies: Taxotere [docetaxel]  Medications: Prior to Admission medications   Medication Sig Start Date End Date Taking? Authorizing Provider  acetaminophen (TYLENOL) 500 MG tablet Take 500 mg by mouth every 6 (six) hours as needed for mild pain.    [provider]  carvedilol (COREG) 12.5 MG tablet Take 1 tablet (12.5 mg total) by mouth 2 (two) times daily with a meal. 08/30/22   Ennever, Rose Phi, MD  Continuous Glucose Sensor (FREESTYLE LIBRE 3 SENSOR) MISC 1 Act by Does not apply route daily. Place 1 sensor on the skin every 14 days. Use to check glucose continuously 12/26/22   Karie Georges, MD  fondaparinux (ARIXTRA) 7.5 MG/0.6ML SOLN injection Inject 7.5 mg into the skin daily. Until 2 days prior to surgery    [provider]  furosemide (LASIX) 20 MG tablet Take 20 mg by mouth as needed for fluid  or edema.    [provider]  gabapentin (NEURONTIN) 300 MG capsule Take 1 capsule (300 mg total) by mouth 3 (three) times daily. 12/15/22   Erenest Blank, NP  insulin aspart (NOVOLOG) 100 UNIT/ML FlexPen Inject 2 Units into the skin 3 times daily with meals. 12/12/22   Marinda Elk, MD  insulin detemir (LEVEMIR) 100 UNIT/ML injection  Inject 0.2 mLs (20 Units total) into the skin 2 (two) times daily. 08/30/22   [name], MD  mirabegron ER (MYRBETRIQ) 25 MG TB24 tablet Take 1 tablet (25 mg total) by mouth daily for 360 doses. 09/02/21 01/02/23  Jannifer Hick, MD  oxybutynin (DITROPAN-XL) 10 MG 24 hr tablet Take 10 mg by mouth daily. 08/27/22   [provider]  oxycodone (OXY-IR) 5 MG capsule Take 1 capsule (5 mg total) by mouth every 4 (four) hours as needed for pain. 12/13/22   [name], MD  polyethylene glycol (MIRALAX / GLYCOLAX) 17 g packet Take 17 g by mouth daily as needed for mild constipation. 09/05/21   Pokhrel, Rebekah Chesterfield, MD  prochlorperazine (COMPAZINE) 10 MG tablet Take 10 mg by mouth every 6 (six) hours as needed for nausea or vomiting. 08/18/21   [provider]  venlafaxine XR (EFFEXOR XR) 37.5 MG 24 hr capsule Take 1 capsule (37.5 mg total) by mouth daily with breakfast. 12/26/22   Karie Georges, MD  warfarin (COUMADIN) 5 MG tablet Take 1 tablet (5 mg) by mouth daily. 12/12/22   Marinda Elk, MD     Vital Signs:   Code Status:   Physical Exam  Imaging: No results found.  Labs:  CBC: Recent Labs    12/15/22 1419 12/20/22 0754 12/29/22 1345 01/02/23 1142  WBC 3.7* 3.6* 6.1 4.8  HGB 9.4* 8.0* 8.1* 8.7*  HCT 30.5* 26.2* 26.8* 28.1*  PLT 191 221 206 211    COAGS: Recent Labs    11/28/22 0844 11/30/22 1204 12/09/22 0359 12/15/22 1425 12/20/22 0754 12/29/22 1345 01/02/23 1142  INR 1.2 1.1   < > 1.8* 2.5* 2.4* 2.7*  APTT 49* 41*  --   --   --   --  61*   < > = values in this interval not displayed.    BMP: Recent Labs    12/11/22 0355 12/12/22 0418 12/15/22 1419 01/01/23 1236 01/02/23 1142  NA 138 138 140 143 135  K 4.8 4.9 5.3* 6.0* 4.5  CL 109 106 107 107* 105  CO2 21* 21* 23 17* 19*  GLUCOSE 121* 107* 135* 165* 115*  BUN 53* 55* 50* 57* 72*  CALCIUM 8.3* 8.1* 9.0 9.3 8.9  CREATININE 3.04* 3.21* 3.53* 3.14* 3.32*  GFRNONAA 17* 16* 14*  --   15*    LIVER FUNCTION TESTS: Recent Labs    12/11/22 0355 12/12/22 0418 12/15/22 1419 01/02/23 1142  BILITOT 0.6 0.6 0.5 0.4  AST 12* 12* 11* 10*  ALT 7 5 <5 9  ALKPHOS 67 75 83 91  PROT 5.9* 6.1* 6.8 7.4  ALBUMIN 2.2* 2.2* 3.4* 3.1*    Assessment and Plan: 57 year old female with history of 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  Latest nephrostogram on 11/29/2022 revealed patency of the left ureter to the level of the urinary bladder there was uncertainty whether contrast passed into the urinary bladder  or if there was persistent fistulous connection between the distal end of the bladder and the vagina.  She presents today for final attempt at left-sided ureteral stent placement.  Details/risks of procedure, including but not limited to, internal bleeding, infection, injury to adjacent structures, inability to place stent discussed with patient with her understanding and consent.   Electronically Signed: D. Jeananne Rama, PA-C 01/08/2023, 5:52 PM   I spent a total of  20 minutes at the the patient's bedside AND on the patient's hospital floor or unit, greater than 50% of which was counseling/coordinating care for attempting conversion of left nephrostomy to nephroureteral catheter/ureteral stent

## 2023-01-09 ENCOUNTER — Ambulatory Visit (HOSPITAL_COMMUNITY)
Admit: 2023-01-09 | Discharge: 2023-01-09 | Disposition: A | Discharge status: Home / Self Care | Payer: 59 | Attending: Radiology | Admitting: Radiology

## 2023-01-09 ENCOUNTER — Other Ambulatory Visit (HOSPITAL_COMMUNITY): Payer: Self-pay | Admitting: Interventional Radiology

## 2023-01-09 ENCOUNTER — Ambulatory Visit (HOSPITAL_COMMUNITY)
Admission: RE | Admit: 2023-01-09 | Discharge: 2023-01-09 | Disposition: A | Discharge status: Home / Self Care | Payer: 59 | Source: Ambulatory Visit | Attending: Radiology | Admitting: Radiology

## 2023-01-09 ENCOUNTER — Other Ambulatory Visit (HOSPITAL_COMMUNITY): Payer: Self-pay | Admitting: Radiology

## 2023-01-09 ENCOUNTER — Ambulatory Visit (HOSPITAL_COMMUNITY)
Admission: RE | Admit: 2023-01-09 | Discharge: 2023-01-09 | Disposition: A | Discharge status: Home / Self Care | Payer: 59 | Source: Ambulatory Visit | Attending: Family | Admitting: Family

## 2023-01-09 ENCOUNTER — Encounter (HOSPITAL_COMMUNITY): Payer: Self-pay

## 2023-01-09 DIAGNOSIS — C530 Malignant neoplasm of endocervix: Secondary | ICD-10-CM | POA: Insufficient documentation

## 2023-01-09 DIAGNOSIS — N131 Hydronephrosis with ureteral stricture, not elsewhere classified: Secondary | ICD-10-CM | POA: Insufficient documentation

## 2023-01-09 DIAGNOSIS — C541 Malignant neoplasm of endometrium: Secondary | ICD-10-CM | POA: Diagnosis not present

## 2023-01-09 DIAGNOSIS — N390 Urinary tract infection, site not specified: Secondary | ICD-10-CM

## 2023-01-09 DIAGNOSIS — Z452 Encounter for adjustment and management of vascular access device: Secondary | ICD-10-CM | POA: Diagnosis not present

## 2023-01-09 DIAGNOSIS — N135 Crossing vessel and stricture of ureter without hydronephrosis: Secondary | ICD-10-CM | POA: Diagnosis not present

## 2023-01-09 HISTORY — PX: IR IMAGING GUIDED PORT INSERTION: IMG5740

## 2023-01-09 HISTORY — PX: IR CONVERT LEFT NEPHROSTOMY TO NEPHROURETERAL CATH: IMG6067

## 2023-01-09 HISTORY — PX: IR DIL URETER LEFT: IMG2264

## 2023-01-09 LAB — CBC WITH DIFFERENTIAL/PLATELET
Abs Immature Granulocytes: 0.06 10*3/uL (ref 0.00–0.07)
Basophils Absolute: 0.1 10*3/uL (ref 0.0–0.1)
Basophils Relative: 1 %
Eosinophils Absolute: 0.3 10*3/uL (ref 0.0–0.5)
Eosinophils Relative: 5 %
HCT: 27 % — ABNORMAL LOW (ref 36.0–46.0)
Hemoglobin: 8.5 g/dL — ABNORMAL LOW (ref 12.0–15.0)
Immature Granulocytes: 1 %
Lymphocytes Relative: 16 %
Lymphs Abs: 0.9 10*3/uL (ref 0.7–4.0)
MCH: 30.2 pg (ref 26.0–34.0)
MCHC: 31.5 g/dL (ref 30.0–36.0)
MCV: 96.1 fL (ref 80.0–100.0)
Monocytes Absolute: 0.5 10*3/uL (ref 0.1–1.0)
Monocytes Relative: 8 %
Neutro Abs: 3.7 10*3/uL (ref 1.7–7.7)
Neutrophils Relative %: 69 %
Platelets: 243 10*3/uL (ref 150–400)
RBC: 2.81 MIL/uL — ABNORMAL LOW (ref 3.87–5.11)
RDW: 17.8 % — ABNORMAL HIGH (ref 11.5–15.5)
WBC: 5.5 10*3/uL (ref 4.0–10.5)
nRBC: 0 % (ref 0.0–0.2)

## 2023-01-09 LAB — LAB REPORT - SCANNED: EGFR: 15

## 2023-01-09 LAB — BASIC METABOLIC PANEL
Anion gap: 8 (ref 5–15)
BUN: 84 mg/dL — ABNORMAL HIGH (ref 6–20)
CO2: 18 mmol/L — ABNORMAL LOW (ref 22–32)
Calcium: 9.3 mg/dL (ref 8.9–10.3)
Chloride: 109 mmol/L (ref 98–111)
Creatinine, Ser: 3.37 mg/dL — ABNORMAL HIGH (ref 0.44–1.00)
GFR, Estimated: 15 mL/min — ABNORMAL LOW (ref >60)
Glucose, Bld: 117 mg/dL — ABNORMAL HIGH (ref 70–99)
Potassium: 5.3 mmol/L — ABNORMAL HIGH (ref 3.5–5.1)
Sodium: 135 mmol/L (ref 135–145)

## 2023-01-09 LAB — PROTIME-INR
INR: 1.5 — ABNORMAL HIGH (ref 0.8–1.2)
Prothrombin Time: 17.9 seconds — ABNORMAL HIGH (ref 11.4–15.2)

## 2023-01-09 LAB — GLUCOSE, CAPILLARY: Glucose-Capillary: 102 mg/dL — ABNORMAL HIGH (ref 70–99)

## 2023-01-09 MED ORDER — SODIUM CHLORIDE 0.9 % IV SOLN: INTRAVENOUS | Status: DC

## 2023-01-09 MED ORDER — FENTANYL CITRATE (PF) 100 MCG/2ML IJ SOLN
INTRAMUSCULAR | Status: AC | PRN
  Administered 2023-01-09 (×3): 50 ug via INTRAVENOUS

## 2023-01-09 MED ORDER — LIDOCAINE-EPINEPHRINE 1 %-1:100000 IJ SOLN
20.0000 mL | Freq: Once | INTRAMUSCULAR | Status: AC
  Administered 2023-01-09: 20 mL via INTRADERMAL

## 2023-01-09 MED ORDER — SODIUM CHLORIDE 0.9 % IV SOLN
2.0000 g | Freq: Once | INTRAVENOUS | Status: AC
  Administered 2023-01-09: 2 g via INTRAVENOUS
  Filled 2023-01-09: qty 20

## 2023-01-09 MED ORDER — IOHEXOL 300 MG/ML  SOLN
50.0000 mL | Freq: Once | INTRAMUSCULAR | Status: AC | PRN
  Administered 2023-01-09: 20 mL

## 2023-01-09 MED ORDER — HEPARIN SOD (PORK) LOCK FLUSH 100 UNIT/ML IV SOLN
INTRAVENOUS | Status: AC
  Filled 2023-01-09: qty 5

## 2023-01-09 MED ORDER — FENTANYL CITRATE (PF) 100 MCG/2ML IJ SOLN
INTRAMUSCULAR | Status: AC
  Filled 2023-01-09: qty 2

## 2023-01-09 MED ORDER — LIDOCAINE-EPINEPHRINE 1 %-1:100000 IJ SOLN
INTRAMUSCULAR | Status: AC
  Filled 2023-01-09: qty 1

## 2023-01-09 MED ORDER — LIDOCAINE-EPINEPHRINE 1 %-1:100000 IJ SOLN
20.0000 mL | Freq: Once | INTRAMUSCULAR | Status: AC
  Administered 2023-01-09: 7 mL via INTRADERMAL

## 2023-01-09 MED ORDER — MIDAZOLAM HCL 2 MG/2ML IJ SOLN
INTRAMUSCULAR | Status: AC
  Filled 2023-01-09: qty 4

## 2023-01-09 MED ORDER — HEPARIN SOD (PORK) LOCK FLUSH 100 UNIT/ML IV SOLN
500.0000 [IU] | Freq: Once | INTRAVENOUS | Status: AC
  Administered 2023-01-09: 500 [IU] via INTRAVENOUS

## 2023-01-09 MED ORDER — MIDAZOLAM HCL 2 MG/2ML IJ SOLN
INTRAMUSCULAR | Status: AC | PRN
  Administered 2023-01-09 (×3): 1 mg via INTRAVENOUS

## 2023-01-09 NOTE — Discharge Instructions (Signed)

## 2023-01-09 NOTE — Procedures (Signed)
Interventional Radiology Procedure Note  Procedure:  1) Ultrasound and fluoroscopic guided port placement 2) Nephroureterogram 3) Left distal ureteroplasty 2) Conversion of left nephrostomy to left nephroureteral catheter  Findings: Please refer to procedural dictation for full description.  Right internal jugular port, single lumen.    Complications: None immediate  Estimated Blood Loss: < 5 mL  Recommendations: Port ready for immediate access and use. Keep left nephroureteral catheter capped. Plan to return to IR in 2-3 weeks for double J nephroureteral stent placement if capping tolerated.   Marliss Coots, MD Pager: [phone]

## 2023-01-09 NOTE — Sedation Documentation (Signed)
Port placed 

## 2023-01-09 NOTE — Sedation Documentation (Signed)
Repositioned pt for neph stent. [id number]

## 2023-01-10 ENCOUNTER — Encounter (HOSPITAL_BASED_OUTPATIENT_CLINIC_OR_DEPARTMENT_OTHER): Payer: Self-pay | Admitting: Urology

## 2023-01-10 ENCOUNTER — Other Ambulatory Visit: Payer: Self-pay

## 2023-01-10 NOTE — Progress Notes (Addendum)
Spoke w/ via phone for pre-op interview---pt Lab needs dos----  pt             Lab results------EKG 01-02-2023 epic, echo 12-11-2022 epic, bmet, pt, cbc with dif 01-09-2023 COVID test -----patient states asymptomatic no test needed Arrive at -------1115 am 01-12-2023 NPO after MN NO Solid Food.  Clear liquids from MN until---1015 am Med rec completed Medications to take morning of surgery -----carvedilol, gabapentin, mybetriq, venlafaxine, oxycodone prn Diabetic medication -----take 1/2 dose levemir insulin pm night before surgery (take 10 units), no diabetic medications day of surgery Patient instructed no nail polish to be worn day of surgery Patient instructed to bring photo id and insurance card day of surgery Patient aware to have Driver (ride ) / caregiver   driver caregiver breon burke, , caregiver: Jackson Latino  (family) for 24 hours after surgery  Patient Special Instructions -----none Pre-Op special Instructions -----none Patient verbalized understanding of instructions that were given at this phone interview. Patient denies shortness of breath, chest pain, fever, cough at this phone interview.  Coumadin management note sarsh carter np oncology dated 12-29-2022 on chart/epic  Reviewed patient history and pneumonia 12-03-2022 with dr g stoltzfus mda, pt ok for [facility] surgery 01-12-2023 per dr g stoltzfus mda

## 2023-01-11 DIAGNOSIS — N12 Tubulo-interstitial nephritis, not specified as acute or chronic: Secondary | ICD-10-CM | POA: Diagnosis not present

## 2023-01-11 DIAGNOSIS — T83512D Infection and inflammatory reaction due to nephrostomy catheter, subsequent encounter: Secondary | ICD-10-CM | POA: Diagnosis not present

## 2023-01-11 DIAGNOSIS — N1831 Chronic kidney disease, stage 3a: Secondary | ICD-10-CM | POA: Diagnosis not present

## 2023-01-11 DIAGNOSIS — A4150 Gram-negative sepsis, unspecified: Secondary | ICD-10-CM | POA: Diagnosis not present

## 2023-01-11 DIAGNOSIS — I82501 Chronic embolism and thrombosis of unspecified deep veins of right lower extremity: Secondary | ICD-10-CM | POA: Diagnosis not present

## 2023-01-11 DIAGNOSIS — C541 Malignant neoplasm of endometrium: Secondary | ICD-10-CM | POA: Diagnosis not present

## 2023-01-11 DIAGNOSIS — E1122 Type 2 diabetes mellitus with diabetic chronic kidney disease: Secondary | ICD-10-CM | POA: Diagnosis not present

## 2023-01-11 DIAGNOSIS — D638 Anemia in other chronic diseases classified elsewhere: Secondary | ICD-10-CM | POA: Diagnosis not present

## 2023-01-11 DIAGNOSIS — I129 Hypertensive chronic kidney disease with stage 1 through stage 4 chronic kidney disease, or unspecified chronic kidney disease: Secondary | ICD-10-CM | POA: Diagnosis not present

## 2023-01-11 DIAGNOSIS — N390 Urinary tract infection, site not specified: Secondary | ICD-10-CM | POA: Diagnosis not present

## 2023-01-12 ENCOUNTER — Ambulatory Visit (HOSPITAL_BASED_OUTPATIENT_CLINIC_OR_DEPARTMENT_OTHER)
Admission: RE | Admit: 2023-01-12 | Discharge: 2023-01-12 | Disposition: A | Discharge status: Home / Self Care | Payer: 59 | Attending: Urology | Admitting: Urology

## 2023-01-12 ENCOUNTER — Ambulatory Visit (HOSPITAL_BASED_OUTPATIENT_CLINIC_OR_DEPARTMENT_OTHER): Payer: 59 | Admitting: Anesthesiology

## 2023-01-12 ENCOUNTER — Encounter (HOSPITAL_BASED_OUTPATIENT_CLINIC_OR_DEPARTMENT_OTHER): Payer: Self-pay | Admitting: Urology

## 2023-01-12 ENCOUNTER — Encounter (HOSPITAL_BASED_OUTPATIENT_CLINIC_OR_DEPARTMENT_OTHER)
Admission: RE | Disposition: A | Discharge status: Home / Self Care | Payer: Self-pay | Source: Home / Self Care | Attending: Urology

## 2023-01-12 DIAGNOSIS — Z79899 Other long term (current) drug therapy: Secondary | ICD-10-CM | POA: Insufficient documentation

## 2023-01-12 DIAGNOSIS — N821 Other female urinary-genital tract fistulae: Secondary | ICD-10-CM | POA: Diagnosis not present

## 2023-01-12 DIAGNOSIS — N3289 Other specified disorders of bladder: Secondary | ICD-10-CM | POA: Diagnosis not present

## 2023-01-12 DIAGNOSIS — I130 Hypertensive heart and chronic kidney disease with heart failure and stage 1 through stage 4 chronic kidney disease, or unspecified chronic kidney disease: Secondary | ICD-10-CM

## 2023-01-12 DIAGNOSIS — Z86718 Personal history of other venous thrombosis and embolism: Secondary | ICD-10-CM | POA: Diagnosis not present

## 2023-01-12 DIAGNOSIS — N3281 Overactive bladder: Secondary | ICD-10-CM | POA: Diagnosis not present

## 2023-01-12 DIAGNOSIS — N1330 Unspecified hydronephrosis: Secondary | ICD-10-CM | POA: Diagnosis not present

## 2023-01-12 DIAGNOSIS — Z90710 Acquired absence of both cervix and uterus: Secondary | ICD-10-CM | POA: Diagnosis not present

## 2023-01-12 DIAGNOSIS — C7951 Secondary malignant neoplasm of bone: Secondary | ICD-10-CM | POA: Diagnosis not present

## 2023-01-12 DIAGNOSIS — I509 Heart failure, unspecified: Secondary | ICD-10-CM | POA: Diagnosis not present

## 2023-01-12 DIAGNOSIS — Z794 Long term (current) use of insulin: Secondary | ICD-10-CM | POA: Diagnosis not present

## 2023-01-12 DIAGNOSIS — I129 Hypertensive chronic kidney disease with stage 1 through stage 4 chronic kidney disease, or unspecified chronic kidney disease: Secondary | ICD-10-CM | POA: Insufficient documentation

## 2023-01-12 DIAGNOSIS — N131 Hydronephrosis with ureteral stricture, not elsewhere classified: Secondary | ICD-10-CM | POA: Diagnosis not present

## 2023-01-12 DIAGNOSIS — E1122 Type 2 diabetes mellitus with diabetic chronic kidney disease: Secondary | ICD-10-CM | POA: Diagnosis not present

## 2023-01-12 DIAGNOSIS — C541 Malignant neoplasm of endometrium: Secondary | ICD-10-CM | POA: Diagnosis not present

## 2023-01-12 DIAGNOSIS — Z01818 Encounter for other preprocedural examination: Secondary | ICD-10-CM

## 2023-01-12 DIAGNOSIS — Z466 Encounter for fitting and adjustment of urinary device: Secondary | ICD-10-CM

## 2023-01-12 DIAGNOSIS — Z923 Personal history of irradiation: Secondary | ICD-10-CM | POA: Insufficient documentation

## 2023-01-12 DIAGNOSIS — Z9221 Personal history of antineoplastic chemotherapy: Secondary | ICD-10-CM | POA: Diagnosis not present

## 2023-01-12 DIAGNOSIS — N3592 Unspecified urethral stricture, female: Secondary | ICD-10-CM | POA: Diagnosis not present

## 2023-01-12 DIAGNOSIS — N1339 Other hydronephrosis: Secondary | ICD-10-CM

## 2023-01-12 DIAGNOSIS — N189 Chronic kidney disease, unspecified: Secondary | ICD-10-CM | POA: Diagnosis not present

## 2023-01-12 HISTORY — PX: CYSTOSCOPY W/ RETROGRADES: SHX1426

## 2023-01-12 HISTORY — DX: Presence of spectacles and contact lenses: Z97.3

## 2023-01-12 HISTORY — PX: BOTOX INJECTION: SHX5754

## 2023-01-12 HISTORY — PX: CYSTOSCOPY WITH FULGERATION: SHX6638

## 2023-01-12 HISTORY — DX: Dependence on other enabling machines and devices: Z99.89

## 2023-01-12 LAB — GLUCOSE, CAPILLARY: Glucose-Capillary: 111 mg/dL — ABNORMAL HIGH (ref 70–99)

## 2023-01-12 LAB — PROTIME-INR
INR: 1.3 — ABNORMAL HIGH (ref 0.8–1.2)
Prothrombin Time: 16.5 seconds — ABNORMAL HIGH (ref 11.4–15.2)

## 2023-01-12 SURGERY — CYSTOSCOPY, WITH RETROGRADE PYELOGRAM
Anesthesia: General | Site: Ureter | Laterality: Right

## 2023-01-12 MED ORDER — ONDANSETRON HCL 4 MG/2ML IJ SOLN: 4.0000 mg | Freq: Once | INTRAMUSCULAR | Status: DC | PRN

## 2023-01-12 MED ORDER — FENTANYL CITRATE (PF) 100 MCG/2ML IJ SOLN
INTRAMUSCULAR | Status: DC | PRN
  Administered 2023-01-12 (×4): 25 ug via INTRAVENOUS

## 2023-01-12 MED ORDER — DEXAMETHASONE SODIUM PHOSPHATE 10 MG/ML IJ SOLN
INTRAMUSCULAR | Status: AC
  Filled 2023-01-12: qty 1

## 2023-01-12 MED ORDER — ACETAMINOPHEN 500 MG PO TABS
ORAL_TABLET | ORAL | Status: AC
  Filled 2023-01-12: qty 2

## 2023-01-12 MED ORDER — ACETAMINOPHEN 500 MG PO TABS
1000.0000 mg | ORAL_TABLET | Freq: Once | ORAL | Status: AC
  Administered 2023-01-12: 1000 mg via ORAL

## 2023-01-12 MED ORDER — FENTANYL CITRATE (PF) 100 MCG/2ML IJ SOLN
INTRAMUSCULAR | Status: AC
  Filled 2023-01-12: qty 2

## 2023-01-12 MED ORDER — ONDANSETRON HCL 4 MG/2ML IJ SOLN
INTRAMUSCULAR | Status: AC
  Filled 2023-01-12: qty 2

## 2023-01-12 MED ORDER — IOHEXOL 300 MG/ML  SOLN
INTRAMUSCULAR | Status: DC | PRN
  Administered 2023-01-12: 5 mL via URETHRAL

## 2023-01-12 MED ORDER — PROPOFOL 10 MG/ML IV BOLUS
INTRAVENOUS | Status: DC | PRN
  Administered 2023-01-12: 120 mg via INTRAVENOUS

## 2023-01-12 MED ORDER — WATER FOR IRRIGATION, STERILE IR SOLN
Status: DC | PRN
  Administered 2023-01-12 (×2): 3000 mL via URETHRAL

## 2023-01-12 MED ORDER — EPHEDRINE SULFATE (PRESSORS) 50 MG/ML IJ SOLN
INTRAMUSCULAR | Status: DC | PRN
  Administered 2023-01-12: 10 mg via INTRAVENOUS

## 2023-01-12 MED ORDER — LIDOCAINE HCL (CARDIAC) PF 100 MG/5ML IV SOSY
PREFILLED_SYRINGE | INTRAVENOUS | Status: DC | PRN
  Administered 2023-01-12: 60 mg via INTRAVENOUS

## 2023-01-12 MED ORDER — LIDOCAINE HCL (PF) 2 % IJ SOLN
INTRAMUSCULAR | Status: AC
  Filled 2023-01-12: qty 5

## 2023-01-12 MED ORDER — PHENYLEPHRINE HCL (PRESSORS) 10 MG/ML IV SOLN
INTRAVENOUS | Status: DC | PRN
  Administered 2023-01-12 (×3): 80 ug via INTRAVENOUS

## 2023-01-12 MED ORDER — ONDANSETRON HCL 4 MG/2ML IJ SOLN
INTRAMUSCULAR | Status: DC | PRN
  Administered 2023-01-12: 4 mg via INTRAVENOUS

## 2023-01-12 MED ORDER — DEXAMETHASONE SODIUM PHOSPHATE 4 MG/ML IJ SOLN
INTRAMUSCULAR | Status: DC | PRN
  Administered 2023-01-12: 5 mg via INTRAVENOUS

## 2023-01-12 MED ORDER — OXYCODONE HCL 5 MG PO TABS: 5.0000 mg | ORAL_TABLET | Freq: Once | ORAL | Status: DC | PRN

## 2023-01-12 MED ORDER — FENTANYL CITRATE (PF) 100 MCG/2ML IJ SOLN: 25.0000 ug | INTRAMUSCULAR | Status: DC | PRN

## 2023-01-12 MED ORDER — SODIUM CHLORIDE (PF) 0.9 % IJ SOLN
INTRAMUSCULAR | Status: DC | PRN
  Administered 2023-01-12: 20 mL

## 2023-01-12 MED ORDER — ONABOTULINUMTOXINA 100 UNITS IJ SOLR
INTRAMUSCULAR | Status: DC | PRN
  Administered 2023-01-12: 100 [IU] via INTRAMUSCULAR

## 2023-01-12 MED ORDER — SODIUM CHLORIDE 0.9 % IV SOLN: INTRAVENOUS | Status: DC

## 2023-01-12 MED ORDER — CEFAZOLIN SODIUM-DEXTROSE 2-4 GM/100ML-% IV SOLN
2.0000 g | INTRAVENOUS | Status: AC
  Administered 2023-01-12: 2 g via INTRAVENOUS

## 2023-01-12 MED ORDER — OXYCODONE HCL 5 MG/5ML PO SOLN: 5.0000 mg | Freq: Once | ORAL | Status: DC | PRN

## 2023-01-12 MED ORDER — CEFAZOLIN SODIUM-DEXTROSE 2-4 GM/100ML-% IV SOLN
INTRAVENOUS | Status: AC
  Filled 2023-01-12: qty 100

## 2023-01-12 SURGICAL SUPPLY — 26 items
BAG DRAIN URO-CYSTO SKYTR STRL (DRAIN) ×3 IMPLANT
BAG DRN UROCATH (DRAIN) ×3
CATH URETL OPEN 5X70 (CATHETERS) ×3 IMPLANT
CLOTH BEACON ORANGE TIMEOUT ST (SAFETY) ×3 IMPLANT
ELECT REM PT RETURN 9FT ADLT (ELECTROSURGICAL) ×3
ELECTRODE REM PT RTRN 9FT ADLT (ELECTROSURGICAL) ×3 IMPLANT
GLOVE BIO SURGEON STRL SZ7 (GLOVE) ×3 IMPLANT
GLOVE BIOGEL PI IND STRL 6.5 (GLOVE) IMPLANT
GOWN STRL REUS W/ TWL LRG LVL3 (GOWN DISPOSABLE) IMPLANT
GOWN STRL REUS W/TWL LRG LVL3 (GOWN DISPOSABLE) ×6 IMPLANT
GUIDEWIRE STR DUAL SENSOR (WIRE) ×3 IMPLANT
IV NS 1000ML (IV SOLUTION) ×3
IV NS 1000ML BAXH (IV SOLUTION) ×3 IMPLANT
IV NS IRRIG 3000ML ARTHROMATIC (IV SOLUTION) ×3 IMPLANT
KIT TURNOVER CYSTO (KITS) ×3 IMPLANT
MANIFOLD NEPTUNE II (INSTRUMENTS) ×3 IMPLANT
NDL ASPIRATION 22 (NEEDLE) ×3 IMPLANT
NEEDLE ASPIRATION 22 (NEEDLE) ×3 IMPLANT
PACK CYSTO (CUSTOM PROCEDURE TRAY) ×3 IMPLANT
SLEEVE SCD COMPRESS KNEE MED (STOCKING) ×3 IMPLANT
STENT URET 6FRX24 CONTOUR (STENTS) IMPLANT
SYR 10ML LL (SYRINGE) ×3 IMPLANT
SYR 20ML LL LF (SYRINGE) IMPLANT
SYR CONTROL 10ML LL (SYRINGE) ×3 IMPLANT
TUBE CONNECTING 12X1/4 (SUCTIONS) ×3 IMPLANT
WATER STERILE IRR 3000ML UROMA (IV SOLUTION) IMPLANT

## 2023-01-12 NOTE — H&P (Signed)
Office Visit Report     11/24/2022   --------------------------------------------------------------------------------   Jaime Gomez  MRN: [id number]  DOB: 22-Aug-1965, 57 year old Female  SSN:    PRIMARY CARE:     REFERRING:  Jannifer Hick, MD  PROVIDER:  Jettie Pagan, M.D.  LOCATION:  [facility] [phone]     --------------------------------------------------------------------------------   CC/HPI: Jaime Gomez is a 57 y.o. female who has a history of metastatic endometrial carcinoma and malignant hydronephrosis and left ureterovaginal fistula.   #1. Malignant bilateral hydronephrosis:  She relocated to [facility] from [place] in January 2023. She was managed by Dr. Darrow Bussing (urology) with malignant bilateral ureteral obstruction from her metastatic endometrial carcinoma.  -She was diagnosed with ovarian/uterine cancer in 2012. She underwent total hysterectomy with b/l PLND. She had adjuvant radiation. She had a quick recurrence and was treated with gemticabine/carboplatinum in 2012, again treated with chemotherapy in 2018 and placed on keytruda in 2019. She had progressive disease and received piqray until 2020. She again had radiation in 2021.  -She also has a history of chronic kidney diseae.  She states that she has had either bilateral ureteral stents or nephrostomy tubes since 2021. Her stents were removed in September 2022. Since then, she does complain of intermittent bilateral flank pain. After the stents were removed, she has suffered worsening chronic kidney disease. He presented the hospital on 09/01/2021 with acute renal failure with creatinine 8.45 from 5.7 a week prior. Per chart review, her creatinine was 3 in 10/2020 when she had stents in place.  -CT A/P 09/01/2021 demonstrated new left emphysematous pyelitis with unchanged moderate and mild left hydroureteronephrosis. She has punctate right nephrolithiasis and had thick walled decompressed bladder  consistent with cystitis.  -S/p right stent placement 09/01/2021. 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  -S/p left nephrostomy tube 09/03/2021  -IR attempted to place L PCNU on 11/02/21 however identified a complete left distal ureteral obstruction and thus her left PCN was exchanged.  -IR attempted to place L PCNU on 01/20/2022 and identified a fistula from the distal left ureter to the vagina. She underwent L PCN exchange on 01/20/2022  -S/p right ureteral stent exchange on 05/19/2022.  -She is due for left nephrostomy tube exchange orders placed in 05/2022 however this not done. Orders replaced in 11/2022.   2. Left ureterovaginal fistula:  -Pt complained of mild leakage per vagina since 10/2021 after there was intent to place a PCNU however a ureteral obstruction was not identified. She was confirmed to have a left ureterovaginal fistula on 01/20/2022 when IR attempted to place a she wears about 3 pads per day. She states most of her urine drains out of her left PCN.  -At this time, she understands that a reconstructive procedure would be fraught with complications given her progression of her metastatic disease.   #3. Urinary urgency: She has had possible urinary urgency over the past year. Unfortunately, Myrbetriq was cost prohibitive. She is currently taking oxybutynin 10 mg daily. She would like to consider third line treatments.   She is following with Dr. Myna Hidalgo outpatient to manage her metastatic endometrial carcinoma. Unfortunately, she has had some progression into her sacrum. She recently completed radiation treatment to her sacrum.     ALLERGIES: Docetaxel Anhydrous    MEDICATIONS: Atorvastatin Calcium  Eliquis  Everolimus  Folic Acid  Insulin Aspart  Letrozole  Magnesium  Miralax  Nitrofurantoin  Ondansetron Hcl  Oxycodone-Acetaminophen  Pravastatin Sodium     GU PSH: Cystoscopy Insert Stent, Right - 05/19/2022      NON-GU PSH: Hernia Repair - 2013 Hysterectomy - 2012 Remove Kidney Stone - 2019     GU PMH: Hydronephrosis - 05/29/2022, - 03/06/2022, - 01/03/2022, - 09/30/2021 Oth Fistula Female urinary-genital Tract - 05/29/2022, - 03/06/2022 Urinary Urgency - 05/29/2022 Ovarian Cancer, Unspec Uterine Cancer, part Unspec    NON-GU PMH: Acute embolism and thrombosis of unspecified deep veins of left lower extremity    FAMILY HISTORY: Blood in the urine - Runs in Family Diabetes - Runs in Family   SOCIAL HISTORY: Marital Status: Single Ethnicity: Not Hispanic Or Latino; Race: White Current Smoking Status: Patient has never smoked.   Tobacco Use Assessment Completed: Used Tobacco in last 30 days? Has never drank.  Drinks 1 caffeinated drink per day.    REVIEW OF SYSTEMS:    GU Review Female:   Patient denies frequent urination, hard to postpone urination, burning /pain with urination, get up at night to urinate, leakage of urine, stream starts and stops, trouble starting your stream, have to strain to urinate, and being pregnant.  Gastrointestinal (Upper):   Patient denies nausea, vomiting, and indigestion/ heartburn.  Gastrointestinal (Lower):   Patient denies diarrhea and constipation.  Constitutional:   Patient denies fever, night sweats, weight loss, and fatigue.  Skin:   Patient denies skin rash/ lesion and itching.  Eyes:   Patient denies blurred vision and double vision.  Ears/ Nose/ Throat:   Patient denies sore throat and sinus problems.  Hematologic/Lymphatic:   Patient denies swollen glands and easy bruising.  Cardiovascular:   Patient denies leg swelling and chest pains.  Respiratory:   Patient denies cough and shortness of breath.  Endocrine:   Patient denies excessive thirst.  Musculoskeletal:   Patient denies back pain and joint pain.  Neurological:   Patient denies headaches and dizziness.  Psychologic:   Patient denies depression and anxiety.   VITAL SIGNS: None    MULTI-SYSTEM PHYSICAL EXAMINATION:    Constitutional: Well-nourished. No physical deformities. Normally developed. Good grooming.  Respiratory: No labored breathing, no use of accessory muscles.   Cardiovascular: Normal temperature, normal extremity pulses, no swelling, no varicosities.  Gastrointestinal: No mass, no tenderness, no rigidity, non obese abdomen.     Complexity of Data:  Source Of History:  Patient, Medical Record Summary  Records Review:   Previous Doctor Records, Previous Hospital Records, Previous Patient Records  Urine Test Review:   Urinalysis   PROCEDURES:          Visit Complexity - G2211          Urinalysis w/Scope - 81001 Dipstick Dipstick Cont'd Micro  Color: Yellow Bilirubin: Neg WBC/hpf: >60/hpf  Appearance: Clear Ketones: Neg RBC/hpf: 10 - 20/hpf  Specific Gravity: 1.025 Blood: 3+ Bacteria: Many (>50/hpf)  pH: 5.5 Protein: 3+ Cystals: NS (Not Seen)  Glucose: 2+ Urobilinogen: 0.2 Casts: NS (Not Seen)    Nitrites: Neg Trichomonas: Not Present    Leukocyte Esterase: 3+ Mucous: Not Present      Epithelial Cells: 0 - 5/hpf      Yeast: NS (Not Seen)      Sperm: Not Present    Notes:      ASSESSMENT:      ICD-10 Details  1 GU:   Hydronephrosis - N13.0   2   Oth Fistula Female urinary-genital Tract - N82.1   3   Urinary Urgency -  R39.15    PLAN:            Medications New Meds: Gemtesa 75 mg tablet 1 tablet PO Daily   #90  3 Refill(s)  Pharmacy Name:  [facility] [phone]  Address:  [address]   [place], [place] [address]  Phone:  [phone]  Fax:  [phone]            Orders X-Rays: Interventional Radiology Without I.V. Contrast - Left PCN exchange q 3-4 months          Schedule         Document Letter(s):  Created for Patient: Clinical Summary         Notes:   1. Malignant bilateral hydronephrosis:  -S/p right stent placement 09/01/2021  -S/p left PCN 09/03/2021  -S/p L PCN exchange on 11/02/2021 (unable to place L  PCNU given complete left distal ureteral obstruction)/ S/p L PCN exchange on 01/20/2022 that identified left ureterovaginal fistula. S/p right ureteral stent exchange 05/19/2022  -She is due for left nephrostomy tube exchange. Order placed. This should be performed every 3-4 months. Message needed to schedule.  -She was ever sent for right ureteral stent exchange with concomitant Botox injection into the bladder.   2. Left ureterovaginal fistula:  -She was confirmed to have a left ureterovaginal fistula on 01/20/2022  -We discussed that unfortunately given her progressive metastatic disease, I do not recommend a ureteral reimplantation given that risks likely outweigh the benefit. I discussed referral to tertiary center and she declines at this time.  -Will plan for L PCN exchange as above.   #3. Urinary urgency: Prescribed Gemtesa. Discussed risk and benefits. Discussed. If, we will proceed with oxybutynin 15 mg daily. Discussed the role for third line therapies and she like to proceed with Botox. SERD letter sent. Discussed risk of urine retention requiring clean intermittent catheterization.   Urology Preoperative H&P   Chief Complaint: Bilateral hydronephrosis  History of Present Illness: Jaime Gomez is a 57 y.o. female with a history of bilateral hydronephrosis managed with left percutaneous nephrostomy and right indwelling ureteral stent.  She also has a history of left distal ureteral stricture and urinary urgency.  Unfortunately, she has history of metastatic endometrial carcinoma.  She also has CKD.  She presents today for right ureteral stent exchange and Botox injection.  She recently underwent left nephroureteral stent placement with distal left ureteral plasty with Dr. Ethelle Lyon with interventional radiology.    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 failure 08/18/2021   Diabetes mellitus without complication  (HCC) type 2    Endometrial cancer, FIGO stage IVB (HCC) 08/18/2021   Goals of care, counseling/discussion 08/18/2021   History of kidney stones    Hypertension    Leg DVT (deep venous thromboembolism), chronic, right (HCC) 08/18/2021   Ovarian cancer (HCC) 2013   Pneumonia 12/03/2022   Wears glasses     Past Surgical History:  Procedure Laterality Date   ABDOMINAL HYSTERECTOMY     CYSTOSCOPY W/ URETERAL STENT PLACEMENT Bilateral 09/01/2021   Procedure: CYSTOSCOPY WITH RETROGRADE PYELOGRAM/URETERAL STENT PLACEMENT;  Surgeon: Jannifer Hick, MD;  Location: [facility];  Service: Urology;  Laterality: Bilateral;   CYSTOSCOPY WITH STENT PLACEMENT Right 05/19/2022   Procedure: CYSTOSCOPY WITH STENT CHANGE;  Surgeon: Jannifer Hick, MD;  Location: [facility];  Service: Urology;  Laterality: Right;  ONLY NEEDS 30 MIN  HERNIA REPAIR     IR CONVERT LEFT NEPHROSTOMY TO NEPHROURETERAL CATH  11/02/2021   IR CONVERT LEFT NEPHROSTOMY TO NEPHROURETERAL CATH  01/09/2023   IR CV LINE INJECTION  08/23/2021   IR DIL URETER LEFT  01/09/2023   IR IMAGING GUIDED PORT INSERTION  11/14/2021   IR IMAGING GUIDED PORT INSERTION  01/09/2023   IR NEPHROSTOMY EXCHANGE LEFT  11/02/2021   IR NEPHROSTOMY EXCHANGE LEFT  01/20/2022   IR NEPHROSTOMY EXCHANGE LEFT  11/29/2022   IR NEPHROSTOMY PLACEMENT LEFT  09/03/2021   IR REMOVAL TUN ACCESS W/ PORT W/O FL MOD SED  11/14/2021   IR REMOVAL TUN ACCESS W/ PORT W/O FL MOD SED  12/04/2022    Allergies:  Allergies  Allergen Reactions   Taxotere [Docetaxel] Swelling    Throat swelling    Family History  Problem Relation Age of Onset   Hyperlipidemia Mother    Heart disease Mother    Diabetes Mother    Depression Mother    COPD Mother    Arthritis Mother    Heart attack Mother    Drug abuse Father    Alcohol abuse Father    Hyperlipidemia Sister    Heart disease Sister    Diabetes Sister    Arthritis Sister    Miscarriages / [place] Sister    Depression Brother      Social History:  reports that she has never smoked. She has never used smokeless tobacco. She reports that she does not drink alcohol and does not use drugs.  ROS: A complete review of systems was performed.  All systems are negative except for pertinent findings as noted.  Physical Exam:  Vital signs in last 24 hours:   Constitutional:  Alert and oriented, No acute distress Cardiovascular: Regular rate and rhythm Respiratory: Normal respiratory effort, Lungs clear bilaterally GI: Abdomen is soft, nontender, nondistended, no abdominal masses GU: No CVA tenderness Lymphatic: No lymphadenopathy Neurologic: Grossly intact, no focal deficits Psychiatric: Normal mood and affect  Laboratory Data:  No results for input(s): "WBC", "HGB", "HCT", "PLT" in the last 72 hours.  No results for input(s): "NA", "K", "CL", "GLUCOSE", "BUN", "CALCIUM", "CREATININE" in the last 72 hours.  Invalid input(s): "CO3"   No results found for this or any previous visit (from the past 24 hour(s)). No results found for this or any previous visit (from the past 240 hour(s)).  Renal Function: Recent Labs    01/09/23 0800  CREATININE 3.37*   Estimated Creatinine Clearance: 15.7 mL/min (A) (by C-G formula based on SCr of 3.37 mg/dL (H)).  Radiologic Imaging: No results found.  I independently reviewed the above imaging studies.  Assessment and Plan Etheline Geppert is a 57 y.o. female with right hydronephrosis here for right ureteral stent exchange and urinary urgency here for Botox injection into the bladder.  -The risks, benefits and alternatives of cystoscopy with right JJ stent placement was discussed with the patient.  Risks include, but are not limited to: bleeding, urinary tract infection, ureteral injury, ureteral stricture disease, chronic pain, urinary symptoms, bladder injury, stent migration, the need for nephrostomy tube placement, MI, CVA, DVT, PE and the inherent risks with general  anesthesia.  The patient voices understanding and wishes to proceed.     Matt R. Geoffrey Mankin MD 01/12/2023, 9:24 AM  [facility] Pager: [phone]): [phone]

## 2023-01-12 NOTE — Discharge Instructions (Addendum)
[facility] [phone] Post Ureteroscopy With or Without Stent Instructions  Definitions:  Ureter: The duct that transports urine from the kidney to the bladder. Stent:   A plastic hollow tube that is placed into the ureter, from the kidney to the bladder to prevent the ureter from swelling shut.  GENERAL INSTRUCTIONS:  Despite the fact that no skin incisions were used, the area around the ureter and bladder is raw and irritated. The stent is a foreign body which will further irritate the bladder wall. This irritation is manifested by increased frequency of urination, both day and night, and by an increase in the urge to urinate. In some, the urge to urinate is present almost always. Sometimes the urge is strong enough that you may not be able to stop yourself from urinating. The only real cure is to remove the stent and then give time for the bladder wall to heal which can't be done until the danger of the ureter swelling shut has passed, which varies.  You may see some blood in your urine while the stent is in place and a few days afterwards. Do not be alarmed, even if the urine was clear for a while. Get off your feet and drink lots of fluids until clearing occurs. If you start to pass clots or don't improve, call us.  DIET: You may return to your normal diet immediately. Because of the raw surface of your bladder, alcohol, spicy foods, acid type foods and drinks with caffeine may cause irritation or frequency and should be used in moderation. To keep your urine flowing freely and to avoid constipation, drink plenty of fluids during the day ( 8-10 glasses ). Tip: Avoid cranberry juice because it is very acidic.  ACTIVITY: Your physical activity doesn't need to be restricted. However, if you are very active, you may see some blood in your urine. We suggest that you reduce your activity under these circumstances until the bleeding has stopped.  BOWELS: It is important to  keep your bowels regular during the postoperative period. Straining with bowel movements can cause bleeding. A bowel movement every other day is reasonable. Use a mild laxative if needed, such as Milk of Magnesia 2-3 tablespoons, or 2 Dulcolax tablets. Call if you continue to have problems. If you have been taking narcotics for pain, before, during or after your surgery, you may be constipated. Take a laxative if necessary.   MEDICATION: You should resume your pre-surgery medications unless told not to. In addition you will often be given an antibiotic to prevent infection. These should be taken as prescribed until the bottles are finished unless you are having an unusual reaction to one of the drugs.  PROBLEMS YOU SHOULD REPORT TO [place]: Fevers over 100.5 Fahrenheit. Heavy bleeding, or clots ( See above notes about blood in urine ). Inability to urinate. Drug reactions ( hives, rash, nausea, vomiting, diarrhea ). Severe burning or pain with urination that is not improving.  FOLLOW-UP: You will need a follow-up appointment to monitor your progress. Call for this appointment at the number listed above. Usually the first appointment will be about three to fourteen days after your surgery.   Post Anesthesia Home Care Instructions  Activity: Get plenty of rest for the remainder of the day. A responsible individual must stay with you for 24 hours following the procedure.  For the next 24 hours, DO NOT: -Drive a car -Advertising copywriter -Drink alcoholic beverages -Take any medication unless instructed by your physician -Make  any legal decisions or sign important papers.  Meals: Start with liquid foods such as gelatin or soup. Progress to regular foods as tolerated. Avoid greasy, spicy, heavy foods. If nausea and/or vomiting occur, drink only clear liquids until the nausea and/or vomiting subsides. Call your physician if vomiting continues.  Special Instructions/Symptoms: Your throat may feel dry  or sore from the anesthesia or the breathing tube placed in your throat during surgery. If this causes discomfort, gargle with warm salt water. The discomfort should disappear within 24 hours.  If you had a scopolamine patch placed behind your ear for the management of post- operative nausea and/or vomiting:  1. The medication in the patch is effective for 72 hours, after which it should be removed.  Wrap patch in a tissue and discard in the trash. Wash hands thoroughly with soap and water. 2. You may remove the patch earlier than 72 hours if you experience unpleasant side effects which may include dry mouth, dizziness or visual disturbances. 3. Avoid touching the patch. Wash your hands with soap and water after contact with the patch.    No acetaminophen/Tylenol until after 5:10 pm today if needed.

## 2023-01-12 NOTE — OR Nursing (Signed)
Right ureteral stent was removed by Dr. Cardell Peach and was discarded.

## 2023-01-12 NOTE — Anesthesia Postprocedure Evaluation (Signed)
Anesthesia Post Note  Patient: Jaime Gomez  Procedure(s) Performed: CYSTOSCOPY WITH RIGHT  RETROGRADE PYELOGRAM/ RIGHT URETERAL STENT CHANGE (Right: Ureter) BOTOX INJECTION INTO BLADDER (Bladder) CYSTOSCOPY WITH FULGERATION     Patient location during evaluation: PACU Anesthesia Type: General Level of consciousness: awake and alert Pain management: pain level controlled Vital Signs Assessment: post-procedure vital signs reviewed and stable Respiratory status: spontaneous breathing, nonlabored ventilation and respiratory function stable Cardiovascular status: stable and blood pressure returned to baseline Anesthetic complications: no   No notable events documented.  Last Vitals:  Vitals:   01/12/23 1245 01/12/23 1310  BP: 97/74 102/66  Pulse: (!) 57 (!) 55  Resp: 13 14  Temp: 36.6 C 36.6 C  SpO2: 96% 98%    Last Pain:  Vitals:   01/12/23 1310  TempSrc:   PainSc: 0-No pain                 Beryle Lathe

## 2023-01-12 NOTE — Anesthesia Preprocedure Evaluation (Addendum)
Anesthesia Evaluation  Patient identified by MRN, date of birth, ID band Patient awake    Reviewed: Allergy & Precautions, NPO status , Patient's Chart, lab work & pertinent test results, reviewed documented beta blocker date and time   History of Anesthesia Complications Negative for: history of anesthetic complications  Airway Mallampati: III  TM Distance: >3 FB Neck ROM: Full    Dental  (+) Dental Advisory Given, Teeth Intact   Pulmonary neg pulmonary ROS   Pulmonary exam normal        Cardiovascular hypertension, Pt. on home beta blockers and Pt. on medications +CHF and + DVT  Normal cardiovascular exam   '24 TTE - EF 45 to 50%. Global hypokinesis. The left ventricular internal cavity size  was mildly dilated.      Neuro/Psych Seizures -,  PSYCHIATRIC DISORDERS  Depression       GI/Hepatic negative GI ROS, Neg liver ROS,,,  Endo/Other  diabetes, Insulin Dependent    Renal/GU CRFRenal disease     Musculoskeletal negative musculoskeletal ROS (+)    Abdominal   Peds  Hematology  On coumadin Inr 1.5 three days ago    Anesthesia Other Findings   Reproductive/Obstetrics  Endometrial cancer s/p hysterectomy                                Anesthesia Physical Anesthesia Plan  ASA: 3  Anesthesia Plan: General   Post-op Pain Management: Tylenol PO (pre-op)*   Induction: Intravenous  PONV Risk Score and Plan: 3 and Treatment may vary due to age or medical condition, Ondansetron, Dexamethasone and Midazolam  Airway Management Planned: LMA  Additional Equipment: None  Intra-op Plan:   Post-operative Plan: Extubation in OR  Informed Consent: I have reviewed the patients History and Physical, chart, labs and discussed the procedure including the risks, benefits and alternatives for the proposed anesthesia with the patient or authorized representative who has indicated his/her  understanding and acceptance.     Dental advisory given  Plan Discussed with: CRNA and Anesthesiologist  Anesthesia Plan Comments:          Anesthesia Quick Evaluation

## 2023-01-12 NOTE — Op Note (Signed)
Operative Note  Preoperative diagnosis:  1.  Bilateral hydronephrosis 2. Overactive bladder  Postoperative diagnosis: Same  Procedure(s): 1.  Cystoscopy 2. Right retrograde pyelogram with interpretation 3. Right ureteral stent exchange 4. Fluoroscopy <1 hour with intraoperative interpretation 5. Intradetrusor injection of botox  Surgeon: Jettie Pagan, MD  Assistants:  None  Anesthesia:  General  Complications:  None  EBL:  Minimal  Specimens: 1. None  Drains/Catheters: 1.  Right 6Fr x 24cm ureteral stent  Intraoperative findings:   Cystoscopy demonstrated narrowed urethra with friable bladder. Left PCNU stent within bladder and right JJ stent in bladder. Right retrograde pyelogram demonstrated moderate right hydronephrosis. Successful right ureteral stent exchange  Indication:  Jaime Gomez is a 57 y.o. 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 She just had a left PCNU placed 3 days ago and presents for right ureteral stent exchange. After reviewing the management options for treatment, she elected to proceed with the above surgical procedure(s). We have discussed the potential benefits and risks of the procedure, side effects of the proposed treatment, the likelihood of the patient achieving the goals of the procedure, and any potential problems that might occur during the procedure or recuperation. Informed consent has been obtained.  Description of procedure: The patient was taken to the operating room and general anesthesia was induced.  The patient was placed in the dorsal lithotomy position, prepped and draped in the usual sterile fashion, and preoperative antibiotics were administered. A preoperative time-out was performed.   Cystourethroscopy was performed.  The patient's urethra was examined and found to be slightly narrowed.  Her left percutaneous nephroureteral stent was then placed with distal curl within  the bladder.  Right double-J stent was then placed with distal curl within the bladder.  Alligator forcep was used to grasp the distal end of the right ureteral stent and this was removed to the meatus.  A 0.038 sensor wire was then passed through the stent up to the level of the renal pelvis.  Stent was removed.  5 [place] open-ended catheter was then advanced over the wire and wire was removed.  Right retrograde pyelogram demonstrated moderate right hydronephrosis.  Wire was replaced.  5 [place] open-ended catheter was then removed.  6 [place] by 24 cm stent was placed into the kidney with distal curl within the bladder and proximal curl within the right lower pole.  Urine was seen emanating from the stent after stent positioning.   Next, scope was exchanged for continuous-flow scope.  100 units of Botox were obtained. This was mixed with 20 cc of normal saline and then it was injected throughout the bladder in 20 sites. One cc was injected at each site. The patient's bladder was drained.  There is a couple areas of minor bleeding.  I elected to use Bugbee cautery and fulgurated these areas with excellent hemostasis.  Urine was clear yellow at end of procedure.   The bladder was then emptied and the procedure ended.  The patient appeared to tolerate the procedure well and without complications.  The patient was able to be awakened and transferred to the recovery unit in satisfactory condition.   Matt R. Briston Lax MD [facility]  Pager: [phone]

## 2023-01-12 NOTE — Transfer of Care (Signed)
Immediate Anesthesia Transfer of Care Note  Patient: Jaime Gomez  Procedure(s) Performed: Procedure(s) (LRB): CYSTOSCOPY WITH RIGHT  RETROGRADE PYELOGRAM/ RIGHT URETERAL STENT CHANGE (Right) BOTOX INJECTION INTO BLADDER (N/A) CYSTOSCOPY WITH FULGERATION (N/A)  Patient Location: PACU  Anesthesia Type: General  Level of Consciousness: awake, sedated, patient cooperative and responds to stimulation  Airway & Oxygen Therapy: Patient Spontanous Breathing and Patient connected to [place] oxygen  Post-op Assessment: Report given to PACU RN, Post -op Vital signs reviewed and stable and Patient moving all extremities  Post vital signs: Reviewed and stable  Complications: No apparent anesthesia complications

## 2023-01-12 NOTE — Anesthesia Procedure Notes (Signed)
Procedure Name: LMA Insertion Date/Time: 01/12/2023 11:44 AM  Performed by: Jessica Priest, CRNAPre-anesthesia Checklist: Patient identified, Emergency Drugs available, Suction available, Patient being monitored and Timeout performed Patient Re-evaluated:Patient Re-evaluated prior to induction Oxygen Delivery Method: Circle system utilized Preoxygenation: Pre-oxygenation with 100% oxygen Induction Type: IV induction Ventilation: Mask ventilation without difficulty LMA: LMA inserted LMA Size: 3.0 Number of attempts: 1 Airway Equipment and Method: Bite block Placement Confirmation: positive ETCO2, breath sounds checked- equal and bilateral and CO2 detector Tube secured with: Tape Dental Injury: Teeth and Oropharynx as per pre-operative assessment

## 2023-01-15 ENCOUNTER — Encounter (HOSPITAL_BASED_OUTPATIENT_CLINIC_OR_DEPARTMENT_OTHER): Payer: Self-pay | Admitting: Urology

## 2023-01-16 DIAGNOSIS — E1122 Type 2 diabetes mellitus with diabetic chronic kidney disease: Secondary | ICD-10-CM | POA: Diagnosis not present

## 2023-01-16 DIAGNOSIS — D638 Anemia in other chronic diseases classified elsewhere: Secondary | ICD-10-CM | POA: Diagnosis not present

## 2023-01-16 DIAGNOSIS — N12 Tubulo-interstitial nephritis, not specified as acute or chronic: Secondary | ICD-10-CM | POA: Diagnosis not present

## 2023-01-16 DIAGNOSIS — I129 Hypertensive chronic kidney disease with stage 1 through stage 4 chronic kidney disease, or unspecified chronic kidney disease: Secondary | ICD-10-CM | POA: Diagnosis not present

## 2023-01-16 DIAGNOSIS — C541 Malignant neoplasm of endometrium: Secondary | ICD-10-CM | POA: Diagnosis not present

## 2023-01-16 DIAGNOSIS — I82501 Chronic embolism and thrombosis of unspecified deep veins of right lower extremity: Secondary | ICD-10-CM | POA: Diagnosis not present

## 2023-01-16 DIAGNOSIS — T83512D Infection and inflammatory reaction due to nephrostomy catheter, subsequent encounter: Secondary | ICD-10-CM | POA: Diagnosis not present

## 2023-01-16 DIAGNOSIS — N1831 Chronic kidney disease, stage 3a: Secondary | ICD-10-CM | POA: Diagnosis not present

## 2023-01-16 DIAGNOSIS — A4150 Gram-negative sepsis, unspecified: Secondary | ICD-10-CM | POA: Diagnosis not present

## 2023-01-16 DIAGNOSIS — N390 Urinary tract infection, site not specified: Secondary | ICD-10-CM | POA: Diagnosis not present

## 2023-01-17 DIAGNOSIS — D638 Anemia in other chronic diseases classified elsewhere: Secondary | ICD-10-CM | POA: Diagnosis not present

## 2023-01-17 DIAGNOSIS — N390 Urinary tract infection, site not specified: Secondary | ICD-10-CM | POA: Diagnosis not present

## 2023-01-17 DIAGNOSIS — N1831 Chronic kidney disease, stage 3a: Secondary | ICD-10-CM | POA: Diagnosis not present

## 2023-01-17 DIAGNOSIS — I82501 Chronic embolism and thrombosis of unspecified deep veins of right lower extremity: Secondary | ICD-10-CM | POA: Diagnosis not present

## 2023-01-17 DIAGNOSIS — N12 Tubulo-interstitial nephritis, not specified as acute or chronic: Secondary | ICD-10-CM | POA: Diagnosis not present

## 2023-01-17 DIAGNOSIS — I129 Hypertensive chronic kidney disease with stage 1 through stage 4 chronic kidney disease, or unspecified chronic kidney disease: Secondary | ICD-10-CM | POA: Diagnosis not present

## 2023-01-17 DIAGNOSIS — E1122 Type 2 diabetes mellitus with diabetic chronic kidney disease: Secondary | ICD-10-CM | POA: Diagnosis not present

## 2023-01-17 DIAGNOSIS — A4150 Gram-negative sepsis, unspecified: Secondary | ICD-10-CM | POA: Diagnosis not present

## 2023-01-17 DIAGNOSIS — T83512D Infection and inflammatory reaction due to nephrostomy catheter, subsequent encounter: Secondary | ICD-10-CM | POA: Diagnosis not present

## 2023-01-17 DIAGNOSIS — C541 Malignant neoplasm of endometrium: Secondary | ICD-10-CM | POA: Diagnosis not present

## 2023-01-18 DIAGNOSIS — A4150 Gram-negative sepsis, unspecified: Secondary | ICD-10-CM | POA: Diagnosis not present

## 2023-01-18 DIAGNOSIS — C541 Malignant neoplasm of endometrium: Secondary | ICD-10-CM | POA: Diagnosis not present

## 2023-01-18 DIAGNOSIS — N12 Tubulo-interstitial nephritis, not specified as acute or chronic: Secondary | ICD-10-CM | POA: Diagnosis not present

## 2023-01-18 DIAGNOSIS — I129 Hypertensive chronic kidney disease with stage 1 through stage 4 chronic kidney disease, or unspecified chronic kidney disease: Secondary | ICD-10-CM | POA: Diagnosis not present

## 2023-01-18 DIAGNOSIS — T83512D Infection and inflammatory reaction due to nephrostomy catheter, subsequent encounter: Secondary | ICD-10-CM | POA: Diagnosis not present

## 2023-01-18 DIAGNOSIS — E1122 Type 2 diabetes mellitus with diabetic chronic kidney disease: Secondary | ICD-10-CM | POA: Diagnosis not present

## 2023-01-18 DIAGNOSIS — N1831 Chronic kidney disease, stage 3a: Secondary | ICD-10-CM | POA: Diagnosis not present

## 2023-01-18 DIAGNOSIS — N390 Urinary tract infection, site not specified: Secondary | ICD-10-CM | POA: Diagnosis not present

## 2023-01-18 DIAGNOSIS — D638 Anemia in other chronic diseases classified elsewhere: Secondary | ICD-10-CM | POA: Diagnosis not present

## 2023-01-18 DIAGNOSIS — I82501 Chronic embolism and thrombosis of unspecified deep veins of right lower extremity: Secondary | ICD-10-CM | POA: Diagnosis not present

## 2023-01-19 ENCOUNTER — Encounter (HOSPITAL_COMMUNITY): Admission: RE | Admit: 2023-01-19 | Payer: 59 | Source: Ambulatory Visit

## 2023-01-19 DIAGNOSIS — T83512D Infection and inflammatory reaction due to nephrostomy catheter, subsequent encounter: Secondary | ICD-10-CM | POA: Diagnosis not present

## 2023-01-19 DIAGNOSIS — C541 Malignant neoplasm of endometrium: Secondary | ICD-10-CM | POA: Diagnosis not present

## 2023-01-19 DIAGNOSIS — N130 Hydronephrosis with ureteropelvic junction obstruction: Secondary | ICD-10-CM | POA: Diagnosis not present

## 2023-01-19 DIAGNOSIS — N12 Tubulo-interstitial nephritis, not specified as acute or chronic: Secondary | ICD-10-CM | POA: Diagnosis not present

## 2023-01-19 DIAGNOSIS — N1831 Chronic kidney disease, stage 3a: Secondary | ICD-10-CM | POA: Diagnosis not present

## 2023-01-19 DIAGNOSIS — I129 Hypertensive chronic kidney disease with stage 1 through stage 4 chronic kidney disease, or unspecified chronic kidney disease: Secondary | ICD-10-CM | POA: Diagnosis not present

## 2023-01-19 DIAGNOSIS — E1122 Type 2 diabetes mellitus with diabetic chronic kidney disease: Secondary | ICD-10-CM | POA: Diagnosis not present

## 2023-01-19 DIAGNOSIS — A4150 Gram-negative sepsis, unspecified: Secondary | ICD-10-CM | POA: Diagnosis not present

## 2023-01-19 DIAGNOSIS — N821 Other female urinary-genital tract fistulae: Secondary | ICD-10-CM | POA: Diagnosis not present

## 2023-01-19 DIAGNOSIS — R3915 Urgency of urination: Secondary | ICD-10-CM | POA: Diagnosis not present

## 2023-01-19 DIAGNOSIS — N390 Urinary tract infection, site not specified: Secondary | ICD-10-CM | POA: Diagnosis not present

## 2023-01-19 DIAGNOSIS — D638 Anemia in other chronic diseases classified elsewhere: Secondary | ICD-10-CM | POA: Diagnosis not present

## 2023-01-19 DIAGNOSIS — I82501 Chronic embolism and thrombosis of unspecified deep veins of right lower extremity: Secondary | ICD-10-CM | POA: Diagnosis not present

## 2023-01-20 ENCOUNTER — Other Ambulatory Visit: Payer: Self-pay | Admitting: Family Medicine

## 2023-01-22 DIAGNOSIS — C541 Malignant neoplasm of endometrium: Secondary | ICD-10-CM | POA: Diagnosis not present

## 2023-01-22 DIAGNOSIS — T83512D Infection and inflammatory reaction due to nephrostomy catheter, subsequent encounter: Secondary | ICD-10-CM | POA: Diagnosis not present

## 2023-01-22 DIAGNOSIS — E1122 Type 2 diabetes mellitus with diabetic chronic kidney disease: Secondary | ICD-10-CM | POA: Diagnosis not present

## 2023-01-22 DIAGNOSIS — I82501 Chronic embolism and thrombosis of unspecified deep veins of right lower extremity: Secondary | ICD-10-CM | POA: Diagnosis not present

## 2023-01-22 DIAGNOSIS — I129 Hypertensive chronic kidney disease with stage 1 through stage 4 chronic kidney disease, or unspecified chronic kidney disease: Secondary | ICD-10-CM | POA: Diagnosis not present

## 2023-01-22 DIAGNOSIS — D638 Anemia in other chronic diseases classified elsewhere: Secondary | ICD-10-CM | POA: Diagnosis not present

## 2023-01-22 DIAGNOSIS — N1831 Chronic kidney disease, stage 3a: Secondary | ICD-10-CM | POA: Diagnosis not present

## 2023-01-22 DIAGNOSIS — N12 Tubulo-interstitial nephritis, not specified as acute or chronic: Secondary | ICD-10-CM | POA: Diagnosis not present

## 2023-01-22 DIAGNOSIS — N390 Urinary tract infection, site not specified: Secondary | ICD-10-CM | POA: Diagnosis not present

## 2023-01-22 DIAGNOSIS — A4150 Gram-negative sepsis, unspecified: Secondary | ICD-10-CM | POA: Diagnosis not present

## 2023-01-23 ENCOUNTER — Encounter: Payer: Self-pay | Admitting: Family

## 2023-01-23 ENCOUNTER — Encounter: Payer: Self-pay | Admitting: Family Medicine

## 2023-01-23 DIAGNOSIS — D638 Anemia in other chronic diseases classified elsewhere: Secondary | ICD-10-CM | POA: Diagnosis not present

## 2023-01-23 DIAGNOSIS — I129 Hypertensive chronic kidney disease with stage 1 through stage 4 chronic kidney disease, or unspecified chronic kidney disease: Secondary | ICD-10-CM | POA: Diagnosis not present

## 2023-01-23 DIAGNOSIS — F321 Major depressive disorder, single episode, moderate: Secondary | ICD-10-CM

## 2023-01-23 DIAGNOSIS — E1122 Type 2 diabetes mellitus with diabetic chronic kidney disease: Secondary | ICD-10-CM | POA: Diagnosis not present

## 2023-01-23 DIAGNOSIS — N12 Tubulo-interstitial nephritis, not specified as acute or chronic: Secondary | ICD-10-CM | POA: Diagnosis not present

## 2023-01-23 DIAGNOSIS — N390 Urinary tract infection, site not specified: Secondary | ICD-10-CM | POA: Diagnosis not present

## 2023-01-23 DIAGNOSIS — T83512D Infection and inflammatory reaction due to nephrostomy catheter, subsequent encounter: Secondary | ICD-10-CM | POA: Diagnosis not present

## 2023-01-23 DIAGNOSIS — C541 Malignant neoplasm of endometrium: Secondary | ICD-10-CM | POA: Diagnosis not present

## 2023-01-23 DIAGNOSIS — I82501 Chronic embolism and thrombosis of unspecified deep veins of right lower extremity: Secondary | ICD-10-CM | POA: Diagnosis not present

## 2023-01-23 DIAGNOSIS — N1831 Chronic kidney disease, stage 3a: Secondary | ICD-10-CM | POA: Diagnosis not present

## 2023-01-23 DIAGNOSIS — A4150 Gram-negative sepsis, unspecified: Secondary | ICD-10-CM | POA: Diagnosis not present

## 2023-01-24 DIAGNOSIS — T83512D Infection and inflammatory reaction due to nephrostomy catheter, subsequent encounter: Secondary | ICD-10-CM | POA: Diagnosis not present

## 2023-01-24 DIAGNOSIS — E1122 Type 2 diabetes mellitus with diabetic chronic kidney disease: Secondary | ICD-10-CM | POA: Diagnosis not present

## 2023-01-24 DIAGNOSIS — C541 Malignant neoplasm of endometrium: Secondary | ICD-10-CM | POA: Diagnosis not present

## 2023-01-24 DIAGNOSIS — D638 Anemia in other chronic diseases classified elsewhere: Secondary | ICD-10-CM | POA: Diagnosis not present

## 2023-01-24 DIAGNOSIS — N390 Urinary tract infection, site not specified: Secondary | ICD-10-CM | POA: Diagnosis not present

## 2023-01-24 DIAGNOSIS — I129 Hypertensive chronic kidney disease with stage 1 through stage 4 chronic kidney disease, or unspecified chronic kidney disease: Secondary | ICD-10-CM | POA: Diagnosis not present

## 2023-01-24 DIAGNOSIS — A4150 Gram-negative sepsis, unspecified: Secondary | ICD-10-CM | POA: Diagnosis not present

## 2023-01-24 DIAGNOSIS — I82501 Chronic embolism and thrombosis of unspecified deep veins of right lower extremity: Secondary | ICD-10-CM | POA: Diagnosis not present

## 2023-01-24 DIAGNOSIS — N1831 Chronic kidney disease, stage 3a: Secondary | ICD-10-CM | POA: Diagnosis not present

## 2023-01-24 DIAGNOSIS — N12 Tubulo-interstitial nephritis, not specified as acute or chronic: Secondary | ICD-10-CM | POA: Diagnosis not present

## 2023-01-24 MED ORDER — WARFARIN SODIUM 5 MG PO TABS: 5.0000 mg | ORAL_TABLET | Freq: Every day | ORAL | 1 refills | Status: DC

## 2023-01-26 DIAGNOSIS — D638 Anemia in other chronic diseases classified elsewhere: Secondary | ICD-10-CM | POA: Diagnosis not present

## 2023-01-26 DIAGNOSIS — A4150 Gram-negative sepsis, unspecified: Secondary | ICD-10-CM | POA: Diagnosis not present

## 2023-01-26 DIAGNOSIS — N390 Urinary tract infection, site not specified: Secondary | ICD-10-CM | POA: Diagnosis not present

## 2023-01-26 DIAGNOSIS — N12 Tubulo-interstitial nephritis, not specified as acute or chronic: Secondary | ICD-10-CM | POA: Diagnosis not present

## 2023-01-26 DIAGNOSIS — C541 Malignant neoplasm of endometrium: Secondary | ICD-10-CM | POA: Diagnosis not present

## 2023-01-26 DIAGNOSIS — T83512D Infection and inflammatory reaction due to nephrostomy catheter, subsequent encounter: Secondary | ICD-10-CM | POA: Diagnosis not present

## 2023-01-26 DIAGNOSIS — N1831 Chronic kidney disease, stage 3a: Secondary | ICD-10-CM | POA: Diagnosis not present

## 2023-01-26 DIAGNOSIS — I129 Hypertensive chronic kidney disease with stage 1 through stage 4 chronic kidney disease, or unspecified chronic kidney disease: Secondary | ICD-10-CM | POA: Diagnosis not present

## 2023-01-26 DIAGNOSIS — E1122 Type 2 diabetes mellitus with diabetic chronic kidney disease: Secondary | ICD-10-CM | POA: Diagnosis not present

## 2023-01-26 DIAGNOSIS — I82501 Chronic embolism and thrombosis of unspecified deep veins of right lower extremity: Secondary | ICD-10-CM | POA: Diagnosis not present

## 2023-01-30 DIAGNOSIS — N390 Urinary tract infection, site not specified: Secondary | ICD-10-CM | POA: Diagnosis not present

## 2023-01-30 DIAGNOSIS — D638 Anemia in other chronic diseases classified elsewhere: Secondary | ICD-10-CM | POA: Diagnosis not present

## 2023-01-30 DIAGNOSIS — I129 Hypertensive chronic kidney disease with stage 1 through stage 4 chronic kidney disease, or unspecified chronic kidney disease: Secondary | ICD-10-CM | POA: Diagnosis not present

## 2023-01-30 DIAGNOSIS — N1831 Chronic kidney disease, stage 3a: Secondary | ICD-10-CM | POA: Diagnosis not present

## 2023-01-30 DIAGNOSIS — E1122 Type 2 diabetes mellitus with diabetic chronic kidney disease: Secondary | ICD-10-CM | POA: Diagnosis not present

## 2023-01-30 DIAGNOSIS — A4150 Gram-negative sepsis, unspecified: Secondary | ICD-10-CM | POA: Diagnosis not present

## 2023-01-30 DIAGNOSIS — I82501 Chronic embolism and thrombosis of unspecified deep veins of right lower extremity: Secondary | ICD-10-CM | POA: Diagnosis not present

## 2023-01-30 DIAGNOSIS — T83512D Infection and inflammatory reaction due to nephrostomy catheter, subsequent encounter: Secondary | ICD-10-CM | POA: Diagnosis not present

## 2023-01-30 DIAGNOSIS — C541 Malignant neoplasm of endometrium: Secondary | ICD-10-CM | POA: Diagnosis not present

## 2023-01-30 DIAGNOSIS — N12 Tubulo-interstitial nephritis, not specified as acute or chronic: Secondary | ICD-10-CM | POA: Diagnosis not present

## 2023-02-01 ENCOUNTER — Inpatient Hospital Stay (HOSPITAL_BASED_OUTPATIENT_CLINIC_OR_DEPARTMENT_OTHER): Payer: 59 | Admitting: Hematology & Oncology

## 2023-02-01 ENCOUNTER — Other Ambulatory Visit: Payer: Self-pay

## 2023-02-01 ENCOUNTER — Inpatient Hospital Stay: Payer: 59 | Attending: Hematology & Oncology

## 2023-02-01 ENCOUNTER — Inpatient Hospital Stay: Payer: 59

## 2023-02-01 ENCOUNTER — Encounter: Payer: Self-pay | Admitting: Hematology & Oncology

## 2023-02-01 VITALS — BP 108/51 | HR 68 | Temp 97.6°F | Resp 18 | Ht 64.0 in | Wt 134.0 lb

## 2023-02-01 VITALS — BP 108/51 | HR 68 | Temp 97.6°F | Resp 17

## 2023-02-01 DIAGNOSIS — A4150 Gram-negative sepsis, unspecified: Secondary | ICD-10-CM | POA: Diagnosis not present

## 2023-02-01 DIAGNOSIS — Z7901 Long term (current) use of anticoagulants: Secondary | ICD-10-CM | POA: Diagnosis not present

## 2023-02-01 DIAGNOSIS — E1122 Type 2 diabetes mellitus with diabetic chronic kidney disease: Secondary | ICD-10-CM | POA: Diagnosis not present

## 2023-02-01 DIAGNOSIS — I129 Hypertensive chronic kidney disease with stage 1 through stage 4 chronic kidney disease, or unspecified chronic kidney disease: Secondary | ICD-10-CM | POA: Diagnosis not present

## 2023-02-01 DIAGNOSIS — N390 Urinary tract infection, site not specified: Secondary | ICD-10-CM | POA: Diagnosis not present

## 2023-02-01 DIAGNOSIS — C541 Malignant neoplasm of endometrium: Secondary | ICD-10-CM | POA: Diagnosis not present

## 2023-02-01 DIAGNOSIS — C530 Malignant neoplasm of endocervix: Secondary | ICD-10-CM

## 2023-02-01 DIAGNOSIS — D638 Anemia in other chronic diseases classified elsewhere: Secondary | ICD-10-CM | POA: Diagnosis not present

## 2023-02-01 DIAGNOSIS — N1831 Chronic kidney disease, stage 3a: Secondary | ICD-10-CM | POA: Diagnosis not present

## 2023-02-01 DIAGNOSIS — I82501 Chronic embolism and thrombosis of unspecified deep veins of right lower extremity: Secondary | ICD-10-CM | POA: Diagnosis not present

## 2023-02-01 DIAGNOSIS — N12 Tubulo-interstitial nephritis, not specified as acute or chronic: Secondary | ICD-10-CM | POA: Diagnosis not present

## 2023-02-01 DIAGNOSIS — N189 Chronic kidney disease, unspecified: Secondary | ICD-10-CM | POA: Diagnosis not present

## 2023-02-01 DIAGNOSIS — D509 Iron deficiency anemia, unspecified: Secondary | ICD-10-CM

## 2023-02-01 DIAGNOSIS — T83512D Infection and inflammatory reaction due to nephrostomy catheter, subsequent encounter: Secondary | ICD-10-CM | POA: Diagnosis not present

## 2023-02-01 LAB — SAMPLE TO BLOOD BANK

## 2023-02-01 LAB — CMP (CANCER CENTER ONLY)
ALT: 5 U/L (ref 0–44)
AST: 9 U/L — ABNORMAL LOW (ref 15–41)
Albumin: 3.8 g/dL (ref 3.5–5.0)
Alkaline Phosphatase: 91 U/L (ref 38–126)
Anion gap: 12 (ref 5–15)
BUN: 108 mg/dL — ABNORMAL HIGH (ref 6–20)
CO2: 19 mmol/L — ABNORMAL LOW (ref 22–32)
Calcium: 9.9 mg/dL (ref 8.9–10.3)
Chloride: 106 mmol/L (ref 98–111)
Creatinine: 3.74 mg/dL — ABNORMAL HIGH (ref 0.44–1.00)
GFR, Estimated: 13 mL/min — ABNORMAL LOW (ref >60)
Glucose, Bld: 169 mg/dL — ABNORMAL HIGH (ref 70–99)
Potassium: 5 mmol/L (ref 3.5–5.1)
Sodium: 137 mmol/L (ref 135–145)
Total Bilirubin: 0.4 mg/dL (ref 0.3–1.2)
Total Protein: 7.9 g/dL (ref 6.5–8.1)

## 2023-02-01 LAB — CBC WITH DIFFERENTIAL (CANCER CENTER ONLY)
Abs Immature Granulocytes: 0.02 10*3/uL (ref 0.00–0.07)
Basophils Absolute: 0 10*3/uL (ref 0.0–0.1)
Basophils Relative: 1 %
Eosinophils Absolute: 0.3 10*3/uL (ref 0.0–0.5)
Eosinophils Relative: 7 %
HCT: 28.3 % — ABNORMAL LOW (ref 36.0–46.0)
Hemoglobin: 9 g/dL — ABNORMAL LOW (ref 12.0–15.0)
Immature Granulocytes: 1 %
Lymphocytes Relative: 24 %
Lymphs Abs: 1 10*3/uL (ref 0.7–4.0)
MCH: 30.6 pg (ref 26.0–34.0)
MCHC: 31.8 g/dL (ref 30.0–36.0)
MCV: 96.3 fL (ref 80.0–100.0)
Monocytes Absolute: 0.4 10*3/uL (ref 0.1–1.0)
Monocytes Relative: 10 %
Neutro Abs: 2.6 10*3/uL (ref 1.7–7.7)
Neutrophils Relative %: 57 %
Platelet Count: 211 10*3/uL (ref 150–400)
RBC: 2.94 MIL/uL — ABNORMAL LOW (ref 3.87–5.11)
RDW: 15.9 % — ABNORMAL HIGH (ref 11.5–15.5)
WBC Count: 4.4 10*3/uL (ref 4.0–10.5)
nRBC: 0 % (ref 0.0–0.2)

## 2023-02-01 LAB — PROTIME-INR
INR: 1.4 — ABNORMAL HIGH (ref 0.8–1.2)
Prothrombin Time: 17.2 seconds — ABNORMAL HIGH (ref 11.4–15.2)

## 2023-02-01 LAB — LACTATE DEHYDROGENASE: LDH: 132 U/L (ref 98–192)

## 2023-02-01 LAB — PREALBUMIN: Prealbumin: 22 mg/dL (ref 18–38)

## 2023-02-01 MED ORDER — SODIUM CHLORIDE 0.9 % IV SOLN: 125.0000 mg | Freq: Once | INTRAVENOUS | Status: DC

## 2023-02-01 MED ORDER — SODIUM CHLORIDE 0.9 % IV SOLN: Freq: Once | INTRAVENOUS | Status: DC

## 2023-02-01 NOTE — Progress Notes (Signed)
Hematology and Oncology Follow Up Visit  Semiah Konczal [id number] 1965/09/27 57 y.o. 02/01/2023   Principle Diagnosis:  Metastatic adenocarcinoma of the endometrium -- No Actionable mutations Chronic renal failure Recurrent thromboembolic disease   Current Therapy:        Afinitor/letrozole - DC'd Blood transfusion as needed Coumadin PO daily adjusted to maintain INR 2.5-3.5   Interim History:  Ms. Eckersley is her today with her sister for follow-up.  This is probably the best that I have seen her look.  She is not have any swelling in her legs.  However, she said that the Nephrologist was put her on dialysis.  I am not sure if this really would be a good idea for her.  I am unsure how well she would tolerate dialysis.  She is still urinating.  I think the problem is that her BUN is going up they are quite high.  Today the BUN is 108.  I do not see any evidence of uremia.  She is going to think about dialysis.  Again, I am unsure if she would really benefit from dialysis given the "bigger picture" with respect to her malignancy.  She is on Coumadin for her thromboembolic disease.  Today, her INR is 1.4.  We to make a Coumadin adjustment.  She has been followed by urology.  They will try to do the best to discontinue her nephrostomy tubes.  1 tube is gone.  She will have surgery next week to see about have the other 1 removed.  This will certainly make her quite happy.  She is having no problems with her bowels.  Tawanna Cooler is doing well.  She is having no nausea or vomiting.  She is having no cough or shortness of breath.  She has had no fever.  Her CA125 was 282 back in May.  I had ordered a PET scan for back in April.  I am unsure why this has not yet been done.  It really would be nice to get a PET scan on her so we can see where everything stands.  So far, I am not seeing any problems with respect to her and congestive heart failure.  Her last echocardiogram was done on 12/11/2022.  This  showed an ejection fraction of 35 to 40%.  Currently, I would say that her performance status right now is probably ECOG 1.   Medications:  Allergies as of 02/01/2023       Reactions   Taxotere [docetaxel] Swelling   Throat swelling        Medication List        Accurate as of February 01, 2023  3:28 PM. If you have any questions, ask your nurse or doctor.          STOP taking these medications    Arixtra 7.5 MG/0.6ML Soln injection Generic drug: fondaparinux Stopped by: [name]       TAKE these medications    acetaminophen 500 MG tablet Commonly known as: TYLENOL Take 500 mg by mouth every 6 (six) hours as needed for mild pain.   carvedilol 12.5 MG tablet Commonly known as: Coreg Take 1 tablet (12.5 mg total) by mouth 2 (two) times daily with a meal.   FreeStyle Libre 3 Sensor Misc 1 Act by Does not apply route daily. Place 1 sensor on the skin every 14 days. Use to check glucose continuously   furosemide 20 MG tablet Commonly known as: LASIX TAKE 1 TABLET BY MOUTH EVERY  DAY   gabapentin 300 MG capsule Commonly known as: NEURONTIN Take 1 capsule (300 mg total) by mouth 3 (three) times daily.   insulin detemir 100 UNIT/ML injection Commonly known as: LEVEMIR Inject 0.2 mLs (20 Units total) into the skin 2 (two) times daily.   NovoLOG FlexPen 100 UNIT/ML FlexPen Generic drug: insulin aspart Inject 2 Units into the skin 3 times daily with meals.   oxybutynin 10 MG 24 hr tablet Commonly known as: DITROPAN-XL Take 10 mg by mouth daily.   oxycodone 5 MG capsule Commonly known as: OXY-IR Take 1 capsule (5 mg total) by mouth every 4 (four) hours as needed for pain.   polyethylene glycol 17 g packet Commonly known as: MIRALAX / GLYCOLAX Take 17 g by mouth daily as needed for mild constipation.   prochlorperazine 10 MG tablet Commonly known as: COMPAZINE Take 10 mg by mouth every 6 (six) hours as needed for nausea or vomiting.   venlafaxine XR  37.5 MG 24 hr capsule Commonly known as: Effexor XR Take 1 capsule (37.5 mg total) by mouth daily with breakfast.   warfarin 5 MG tablet Commonly known as: COUMADIN Take 1 tablet (5 mg) by mouth daily.        Allergies:  Allergies  Allergen Reactions   Taxotere [Docetaxel] Swelling    Throat swelling    Past Medical History, Surgical history, Social history, and Family History were reviewed and updated.  Review of Systems: Review of Systems  Constitutional: Negative.   HENT: Negative.    Eyes: Negative.   Respiratory: Negative.    Cardiovascular: Negative.   Gastrointestinal: Negative.   Genitourinary:  Positive for flank pain.  Musculoskeletal:  Positive for back pain.  Skin: Negative.   Neurological: Negative.   Endo/Heme/Allergies: Negative.   Psychiatric/Behavioral: Negative.     Marland Kitchen   Physical Exam:  height is 5\' 4"  (1.626 m) and weight is 134 lb (60.8 kg). Her oral temperature is 97.6 F (36.4 C). Her blood pressure is 108/51 (abnormal) and her pulse is 68. Her respiration is 18 and oxygen saturation is 97%.   Wt Readings from Last 3 Encounters:  02/01/23 134 lb (60.8 kg)  01/12/23 136 lb 12.8 oz (62.1 kg)  01/09/23 140 lb (63.5 kg)    Physical Exam Vitals reviewed.  HENT:     Head: Normocephalic and atraumatic.  Eyes:     Pupils: Pupils are equal, round, and reactive to light.  Cardiovascular:     Rate and Rhythm: Normal rate and regular rhythm.     Heart sounds: Normal heart sounds.  Pulmonary:     Effort: Pulmonary effort is normal.     Breath sounds: Normal breath sounds.  Abdominal:     General: Bowel sounds are normal.     Palpations: Abdomen is soft.  Musculoskeletal:        General: No tenderness or deformity. Normal range of motion.     Cervical back: Normal range of motion.  Lymphadenopathy:     Cervical: No cervical adenopathy.  Skin:    General: Skin is warm and dry.     Findings: No erythema or rash.  Neurological:     Mental  Status: She is alert and oriented to person, place, and time.  Psychiatric:        Behavior: Behavior normal.        Thought Content: Thought content normal.        Judgment: Judgment normal.      Lab Results  Component Value Date   WBC 4.4 02/01/2023   HGB 9.0 (L) 02/01/2023   HCT 28.3 (L) 02/01/2023   MCV 96.3 02/01/2023   PLT 211 02/01/2023   Lab Results  Component Value Date   FERRITIN 1,812 (H) 11/16/2022   IRON 28 11/16/2022   TIBC 211 (L) 11/16/2022   UIBC 183 11/16/2022   IRONPCTSAT 13 11/16/2022   Lab Results  Component Value Date   RETICCTPCT 0.7 10/11/2022   RBC 2.94 (L) 02/01/2023   No results found for: "KPAFRELGTCHN", "LAMBDASER", "KAPLAMBRATIO" No results found for: "IGGSERUM", "IGA", "IGMSERUM" No results found for: "TOTALPROTELP", "ALBUMINELP", "A1GS", "A2GS", "BETS", "BETA2SER", "GAMS", "MSPIKE", "SPEI"   Chemistry      Component Value Date/Time   NA 135 01/09/2023 0800   NA 143 01/01/2023 1236   K 5.3 (H) 01/09/2023 0800   CL 109 01/09/2023 0800   CO2 18 (L) 01/09/2023 0800   BUN 84 (H) 01/09/2023 0800   BUN 57 (H) 01/01/2023 1236   CREATININE 3.37 (H) 01/09/2023 0800   CREATININE 3.53 (H) 12/15/2022 1419      Component Value Date/Time   CALCIUM 9.3 01/09/2023 0800   ALKPHOS 91 01/02/2023 1142   AST 10 (L) 01/02/2023 1142   AST 11 (L) 12/15/2022 1419   ALT 9 01/02/2023 1142   ALT <5 12/15/2022 1419   BILITOT 0.4 01/02/2023 1142   BILITOT 0.5 12/15/2022 1419       Impression and Plan: Mr. Lavis is a 57 yo caucasian female with metastatic endometrial cancer.  She has been on quite a few treatments.  Again, given the fact that her cardiac status is not that great, we will going to have to be very cautious as far as starting her on treatment.  Again, I really would like to get a PET scan on her.  Again I am unsure as to why the one that I ordered back in May has not been done.  Maybe, is some to do with insurance.  Her INR is too low.  I  will have to see how much Coumadin she is taking right now.  She may be off Coumadin right now until she has her urology procedure.  I would like to have her come back to see [place] in another 3 weeks or so.  Again, we will going to have to stay close with her so that we can see how she feels.  I am quite happy that her quality of life is doing better right now.   [name], MD 7/11/20243:28 PM

## 2023-02-02 DIAGNOSIS — D638 Anemia in other chronic diseases classified elsewhere: Secondary | ICD-10-CM | POA: Diagnosis not present

## 2023-02-02 DIAGNOSIS — N390 Urinary tract infection, site not specified: Secondary | ICD-10-CM | POA: Diagnosis not present

## 2023-02-02 DIAGNOSIS — E1122 Type 2 diabetes mellitus with diabetic chronic kidney disease: Secondary | ICD-10-CM | POA: Diagnosis not present

## 2023-02-02 DIAGNOSIS — T83512D Infection and inflammatory reaction due to nephrostomy catheter, subsequent encounter: Secondary | ICD-10-CM | POA: Diagnosis not present

## 2023-02-02 DIAGNOSIS — N12 Tubulo-interstitial nephritis, not specified as acute or chronic: Secondary | ICD-10-CM | POA: Diagnosis not present

## 2023-02-02 DIAGNOSIS — I129 Hypertensive chronic kidney disease with stage 1 through stage 4 chronic kidney disease, or unspecified chronic kidney disease: Secondary | ICD-10-CM | POA: Diagnosis not present

## 2023-02-02 DIAGNOSIS — I82501 Chronic embolism and thrombosis of unspecified deep veins of right lower extremity: Secondary | ICD-10-CM | POA: Diagnosis not present

## 2023-02-02 DIAGNOSIS — A4150 Gram-negative sepsis, unspecified: Secondary | ICD-10-CM | POA: Diagnosis not present

## 2023-02-02 DIAGNOSIS — C541 Malignant neoplasm of endometrium: Secondary | ICD-10-CM | POA: Diagnosis not present

## 2023-02-02 DIAGNOSIS — N1831 Chronic kidney disease, stage 3a: Secondary | ICD-10-CM | POA: Diagnosis not present

## 2023-02-05 ENCOUNTER — Other Ambulatory Visit: Payer: Self-pay | Admitting: Radiology

## 2023-02-05 DIAGNOSIS — N135 Crossing vessel and stricture of ureter without hydronephrosis: Secondary | ICD-10-CM

## 2023-02-05 NOTE — H&P (Incomplete)
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, exchange of left nephrostomy on 11/29/2022,  Port-A-Cath removal secondary to bacteremia on 12/04/2022 and new Port-A-Cath placement along with nephroureterogram, left distal ureteroplasty and conversion of left nephrostomy to left nephroureteral catheter on 01/09/2023 followed by capping of catheter; she is a 57 year old female with history of metastatic endometrial carcinoma with bilateral obstructive uropathy post right-sided ureteral stent placement with failed attempt at left-sided stent placement; 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  Prior nephrostogram on 11/29/2022 revealed patency of the left ureter to the level of the urinary bladder but there was uncertainty whether contrast passed into the urinary bladder or if there was persistent fistulous connection between the distal end of the bladder and the vagina.  Latest ureterogram on 6/18 revealed severe distal left ureteral stricture.  Patient underwent successful distal ureteroplasty and placement of a 10 French 22 cm nephroureteral catheter which was capped.  She returns today to assess tolerance of capping and for potential internal double-J nephroureteral stent placement.     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 failure 08/18/2021   Diabetes mellitus without  complication (HCC) type 2    Endometrial cancer, FIGO stage IVB (HCC) 08/18/2021   Goals of care, counseling/discussion 08/18/2021   History of kidney stones    Hypertension    Leg DVT (deep venous thromboembolism), chronic, right (HCC) 08/18/2021   Ovarian cancer (HCC) 2013   Pneumonia 12/03/2022   Wears glasses    Past Surgical History:  Procedure Laterality Date   ABDOMINAL HYSTERECTOMY     BOTOX INJECTION N/A 01/12/2023   Procedure: BOTOX INJECTION INTO BLADDER;  Surgeon: Jannifer Hick, MD;  Location: [facility];  Service: Urology;  Laterality: N/A;   CYSTOSCOPY W/ RETROGRADES Right 01/12/2023   Procedure: CYSTOSCOPY WITH RIGHT  RETROGRADE PYELOGRAM/ RIGHT URETERAL STENT CHANGE;  Surgeon: Jannifer Hick, MD;  Location: [facility];  Service: Urology;  Laterality: Right;   CYSTOSCOPY W/ URETERAL STENT PLACEMENT Bilateral 09/01/2021   Procedure: CYSTOSCOPY WITH RETROGRADE PYELOGRAM/URETERAL STENT PLACEMENT;  Surgeon: Jannifer Hick, MD;  Location: [facility];  Service: Urology;  Laterality: Bilateral;   CYSTOSCOPY WITH FULGERATION N/A 01/12/2023   Procedure: CYSTOSCOPY WITH FULGERATION;  Surgeon: Jannifer Hick, MD;  Location: [facility];  Service: Urology;  Laterality: N/A;   CYSTOSCOPY WITH STENT PLACEMENT Right 05/19/2022   Procedure: CYSTOSCOPY WITH STENT CHANGE;  Surgeon: Jannifer Hick, MD;  Location: [facility];  Service: Urology;  Laterality: Right;  ONLY NEEDS 30 MIN   HERNIA REPAIR     IR CONVERT LEFT NEPHROSTOMY TO NEPHROURETERAL CATH  11/02/2021   IR CONVERT LEFT NEPHROSTOMY TO NEPHROURETERAL CATH  01/09/2023   IR CV LINE INJECTION  08/23/2021   IR DIL URETER LEFT  01/09/2023   IR IMAGING GUIDED PORT INSERTION  11/14/2021   IR IMAGING GUIDED PORT INSERTION  01/09/2023   IR NEPHROSTOMY EXCHANGE LEFT  11/02/2021   IR NEPHROSTOMY EXCHANGE LEFT  01/20/2022   IR NEPHROSTOMY EXCHANGE LEFT  11/29/2022   IR NEPHROSTOMY PLACEMENT LEFT  09/03/2021   IR  REMOVAL TUN ACCESS W/ PORT W/O FL MOD SED  11/14/2021   IR REMOVAL TUN ACCESS W/ PORT W/O FL MOD SED  12/04/2022      Allergies: Taxotere [docetaxel]  Medications: Prior to Admission medications   Medication Sig Start Date End Date Taking? Authorizing Provider  acetaminophen (TYLENOL) 500 MG tablet Take 500 mg by mouth every 6 (six) hours as needed for mild pain.    [provider]  carvedilol (COREG) 12.5 MG tablet Take 1 tablet (12.5 mg total) by mouth 2 (two) times daily with a meal. 08/30/22   Ennever, Rose Phi, MD  Continuous Glucose Sensor (FREESTYLE LIBRE 3 SENSOR) MISC 1 Act by Does not apply route daily. Place 1 sensor on the skin every 14 days. Use to check glucose continuously 12/26/22   Karie Georges, MD  furosemide (LASIX) 20 MG tablet TAKE 1 TABLET BY MOUTH EVERY DAY 01/22/23   Karie Georges, MD  gabapentin (NEURONTIN) 300 MG capsule Take 1 capsule (300 mg total) by mouth 3 (three) times daily. 12/15/22   Erenest Blank, NP  insulin aspart (NOVOLOG) 100 UNIT/ML FlexPen Inject 2 Units into the skin 3 times daily with meals. 12/12/22   Marinda Elk, MD  insulin detemir (LEVEMIR) 100 UNIT/ML injection Inject 0.2 mLs (20 Units total) into the skin 2 (two) times daily. 08/30/22   [name], MD  oxybutynin (DITROPAN-XL) 10 MG 24 hr tablet Take 10 mg by mouth daily. 08/27/22   [provider]  oxycodone (OXY-IR) 5 MG capsule Take 1 capsule (5 mg total) by mouth every 4 (four) hours as needed for pain. 12/13/22   [name], MD  polyethylene glycol (MIRALAX / GLYCOLAX) 17 g packet Take 17 g by mouth daily as needed for mild constipation. 09/05/21   Pokhrel, Rebekah Chesterfield, MD  prochlorperazine (COMPAZINE) 10 MG tablet Take 10 mg by mouth every 6 (six) hours as needed for nausea or vomiting. 08/18/21   [provider]  venlafaxine XR (EFFEXOR XR) 37.5 MG 24 hr capsule Take 1 capsule (37.5 mg total) by mouth daily with breakfast. 12/26/22   Karie Georges, MD  warfarin (COUMADIN) 5 MG tablet Take 1 tablet (5 mg) by mouth daily. 01/24/23   Karie Georges, MD     Vital Signs:    Code Status:   Physical Exam  Imaging: No results found.  Labs:  CBC: Recent Labs    12/29/22 1345 01/02/23 1142 01/09/23 0800 02/01/23 1446  WBC 6.1 4.8 5.5 4.4  HGB 8.1* 8.7* 8.5* 9.0*  HCT 26.8* 28.1* 27.0* 28.3*  PLT 206 211 243 211    COAGS: Recent Labs    11/28/22 0844 11/30/22 1204 12/09/22 0359 01/02/23 1142 01/09/23 0800 01/12/23 1104 02/01/23 1446  INR 1.2 1.1   < > 2.7* 1.5* 1.3* 1.4*  APTT 49* 41*  --  61*  --   --   --    < > = values in this interval not displayed.    BMP: Recent Labs    12/15/22 1419 01/01/23 1236 01/02/23 1142 01/09/23 0800 02/01/23 1446  NA 140 143 135 135 137  K 5.3* 6.0* 4.5 5.3* 5.0  CL 107 107* 105 109 106  CO2 23 17* 19* 18* 19*  GLUCOSE 135* 165* 115* 117* 169*  BUN  50* 57* 72* 84* 108*  CALCIUM 9.0 9.3 8.9 9.3 9.9  CREATININE 3.53* 3.14* 3.32* 3.37* 3.74*  GFRNONAA 14*  --  15* 15* 13*    LIVER FUNCTION TESTS: Recent Labs    12/12/22 0418 12/15/22 1419 01/02/23 1142 02/01/23 1446  BILITOT 0.6 0.5 0.4 0.4  AST 12* 11* 10* 9*  ALT 5 5 9 5   ALKPHOS 75 83 91 91  PROT 6.1* 6.8 7.4 7.9  ALBUMIN 2.2* 3.4* 3.1* 3.8    Assessment and Plan: 57 year old female with history of metastatic endometrial carcinoma with bilateral obstructive uropathy status post prior right-sided ureteral stent placement with failed attempt at left-sided stent placement; 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  Prior nephrostogram on 11/29/2022 revealed patency of the left ureter to the level of the urinary bladder but there was uncertainty whether contrast passed into the urinary bladder or if there was persistent fistulous connection between the distal end of the bladder and the vagina.  Latest ureterogram on 6/18 revealed severe distal left ureteral  stricture.  Patient subsequently underwent successful left distal ureteroplasty and placement of a 10 French 22 cm nephroureteral catheter which was capped.  She returns today to assess tolerance of capping and for potential internal double-J nephroureteral stent placement.   Electronically Signed: D. Jeananne Rama, PA-C 02/05/2023, 1:44 PM   I spent a total of 20 minutes at the the patient's bedside AND on the patient's hospital floor or unit, greater than 50% of which was counseling/coordinating care for left nephrostogram with possible internal double-J nephroureteral stent placement

## 2023-02-06 ENCOUNTER — Ambulatory Visit (HOSPITAL_COMMUNITY): Payer: 59

## 2023-02-06 ENCOUNTER — Telehealth: Payer: Self-pay | Admitting: Dietician

## 2023-02-06 ENCOUNTER — Inpatient Hospital Stay (HOSPITAL_COMMUNITY)
Admission: RE | Admit: 2023-02-06 | Discharge: 2023-02-06 | Disposition: A | Discharge status: Home / Self Care | Payer: 59 | Source: Ambulatory Visit | Attending: Interventional Radiology | Admitting: Interventional Radiology

## 2023-02-06 NOTE — Telephone Encounter (Signed)
 Patient screened on MST. First attempt to reach. Provided my cell# on voice mail to return call to set up a nutrition consult. ° °Cyndi Dinger, RDN, LDN °Registered Dietitian, [facility] °Part Time Remote (Usual office hours: Tuesday-Thursday) °Cell: [phone]   °

## 2023-02-07 ENCOUNTER — Encounter (HOSPITAL_COMMUNITY)
Admission: RE | Admit: 2023-02-07 | Discharge: 2023-02-07 | Disposition: A | Discharge status: Home / Self Care | Payer: 59 | Source: Ambulatory Visit | Attending: Hematology & Oncology | Admitting: Hematology & Oncology

## 2023-02-07 DIAGNOSIS — R791 Abnormal coagulation profile: Secondary | ICD-10-CM | POA: Diagnosis not present

## 2023-02-07 DIAGNOSIS — Z8542 Personal history of malignant neoplasm of other parts of uterus: Secondary | ICD-10-CM | POA: Diagnosis not present

## 2023-02-07 DIAGNOSIS — Z8543 Personal history of malignant neoplasm of ovary: Secondary | ICD-10-CM | POA: Diagnosis not present

## 2023-02-07 DIAGNOSIS — Z794 Long term (current) use of insulin: Secondary | ICD-10-CM | POA: Diagnosis not present

## 2023-02-07 DIAGNOSIS — C7951 Secondary malignant neoplasm of bone: Secondary | ICD-10-CM | POA: Diagnosis not present

## 2023-02-07 DIAGNOSIS — K5900 Constipation, unspecified: Secondary | ICD-10-CM | POA: Diagnosis not present

## 2023-02-07 DIAGNOSIS — R079 Chest pain, unspecified: Secondary | ICD-10-CM | POA: Diagnosis not present

## 2023-02-07 DIAGNOSIS — E872 Acidosis, unspecified: Secondary | ICD-10-CM | POA: Diagnosis not present

## 2023-02-07 DIAGNOSIS — I82432 Acute embolism and thrombosis of left popliteal vein: Secondary | ICD-10-CM | POA: Diagnosis not present

## 2023-02-07 DIAGNOSIS — I824Y9 Acute embolism and thrombosis of unspecified deep veins of unspecified proximal lower extremity: Secondary | ICD-10-CM | POA: Diagnosis not present

## 2023-02-07 DIAGNOSIS — N390 Urinary tract infection, site not specified: Secondary | ICD-10-CM | POA: Diagnosis not present

## 2023-02-07 DIAGNOSIS — I824Y3 Acute embolism and thrombosis of unspecified deep veins of proximal lower extremity, bilateral: Secondary | ICD-10-CM | POA: Diagnosis not present

## 2023-02-07 DIAGNOSIS — I129 Hypertensive chronic kidney disease with stage 1 through stage 4 chronic kidney disease, or unspecified chronic kidney disease: Secondary | ICD-10-CM | POA: Diagnosis not present

## 2023-02-07 DIAGNOSIS — Z7901 Long term (current) use of anticoagulants: Secondary | ICD-10-CM | POA: Diagnosis not present

## 2023-02-07 DIAGNOSIS — N184 Chronic kidney disease, stage 4 (severe): Secondary | ICD-10-CM | POA: Diagnosis not present

## 2023-02-07 DIAGNOSIS — R8290 Unspecified abnormal findings in urine: Secondary | ICD-10-CM | POA: Diagnosis not present

## 2023-02-07 DIAGNOSIS — K8020 Calculus of gallbladder without cholecystitis without obstruction: Secondary | ICD-10-CM | POA: Diagnosis not present

## 2023-02-07 DIAGNOSIS — J189 Pneumonia, unspecified organism: Secondary | ICD-10-CM | POA: Diagnosis not present

## 2023-02-07 DIAGNOSIS — Z79899 Other long term (current) drug therapy: Secondary | ICD-10-CM | POA: Diagnosis not present

## 2023-02-07 DIAGNOSIS — N250 Renal osteodystrophy: Secondary | ICD-10-CM | POA: Diagnosis not present

## 2023-02-07 DIAGNOSIS — R1032 Left lower quadrant pain: Secondary | ICD-10-CM | POA: Diagnosis not present

## 2023-02-07 DIAGNOSIS — I82401 Acute embolism and thrombosis of unspecified deep veins of right lower extremity: Secondary | ICD-10-CM | POA: Diagnosis not present

## 2023-02-07 DIAGNOSIS — C541 Malignant neoplasm of endometrium: Secondary | ICD-10-CM

## 2023-02-07 DIAGNOSIS — N185 Chronic kidney disease, stage 5: Secondary | ICD-10-CM | POA: Diagnosis not present

## 2023-02-07 DIAGNOSIS — I82412 Acute embolism and thrombosis of left femoral vein: Secondary | ICD-10-CM | POA: Diagnosis not present

## 2023-02-07 DIAGNOSIS — Z86718 Personal history of other venous thrombosis and embolism: Secondary | ICD-10-CM | POA: Diagnosis not present

## 2023-02-07 DIAGNOSIS — N179 Acute kidney failure, unspecified: Secondary | ICD-10-CM | POA: Diagnosis not present

## 2023-02-07 DIAGNOSIS — Z888 Allergy status to other drugs, medicaments and biological substances status: Secondary | ICD-10-CM | POA: Diagnosis not present

## 2023-02-07 DIAGNOSIS — I429 Cardiomyopathy, unspecified: Secondary | ICD-10-CM | POA: Diagnosis not present

## 2023-02-07 DIAGNOSIS — I700 Atherosclerosis of aorta: Secondary | ICD-10-CM | POA: Diagnosis not present

## 2023-02-07 DIAGNOSIS — D630 Anemia in neoplastic disease: Secondary | ICD-10-CM | POA: Diagnosis not present

## 2023-02-07 DIAGNOSIS — I2699 Other pulmonary embolism without acute cor pulmonale: Secondary | ICD-10-CM | POA: Diagnosis not present

## 2023-02-07 DIAGNOSIS — R2241 Localized swelling, mass and lump, right lower limb: Secondary | ICD-10-CM | POA: Diagnosis not present

## 2023-02-07 DIAGNOSIS — Z66 Do not resuscitate: Secondary | ICD-10-CM | POA: Diagnosis not present

## 2023-02-07 DIAGNOSIS — R2240 Localized swelling, mass and lump, unspecified lower limb: Secondary | ICD-10-CM | POA: Diagnosis not present

## 2023-02-07 DIAGNOSIS — I82431 Acute embolism and thrombosis of right popliteal vein: Secondary | ICD-10-CM | POA: Diagnosis not present

## 2023-02-07 DIAGNOSIS — R600 Localized edema: Secondary | ICD-10-CM | POA: Diagnosis not present

## 2023-02-07 DIAGNOSIS — D631 Anemia in chronic kidney disease: Secondary | ICD-10-CM | POA: Diagnosis not present

## 2023-02-07 DIAGNOSIS — G893 Neoplasm related pain (acute) (chronic): Secondary | ICD-10-CM | POA: Diagnosis not present

## 2023-02-07 DIAGNOSIS — C7982 Secondary malignant neoplasm of genital organs: Secondary | ICD-10-CM | POA: Diagnosis not present

## 2023-02-07 DIAGNOSIS — E1110 Type 2 diabetes mellitus with ketoacidosis without coma: Secondary | ICD-10-CM | POA: Diagnosis not present

## 2023-02-07 DIAGNOSIS — I428 Other cardiomyopathies: Secondary | ICD-10-CM | POA: Diagnosis not present

## 2023-02-07 DIAGNOSIS — I82411 Acute embolism and thrombosis of right femoral vein: Secondary | ICD-10-CM | POA: Diagnosis not present

## 2023-02-07 DIAGNOSIS — Z8673 Personal history of transient ischemic attack (TIA), and cerebral infarction without residual deficits: Secondary | ICD-10-CM | POA: Diagnosis not present

## 2023-02-07 DIAGNOSIS — R112 Nausea with vomiting, unspecified: Secondary | ICD-10-CM | POA: Diagnosis not present

## 2023-02-07 DIAGNOSIS — Z515 Encounter for palliative care: Secondary | ICD-10-CM | POA: Diagnosis not present

## 2023-02-07 DIAGNOSIS — R918 Other nonspecific abnormal finding of lung field: Secondary | ICD-10-CM | POA: Diagnosis not present

## 2023-02-07 DIAGNOSIS — R0789 Other chest pain: Secondary | ICD-10-CM | POA: Diagnosis present

## 2023-02-07 DIAGNOSIS — F17210 Nicotine dependence, cigarettes, uncomplicated: Secondary | ICD-10-CM | POA: Diagnosis not present

## 2023-02-07 DIAGNOSIS — E1122 Type 2 diabetes mellitus with diabetic chronic kidney disease: Secondary | ICD-10-CM | POA: Diagnosis not present

## 2023-02-07 DIAGNOSIS — R109 Unspecified abdominal pain: Secondary | ICD-10-CM | POA: Diagnosis not present

## 2023-02-07 DIAGNOSIS — N186 End stage renal disease: Secondary | ICD-10-CM | POA: Diagnosis not present

## 2023-02-07 DIAGNOSIS — Z7189 Other specified counseling: Secondary | ICD-10-CM | POA: Diagnosis not present

## 2023-02-07 DIAGNOSIS — I120 Hypertensive chronic kidney disease with stage 5 chronic kidney disease or end stage renal disease: Secondary | ICD-10-CM | POA: Diagnosis not present

## 2023-02-07 LAB — GLUCOSE, CAPILLARY: Glucose-Capillary: 123 mg/dL — ABNORMAL HIGH (ref 70–99)

## 2023-02-07 MED ORDER — FLUDEOXYGLUCOSE F - 18 (FDG) INJECTION
6.8000 | Freq: Once | INTRAVENOUS | Status: AC
  Administered 2023-02-07: 6.8 via INTRAVENOUS

## 2023-02-08 ENCOUNTER — Emergency Department (HOSPITAL_BASED_OUTPATIENT_CLINIC_OR_DEPARTMENT_OTHER): Payer: 59

## 2023-02-08 ENCOUNTER — Other Ambulatory Visit: Payer: Self-pay

## 2023-02-08 ENCOUNTER — Encounter (HOSPITAL_BASED_OUTPATIENT_CLINIC_OR_DEPARTMENT_OTHER): Payer: Self-pay

## 2023-02-08 ENCOUNTER — Observation Stay (HOSPITAL_BASED_OUTPATIENT_CLINIC_OR_DEPARTMENT_OTHER)
Admission: EM | Admit: 2023-02-08 | Discharge: 2023-02-10 | Disposition: A | Discharge status: Home / Self Care | Payer: 59 | Attending: Emergency Medicine | Admitting: Emergency Medicine

## 2023-02-08 DIAGNOSIS — Z794 Long term (current) use of insulin: Secondary | ICD-10-CM | POA: Diagnosis not present

## 2023-02-08 DIAGNOSIS — E1110 Type 2 diabetes mellitus with ketoacidosis without coma: Secondary | ICD-10-CM | POA: Insufficient documentation

## 2023-02-08 DIAGNOSIS — N390 Urinary tract infection, site not specified: Secondary | ICD-10-CM | POA: Diagnosis not present

## 2023-02-08 DIAGNOSIS — F17210 Nicotine dependence, cigarettes, uncomplicated: Secondary | ICD-10-CM | POA: Diagnosis not present

## 2023-02-08 DIAGNOSIS — N185 Chronic kidney disease, stage 5: Secondary | ICD-10-CM | POA: Diagnosis present

## 2023-02-08 DIAGNOSIS — D630 Anemia in neoplastic disease: Secondary | ICD-10-CM | POA: Diagnosis not present

## 2023-02-08 DIAGNOSIS — Z8673 Personal history of transient ischemic attack (TIA), and cerebral infarction without residual deficits: Secondary | ICD-10-CM

## 2023-02-08 DIAGNOSIS — R079 Chest pain, unspecified: Secondary | ICD-10-CM | POA: Insufficient documentation

## 2023-02-08 DIAGNOSIS — R4589 Other symptoms and signs involving emotional state: Secondary | ICD-10-CM

## 2023-02-08 DIAGNOSIS — R8290 Unspecified abnormal findings in urine: Secondary | ICD-10-CM | POA: Diagnosis not present

## 2023-02-08 DIAGNOSIS — I129 Hypertensive chronic kidney disease with stage 1 through stage 4 chronic kidney disease, or unspecified chronic kidney disease: Secondary | ICD-10-CM | POA: Diagnosis not present

## 2023-02-08 DIAGNOSIS — K5900 Constipation, unspecified: Secondary | ICD-10-CM

## 2023-02-08 DIAGNOSIS — Z7901 Long term (current) use of anticoagulants: Secondary | ICD-10-CM | POA: Insufficient documentation

## 2023-02-08 DIAGNOSIS — K8020 Calculus of gallbladder without cholecystitis without obstruction: Secondary | ICD-10-CM | POA: Diagnosis not present

## 2023-02-08 DIAGNOSIS — Z86718 Personal history of other venous thrombosis and embolism: Secondary | ICD-10-CM | POA: Insufficient documentation

## 2023-02-08 DIAGNOSIS — I82432 Acute embolism and thrombosis of left popliteal vein: Secondary | ICD-10-CM | POA: Diagnosis not present

## 2023-02-08 DIAGNOSIS — R109 Unspecified abdominal pain: Secondary | ICD-10-CM | POA: Diagnosis not present

## 2023-02-08 DIAGNOSIS — R2241 Localized swelling, mass and lump, right lower limb: Secondary | ICD-10-CM | POA: Diagnosis not present

## 2023-02-08 DIAGNOSIS — R112 Nausea with vomiting, unspecified: Secondary | ICD-10-CM | POA: Diagnosis not present

## 2023-02-08 DIAGNOSIS — Z888 Allergy status to other drugs, medicaments and biological substances status: Secondary | ICD-10-CM | POA: Diagnosis not present

## 2023-02-08 DIAGNOSIS — Z7189 Other specified counseling: Secondary | ICD-10-CM

## 2023-02-08 DIAGNOSIS — Z79899 Other long term (current) drug therapy: Secondary | ICD-10-CM

## 2023-02-08 DIAGNOSIS — Z515 Encounter for palliative care: Principal | ICD-10-CM

## 2023-02-08 DIAGNOSIS — R918 Other nonspecific abnormal finding of lung field: Secondary | ICD-10-CM | POA: Diagnosis present

## 2023-02-08 DIAGNOSIS — R1032 Left lower quadrant pain: Secondary | ICD-10-CM | POA: Diagnosis not present

## 2023-02-08 DIAGNOSIS — D500 Iron deficiency anemia secondary to blood loss (chronic): Secondary | ICD-10-CM

## 2023-02-08 DIAGNOSIS — N184 Chronic kidney disease, stage 4 (severe): Secondary | ICD-10-CM | POA: Diagnosis not present

## 2023-02-08 DIAGNOSIS — I82411 Acute embolism and thrombosis of right femoral vein: Principal | ICD-10-CM | POA: Insufficient documentation

## 2023-02-08 DIAGNOSIS — I429 Cardiomyopathy, unspecified: Secondary | ICD-10-CM | POA: Diagnosis present

## 2023-02-08 DIAGNOSIS — Z8543 Personal history of malignant neoplasm of ovary: Secondary | ICD-10-CM | POA: Insufficient documentation

## 2023-02-08 DIAGNOSIS — J189 Pneumonia, unspecified organism: Secondary | ICD-10-CM | POA: Diagnosis not present

## 2023-02-08 DIAGNOSIS — R2240 Localized swelling, mass and lump, unspecified lower limb: Secondary | ICD-10-CM

## 2023-02-08 DIAGNOSIS — C7982 Secondary malignant neoplasm of genital organs: Secondary | ICD-10-CM | POA: Insufficient documentation

## 2023-02-08 DIAGNOSIS — Z8542 Personal history of malignant neoplasm of other parts of uterus: Secondary | ICD-10-CM

## 2023-02-08 DIAGNOSIS — Z8249 Family history of ischemic heart disease and other diseases of the circulatory system: Secondary | ICD-10-CM

## 2023-02-08 DIAGNOSIS — R0789 Other chest pain: Secondary | ICD-10-CM | POA: Diagnosis not present

## 2023-02-08 DIAGNOSIS — I82412 Acute embolism and thrombosis of left femoral vein: Secondary | ICD-10-CM | POA: Diagnosis not present

## 2023-02-08 DIAGNOSIS — I700 Atherosclerosis of aorta: Secondary | ICD-10-CM | POA: Diagnosis present

## 2023-02-08 DIAGNOSIS — Z87442 Personal history of urinary calculi: Secondary | ICD-10-CM

## 2023-02-08 DIAGNOSIS — R791 Abnormal coagulation profile: Secondary | ICD-10-CM | POA: Diagnosis present

## 2023-02-08 DIAGNOSIS — D631 Anemia in chronic kidney disease: Secondary | ICD-10-CM | POA: Diagnosis not present

## 2023-02-08 DIAGNOSIS — E872 Acidosis, unspecified: Secondary | ICD-10-CM | POA: Diagnosis not present

## 2023-02-08 DIAGNOSIS — G893 Neoplasm related pain (acute) (chronic): Secondary | ICD-10-CM

## 2023-02-08 DIAGNOSIS — Z66 Do not resuscitate: Secondary | ICD-10-CM | POA: Diagnosis not present

## 2023-02-08 DIAGNOSIS — I10 Essential (primary) hypertension: Secondary | ICD-10-CM | POA: Diagnosis present

## 2023-02-08 DIAGNOSIS — Z9221 Personal history of antineoplastic chemotherapy: Secondary | ICD-10-CM

## 2023-02-08 DIAGNOSIS — I2699 Other pulmonary embolism without acute cor pulmonale: Principal | ICD-10-CM | POA: Diagnosis present

## 2023-02-08 DIAGNOSIS — Z960 Presence of urogenital implants: Secondary | ICD-10-CM | POA: Diagnosis present

## 2023-02-08 DIAGNOSIS — E1122 Type 2 diabetes mellitus with diabetic chronic kidney disease: Secondary | ICD-10-CM | POA: Insufficient documentation

## 2023-02-08 DIAGNOSIS — I82431 Acute embolism and thrombosis of right popliteal vein: Secondary | ICD-10-CM | POA: Diagnosis not present

## 2023-02-08 DIAGNOSIS — I120 Hypertensive chronic kidney disease with stage 5 chronic kidney disease or end stage renal disease: Secondary | ICD-10-CM | POA: Diagnosis present

## 2023-02-08 DIAGNOSIS — N250 Renal osteodystrophy: Secondary | ICD-10-CM | POA: Diagnosis not present

## 2023-02-08 DIAGNOSIS — Z833 Family history of diabetes mellitus: Secondary | ICD-10-CM

## 2023-02-08 DIAGNOSIS — Z90710 Acquired absence of both cervix and uterus: Secondary | ICD-10-CM

## 2023-02-08 DIAGNOSIS — N179 Acute kidney failure, unspecified: Secondary | ICD-10-CM

## 2023-02-08 DIAGNOSIS — C541 Malignant neoplasm of endometrium: Secondary | ICD-10-CM

## 2023-02-08 DIAGNOSIS — Z87440 Personal history of urinary (tract) infections: Secondary | ICD-10-CM

## 2023-02-08 LAB — COMPREHENSIVE METABOLIC PANEL
ALT: 10 U/L (ref 0–44)
AST: 12 U/L — ABNORMAL LOW (ref 15–41)
Albumin: 3.1 g/dL — ABNORMAL LOW (ref 3.5–5.0)
Alkaline Phosphatase: 95 U/L (ref 38–126)
Anion gap: 11 (ref 5–15)
BUN: 104 mg/dL — ABNORMAL HIGH (ref 6–20)
CO2: 16 mmol/L — ABNORMAL LOW (ref 22–32)
Calcium: 9 mg/dL (ref 8.9–10.3)
Chloride: 107 mmol/L (ref 98–111)
Creatinine, Ser: 3.59 mg/dL — ABNORMAL HIGH (ref 0.44–1.00)
GFR, Estimated: 14 mL/min — ABNORMAL LOW (ref >60)
Glucose, Bld: 229 mg/dL — ABNORMAL HIGH (ref 70–99)
Potassium: 4.8 mmol/L (ref 3.5–5.1)
Sodium: 134 mmol/L — ABNORMAL LOW (ref 135–145)
Total Bilirubin: 0.2 mg/dL — ABNORMAL LOW (ref 0.3–1.2)
Total Protein: 7.6 g/dL (ref 6.5–8.1)

## 2023-02-08 LAB — CBC WITH DIFFERENTIAL/PLATELET
Abs Immature Granulocytes: 0.07 10*3/uL (ref 0.00–0.07)
Basophils Absolute: 0 10*3/uL (ref 0.0–0.1)
Basophils Relative: 1 %
Eosinophils Absolute: 0.2 10*3/uL (ref 0.0–0.5)
Eosinophils Relative: 4 %
HCT: 25.3 % — ABNORMAL LOW (ref 36.0–46.0)
Hemoglobin: 8.6 g/dL — ABNORMAL LOW (ref 12.0–15.0)
Immature Granulocytes: 1 %
Lymphocytes Relative: 14 %
Lymphs Abs: 0.9 10*3/uL (ref 0.7–4.0)
MCH: 31.3 pg (ref 26.0–34.0)
MCHC: 34 g/dL (ref 30.0–36.0)
MCV: 92 fL (ref 80.0–100.0)
Monocytes Absolute: 0.5 10*3/uL (ref 0.1–1.0)
Monocytes Relative: 8 %
Neutro Abs: 4.3 10*3/uL (ref 1.7–7.7)
Neutrophils Relative %: 72 %
Platelets: 181 10*3/uL (ref 150–400)
RBC: 2.75 MIL/uL — ABNORMAL LOW (ref 3.87–5.11)
RDW: 15.6 % — ABNORMAL HIGH (ref 11.5–15.5)
WBC: 6 10*3/uL (ref 4.0–10.5)
nRBC: 0 % (ref 0.0–0.2)

## 2023-02-08 LAB — URINALYSIS, ROUTINE W REFLEX MICROSCOPIC
Bilirubin Urine: NEGATIVE
Glucose, UA: NEGATIVE mg/dL
Ketones, ur: NEGATIVE mg/dL
Nitrite: NEGATIVE
Protein, ur: >=300 mg/dL — AB
Specific Gravity, Urine: 1.025 (ref 1.005–1.030)
pH: 6 (ref 5.0–8.0)

## 2023-02-08 LAB — URINALYSIS, MICROSCOPIC (REFLEX): WBC, UA: >50 WBC/hpf (ref 0–5)

## 2023-02-08 LAB — GLUCOSE, CAPILLARY
Glucose-Capillary: 106 mg/dL — ABNORMAL HIGH (ref 70–99)
Glucose-Capillary: 150 mg/dL — ABNORMAL HIGH (ref 70–99)

## 2023-02-08 LAB — PROTIME-INR
INR: 1.4 — ABNORMAL HIGH (ref 0.8–1.2)
Prothrombin Time: 17.4 seconds — ABNORMAL HIGH (ref 11.4–15.2)

## 2023-02-08 LAB — HEPARIN LEVEL (UNFRACTIONATED): Heparin Unfractionated: 0.15 IU/mL — ABNORMAL LOW (ref 0.30–0.70)

## 2023-02-08 LAB — TROPONIN I (HIGH SENSITIVITY)
Troponin I (High Sensitivity): 5 ng/L (ref <18)
Troponin I (High Sensitivity): 4 ng/L (ref <18)

## 2023-02-08 MED ORDER — INSULIN ASPART 100 UNIT/ML IJ SOLN
0.0000 [IU] | Freq: Every day | INTRAMUSCULAR | Status: DC
  Administered 2023-02-09: 3 [IU] via SUBCUTANEOUS

## 2023-02-08 MED ORDER — ONDANSETRON HCL 4 MG/2ML IJ SOLN
4.0000 mg | Freq: Once | INTRAMUSCULAR | Status: AC
  Administered 2023-02-08: 4 mg via INTRAVENOUS
  Filled 2023-02-08: qty 2

## 2023-02-08 MED ORDER — SODIUM CHLORIDE 0.9 % IV SOLN
500.0000 mg | INTRAVENOUS | Status: DC
  Administered 2023-02-08: 500 mg via INTRAVENOUS
  Filled 2023-02-08: qty 5

## 2023-02-08 MED ORDER — HEPARIN BOLUS VIA INFUSION
3700.0000 [IU] | Freq: Once | INTRAVENOUS | Status: AC
  Administered 2023-02-08: 3700 [IU] via INTRAVENOUS

## 2023-02-08 MED ORDER — INSULIN DETEMIR 100 UNIT/ML ~~LOC~~ SOLN
20.0000 [IU] | Freq: Two times a day (BID) | SUBCUTANEOUS | Status: DC
  Administered 2023-02-08 – 2023-02-10 (×3): 20 [IU] via SUBCUTANEOUS
  Filled 2023-02-08 (×5): qty 0.2

## 2023-02-08 MED ORDER — SODIUM CHLORIDE 0.9% FLUSH
10.0000 mL | Freq: Two times a day (BID) | INTRAVENOUS | Status: DC
  Administered 2023-02-10: 10 mL

## 2023-02-08 MED ORDER — DOCUSATE SODIUM 100 MG PO CAPS
100.0000 mg | ORAL_CAPSULE | Freq: Two times a day (BID) | ORAL | Status: DC
  Administered 2023-02-08 – 2023-02-09 (×2): 100 mg via ORAL
  Filled 2023-02-08 (×2): qty 1

## 2023-02-08 MED ORDER — CHLORHEXIDINE GLUCONATE CLOTH 2 % EX PADS
6.0000 | MEDICATED_PAD | Freq: Every day | CUTANEOUS | Status: DC
  Administered 2023-02-09 – 2023-02-10 (×2): 6 via TOPICAL

## 2023-02-08 MED ORDER — SODIUM CHLORIDE 0.9% FLUSH
3.0000 mL | Freq: Two times a day (BID) | INTRAVENOUS | Status: DC
  Administered 2023-02-09 – 2023-02-10 (×3): 3 mL via INTRAVENOUS

## 2023-02-08 MED ORDER — GABAPENTIN 100 MG PO CAPS
100.0000 mg | ORAL_CAPSULE | Freq: Three times a day (TID) | ORAL | Status: DC
  Administered 2023-02-08 – 2023-02-10 (×7): 100 mg via ORAL
  Filled 2023-02-08 (×7): qty 1

## 2023-02-08 MED ORDER — PROCHLORPERAZINE MALEATE 10 MG PO TABS: 10.0000 mg | ORAL_TABLET | Freq: Four times a day (QID) | ORAL | Status: DC | PRN

## 2023-02-08 MED ORDER — HYDROMORPHONE HCL 1 MG/ML IJ SOLN
1.0000 mg | Freq: Once | INTRAMUSCULAR | Status: AC
  Administered 2023-02-08: 1 mg via INTRAVENOUS
  Filled 2023-02-08: qty 1

## 2023-02-08 MED ORDER — SODIUM CHLORIDE 0.9% FLUSH
10.0000 mL | INTRAVENOUS | Status: DC | PRN
  Administered 2023-02-10: 10 mL

## 2023-02-08 MED ORDER — HEPARIN (PORCINE) 25000 UT/250ML-% IV SOLN
900.0000 [IU]/h | INTRAVENOUS | Status: DC
  Administered 2023-02-09: 900 [IU]/h via INTRAVENOUS
  Filled 2023-02-08 (×2): qty 250

## 2023-02-08 MED ORDER — POLYETHYLENE GLYCOL 3350 17 G PO PACK: 17.0000 g | PACK | Freq: Every day | ORAL | Status: DC | PRN

## 2023-02-08 MED ORDER — OXYBUTYNIN CHLORIDE ER 10 MG PO TB24
10.0000 mg | ORAL_TABLET | Freq: Every day | ORAL | Status: DC
  Administered 2023-02-09 – 2023-02-10 (×2): 10 mg via ORAL
  Filled 2023-02-08 (×2): qty 1

## 2023-02-08 MED ORDER — SODIUM CHLORIDE 0.9 % IV BOLUS
1000.0000 mL | Freq: Once | INTRAVENOUS | Status: AC
  Administered 2023-02-08: 1000 mL via INTRAVENOUS

## 2023-02-08 MED ORDER — INSULIN ASPART 100 UNIT/ML IJ SOLN
0.0000 [IU] | Freq: Three times a day (TID) | INTRAMUSCULAR | Status: DC
  Administered 2023-02-09: 3 [IU] via SUBCUTANEOUS
  Administered 2023-02-10: 2 [IU] via SUBCUTANEOUS

## 2023-02-08 MED ORDER — HEPARIN BOLUS VIA INFUSION
1500.0000 [IU] | Freq: Once | INTRAVENOUS | Status: AC
  Administered 2023-02-08: 1500 [IU] via INTRAVENOUS
  Filled 2023-02-08: qty 1500

## 2023-02-08 MED ORDER — OXYCODONE HCL 5 MG PO TABS
5.0000 mg | ORAL_TABLET | ORAL | Status: DC | PRN
  Administered 2023-02-08 – 2023-02-09 (×3): 5 mg via ORAL
  Filled 2023-02-08 (×3): qty 1

## 2023-02-08 MED ORDER — GABAPENTIN 300 MG PO CAPS: 300.0000 mg | ORAL_CAPSULE | Freq: Three times a day (TID) | ORAL | Status: DC

## 2023-02-08 MED ORDER — ACETAMINOPHEN 325 MG PO TABS: 650.0000 mg | ORAL_TABLET | Freq: Four times a day (QID) | ORAL | Status: DC | PRN

## 2023-02-08 MED ORDER — SODIUM BICARBONATE 650 MG PO TABS
650.0000 mg | ORAL_TABLET | Freq: Three times a day (TID) | ORAL | Status: DC
  Administered 2023-02-08 – 2023-02-10 (×4): 650 mg via ORAL
  Filled 2023-02-08 (×5): qty 1

## 2023-02-08 MED ORDER — SODIUM CHLORIDE 0.9 % IV SOLN
3.0000 g | Freq: Two times a day (BID) | INTRAVENOUS | Status: DC
  Administered 2023-02-08 – 2023-02-10 (×4): 3 g via INTRAVENOUS
  Filled 2023-02-08 (×8): qty 8

## 2023-02-08 MED ORDER — ACETAMINOPHEN 650 MG RE SUPP: 650.0000 mg | Freq: Four times a day (QID) | RECTAL | Status: DC | PRN

## 2023-02-08 NOTE — ED Notes (Signed)
Attempted to call report x 1  

## 2023-02-08 NOTE — ED Notes (Signed)
Report given to [facility]. 

## 2023-02-08 NOTE — ED Triage Notes (Signed)
C/o left sided abdominal pain and flank pain x 2 days with N/V. Denies urinary symptoms. Hx of kidney stones.

## 2023-02-08 NOTE — Assessment & Plan Note (Addendum)
Is challenging to interpret patient urinalysis given she has had leukocytosis in the urine for a very long time.  However note that patient currently presents with flank pain.  And therefore I will follow-up the urine culture as ordered and continue with Unasyn for potential complicated UTI.  Quantify patient's proteinuria with protein creatinine ratio

## 2023-02-08 NOTE — Progress Notes (Addendum)
ANTICOAGULATION CONSULT NOTE - Follow Up Consult  Pharmacy Consult for heparin Indication:  hx VTE  Allergies  Allergen Reactions   Paclitaxel Anaphylaxis, Swelling and Other (See Comments)    Made everything swell   Taxotere [Docetaxel] Anaphylaxis, Swelling and Other (See Comments)    Throat swelling    Patient Measurements: Height: 5\' 4"  (162.6 cm) Weight: 61.2 kg (135 lb) IBW/kg (Calculated) : 54.7 Heparin Dosing Weight: 61.2kg  Vital Signs: Temp: 97.4 F (36.3 C) (07/18 1918) Temp Source: Oral (07/18 1918) BP: 133/67 (07/18 1918) Pulse Rate: 51 (07/18 1918)  Labs: Recent Labs    02/08/23 0922 02/08/23 0923 02/08/23 1249 02/08/23 2120  HGB 8.6*  --   --   --   HCT 25.3*  --   --   --   PLT 181  --   --   --   LABPROT 17.4*  --   --   --   INR 1.4*  --   --   --   HEPARINUNFRC  --   --   --  0.15*  CREATININE 3.59*  --   --   --   TROPONINIHS  --  5 4  --     Estimated Creatinine Clearance: 14.8 mL/min (A) (by C-G formula based on SCr of 3.59 mg/dL (H)).   Medical History: 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 failure 08/18/2021   Diabetes mellitus without complication (HCC) type 2    Endometrial cancer, FIGO stage IVB (HCC) 08/18/2021   Goals of care, counseling/discussion 08/18/2021   History of kidney stones    Hypertension    Leg DVT (deep venous thromboembolism), chronic, right (HCC) 08/18/2021   Ovarian cancer (HCC) 2013   Pneumonia 12/03/2022   Wears glasses     Medications:  Infusions:   ampicillin-sulbactam (UNASYN) IV 3 g (02/08/23 2151)   azithromycin 500 mg (02/08/23 2042)   heparin 900 Units/hr (02/08/23 2043)    Assessment: 56 yof presented to the ED with flank pain. She is on chronic warfarin for history of VTE but her INR is subtherapeutic at 1.4 To start IV heparin. Baseline Hgb is low at 8.6 and platelets are WNL. No bleeding noted.    02/08/23 Heparin level = 0.15 (subtherapeutic) with heparin gtt @ 900 units/hr No complications of therapy reported by RN  Goal of Therapy:  Heparin level 0.3-0.7 units/ml Monitor platelets by anticoagulation protocol: Yes   Plan:  Heparin rebolus 1500 units IV x 1 Increase Heparin gtt to 1100 units/hr Check an 8 hr heparin level after rate increase Daily heparin level and CBC  Harlyn Italiano, Joselyn Glassman, PharmD 02/08/2023,11:24 PM  ADDENDUM: Notified by RN ~ 01:15 that patient's IV site had infiltrated and had likely been that way for an undetermined amount of time.  Above bolus dose had already been administered.  Advised RN to reduce heparin infusion rate back to 900 units/hr and will recheck heparin level in 8 hr.  Terrilee Files, PharmD 02/09/2023 @ 02:59

## 2023-02-08 NOTE — ED Provider Notes (Signed)
[facility] Provider Note   CSN: [id number] Arrival date & time: 02/08/23  0849     History  Chief Complaint  Patient presents with   Flank Pain    Jaime Gomez is a 57 y.o. female.  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 Patient reports pain began a couple days ago. She reports left sided abdominal pain radiating to her chest. Chest pain is worsened with deep breathing. She has also had cough, nausea, vomiting during this time. Denies fever, chills, dysuria, hematemesis.    Flank Pain Associated symptoms include chest pain and abdominal pain.       Home Medications Prior to Admission medications   Medication Sig Start Date End Date Taking? Authorizing Provider  acetaminophen (TYLENOL) 500 MG tablet Take 500 mg by mouth every 6 (six) hours as needed for mild pain.    [provider]  carvedilol (COREG) 12.5 MG tablet Take 1 tablet (12.5 mg total) by mouth 2 (two) times daily with a meal. 08/30/22   Ennever, Rose Phi, MD  Continuous Glucose Sensor (FREESTYLE LIBRE 3 SENSOR) MISC 1 Act by Does not apply route daily. Place 1 sensor on the skin every 14 days. Use to check glucose continuously 12/26/22   Karie Georges, MD  furosemide (LASIX) 20 MG tablet TAKE 1 TABLET BY MOUTH EVERY DAY 01/22/23   Karie Georges, MD  gabapentin (NEURONTIN) 300 MG capsule Take 1 capsule (300 mg total) by mouth 3 (three) times daily. 12/15/22   Erenest Blank, NP  insulin aspart (NOVOLOG) 100 UNIT/ML FlexPen Inject 2 Units into the skin 3 times daily with meals. 12/12/22   Marinda Elk, MD  insulin detemir (LEVEMIR) 100 UNIT/ML injection Inject 0.2 mLs (20 Units total) into the skin 2 (two) times daily. 08/30/22   [name], MD  oxybutynin (DITROPAN-XL) 10 MG 24 hr tablet Take 10 mg by mouth daily. 08/27/22   [provider]   oxycodone (OXY-IR) 5 MG capsule Take 1 capsule (5 mg total) by mouth every 4 (four) hours as needed for pain. 12/13/22   [name], MD  polyethylene glycol (MIRALAX / GLYCOLAX) 17 g packet Take 17 g by mouth daily as needed for mild constipation. 09/05/21   Pokhrel, Rebekah Chesterfield, MD  prochlorperazine (COMPAZINE) 10 MG tablet Take 10 mg by mouth every 6 (six) hours as needed for nausea or vomiting. 08/18/21   [provider]  venlafaxine XR (EFFEXOR XR) 37.5 MG 24 hr capsule Take 1 capsule (37.5 mg total) by mouth daily with breakfast. 12/26/22   Karie Georges, MD  warfarin (COUMADIN) 5 MG tablet Take 1 tablet (5 mg) by mouth daily. 01/24/23   Karie Georges, MD      Allergies    Taxotere [docetaxel]    Review of Systems   Review of Systems  Constitutional:  Positive for chills. Negative for fever.  Respiratory:  Positive for cough.   Cardiovascular:  Positive for chest pain.  Gastrointestinal:  Positive for abdominal pain, nausea and vomiting. Negative for constipation and diarrhea.  Genitourinary:  Positive for flank pain. Negative for dysuria and hematuria.    Physical Exam Updated Vital Signs BP (!) 104/59   Pulse 65   Temp 97.9 F (36.6 C) (Oral)   Resp 18   Ht 5\' 4"  (1.626 m)   Wt 61.2 kg  SpO2 94%   BMI 23.17 kg/m  Physical Exam Constitutional:      General: She is not in acute distress.    Appearance: She is not toxic-appearing.  Cardiovascular:     Rate and Rhythm: Normal rate and regular rhythm.     Heart sounds: Normal heart sounds.  Pulmonary:     Effort: Pulmonary effort is normal. No respiratory distress.     Breath sounds: Normal breath sounds.  Abdominal:     Palpations: Abdomen is soft.     Tenderness: There is abdominal tenderness.  Neurological:     General: No focal deficit present.     Mental Status: She is alert and oriented to person, place, and time.     ED Results / Procedures / Treatments   Labs (all labs ordered are listed,  but only abnormal results are displayed) Labs Reviewed  URINALYSIS, ROUTINE W REFLEX MICROSCOPIC - Abnormal; Notable for the following components:      Result Value   APPearance CLOUDY (*)    Hgb urine dipstick LARGE (*)    Protein, ur >=300 (*)    Leukocytes,Ua LARGE (*)    All other components within normal limits  CBC WITH DIFFERENTIAL/PLATELET - Abnormal; Notable for the following components:   RBC 2.75 (*)    Hemoglobin 8.6 (*)    HCT 25.3 (*)    RDW 15.6 (*)    All other components within normal limits  COMPREHENSIVE METABOLIC PANEL - Abnormal; Notable for the following components:   Sodium 134 (*)    CO2 16 (*)    Glucose, Bld 229 (*)    BUN 104 (*)    Creatinine, Ser 3.59 (*)    Albumin 3.1 (*)    AST 12 (*)    Total Bilirubin 0.2 (*)    GFR, Estimated 14 (*)    All other components within normal limits  PROTIME-INR - Abnormal; Notable for the following components:   Prothrombin Time 17.4 (*)    INR 1.4 (*)    All other components within normal limits  URINALYSIS, MICROSCOPIC (REFLEX) - Abnormal; Notable for the following components:   Bacteria, UA RARE (*)    All other components within normal limits  URINE CULTURE  HEPARIN LEVEL (UNFRACTIONATED)  TROPONIN I (HIGH SENSITIVITY)  TROPONIN I (HIGH SENSITIVITY)    EKG None  Radiology CT Chest Wo Contrast  Result Date: 02/08/2023 CLINICAL DATA:  Partially visualized masslike consolidation; * Tracking Code: BO * EXAM: CT CHEST WITHOUT CONTRAST TECHNIQUE: Multidetector CT imaging of the chest was performed following the standard protocol without IV contrast. RADIATION DOSE REDUCTION: This exam was performed according to the departmental dose-optimization program which includes automated exposure control, adjustment of the mA and/or kV according to patient size and/or use of iterative reconstruction technique. COMPARISON:  Same day CT of the abdomen and pelvis; PET-CT dated August 21, 2022; PET-CT dated February 07, 2023;  CT renal stone dated February 01, 2023 FINDINGS: Cardiovascular: Normal heart size. No pericardial effusion. Normal caliber thoracic aorta with mild atherosclerotic disease. Severe coronary artery calcifications. Mediastinum/Nodes: Esophagus and thyroid are unremarkable. No enlarging lymph nodes seen in the chest. Reference right pre-vascular lymph node measuring 9 mm in short axis on series 2, image 59, unchanged. Lungs/Pleura: Bilateral pulmonary masses again seen. Left perihilar mass measures 4.7 x 4.6 cm on series 2, image 68, previously 3.8 x 2.9 cm on August 21, 2022 PET-CT. Associated increased occlusion of the left lower lobe anteromedial and lateral bronchopulmonary  segments. Dominant right upper lobe mass is decreased in size, however there are new areas of peripheral nodularity. Measures 2.5 x 2.9 cm on series 2, image 39, previously 3.1 x 3.4 cm. New peripheral nodular component measuring 7 mm on series 3, image 33. Dominant right lower lobe pulmonary nodule is similar in size when compared with prior August 21, 2022 PET-CT, however there are new peripheral nodular components. Cystic lesion of the right lower lobe measuring up to 3.2 cm previously, 2.5 cm, with new peripheral nodular components, largest measures 9 mm on series 3, image 90. Nodular component are increased in size when lung bases seen on CT renal stone CT dated January 02, 2023. Additional new bilateral solid pulmonary nodules are seen. Reference solid nodule of the left lower lobe measuring 7 mm on series 3, image 76. Upper Abdomen: No acute abnormality. See same day separately dictated CT of the abdomen and pelvis for further discussion. Musculoskeletal: No chest wall mass or suspicious bone lesions identified. IMPRESSION: 1. Left perihilar masses is increased in size when compared with August 21, 2022 PET-CT, findings are concerning for progressive disease. See recently performed PET-CT dated February 07, 2023 for further discussion. 2. Cystic  lesion of the right lower lobe with nodular components, nodules are increased size when compared with portions of the lung visualized on January 02, 2023 abdomen and pelvis CT. 3. Additional new bilateral solid pulmonary nodules are seen when compared with PET-CT dated August 21, 2022. 4. No enlarging lymph nodes seen in the chest. 5. Aortic Atherosclerosis (ICD10-I70.0). Electronically Signed   By: Allegra Lai M.D.   On: 02/08/2023 11:34   CT Renal Stone Study  Result Date: 02/08/2023 CLINICAL DATA:  History of endometrial cancer with 2 day history of left flank pain associated with nausea and vomiting. * Tracking Code: BO * EXAM: CT ABDOMEN AND PELVIS WITHOUT CONTRAST TECHNIQUE: Multidetector CT imaging of the abdomen and pelvis was performed following the standard protocol without IV contrast. RADIATION DOSE REDUCTION: This exam was performed according to the departmental dose-optimization program which includes automated exposure control, adjustment of the mA and/or kV according to patient size and/or use of iterative reconstruction technique. COMPARISON:  Nuclear medicine PET dated 02/07/2023, CT abdomen and pelvis dated 01/02/2023 FINDINGS: Lower chest: Partially imaged masslike consolidation involving the left hilum and right lower lobe are new from 01/02/2023. Medial right lower lobe nodule measuring 1.0 x 0.8 cm (4:10), previously 0.6 x 0.5 cm. Slightly inferiorly, a 6 mm subsolid nodule is new (4:15). Previously noted irregular/clustered nodule in the medial left lower lobe is no longer seen. No pleural effusion or pneumothorax demonstrated. Partially imaged heart size is normal. Coronary artery calcifications. Hepatobiliary: No focal hepatic lesions. No intra or extrahepatic biliary ductal dilation. Cholelithiasis. Pancreas: No focal lesions or main ductal dilation. Spleen: Normal in size without focal abnormality. Adrenals/Urinary Tract: No adrenal nodules. Bilateral ureteral stents and  percutaneous left nephrostomy tube in-situ, similar to IR procedure dated 01/09/2023. Similar right urothelial thickening and mild pelvic fullness. No suspicious renal mass, calculi or hydronephrosis. Urinary bladder is decompressed. Stomach/Bowel: Normal appearance of the stomach. No evidence of bowel wall thickening, distention, or inflammatory changes. Similar rectal distention containing large volume stool. Small to moderate volume stool within the remainder of the colon. Fecalization of material within the terminal ileum. Normal appendix. Vascular/Lymphatic: Aortic atherosclerosis. Infrarenal IVC filter in-situ. No enlarged abdominal or pelvic lymph nodes. Reproductive: No adnexal masses. Other: Small volume presacral edema. No free air or fluid  collection. Musculoskeletal: No acute or abnormal lytic or blastic osseous lesions. Postsurgical changes of the anterior abdominal wall. IMPRESSION: 1. Partially imaged masslike consolidation involving the left hilum and right lower lobe are new from 01/02/2023 and may represent infection/inflammation or metastatic disease. 2. Medial right lower lobe nodule measuring 1.0 x 0.8 cm, previously 0.6 x 0.5 cm. Slightly inferiorly, a 6 mm subsolid nodule is new. Previously noted irregular/clustered nodule in the medial left lower lobe is no longer seen. 3. Bilateral ureteral stents and percutaneous left nephrostomy tube in-situ, similar to IR procedure dated 01/09/2023. Similar right urothelial thickening and mild pelvic fullness. 4. Similar rectal distention containing large volume stool. Small to moderate volume stool within the remainder of the colon. Fecalization of material within the terminal ileum likely reflecting slowed transit. 5. Aortic Atherosclerosis (ICD10-I70.0). Coronary artery calcifications. Assessment for potential risk factor modification, dietary therapy or pharmacologic therapy may be warranted, if clinically indicated. Electronically Signed   By: Agustin Cree M.D.   On: 02/08/2023 09:34    Procedures Procedures    Medications Ordered in ED Medications  heparin bolus via infusion 3,700 Units (has no administration in time range)  heparin ADULT infusion 100 units/mL (25000 units/290mL) (has no administration in time range)  sodium chloride 0.9 % bolus 1,000 mL (0 mLs Intravenous Stopped 02/08/23 1045)  HYDROmorphone (DILAUDID) injection 1 mg (1 mg Intravenous Given 02/08/23 0941)  ondansetron (ZOFRAN) injection 4 mg (4 mg Intravenous Given 02/08/23 4742)    ED Course/ Medical Decision Making/ A&P                             Medical Decision Making Amount and/or Complexity of Data Reviewed Labs: ordered. Radiology: ordered.  Medical Decision Making:   Elvina Bosch is a 57 y.o. female who presented to the ED today with abdominal pain detailed above.    Patient's presentation is complicated by their history of metastatic adenocarcinoma.  Complete initial physical exam performed, notably the patient  was ill-appearing.    Reviewed and confirmed nursing documentation for past medical history, family history, social history.    Initial Assessment:   With the patient's presentation of abdominal and chest pain, most likely diagnosis is PE/DVT. Other diagnoses were considered including (but not limited to) renal stone, cancer pain, hematoma, metastatic disease, pneumothorax, pneumonia. These are considered less likely due to history of present illness and physical exam findings.   This is most consistent with an acute complicated illness  Initial Plan:   Screening labs including CBC and Metabolic panel to evaluate for infectious or metabolic etiology of disease.  Urinalysis with reflex culture ordered to evaluate for UTI or relevant urologic/nephrologic pathology.  CXR to evaluate for structural/infectious intrathoracic pathology.  EKG to evaluate for cardiac pathology Objective evaluation as below reviewed   Initial Study Results:    Laboratory  All laboratory results reviewed without evidence of clinically relevant pathology.   Exceptions include:  Creatinine: 3.59, Prothrombin time: 17.4, INR 1.4, UA showing large leukocytes  EKG EKG was reviewed independently. Rate, rhythm, axis, intervals all examined and without medically relevant abnormality. ST segments without concerns for elevations.    Radiology:  All images reviewed independently. Agree with radiology report at this time.   CT Chest Wo Contrast  Result Date: 02/08/2023 CLINICAL DATA:  Partially visualized masslike consolidation; * Tracking Code: BO * EXAM: CT CHEST WITHOUT CONTRAST TECHNIQUE: Multidetector CT imaging of the chest was performed following  the standard protocol without IV contrast. RADIATION DOSE REDUCTION: This exam was performed according to the departmental dose-optimization program which includes automated exposure control, adjustment of the mA and/or kV according to patient size and/or use of iterative reconstruction technique. COMPARISON:  Same day CT of the abdomen and pelvis; PET-CT dated August 21, 2022; PET-CT dated February 07, 2023; CT renal stone dated February 01, 2023 FINDINGS: Cardiovascular: Normal heart size. No pericardial effusion. Normal caliber thoracic aorta with mild atherosclerotic disease. Severe coronary artery calcifications. Mediastinum/Nodes: Esophagus and thyroid are unremarkable. No enlarging lymph nodes seen in the chest. Reference right pre-vascular lymph node measuring 9 mm in short axis on series 2, image 59, unchanged. Lungs/Pleura: Bilateral pulmonary masses again seen. Left perihilar mass measures 4.7 x 4.6 cm on series 2, image 68, previously 3.8 x 2.9 cm on August 21, 2022 PET-CT. Associated increased occlusion of the left lower lobe anteromedial and lateral bronchopulmonary segments. Dominant right upper lobe mass is decreased in size, however there are new areas of peripheral nodularity. Measures 2.5 x 2.9 cm on series 2,  image 39, previously 3.1 x 3.4 cm. New peripheral nodular component measuring 7 mm on series 3, image 33. Dominant right lower lobe pulmonary nodule is similar in size when compared with prior August 21, 2022 PET-CT, however there are new peripheral nodular components. Cystic lesion of the right lower lobe measuring up to 3.2 cm previously, 2.5 cm, with new peripheral nodular components, largest measures 9 mm on series 3, image 90. Nodular component are increased in size when lung bases seen on CT renal stone CT dated January 02, 2023. Additional new bilateral solid pulmonary nodules are seen. Reference solid nodule of the left lower lobe measuring 7 mm on series 3, image 76. Upper Abdomen: No acute abnormality. See same day separately dictated CT of the abdomen and pelvis for further discussion. Musculoskeletal: No chest wall mass or suspicious bone lesions identified. IMPRESSION: 1. Left perihilar masses is increased in size when compared with August 21, 2022 PET-CT, findings are concerning for progressive disease. See recently performed PET-CT dated February 07, 2023 for further discussion. 2. Cystic lesion of the right lower lobe with nodular components, nodules are increased size when compared with portions of the lung visualized on January 02, 2023 abdomen and pelvis CT. 3. Additional new bilateral solid pulmonary nodules are seen when compared with PET-CT dated August 21, 2022. 4. No enlarging lymph nodes seen in the chest. 5. Aortic Atherosclerosis (ICD10-I70.0). Electronically Signed   By: Allegra Lai M.D.   On: 02/08/2023 11:34   CT Renal Stone Study  Result Date: 02/08/2023 CLINICAL DATA:  History of endometrial cancer with 2 day history of left flank pain associated with nausea and vomiting. * Tracking Code: BO * EXAM: CT ABDOMEN AND PELVIS WITHOUT CONTRAST TECHNIQUE: Multidetector CT imaging of the abdomen and pelvis was performed following the standard protocol without IV contrast. RADIATION DOSE  REDUCTION: This exam was performed according to the departmental dose-optimization program which includes automated exposure control, adjustment of the mA and/or kV according to patient size and/or use of iterative reconstruction technique. COMPARISON:  Nuclear medicine PET dated 02/07/2023, CT abdomen and pelvis dated 01/02/2023 FINDINGS: Lower chest: Partially imaged masslike consolidation involving the left hilum and right lower lobe are new from 01/02/2023. Medial right lower lobe nodule measuring 1.0 x 0.8 cm (4:10), previously 0.6 x 0.5 cm. Slightly inferiorly, a 6 mm subsolid nodule is new (4:15). Previously noted irregular/clustered nodule in the medial left  lower lobe is no longer seen. No pleural effusion or pneumothorax demonstrated. Partially imaged heart size is normal. Coronary artery calcifications. Hepatobiliary: No focal hepatic lesions. No intra or extrahepatic biliary ductal dilation. Cholelithiasis. Pancreas: No focal lesions or main ductal dilation. Spleen: Normal in size without focal abnormality. Adrenals/Urinary Tract: No adrenal nodules. Bilateral ureteral stents and percutaneous left nephrostomy tube in-situ, similar to IR procedure dated 01/09/2023. Similar right urothelial thickening and mild pelvic fullness. No suspicious renal mass, calculi or hydronephrosis. Urinary bladder is decompressed. Stomach/Bowel: Normal appearance of the stomach. No evidence of bowel wall thickening, distention, or inflammatory changes. Similar rectal distention containing large volume stool. Small to moderate volume stool within the remainder of the colon. Fecalization of material within the terminal ileum. Normal appendix. Vascular/Lymphatic: Aortic atherosclerosis. Infrarenal IVC filter in-situ. No enlarged abdominal or pelvic lymph nodes. Reproductive: No adnexal masses. Other: Small volume presacral edema. No free air or fluid collection. Musculoskeletal: No acute or abnormal lytic or blastic osseous  lesions. Postsurgical changes of the anterior abdominal wall. IMPRESSION: 1. Partially imaged masslike consolidation involving the left hilum and right lower lobe are new from 01/02/2023 and may represent infection/inflammation or metastatic disease. 2. Medial right lower lobe nodule measuring 1.0 x 0.8 cm, previously 0.6 x 0.5 cm. Slightly inferiorly, a 6 mm subsolid nodule is new. Previously noted irregular/clustered nodule in the medial left lower lobe is no longer seen. 3. Bilateral ureteral stents and percutaneous left nephrostomy tube in-situ, similar to IR procedure dated 01/09/2023. Similar right urothelial thickening and mild pelvic fullness. 4. Similar rectal distention containing large volume stool. Small to moderate volume stool within the remainder of the colon. Fecalization of material within the terminal ileum likely reflecting slowed transit. 5. Aortic Atherosclerosis (ICD10-I70.0). Coronary artery calcifications. Assessment for potential risk factor modification, dietary therapy or pharmacologic therapy may be warranted, if clinically indicated. Electronically Signed   By: Agustin Cree M.D.   On: 02/08/2023 09:34     Reassessment and Plan:   Patient is a 57 year old woman with history of metastatic adenocarcinoma on chemotherapy, recurrent thromboembolisms presenting with abdominal and chest pain. Vitals were stable throughout stay in ED and patient was afebrile. Labs showed baseline Creatinine 3.5, INR 1.4. Imaging showed new and enlarged lung nodule. PE study limited by patient's poor kidney function. Will get bilateral DVT studies. Per pharmacy, heparin administered due to subtherapeutic anticoagulation and suspicion for thrombosis. Oncology consulted, will admit for further treatment and observation.           Final Clinical Impression(s) / ED Diagnoses Final diagnoses:  Left lateral abdominal pain  Chest pain at rest    Rx / DC Orders ED Discharge Orders     None          Monna Fam, MD 02/08/23 1311    Virgina Norfolk, DO 02/08/23 1422

## 2023-02-08 NOTE — ED Provider Notes (Signed)
Supervised resident visit.  Patient here with left-sided flank pain chest pain, pleuritic pain.  History of blood clots on Coumadin here recently after failed multiple anticoagulants.  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  We did a broad workup to evaluate for urologic processes that she has a history of kidney stones with nephrostomy and stents.  He has CT scan, basic labs and CT of the chest.  Unable to fully evaluate for PE due to significant CKD.  She is on dialysis.  Overall lab work was stable.  No significant anemia or leukocytosis.  Troponin was normal.  Creatinine at baseline.  CT scan of the abdomen pelvis showed constipation but did not really show any acute urological processes.  CT of the chest showed increasing pulmonary nodules and lung masses.  No obvious pneumonia.  Urinalysis does not appear consistent with infection.  Ultimately her INR still subtherapeutic.  Cannot really fully rule out a PE and ultimately she is high risk for 1.  Talked with Dr. Pamelia Hoit with oncology who also agrees with me to start her on IV heparin empirically for thromboembolic disease.  She is hemodynamically stable with stable troponin.  Will talk with medicine if they want to pursue VQ scan but likely will be complicated given pulmonary nodules then masses that she has in her lungs.  Ultimately I think we just need to get her on a stable anticoagulation plan.  Will get DVT studies here.  Will admit to medicine for further care.  She probably also benefit from bowel regimen.  This chart was dictated using voice recognition software.  Despite best efforts to proofread,  errors can occur which can change the documentation meaning.    Virgina Norfolk, DO 02/08/23 1255

## 2023-02-08 NOTE — Progress Notes (Signed)
ANTICOAGULATION CONSULT NOTE - Initial Consult  Pharmacy Consult for heparin Indication:  hx VTE  Allergies  Allergen Reactions   Taxotere [Docetaxel] Swelling    Throat swelling    Patient Measurements: Height: 5\' 4"  (162.6 cm) Weight: 61.2 kg (135 lb) IBW/kg (Calculated) : 54.7 Heparin Dosing Weight: 61.2kg  Vital Signs: Temp: 97.9 F (36.6 C) (07/18 0901) Temp Source: Oral (07/18 0901) BP: 104/59 (07/18 1000) Pulse Rate: 65 (07/18 1130)  Labs: Recent Labs    02/08/23 0922 02/08/23 0923  HGB 8.6*  --   HCT 25.3*  --   PLT 181  --   LABPROT 17.4*  --   INR 1.4*  --   CREATININE 3.59*  --   TROPONINIHS  --  5    Estimated Creatinine Clearance: 14.8 mL/min (A) (by C-G formula based on SCr of 3.59 mg/dL (H)).   Medical History: 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 failure 08/18/2021   Diabetes mellitus without complication (HCC) type 2    Endometrial cancer, FIGO stage IVB (HCC) 08/18/2021   Goals of care, counseling/discussion 08/18/2021   History of kidney stones    Hypertension    Leg DVT (deep venous thromboembolism), chronic, right (HCC) 08/18/2021   Ovarian cancer (HCC) 2013   Pneumonia 12/03/2022   Wears glasses     Medications:  Infusions:   heparin      Assessment: 56 yof presented to the ED with flank pain. She is on chronic warfarin for history of VTE but her INR is subtherapeutic at 1.4 To start IV heparin. Baseline Hgb is low at 8.6 and platelets are WNL. No bleeding noted.   Goal of Therapy:  Heparin level 0.3-0.7 units/ml Monitor platelets by anticoagulation protocol: Yes   Plan:  Heparin bolus 3700 units IV x 1 Heparin gtt 900 units/hr Check an 8 hr heparin level Daily heparin level and CBC  Jaime Gomez, Drake Leach 02/08/2023,12:31 PM

## 2023-02-08 NOTE — Assessment & Plan Note (Addendum)
Patient has internal external nephrostomy to bladder stent/tube. Per patient no flushing is needed. Scheduled to be internalised on 02/21/2023 at [facility]. Patient does NOT have acute renal failure. Discussed with renal attending. No indication of dialysis yet. But advanced stage CKD. Not candidate for dialysis. See separate note.

## 2023-02-08 NOTE — ED Notes (Signed)
Called [facility] for Transport @2 :58pm

## 2023-02-08 NOTE — Assessment & Plan Note (Signed)
Sodium bicarbonate PO tabs ordered

## 2023-02-08 NOTE — Assessment & Plan Note (Signed)
Is a diagnosis of deep venous thrombosis since at least January 2023.  Patient describes having been initially on Eliquis, with progression of venous thromboembolism, subsequently changed to warfarin.  Most recently patient's warfarin therapy was entry interrupted for right subclavian port placement.  And patient now comes in with pleuritic left-sided chest pain.  Which may be due to pulmonary embolism or possible pneumonia.  Neither of those diagnosis cannot be ruled out given imaging finding of CAT scan with new worsening infiltrates.  Unfortunately, given patient's CKD, contrast could not be administered with the CAT scan.  Regardless, given the subtherapeutic INR that the patient has currently, patient has been started on IV intravenous heparin which will be continued.  I will treat the patient with IV Unasyn and azithromycin for possible pneumonia

## 2023-02-08 NOTE — Progress Notes (Signed)
Pharmacy Antibiotic Note  Jaime Gomez is a 57 y.o. female admitted on 02/08/2023 with chief complaint of new onset left flank pain as well as left infra axillary chest pain that is pleuritic in nature.  Pharmacy has been consulted for Unasyn dosing for pneumonia.  Plan: - Unasyn 3gm IV every 12 hours  - Monitor renal function (7/18: Scr = 3.59; CrCl=15) - Follow up culture results and clinical course.  Height: 5\' 4"  (162.6 cm) Weight: 61.2 kg (135 lb) IBW/kg (Calculated) : 54.7  Temp (24hrs), Avg:97.5 F (36.4 C), Min:97.3 F (36.3 C), Max:97.9 F (36.6 C)  Recent Labs  Lab 02/08/23 0922  WBC 6.0  CREATININE 3.59*    Estimated Creatinine Clearance: 14.8 mL/min (A) (by C-G formula based on SCr of 3.59 mg/dL (H)).    Allergies  Allergen Reactions   Paclitaxel Anaphylaxis, Swelling and Other (See Comments)    Made everything swell   Taxotere [Docetaxel] Anaphylaxis, Swelling and Other (See Comments)    Throat swelling    Antimicrobials this admission: 7/18 Unasyn >>  7/18 Azithromycin >>   Microbiology results: 7/18 BCx: sent 7/18 UCx: sent   Thank you for allowing pharmacy to be a part of this patient's care.  Seretha Estabrooks Tylene Fantasia 02/08/2023 6:24 PM

## 2023-02-08 NOTE — H&P (Signed)
History and Physical    Patient: Jaime Gomez QMV:[id number] DOB: 26-Aug-1965 DOA: 02/08/2023 DOS: the patient was seen and examined on 02/08/2023 PCP: Karie Georges, MD  Patient coming from: Home >> outside ER >> [facility]  Chief Complaint:  Chief Complaint  Patient presents with   Flank Pain   HPI: Jaime Gomez is a 57 y.o. female with medical history significant of metastatic endometrial cancer.  Patient describes having failed oral chemotherapy recently and about 3 weeks ago had a subcutaneous subclavian port placed on the right side with plan for intravenous chemotherapy.  At the time patient's chronic anticoagulation with warfarin was interrupted.  The coagulation was only restarted a week ago.  Patient reports taking her warfarin as prescribed.  Patient was in her usual state of health till approximately 2 days ago when she reports new onset of left flank pain as well as left infra axillary chest pain that is pleuritic in nature.  Patient does not report new shortness of breath cough fever palpitation presyncope or syncope.  Of note patient does have a internal/external nephrostomy tube on the left side that is capped and has no routine flushes advised by the urologist/IR doctor.  It is planned to be internalized at the end of this month.  Patient reports no change in her urinary habits.  Patient presented to outside ER with complaints of left infra axillary pain that started last evening, worse somewhat with deep inspiration.  ER workup as noted below.  Medical evaluation is sought for further assessment of pulmonary embolism and anticoagulation. Review of Systems: As mentioned in the history of present illness. All other systems reviewed and are negative. 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 failure 08/18/2021   Diabetes mellitus without complication (HCC) type 2    Endometrial cancer, FIGO stage IVB  (HCC) 08/18/2021   Goals of care, counseling/discussion 08/18/2021   History of kidney stones    Hypertension    Leg DVT (deep venous thromboembolism), chronic, right (HCC) 08/18/2021   Ovarian cancer (HCC) 2013   Pneumonia 12/03/2022   Wears glasses    Past Surgical History:  Procedure Laterality Date   ABDOMINAL HYSTERECTOMY     BOTOX INJECTION N/A 01/12/2023   Procedure: BOTOX INJECTION INTO BLADDER;  Surgeon: Jannifer Hick, MD;  Location: [facility];  Service: Urology;  Laterality: N/A;   CYSTOSCOPY W/ RETROGRADES Right 01/12/2023   Procedure: CYSTOSCOPY WITH RIGHT  RETROGRADE PYELOGRAM/ RIGHT URETERAL STENT CHANGE;  Surgeon: Jannifer Hick, MD;  Location: [facility];  Service: Urology;  Laterality: Right;   CYSTOSCOPY W/ URETERAL STENT PLACEMENT Bilateral 09/01/2021   Procedure: CYSTOSCOPY WITH RETROGRADE PYELOGRAM/URETERAL STENT PLACEMENT;  Surgeon: Jannifer Hick, MD;  Location: [facility];  Service: Urology;  Laterality: Bilateral;   CYSTOSCOPY WITH FULGERATION N/A 01/12/2023   Procedure: CYSTOSCOPY WITH FULGERATION;  Surgeon: Jannifer Hick, MD;  Location: [facility];  Service: Urology;  Laterality: N/A;   CYSTOSCOPY WITH STENT PLACEMENT Right 05/19/2022   Procedure: CYSTOSCOPY WITH STENT CHANGE;  Surgeon: Jannifer Hick, MD;  Location: [facility];  Service: Urology;  Laterality: Right;  ONLY NEEDS 30 MIN   HERNIA REPAIR     IR CONVERT LEFT NEPHROSTOMY TO NEPHROURETERAL CATH  11/02/2021   IR CONVERT LEFT NEPHROSTOMY TO NEPHROURETERAL CATH  01/09/2023   IR CV LINE INJECTION  08/23/2021   IR  DIL URETER LEFT  01/09/2023   IR IMAGING GUIDED PORT INSERTION  11/14/2021   IR IMAGING GUIDED PORT INSERTION  01/09/2023   IR NEPHROSTOMY EXCHANGE LEFT  11/02/2021   IR NEPHROSTOMY EXCHANGE LEFT  01/20/2022   IR NEPHROSTOMY EXCHANGE LEFT  11/29/2022   IR NEPHROSTOMY PLACEMENT LEFT  09/03/2021   IR REMOVAL TUN ACCESS W/ PORT W/O FL MOD SED  11/14/2021   IR REMOVAL  TUN ACCESS W/ PORT W/O FL MOD SED  12/04/2022   Social History:  reports that she has never smoked. She has never used smokeless tobacco. She reports that she does not drink alcohol and does not use drugs.  Allergies  Allergen Reactions   Taxotere [Docetaxel] Swelling    Throat swelling    Family History  Problem Relation Age of Onset   Hyperlipidemia Mother    Heart disease Mother    Diabetes Mother    Depression Mother    COPD Mother    Arthritis Mother    Heart attack Mother    Drug abuse Father    Alcohol abuse Father    Hyperlipidemia Sister    Heart disease Sister    Diabetes Sister    Arthritis Sister    Miscarriages / [place] Sister    Depression Brother     Prior to Admission medications   Medication Sig Start Date End Date Taking? Authorizing Provider  acetaminophen (TYLENOL) 500 MG tablet Take 500 mg by mouth every 6 (six) hours as needed for mild pain.    [provider]  carvedilol (COREG) 12.5 MG tablet Take 1 tablet (12.5 mg total) by mouth 2 (two) times daily with a meal. 08/30/22   Ennever, Rose Phi, MD  Continuous Glucose Sensor (FREESTYLE LIBRE 3 SENSOR) MISC 1 Act by Does not apply route daily. Place 1 sensor on the skin every 14 days. Use to check glucose continuously 12/26/22   Karie Georges, MD  furosemide (LASIX) 20 MG tablet TAKE 1 TABLET BY MOUTH EVERY DAY 01/22/23   Karie Georges, MD  gabapentin (NEURONTIN) 300 MG capsule Take 1 capsule (300 mg total) by mouth 3 (three) times daily. 12/15/22   Erenest Blank, NP  insulin aspart (NOVOLOG) 100 UNIT/ML FlexPen Inject 2 Units into the skin 3 times daily with meals. 12/12/22   Marinda Elk, MD  insulin detemir (LEVEMIR) 100 UNIT/ML injection Inject 0.2 mLs (20 Units total) into the skin 2 (two) times daily. 08/30/22   [name], MD  oxybutynin (DITROPAN-XL) 10 MG 24 hr tablet Take 10 mg by mouth daily. 08/27/22   [provider]  oxycodone (OXY-IR) 5 MG capsule Take 1  capsule (5 mg total) by mouth every 4 (four) hours as needed for pain. 12/13/22   [name], MD  polyethylene glycol (MIRALAX / GLYCOLAX) 17 g packet Take 17 g by mouth daily as needed for mild constipation. 09/05/21   Pokhrel, Rebekah Chesterfield, MD  prochlorperazine (COMPAZINE) 10 MG tablet Take 10 mg by mouth every 6 (six) hours as needed for nausea or vomiting. 08/18/21   [provider]  venlafaxine XR (EFFEXOR XR) 37.5 MG 24 hr capsule Take 1 capsule (37.5 mg total) by mouth daily with breakfast. 12/26/22   Karie Georges, MD  warfarin (COUMADIN) 5 MG tablet Take 1 tablet (5 mg) by mouth daily. 01/24/23   Karie Georges, MD    Physical Exam: Vitals:   02/08/23 1318 02/08/23 1500 02/08/23 1515 02/08/23 1654  BP:  117/74 (!) 120/59 119/71  Pulse:  (!) 53 (!) 44 (!) 53  Resp:  12 15 16   Temp: (!) 97.4 F (36.3 C)   (!) 97.3 F (36.3 C)  TempSrc: Oral   Oral  SpO2:  97% 97% 100%  Weight:      Height:       General: Patient sitting upright in stretcher with feet on the floor, on room air getting intravenous heparin via subcutaneous port on the right subclavian.  Patient does not appear to be distressed. Respiratory exam: Diminished breath sounds left base otherwise vesicular air entry Cardiovascular exam S1-S2 normal Abdomen all quadrants soft nontender Extremities warm without edema. Data Reviewed:  Labs on Admission:  Results for orders placed or performed during the hospital encounter of 02/08/23 (from the past 24 hour(s))  Urinalysis, Routine w reflex microscopic -Urine, Clean Catch     Status: Abnormal   Collection Time: 02/08/23  8:55 AM  Result Value Ref Range   Color, Urine YELLOW YELLOW   APPearance CLOUDY (A) CLEAR   Specific Gravity, Urine 1.025 1.005 - 1.030   pH 6.0 5.0 - 8.0   Glucose, UA NEGATIVE NEGATIVE mg/dL   Hgb urine dipstick LARGE (A) NEGATIVE   Bilirubin Urine NEGATIVE NEGATIVE   Ketones, ur NEGATIVE NEGATIVE mg/dL   Protein, ur >=347 (A) NEGATIVE  mg/dL   Nitrite NEGATIVE NEGATIVE   Leukocytes,Ua LARGE (A) NEGATIVE  Urinalysis, Microscopic (reflex)     Status: Abnormal   Collection Time: 02/08/23  8:55 AM  Result Value Ref Range   RBC / HPF 11-20 0 - 5 RBC/hpf   WBC, UA >50 0 - 5 WBC/hpf   Bacteria, UA RARE (A) NONE SEEN   Squamous Epithelial / HPF 0-5 0 - 5 /HPF   Budding Yeast PRESENT   CBC with Differential     Status: Abnormal   Collection Time: 02/08/23  9:22 AM  Result Value Ref Range   WBC 6.0 4.0 - 10.5 K/uL   RBC 2.75 (L) 3.87 - 5.11 MIL/uL   Hemoglobin 8.6 (L) 12.0 - 15.0 g/dL   HCT 42.5 (L) 95.6 - 38.7 %   MCV 92.0 80.0 - 100.0 fL   MCH 31.3 26.0 - 34.0 pg   MCHC 34.0 30.0 - 36.0 g/dL   RDW 56.4 (H) 33.2 - 95.1 %   Platelets 181 150 - 400 K/uL   nRBC 0.0 0.0 - 0.2 %   Neutrophils Relative % 72 %   Neutro Abs 4.3 1.7 - 7.7 K/uL   Lymphocytes Relative 14 %   Lymphs Abs 0.9 0.7 - 4.0 K/uL   Monocytes Relative 8 %   Monocytes Absolute 0.5 0.1 - 1.0 K/uL   Eosinophils Relative 4 %   Eosinophils Absolute 0.2 0.0 - 0.5 K/uL   Basophils Relative 1 %   Basophils Absolute 0.0 0.0 - 0.1 K/uL   Immature Granulocytes 1 %   Abs Immature Granulocytes 0.07 0.00 - 0.07 K/uL  Comprehensive metabolic panel     Status: Abnormal   Collection Time: 02/08/23  9:22 AM  Result Value Ref Range   Sodium 134 (L) 135 - 145 mmol/L   Potassium 4.8 3.5 - 5.1 mmol/L   Chloride 107 98 - 111 mmol/L   CO2 16 (L) 22 - 32 mmol/L   Glucose, Bld 229 (H) 70 - 99 mg/dL   BUN 884 (H) 6 - 20 mg/dL   Creatinine, Ser 1.66 (H) 0.44 - 1.00 mg/dL   Calcium 9.0 8.9 -  10.3 mg/dL   Total Protein 7.6 6.5 - 8.1 g/dL   Albumin 3.1 (L) 3.5 - 5.0 g/dL   AST 12 (L) 15 - 41 U/L   ALT 10 0 - 44 U/L   Alkaline Phosphatase 95 38 - 126 U/L   Total Bilirubin 0.2 (L) 0.3 - 1.2 mg/dL   GFR, Estimated 14 (L) >60 mL/min   Anion gap 11 5 - 15  Protime-INR     Status: Abnormal   Collection Time: 02/08/23  9:22 AM  Result Value Ref Range   Prothrombin Time 17.4  (H) 11.4 - 15.2 seconds   INR 1.4 (H) 0.8 - 1.2  Troponin I (High Sensitivity)     Status: None   Collection Time: 02/08/23  9:23 AM  Result Value Ref Range   Troponin I (High Sensitivity) 5 <18 ng/L  Troponin I (High Sensitivity)     Status: None   Collection Time: 02/08/23 12:49 PM  Result Value Ref Range   Troponin I (High Sensitivity) 4 <18 ng/L  Glucose, capillary     Status: Abnormal   Collection Time: 02/08/23  5:08 PM  Result Value Ref Range   Glucose-Capillary 106 (H) 70 - 99 mg/dL   Comment 1 Notify RN    Comment 2 Document in Chart    Basic Metabolic Panel: Recent Labs  Lab 02/08/23 0922  NA 134*  K 4.8  CL 107  CO2 16*  GLUCOSE 229*  BUN 104*  CREATININE 3.59*  CALCIUM 9.0   Liver Function Tests: Recent Labs  Lab 02/08/23 0922  AST 12*  ALT 10  ALKPHOS 95  BILITOT 0.2*  PROT 7.6  ALBUMIN 3.1*   No results for input(s): "LIPASE", "AMYLASE" in the last 168 hours. No results for input(s): "AMMONIA" in the last 168 hours. CBC: Recent Labs  Lab 02/08/23 0922  WBC 6.0  NEUTROABS 4.3  HGB 8.6*  HCT 25.3*  MCV 92.0  PLT 181   Cardiac Enzymes: Recent Labs  Lab 02/08/23 0923 02/08/23 1249  TROPONINIHS 5 4    BNP (last 3 results) No results for input(s): "PROBNP" in the last 8760 hours. CBG: Recent Labs  Lab 02/07/23 1136 02/08/23 1708  GLUCAP 123* 106*    Radiological Exams on Admission:  US Venous Img Lower Bilateral (DVT)  Result Date: 02/08/2023 CLINICAL DATA:  DVT (deep venous thrombosis) (HCC) . EXAM: BILATERAL LOWER EXTREMITY VENOUS DOPPLER ULTRASOUND TECHNIQUE: Gray-scale sonography with graded compression, as well as color Doppler and duplex ultrasound were performed to evaluate the lower extremity deep venous systems from the level of the common femoral vein and including the common femoral, femoral, profunda femoral, popliteal and calf veins including the posterior tibial, peroneal and gastrocnemius veins when visible. The  superficial great saphenous vein was also interrogated. Spectral Doppler was utilized to evaluate flow at rest and with distal augmentation maneuvers in the common femoral, femoral and popliteal veins. COMPARISON:  None Available. FINDINGS: RIGHT LOWER EXTREMITY Common Femoral Vein: Positive for thrombus. Saphenofemoral Junction: No evidence of thrombus. Normal compressibility and flow on color Doppler imaging. Profunda Femoral Vein: Positive for thrombus. Femoral Vein: Positive for thrombus. Popliteal Vein: Positive for thrombus. Calf Veins: Limited visualization. Other: Heterogeneous 2.7 x 4.1 x 2.3 cm mass in the upper right calf with internal vascularity LEFT LOWER EXTREMITY Common Femoral Vein: No evidence of thrombus. Normal compressibility, respiratory phasicity and response to augmentation. Saphenofemoral Junction: No evidence of thrombus. Normal compressibility and flow on color Doppler imaging. Profunda Femoral Vein:  No evidence of thrombus. Normal compressibility and flow on color Doppler imaging. Femoral Vein: Positive for thrombus. Popliteal Vein: Positive for thrombus. Calf Veins: Not well visualized. IMPRESSION: 1. Positive for DVT involving the right common femoral, profunda femoral, femoral and popliteal veins and the left femoral and popliteal veins. 2. Heterogeneous 2.7 x 4.1 x 2.3 cm mass in the upper right calf with internal vascularity. Recommend MRI to further evaluate. Electronically Signed   By: Feliberto Harts M.D.   On: 02/08/2023 14:21   CT Chest Wo Contrast  Result Date: 02/08/2023 CLINICAL DATA:  Partially visualized masslike consolidation; * Tracking Code: BO * EXAM: CT CHEST WITHOUT CONTRAST TECHNIQUE: Multidetector CT imaging of the chest was performed following the standard protocol without IV contrast. RADIATION DOSE REDUCTION: This exam was performed according to the departmental dose-optimization program which includes automated exposure control, adjustment of the mA and/or  kV according to patient size and/or use of iterative reconstruction technique. COMPARISON:  Same day CT of the abdomen and pelvis; PET-CT dated August 21, 2022; PET-CT dated February 07, 2023; CT renal stone dated February 01, 2023 FINDINGS: Cardiovascular: Normal heart size. No pericardial effusion. Normal caliber thoracic aorta with mild atherosclerotic disease. Severe coronary artery calcifications. Mediastinum/Nodes: Esophagus and thyroid are unremarkable. No enlarging lymph nodes seen in the chest. Reference right pre-vascular lymph node measuring 9 mm in short axis on series 2, image 59, unchanged. Lungs/Pleura: Bilateral pulmonary masses again seen. Left perihilar mass measures 4.7 x 4.6 cm on series 2, image 68, previously 3.8 x 2.9 cm on August 21, 2022 PET-CT. Associated increased occlusion of the left lower lobe anteromedial and lateral bronchopulmonary segments. Dominant right upper lobe mass is decreased in size, however there are new areas of peripheral nodularity. Measures 2.5 x 2.9 cm on series 2, image 39, previously 3.1 x 3.4 cm. New peripheral nodular component measuring 7 mm on series 3, image 33. Dominant right lower lobe pulmonary nodule is similar in size when compared with prior August 21, 2022 PET-CT, however there are new peripheral nodular components. Cystic lesion of the right lower lobe measuring up to 3.2 cm previously, 2.5 cm, with new peripheral nodular components, largest measures 9 mm on series 3, image 90. Nodular component are increased in size when lung bases seen on CT renal stone CT dated January 02, 2023. Additional new bilateral solid pulmonary nodules are seen. Reference solid nodule of the left lower lobe measuring 7 mm on series 3, image 76. Upper Abdomen: No acute abnormality. See same day separately dictated CT of the abdomen and pelvis for further discussion. Musculoskeletal: No chest wall mass or suspicious bone lesions identified. IMPRESSION: 1. Left perihilar masses is  increased in size when compared with August 21, 2022 PET-CT, findings are concerning for progressive disease. See recently performed PET-CT dated February 07, 2023 for further discussion. 2. Cystic lesion of the right lower lobe with nodular components, nodules are increased size when compared with portions of the lung visualized on January 02, 2023 abdomen and pelvis CT. 3. Additional new bilateral solid pulmonary nodules are seen when compared with PET-CT dated August 21, 2022. 4. No enlarging lymph nodes seen in the chest. 5. Aortic Atherosclerosis (ICD10-I70.0). Electronically Signed   By: Allegra Lai M.D.   On: 02/08/2023 11:34   CT Renal Stone Study  Result Date: 02/08/2023 CLINICAL DATA:  History of endometrial cancer with 2 day history of left flank pain associated with nausea and vomiting. * Tracking Code: BO * EXAM: CT  ABDOMEN AND PELVIS WITHOUT CONTRAST TECHNIQUE: Multidetector CT imaging of the abdomen and pelvis was performed following the standard protocol without IV contrast. RADIATION DOSE REDUCTION: This exam was performed according to the departmental dose-optimization program which includes automated exposure control, adjustment of the mA and/or kV according to patient size and/or use of iterative reconstruction technique. COMPARISON:  Nuclear medicine PET dated 02/07/2023, CT abdomen and pelvis dated 01/02/2023 FINDINGS: Lower chest: Partially imaged masslike consolidation involving the left hilum and right lower lobe are new from 01/02/2023. Medial right lower lobe nodule measuring 1.0 x 0.8 cm (4:10), previously 0.6 x 0.5 cm. Slightly inferiorly, a 6 mm subsolid nodule is new (4:15). Previously noted irregular/clustered nodule in the medial left lower lobe is no longer seen. No pleural effusion or pneumothorax demonstrated. Partially imaged heart size is normal. Coronary artery calcifications. Hepatobiliary: No focal hepatic lesions. No intra or extrahepatic biliary ductal dilation.  Cholelithiasis. Pancreas: No focal lesions or main ductal dilation. Spleen: Normal in size without focal abnormality. Adrenals/Urinary Tract: No adrenal nodules. Bilateral ureteral stents and percutaneous left nephrostomy tube in-situ, similar to IR procedure dated 01/09/2023. Similar right urothelial thickening and mild pelvic fullness. No suspicious renal mass, calculi or hydronephrosis. Urinary bladder is decompressed. Stomach/Bowel: Normal appearance of the stomach. No evidence of bowel wall thickening, distention, or inflammatory changes. Similar rectal distention containing large volume stool. Small to moderate volume stool within the remainder of the colon. Fecalization of material within the terminal ileum. Normal appendix. Vascular/Lymphatic: Aortic atherosclerosis. Infrarenal IVC filter in-situ. No enlarged abdominal or pelvic lymph nodes. Reproductive: No adnexal masses. Other: Small volume presacral edema. No free air or fluid collection. Musculoskeletal: No acute or abnormal lytic or blastic osseous lesions. Postsurgical changes of the anterior abdominal wall. IMPRESSION: 1. Partially imaged masslike consolidation involving the left hilum and right lower lobe are new from 01/02/2023 and may represent infection/inflammation or metastatic disease. 2. Medial right lower lobe nodule measuring 1.0 x 0.8 cm, previously 0.6 x 0.5 cm. Slightly inferiorly, a 6 mm subsolid nodule is new. Previously noted irregular/clustered nodule in the medial left lower lobe is no longer seen. 3. Bilateral ureteral stents and percutaneous left nephrostomy tube in-situ, similar to IR procedure dated 01/09/2023. Similar right urothelial thickening and mild pelvic fullness. 4. Similar rectal distention containing large volume stool. Small to moderate volume stool within the remainder of the colon. Fecalization of material within the terminal ileum likely reflecting slowed transit. 5. Aortic Atherosclerosis (ICD10-I70.0). Coronary  artery calcifications. Assessment for potential risk factor modification, dietary therapy or pharmacologic therapy may be warranted, if clinically indicated. Electronically Signed   By: Agustin Cree M.D.   On: 02/08/2023 09:34    EKG: Independently reviewed. NSR   Assessment and Plan: * Pulmonary emboli (HCC) Is a diagnosis of deep venous thrombosis since at least January 2023.  Patient describes having been initially on Eliquis, with progression of venous thromboembolism, subsequently changed to warfarin.  Most recently patient's warfarin therapy was entry interrupted for right subclavian port placement.  And patient now comes in with pleuritic left-sided chest pain.  Which may be due to pulmonary embolism or possible pneumonia.  Neither of those diagnosis cannot be ruled out given imaging finding of CAT scan with new worsening infiltrates.  Unfortunately, given patient's CKD, contrast could not be administered with the CAT scan.  Regardless, given the subtherapeutic INR that the patient has currently, patient has been started on IV intravenous heparin which will be continued.  I will treat the  patient with IV Unasyn and azithromycin for possible pneumonia  Leg mass See [place] finding of Heterogeneous 2.7 x 4.1 x 2.3 cm mass in the upper right calf with internal vascularity. Recommend routine outpatient MRI to further evaluate.  Abnormal urinalysis Is challenging to interpret patient urinalysis given she has had leukocytosis in the urine for a very long time.  However note that patient currently presents with flank pain.  And therefore I will follow-up the urine culture as ordered and continue with Unasyn for potential complicated UTI.  Quantify patient's proteinuria with protein creatinine ratio  Metabolic acidosis with normal anion gap and failure of bicarbonate regeneration Sodium bicarbonate PO tabs ordered  Essential (primary) hypertension Hold coreg, soft bp, low HR  Acute renal failure  superimposed on stage 3a chronic kidney disease (HCC) Patient has internal external nephrostomy to bladder stent/tube. Per patient no flushing is needed. Scheduled to be internalised on 02/21/2023 at [facility]. Patient does NOT have acute renal failure. Discussed with renal attending. No indication of dialysis yet. But advanced stage CKD. Not candidate for dialysis. See separate note.   Patinet near term prognosis is guarded, not least because of progression of her metastatic disease of endometrium, but also because of above several advanced co-morbidities. Renal attending has requested palliative care eval for advance directives and care planning. Will follow up.    Advance Care Planning:   Code Status: Full Code   Consults: palliative care, nephrology.  Family Communication: per  patient.  Severity of Illness: The appropriate patient status for this patient is INPATIENT. Inpatient status is judged to be reasonable and necessary in order to provide the required intensity of service to ensure the patient's safety. The patient's presenting symptoms, physical exam findings, and initial radiographic and laboratory data in the context of their chronic comorbidities is felt to place them at high risk for further clinical deterioration. Furthermore, it is not anticipated that the patient will be medically stable for discharge from the hospital within 2 midnights of admission.   * I certify that at the point of admission it is my clinical judgment that the patient will require inpatient hospital care spanning beyond 2 midnights from the point of admission due to high intensity of service, high risk for further deterioration and high frequency of surveillance required.*  Author: Nolberto Hanlon, MD 02/08/2023 5:32 PM  For on call review [email or web address].

## 2023-02-08 NOTE — Consult Note (Signed)
Reason for Consult: CKD stage V Referring Physician: Maryjean Ka, MD  Jaime Gomez is an 57 y.o. 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  In the ED, Temp 97.9, HR 65, Bp 104/59, SpO2 94%.  Labs notable for Hgb 8.6, Na 134, Co2 16, BUN 104, Cr 3.59, alb 3.1, INR 1.4.  CT of abdomen and pelvis without hydronephrosis.  CT of chest with enlarging perihilar masses, increased cystic lesion of the RLL, new bilateral solid pulmonary nodules.  Duplex of lower extremities revealed DVT of the right common femoral, profunda femoral, femoral and popliteal veins and left femoral and popliteal veins.  Also noted a mass in the right upper calf.  She was started on IV heparin for presumed pulmonary embolism and we were consulted due to her advanced CKD stage V.    She was seen by Dr. Thedore Mins in our office on 01/08/23 and discussed the severity and chronicity of her kidney disease.  They discussed RRT as well as palliative care.  The trend in Scr is seen below and appears to be within her baseline but the BUN has been elevated for the last 2 months.  She has also had N/V for the past 2 days as well as anorexia.  She denies any dysuria, hematuria, pyuria, urgency, frequency, or retention.   Trend in Creatinine: Creatinine, Ser  Date/Time Value Ref Range Status  02/08/2023 09:22 AM 3.59 (H) 0.44 - 1.00 mg/dL Final  24/40/1027 25:36 PM 3.74 (H) 0.44 - 1.00 mg/dL Final  64/40/3474 25:95 AM 3.37 (H) 0.44 - 1.00 mg/dL Final  63/87/5643 32:95 AM 3.32 (H) 0.44 - 1.00 mg/dL Final  18/84/1660 63:01 PM 3.14 (H) 0.57 - 1.00 mg/dL  Final  60/04/9322 55:73 AM 3.21 (H) 0.44 - 1.00 mg/dL Final  22/08/5425 06:23 AM 3.04 (H) 0.44 - 1.00 mg/dL Final  76/28/3151 76:16 AM 2.90 (H) 0.44 - 1.00 mg/dL Final  07/37/1062 69:48 AM 2.89 (H) 0.44 - 1.00 mg/dL Final  54/62/7035 00:93 AM 2.67 (H) 0.44 - 1.00 mg/dL Final  81/82/9937 16:96 PM 2.62 (H) 0.44 - 1.00 mg/dL Final  78/93/8101 75:10 AM 3.19 (H) 0.44 - 1.00 mg/dL Final  25/85/2778 24:23 AM 3.48 (H) 0.44 - 1.00 mg/dL Final  53/61/4431 54:00 AM 3.65 (H) 0.44 - 1.00 mg/dL Final  86/76/1950 93:26 AM 3.85 (H) 0.44 - 1.00 mg/dL Final  71/24/5809 98:33 AM 4.32 (H) 0.44 - 1.00 mg/dL Final  82/50/5397 67:34 AM 4.67 (H) 0.44 - 1.00 mg/dL Final  19/37/9024 09:73 AM 4.81 (H) 0.44 - 1.00 mg/dL Final  53/29/9242 68:34 AM 5.15 (H) 0.44 - 1.00 mg/dL Final  19/62/2297 98:92 AM 4.94 (H) 0.44 - 1.00 mg/dL Final  11/94/1740 81:44 AM 2.95 (H) 0.44 - 1.00 mg/dL Final  81/85/6314 97:02 AM 2.93 (H) 0.44 - 1.00 mg/dL Final  63/78/5885 02:77 PM 2.88 (H) 0.44 - 1.00 mg/dL Final  41/28/7867 67:20 PM 2.81 (H) 0.44 - 1.00 mg/dL Final  94/70/9628 36:62 AM 5.69 (H) 0.44 - 1.00 mg/dL Final  94/76/5465 03:54 AM 6.43 (H) 0.44 - 1.00 mg/dL Final  65/68/1275 17:00 AM 7.17 (H) 0.44 - 1.00 mg/dL Final  17/49/4496 75:91  AM 7.61 (H) 0.44 - 1.00 mg/dL Final  47/82/9562 13:08 AM 8.45 (H) 0.44 - 1.00 mg/dL Final   Creatinine  Date/Time Value Ref Range Status  12/15/2022 02:19 PM 3.53 (H) 0.44 - 1.00 mg/dL Final  65/78/4696 29:52 PM 2.98 (H) 0.44 - 1.00 mg/dL Final  84/13/2440 10:27 PM 4.07 (H) 0.44 - 1.00 mg/dL Final  25/36/6440 34:74 PM 3.37 (HH) 0.44 - 1.00 mg/dL Final  25/95/6387 56:43 PM 3.04 (HH) 0.44 - 1.00 mg/dL Final  32/95/1884 16:60 PM 3.13 (HH) 0.44 - 1.00 mg/dL Final  63/07/6008 93:23 AM 2.98 (H) 0.44 - 1.00 mg/dL Final  55/73/2202 54:27 PM 2.74 (H) 0.44 - 1.00 mg/dL Final  01/14/7627 31:51 PM 3.49 (HH) 0.44 - 1.00 mg/dL Final  76/16/0737 10:62 AM 3.08 (HH) 0.44 - 1.00 mg/dL Final  69/48/5462 70:35  AM 3.12 (HH) 0.44 - 1.00 mg/dL Final  00/93/8182 99:37 PM 3.46 (HH) 0.44 - 1.00 mg/dL Final  16/96/7893 81:01 AM 5.75 (HH) 0.44 - 1.00 mg/dL Final    PMH:   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 failure 08/18/2021   Diabetes mellitus without complication (HCC) type 2    Endometrial cancer, FIGO stage IVB (HCC) 08/18/2021   Goals of care, counseling/discussion 08/18/2021   History of kidney stones    Hypertension    Leg DVT (deep venous thromboembolism), chronic, right (HCC) 08/18/2021   Ovarian cancer (HCC) 2013   Pneumonia 12/03/2022   Wears glasses     PSH:   Past Surgical History:  Procedure Laterality Date   ABDOMINAL HYSTERECTOMY     BOTOX INJECTION N/A 01/12/2023   Procedure: BOTOX INJECTION INTO BLADDER;  Surgeon: Jannifer Hick, MD;  Location: [facility];  Service: Urology;  Laterality: N/A;   CYSTOSCOPY W/ RETROGRADES Right 01/12/2023   Procedure: CYSTOSCOPY WITH RIGHT  RETROGRADE PYELOGRAM/ RIGHT URETERAL STENT CHANGE;  Surgeon: Jannifer Hick, MD;  Location: [facility];  Service: Urology;  Laterality: Right;   CYSTOSCOPY W/ URETERAL STENT PLACEMENT Bilateral 09/01/2021   Procedure: CYSTOSCOPY WITH RETROGRADE PYELOGRAM/URETERAL STENT PLACEMENT;  Surgeon: Jannifer Hick, MD;  Location: [facility];  Service: Urology;  Laterality: Bilateral;   CYSTOSCOPY WITH FULGERATION N/A 01/12/2023   Procedure: CYSTOSCOPY WITH FULGERATION;  Surgeon: Jannifer Hick, MD;  Location: [facility];  Service: Urology;  Laterality: N/A;   CYSTOSCOPY WITH STENT PLACEMENT Right 05/19/2022   Procedure: CYSTOSCOPY WITH STENT CHANGE;  Surgeon: Jannifer Hick, MD;  Location: [facility];  Service: Urology;  Laterality: Right;  ONLY NEEDS 30 MIN   HERNIA REPAIR     IR CONVERT LEFT NEPHROSTOMY TO NEPHROURETERAL CATH  11/02/2021   IR CONVERT LEFT NEPHROSTOMY TO NEPHROURETERAL CATH  01/09/2023    IR CV LINE INJECTION  08/23/2021   IR DIL URETER LEFT  01/09/2023   IR IMAGING GUIDED PORT INSERTION  11/14/2021   IR IMAGING GUIDED PORT INSERTION  01/09/2023   IR NEPHROSTOMY EXCHANGE LEFT  11/02/2021   IR NEPHROSTOMY EXCHANGE LEFT  01/20/2022   IR NEPHROSTOMY EXCHANGE LEFT  11/29/2022   IR NEPHROSTOMY PLACEMENT LEFT  09/03/2021   IR REMOVAL TUN ACCESS W/ PORT W/O FL MOD SED  11/14/2021   IR REMOVAL TUN ACCESS W/ PORT W/O FL MOD SED  12/04/2022    Allergies:  Allergies  Allergen Reactions   Taxotere [Docetaxel] Swelling    Throat swelling  Medications:   Prior to Admission medications   Medication Sig Start Date End Date Taking? Authorizing Provider  acetaminophen (TYLENOL) 500 MG tablet Take 500 mg by mouth every 6 (six) hours as needed for mild pain.    [provider]  carvedilol (COREG) 12.5 MG tablet Take 1 tablet (12.5 mg total) by mouth 2 (two) times daily with a meal. 08/30/22   Ennever, Rose Phi, MD  Continuous Glucose Sensor (FREESTYLE LIBRE 3 SENSOR) MISC 1 Act by Does not apply route daily. Place 1 sensor on the skin every 14 days. Use to check glucose continuously 12/26/22   Karie Georges, MD  furosemide (LASIX) 20 MG tablet TAKE 1 TABLET BY MOUTH EVERY DAY 01/22/23   Karie Georges, MD  gabapentin (NEURONTIN) 300 MG capsule Take 1 capsule (300 mg total) by mouth 3 (three) times daily. 12/15/22   Erenest Blank, NP  insulin aspart (NOVOLOG) 100 UNIT/ML FlexPen Inject 2 Units into the skin 3 times daily with meals. 12/12/22   Marinda Elk, MD  insulin detemir (LEVEMIR) 100 UNIT/ML injection Inject 0.2 mLs (20 Units total) into the skin 2 (two) times daily. 08/30/22   [name], MD  oxybutynin (DITROPAN-XL) 10 MG 24 hr tablet Take 10 mg by mouth daily. 08/27/22   [provider]  oxycodone (OXY-IR) 5 MG capsule Take 1 capsule (5 mg total) by mouth every 4 (four) hours as needed for pain. 12/13/22   [name], MD  polyethylene glycol (MIRALAX /  GLYCOLAX) 17 g packet Take 17 g by mouth daily as needed for mild constipation. 09/05/21   Pokhrel, Rebekah Chesterfield, MD  prochlorperazine (COMPAZINE) 10 MG tablet Take 10 mg by mouth every 6 (six) hours as needed for nausea or vomiting. 08/18/21   [provider]  venlafaxine XR (EFFEXOR XR) 37.5 MG 24 hr capsule Take 1 capsule (37.5 mg total) by mouth daily with breakfast. 12/26/22   Karie Georges, MD  warfarin (COUMADIN) 5 MG tablet Take 1 tablet (5 mg) by mouth daily. 01/24/23   Karie Georges, MD    Inpatient medications:  gabapentin  300 mg Oral TID   [START ON 02/09/2023] insulin aspart  0-15 Units Subcutaneous TID WC   insulin aspart  0-5 Units Subcutaneous QHS   insulin detemir  20 Units Subcutaneous BID   [START ON 02/09/2023] oxybutynin  10 mg Oral Daily   sodium chloride flush  3 mL Intravenous Q12H    Discontinued Meds:  There are no discontinued medications.  Social History:  reports that she has never smoked. She has never used smokeless tobacco. She reports that she does not drink alcohol and does not use drugs.  Family History:   Family History  Problem Relation Age of Onset   Hyperlipidemia Mother    Heart disease Mother    Diabetes Mother    Depression Mother    COPD Mother    Arthritis Mother    Heart attack Mother    Drug abuse Father    Alcohol abuse Father    Hyperlipidemia Sister    Heart disease Sister    Diabetes Sister    Arthritis Sister    Miscarriages / [place] Sister    Depression Brother     Pertinent items are noted in HPI. Weight change:   Intake/Output Summary (Last 24 hours) at 02/08/2023 1654 Last data filed at 02/08/2023 1045 Gross per 24 hour  Intake 1015.65 ml  Output --  Net 1015.65 ml  BP 119/71 (BP Location: Left Arm)   Pulse (!) 53   Temp (!) 97.3 F (36.3 C) (Oral)   Resp 16   Ht 5\' 4"  (1.626 m)   Wt 61.2 kg   SpO2 100%   BMI 23.17 kg/m  Vitals:   02/08/23 1318 02/08/23 1500 02/08/23 1515 02/08/23 1654  BP:   117/74 (!) 120/59 119/71  Pulse:  (!) 53 (!) 44 (!) 53  Resp:  12 15 16   Temp: (!) 97.4 F (36.3 C)   (!) 97.3 F (36.3 C)  TempSrc: Oral   Oral  SpO2:  97% 97% 100%  Weight:      Height:         General appearance: fatigued, no distress, and pale Head: Normocephalic, without obvious abnormality, atraumatic Resp: clear to auscultation bilaterally Cardio: regular rate and rhythm, S1, S2 normal, no murmur, click, rub or gallop GI: soft, non-tender; bowel sounds normal; no masses,  no organomegaly Extremities: extremities normal, atraumatic, no cyanosis or edema  Labs: Basic Metabolic Panel: Recent Labs  Lab 02/08/23 0922  NA 134*  K 4.8  CL 107  CO2 16*  GLUCOSE 229*  BUN 104*  CREATININE 3.59*  ALBUMIN 3.1*  CALCIUM 9.0   Liver Function Tests: Recent Labs  Lab 02/08/23 0922  AST 12*  ALT 10  ALKPHOS 95  BILITOT 0.2*  PROT 7.6  ALBUMIN 3.1*   No results for input(s): "LIPASE", "AMYLASE" in the last 168 hours. No results for input(s): "AMMONIA" in the last 168 hours. CBC: Recent Labs  Lab 02/08/23 0922  WBC 6.0  NEUTROABS 4.3  HGB 8.6*  HCT 25.3*  MCV 92.0  PLT 181   PT/INR: @LABRCNTIP (inr:5) Cardiac Enzymes: )No results for input(s): "CKTOTAL", "CKMB", "CKMBINDEX", "TROPONINI" in the last 168 hours. CBG: Recent Labs  Lab 02/07/23 1136  GLUCAP 123*    Iron Studies: No results for input(s): "IRON", "TIBC", "TRANSFERRIN", "FERRITIN" in the last 168 hours.  Xrays/Other Studies: US Venous Img Lower Bilateral (DVT)  Result Date: 02/08/2023 CLINICAL DATA:  DVT (deep venous thrombosis) (HCC) . EXAM: BILATERAL LOWER EXTREMITY VENOUS DOPPLER ULTRASOUND TECHNIQUE: Gray-scale sonography with graded compression, as well as color Doppler and duplex ultrasound were performed to evaluate the lower extremity deep venous systems from the level of the common femoral vein and including the common femoral, femoral, profunda femoral, popliteal and calf veins including  the posterior tibial, peroneal and gastrocnemius veins when visible. The superficial great saphenous vein was also interrogated. Spectral Doppler was utilized to evaluate flow at rest and with distal augmentation maneuvers in the common femoral, femoral and popliteal veins. COMPARISON:  None Available. FINDINGS: RIGHT LOWER EXTREMITY Common Femoral Vein: Positive for thrombus. Saphenofemoral Junction: No evidence of thrombus. Normal compressibility and flow on color Doppler imaging. Profunda Femoral Vein: Positive for thrombus. Femoral Vein: Positive for thrombus. Popliteal Vein: Positive for thrombus. Calf Veins: Limited visualization. Other: Heterogeneous 2.7 x 4.1 x 2.3 cm mass in the upper right calf with internal vascularity LEFT LOWER EXTREMITY Common Femoral Vein: No evidence of thrombus. Normal compressibility, respiratory phasicity and response to augmentation. Saphenofemoral Junction: No evidence of thrombus. Normal compressibility and flow on color Doppler imaging. Profunda Femoral Vein: No evidence of thrombus. Normal compressibility and flow on color Doppler imaging. Femoral Vein: Positive for thrombus. Popliteal Vein: Positive for thrombus. Calf Veins: Not well visualized. IMPRESSION: 1. Positive for DVT involving the right common femoral, profunda femoral, femoral and popliteal veins and the left femoral and popliteal veins. 2.  Heterogeneous 2.7 x 4.1 x 2.3 cm mass in the upper right calf with internal vascularity. Recommend MRI to further evaluate. Electronically Signed   By: Feliberto Harts M.D.   On: 02/08/2023 14:21   CT Chest Wo Contrast  Result Date: 02/08/2023 CLINICAL DATA:  Partially visualized masslike consolidation; * Tracking Code: BO * EXAM: CT CHEST WITHOUT CONTRAST TECHNIQUE: Multidetector CT imaging of the chest was performed following the standard protocol without IV contrast. RADIATION DOSE REDUCTION: This exam was performed according to the departmental dose-optimization  program which includes automated exposure control, adjustment of the mA and/or kV according to patient size and/or use of iterative reconstruction technique. COMPARISON:  Same day CT of the abdomen and pelvis; PET-CT dated August 21, 2022; PET-CT dated February 07, 2023; CT renal stone dated February 01, 2023 FINDINGS: Cardiovascular: Normal heart size. No pericardial effusion. Normal caliber thoracic aorta with mild atherosclerotic disease. Severe coronary artery calcifications. Mediastinum/Nodes: Esophagus and thyroid are unremarkable. No enlarging lymph nodes seen in the chest. Reference right pre-vascular lymph node measuring 9 mm in short axis on series 2, image 59, unchanged. Lungs/Pleura: Bilateral pulmonary masses again seen. Left perihilar mass measures 4.7 x 4.6 cm on series 2, image 68, previously 3.8 x 2.9 cm on August 21, 2022 PET-CT. Associated increased occlusion of the left lower lobe anteromedial and lateral bronchopulmonary segments. Dominant right upper lobe mass is decreased in size, however there are new areas of peripheral nodularity. Measures 2.5 x 2.9 cm on series 2, image 39, previously 3.1 x 3.4 cm. New peripheral nodular component measuring 7 mm on series 3, image 33. Dominant right lower lobe pulmonary nodule is similar in size when compared with prior August 21, 2022 PET-CT, however there are new peripheral nodular components. Cystic lesion of the right lower lobe measuring up to 3.2 cm previously, 2.5 cm, with new peripheral nodular components, largest measures 9 mm on series 3, image 90. Nodular component are increased in size when lung bases seen on CT renal stone CT dated January 02, 2023. Additional new bilateral solid pulmonary nodules are seen. Reference solid nodule of the left lower lobe measuring 7 mm on series 3, image 76. Upper Abdomen: No acute abnormality. See same day separately dictated CT of the abdomen and pelvis for further discussion. Musculoskeletal: No chest wall mass or  suspicious bone lesions identified. IMPRESSION: 1. Left perihilar masses is increased in size when compared with August 21, 2022 PET-CT, findings are concerning for progressive disease. See recently performed PET-CT dated February 07, 2023 for further discussion. 2. Cystic lesion of the right lower lobe with nodular components, nodules are increased size when compared with portions of the lung visualized on January 02, 2023 abdomen and pelvis CT. 3. Additional new bilateral solid pulmonary nodules are seen when compared with PET-CT dated August 21, 2022. 4. No enlarging lymph nodes seen in the chest. 5. Aortic Atherosclerosis (ICD10-I70.0). Electronically Signed   By: Allegra Lai M.D.   On: 02/08/2023 11:34   CT Renal Stone Study  Result Date: 02/08/2023 CLINICAL DATA:  History of endometrial cancer with 2 day history of left flank pain associated with nausea and vomiting. * Tracking Code: BO * EXAM: CT ABDOMEN AND PELVIS WITHOUT CONTRAST TECHNIQUE: Multidetector CT imaging of the abdomen and pelvis was performed following the standard protocol without IV contrast. RADIATION DOSE REDUCTION: This exam was performed according to the departmental dose-optimization program which includes automated exposure control, adjustment of the mA and/or kV according to patient size  and/or use of iterative reconstruction technique. COMPARISON:  Nuclear medicine PET dated 02/07/2023, CT abdomen and pelvis dated 01/02/2023 FINDINGS: Lower chest: Partially imaged masslike consolidation involving the left hilum and right lower lobe are new from 01/02/2023. Medial right lower lobe nodule measuring 1.0 x 0.8 cm (4:10), previously 0.6 x 0.5 cm. Slightly inferiorly, a 6 mm subsolid nodule is new (4:15). Previously noted irregular/clustered nodule in the medial left lower lobe is no longer seen. No pleural effusion or pneumothorax demonstrated. Partially imaged heart size is normal. Coronary artery calcifications. Hepatobiliary: No  focal hepatic lesions. No intra or extrahepatic biliary ductal dilation. Cholelithiasis. Pancreas: No focal lesions or main ductal dilation. Spleen: Normal in size without focal abnormality. Adrenals/Urinary Tract: No adrenal nodules. Bilateral ureteral stents and percutaneous left nephrostomy tube in-situ, similar to IR procedure dated 01/09/2023. Similar right urothelial thickening and mild pelvic fullness. No suspicious renal mass, calculi or hydronephrosis. Urinary bladder is decompressed. Stomach/Bowel: Normal appearance of the stomach. No evidence of bowel wall thickening, distention, or inflammatory changes. Similar rectal distention containing large volume stool. Small to moderate volume stool within the remainder of the colon. Fecalization of material within the terminal ileum. Normal appendix. Vascular/Lymphatic: Aortic atherosclerosis. Infrarenal IVC filter in-situ. No enlarged abdominal or pelvic lymph nodes. Reproductive: No adnexal masses. Other: Small volume presacral edema. No free air or fluid collection. Musculoskeletal: No acute or abnormal lytic or blastic osseous lesions. Postsurgical changes of the anterior abdominal wall. IMPRESSION: 1. Partially imaged masslike consolidation involving the left hilum and right lower lobe are new from 01/02/2023 and may represent infection/inflammation or metastatic disease. 2. Medial right lower lobe nodule measuring 1.0 x 0.8 cm, previously 0.6 x 0.5 cm. Slightly inferiorly, a 6 mm subsolid nodule is new. Previously noted irregular/clustered nodule in the medial left lower lobe is no longer seen. 3. Bilateral ureteral stents and percutaneous left nephrostomy tube in-situ, similar to IR procedure dated 01/09/2023. Similar right urothelial thickening and mild pelvic fullness. 4. Similar rectal distention containing large volume stool. Small to moderate volume stool within the remainder of the colon. Fecalization of material within the terminal ileum likely  reflecting slowed transit. 5. Aortic Atherosclerosis (ICD10-I70.0). Coronary artery calcifications. Assessment for potential risk factor modification, dietary therapy or pharmacologic therapy may be warranted, if clinically indicated. Electronically Signed   By: Agustin Cree M.D.   On: 02/08/2023 09:34     Assessment/Plan:  CKD stage V - has history of nephrotoxic agents with chemo as well as obstructive uropathy.  No hydronephrosis on CT scan and she reports good UOP.  We discussed the severity and chronicity of her kidney disease.  We also discussed different modalities of RRT as well as palliative care.  She is not sure she wants to pursue RRT, especially in light of her progressive metastatic disease.  We also discussed her cardiomyopathy and the challenges it poses with dialysis.  She is open to Palliative Care consult in am.  BUN up likely due to N/V and poor po intake.  Will need to renally adjust dose of gabapentin since her eGFR is only 14 mL/min.   Avoid nephrotoxic medications including NSAIDs and iodinated intravenous contrast exposure unless the latter is absolutely indicated.  Preferred narcotic agents for pain control are hydromorphone, fentanyl, and methadone. Morphine should not be used. Avoid Baclofen and avoid oral sodium phosphate and magnesium citrate based laxatives / bowel preps. Continue strict Input and Output monitoring. Will monitor the patient closely with you and intervene or adjust therapy  as indicated by changes in clinical status/labs   Recurrent thromboembolic events - new bilateral DVT's and likely PE given presentation.  She is currently on heparin drip and was subtherapeutic on coumadin.  Plan per primary svc. Metastatic adenocarcinoma of endometrium - CT scan with enlarging masses, and new solid lesions of both lungs.  PET scan performed yesterday but not yet read.  Dr. Myna Hidalgo to review scans.  Currently not on chemotherapy. Anemia of malignancy and CKD stage V - has gotten  IV iron recently.  Continue to follow. No esa due to malignancy.  Transfuse prn. HTN - bp actually on low side.   DM type 2 - per primary svc Disposition - poor overall prognosis.  Will consult Palliative care.     Julien Nordmann Preston Garabedian 02/08/2023, 4:54 PM

## 2023-02-08 NOTE — Hospital Course (Addendum)
56yow PMH endometrial CA stage IV, CKD, presented with left-sided flank pain and axillary chest pain. Admitted for acute DVT, presumed PE, possible pneumonia.  Consultants Nephrology   Procedures None

## 2023-02-08 NOTE — Assessment & Plan Note (Addendum)
Hold coreg, soft bp, low HR

## 2023-02-08 NOTE — Assessment & Plan Note (Signed)
See [place] finding of Heterogeneous 2.7 x 4.1 x 2.3 cm mass in the upper right calf with internal vascularity. Recommend routine outpatient MRI to further evaluate.

## 2023-02-09 ENCOUNTER — Observation Stay (HOSPITAL_COMMUNITY): Payer: 59

## 2023-02-09 ENCOUNTER — Other Ambulatory Visit (HOSPITAL_COMMUNITY): Payer: Self-pay

## 2023-02-09 DIAGNOSIS — I82401 Acute embolism and thrombosis of unspecified deep veins of right lower extremity: Secondary | ICD-10-CM | POA: Diagnosis not present

## 2023-02-09 DIAGNOSIS — Z8673 Personal history of transient ischemic attack (TIA), and cerebral infarction without residual deficits: Secondary | ICD-10-CM | POA: Diagnosis not present

## 2023-02-09 DIAGNOSIS — I82411 Acute embolism and thrombosis of right femoral vein: Secondary | ICD-10-CM | POA: Diagnosis present

## 2023-02-09 DIAGNOSIS — Z66 Do not resuscitate: Secondary | ICD-10-CM | POA: Diagnosis not present

## 2023-02-09 DIAGNOSIS — R600 Localized edema: Secondary | ICD-10-CM | POA: Diagnosis not present

## 2023-02-09 DIAGNOSIS — I824Y3 Acute embolism and thrombosis of unspecified deep veins of proximal lower extremity, bilateral: Secondary | ICD-10-CM | POA: Diagnosis not present

## 2023-02-09 DIAGNOSIS — N390 Urinary tract infection, site not specified: Secondary | ICD-10-CM | POA: Diagnosis present

## 2023-02-09 DIAGNOSIS — Z86718 Personal history of other venous thrombosis and embolism: Secondary | ICD-10-CM | POA: Diagnosis not present

## 2023-02-09 DIAGNOSIS — Z7189 Other specified counseling: Secondary | ICD-10-CM | POA: Diagnosis not present

## 2023-02-09 DIAGNOSIS — K5900 Constipation, unspecified: Secondary | ICD-10-CM | POA: Diagnosis not present

## 2023-02-09 DIAGNOSIS — N184 Chronic kidney disease, stage 4 (severe): Secondary | ICD-10-CM | POA: Diagnosis not present

## 2023-02-09 DIAGNOSIS — I428 Other cardiomyopathies: Secondary | ICD-10-CM | POA: Diagnosis not present

## 2023-02-09 DIAGNOSIS — N179 Acute kidney failure, unspecified: Secondary | ICD-10-CM

## 2023-02-09 DIAGNOSIS — D631 Anemia in chronic kidney disease: Secondary | ICD-10-CM

## 2023-02-09 DIAGNOSIS — Z79899 Other long term (current) drug therapy: Secondary | ICD-10-CM

## 2023-02-09 DIAGNOSIS — Z888 Allergy status to other drugs, medicaments and biological substances status: Secondary | ICD-10-CM | POA: Diagnosis not present

## 2023-02-09 DIAGNOSIS — E1122 Type 2 diabetes mellitus with diabetic chronic kidney disease: Secondary | ICD-10-CM | POA: Diagnosis present

## 2023-02-09 DIAGNOSIS — R791 Abnormal coagulation profile: Secondary | ICD-10-CM | POA: Diagnosis present

## 2023-02-09 DIAGNOSIS — R2240 Localized swelling, mass and lump, unspecified lower limb: Secondary | ICD-10-CM

## 2023-02-09 DIAGNOSIS — R109 Unspecified abdominal pain: Secondary | ICD-10-CM | POA: Diagnosis not present

## 2023-02-09 DIAGNOSIS — N250 Renal osteodystrophy: Secondary | ICD-10-CM | POA: Diagnosis not present

## 2023-02-09 DIAGNOSIS — R918 Other nonspecific abnormal finding of lung field: Secondary | ICD-10-CM | POA: Diagnosis present

## 2023-02-09 DIAGNOSIS — F17210 Nicotine dependence, cigarettes, uncomplicated: Secondary | ICD-10-CM

## 2023-02-09 DIAGNOSIS — D630 Anemia in neoplastic disease: Secondary | ICD-10-CM | POA: Diagnosis present

## 2023-02-09 DIAGNOSIS — N185 Chronic kidney disease, stage 5: Secondary | ICD-10-CM | POA: Diagnosis not present

## 2023-02-09 DIAGNOSIS — R2241 Localized swelling, mass and lump, right lower limb: Secondary | ICD-10-CM | POA: Diagnosis present

## 2023-02-09 DIAGNOSIS — G893 Neoplasm related pain (acute) (chronic): Secondary | ICD-10-CM | POA: Diagnosis not present

## 2023-02-09 DIAGNOSIS — I2699 Other pulmonary embolism without acute cor pulmonale: Secondary | ICD-10-CM | POA: Diagnosis not present

## 2023-02-09 DIAGNOSIS — I120 Hypertensive chronic kidney disease with stage 5 chronic kidney disease or end stage renal disease: Secondary | ICD-10-CM | POA: Diagnosis not present

## 2023-02-09 DIAGNOSIS — Z515 Encounter for palliative care: Secondary | ICD-10-CM

## 2023-02-09 DIAGNOSIS — Z794 Long term (current) use of insulin: Secondary | ICD-10-CM | POA: Diagnosis not present

## 2023-02-09 DIAGNOSIS — C541 Malignant neoplasm of endometrium: Secondary | ICD-10-CM

## 2023-02-09 DIAGNOSIS — I824Y9 Acute embolism and thrombosis of unspecified deep veins of unspecified proximal lower extremity: Secondary | ICD-10-CM | POA: Diagnosis not present

## 2023-02-09 DIAGNOSIS — I700 Atherosclerosis of aorta: Secondary | ICD-10-CM | POA: Diagnosis present

## 2023-02-09 DIAGNOSIS — J189 Pneumonia, unspecified organism: Secondary | ICD-10-CM | POA: Diagnosis present

## 2023-02-09 DIAGNOSIS — R079 Chest pain, unspecified: Secondary | ICD-10-CM | POA: Diagnosis not present

## 2023-02-09 DIAGNOSIS — R4589 Other symptoms and signs involving emotional state: Secondary | ICD-10-CM

## 2023-02-09 DIAGNOSIS — Z8542 Personal history of malignant neoplasm of other parts of uterus: Secondary | ICD-10-CM | POA: Diagnosis not present

## 2023-02-09 DIAGNOSIS — R112 Nausea with vomiting, unspecified: Secondary | ICD-10-CM | POA: Diagnosis not present

## 2023-02-09 DIAGNOSIS — I429 Cardiomyopathy, unspecified: Secondary | ICD-10-CM | POA: Diagnosis present

## 2023-02-09 DIAGNOSIS — N186 End stage renal disease: Secondary | ICD-10-CM | POA: Diagnosis not present

## 2023-02-09 DIAGNOSIS — Z7901 Long term (current) use of anticoagulants: Secondary | ICD-10-CM | POA: Diagnosis not present

## 2023-02-09 DIAGNOSIS — E872 Acidosis, unspecified: Secondary | ICD-10-CM | POA: Diagnosis present

## 2023-02-09 LAB — CBC
HCT: 24.2 % — ABNORMAL LOW (ref 36.0–46.0)
Hemoglobin: 7.8 g/dL — ABNORMAL LOW (ref 12.0–15.0)
MCH: 31.1 pg (ref 26.0–34.0)
MCHC: 32.2 g/dL (ref 30.0–36.0)
MCV: 96.4 fL (ref 80.0–100.0)
Platelets: 181 10*3/uL (ref 150–400)
RBC: 2.51 MIL/uL — ABNORMAL LOW (ref 3.87–5.11)
RDW: 15.5 % (ref 11.5–15.5)
WBC: 6.1 10*3/uL (ref 4.0–10.5)
nRBC: 0 % (ref 0.0–0.2)

## 2023-02-09 LAB — GLUCOSE, CAPILLARY
Glucose-Capillary: 93 mg/dL (ref 70–99)
Glucose-Capillary: 101 mg/dL — ABNORMAL HIGH (ref 70–99)
Glucose-Capillary: 198 mg/dL — ABNORMAL HIGH (ref 70–99)
Glucose-Capillary: 284 mg/dL — ABNORMAL HIGH (ref 70–99)

## 2023-02-09 LAB — RENAL FUNCTION PANEL
Albumin: 2.8 g/dL — ABNORMAL LOW (ref 3.5–5.0)
Anion gap: 6 (ref 5–15)
BUN: 89 mg/dL — ABNORMAL HIGH (ref 6–20)
CO2: 15 mmol/L — ABNORMAL LOW (ref 22–32)
Calcium: 8.4 mg/dL — ABNORMAL LOW (ref 8.9–10.3)
Chloride: 116 mmol/L — ABNORMAL HIGH (ref 98–111)
Creatinine, Ser: 3.07 mg/dL — ABNORMAL HIGH (ref 0.44–1.00)
GFR, Estimated: 17 mL/min — ABNORMAL LOW (ref >60)
Glucose, Bld: 56 mg/dL — ABNORMAL LOW (ref 70–99)
Phosphorus: 5.3 mg/dL — ABNORMAL HIGH (ref 2.5–4.6)
Potassium: 4.2 mmol/L (ref 3.5–5.1)
Sodium: 137 mmol/L (ref 135–145)

## 2023-02-09 LAB — HEPARIN LEVEL (UNFRACTIONATED)
Heparin Unfractionated: 0.21 IU/mL — ABNORMAL LOW (ref 0.30–0.70)
Heparin Unfractionated: <0.1 IU/mL — ABNORMAL LOW (ref 0.30–0.70)

## 2023-02-09 LAB — ECHOCARDIOGRAM LIMITED
AR max vel: 2.91 cm2
AV Area VTI: 2.86 cm2
AV Area mean vel: 2.73 cm2
AV Mean grad: 4 mmHg
AV Peak grad: 7.6 mmHg
Ao pk vel: 1.38 m/s
Area-P 1/2: 3.63 cm2
Calc EF: 55.4 %
Height: 64 in
MV VTI: 3.4 cm2
S' Lateral: 3.4 cm
Single Plane A2C EF: 54.8 %
Single Plane A4C EF: 58.6 %
Weight: 2160 oz

## 2023-02-09 LAB — URINE CULTURE

## 2023-02-09 LAB — CULTURE, BLOOD (ROUTINE X 2)

## 2023-02-09 MED ORDER — ENOXAPARIN SODIUM 60 MG/0.6ML IJ SOSY
PREFILLED_SYRINGE | INTRAMUSCULAR | Status: AC
  Filled 2023-02-09: qty 0.6

## 2023-02-09 MED ORDER — DEXAMETHASONE 4 MG PO TABS
ORAL_TABLET | ORAL | Status: AC
  Filled 2023-02-09: qty 1

## 2023-02-09 MED ORDER — ACETAMINOPHEN 500 MG PO TABS
ORAL_TABLET | ORAL | Status: AC
  Filled 2023-02-09: qty 2

## 2023-02-09 MED ORDER — POLYETHYLENE GLYCOL 3350 17 G PO PACK
17.0000 g | PACK | Freq: Two times a day (BID) | ORAL | Status: DC
  Administered 2023-02-10: 17 g via ORAL
  Filled 2023-02-09 (×2): qty 1

## 2023-02-09 MED ORDER — SODIUM BICARBONATE 650 MG PO TABS: 650.0000 mg | ORAL_TABLET | Freq: Three times a day (TID) | ORAL | Status: DC

## 2023-02-09 MED ORDER — POLYETHYLENE GLYCOL 3350 17 G PO PACK: 17.0000 g | PACK | Freq: Every day | ORAL | Status: DC

## 2023-02-09 MED ORDER — ACETAMINOPHEN 500 MG PO TABS
1000.0000 mg | ORAL_TABLET | Freq: Three times a day (TID) | ORAL | Status: DC
  Administered 2023-02-09 – 2023-02-10 (×3): 1000 mg via ORAL
  Filled 2023-02-09 (×2): qty 2

## 2023-02-09 MED ORDER — VENLAFAXINE HCL ER 37.5 MG PO CP24
37.5000 mg | ORAL_CAPSULE | Freq: Every day | ORAL | Status: DC
  Administered 2023-02-10: 37.5 mg via ORAL
  Filled 2023-02-09: qty 1

## 2023-02-09 MED ORDER — DEXAMETHASONE 4 MG PO TABS
4.0000 mg | ORAL_TABLET | Freq: Every day | ORAL | Status: DC
  Administered 2023-02-09 – 2023-02-10 (×2): 4 mg via ORAL
  Filled 2023-02-09: qty 1

## 2023-02-09 MED ORDER — WARFARIN SODIUM 5 MG PO TABS: 7.5000 mg | ORAL_TABLET | Freq: Once | ORAL | Status: DC

## 2023-02-09 MED ORDER — AZITHROMYCIN 250 MG PO TABS
500.0000 mg | ORAL_TABLET | Freq: Every day | ORAL | Status: DC
  Administered 2023-02-09: 500 mg via ORAL
  Filled 2023-02-09: qty 2

## 2023-02-09 MED ORDER — WARFARIN - PHARMACIST DOSING INPATIENT: Freq: Every day | Status: DC

## 2023-02-09 MED ORDER — SENNA 8.6 MG PO TABS
1.0000 | ORAL_TABLET | Freq: Every day | ORAL | Status: DC
  Filled 2023-02-09: qty 1

## 2023-02-09 MED ORDER — ENOXAPARIN SODIUM 60 MG/0.6ML IJ SOSY
60.0000 mg | PREFILLED_SYRINGE | INTRAMUSCULAR | Status: DC
  Administered 2023-02-09 – 2023-02-10 (×2): 60 mg via SUBCUTANEOUS
  Filled 2023-02-09: qty 0.6

## 2023-02-09 MED ORDER — MORPHINE (PF) INJECTION FOR INHALATION 10 MG/ML
10.0000 mg | RESPIRATORY_TRACT | Status: DC | PRN
  Filled 2023-02-09: qty 1

## 2023-02-09 NOTE — Consult Note (Signed)
Consultation Note Date: 02/09/2023   Patient Name: Jaime Gomez  DOB: [birth year]  MRN: [id number]  Age / Sex: 57 y.o., female   PCP: Karie Georges, MD Referring Physician: Standley Brooking, MD  Reason for Consultation: Establishing goals of care, Non pain symptom management, and Pain control     Chief Complaint/History of Present Illness:   Patient is a 57yo F with a PMHx of metastatic endometrial cancer, CKD stage V, DM type II, hx of kidney stones, HTN, and DVT who was admitted on 02/08/23 for management of left flank pain and left chest pain pleuritic in nature. Since admission, patient has been found to have a new PE, is receiving imaging for leg mass, and has AKI on CKD stage V. Patient is followed by Dr. Myna Hidalgo, oncologist, in the outpatient setting and while being admitted. Nephrology consulted as well. Palliative medicine team consulted to assist with complex medical decision making and symptom management.  Reviewed EMR prior to presenting to bedside. Patient currently receiving gabapentin 100mg  TID and oxycodone 5mg  q4hrs prn x 2 doses for pain management.   ------------------------------------------------------------------------------------------------------------- Advance Care Planning Conversation  Pertinent diagnosis: metastatic endometrial caner, CKD stage V not on dialysis, DVT, PE, leg mass   The patient and/or family consented to a voluntary Advance Care Planning Conversation. Individuals present for the conversation: patient, patient's sister- Jaime Gomez, this palliative medicine provider   Summary of the conversation:  Presented to bedside to meet with patient, Asked permission and patient agreed to engage in conversation at this time. Patient introduced her twin sister, Jaime Gomez, who was at bedside. Introduced myself and the role of the palliative medicine team in patient's care. Able to review patient's medical journey up until this point. Patient noted she discussed  care with nephrology and Dr. Myna Hidalgo previously. She notes Dr. Myna Hidalgo is going to see if there are any appropraite cancer directed therapies for her at this time so she is awaiting his input regarding this. She noted idea of dialysis has been mentioned by providers though she really does not want to go on dialysis and noted Dr. Myna Hidalgo supported her not doing so either.   With permission, able to spend time discussing patient's wishes for medical care and quality time moving forward. Patient described that she wants to spend time with family and traveling if able. She has some friends in [place] she would like to go out there to see again. Patient knows that when the times comes for end of life care, she wants to be allowed to receive care at home.  We discussed different pathways for medical care to support these wishes. Did discuss worry that HD would tie patient to a building and not allow her to be at home and travel as she wishes. Patient acknowledged this and noted it one of the reasons she doesn't want dialysis. Instead discussed managing CKD stage V palliatively with medication and nutrition management as directed by nephrology.  Also able to discuss differences between palliative medicine and hospice. Explained ability to continue aggressive medical therapies with palliative support while hospice is for focusing on end of life care at home when no longer seeking aggressive medical interventions to prolong time. Patient and her sister inquired about further general ideas of hospice care so was able to discuss home hospice vs SNF with hospice vs inpatient hospice with the differences and similarities  btwn the three. They acknowledged this. Noted patient would want home hospice likely to have that time at home with  family and family supportive of caring for her.   Inquired about planning regarding medical directives. Patient noted she has not completed these though wants to. Offered chaplain support  during hospitalization to assist with completion and she agreed with this. Patient stats she would want her twin sister, Jaime Gomez, to be her HCPOA. She would want her other sister, Jaime Gomez, to be her primary alternate. Placed this contact information in EMR. Patient noted she would like to elect for organ donation. Patient also clearly stated that she wants to be DNR. She is willing to continue appropraite medical interventions at this time, though would want to be allowed to have a natural death if her heart stopped or she stopped breathing. Noted she and her sister have discussed this previously. Patient also stated she'd never want to be hooked up to machines since that is not quality of life to her which includes not wanting IVFs indefinitely or a feeding tube. Acknowledged this and noted would inform chaplain to assist with AD completion.  Outcome of the conversations and/or documents completed: Changing code status to DNR, continue appropraite medical interventions. Consulting chaplain to assist with AD completion.   I spent 43 minutes providing separately identifiable ACP services with the patient and/or surrogate decision maker in a voluntary, in-person conversation discussing the patient's wishes and goals as detailed in the above note.  Alvester Morin, DO Palliative Care Provider  -------------------------------------------------------------------------------------------------------------  Also able to discus patient's pain at this time. Patient describes pain as primarily being located on the L side at chest and abdomin though can radiate towards the right. Review patient's imaging on CT. Concerned about referred visceral pain from PE and cancer.  Patient noted she is a "light weight" with opioids. Discussed multimodal approach to management. Adding inhaled morphine for SOB/coughing since does not have systemic effect such as oral opioids. Patient agreeing with involving IR if able to  consider nerve block.  Patient notes she takes Miralax daily for constipation management so noted would schedule this. Last BM reported to be yesterday.   Spent time providing emotional support via active listening. Thanked patient and her sister for allowing me to visit with them today. All questions answered at that time. Noted palliative medicine team would continue to follow along with patient's medical journey.   Updated IDT after visit with patient.    Primary Diagnoses  Present on Admission:  Pulmonary emboli (HCC)  Acute renal failure superimposed on stage 3a chronic kidney disease (HCC)  Essential (primary) hypertension  Metabolic acidosis with normal anion gap and failure of bicarbonate regeneration   Palliative Review of Systems: pain  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 failure 08/18/2021   Diabetes mellitus without complication (HCC) type 2    Endometrial cancer, FIGO stage IVB (HCC) 08/18/2021   Goals of care, counseling/discussion 08/18/2021   History of kidney stones    Hypertension    Leg DVT (deep venous thromboembolism), chronic, right (HCC) 08/18/2021   Ovarian cancer (HCC) 2013   Pneumonia 12/03/2022   Wears glasses    Social History   Socioeconomic History   Marital status: Single    Spouse name: Not on file   Number of children: Not on file   Years of education: Not on file   Highest education level: Not on file  Occupational History   Not on file  Tobacco Use   Smoking status:  Never   Smokeless tobacco: Never  Vaping Use   Vaping status: Never Used  Substance and Sexual Activity   Alcohol use: Never   Drug use: Never   Sexual activity: Not Currently  Other Topics Concern   Not on file  Social History Narrative   Not on file   Social Determinants of Health   Financial Resource Strain: Low Risk  (06/27/2022)   Overall Financial Resource Strain (CARDIA)     Difficulty of Paying Living Expenses: Not hard at all  Food Insecurity: No Food Insecurity (11/28/2022)   Hunger Vital Sign    Worried About Running Out of Food in the Last Year: Never true    Ran Out of Food in the Last Year: Never true  Transportation Needs: No Transportation Needs (11/28/2022)   PRAPARE - Administrator, Civil Service (Medical): No    Lack of Transportation (Non-Medical): No  Physical Activity: Not on file  Stress: No Stress Concern Present (06/27/2022)   [employer] of Occupational Health - Occupational Stress Questionnaire    Feeling of Stress : Not at all  Social Connections: Unknown (12/06/2021)   Received from [facility]   Social Network    Social Network: Not on file   Family History  Problem Relation Age of Onset   Hyperlipidemia Mother    Heart disease Mother    Diabetes Mother    Depression Mother    COPD Mother    Arthritis Mother    Heart attack Mother    Drug abuse Father    Alcohol abuse Father    Hyperlipidemia Sister    Heart disease Sister    Diabetes Sister    Arthritis Sister    Miscarriages / [place] Sister    Depression Brother    Scheduled Meds:  Chlorhexidine Gluconate Cloth  6 each Topical Daily   docusate sodium  100 mg Oral BID   gabapentin  100 mg Oral TID   insulin aspart  0-15 Units Subcutaneous TID WC   insulin aspart  0-5 Units Subcutaneous QHS   insulin detemir  20 Units Subcutaneous BID   oxybutynin  10 mg Oral Daily   sodium bicarbonate  650 mg Oral TID   sodium chloride flush  10-40 mL Intracatheter Q12H   sodium chloride flush  3 mL Intravenous Q12H   Continuous Infusions:  ampicillin-sulbactam (UNASYN) IV 3 g (02/09/23 0757)   azithromycin 500 mg (02/08/23 2042)   heparin 900 Units/hr (02/09/23 0115)   PRN Meds:.acetaminophen **OR** acetaminophen, oxyCODONE, polyethylene glycol, prochlorperazine, sodium chloride flush Allergies  Allergen Reactions   Paclitaxel Anaphylaxis, Swelling and  Other (See Comments)    Made everything swell   Taxotere [Docetaxel] Anaphylaxis, Swelling and Other (See Comments)    Throat swelling   CBC:    Component Value Date/Time   WBC 6.1 02/09/2023 0500   HGB 7.8 (L) 02/09/2023 0500   HGB 9.0 (L) 02/01/2023 1446   HCT 24.2 (L) 02/09/2023 0500   PLT 181 02/09/2023 0500   PLT 211 02/01/2023 1446   MCV 96.4 02/09/2023 0500   NEUTROABS 4.3 02/08/2023 0922   LYMPHSABS 0.9 02/08/2023 0922   MONOABS 0.5 02/08/2023 0922   EOSABS 0.2 02/08/2023 0922   BASOSABS 0.0 02/08/2023 0922   Comprehensive Metabolic Panel:    Component Value Date/Time   NA 137 02/09/2023 0500   NA 143 01/01/2023 1236   K 4.2 02/09/2023 0500   CL 116 (H) 02/09/2023 0500   CO2 15 (L)  02/09/2023 0500   BUN 89 (H) 02/09/2023 0500   BUN 57 (H) 01/01/2023 1236   CREATININE 3.07 (H) 02/09/2023 0500   CREATININE 3.74 (H) 02/01/2023 1446   GLUCOSE 56 (L) 02/09/2023 0500   CALCIUM 8.4 (L) 02/09/2023 0500   AST 12 (L) 02/08/2023 0922   AST 9 (L) 02/01/2023 1446   ALT 10 02/08/2023 0922   ALT 5 02/01/2023 1446   ALKPHOS 95 02/08/2023 0922   BILITOT 0.2 (L) 02/08/2023 0922   BILITOT 0.4 02/01/2023 1446   PROT 7.6 02/08/2023 0922   ALBUMIN 2.8 (L) 02/09/2023 0500    Physical Exam: Vital Signs: BP 110/66 (BP Location: Right Arm)   Pulse (!) 59   Temp 97.6 F (36.4 C) (Oral)   Resp 15   Ht 5\' 4"  (1.626 m)   Wt 61.2 kg   SpO2 96%   BMI 23.17 kg/m  SpO2: SpO2: 96 % O2 Device: O2 Device: Room Air O2 Flow Rate:   Intake/output summary:  Intake/Output Summary (Last 24 hours) at 02/09/2023 1478 Last data filed at 02/08/2023 1800 Gross per 24 hour  Intake 1094.96 ml  Output --  Net 1094.96 ml   LBM: Last BM Date : 02/06/23 Baseline Weight: Weight: 61.2 kg Most recent weight: Weight: 61.2 kg  General: NAD, alert, laying in bed, pleasant  Eyes: no drainage noted HENT: moist mucous membranes Cardiovascular: RRR Respiratory: no increased work of breathing noted,  not in respiratory distress Abdomen: distended Skin: no rashes or lesions on visible skin Neuro: A&Ox4, following commands easily Psych: appropriately answers all questions          Palliative Performance Scale: 50%              Additional Data Reviewed: Recent Labs    02/08/23 0922 02/09/23 0500  WBC 6.0 6.1  HGB 8.6* 7.8*  PLT 181 181  NA 134* 137  BUN 104* 89*  CREATININE 3.59* 3.07*    Imaging: US Venous Img Lower Bilateral (DVT) CLINICAL DATA:  DVT (deep venous thrombosis) (HCC) .  EXAM: BILATERAL LOWER EXTREMITY VENOUS DOPPLER ULTRASOUND  TECHNIQUE: Gray-scale sonography with graded compression, as well as color Doppler and duplex ultrasound were performed to evaluate the lower extremity deep venous systems from the level of the common femoral vein and including the common femoral, femoral, profunda femoral, popliteal and calf veins including the posterior tibial, peroneal and gastrocnemius veins when visible. The superficial great saphenous vein was also interrogated. Spectral Doppler was utilized to evaluate flow at rest and with distal augmentation maneuvers in the common femoral, femoral and popliteal veins.  COMPARISON:  None Available.  FINDINGS: RIGHT LOWER EXTREMITY  Common Femoral Vein: Positive for thrombus.  Saphenofemoral Junction: No evidence of thrombus. Normal compressibility and flow on color Doppler imaging.  Profunda Femoral Vein: Positive for thrombus.  Femoral Vein: Positive for thrombus.  Popliteal Vein: Positive for thrombus.  Calf Veins: Limited visualization.  Other: Heterogeneous 2.7 x 4.1 x 2.3 cm mass in the upper right calf with internal vascularity  LEFT LOWER EXTREMITY  Common Femoral Vein: No evidence of thrombus. Normal compressibility, respiratory phasicity and response to augmentation.  Saphenofemoral Junction: No evidence of thrombus. Normal compressibility and flow on color Doppler imaging.  Profunda  Femoral Vein: No evidence of thrombus. Normal compressibility and flow on color Doppler imaging.  Femoral Vein: Positive for thrombus.  Popliteal Vein: Positive for thrombus.  Calf Veins: Not well visualized.  IMPRESSION: 1. Positive for DVT involving the right common femoral,  profunda femoral, femoral and popliteal veins and the left femoral and popliteal veins. 2. Heterogeneous 2.7 x 4.1 x 2.3 cm mass in the upper right calf with internal vascularity. Recommend MRI to further evaluate.  Electronically Signed   By: Feliberto Harts M.D.   On: 02/08/2023 14:21 CT Chest Wo Contrast CLINICAL DATA:  Partially visualized masslike consolidation; * Tracking Code: BO *  EXAM: CT CHEST WITHOUT CONTRAST  TECHNIQUE: Multidetector CT imaging of the chest was performed following the standard protocol without IV contrast.  RADIATION DOSE REDUCTION: This exam was performed according to the departmental dose-optimization program which includes automated exposure control, adjustment of the mA and/or kV according to patient size and/or use of iterative reconstruction technique.  COMPARISON:  Same day CT of the abdomen and pelvis; PET-CT dated August 21, 2022; PET-CT dated February 07, 2023; CT renal stone dated February 01, 2023  FINDINGS: Cardiovascular: Normal heart size. No pericardial effusion. Normal caliber thoracic aorta with mild atherosclerotic disease. Severe coronary artery calcifications.  Mediastinum/Nodes: Esophagus and thyroid are unremarkable. No enlarging lymph nodes seen in the chest. Reference right pre-vascular lymph node measuring 9 mm in short axis on series 2, image 59, unchanged.  Lungs/Pleura: Bilateral pulmonary masses again seen. Left perihilar mass measures 4.7 x 4.6 cm on series 2, image 68, previously 3.8 x 2.9 cm on August 21, 2022 PET-CT. Associated increased occlusion of the left lower lobe anteromedial and lateral bronchopulmonary segments.  Dominant  right upper lobe mass is decreased in size, however there are new areas of peripheral nodularity. Measures 2.5 x 2.9 cm on series 2, image 39, previously 3.1 x 3.4 cm. New peripheral nodular component measuring 7 mm on series 3, image 33.  Dominant right lower lobe pulmonary nodule is similar in size when compared with prior August 21, 2022 PET-CT, however there are new peripheral nodular components.  Cystic lesion of the right lower lobe measuring up to 3.2 cm previously, 2.5 cm, with new peripheral nodular components, largest measures 9 mm on series 3, image 90. Nodular component are increased in size when lung bases seen on CT renal stone CT dated January 02, 2023.  Additional new bilateral solid pulmonary nodules are seen. Reference solid nodule of the left lower lobe measuring 7 mm on series 3, image 76.  Upper Abdomen: No acute abnormality. See same day separately dictated CT of the abdomen and pelvis for further discussion.  Musculoskeletal: No chest wall mass or suspicious bone lesions identified.  IMPRESSION: 1. Left perihilar masses is increased in size when compared with August 21, 2022 PET-CT, findings are concerning for progressive disease. See recently performed PET-CT dated February 07, 2023 for further discussion. 2. Cystic lesion of the right lower lobe with nodular components, nodules are increased size when compared with portions of the lung visualized on January 02, 2023 abdomen and pelvis CT. 3. Additional new bilateral solid pulmonary nodules are seen when compared with PET-CT dated August 21, 2022. 4. No enlarging lymph nodes seen in the chest. 5. Aortic Atherosclerosis (ICD10-I70.0).  Electronically Signed   By: Allegra Lai M.D.   On: 02/08/2023 11:34 CT Renal Stone Study CLINICAL DATA:  History of endometrial cancer with 2 day history of left flank pain associated with nausea and vomiting. * Tracking Code: BO *  EXAM: CT ABDOMEN AND PELVIS WITHOUT  CONTRAST  TECHNIQUE: Multidetector CT imaging of the abdomen and pelvis was performed following the standard protocol without IV contrast.  RADIATION DOSE REDUCTION: This exam was performed  according to the departmental dose-optimization program which includes automated exposure control, adjustment of the mA and/or kV according to patient size and/or use of iterative reconstruction technique.  COMPARISON:  Nuclear medicine PET dated 02/07/2023, CT abdomen and pelvis dated 01/02/2023  FINDINGS: Lower chest: Partially imaged masslike consolidation involving the left hilum and right lower lobe are new from 01/02/2023. Medial right lower lobe nodule measuring 1.0 x 0.8 cm (4:10), previously 0.6 x 0.5 cm. Slightly inferiorly, a 6 mm subsolid nodule is new (4:15). Previously noted irregular/clustered nodule in the medial left lower lobe is no longer seen. No pleural effusion or pneumothorax demonstrated. Partially imaged heart size is normal. Coronary artery calcifications.  Hepatobiliary: No focal hepatic lesions. No intra or extrahepatic biliary ductal dilation. Cholelithiasis.  Pancreas: No focal lesions or main ductal dilation.  Spleen: Normal in size without focal abnormality.  Adrenals/Urinary Tract: No adrenal nodules. Bilateral ureteral stents and percutaneous left nephrostomy tube in-situ, similar to IR procedure dated 01/09/2023. Similar right urothelial thickening and mild pelvic fullness. No suspicious renal mass, calculi or hydronephrosis. Urinary bladder is decompressed.  Stomach/Bowel: Normal appearance of the stomach. No evidence of bowel wall thickening, distention, or inflammatory changes. Similar rectal distention containing large volume stool. Small to moderate volume stool within the remainder of the colon. Fecalization of material within the terminal ileum. Normal appendix.  Vascular/Lymphatic: Aortic atherosclerosis. Infrarenal IVC filter in-situ. No  enlarged abdominal or pelvic lymph nodes.  Reproductive: No adnexal masses.  Other: Small volume presacral edema. No free air or fluid collection.  Musculoskeletal: No acute or abnormal lytic or blastic osseous lesions. Postsurgical changes of the anterior abdominal wall.  IMPRESSION: 1. Partially imaged masslike consolidation involving the left hilum and right lower lobe are new from 01/02/2023 and may represent infection/inflammation or metastatic disease. 2. Medial right lower lobe nodule measuring 1.0 x 0.8 cm, previously 0.6 x 0.5 cm. Slightly inferiorly, a 6 mm subsolid nodule is new. Previously noted irregular/clustered nodule in the medial left lower lobe is no longer seen. 3. Bilateral ureteral stents and percutaneous left nephrostomy tube in-situ, similar to IR procedure dated 01/09/2023. Similar right urothelial thickening and mild pelvic fullness. 4. Similar rectal distention containing large volume stool. Small to moderate volume stool within the remainder of the colon. Fecalization of material within the terminal ileum likely reflecting slowed transit. 5. Aortic Atherosclerosis (ICD10-I70.0). Coronary artery calcifications. Assessment for potential risk factor modification, dietary therapy or pharmacologic therapy may be warranted, if clinically indicated.  Electronically Signed   By: Agustin Cree M.D.   On: 02/08/2023 09:34    I personally reviewed recent imaging.   Palliative Care Assessment and Plan Summary of Established Goals of Care and Medical Treatment Preferences   Patient is a 58yo F with a PMHx of metastatic endometrial cancer, CKD stage V, DM type II, hx of kidney stones, HTN, and DVT who was admitted on 02/08/23 for management of left flank pain and left chest pain pleuritic in nature. Since admission, patient has been found to have a new PE, is receiving imaging for leg mass, and has AKI on CKD stage V. Patient is followed by Dr. Myna Hidalgo, oncologist, in  the outpatient setting and while being admitted. Nephrology consulted as well. Palliative medicine team consulted to assist with complex medical decision making and symptom management.  # Complex medical decision making/goals of care  -Extensive conversation with patient while her twin sister Jaime Gomez present at bedside as detailed above in HPI. Patient agreeing with change of code  status to DNR. Wants to continue appropraite medical interventions at this time. Awaiting input from Dr. Myna Hidalgo if will be able to receive any cancer directed therapies to prolong time (knows not curative). Does not want to seek dialysis as doesn't want to be tied to machines.  -Patient wanting to complete AD naming her sister, Jaime Gomez, as her HCPOA and her other sister, Jaime Gomez, as her primary alternate. Patient would want to attempt organ donation. Patient doesn't not want feedings tube or to be on indefinite IVFs.   -Patient and sister also inquired about hospice for when patient wants to transition to focusing on symptom management only at the end of life and is no longer seeking cancer directed therapies. Explained hospice at home vs long term care vs inpatient. Patient notes she would want to be allowed to spend her time at home.   -  Code Status: DNR   # Symptom management  - Pain/SOB/coughing, in setting of metastatic endometrial cancer with blood clots   -Start dexamethasone 4mg  daily scheduled   -On gabapentin 100mg  TID. Watch dosing with renal dysfunction.   -Continue oxycodone 5mg  q4hrs prn   -Start morphine 10mg /ml inhalation q2hrs prn SOB or coughing   -Will discus with IR if patient would be possible nerve block candidate   -Constipation   -Changed Miralax to scheduled daily   -Discontinued colace     # Psycho-social/Spiritual Support:  - Support System: sisters - Desire for further Chaplain support:yes  # Discharge Planning:  To Be Determined  Thank you for allowing the palliative care  team to participate in the care Jaime Gomez.  Alvester Morin, DO Palliative Care Provider PMT # [phone]  If patient remains symptomatic despite maximum doses, please call PMT at [phone] between 0700 and 1900. Outside of these hours, please call attending, as PMT does not have night coverage.  *Please note that this is a verbal dictation therefore any spelling or grammatical errors are due to the "[employer]" system interpretation.

## 2023-02-09 NOTE — Progress Notes (Signed)
ANTICOAGULATION CONSULT NOTE - Follow Up Consult  Pharmacy Consult for heparin & warfarin Indication:  hx VTE  Allergies  Allergen Reactions   Paclitaxel Anaphylaxis, Swelling and Other (See Comments)    Made everything swell   Taxotere [Docetaxel] Anaphylaxis, Swelling and Other (See Comments)    Throat swelling    Patient Measurements: Height: 5\' 4"  (162.6 cm) Weight: 61.2 kg (135 lb) IBW/kg (Calculated) : 54.7 Heparin Dosing Weight: 61.2kg  Vital Signs: Temp: 97.6 F (36.4 C) (07/19 0552) Temp Source: Oral (07/19 0552) BP: 110/66 (07/19 0552) Pulse Rate: 59 (07/19 0552)  Labs: Recent Labs    02/08/23 0922 02/08/23 0923 02/08/23 1249 02/08/23 2120 02/09/23 0500 02/09/23 1115  HGB 8.6*  --   --   --  7.8*  --   HCT 25.3*  --   --   --  24.2*  --   PLT 181  --   --   --  181  --   LABPROT 17.4*  --   --   --   --   --   INR 1.4*  --   --   --   --   --   HEPARINUNFRC  --   --   --  0.15* 0.21* <0.10*  CREATININE 3.59*  --   --   --  3.07*  --   TROPONINIHS  --  5 4  --   --   --     Estimated Creatinine Clearance: 17.2 mL/min (A) (by C-G formula based on SCr of 3.07 mg/dL (H)).   Assessment: 29 yof presented to the ED with flank pain. She is on chronic warfarin for history of VTE but her INR is subtherapeutic at 1.4 To start IV heparin. Baseline Hgb is low at 8.6 and platelets are WNL. No bleeding noted.   02/09/23 7/18 2120 Heparin level = 0.15 (subtherapeutic) with heparin gtt @ 900 units/hr Notified by RN ~ 01:15 that patient's IV site had infiltrated and had likely been that way for an undetermined amount of time.  Bolus dose of 1500 mg had already been administered.  Advised RN to reduce heparin infusion rate back to 900 units/hr and will recheck heparin level in 8 hr - ordered for 09 am 7/19 05 am heparin level 0.21 - subtherapeutic but not steady state 7/19 09 am heparin level < 0.1 low because heparin was turned off for MRI Heparin drip off for MRI  today & resumed ~ 1020 per RN To resume coumadin today per pharmacy. PTA on coumadin 5 mg daily. Last dose 7/17 at 1800, INR on 7/18 = 1.4 Hg down 8.6> 7.8 today. PLT remains WNL. No bleeding reported by RN  Goal of Therapy:  Heparin level 0.3-0.7 units/ml Monitor platelets by anticoagulation protocol: Yes   Plan:  Heparin was off per MD for MRI. Resumed around 1020 am per RN at rate of 900 units/hr Check an 8 hr heparin level after heparin resumed> ordered for 1830 Coumadin 7.5 mg po x 1 dose today Daily heparin level and CBC and INR  Herby Abraham, Pharm.D Use secure chat for questions 02/09/2023 1:20 PM

## 2023-02-09 NOTE — Progress Notes (Signed)
  Progress Note   Patient: Jaime Gomez QQV:[id number] DOB: 04-29-1966 DOA: 02/08/2023     1 DOS: the patient was seen and examined on 02/09/2023   Brief hospital course: 56yow PMH endometrial CA stage IV, CKD, presented with left-sided flank pain and axillary chest pain. Admitted for acute DVT, presumed PE, possible pneumonia.  Consultants Nephrology   Procedures None  Assessment and Plan: * Suspected Pulmonary emboli (HCC) Acute DVT BLE Possible pneumonia  Previously treated w/ apixaban for DVT, then warfarin, recently patient's warfarin therapy was interrupted for right subclavian port placement.   CT was noncontrast secondary to renal function and thus nondiagnostic.  INR subtherapeutic.  Treating with heparin, discussed w/ PMT and Oncology, will transition to indefinite enoxaparin. Continue empiric abx  Metastatic endometrial cancer Imaging suggested metastatic disease to left and right lung as well as possible to right leg  Right leg mass Concerning for metastatic disease. Will discuss with oncology.   CKD stage IV Bilateral ureteral stents and percutaneous left nephrostomy tube Scheduled to be internalised on 02/21/2023 at [facility]. AKI ruled out.   Constipation Large volume stool on imaging Bowel regimen.    Aortic Atherosclerosis   Anemia of chronic disease Hgb stable, at baseline  Abnormal urinalysis, culture unrevealing. Treat empirically for complicated UTI  Metabolic acidosis with normal anion gap and failure of bicarbonate regeneration Sodium bicarbonate PO tabs ordered     Subjective:  Feels a million times better today Some pain in side and leg  Physical Exam: Vitals:   02/08/23 2354 02/09/23 0551 02/09/23 0552 02/09/23 1440  BP: 110/73 110/66 110/66 (!) 154/73  Pulse: 62 (!) 59 (!) 59 72  Resp: 18 15    Temp: 97.6 F (36.4 C) 97.6 F (36.4 C) 97.6 F (36.4 C) 98.4 F (36.9 C)  TempSrc: Oral Oral Oral Oral  SpO2: 96% 96% 96% 97%  Weight:       Height:       Physical Exam Vitals reviewed.  Constitutional:      General: She is not in acute distress.    Appearance: She is not ill-appearing or toxic-appearing.  Cardiovascular:     Rate and Rhythm: Normal rate and regular rhythm.     Heart sounds: No murmur heard. Pulmonary:     Effort: Pulmonary effort is normal. No respiratory distress.     Breath sounds: No wheezing, rhonchi or rales.  Neurological:     Mental Status: She is alert.  Psychiatric:        Mood and Affect: Mood normal.        Behavior: Behavior normal.    Data Reviewed: {Tip this will not be part of the note when signed- Document your independent interpretation of telemetry tracing, EKG, lab, Radiology test or any other diagnostic tests. Add any new diagnostic test ordered today.  Family Communication: twin sister at bedside  Disposition: Status is: Observation      Time spent: 35 minutes  Author: Brendia Sacks, MD 02/09/2023 5:13 PM  For on call review [email or web address].

## 2023-02-09 NOTE — Consult Note (Signed)
Jaime Gomez  is well-known to me.  She is a very nice 57 year old white female.  She has metastatic endometrial cancer.  She has been off therapy for a while.  She has had other health issues.  She has renal failure.  She has a BUN of 104 and creatinine of 3.59.  She also has history of thromboembolic disease.  She has been on Coumadin.  She apparently was admitted a day ago.  She had a CT of the chest.  She cannot have contrast.  She was found to have progressive disease from her cancer.  She did have Doppler of her legs.  She had a acute thrombus in the right leg.  However also was noted was a mass in the right lower leg.  She looks pretty good.  She is eating okay.  There is no nausea or vomiting.  I think she is still smoking a little bit.  She is currently on heparin.  Patient has chronic anemia secondary to renal insufficiency.  Her CBC shows a white cell count 6.1.  Hemoglobin 7.8.  Platelet count 181,000.  Sodium 137.  Potassium 4.2.  BUN 89 creatinine 3.07.  This actually is not that bad for her.  Albumin is 2.8.  She does have some cardiomyopathy.  We are repeating a echocardiogram on her heart.  Currently, her ejection fraction is now up to 55-60%.  She does have a Port-A-Cath in place.  She has been seen by nephrology.  They have recommended dialysis.  She really is not eager to do dialysis.  She has been seen by Palliative Care.  I have talked to her at length.  She is considering any type of intervention for her malignancy.  Thankfully, she discussed DNR with Palliative Care.  They talked her at length about this.  She does not wish to be kept alive artificially.  I told her agree with this.  I know this is a very difficult decision for her.  Found that she does have the renal insufficiency is clearly making the choices for any kind of intervention for cancer very limited.  However, given that she does have much better cardiac function now we certainly have another option that we  can consider for her.  I am just glad that she is looking better.  I am glad that she does not look all that bad right now.  We will have to talk to her about the options.  I know that she has been somewhat reluctant for chemotherapy just because of the potential side effects.  Of note, her last Ca-125 was 282.  This was back in May.  We will have to repeat this.  We will just follow her along.  We will see how everything goes.  I am glad that her renal function seem to be doing little bit better.  She did have a urine culture that was done.  We will have to see if this is positive.  I know she has had problems with UTIs in the past.   [name], MD

## 2023-02-09 NOTE — Progress Notes (Addendum)
[facility] NEPHROLOGY PROGRESS NOTE  Assessment/ Plan:  # CKD stage V - has history of nephrotoxic agents with chemo as well as obstructive uropathy.  No hydronephrosis on CT scan and she reports good UOP. -serum creatinine level trending down to 3.07 which is close to her baseline. No sign of symptoms of uremia. Add sodium bicarbonate for metabolic acidosis. Given advanced malignancy, cardiomyopathy she may not do well with dialysis. Fortunately there is no need for HD. Palliative care was consulted to discuss GOC. -Avoid nephrotoxic medications including NSAIDs and iodinated intravenous contrast exposure unless the latter is absolutely indicated.  Preferred narcotic agents for pain control are hydromorphone,  Continue strict Input and Output monitoring.   # Recurrent thromboembolic events - new bilateral DVT's and likely PE given presentation.  She is currently on heparin drip and was subtherapeutic on coumadin.  Plan per primary svc.  #Metastatic adenocarcinoma of endometrium - CT scan with enlarging masses, and new solid lesions of both lungs.  PET scan performed.  Follows with Dr. Myna Hidalgo and discussion about chemo as OP per pt.   # Anemia of malignancy and CKD stage V - has gotten IV iron recently.  No esa due to malignancy.  Transfuse prn.  # HTN - BP ok, not on anti-hypertensives.  # Metabolic acidosis: PO sodium bicarbonate.  Sign off, follow with Dr. Thedore Mins at [facility] after discharge. Please call with questions.  Subjective:  Seen and examined. Denies N/V/CP/SOB. Her sister was presented with her. Able to eat.No new event overnight. Objective Vital signs in last 24 hours: Vitals:   02/08/23 1918 02/08/23 2354 02/09/23 0551 02/09/23 0552  BP: 133/67 110/73 110/66 110/66  Pulse: (!) 51 62 (!) 59 (!) 59  Resp: 18 18 15    Temp: (!) 97.4 F (36.3 C) 97.6 F (36.4 C) 97.6 F (36.4 C) 97.6 F (36.4 C)  TempSrc: Oral Oral Oral Oral  SpO2: 98% 96% 96% 96%  Weight:       Height:       Weight change:   Intake/Output Summary (Last 24 hours) at 02/09/2023 0849 Last data filed at 02/08/2023 1800 Gross per 24 hour  Intake 1094.96 ml  Output --  Net 1094.96 ml       Labs: RENAL PANEL Recent Labs  Lab 02/08/23 0922 02/09/23 0500  NA 134* 137  K 4.8 4.2  CL 107 116*  CO2 16* 15*  GLUCOSE 229* 56*  BUN 104* 89*  CREATININE 3.59* 3.07*  CALCIUM 9.0 8.4*  PHOS  --  5.3*  ALBUMIN 3.1* 2.8*    Liver Function Tests: Recent Labs  Lab 02/08/23 0922 02/09/23 0500  AST 12*  --   ALT 10  --   ALKPHOS 95  --   BILITOT 0.2*  --   PROT 7.6  --   ALBUMIN 3.1* 2.8*   No results for input(s): "LIPASE", "AMYLASE" in the last 168 hours. No results for input(s): "AMMONIA" in the last 168 hours. CBC: Recent Labs    03/03/22 1142 04/03/22 1502 04/21/22 1858 04/27/22 1530 05/05/22 0936 08/30/22 1230 10/11/22 1329 10/11/22 1330 10/11/22 1331 11/16/22 1504 11/16/22 1505 11/22/22 1153 12/03/22 1335 12/04/22 0233 01/02/23 1142 01/09/23 0800 02/01/23 1446 02/08/23 0922 02/09/23 0500  HGB  --  5.2*   < > 6.9*   < > 5.8* 7.9*  --   --  5.0*  --    < >  --    < > 8.7* 8.5* 9.0* 8.6* 7.8*  MCV  --  86.6   < > 91.4   < > 87.2 82.4  --   --  81.7  --    < >  --    < > 96.2 96.1 96.3 92.0 96.4  VITAMINB12  --   --   --   --   --   --   --   --   --   --   --   --  1,055*  --   --   --   --   --   --   FOLATE  --   --   --   --   --   --   --   --   --   --   --   --  28.4  --   --   --   --   --   --   FERRITIN 1,455* 1,461*  --  1,481*  --  1,727*  --   --  3,448*  --  1,812*  --   --   --   --   --   --   --   --   TIBC  --  211*  --  209*  --  203* 178*  --   --  211*  --   --   --   --   --   --   --   --   --   IRON  --  52  --  25*  --  26* 29  --   --  28  --   --   --   --   --   --   --   --   --   RETICCTPCT 1.4 3.7*  --  2.3  --  1.1  --  0.7  --   --   --   --   --   --   --   --   --   --   --    < > = values in this interval not  displayed.    Cardiac Enzymes: No results for input(s): "CKTOTAL", "CKMB", "CKMBINDEX", "TROPONINI" in the last 168 hours. CBG: Recent Labs  Lab 02/07/23 1136 02/08/23 1708 02/08/23 2118 02/09/23 0816  GLUCAP 123* 106* 150* 93    Iron Studies: No results for input(s): "IRON", "TIBC", "TRANSFERRIN", "FERRITIN" in the last 72 hours. Studies/Results: US Venous Img Lower Bilateral (DVT)  Result Date: 02/08/2023 CLINICAL DATA:  DVT (deep venous thrombosis) (HCC) . EXAM: BILATERAL LOWER EXTREMITY VENOUS DOPPLER ULTRASOUND TECHNIQUE: Gray-scale sonography with graded compression, as well as color Doppler and duplex ultrasound were performed to evaluate the lower extremity deep venous systems from the level of the common femoral vein and including the common femoral, femoral, profunda femoral, popliteal and calf veins including the posterior tibial, peroneal and gastrocnemius veins when visible. The superficial great saphenous vein was also interrogated. Spectral Doppler was utilized to evaluate flow at rest and with distal augmentation maneuvers in the common femoral, femoral and popliteal veins. COMPARISON:  None Available. FINDINGS: RIGHT LOWER EXTREMITY Common Femoral Vein: Positive for thrombus. Saphenofemoral Junction: No evidence of thrombus. Normal compressibility and flow on color Doppler imaging. Profunda Femoral Vein: Positive for thrombus. Femoral Vein: Positive for thrombus. Popliteal Vein: Positive for thrombus. Calf Veins: Limited visualization. Other: Heterogeneous 2.7 x 4.1 x 2.3 cm mass in the upper right calf with internal vascularity LEFT LOWER EXTREMITY Common Femoral Vein: No evidence of thrombus. Normal  compressibility, respiratory phasicity and response to augmentation. Saphenofemoral Junction: No evidence of thrombus. Normal compressibility and flow on color Doppler imaging. Profunda Femoral Vein: No evidence of thrombus. Normal compressibility and flow on color Doppler imaging.  Femoral Vein: Positive for thrombus. Popliteal Vein: Positive for thrombus. Calf Veins: Not well visualized. IMPRESSION: 1. Positive for DVT involving the right common femoral, profunda femoral, femoral and popliteal veins and the left femoral and popliteal veins. 2. Heterogeneous 2.7 x 4.1 x 2.3 cm mass in the upper right calf with internal vascularity. Recommend MRI to further evaluate. Electronically Signed   By: Feliberto Harts M.D.   On: 02/08/2023 14:21   CT Chest Wo Contrast  Result Date: 02/08/2023 CLINICAL DATA:  Partially visualized masslike consolidation; * Tracking Code: BO * EXAM: CT CHEST WITHOUT CONTRAST TECHNIQUE: Multidetector CT imaging of the chest was performed following the standard protocol without IV contrast. RADIATION DOSE REDUCTION: This exam was performed according to the departmental dose-optimization program which includes automated exposure control, adjustment of the mA and/or kV according to patient size and/or use of iterative reconstruction technique. COMPARISON:  Same day CT of the abdomen and pelvis; PET-CT dated August 21, 2022; PET-CT dated February 07, 2023; CT renal stone dated February 01, 2023 FINDINGS: Cardiovascular: Normal heart size. No pericardial effusion. Normal caliber thoracic aorta with mild atherosclerotic disease. Severe coronary artery calcifications. Mediastinum/Nodes: Esophagus and thyroid are unremarkable. No enlarging lymph nodes seen in the chest. Reference right pre-vascular lymph node measuring 9 mm in short axis on series 2, image 59, unchanged. Lungs/Pleura: Bilateral pulmonary masses again seen. Left perihilar mass measures 4.7 x 4.6 cm on series 2, image 68, previously 3.8 x 2.9 cm on August 21, 2022 PET-CT. Associated increased occlusion of the left lower lobe anteromedial and lateral bronchopulmonary segments. Dominant right upper lobe mass is decreased in size, however there are new areas of peripheral nodularity. Measures 2.5 x 2.9 cm on series 2,  image 39, previously 3.1 x 3.4 cm. New peripheral nodular component measuring 7 mm on series 3, image 33. Dominant right lower lobe pulmonary nodule is similar in size when compared with prior August 21, 2022 PET-CT, however there are new peripheral nodular components. Cystic lesion of the right lower lobe measuring up to 3.2 cm previously, 2.5 cm, with new peripheral nodular components, largest measures 9 mm on series 3, image 90. Nodular component are increased in size when lung bases seen on CT renal stone CT dated January 02, 2023. Additional new bilateral solid pulmonary nodules are seen. Reference solid nodule of the left lower lobe measuring 7 mm on series 3, image 76. Upper Abdomen: No acute abnormality. See same day separately dictated CT of the abdomen and pelvis for further discussion. Musculoskeletal: No chest wall mass or suspicious bone lesions identified. IMPRESSION: 1. Left perihilar masses is increased in size when compared with August 21, 2022 PET-CT, findings are concerning for progressive disease. See recently performed PET-CT dated February 07, 2023 for further discussion. 2. Cystic lesion of the right lower lobe with nodular components, nodules are increased size when compared with portions of the lung visualized on January 02, 2023 abdomen and pelvis CT. 3. Additional new bilateral solid pulmonary nodules are seen when compared with PET-CT dated August 21, 2022. 4. No enlarging lymph nodes seen in the chest. 5. Aortic Atherosclerosis (ICD10-I70.0). Electronically Signed   By: Allegra Lai M.D.   On: 02/08/2023 11:34   CT Renal Stone Study  Result Date: 02/08/2023 CLINICAL DATA:  History of endometrial cancer with 2 day history of left flank pain associated with nausea and vomiting. * Tracking Code: BO * EXAM: CT ABDOMEN AND PELVIS WITHOUT CONTRAST TECHNIQUE: Multidetector CT imaging of the abdomen and pelvis was performed following the standard protocol without IV contrast. RADIATION DOSE  REDUCTION: This exam was performed according to the departmental dose-optimization program which includes automated exposure control, adjustment of the mA and/or kV according to patient size and/or use of iterative reconstruction technique. COMPARISON:  Nuclear medicine PET dated 02/07/2023, CT abdomen and pelvis dated 01/02/2023 FINDINGS: Lower chest: Partially imaged masslike consolidation involving the left hilum and right lower lobe are new from 01/02/2023. Medial right lower lobe nodule measuring 1.0 x 0.8 cm (4:10), previously 0.6 x 0.5 cm. Slightly inferiorly, a 6 mm subsolid nodule is new (4:15). Previously noted irregular/clustered nodule in the medial left lower lobe is no longer seen. No pleural effusion or pneumothorax demonstrated. Partially imaged heart size is normal. Coronary artery calcifications. Hepatobiliary: No focal hepatic lesions. No intra or extrahepatic biliary ductal dilation. Cholelithiasis. Pancreas: No focal lesions or main ductal dilation. Spleen: Normal in size without focal abnormality. Adrenals/Urinary Tract: No adrenal nodules. Bilateral ureteral stents and percutaneous left nephrostomy tube in-situ, similar to IR procedure dated 01/09/2023. Similar right urothelial thickening and mild pelvic fullness. No suspicious renal mass, calculi or hydronephrosis. Urinary bladder is decompressed. Stomach/Bowel: Normal appearance of the stomach. No evidence of bowel wall thickening, distention, or inflammatory changes. Similar rectal distention containing large volume stool. Small to moderate volume stool within the remainder of the colon. Fecalization of material within the terminal ileum. Normal appendix. Vascular/Lymphatic: Aortic atherosclerosis. Infrarenal IVC filter in-situ. No enlarged abdominal or pelvic lymph nodes. Reproductive: No adnexal masses. Other: Small volume presacral edema. No free air or fluid collection. Musculoskeletal: No acute or abnormal lytic or blastic osseous  lesions. Postsurgical changes of the anterior abdominal wall. IMPRESSION: 1. Partially imaged masslike consolidation involving the left hilum and right lower lobe are new from 01/02/2023 and may represent infection/inflammation or metastatic disease. 2. Medial right lower lobe nodule measuring 1.0 x 0.8 cm, previously 0.6 x 0.5 cm. Slightly inferiorly, a 6 mm subsolid nodule is new. Previously noted irregular/clustered nodule in the medial left lower lobe is no longer seen. 3. Bilateral ureteral stents and percutaneous left nephrostomy tube in-situ, similar to IR procedure dated 01/09/2023. Similar right urothelial thickening and mild pelvic fullness. 4. Similar rectal distention containing large volume stool. Small to moderate volume stool within the remainder of the colon. Fecalization of material within the terminal ileum likely reflecting slowed transit. 5. Aortic Atherosclerosis (ICD10-I70.0). Coronary artery calcifications. Assessment for potential risk factor modification, dietary therapy or pharmacologic therapy may be warranted, if clinically indicated. Electronically Signed   By: Agustin Cree M.D.   On: 02/08/2023 09:34    Medications: Infusions:  ampicillin-sulbactam (UNASYN) IV 3 g (02/09/23 0757)   azithromycin 500 mg (02/08/23 2042)   heparin 900 Units/hr (02/09/23 0115)    Scheduled Medications:  Chlorhexidine Gluconate Cloth  6 each Topical Daily   docusate sodium  100 mg Oral BID   gabapentin  100 mg Oral TID   insulin aspart  0-15 Units Subcutaneous TID WC   insulin aspart  0-5 Units Subcutaneous QHS   insulin detemir  20 Units Subcutaneous BID   oxybutynin  10 mg Oral Daily   sodium bicarbonate  650 mg Oral TID   sodium chloride flush  10-40 mL Intracatheter Q12H   sodium chloride flush  3 mL Intravenous Q12H    have reviewed scheduled and prn medications.  Physical Exam: General:NAD, comfortable Heart:RRR, s1s2 nl Lungs:clear b/l, no crackle Abdomen:soft, Non-tender,  non-distended Extremities:No edema Neurology: alert, awake   Marlo Arriola Jaynie Collins 02/09/2023,8:49 AM  LOS: 1 day

## 2023-02-09 NOTE — TOC Benefit Eligibility Note (Signed)
Pharmacy Patient Advocate Encounter  Insurance verification completed.    The patient is insured through [employer] test claim for enoxaparin (Lovenox) 60 mg/0.6 ml and the current 30 day co-pay is $0.00.   This test claim was processed through [facility]- copay amounts may vary at other pharmacies due to pharmacy/plan contracts, or as the patient moves through the different stages of their insurance plan.    Jaime Gomez, CPHT Pharmacy Patient Advocate Specialist [facility] Patient Advocate Team Direct Number: [phone]  Fax: [phone]

## 2023-02-09 NOTE — Progress Notes (Addendum)
ANTICOAGULATION CONSULT NOTE - Initial Consult  Pharmacy Consult for Lovenox Indication:  h/o VTE  Allergies  Allergen Reactions   Paclitaxel Anaphylaxis, Swelling and Other (See Comments)    Made everything swell   Taxotere [Docetaxel] Anaphylaxis, Swelling and Other (See Comments)    Throat swelling    Patient Measurements: Height: 5\' 4"  (162.6 cm) Weight: 61.2 kg (135 lb) IBW/kg (Calculated) : 54.7  Vital Signs: Temp: 97.6 F (36.4 C) (07/19 0552) Temp Source: Oral (07/19 0552) BP: 110/66 (07/19 0552) Pulse Rate: 59 (07/19 0552)  Labs: Recent Labs    02/08/23 0922 02/08/23 0923 02/08/23 1249 02/08/23 2120 02/09/23 0500 02/09/23 1115  HGB 8.6*  --   --   --  7.8*  --   HCT 25.3*  --   --   --  24.2*  --   PLT 181  --   --   --  181  --   LABPROT 17.4*  --   --   --   --   --   INR 1.4*  --   --   --   --   --   HEPARINUNFRC  --   --   --  0.15* 0.21* <0.10*  CREATININE 3.59*  --   --   --  3.07*  --   TROPONINIHS  --  5 4  --   --   --     Estimated Creatinine Clearance: 17.2 mL/min (A) (by C-G formula based on SCr of 3.07 mg/dL (H)).   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 Initially on Eliquis with progression of venous thromboembolism>>subsequently changed to warfarin. 7/19 hep off for MRI Warf per Rx 7/19> ok to change to Lovenox Hg 8.6>7.  Goal of Therapy:  Anti-Xa level 0.6-1 units/ml 4hrs  after LMWH dose given Monitor platelets by anticoagulation protocol: Yes   Plan:  D/c IV heparin Start Lovenox renally adjusted to 60mg  SQ q24h Warfarin d/c'd  Nahzir Pohle S. Merilynn Finland, PharmD, BCPS Clinical Staff Pharmacist [email or web address] Merilynn Finland, Rolande Moe Stillinger 02/09/2023,1:47 PM

## 2023-02-10 ENCOUNTER — Encounter: Payer: Self-pay | Admitting: Family

## 2023-02-10 ENCOUNTER — Other Ambulatory Visit (HOSPITAL_COMMUNITY): Payer: Self-pay

## 2023-02-10 DIAGNOSIS — Z66 Do not resuscitate: Secondary | ICD-10-CM | POA: Diagnosis not present

## 2023-02-10 DIAGNOSIS — K5900 Constipation, unspecified: Secondary | ICD-10-CM | POA: Diagnosis not present

## 2023-02-10 DIAGNOSIS — C541 Malignant neoplasm of endometrium: Secondary | ICD-10-CM | POA: Diagnosis not present

## 2023-02-10 DIAGNOSIS — R2240 Localized swelling, mass and lump, unspecified lower limb: Secondary | ICD-10-CM | POA: Diagnosis not present

## 2023-02-10 DIAGNOSIS — I2699 Other pulmonary embolism without acute cor pulmonale: Secondary | ICD-10-CM | POA: Diagnosis not present

## 2023-02-10 DIAGNOSIS — I824Y3 Acute embolism and thrombosis of unspecified deep veins of proximal lower extremity, bilateral: Secondary | ICD-10-CM | POA: Diagnosis not present

## 2023-02-10 DIAGNOSIS — G893 Neoplasm related pain (acute) (chronic): Secondary | ICD-10-CM

## 2023-02-10 DIAGNOSIS — N179 Acute kidney failure, unspecified: Secondary | ICD-10-CM | POA: Diagnosis not present

## 2023-02-10 DIAGNOSIS — Z7189 Other specified counseling: Secondary | ICD-10-CM | POA: Diagnosis not present

## 2023-02-10 DIAGNOSIS — Z79899 Other long term (current) drug therapy: Secondary | ICD-10-CM | POA: Diagnosis not present

## 2023-02-10 DIAGNOSIS — I824Y9 Acute embolism and thrombosis of unspecified deep veins of unspecified proximal lower extremity: Secondary | ICD-10-CM | POA: Diagnosis not present

## 2023-02-10 DIAGNOSIS — Z515 Encounter for palliative care: Secondary | ICD-10-CM | POA: Diagnosis not present

## 2023-02-10 DIAGNOSIS — N184 Chronic kidney disease, stage 4 (severe): Secondary | ICD-10-CM

## 2023-02-10 LAB — CBC WITH DIFFERENTIAL/PLATELET
Abs Immature Granulocytes: 0.05 10*3/uL (ref 0.00–0.07)
Basophils Absolute: 0 10*3/uL (ref 0.0–0.1)
Basophils Relative: 0 %
Eosinophils Absolute: 0 10*3/uL (ref 0.0–0.5)
Eosinophils Relative: 0 %
HCT: 23.7 % — ABNORMAL LOW (ref 36.0–46.0)
Hemoglobin: 7.6 g/dL — ABNORMAL LOW (ref 12.0–15.0)
Immature Granulocytes: 1 %
Lymphocytes Relative: 9 %
Lymphs Abs: 0.5 10*3/uL — ABNORMAL LOW (ref 0.7–4.0)
MCH: 30.5 pg (ref 26.0–34.0)
MCHC: 32.1 g/dL (ref 30.0–36.0)
MCV: 95.2 fL (ref 80.0–100.0)
Monocytes Absolute: 0.2 10*3/uL (ref 0.1–1.0)
Monocytes Relative: 4 %
Neutro Abs: 4.5 10*3/uL (ref 1.7–7.7)
Neutrophils Relative %: 86 %
Platelets: 170 10*3/uL (ref 150–400)
RBC: 2.49 MIL/uL — ABNORMAL LOW (ref 3.87–5.11)
RDW: 15.3 % (ref 11.5–15.5)
WBC: 5.2 10*3/uL (ref 4.0–10.5)
nRBC: 0 % (ref 0.0–0.2)

## 2023-02-10 LAB — RENAL FUNCTION PANEL
Albumin: 2.8 g/dL — ABNORMAL LOW (ref 3.5–5.0)
Anion gap: 10 (ref 5–15)
BUN: 83 mg/dL — ABNORMAL HIGH (ref 6–20)
CO2: 17 mmol/L — ABNORMAL LOW (ref 22–32)
Calcium: 9 mg/dL (ref 8.9–10.3)
Chloride: 113 mmol/L — ABNORMAL HIGH (ref 98–111)
Creatinine, Ser: 2.83 mg/dL — ABNORMAL HIGH (ref 0.44–1.00)
GFR, Estimated: 19 mL/min — ABNORMAL LOW (ref >60)
Glucose, Bld: 176 mg/dL — ABNORMAL HIGH (ref 70–99)
Phosphorus: 4.4 mg/dL (ref 2.5–4.6)
Potassium: 5 mmol/L (ref 3.5–5.1)
Sodium: 140 mmol/L (ref 135–145)

## 2023-02-10 LAB — GLUCOSE, CAPILLARY
Glucose-Capillary: 150 mg/dL — ABNORMAL HIGH (ref 70–99)
Glucose-Capillary: 109 mg/dL — ABNORMAL HIGH (ref 70–99)
Glucose-Capillary: 128 mg/dL — ABNORMAL HIGH (ref 70–99)

## 2023-02-10 LAB — PREALBUMIN: Prealbumin: 15 mg/dL — ABNORMAL LOW (ref 18–38)

## 2023-02-10 LAB — BPAM RBC
Blood Product Expiration Date: 202408042359
Unit Type and Rh: 6200

## 2023-02-10 LAB — CULTURE, BLOOD (ROUTINE X 2)
Culture: NO GROWTH
Special Requests: ADEQUATE
Special Requests: ADEQUATE

## 2023-02-10 LAB — TYPE AND SCREEN: Donor AG Type: NEGATIVE

## 2023-02-10 LAB — PREPARE RBC (CROSSMATCH)

## 2023-02-10 MED ORDER — HEPARIN SOD (PORK) LOCK FLUSH 100 UNIT/ML IV SOLN
500.0000 [IU] | INTRAVENOUS | Status: AC | PRN
  Administered 2023-02-10: 500 [IU]

## 2023-02-10 MED ORDER — DEXAMETHASONE 4 MG PO TABS
4.0000 mg | ORAL_TABLET | Freq: Every day | ORAL | 0 refills | Status: DC
  Filled 2023-02-10: qty 30, 30d supply, fill #0

## 2023-02-10 MED ORDER — SODIUM BICARBONATE 650 MG PO TABS
650.0000 mg | ORAL_TABLET | Freq: Three times a day (TID) | ORAL | 2 refills | Status: DC
  Filled 2023-02-10: qty 90, 30d supply, fill #0

## 2023-02-10 MED ORDER — AMOXICILLIN-POT CLAVULANATE 875-125 MG PO TABS
1.0000 | ORAL_TABLET | Freq: Two times a day (BID) | ORAL | 0 refills | Status: DC
  Filled 2023-02-10: qty 6, 3d supply, fill #0

## 2023-02-10 MED ORDER — FUROSEMIDE 10 MG/ML IJ SOLN
40.0000 mg | Freq: Once | INTRAMUSCULAR | Status: DC
  Filled 2023-02-10: qty 4

## 2023-02-10 MED ORDER — POLYETHYLENE GLYCOL 3350 17 G PO PACK: 17.0000 g | PACK | Freq: Two times a day (BID) | ORAL | Status: DC

## 2023-02-10 MED ORDER — ENOXAPARIN SODIUM 60 MG/0.6ML IJ SOSY
60.0000 mg | PREFILLED_SYRINGE | INTRAMUSCULAR | 2 refills | Status: AC
  Filled 2023-02-10: qty 10.8, 18d supply, fill #0

## 2023-02-10 MED ORDER — SODIUM CHLORIDE 0.9% IV SOLUTION: Freq: Once | INTRAVENOUS | Status: AC

## 2023-02-10 NOTE — Progress Notes (Signed)
[name] is feeling a whole lot better.  She wants to go home.  I really think she could go home.  Her renal function is better.  She had echocardiogram which showed that her ejection fraction was 55-60%.  She did have an MRI of the left lower leg.  There is a mass that appears to be situated near the fibula.  This probably will need to be biopsied.  However, we can do this as an outpatient.  I do think that she is going to need 1 unit of blood.  Her hemoglobin is 7.6.  She is on Lovenox.  She can be on Lovenox as an outpatient.  I really do think that given the improvement of her status, that we could give her systemic chemotherapy.  Now that her cardiac function is better, we can probably utilize an Adriamycin based protocol.  I think this would be effective.  I am still awaiting the CEA-125.  She has had a good appetite.  There is no nausea or vomiting.  Urine culture showed multiple species.  She has had no fever.  She has had no chest pain.  There is no cough.  Her sodium is 140.  Potassium 5.0.  BUN 83 creatinine 2.83.  Calcium 9 with an albumin of 2.8.  Her white cell count is 5.2.  Hemoglobin 7.6.  Platelet count 170,000.  Her temperature is 98.1.  Pulse 58.  Blood pressure 131/75.  Her lungs sound clear bilaterally.  She has good breath sounds bilaterally.  Cardiac exam regular rate and rhythm.  She has no murmurs.  Abdomen is soft.  Bowel sounds are present.  She has no fluid wave.  There is no palpable liver or spleen tip.  Extremities shows no clubbing, cyanosis or edema.  She does have little bit of tenderness in the left lower leg.  Neurological exam is nonfocal.  Again, I really think that Jaime Gomez can go home.  She really would like to go home.  I think this would be very reasonable to think about for her.  Again, I do think 1 unit of blood would be helpful.  She will need to have Lovenox at home.  We need to make sure that she can get this.  I can follow her up in the  office easily enough.  I can talk to her again about systemic therapy.   It is apparent that she had incredible care from everybody up on 5 E.   [name], MD  Franky Macho 1:37

## 2023-02-10 NOTE — Progress Notes (Signed)
   02/10/23 0927  TOC Brief Assessment  Insurance and Status Reviewed  Patient has primary care physician Yes  Home environment has been reviewed Home alone  Prior level of function: Indendent/modified independent  Prior/Current Home Services No current home services  Social Determinants of Health Reivew SDOH reviewed no interventions necessary  Readmission risk has been reviewed Yes  Transition of care needs no transition of care needs at this time

## 2023-02-10 NOTE — Discharge Summary (Signed)
Physician Discharge Summary   Patient: Jaime Gomez MRN: [id number] DOB: 1966/04/17  Admit date:     02/08/2023  Discharge date: 02/10/23  Discharge Physician: Brendia Sacks   PCP: Karie Georges, MD   Recommendations at discharge:  Follow-up leg mass, metastatic endometrial cancer. Follow-up pain control Follow-up CKD  Discharge Diagnoses:  Principal problem  * Suspected Pulmonary emboli (HCC)  Acute DVT BLE Possible pneumonia  Metastatic endometrial cancer Right leg mass CKD stage IV Bilateral ureteral stents and percutaneous left nephrostomy tube Constipation Aortic Atherosclerosis  Anemia of chronic disease UTI Metabolic acidosis with normal anion gap and failure of bicarbonate regeneration   Resolved Problems:   * No resolved hospital problems. *  Hospital Course: 56yow PMH endometrial CA stage IV, CKD, presented with left-sided flank pain and axillary chest pain. Admitted for acute DVT, presumed PE, possible pneumonia.  Treated with empiric antibiotics, anticoagulation changed to enoxaparin.  Pain control improved.  Discharged home in good condition.  * Suspected Pulmonary emboli (HCC) Acute DVT BLE Possible pneumonia  Previously treated w/ apixaban for DVT, then warfarin, recently patient's warfarin therapy was interrupted for right subclavian port placement.   CT was noncontrast secondary to renal function and thus nondiagnostic.  INR subtherapeutic on admission Treated initially with heparin, discussed w/ PMT and Oncology, will transition to indefinite enoxaparin. Complete short course of empiric antibiotics   Metastatic endometrial cancer Imaging suggested metastatic disease to left and right lung as well as possible to right leg.  Follow-up with oncology as an outpatient.   Right leg mass Concerning for metastatic disease.  Follow-up with oncology as an outpatient.   CKD stage IV Bilateral ureteral stents and percutaneous left nephrostomy  tube Stents scheduled to be internalized on 02/21/2023 at [facility]. AKI ruled out.  Creatinine stable   Constipation Large volume stool on imaging Bowel regimen on discharge   Aortic Atherosclerosis    Anemia of chronic disease Hgb stable, at baseline   Abnormal urinalysis, culture unrevealing. Treat empirically for complicated UTI   Metabolic acidosis with normal anion gap and failure of bicarbonate regeneration Sodium bicarbonate PO tabs ordered       Consultants:  Oncology  Nephrology PMT  Procedures performed:  None   Disposition: Home Diet recommendation:  Renal diet DISCHARGE MEDICATION: Allergies as of 02/10/2023       Reactions   Paclitaxel Anaphylaxis, Swelling, Other (See Comments)   Made everything swell   Taxotere [docetaxel] Anaphylaxis, Swelling, Other (See Comments)   Throat swelling        Medication List     STOP taking these medications    NovoLOG FlexPen 100 UNIT/ML FlexPen Generic drug: insulin aspart   warfarin 5 MG tablet Commonly known as: COUMADIN       TAKE these medications    acetaminophen 500 MG tablet Commonly known as: TYLENOL Take 500 mg by mouth every 6 (six) hours as needed for mild pain.   amoxicillin-clavulanate 875-125 MG tablet Commonly known as: AUGMENTIN Take 1 tablet by mouth 2 (two) times daily.   carvedilol 12.5 MG tablet Commonly known as: Coreg Take 1 tablet (12.5 mg total) by mouth 2 (two) times daily with a meal.   dexamethasone 4 MG tablet Commonly known as: DECADRON Take 1 tablet (4 mg total) by mouth daily. Start taking on: February 11, 2023   enoxaparin 60 MG/0.6ML injection Commonly known as: LOVENOX Inject 0.6 mLs (60 mg total) into the skin daily.   famotidine 20 MG tablet Commonly known as:  PEPCID Take 20 mg by mouth 2 (two) times daily as needed for heartburn or indigestion.   FreeStyle Libre 3 Sensor Misc 1 Act by Does not apply route daily. Place 1 sensor on the skin every 14 days. Use  to check glucose continuously   furosemide 20 MG tablet Commonly known as: LASIX TAKE 1 TABLET BY MOUTH EVERY DAY What changed:  when to take this reasons to take this   gabapentin 300 MG capsule Commonly known as: NEURONTIN Take 1 capsule (300 mg total) by mouth 3 (three) times daily.   insulin detemir 100 UNIT/ML injection Commonly known as: LEVEMIR Inject 0.2 mLs (20 Units total) into the skin 2 (two) times daily. What changed:  how much to take when to take this additional instructions   oxybutynin 10 MG 24 hr tablet Commonly known as: DITROPAN-XL Take 10 mg by mouth daily.   oxycodone 5 MG capsule Commonly known as: OXY-IR Take 1 capsule (5 mg total) by mouth every 4 (four) hours as needed for pain.   polyethylene glycol 17 g packet Commonly known as: MIRALAX / GLYCOLAX Take 17 g by mouth daily as needed for mild constipation. What changed: Another medication with the same name was added. Make sure you understand how and when to take each.   polyethylene glycol 17 g packet Commonly known as: MIRALAX / GLYCOLAX Take 17 g by mouth 2 (two) times daily. What changed: You were already taking a medication with the same name, and this prescription was added. Make sure you understand how and when to take each.   prochlorperazine 10 MG tablet Commonly known as: COMPAZINE Take 10 mg by mouth every 6 (six) hours as needed for nausea or vomiting.   sodium bicarbonate 650 MG tablet Take 1 tablet (650 mg total) by mouth 3 (three) times daily.   venlafaxine XR 37.5 MG 24 hr capsule Commonly known as: Effexor XR Take 1 capsule (37.5 mg total) by mouth daily with breakfast.        Follow-up Information     Karie Georges, MD. Schedule an appointment as soon as possible for a visit in 1 week(s).   Specialty: Family Medicine Contact information: [address] [place] [address] [phone]                Feels better, pain is  controlled  Discharge Exam: Filed Weights   02/08/23 0900  Weight: 61.2 kg   Physical Exam Vitals reviewed.  Constitutional:      General: She is not in acute distress.    Appearance: She is not ill-appearing or toxic-appearing.  Cardiovascular:     Rate and Rhythm: Normal rate and regular rhythm.     Heart sounds: No murmur heard. Pulmonary:     Effort: Pulmonary effort is normal. No respiratory distress.     Breath sounds: No wheezing, rhonchi or rales.  Neurological:     Mental Status: She is alert.  Psychiatric:        Mood and Affect: Mood normal.        Behavior: Behavior normal.      Condition at discharge: good  The results of significant diagnostics from this hospitalization (including imaging, microbiology, ancillary and laboratory) are listed below for reference.   Imaging Studies: ECHOCARDIOGRAM LIMITED  Result Date: 02/09/2023    ECHOCARDIOGRAM LIMITED REPORT   Patient Name:   Jaime Gomez Date of Exam: 02/09/2023 Medical Rec #:  [id number]    Height:  64.0 in Accession #:    [id number]   Weight:       135.0 lb Date of Birth:  08-13-1965    BSA:          1.655 m Patient Age:    56 years     BP:           154/73 mmHg Patient Gender: F            HR:           69 bpm. Exam Location:  Inpatient Procedure: Limited Echo, Limited Color Doppler and Cardiac Doppler Indications:    Cardiomyopathy - unspecified  History:        Patient has prior history of Echocardiogram examinations, most                 recent 12/04/2022. CKD, cancer; Risk Factors:Hypertension, hx PE                 and Diabetes.  Sonographer:    Wallie Char Referring Phys: [id number] PETER R ENNEVER IMPRESSIONS  1. Left ventricular ejection fraction, by estimation, is 55 to 60%. The left ventricle has normal function. The left ventricle has no regional wall motion abnormalities. Left ventricular diastolic parameters are indeterminate.  2. Right ventricular systolic function is normal. The right ventricular size  is normal.  3. The mitral valve is normal in structure. Trivial mitral valve regurgitation. No evidence of mitral stenosis.  4. The aortic valve is normal in structure. Aortic valve regurgitation is not visualized. No aortic stenosis is present.  5. The inferior vena cava is normal in size with greater than 50% respiratory variability, suggesting right atrial pressure of 3 mmHg. Conclusion(s)/Recommendation(s): LVEF has normalized since previous study. FINDINGS  Left Ventricle: Left ventricular ejection fraction, by estimation, is 55 to 60%. The left ventricle has normal function. The left ventricle has no regional wall motion abnormalities. The left ventricular internal cavity size was normal in size. There is  no left ventricular hypertrophy. Left ventricular diastolic parameters are indeterminate. Right Ventricle: The right ventricular size is normal. No increase in right ventricular wall thickness. Right ventricular systolic function is normal. Left Atrium: Left atrial size was normal in size. Right Atrium: Right atrial size was normal in size. Pericardium: There is no evidence of pericardial effusion. Mitral Valve: The mitral valve is normal in structure. Trivial mitral valve regurgitation. No evidence of mitral valve stenosis. MV peak gradient, 4.4 mmHg. The mean mitral valve gradient is 2.0 mmHg. Tricuspid Valve: The tricuspid valve is normal in structure. Tricuspid valve regurgitation is not demonstrated. No evidence of tricuspid stenosis. Aortic Valve: The aortic valve is normal in structure. Aortic valve regurgitation is not visualized. No aortic stenosis is present. Aortic valve mean gradient measures 4.0 mmHg. Aortic valve peak gradient measures 7.6 mmHg. Aortic valve area, by VTI measures 2.86 cm. Pulmonic Valve: The pulmonic valve was normal in structure. Pulmonic valve regurgitation is not visualized. No evidence of pulmonic stenosis. Aorta: The aortic root is normal in size and structure. Venous:  The inferior vena cava is normal in size with greater than 50% respiratory variability, suggesting right atrial pressure of 3 mmHg. IAS/Shunts: No atrial level shunt detected by color flow Doppler. Additional Comments: Spectral Doppler performed. Color Doppler performed.  LEFT VENTRICLE PLAX 2D LVIDd:         4.90 cm LVIDs:         3.40 cm LV PW:  0.90 cm LV IVS:        0.80 cm LVOT diam:     2.10 cm LV SV:         85 LV SV Index:   51 LVOT Area:     3.46 cm  LV Volumes (MOD) LV vol d, MOD A2C: 83.2 ml LV vol d, MOD A4C: 110.0 ml LV vol s, MOD A2C: 37.6 ml LV vol s, MOD A4C: 45.5 ml LV SV MOD A2C:     45.6 ml LV SV MOD A4C:     110.0 ml LV SV MOD BP:      53.6 ml RIGHT VENTRICLE          IVC RV Basal diam:  2.60 cm  IVC diam: 1.90 cm TAPSE (M-mode): 2.0 cm LEFT ATRIUM             Index        RIGHT ATRIUM           Index LA diam:        3.10 cm 1.87 cm/m   RA Area:     10.70 cm LA Vol (A2C):   34.0 ml 20.54 ml/m  RA Volume:   19.40 ml  11.72 ml/m LA Vol (A4C):   40.1 ml 24.22 ml/m LA Biplane Vol: 38.2 ml 23.08 ml/m  AORTIC VALVE AV Area (Vmax):    2.91 cm AV Area (Vmean):   2.73 cm AV Area (VTI):     2.86 cm AV Vmax:           138.00 cm/s AV Vmean:          101.000 cm/s AV VTI:            0.295 m AV Peak Grad:      7.6 mmHg AV Mean Grad:      4.0 mmHg LVOT Vmax:         116.00 cm/s LVOT Vmean:        79.600 cm/s LVOT VTI:          0.244 m LVOT/AV VTI ratio: 0.83  AORTA Ao Root diam: 3.40 cm MITRAL VALVE                TRICUSPID VALVE MV Area (PHT): 3.63 cm     TV Peak grad:   37.2 mmHg MV Area VTI:   3.40 cm     TV Vmax:        3.05 m/s MV Peak grad:  4.4 mmHg MV Mean grad:  2.0 mmHg     SHUNTS MV Vmax:       1.05 m/s     Systemic VTI:  0.24 m MV Vmean:      58.9 cm/s    Systemic Diam: 2.10 cm MV Decel Time: 209 msec MV E velocity: 90.60 cm/s MV A velocity: 104.00 cm/s MV E/A ratio:  0.87 Arvilla Meres MD Electronically signed by Arvilla Meres MD Signature Date/Time: 02/09/2023/3:41:58 PM     Final    NM PET Image Restag (PS) Skull Base To Thigh  Result Date: 02/09/2023 CLINICAL DATA:  Subsequent treatment strategy for endometrial/cervical cancer. EXAM: NUCLEAR MEDICINE PET SKULL BASE TO THIGH TECHNIQUE: 6.84 mCi F-18 FDG was injected intravenously. Full-ring PET imaging was performed from the skull base to thigh after the radiotracer. CT data was obtained and used for attenuation correction and anatomic localization. Fasting blood glucose: 123 mg/dl COMPARISON:  PET-CT 16/04/9603. Subsequent CTs of the chest, abdomen and pelvis 02/08/2023. FINDINGS: Mediastinal blood  pool activity: SUV max 2.8 NECK: No hypermetabolic cervical lymph nodes are identified. No suspicious activity identified within the pharyngeal mucosal space. Incidental CT findings: Bilateral carotid atherosclerosis. CHEST: There are no hypermetabolic mediastinal, hilar or axillary lymph nodes. Enlarging left perihilar lung mass is intensely hypermetabolic with an SUV max of 11.3 (previously 10.4). This measures 5.5 x 4.0 cm on image 67/4 compared with 3.8 x 2.9 cm on previous PET-CT. The central component of a right lower lobe lesion also appears enlarged, measuring approximately 3.6 x 2.8 cm on image 65/4. This is less well-defined, possibly due to surrounding treatment changes. This mass remains hypermetabolic with an SUV max of 8.4 (previously 11.6). The right upper lobe component appears improved and with less metabolic activity (SUV max 8.4, previously 11.2). Incidental CT findings: Right IJ Port-A-Cath extends to the superior cavoatrial junction. Aortic and coronary artery atherosclerosis noted. No significant pericardial effusion. ABDOMEN/PELVIS: There is no hypermetabolic activity within the liver, adrenal glands, spleen or pancreas. There is no hypermetabolic nodal activity in the abdomen or pelvis. Incidental CT findings: Percutaneous left nephroureteral tube and right double-J ureteral stent are unchanged in position. No  hydronephrosis. Gallstone, IVC filter, postsurgical changes in the anterior abdominal wall and aortoiliac atherosclerosis are noted. SKELETON: There are several new hypermetabolic osseous lesions, suspicious for metastatic disease. There is a lesion involving the medial aspect of the left scapula which has an SUV max of 6.6. Lesion involving the posterior elements at T12 on the left has an SUV max of 9.5. There is a hypermetabolic lesion laterally within the left 9th rib. Activity at the left glenohumeral joint is similar to the previous study and likely arthropathic. Incidental CT findings: none IMPRESSION: 1. Mixed response to interval therapy. The left perihilar lung mass is larger and more hypermetabolic than on the previous PET-CT. The dominant right lung lesions demonstrate interval decreased hypermetabolic activity. The central component of the right lower lobe lesion is less well-defined, possibly due to surrounding treatment changes. 2. 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 (Of note, a subsequently demonstrated mass in the right lower leg is not included on this examination. Given the presence of other osseous metastases, this may represent a metastasis. Please refer to separate MRI report of 02/09/2023.) 3. No evidence of metastatic disease within the abdomen or pelvis. 4. Stable position of the left nephroureteral tube and right double-J ureteral stent. No hydronephrosis. 5.  Aortic Atherosclerosis (ICD10-I70.0). Electronically Signed   By: Carey Bullocks M.D.   On: 02/09/2023 15:27   MR TIBIA FIBULA RIGHT WO CONTRAST  Result Date: 02/09/2023 CLINICAL DATA:  Soft tissue mass in the right lower leg on Doppler ultrasound. DVT demonstrated at that time. History of endometrial/cervical cancer. EXAM: MRI OF LOWER RIGHT EXTREMITY WITHOUT CONTRAST TECHNIQUE: Multiplanar, multisequence MR imaging of the right lower leg was performed.  No intravenous contrast was administered. COMPARISON:  Doppler ultrasound 02/08/2023.  PET-CT 02/07/2023. FINDINGS: Bones/Joint/Cartilage Both lower legs are included on the coronal images. Markers were placed around the patient's palpable concern anteromedially in the proximal 3rd of the right lower leg. Underlying complex soft tissue mass further described below, eroding the posteromedial aspect of the adjacent tibia. There is heterogeneous intramedullary T2 hyperintensity within the adjacent proximal shaft of the right tibia. There is periosteal edema without evidence of periosteal thickening. No other osseous lesions are identified. There is no evidence of acute fracture or dislocation. No significant right knee or ankle  joint effusion or arthropathy. Ligaments No significant ligamentous abnormalities are identified at the right knee or ankle. Muscles and Tendons Nonspecific edema throughout the deep posterior compartment of the right lower leg. No focal muscular atrophy identified. No evidence of tendon tear or tenosynovitis. Soft tissues As above, complex subperiosteal mass along the posteromedial aspect of the proximal right tibial metaphysis, corresponding with the mass seen on recent ultrasound. This demonstrates heterogeneous T2 hyperintensity. On the T1 weighted images, the signal is fairly homogeneous with dependent intrinsic T1 shortening. Contrast was not administered for this examination, although internal vascularity was noted on ultrasound, implying a solid lesion. As described above, there is resulting erosion of the adjacent tibial cortex with adjacent marrow edema. No other soft tissue masses or fluid collections are identified. There is surrounding soft tissue edema which extends into the anterior pretibial subcutaneous tissues. The lower legs are not included on the recent PET-CT. Known DVT is not well demonstrated by this examination. There is a small Baker's cyst. IMPRESSION: 1. Complex  subperiosteal mass along the posteromedial aspect of the proximal right tibial metaphysis, corresponding with the mass seen on recent ultrasound. Contrast was not administered for this examination, although internal vascularity was noted on ultrasound, implying a solid lesion. There is resulting erosion of the adjacent tibial cortex with adjacent marrow edema and periosteal edema. Findings are suspicious for metastatic disease in this patient with a history of endometrial carcinoma. Differential includes infection with a subperiosteal abscess and adjacent tibial osteomyelitis. Consider tissue sampling. 2. No other soft tissue masses or fluid collections identified. 3. Nonspecific edema within the deep posterior compartment of the right lower leg. 4. Known DVT is not well demonstrated by this examination. Electronically Signed   By: Carey Bullocks M.D.   On: 02/09/2023 14:57   US Venous Img Lower Bilateral (DVT)  Result Date: 02/08/2023 CLINICAL DATA:  DVT (deep venous thrombosis) (HCC) . EXAM: BILATERAL LOWER EXTREMITY VENOUS DOPPLER ULTRASOUND TECHNIQUE: Gray-scale sonography with graded compression, as well as color Doppler and duplex ultrasound were performed to evaluate the lower extremity deep venous systems from the level of the common femoral vein and including the common femoral, femoral, profunda femoral, popliteal and calf veins including the posterior tibial, peroneal and gastrocnemius veins when visible. The superficial great saphenous vein was also interrogated. Spectral Doppler was utilized to evaluate flow at rest and with distal augmentation maneuvers in the common femoral, femoral and popliteal veins. COMPARISON:  None Available. FINDINGS: RIGHT LOWER EXTREMITY Common Femoral Vein: Positive for thrombus. Saphenofemoral Junction: No evidence of thrombus. Normal compressibility and flow on color Doppler imaging. Profunda Femoral Vein: Positive for thrombus. Femoral Vein: Positive for thrombus.  Popliteal Vein: Positive for thrombus. Calf Veins: Limited visualization. Other: Heterogeneous 2.7 x 4.1 x 2.3 cm mass in the upper right calf with internal vascularity LEFT LOWER EXTREMITY Common Femoral Vein: No evidence of thrombus. Normal compressibility, respiratory phasicity and response to augmentation. Saphenofemoral Junction: No evidence of thrombus. Normal compressibility and flow on color Doppler imaging. Profunda Femoral Vein: No evidence of thrombus. Normal compressibility and flow on color Doppler imaging. Femoral Vein: Positive for thrombus. Popliteal Vein: Positive for thrombus. Calf Veins: Not well visualized. IMPRESSION: 1. Positive for DVT involving the right common femoral, profunda femoral, femoral and popliteal veins and the left femoral and popliteal veins. 2. Heterogeneous 2.7 x 4.1 x 2.3 cm mass in the upper right calf with internal vascularity. Recommend MRI to further evaluate. Electronically Signed   By: Juluis Mire.D.  On: 02/08/2023 14:21   CT Chest Wo Contrast  Result Date: 02/08/2023 CLINICAL DATA:  Partially visualized masslike consolidation; * Tracking Code: BO * EXAM: CT CHEST WITHOUT CONTRAST TECHNIQUE: Multidetector CT imaging of the chest was performed following the standard protocol without IV contrast. RADIATION DOSE REDUCTION: This exam was performed according to the departmental dose-optimization program which includes automated exposure control, adjustment of the mA and/or kV according to patient size and/or use of iterative reconstruction technique. COMPARISON:  Same day CT of the abdomen and pelvis; PET-CT dated August 21, 2022; PET-CT dated February 07, 2023; CT renal stone dated February 01, 2023 FINDINGS: Cardiovascular: Normal heart size. No pericardial effusion. Normal caliber thoracic aorta with mild atherosclerotic disease. Severe coronary artery calcifications. Mediastinum/Nodes: Esophagus and thyroid are unremarkable. No enlarging lymph nodes seen in the  chest. Reference right pre-vascular lymph node measuring 9 mm in short axis on series 2, image 59, unchanged. Lungs/Pleura: Bilateral pulmonary masses again seen. Left perihilar mass measures 4.7 x 4.6 cm on series 2, image 68, previously 3.8 x 2.9 cm on August 21, 2022 PET-CT. Associated increased occlusion of the left lower lobe anteromedial and lateral bronchopulmonary segments. Dominant right upper lobe mass is decreased in size, however there are new areas of peripheral nodularity. Measures 2.5 x 2.9 cm on series 2, image 39, previously 3.1 x 3.4 cm. New peripheral nodular component measuring 7 mm on series 3, image 33. Dominant right lower lobe pulmonary nodule is similar in size when compared with prior August 21, 2022 PET-CT, however there are new peripheral nodular components. Cystic lesion of the right lower lobe measuring up to 3.2 cm previously, 2.5 cm, with new peripheral nodular components, largest measures 9 mm on series 3, image 90. Nodular component are increased in size when lung bases seen on CT renal stone CT dated January 02, 2023. Additional new bilateral solid pulmonary nodules are seen. Reference solid nodule of the left lower lobe measuring 7 mm on series 3, image 76. Upper Abdomen: No acute abnormality. See same day separately dictated CT of the abdomen and pelvis for further discussion. Musculoskeletal: No chest wall mass or suspicious bone lesions identified. IMPRESSION: 1. Left perihilar masses is increased in size when compared with August 21, 2022 PET-CT, findings are concerning for progressive disease. See recently performed PET-CT dated February 07, 2023 for further discussion. 2. Cystic lesion of the right lower lobe with nodular components, nodules are increased size when compared with portions of the lung visualized on January 02, 2023 abdomen and pelvis CT. 3. Additional new bilateral solid pulmonary nodules are seen when compared with PET-CT dated August 21, 2022. 4. No enlarging  lymph nodes seen in the chest. 5. Aortic Atherosclerosis (ICD10-I70.0). Electronically Signed   By: Allegra Lai M.D.   On: 02/08/2023 11:34   CT Renal Stone Study  Result Date: 02/08/2023 CLINICAL DATA:  History of endometrial cancer with 2 day history of left flank pain associated with nausea and vomiting. * Tracking Code: BO * EXAM: CT ABDOMEN AND PELVIS WITHOUT CONTRAST TECHNIQUE: Multidetector CT imaging of the abdomen and pelvis was performed following the standard protocol without IV contrast. RADIATION DOSE REDUCTION: This exam was performed according to the departmental dose-optimization program which includes automated exposure control, adjustment of the mA and/or kV according to patient size and/or use of iterative reconstruction technique. COMPARISON:  Nuclear medicine PET dated 02/07/2023, CT abdomen and pelvis dated 01/02/2023 FINDINGS: Lower chest: Partially imaged masslike consolidation involving the left hilum and right  lower lobe are new from 01/02/2023. Medial right lower lobe nodule measuring 1.0 x 0.8 cm (4:10), previously 0.6 x 0.5 cm. Slightly inferiorly, a 6 mm subsolid nodule is new (4:15). Previously noted irregular/clustered nodule in the medial left lower lobe is no longer seen. No pleural effusion or pneumothorax demonstrated. Partially imaged heart size is normal. Coronary artery calcifications. Hepatobiliary: No focal hepatic lesions. No intra or extrahepatic biliary ductal dilation. Cholelithiasis. Pancreas: No focal lesions or main ductal dilation. Spleen: Normal in size without focal abnormality. Adrenals/Urinary Tract: No adrenal nodules. Bilateral ureteral stents and percutaneous left nephrostomy tube in-situ, similar to IR procedure dated 01/09/2023. Similar right urothelial thickening and mild pelvic fullness. No suspicious renal mass, calculi or hydronephrosis. Urinary bladder is decompressed. Stomach/Bowel: Normal appearance of the stomach. No evidence of bowel wall  thickening, distention, or inflammatory changes. Similar rectal distention containing large volume stool. Small to moderate volume stool within the remainder of the colon. Fecalization of material within the terminal ileum. Normal appendix. Vascular/Lymphatic: Aortic atherosclerosis. Infrarenal IVC filter in-situ. No enlarged abdominal or pelvic lymph nodes. Reproductive: No adnexal masses. Other: Small volume presacral edema. No free air or fluid collection. Musculoskeletal: No acute or abnormal lytic or blastic osseous lesions. Postsurgical changes of the anterior abdominal wall. IMPRESSION: 1. Partially imaged masslike consolidation involving the left hilum and right lower lobe are new from 01/02/2023 and may represent infection/inflammation or metastatic disease. 2. Medial right lower lobe nodule measuring 1.0 x 0.8 cm, previously 0.6 x 0.5 cm. Slightly inferiorly, a 6 mm subsolid nodule is new. Previously noted irregular/clustered nodule in the medial left lower lobe is no longer seen. 3. Bilateral ureteral stents and percutaneous left nephrostomy tube in-situ, similar to IR procedure dated 01/09/2023. Similar right urothelial thickening and mild pelvic fullness. 4. Similar rectal distention containing large volume stool. Small to moderate volume stool within the remainder of the colon. Fecalization of material within the terminal ileum likely reflecting slowed transit. 5. Aortic Atherosclerosis (ICD10-I70.0). Coronary artery calcifications. Assessment for potential risk factor modification, dietary therapy or pharmacologic therapy may be warranted, if clinically indicated. Electronically Signed   By: Agustin Cree M.D.   On: 02/08/2023 09:34    Microbiology: Results for orders placed or performed during the hospital encounter of 02/08/23  Urine Culture     Status: Abnormal   Collection Time: 02/08/23  8:55 AM   Specimen: Urine, Clean Catch  Result Value Ref Range Status   Specimen Description   Final     URINE, CLEAN CATCH Performed at [facility], [address] [place], [place] [address]    Special Requests   Final    NONE Performed at [facility], [address] [place], [place] [address]    Culture MULTIPLE SPECIES PRESENT, SUGGEST RECOLLECTION (A)  Final   Report Status 02/09/2023 FINAL  Final  Culture, blood (Routine X 2) w Reflex to ID Panel     Status: None (Preliminary result)   Collection Time: 02/08/23  7:15 PM   Specimen: BLOOD RIGHT ARM  Result Value Ref Range Status   Specimen Description   Final    BLOOD RIGHT ARM Performed at [facility], [address], [place], [place] [address]    Special Requests   Final    BOTTLES DRAWN AEROBIC AND ANAEROBIC Blood Culture adequate volume Performed at [facility], [address], [place], [place] [address]    Culture   Final  NO GROWTH 2 DAYS Performed at [facility], [address], [place], [place] [address]    Report Status PENDING  Incomplete  Culture, blood (Routine X 2) w Reflex to ID Panel     Status: None (Preliminary result)   Collection Time: 02/08/23  7:15 PM   Specimen: BLOOD RIGHT HAND  Result Value Ref Range Status   Specimen Description   Final    BLOOD RIGHT HAND Performed at [facility], [address], [place], [place] [address]    Special Requests   Final    BOTTLES DRAWN AEROBIC AND ANAEROBIC Blood Culture adequate volume Performed at [facility], [address] [place], [place] [address]    Culture   Final    NO GROWTH 2 DAYS Performed at [facility], [address], [place], [place] [address]    Report Status PENDING  Incomplete    Labs: CBC: Recent Labs  Lab 02/08/23 0922 02/09/23 0500 02/10/23 0401  WBC 6.0 6.1 5.2  NEUTROABS 4.3  --  4.5  HGB 8.6* 7.8* 7.6*  HCT 25.3* 24.2* 23.7*  MCV 92.0 96.4 95.2  PLT 181 181 170   Basic Metabolic Panel: Recent Labs  Lab  02/08/23 0922 02/09/23 0500 02/10/23 0401  NA 134* 137 140  K 4.8 4.2 5.0  CL 107 116* 113*  CO2 16* 15* 17*  GLUCOSE 229* 56* 176*  BUN 104* 89* 83*  CREATININE 3.59* 3.07* 2.83*  CALCIUM 9.0 8.4* 9.0  PHOS  --  5.3* 4.4   Liver Function Tests: Recent Labs  Lab 02/08/23 0922 02/09/23 0500 02/10/23 0401  AST 12*  --   --   ALT 10  --   --   ALKPHOS 95  --   --   BILITOT 0.2*  --   --   PROT 7.6  --   --   ALBUMIN 3.1* 2.8* 2.8*   CBG: Recent Labs  Lab 02/09/23 1241 02/09/23 1719 02/09/23 2034 02/10/23 0728 02/10/23 1152  GLUCAP 101* 198* 284* 150* 109*    Discharge time spent: greater than 30 minutes.  Signed: Brendia Sacks, MD [facility] 02/10/2023

## 2023-02-10 NOTE — Progress Notes (Signed)
Daily Progress Note   Patient Name: Jaime Gomez       Date: 02/10/2023 DOB: [birth year]  Age: 57 y.o. MRN#: [id number] Attending Physician: Jaime Brooking, MD Primary Care Physician: Jaime Georges, MD Admit Date: 02/08/2023 Length of Stay: 2 days  Reason for Consultation/Follow-up: Establishing goals of care and symptom management  Subjective:   CC: Patient noting pain improved today. Following up regarding complex medical decision making and symptom management.   Subjective:  Reviewed EMR prior to presenting to bedside.  At time of EMR review patient had received oxycodone 5 mg x 2 doses in past 24 hours.  Creatinine has improved to 2.83 at this time. Jaime Gomez, oncologist, saw patient this morning and is going to discuss outpatient systemic cancer therapy.  Presented to bedside to meet with patient.  Patient's sister at bedside as well.  Able to follow-up on patient's symptom management at this time.  Patient feels her pain has improved.  Discussed likelihood of Tylenol and steroids assisting with inflammation management.  Patient noted she was unable to try inhaled morphine solution as difficulties with administration.  Noted can continue upon discharge as patient is hoping to go home today.  Also discussed importance of follow-up with palliative medicine team at [facility]; noted will place referral. Patient agreeing with this plan.   Also abided patient with gold signed DNR form to take home.  Noted would have scanned into EMR as well.  Patient noted chaplain visited yesterday though did not assist with completion of advance directives.  Noted would reach out to chaplain today to hopefully assist with completion though if unable to in the inpatient setting could follow-up at the palliative medicine clinic at [facility] to get assistance with this.  All questions answered at that time.  Thanked patient for allowing me to visit with her and her sister today.  Followed up with IDT after  visit with patient.  Patient likely to discharge today after receiving blood transfusion.  Review of Systems Pain improved Objective:   Vital Signs:  BP 131/75 (BP Location: Right Arm)   Pulse (!) 58   Temp 98.1 F (36.7 C) (Oral)   Resp 16   Ht 5\' 4"  (1.626 m)   Wt 61.2 kg   SpO2 100%   BMI 23.17 kg/m   Physical Exam: General: NAD, alert, laying in bed, pleasant  Eyes: no drainage noted HENT: moist mucous membranes Cardiovascular: RRR Respiratory: no increased work of breathing noted, not in respiratory distress Abdomen: distended Skin: no rashes or lesions on visible skin Neuro: A&Ox4, following commands easily Psych: appropriately answers all questions  Imaging:  I personally reviewed recent imaging.   Assessment & Plan:   Assessment: Patient is a 57yo F with a PMHx of metastatic endometrial cancer, CKD stage V, DM type II, hx of kidney stones, HTN, and DVT who was admitted on 02/08/23 for management of left flank pain and left chest pain pleuritic in nature. Since admission, patient has been found to have a new PE, is receiving imaging for leg mass, and has AKI on CKD stage V. Patient is followed by Jaime Gomez, oncologist, in the outpatient setting and while being admitted. Nephrology consulted as well. Palliative medicine team consulted to assist with complex medical decision making and symptom management.   Recommendations/Plan: # Complex medical decision making/goals of care                -Patient planning to follow up with Jaime Gomez in outpatient setting  to discuss systemic cancer directed therapies.                 -Patient still wanting to complete AD naming her sister, Jaime Gomez, as her HCPOA and her other sister, Jaime Gomez, as her primary alternate. Patient would want to attempt organ donation. Patient doesn't not want feedings tube or to be on indefinite IVFs. Asked chaplain again to assist with today. If not inpatient, follow up with PMT at [facility] to  assist.                 -Also discussed with patient and sister hospice for when patient wants to transition to focusing on symptom management only at the end of life and is no longer seeking cancer directed therapies. Patient notes she would want to be allowed to spend her time at home at the end of life.                 -  Code Status: DNR     -provided completed gold DNR form to patient today.  # Symptom management                - Pain/SOB/coughing, in setting of metastatic endometrial cancer with blood clots                               -Continue dexamethasone 4mg  daily scheduled for at least 5 days, may need longer                               -Continue on gabapentin 100mg  TID. Watch dosing with renal dysfunction.                               -Continue oxycodone 5mg  q4hrs prn                               -Continue morphine 10mg /ml inhalation q2hrs prn SOB or coughing                               -Discussed with IR Dr. Elby Gomez on 7/19 and patient not currently nerve block candidate due to position of pain and need for anticoagulation.                   -Constipation                               -Continue Miralax to scheduled daily                               -Discontinued colace. Please do not restart.  # Psychosocial Support:  -sisters  # Discharge Planning: Home with Palliative Services  -Placed referral to [facility] for outpatient palliative medicine follow up.   Discussed with: patient, patient's sister, hospitalist, RN, chaplain   Thank you for allowing the palliative care team to participate in the care Jaime Gomez.  Jaime Morin, DO Palliative Care Provider PMT # [phone]  If patient remains symptomatic despite maximum doses, please call PMT at [phone] between 0700 and 1900. Outside of these hours, please call attending,  as PMT does not have night coverage.  *Please note that this is a verbal dictation therefore any spelling or grammatical errors are due to the  "Dragon [employer] One" system interpretation.

## 2023-02-10 NOTE — Progress Notes (Signed)
Chaplain was messaged, in addition to [facility] consult being placed, for AD education and support. This visit was in response to those requests. Patient was alert and willing to visit, her sister, [name] was at her bedside. Patient wants to complete HCPOA, naming her 2 sisters, [name] and Raynelle Fanning, as Health Care Agents. Patient will review document and complete, then will let nurse know when she's ready to have it notarized in the presence of 2 witnesses. I informed her nurse of this. If we are not able to complete notarization process today, pt will bring back on follow up visit at [facility] to complete.The 2 sisters she is naming as Health Care Agents are who we would reach out to as her decision makers, even if we aren't able to complete paperwork today. Chaplain services may be reached for follow up.  [address] [place]   02/10/23 1330  Spiritual Encounters  Type of Visit Follow up  Care provided to: Patient;Family  Referral source Physician  Reason for visit Advance directives  OnCall Visit Yes  Spiritual Framework  Presenting Themes Goals in life/care  Community/Connection Family  Patient Stress Factors Health changes  Interventions  Spiritual Care Interventions Made Established relationship of care and support;Compassionate presence;Reflective listening;Decision-making support/facilitation  Intervention Outcomes  Outcomes Awareness of support

## 2023-02-10 NOTE — Plan of Care (Signed)
  Problem: Education: Goal: Knowledge of General Education information will improve Description: Including pain rating scale, medication(s)/side effects and non-pharmacologic comfort measures Outcome: Progressing   Problem: Health Behavior/Discharge Planning: Goal: Ability to manage health-related needs will improve Outcome: Progressing   Problem: Clinical Measurements: Goal: Ability to maintain clinical measurements within normal limits will improve Outcome: Progressing   Problem: Nutrition: Goal: Adequate nutrition will be maintained Outcome: Progressing   Problem: Coping: Goal: Level of anxiety will decrease Outcome: Progressing   Problem: Pain Managment: Goal: General experience of comfort will improve Outcome: Progressing   

## 2023-02-11 LAB — CA 125: Cancer Antigen (CA) 125: 363 U/mL — ABNORMAL HIGH (ref 0.0–38.1)

## 2023-02-12 ENCOUNTER — Telehealth: Payer: Self-pay | Admitting: *Deleted

## 2023-02-12 LAB — TYPE AND SCREEN
ABO/RH(D): A POS
Antibody Screen: POSITIVE
DAT, IgG: NEGATIVE
Unit division: 0

## 2023-02-12 LAB — CULTURE, BLOOD (ROUTINE X 2): Culture: NO GROWTH

## 2023-02-12 LAB — BPAM RBC: ISSUE DATE / TIME: 202407201424

## 2023-02-12 NOTE — Transitions of Care (Post Inpatient/ED Visit) (Signed)
   02/12/2023  Name: Jaime Gomez MRN: [id number] DOB: [birth year]  Today's TOC FU Call Status: Today's TOC FU Call Status:: Unsuccessul Call (1st Attempt) Unsuccessful Call (1st Attempt) Date: 02/12/23  Attempted to reach the patient regarding the most recent Inpatient/ED visit.  Follow Up Plan: Additional outreach attempts will be made to reach the patient to complete the Transitions of Care (Post Inpatient/ED visit) call.   Gean Maidens BSN RN [facility] [phone]

## 2023-02-13 ENCOUNTER — Telehealth: Payer: Self-pay | Admitting: *Deleted

## 2023-02-13 NOTE — Transitions of Care (Post Inpatient/ED Visit) (Signed)
02/13/2023  Name: Jaime Gomez MRN: [id number] DOB: September 05, 1965  Today's TOC FU Call Status: Today's TOC FU Call Status:: Successful TOC FU Call Competed TOC FU Call Complete Date: 02/13/23  Transition Care Management Follow-up Telephone Call Date of Discharge: 02/10/23 Discharge Facility: [facility] Primary Inpatient Discharge Diagnosis:: abdominal pain How have you been since you were released from the hospital?: Better Any questions or concerns?: No  Items Reviewed: Did you receive and understand the discharge instructions provided?: Yes Medications obtained,verified, and reconciled?: Yes (Medications Reviewed) Any new allergies since your discharge?: No Dietary orders reviewed?: No Do you have support at home?: Yes People in Home: alone Name of Support/Comfort Primary Source: [name]  Medications Reviewed Today: Medications Reviewed Today     Reviewed by Luella Cook, RN (Case Manager) on 02/13/23 at 1434  Med List Status: <None>   Medication Order Taking? Sig Documenting Provider Last Dose Status Informant  acetaminophen (TYLENOL) 500 MG tablet [id number] Yes Take 500 mg by mouth every 6 (six) hours as needed for mild pain. [provider] Taking Active Self  amoxicillin-clavulanate (AUGMENTIN) 875-125 MG tablet [id number] Yes Take 1 tablet by mouth 2 (two) times daily. Standley Brooking, MD Taking Active   carvedilol (COREG) 12.5 MG tablet [id number] Yes Take 1 tablet (12.5 mg total) by mouth 2 (two) times daily with a meal. [name], MD Taking Active Self  Continuous Glucose Sensor (FREESTYLE LIBRE 3 SENSOR) [place] [id number]  1 Act by Does not apply route daily. Place 1 sensor on the skin every 14 days. Use to check glucose continuously  Patient not taking: Reported on 02/08/2023   Karie Georges, MD  Active Self  dexamethasone (DECADRON) 4 MG tablet [id number] Yes Take 1 tablet (4 mg total) by mouth daily. Standley Brooking, MD Taking Active    enoxaparin (LOVENOX) 60 MG/0.6ML injection [id number] Yes Inject 0.6 mLs (60 mg total) into the skin daily. Standley Brooking, MD Taking Active   famotidine (PEPCID) 20 MG tablet [id number] Yes Take 20 mg by mouth 2 (two) times daily as needed for heartburn or indigestion. [provider] Taking Active Self  furosemide (LASIX) 20 MG tablet [id number] Yes TAKE 1 TABLET BY MOUTH EVERY DAY  Patient taking differently: Take 20 mg by mouth daily as needed for fluid or edema.   Karie Georges, MD Taking Active Self  gabapentin (NEURONTIN) 300 MG capsule [id number] Yes Take 1 capsule (300 mg total) by mouth 3 (three) times daily. Erenest Blank, NP Taking Active Self  insulin detemir (LEVEMIR) 100 UNIT/ML injection [id number] Yes Inject 0.2 mLs (20 Units total) into the skin 2 (two) times daily.  Patient taking differently: Inject 10-20 Units into the skin See admin instructions. Inject 10-20 units into the skin two times a day before meals, per sliding scale   Ennever, Rose Phi, MD Taking Active Self  oxybutynin (DITROPAN-XL) 10 MG 24 hr tablet [id number] Yes Take 10 mg by mouth daily. [provider] Taking Active Self  oxycodone (OXY-IR) 5 MG capsule [id number] Yes Take 1 capsule (5 mg total) by mouth every 4 (four) hours as needed for pain. [name], MD Taking Active Self  polyethylene glycol (MIRALAX / GLYCOLAX) 17 g packet [id number] Yes Take 17 g by mouth daily as needed for mild constipation. Joycelyn Das, MD Taking Active Self           Med Note Ventura Bruns Feb 08, 2023  5:54 PM)  polyethylene glycol (MIRALAX / GLYCOLAX) 17 g packet [id number] Yes Take 17 g by mouth 2 (two) times daily. Standley Brooking, MD Taking Active   prochlorperazine (COMPAZINE) 10 MG tablet [id number] Yes Take 10 mg by mouth every 6 (six) hours as needed for nausea or vomiting. [provider] Taking Active Self  sodium bicarbonate 650 MG tablet [id number] Yes Take 1 tablet (650  mg total) by mouth 3 (three) times daily. Standley Brooking, MD Taking Active   venlafaxine XR (EFFEXOR XR) 37.5 MG 24 hr capsule [id number] Yes Take 1 capsule (37.5 mg total) by mouth daily with breakfast. Karie Georges, MD Taking Active Self            Home Care and Equipment/Supplies: Were Home Health Services Ordered?: NA Any new equipment or medical supplies ordered?: NA  Functional Questionnaire: Do you need assistance with bathing/showering or dressing?: No Do you need assistance with meal preparation?: No Do you need assistance with eating?: No Do you have difficulty maintaining continence: No Do you need assistance with getting out of bed/getting out of a chair/moving?: No Do you have difficulty managing or taking your medications?: No  Follow up appointments reviewed: PCP Follow-up appointment confirmed?: Yes Date of PCP follow-up appointment?: 03/01/23 Follow-up Provider: Nira Conn Specialist [facility] Follow-up appointment confirmed?: Yes Date of Specialist follow-up appointment?: 02/15/23 Follow-Up Specialty Provider:: Dr Myna Hidalgo Do you need transportation to your follow-up appointment?: No Do you understand care options if your condition(s) worsen?: Yes-patient verbalized understanding  SDOH Interventions Today    Flowsheet Row Most Recent Value  SDOH Interventions   Food Insecurity Interventions Intervention Not Indicated  Housing Interventions Intervention Not Indicated  Transportation Interventions Intervention Not Indicated, Patient Resources (Friends/Family)      Interventions Today    Flowsheet Row Most Recent Value  General Interventions   General Interventions Discussed/Reviewed General Interventions Discussed, General Interventions Reviewed, Doctor Visits  Doctor Visits Discussed/Reviewed Doctor Visits Discussed, Doctor Visits Reviewed  Pharmacy Interventions   Pharmacy Dicussed/Reviewed Pharmacy Topics Discussed      [facility]  Interventions Today    Flowsheet Row Most Recent Value  TOC Interventions   TOC Interventions Discussed/Reviewed TOC Interventions Discussed       Gean Maidens BSN RN [facility] [phone]

## 2023-02-14 ENCOUNTER — Other Ambulatory Visit: Payer: Self-pay

## 2023-02-14 DIAGNOSIS — C530 Malignant neoplasm of endocervix: Secondary | ICD-10-CM

## 2023-02-14 DIAGNOSIS — G629 Polyneuropathy, unspecified: Secondary | ICD-10-CM

## 2023-02-14 DIAGNOSIS — E119 Type 2 diabetes mellitus without complications: Secondary | ICD-10-CM

## 2023-02-14 MED ORDER — GABAPENTIN 300 MG PO CAPS
300.0000 mg | ORAL_CAPSULE | Freq: Three times a day (TID) | ORAL | 3 refills | Status: DC
Start: 2023-02-14 — End: 2023-06-04

## 2023-02-15 ENCOUNTER — Inpatient Hospital Stay: Payer: 59

## 2023-02-15 ENCOUNTER — Encounter: Payer: Self-pay | Admitting: Hematology & Oncology

## 2023-02-15 ENCOUNTER — Inpatient Hospital Stay (HOSPITAL_BASED_OUTPATIENT_CLINIC_OR_DEPARTMENT_OTHER): Payer: 59 | Admitting: Hematology & Oncology

## 2023-02-15 ENCOUNTER — Other Ambulatory Visit: Payer: Self-pay

## 2023-02-15 VITALS — BP 172/76 | HR 58 | Temp 97.9°F | Resp 18 | Ht 64.0 in | Wt 135.0 lb

## 2023-02-15 DIAGNOSIS — N170 Acute kidney failure with tubular necrosis: Secondary | ICD-10-CM | POA: Diagnosis not present

## 2023-02-15 DIAGNOSIS — C541 Malignant neoplasm of endometrium: Secondary | ICD-10-CM

## 2023-02-15 DIAGNOSIS — E875 Hyperkalemia: Secondary | ICD-10-CM | POA: Diagnosis not present

## 2023-02-15 DIAGNOSIS — N1831 Chronic kidney disease, stage 3a: Secondary | ICD-10-CM | POA: Diagnosis not present

## 2023-02-15 DIAGNOSIS — D631 Anemia in chronic kidney disease: Secondary | ICD-10-CM | POA: Diagnosis not present

## 2023-02-15 DIAGNOSIS — N2581 Secondary hyperparathyroidism of renal origin: Secondary | ICD-10-CM | POA: Diagnosis not present

## 2023-02-15 DIAGNOSIS — N184 Chronic kidney disease, stage 4 (severe): Secondary | ICD-10-CM | POA: Diagnosis not present

## 2023-02-15 DIAGNOSIS — R2241 Localized swelling, mass and lump, right lower limb: Secondary | ICD-10-CM

## 2023-02-15 DIAGNOSIS — Z7901 Long term (current) use of anticoagulants: Secondary | ICD-10-CM | POA: Diagnosis not present

## 2023-02-15 DIAGNOSIS — I129 Hypertensive chronic kidney disease with stage 1 through stage 4 chronic kidney disease, or unspecified chronic kidney disease: Secondary | ICD-10-CM | POA: Diagnosis not present

## 2023-02-15 DIAGNOSIS — E872 Acidosis, unspecified: Secondary | ICD-10-CM | POA: Diagnosis not present

## 2023-02-15 DIAGNOSIS — N189 Chronic kidney disease, unspecified: Secondary | ICD-10-CM | POA: Diagnosis not present

## 2023-02-15 LAB — CMP (CANCER CENTER ONLY)
ALT: 5 U/L (ref 0–44)
AST: 6 U/L — ABNORMAL LOW (ref 15–41)
Albumin: 3.9 g/dL (ref 3.5–5.0)
Alkaline Phosphatase: 77 U/L (ref 38–126)
Anion gap: 13 (ref 5–15)
BUN: 91 mg/dL — ABNORMAL HIGH (ref 6–20)
CO2: 20 mmol/L — ABNORMAL LOW (ref 22–32)
Calcium: 9.7 mg/dL (ref 8.9–10.3)
Chloride: 101 mmol/L (ref 98–111)
Creatinine: 2.86 mg/dL — ABNORMAL HIGH (ref 0.44–1.00)
GFR, Estimated: 19 mL/min — ABNORMAL LOW (ref >60)
Glucose, Bld: 384 mg/dL — ABNORMAL HIGH (ref 70–99)
Potassium: 5.5 mmol/L — ABNORMAL HIGH (ref 3.5–5.1)
Sodium: 134 mmol/L — ABNORMAL LOW (ref 135–145)
Total Bilirubin: 0.4 mg/dL (ref 0.3–1.2)
Total Protein: 7.6 g/dL (ref 6.5–8.1)

## 2023-02-15 LAB — CBC WITH DIFFERENTIAL (CANCER CENTER ONLY)
Abs Immature Granulocytes: 0.1 10*3/uL — ABNORMAL HIGH (ref 0.00–0.07)
Basophils Absolute: 0 10*3/uL (ref 0.0–0.1)
Basophils Relative: 0 %
Eosinophils Absolute: 0 10*3/uL (ref 0.0–0.5)
Eosinophils Relative: 0 %
HCT: 31.6 % — ABNORMAL LOW (ref 36.0–46.0)
Hemoglobin: 10.1 g/dL — ABNORMAL LOW (ref 12.0–15.0)
Immature Granulocytes: 1 %
Lymphocytes Relative: 5 %
Lymphs Abs: 0.5 10*3/uL — ABNORMAL LOW (ref 0.7–4.0)
MCH: 30.7 pg (ref 26.0–34.0)
MCHC: 32 g/dL (ref 30.0–36.0)
MCV: 96 fL (ref 80.0–100.0)
Monocytes Absolute: 0.3 10*3/uL (ref 0.1–1.0)
Monocytes Relative: 3 %
Neutro Abs: 9.1 10*3/uL — ABNORMAL HIGH (ref 1.7–7.7)
Neutrophils Relative %: 91 %
Platelet Count: 196 10*3/uL (ref 150–400)
RBC: 3.29 MIL/uL — ABNORMAL LOW (ref 3.87–5.11)
RDW: 14.9 % (ref 11.5–15.5)
WBC Count: 10 10*3/uL (ref 4.0–10.5)
nRBC: 0 % (ref 0.0–0.2)

## 2023-02-15 LAB — PROTIME-INR
INR: 1.2 (ref 0.8–1.2)
Prothrombin Time: 14.9 seconds (ref 11.4–15.2)

## 2023-02-15 LAB — PHOSPHORUS: Phosphorus: 4.6 mg/dL (ref 2.5–4.6)

## 2023-02-15 LAB — SAMPLE TO BLOOD BANK

## 2023-02-15 NOTE — Progress Notes (Signed)
Hematology and Oncology Follow Up Visit  Jaime Gomez [id number] [birth year] 57 y.o. 02/15/2023   Principle Diagnosis:  Metastatic adenocarcinoma of the endometrium -- No Actionable mutations Chronic renal failure Recurrent thromboembolic disease   Current Therapy:        Afinitor/letrozole - DC'd Blood transfusion as needed Lovenox 60 mg SQ daily   Interim History:  Jaime Gomez is her today with her sister for follow-up.  She actually was discharged from the hospital last week.  She had come in with worsening renal failure.  She had a problem embolism in the left leg.  She is always had a blood clot in the left leg.  She had been on Coumadin.  She now is on Lovenox..  She had a CT scan of the chest which showed progressive disease in her lung from her malignancy.  She did have a PET scan before she was admitted.  The PET scan actually showed a "mixed response" to therapy.  We have not treated her for quite a while.  She had a left perihilar lung mass which was larger.  She had actually decreased and some right lung lesions.  She had new hypermetabolic lesions in the left scapula, T12 and left ninth rib.  There is no evidence of metastatic disease in the abdomen or pelvis.  She actually had her nephrostomy tubes I think changed.  I know that Urology is saw her.  Nephrology saw her.  I thought she was going to need dialysis.  She does not want dialysis.  She was having pain in the right lower leg.  We ultimately did an MRI which showed a mass that was next to the tibia.  This could be metastatic disease or I suppose this could be a primary sarcoma.  We are going to have to biopsy this.  She is agreeable to this.  I spoke with Jaime Gomez of Radiation Oncology.  He is willing to radiate this.  She does look quite good.  She is having no fevers.  There is no bleeding.  She was transfused in the hospital.  As always, her blood sugars are horrible.  Today, her blood sugar was 384.  I told her  that we really have to get her blood sugar under better control.  I did talk to her about chemotherapy.  We really do not have a lot of options that her left.  I told her that 1 option would be using liposomal Adriamycin.  Her echocardiogram in the hospital showed that she had a very good ejection fraction of 55-60%.  She does have a Port-A-Cath now.  This will certainly make life easier.  She does not want to have any chemotherapy right now.  She will prefer to wait until after she has the radiation for her right tibial lesion.  Of note, her CA125 that was done in the hospital was 383.  This continues to rise gradually.  Currently, her performance status is probably ECOG 1.   Medications:  Allergies as of 02/15/2023       Reactions   Paclitaxel Anaphylaxis, Swelling, Other (See Comments)   Made everything swell   Taxotere [docetaxel] Anaphylaxis, Swelling, Other (See Comments)   Throat swelling        Medication List        Accurate as of February 15, 2023  5:06 PM. If you have any questions, ask your nurse or doctor.          acetaminophen 500 MG tablet Commonly  known as: TYLENOL Take 500 mg by mouth every 6 (six) hours as needed for mild pain.   amoxicillin-clavulanate 875-125 MG tablet Commonly known as: AUGMENTIN Take 1 tablet by mouth 2 (two) times daily.   carvedilol 12.5 MG tablet Commonly known as: Coreg Take 1 tablet (12.5 mg total) by mouth 2 (two) times daily with a meal.   dexamethasone 4 MG tablet Commonly known as: DECADRON Take 1 tablet (4 mg total) by mouth daily.   enoxaparin 60 MG/0.6ML injection Commonly known as: LOVENOX Inject 0.6 mLs (60 mg total) into the skin daily.   famotidine 20 MG tablet Commonly known as: PEPCID Take 20 mg by mouth 2 (two) times daily as needed for heartburn or indigestion.   FreeStyle Libre 3 Sensor Misc 1 Act by Does not apply route daily. Place 1 sensor on the skin every 14 days. Use to check glucose  continuously   furosemide 20 MG tablet Commonly known as: LASIX TAKE 1 TABLET BY MOUTH EVERY DAY What changed:  when to take this reasons to take this   gabapentin 300 MG capsule Commonly known as: NEURONTIN Take 1 capsule (300 mg total) by mouth 3 (three) times daily.   insulin detemir 100 UNIT/ML injection Commonly known as: LEVEMIR Inject 0.2 mLs (20 Units total) into the skin 2 (two) times daily. What changed:  how much to take when to take this additional instructions   oxybutynin 10 MG 24 hr tablet Commonly known as: DITROPAN-XL Take 10 mg by mouth daily.   oxycodone 5 MG capsule Commonly known as: OXY-IR Take 1 capsule (5 mg total) by mouth every 4 (four) hours as needed for pain.   polyethylene glycol 17 g packet Commonly known as: MIRALAX / GLYCOLAX Take 17 g by mouth daily as needed for mild constipation.   polyethylene glycol 17 g packet Commonly known as: MIRALAX / GLYCOLAX Take 17 g by mouth 2 (two) times daily.   prochlorperazine 10 MG tablet Commonly known as: COMPAZINE Take 10 mg by mouth every 6 (six) hours as needed for nausea or vomiting.   sodium bicarbonate 650 MG tablet Take 1 tablet (650 mg total) by mouth 3 (three) times daily.   venlafaxine XR 37.5 MG 24 hr capsule Commonly known as: Effexor XR Take 1 capsule (37.5 mg total) by mouth daily with breakfast.        Allergies:  Allergies  Allergen Reactions   Paclitaxel Anaphylaxis, Swelling and Other (See Comments)    Made everything swell   Taxotere [Docetaxel] Anaphylaxis, Swelling and Other (See Comments)    Throat swelling    Past Medical History, Surgical history, Social history, and Family History were reviewed and updated.  Review of Systems: Review of Systems  Constitutional: Negative.   HENT: Negative.    Eyes: Negative.   Respiratory: Negative.    Cardiovascular: Negative.   Gastrointestinal: Negative.   Genitourinary:  Positive for flank pain.  Musculoskeletal:   Positive for back pain.  Skin: Negative.   Neurological: Negative.   Endo/Heme/Allergies: Negative.   Psychiatric/Behavioral: Negative.     Marland Kitchen   Physical Exam:  height is 5\' 4"  (1.626 m) and weight is 135 lb (61.2 kg). Her oral temperature is 97.9 F (36.6 C). Her blood pressure is 172/76 (abnormal) and her pulse is 58 (abnormal). Her respiration is 18 and oxygen saturation is 99%.   Wt Readings from Last 3 Encounters:  02/15/23 135 lb (61.2 kg)  02/08/23 135 lb (61.2 kg)  02/07/23 138 lb (  62.6 kg)    Physical Exam Vitals reviewed.  HENT:     Head: Normocephalic and atraumatic.  Eyes:     Pupils: Pupils are equal, round, and reactive to light.  Cardiovascular:     Rate and Rhythm: Normal rate and regular rhythm.     Heart sounds: Normal heart sounds.  Pulmonary:     Effort: Pulmonary effort is normal.     Breath sounds: Normal breath sounds.  Abdominal:     General: Bowel sounds are normal.     Palpations: Abdomen is soft.  Musculoskeletal:        General: No tenderness or deformity. Normal range of motion.     Cervical back: Normal range of motion.  Lymphadenopathy:     Cervical: No cervical adenopathy.  Skin:    General: Skin is warm and dry.     Findings: No erythema or rash.  Neurological:     Mental Status: She is alert and oriented to person, place, and time.  Psychiatric:        Behavior: Behavior normal.        Thought Content: Thought content normal.        Judgment: Judgment normal.      Lab Results  Component Value Date   WBC 10.0 02/15/2023   HGB 10.1 (L) 02/15/2023   HCT 31.6 (L) 02/15/2023   MCV 96.0 02/15/2023   PLT 196 02/15/2023   Lab Results  Component Value Date   FERRITIN 1,812 (H) 11/16/2022   IRON 28 11/16/2022   TIBC 211 (L) 11/16/2022   UIBC 183 11/16/2022   IRONPCTSAT 13 11/16/2022   Lab Results  Component Value Date   RETICCTPCT 0.7 10/11/2022   RBC 3.29 (L) 02/15/2023   No results found for: "KPAFRELGTCHN", "LAMBDASER",  "KAPLAMBRATIO" No results found for: "IGGSERUM", "IGA", "IGMSERUM" No results found for: "TOTALPROTELP", "ALBUMINELP", "A1GS", "A2GS", "BETS", "BETA2SER", "GAMS", "MSPIKE", "SPEI"   Chemistry      Component Value Date/Time   NA 134 (L) 02/15/2023 1510   NA 143 01/01/2023 1236   K 5.5 (H) 02/15/2023 1510   CL 101 02/15/2023 1510   CO2 20 (L) 02/15/2023 1510   BUN 91 (H) 02/15/2023 1510   BUN 57 (H) 01/01/2023 1236   CREATININE 2.86 (H) 02/15/2023 1510      Component Value Date/Time   CALCIUM 9.7 02/15/2023 1510   ALKPHOS 77 02/15/2023 1510   AST 6 (L) 02/15/2023 1510   ALT 5 02/15/2023 1510   BILITOT 0.4 02/15/2023 1510       Impression and Plan: Mr. Gubler is a 57 yo caucasian female with metastatic endometrial cancer.  She has been on quite a few treatments.    I am just very impressed as to how good she looks.  We certainly can use systemic therapy on her.  I realize that there are quite a lot of things going on with her.  Again, I told her that the blood sugars really had to get under better control after going to think that chemotherapy will help.  I do think that a biopsy of this tibial mass would be helpful.  We have never been able to get a biopsy on her and send off for molecular studies.  I think this would certainly be reasonable.  She is in agreement.  We will go ahead with some radiation first for the right tibial mass.  This will hopefully help with any pain that she is having.  I am just happy  that her quality of life is doing better.  We will plan to get her back probably in about 2 or 3 weeks.  By then, we should have results back from the biopsy and hopefully the molecular analysis.     [name], MD 7/25/20245:06 PM

## 2023-02-16 ENCOUNTER — Telehealth: Payer: Self-pay | Admitting: Radiation Oncology

## 2023-02-16 NOTE — Progress Notes (Signed)
[name], MD  Leodis Rains D She is not on warfarin.  She is on Lovenox.  She can hold the Lovenox the day before her procedure.  Cindee Lame

## 2023-02-16 NOTE — Telephone Encounter (Signed)
7/26 @ 9:35 am Left voicemail for patient to call our office to be schedule for consult with Dr. Roselind Messier.

## 2023-02-16 NOTE — Progress Notes (Signed)
Jaime Balm, MD  Leodis Rains D PROCEDURE / BIOPSY REVIEW Date: 02/16/23  Requested Biopsy site: R calf mass Reason for request: r/o met Imaging review: Best seen on MR 02/09/23 and [place] 7/18  Decision: Approved Imaging modality to perform: Ultrasound Schedule with: Patient preference (Local vs Mod Sed) Schedule for: Any VIR  Additional comments:   Please contact me with questions, concerns, or if issue pertaining to this request arise.  Jaime Jaime Balm, MD [facility]

## 2023-02-18 ENCOUNTER — Other Ambulatory Visit: Payer: Self-pay | Admitting: Hematology & Oncology

## 2023-02-19 ENCOUNTER — Other Ambulatory Visit: Payer: Self-pay | Admitting: Radiology

## 2023-02-19 ENCOUNTER — Telehealth: Payer: Self-pay | Admitting: Nurse Practitioner

## 2023-02-19 ENCOUNTER — Encounter: Payer: Self-pay | Admitting: Family

## 2023-02-19 NOTE — Telephone Encounter (Signed)
Scheduled appointment per referral message. Left voicemail for patient.

## 2023-02-20 ENCOUNTER — Encounter (HOSPITAL_COMMUNITY): Payer: Self-pay

## 2023-02-20 ENCOUNTER — Ambulatory Visit (HOSPITAL_COMMUNITY)
Admission: RE | Admit: 2023-02-20 | Discharge: 2023-02-20 | Disposition: A | Discharge status: Home / Self Care | Payer: 59 | Source: Ambulatory Visit | Attending: Interventional Radiology | Admitting: Interventional Radiology

## 2023-02-20 ENCOUNTER — Ambulatory Visit (HOSPITAL_COMMUNITY): Admission: RE | Admit: 2023-02-20 | Payer: 59 | Source: Ambulatory Visit

## 2023-02-20 ENCOUNTER — Emergency Department (HOSPITAL_COMMUNITY): Payer: 59

## 2023-02-20 ENCOUNTER — Other Ambulatory Visit: Payer: Self-pay

## 2023-02-20 ENCOUNTER — Emergency Department (HOSPITAL_COMMUNITY)
Admission: EM | Admit: 2023-02-20 | Discharge: 2023-02-20 | Disposition: A | Discharge status: Home / Self Care | Payer: 59 | Attending: Emergency Medicine | Admitting: Emergency Medicine

## 2023-02-20 DIAGNOSIS — M25552 Pain in left hip: Secondary | ICD-10-CM | POA: Diagnosis not present

## 2023-02-20 DIAGNOSIS — K0889 Other specified disorders of teeth and supporting structures: Secondary | ICD-10-CM | POA: Insufficient documentation

## 2023-02-20 DIAGNOSIS — S0990XA Unspecified injury of head, initial encounter: Secondary | ICD-10-CM | POA: Insufficient documentation

## 2023-02-20 DIAGNOSIS — M25559 Pain in unspecified hip: Secondary | ICD-10-CM | POA: Diagnosis not present

## 2023-02-20 DIAGNOSIS — Z043 Encounter for examination and observation following other accident: Secondary | ICD-10-CM | POA: Diagnosis not present

## 2023-02-20 DIAGNOSIS — M542 Cervicalgia: Secondary | ICD-10-CM | POA: Diagnosis not present

## 2023-02-20 DIAGNOSIS — E119 Type 2 diabetes mellitus without complications: Secondary | ICD-10-CM | POA: Diagnosis not present

## 2023-02-20 DIAGNOSIS — Z452 Encounter for adjustment and management of vascular access device: Secondary | ICD-10-CM | POA: Diagnosis not present

## 2023-02-20 DIAGNOSIS — W19XXXA Unspecified fall, initial encounter: Secondary | ICD-10-CM | POA: Diagnosis not present

## 2023-02-20 DIAGNOSIS — Z79899 Other long term (current) drug therapy: Secondary | ICD-10-CM | POA: Insufficient documentation

## 2023-02-20 DIAGNOSIS — I10 Essential (primary) hypertension: Secondary | ICD-10-CM | POA: Diagnosis not present

## 2023-02-20 DIAGNOSIS — Z794 Long term (current) use of insulin: Secondary | ICD-10-CM | POA: Insufficient documentation

## 2023-02-20 DIAGNOSIS — R9082 White matter disease, unspecified: Secondary | ICD-10-CM | POA: Diagnosis not present

## 2023-02-20 DIAGNOSIS — R0789 Other chest pain: Secondary | ICD-10-CM | POA: Diagnosis not present

## 2023-02-20 DIAGNOSIS — Z7984 Long term (current) use of oral hypoglycemic drugs: Secondary | ICD-10-CM | POA: Diagnosis not present

## 2023-02-20 DIAGNOSIS — R079 Chest pain, unspecified: Secondary | ICD-10-CM | POA: Insufficient documentation

## 2023-02-20 DIAGNOSIS — M8580 Other specified disorders of bone density and structure, unspecified site: Secondary | ICD-10-CM | POA: Diagnosis not present

## 2023-02-20 DIAGNOSIS — N390 Urinary tract infection, site not specified: Secondary | ICD-10-CM

## 2023-02-20 DIAGNOSIS — M160 Bilateral primary osteoarthritis of hip: Secondary | ICD-10-CM | POA: Diagnosis not present

## 2023-02-20 MED ORDER — ACETAMINOPHEN 325 MG PO TABS
650.0000 mg | ORAL_TABLET | Freq: Once | ORAL | Status: AC
  Administered 2023-02-20: 650 mg via ORAL
  Filled 2023-02-20: qty 2

## 2023-02-20 NOTE — ED Notes (Signed)
Approx [id number] call was announced pt fell and is floor near radiology. Upon arrival, Pt had been assisted to w/c nose bleeding, pain in left ribs, elbow pain, unsure about head injury since her nose is bleeding, she denies hitting head. C-collar placed. Pt alert and oriented. She thinks she tripped over her foot, she did have a cane with her. Pt transported via w/c to ED. Caregiver notified.  Pt has been off her Coumadin x 3 days for test this morning.

## 2023-02-20 NOTE — ED Provider Notes (Signed)
[place] EMERGENCY DEPARTMENT AT [facility] Provider Note   CSN: [id number] Arrival date & time: 02/20/23  6440     History HTN,DM,DVT Chief Complaint  Patient presents with   [facility] two teeth broke.    Mouth Injury    Front teeth broke   Chest Pain    Jaime Gomez is a 57 y.o. female.  57 y.o female with a PMH of HTN,DM, DVT on warfarin presents to the ED from the radiology department.  History obtained by her caretaker at the bedside, who reports patient was ambulating to her procedure, when suddenly she lost her balance as she was walking too fast she fell on the ground striking her head.  She thinks she might have tripped over her foot with her cane.  There was no loss of consciousness, she has been off her Coumadin for the past 3 days for testing this morning.  She endorses pain along the left side of her hip and entire body.  She does have some bruising noted on her abdomen from her prior insulin ejections.  Has not taken any medication for improvement in symptoms.  Did have a soft collar applied by nursing staff.  No other complaints reported.  The history is provided by the patient.  Fall This is a new problem. The current episode started less than 1 hour ago. The problem occurs constantly. Associated symptoms include chest pain. Pertinent negatives include no abdominal pain, no headaches and no shortness of breath. The symptoms are aggravated by walking. Nothing relieves the symptoms. She has tried nothing for the symptoms.  Mouth Injury Associated symptoms include chest pain. Pertinent negatives include no abdominal pain, no headaches and no shortness of breath.  Chest Pain Associated symptoms: no abdominal pain, no fever, no headache, no nausea, no shortness of breath and no vomiting        Home Medications Prior to Admission medications   Medication Sig Start Date End Date Taking? Authorizing Provider  acetaminophen (TYLENOL) 500 MG tablet Take  500 mg by mouth every 6 (six) hours as needed for mild pain.    [provider]  amoxicillin-clavulanate (AUGMENTIN) 875-125 MG tablet Take 1 tablet by mouth 2 (two) times daily. 02/10/23   Standley Brooking, MD  carvedilol (COREG) 12.5 MG tablet TAKE 1 TABLET (12.5MG  TOTAL) BY MOUTH TWICE A DAY WITH MEALS 02/19/23   [name], MD  Continuous Glucose Sensor (FREESTYLE LIBRE 3 SENSOR) MISC 1 Act by Does not apply route daily. Place 1 sensor on the skin every 14 days. Use to check glucose continuously Patient not taking: Reported on 02/08/2023 12/26/22   Karie Georges, MD  dexamethasone (DECADRON) 4 MG tablet Take 1 tablet (4 mg total) by mouth daily. 02/11/23   Standley Brooking, MD  enoxaparin (LOVENOX) 60 MG/0.6ML injection Inject 0.6 mLs (60 mg total) into the skin daily. 02/10/23 05/11/23  Standley Brooking, MD  famotidine (PEPCID) 20 MG tablet Take 20 mg by mouth 2 (two) times daily as needed for heartburn or indigestion.    [provider]  fondaparinux (ARIXTRA) 7.5 MG/0.6ML SOLN injection INJECT 0.6 MLS (7.5 MG TOTAL) INTO THE SKIN DAILY. 02/19/23   [name], MD  furosemide (LASIX) 20 MG tablet TAKE 1 TABLET BY MOUTH EVERY DAY Patient taking differently: Take 20 mg by mouth daily as needed for fluid or edema. 01/22/23   Karie Georges, MD  gabapentin (NEURONTIN) 300 MG capsule Take 1  capsule (300 mg total) by mouth 3 (three) times daily. 02/14/23   [name], MD  insulin detemir (LEVEMIR) 100 UNIT/ML injection Inject 0.2 mLs (20 Units total) into the skin 2 (two) times daily. Patient taking differently: Inject 10-20 Units into the skin See admin instructions. Inject 10-20 units into the skin two times a day before meals, per sliding scale 08/30/22   [name], MD  oxybutynin (DITROPAN-XL) 10 MG 24 hr tablet Take 10 mg by mouth daily. 08/27/22   [provider]  oxycodone (OXY-IR) 5 MG capsule Take 1 capsule (5 mg total) by mouth every 4  (four) hours as needed for pain. 12/13/22   [name], MD  polyethylene glycol (MIRALAX / GLYCOLAX) 17 g packet Take 17 g by mouth daily as needed for mild constipation. 09/05/21   Pokhrel, Rebekah Chesterfield, MD  polyethylene glycol (MIRALAX / GLYCOLAX) 17 g packet Take 17 g by mouth 2 (two) times daily. 02/10/23   Standley Brooking, MD  prochlorperazine (COMPAZINE) 10 MG tablet Take 10 mg by mouth every 6 (six) hours as needed for nausea or vomiting. 08/18/21   [provider]  sodium bicarbonate 650 MG tablet Take 1 tablet (650 mg total) by mouth 3 (three) times daily. 02/10/23   Standley Brooking, MD  venlafaxine XR (EFFEXOR XR) 37.5 MG 24 hr capsule Take 1 capsule (37.5 mg total) by mouth daily with breakfast. 12/26/22   Karie Georges, MD      Allergies    Paclitaxel and Taxotere [docetaxel]    Review of Systems   Review of Systems  Constitutional:  Negative for chills and fever.  HENT:  Negative for sore throat.   Respiratory:  Negative for shortness of breath.   Cardiovascular:  Positive for chest pain.  Gastrointestinal:  Negative for abdominal pain, nausea and vomiting.  Genitourinary:  Negative for flank pain.  Neurological:  Negative for syncope and headaches.  All other systems reviewed and are negative.   Physical Exam Updated Vital Signs BP (!) 176/88 (BP Location: Right Arm)   Pulse (!) 57   Temp 97.8 F (36.6 C) (Oral)   Resp 13   Ht 5\' 4"  (1.626 m)   Wt 61.2 kg   SpO2 98%   BMI 23.17 kg/m  Physical Exam Vitals reviewed.  Constitutional:      Appearance: She is well-developed.  HENT:     Head: Normocephalic.     Mouth/Throat:     Dentition: Dental tenderness present.     Comments: Chipped two front teeth with no active bleeding noted.  Neck:     Comments: Soft collar in place Cardiovascular:     Rate and Rhythm: Normal rate.  Pulmonary:     Effort: Pulmonary effort is normal.     Breath sounds: No decreased breath sounds.  Chest:     Chest wall:  No tenderness.  Abdominal:     Palpations: Abdomen is soft.  Musculoskeletal:     Right lower leg: No edema.     Left lower leg: No edema.  Skin:    General: Skin is warm and dry.  Neurological:     Mental Status: She is alert and oriented to person, place, and time.     ED Results / Procedures / Treatments   Labs (all labs ordered are listed, but only abnormal results are displayed) Labs Reviewed - No data to display  EKG None  Radiology CT HEAD WO CONTRAST ( )  Result Date:  02/20/2023 CLINICAL DATA:  Fall, metastatic cervical cancer EXAM: CT HEAD WITHOUT CONTRAST CT CERVICAL SPINE WITHOUT CONTRAST TECHNIQUE: Multidetector CT imaging of the head and cervical spine was performed following the standard protocol without intravenous contrast. Multiplanar CT image reconstructions of the cervical spine were also generated. RADIATION DOSE REDUCTION: This exam was performed according to the departmental dose-optimization program which includes automated exposure control, adjustment of the mA and/or kV according to patient size and/or use of iterative reconstruction technique. COMPARISON:  None Available. FINDINGS: CT HEAD FINDINGS Brain: No evidence of acute infarction, hemorrhage, hydrocephalus, extra-axial collection or mass lesion/mass effect. Periventricular white matter hypodensity. Unchanged encephalomalacia of the posterior left centrum semiovale (series 3, image 18). Vascular: No hyperdense vessel or unexpected calcification. Skull: Normal. Negative for fracture or focal lesion. Sinuses/Orbits: No acute finding. Other: None. CT CERVICAL SPINE FINDINGS Alignment: Normal. Skull base and vertebrae: No acute fracture. No primary bone lesion or focal pathologic process. Soft tissues and spinal canal: No prevertebral fluid or swelling. No visible canal hematoma. Disc levels: Mild-to-moderate multilevel disc degenerative disease, worst at C6-C7. Upper chest: Masslike perihilar consolidations of  the lungs, incompletely imaged (series 11, image 12). Other: None. IMPRESSION: 1. No acute intracranial pathology. 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 2. No fracture or static subluxation of the cervical spine. 3. Mild-to-moderate multilevel disc degenerative disease, worst at C6-C7. 4. Masslike perihilar consolidations of the lungs, incompletely imaged. Please see recent dedicated imaging of the chest Electronically Signed   By: Jearld Lesch M.D.   On: 02/20/2023 11:20   CT Cervical Spine Wo Contrast  Result Date: 02/20/2023 CLINICAL DATA:  Fall, metastatic cervical cancer EXAM: CT HEAD WITHOUT CONTRAST CT CERVICAL SPINE WITHOUT CONTRAST TECHNIQUE: Multidetector CT imaging of the head and cervical spine was performed following the standard protocol without intravenous contrast. Multiplanar CT image reconstructions of the cervical spine were also generated. RADIATION DOSE REDUCTION: This exam was performed according to the departmental dose-optimization program which includes automated exposure control, adjustment of the mA and/or kV according to patient size and/or use of iterative reconstruction technique. COMPARISON:  None Available. FINDINGS: CT HEAD FINDINGS Brain: No evidence of acute infarction, hemorrhage, hydrocephalus, extra-axial collection or mass lesion/mass effect. Periventricular white matter hypodensity. Unchanged encephalomalacia of the posterior left centrum semiovale (series 3, image 18). Vascular: No hyperdense vessel or unexpected calcification. Skull: Normal. Negative for fracture or focal lesion. Sinuses/Orbits: No acute finding. Other: None. CT CERVICAL SPINE FINDINGS Alignment: Normal. Skull base and vertebrae: No acute fracture. No primary bone lesion or focal pathologic process. Soft tissues and spinal canal: No prevertebral fluid or swelling. No visible canal hematoma. Disc levels: Mild-to-moderate multilevel disc  degenerative disease, worst at C6-C7. Upper chest: Masslike perihilar consolidations of the lungs, incompletely imaged (series 11, image 12). Other: None. IMPRESSION: 1. No acute intracranial pathology. 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 2. No fracture or static subluxation of the cervical spine. 3. Mild-to-moderate multilevel disc degenerative disease, worst at C6-C7. 4. Masslike perihilar consolidations of the lungs, incompletely imaged. Please see recent dedicated imaging of the chest Electronically Signed   By: Jearld Lesch M.D.   On: 02/20/2023 11:20   DG Chest 1 View  Result Date: 02/20/2023 CLINICAL DATA:  Pain EXAM: CHEST  1 VIEW COMPARISON:  12/03/2022 FINDINGS: Underinflation. No pneumothorax or effusion. Normal cardiopericardial silhouette. Overlapping cardiac leads. Right IJ line in place with tip along the central SVC. Increasing  perihilar opacities is identified with some nodularity. Interstitial changes as well. IMPRESSION: Underinflation. Increasing perihilar masslike areas. Please correlate with prior workup. Chest port. Electronically Signed   By: Karen Kays M.D.   On: 02/20/2023 10:42   DG Pelvis 1-2 Views  Result Date: 02/20/2023 CLINICAL DATA:  Pain after fall EXAM: PELVIS - 1 VIEW COMPARISON:  None Available. FINDINGS: No fracture or dislocation. Hyperostosis. Degenerative changes seen of the left sacroiliac joint and right hip. Diffuse vascular calcifications identified. Bilateral indwelling ureteral stents are seen extending off the edge of the imaging field. Mesh identified. IMPRESSION: Degenerative changes.  Ureteral stents. Electronically Signed   By: Karen Kays M.D.   On: 02/20/2023 10:40    Procedures Procedures    Medications Ordered in ED Medications  acetaminophen (TYLENOL) tablet 650 mg (650 mg Oral Given 02/20/23 1038)    ED Course/ Medical Decision Making/ A&P                                  Medical Decision Making Amount and/or Complexity of Data Reviewed Radiology: ordered.  Risk OTC drugs.    Patient presents to the ED status post mechanical fall while arriving to the radiology department, according to her caretaker she was trying to follow the registration woman in suddenly slipped by tripping with her cane.  Did not strike her head, there was no loss of consciousness, she is currently on Coumadin and has been off of it for 3 days for her procedure this morning.  Had a soft collar to apply to her neck by nursing staff.  Plain films obtained here such as pelvis, chest did not show any acute findings.  She was going to the radiology department in order to obtain ureteral stent placement.  CT head along with CT cervical spine showed: 1. No acute intracranial pathology. Small-vessel white matter  disease and unchanged encephalomalacia of the posterior left centrum  semiovale.  2. No fracture or static subluxation of the cervical spine.  3. Mild-to-moderate multilevel disc degenerative disease, worst at  C6-C7.  4. Masslike perihilar consolidations of the lungs, incompletely  imaged. Please see recent dedicated imaging of the chest   She was given Tylenol for pain control, I discussed the results of her imaging with patient at length.  She did chipped both of her front teeth, she is moving all upper and lower extremities.  She does feel somewhat sore after the fall.  Recent Coumadin checked 5 days ago, this level was not checked today as this fall was mechanical with no dizziness or LOC or CP. She is hemodynamically stable.    Portions of this note were generated with Scientist, clinical (histocompatibility and immunogenetics). Dictation errors may occur despite best attempts at proofreading.   Final Clinical Impression(s) / ED Diagnoses Final diagnoses:  Fall, initial encounter    Rx / DC Orders ED Discharge Orders     None         Claude Manges, PA-C 02/20/23 1135    Gwyneth Sprout,  MD 02/20/23 [id number]

## 2023-02-20 NOTE — Discharge Instructions (Addendum)
The results of your CT head/neck were negative on todays visit.    You may apply ice or heat to your neck or back to help with symptoms.

## 2023-02-20 NOTE — ED Notes (Signed)
Patient states she broke her teeth when she fell

## 2023-02-21 NOTE — Progress Notes (Incomplete)
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 Mixed response to interval therapy. The left perihilar lung mass is larger and more hypermetabolic than on the previous PET-CT. The dominant right lung lesions demonstrate interval decreased hypermetabolic activity. The central component of the right lower lobe lesion is less well-defined, possibly due to surrounding treatment changes. 2. 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 (Of note, a subsequently demonstrated mass in the right lower leg is not included on this examination. Given the presence of other osseous metastases, this may represent a metastasis. Please refer to separate MRI report of 02/09/2023.) 3. No evidence of metastatic disease within the abdomen or pelvis. 4. Stable position of the left nephroureteral tube and right double-J ureteral stent. No hydronephrosis. 5.  Aortic Atherosclerosis (ICD10-I70.0).  MR TIBIA FIBULA RIGHT WO CONTRAST 02/09/2023  IMPRESSION: 1. Complex subperiosteal mass along the posteromedial aspect of the proximal right tibial metaphysis, corresponding with the mass seen on recent ultrasound. Contrast was not administered for this examination, although internal vascularity was noted on ultrasound, implying a solid lesion. There is resulting erosion of the adjacent tibial cortex with adjacent marrow edema and periosteal edema. Findings are suspicious for metastatic disease in this patient with a history of endometrial carcinoma. Differential includes infection with a subperiosteal abscess and adjacent tibial osteomyelitis. Consider tissue sampling. 2. No other soft tissue masses or fluid collections identified. 3. Nonspecific edema within the deep posterior compartment of  the right lower leg. 4. Known DVT is not well demonstrated by this examination.  Past/Anticipated chemotherapy by medical oncology, if any:  Jaime Macho, MD 02/15/2023 15:45  Impression and Plan: Jaime Gomez is a 57 yo caucasian female with metastatic endometrial cancer.  She has been on quite a few treatments.     I am just very impressed as to how good she looks.  We certainly can use systemic therapy on her.   I realize that there are quite a lot of things going on with her.  Again, I told her that the blood sugars really had to get under better control after going to think that chemotherapy will help.   I do think that a biopsy of this tibial mass would be helpful.  We have never been able to get a biopsy on her and send off for molecular studies.  I think this would certainly be reasonable.  She is in agreement.   We will go ahead with some radiation first for the right tibial mass.  This will hopefully help with any pain that she is having.   I am just happy that her quality of life is doing better.   We will plan to get her back probably in about 2 or 3 weeks.  By then, we should have results back from the biopsy and hopefully the molecular analysis.      Patient's main complaints related to symptomatic tumor(s) are:  She was having pain in the right lower leg. We ultimately did an MRI which showed a mass that was next to the tibia.   Pain on a scale of 0-10 is: ***    If Spine Met(s), symptoms, if any, include: Bowel/Bladder retention or incontinence (please describe): na Numbness or weakness in extremities (please describe): na Current Decadron regimen, if applicable: na  Ambulatory status? Walker?  Wheelchair?: ***  SAFETY ISSUES: Prior radiation? *** Pacemaker/ICD? *** Possible current pregnancy? no Is the patient on methotrexate? No   Additional Complaints / other details:  ***

## 2023-02-27 ENCOUNTER — Other Ambulatory Visit: Payer: Self-pay | Admitting: Radiology

## 2023-02-27 NOTE — H&P (Signed)
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  Latest nephrostogram on 01/09/2023 revealed severe distal left ureteral stricture.  She underwent successful distal  ureteroplasty and placement of a 10 French 22 cm nephroureteral catheter . The catheter was subsequently capped.  She presents today for follow-up nephrostogram with internal double-J nephroureteral stent placement.   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 failure 08/18/2021   Diabetes mellitus without complication (HCC) type 2    Endometrial cancer, FIGO stage IVB (HCC) 08/18/2021   Goals of care, counseling/discussion 08/18/2021   History of kidney stones    Hypertension    Leg DVT (deep venous thromboembolism), chronic, right (HCC) 08/18/2021   Ovarian cancer (HCC) 2013   Pneumonia 12/03/2022   Wears glasses    Past Surgical History:  Procedure Laterality Date   ABDOMINAL HYSTERECTOMY     BOTOX INJECTION N/A 01/12/2023   Procedure: BOTOX INJECTION INTO BLADDER;  Surgeon: Jannifer Hick, MD;  Location: [facility];  Service: Urology;  Laterality: N/A;   CYSTOSCOPY W/ RETROGRADES Right 01/12/2023   Procedure: CYSTOSCOPY WITH RIGHT  RETROGRADE PYELOGRAM/ RIGHT URETERAL STENT CHANGE;  Surgeon: Jannifer Hick, MD;  Location: [facility];  Service: Urology;  Laterality: Right;   CYSTOSCOPY W/ URETERAL STENT PLACEMENT Bilateral 09/01/2021   Procedure: CYSTOSCOPY WITH RETROGRADE PYELOGRAM/URETERAL STENT  PLACEMENT;  Surgeon: Jannifer Hick, MD;  Location: [facility];  Service: Urology;  Laterality: Bilateral;   CYSTOSCOPY WITH FULGERATION N/A 01/12/2023   Procedure: CYSTOSCOPY WITH FULGERATION;  Surgeon: Jannifer Hick, MD;  Location: [facility];  Service: Urology;  Laterality: N/A;   CYSTOSCOPY WITH STENT PLACEMENT Right 05/19/2022   Procedure: CYSTOSCOPY WITH STENT CHANGE;  Surgeon: Jannifer Hick, MD;  Location: [facility];  Service: Urology;  Laterality: Right;  ONLY NEEDS 30 MIN   HERNIA REPAIR     IR CONVERT LEFT NEPHROSTOMY TO NEPHROURETERAL CATH  11/02/2021   IR CONVERT LEFT NEPHROSTOMY TO NEPHROURETERAL CATH  01/09/2023   IR CV LINE INJECTION  08/23/2021   IR DIL URETER LEFT  01/09/2023   IR IMAGING GUIDED PORT INSERTION  11/14/2021   IR IMAGING GUIDED PORT INSERTION  01/09/2023   IR NEPHROSTOMY EXCHANGE LEFT  11/02/2021   IR NEPHROSTOMY EXCHANGE LEFT  01/20/2022   IR NEPHROSTOMY EXCHANGE LEFT  11/29/2022   IR NEPHROSTOMY PLACEMENT LEFT  09/03/2021   IR REMOVAL TUN ACCESS W/ PORT W/O FL MOD SED  11/14/2021   IR REMOVAL TUN ACCESS W/ PORT W/O FL MOD SED  12/04/2022      Allergies: Paclitaxel and Taxotere [docetaxel]  Medications: Prior to Admission medications   Medication Sig Start Date End Date Taking? Authorizing Provider  acetaminophen (TYLENOL) 500 MG tablet Take 500 mg by mouth every 6 (six) hours as needed for mild pain.    [provider]  amoxicillin-clavulanate (AUGMENTIN) 875-125 MG tablet Take 1 tablet by mouth 2 (two) times daily. 02/10/23   Standley Brooking, MD  carvedilol (COREG) 12.5 MG tablet TAKE 1 TABLET (12.5MG  TOTAL) BY MOUTH  TWICE A DAY WITH MEALS 02/19/23   [name], MD  Continuous Glucose Sensor (FREESTYLE LIBRE 3 SENSOR) MISC 1 Act by Does not apply route daily. Place 1 sensor on the skin every 14 days. Use to check glucose continuously Patient not taking: Reported on 02/08/2023 12/26/22   Karie Georges, MD  dexamethasone (DECADRON) 4 MG  tablet Take 1 tablet (4 mg total) by mouth daily. 02/11/23   Standley Brooking, MD  enoxaparin (LOVENOX) 60 MG/0.6ML injection Inject 0.6 mLs (60 mg total) into the skin daily. 02/10/23 05/11/23  Standley Brooking, MD  famotidine (PEPCID) 20 MG tablet Take 20 mg by mouth 2 (two) times daily as needed for heartburn or indigestion.    [provider]  fondaparinux (ARIXTRA) 7.5 MG/0.6ML SOLN injection INJECT 0.6 MLS (7.5 MG TOTAL) INTO THE SKIN DAILY. 02/19/23   [name], MD  furosemide (LASIX) 20 MG tablet TAKE 1 TABLET BY MOUTH EVERY DAY Patient taking differently: Take 20 mg by mouth daily as needed for fluid or edema. 01/22/23   Karie Georges, MD  gabapentin (NEURONTIN) 300 MG capsule Take 1 capsule (300 mg total) by mouth 3 (three) times daily. 02/14/23   [name], MD  insulin detemir (LEVEMIR) 100 UNIT/ML injection Inject 0.2 mLs (20 Units total) into the skin 2 (two) times daily. Patient taking differently: Inject 10-20 Units into the skin See admin instructions. Inject 10-20 units into the skin two times a day before meals, per sliding scale 08/30/22   [name], MD  oxybutynin (DITROPAN-XL) 10 MG 24 hr tablet Take 10 mg by mouth daily. 08/27/22   [provider]  oxycodone (OXY-IR) 5 MG capsule Take 1 capsule (5 mg total) by mouth every 4 (four) hours as needed for pain. 12/13/22   [name], MD  polyethylene glycol (MIRALAX / GLYCOLAX) 17 g packet Take 17 g by mouth daily as needed for mild constipation. 09/05/21   Pokhrel, Rebekah Chesterfield, MD  polyethylene glycol (MIRALAX / GLYCOLAX) 17 g packet Take 17 g by mouth 2 (two) times daily. 02/10/23   Standley Brooking, MD  prochlorperazine (COMPAZINE) 10 MG tablet Take 10 mg by mouth every 6 (six) hours as needed for nausea or vomiting. 08/18/21   [provider]  sodium bicarbonate 650 MG tablet Take 1 tablet (650 mg total) by mouth 3 (three) times daily. 02/10/23   Standley Brooking, MD  venlafaxine XR  (EFFEXOR XR) 37.5 MG 24 hr capsule Take 1 capsule (37.5 mg total) by mouth daily with breakfast. 12/26/22   Karie Georges, MD     Vital Signs:    Code Status:   Physical Exam  Imaging: No results found.  Labs:  CBC: Recent Labs    02/08/23 0922 02/09/23 0500 02/10/23 0401 02/15/23 1510  WBC 6.0 6.1 5.2 10.0  HGB 8.6* 7.8* 7.6* 10.1*  HCT 25.3* 24.2* 23.7* 31.6*  PLT 181 181 170 196    COAGS: Recent Labs    11/28/22 0844 11/30/22 1204 12/09/22 0359 01/02/23 1142 01/09/23 0800 01/12/23 1104 02/01/23 1446 02/08/23 0922 02/15/23 1511  INR 1.2 1.1   < > 2.7*   < > 1.3* 1.4* 1.4* 1.2  APTT 49* 41*  --  61*  --   --   --   --   --    < > = values in this interval not displayed.    BMP: Recent Labs    02/08/23 0922 02/09/23 0500  02/10/23 0401 02/15/23 1510  NA 134* 137 140 134*  K 4.8 4.2 5.0 5.5*  CL 107 116* 113* 101  CO2 16* 15* 17* 20*  GLUCOSE 229* 56* 176* 384*  BUN 104* 89* 83* 91*  CALCIUM 9.0 8.4* 9.0 9.7  CREATININE 3.59* 3.07* 2.83* 2.86*  GFRNONAA 14* 17* 19* 19*    LIVER FUNCTION TESTS: Recent Labs    01/02/23 1142 02/01/23 1446 02/08/23 0922 02/09/23 0500 02/10/23 0401 02/15/23 1510  BILITOT 0.4 0.4 0.2*  --   --  0.4  AST 10* 9* 12*  --   --  6*  ALT 9 5 10   --   --  5  ALKPHOS 91 91 95  --   --  77  PROT 7.4 7.9 7.6  --   --  7.6  ALBUMIN 3.1* 3.8 3.1* 2.8* 2.8* 3.9    Assessment and Plan: 57 year old female with history of anemia, diabetes, nephrolithiasis, hypertension, DVT, ovarian cancer 2013, metastatic endometrial carcinoma 2023 with bilateral obstructive uropathy post right-sided ureteral stent placement with failed attempt at left-sided stent placement; 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  Latest nephrostogram on 01/09/2023 revealed severe distal left ureteral stricture.  She underwent successful distal  ureteroplasty and placement of a 10 French 22 cm  nephroureteral catheter . The catheter was subsequently capped.  She presents today for follow-up nephrostogram with internal double-J nephroureteral stent placement.  Details/risks of procedure, including but not limited to, internal bleeding, infection, injury to adjacent structures, inability to place stent discussed with patient with understanding consent.   Electronically Signed: D. Jeananne Rama, PA-C 02/27/2023, 4:51 PM   I spent a total of 20 minutes at the the patient's bedside AND on the patient's hospital floor or unit, greater than 50% of which was counseling/coordinating care for left nephrostogram with possible internal double-J nephroureteral stent placement

## 2023-02-28 ENCOUNTER — Ambulatory Visit (HOSPITAL_COMMUNITY)
Admission: RE | Admit: 2023-02-28 | Discharge: 2023-02-28 | Disposition: A | Discharge status: Home / Self Care | Payer: 59 | Source: Ambulatory Visit | Attending: Interventional Radiology | Admitting: Interventional Radiology

## 2023-02-28 ENCOUNTER — Other Ambulatory Visit: Payer: Self-pay

## 2023-02-28 ENCOUNTER — Encounter (HOSPITAL_COMMUNITY): Payer: Self-pay

## 2023-02-28 DIAGNOSIS — C541 Malignant neoplasm of endometrium: Secondary | ICD-10-CM | POA: Diagnosis not present

## 2023-02-28 DIAGNOSIS — Z8543 Personal history of malignant neoplasm of ovary: Secondary | ICD-10-CM | POA: Insufficient documentation

## 2023-02-28 DIAGNOSIS — E119 Type 2 diabetes mellitus without complications: Secondary | ICD-10-CM | POA: Diagnosis not present

## 2023-02-28 DIAGNOSIS — Z436 Encounter for attention to other artificial openings of urinary tract: Secondary | ICD-10-CM | POA: Diagnosis not present

## 2023-02-28 DIAGNOSIS — N131 Hydronephrosis with ureteral stricture, not elsewhere classified: Secondary | ICD-10-CM | POA: Insufficient documentation

## 2023-02-28 DIAGNOSIS — D649 Anemia, unspecified: Secondary | ICD-10-CM | POA: Diagnosis not present

## 2023-02-28 DIAGNOSIS — N135 Crossing vessel and stricture of ureter without hydronephrosis: Secondary | ICD-10-CM

## 2023-02-28 DIAGNOSIS — Z66 Do not resuscitate: Secondary | ICD-10-CM | POA: Diagnosis not present

## 2023-02-28 DIAGNOSIS — Y731 Therapeutic (nonsurgical) and rehabilitative gastroenterology and urology devices associated with adverse incidents: Secondary | ICD-10-CM | POA: Insufficient documentation

## 2023-02-28 DIAGNOSIS — N390 Urinary tract infection, site not specified: Secondary | ICD-10-CM | POA: Insufficient documentation

## 2023-02-28 DIAGNOSIS — T83512A Infection and inflammatory reaction due to nephrostomy catheter, initial encounter: Secondary | ICD-10-CM | POA: Insufficient documentation

## 2023-02-28 DIAGNOSIS — I10 Essential (primary) hypertension: Secondary | ICD-10-CM | POA: Diagnosis not present

## 2023-02-28 HISTORY — PX: IR URETERAL STENT PLACEMENT EXISTING ACCESS LEFT: IMG6073

## 2023-02-28 LAB — CBC WITH DIFFERENTIAL/PLATELET
Abs Immature Granulocytes: 0.33 10*3/uL — ABNORMAL HIGH (ref 0.00–0.07)
Basophils Absolute: 0 10*3/uL (ref 0.0–0.1)
Basophils Relative: 0 %
Eosinophils Absolute: 0.1 10*3/uL (ref 0.0–0.5)
Eosinophils Relative: 1 %
HCT: 33.7 % — ABNORMAL LOW (ref 36.0–46.0)
Hemoglobin: 10.5 g/dL — ABNORMAL LOW (ref 12.0–15.0)
Immature Granulocytes: 3 %
Lymphocytes Relative: 6 %
Lymphs Abs: 0.6 10*3/uL — ABNORMAL LOW (ref 0.7–4.0)
MCH: 30.1 pg (ref 26.0–34.0)
MCHC: 31.2 g/dL (ref 30.0–36.0)
MCV: 96.6 fL (ref 80.0–100.0)
Monocytes Absolute: 0.7 10*3/uL (ref 0.1–1.0)
Monocytes Relative: 7 %
Neutro Abs: 8.4 10*3/uL — ABNORMAL HIGH (ref 1.7–7.7)
Neutrophils Relative %: 83 %
Platelets: 170 10*3/uL (ref 150–400)
RBC: 3.49 MIL/uL — ABNORMAL LOW (ref 3.87–5.11)
RDW: 14.4 % (ref 11.5–15.5)
WBC: 10.1 10*3/uL (ref 4.0–10.5)
nRBC: 0 % (ref 0.0–0.2)

## 2023-02-28 LAB — BASIC METABOLIC PANEL
Anion gap: 10 (ref 5–15)
BUN: 128 mg/dL — ABNORMAL HIGH (ref 6–20)
CO2: 17 mmol/L — ABNORMAL LOW (ref 22–32)
Calcium: 8.9 mg/dL (ref 8.9–10.3)
Chloride: 109 mmol/L (ref 98–111)
Creatinine, Ser: 2.76 mg/dL — ABNORMAL HIGH (ref 0.44–1.00)
GFR, Estimated: 19 mL/min — ABNORMAL LOW (ref >60)
Glucose, Bld: 73 mg/dL (ref 70–99)
Potassium: 4.5 mmol/L (ref 3.5–5.1)
Sodium: 136 mmol/L (ref 135–145)

## 2023-02-28 LAB — GLUCOSE, CAPILLARY
Glucose-Capillary: 62 mg/dL — ABNORMAL LOW (ref 70–99)
Glucose-Capillary: 69 mg/dL — ABNORMAL LOW (ref 70–99)
Glucose-Capillary: 75 mg/dL (ref 70–99)
Glucose-Capillary: 78 mg/dL (ref 70–99)
Glucose-Capillary: 83 mg/dL (ref 70–99)

## 2023-02-28 LAB — PROTIME-INR
INR: 1.1 (ref 0.8–1.2)
Prothrombin Time: 14.3 seconds (ref 11.4–15.2)

## 2023-02-28 MED ORDER — DEXTROSE 50 % IV SOLN
INTRAVENOUS | Status: AC
  Filled 2023-02-28: qty 50

## 2023-02-28 MED ORDER — DEXTROSE 50 % IV SOLN
12.5000 g | Freq: Once | INTRAVENOUS | Status: AC
  Administered 2023-02-28: 12.5 g via INTRAVENOUS

## 2023-02-28 MED ORDER — SODIUM CHLORIDE 0.9 % IV SOLN
1.0000 g | Freq: Once | INTRAVENOUS | Status: DC
  Filled 2023-02-28: qty 10

## 2023-02-28 MED ORDER — MIDAZOLAM HCL 2 MG/2ML IJ SOLN
INTRAMUSCULAR | Status: AC | PRN
  Administered 2023-02-28: 1 mg via INTRAVENOUS

## 2023-02-28 MED ORDER — DEXTROSE 50 % IV SOLN
25.0000 mL | Freq: Once | INTRAVENOUS | Status: AC
  Administered 2023-02-28: 25 mL via INTRAVENOUS

## 2023-02-28 MED ORDER — FENTANYL CITRATE (PF) 100 MCG/2ML IJ SOLN
INTRAMUSCULAR | Status: AC | PRN
  Administered 2023-02-28: 50 ug via INTRAVENOUS

## 2023-02-28 MED ORDER — SODIUM CHLORIDE 0.9 % IV SOLN: INTRAVENOUS | Status: DC

## 2023-02-28 MED ORDER — FENTANYL CITRATE (PF) 100 MCG/2ML IJ SOLN
INTRAMUSCULAR | Status: AC
  Filled 2023-02-28: qty 2

## 2023-02-28 MED ORDER — IOHEXOL 300 MG/ML  SOLN
50.0000 mL | Freq: Once | INTRAMUSCULAR | Status: AC | PRN
  Administered 2023-02-28: 10 mL

## 2023-02-28 MED ORDER — MIDAZOLAM HCL 2 MG/2ML IJ SOLN
INTRAMUSCULAR | Status: AC
  Filled 2023-02-28: qty 4

## 2023-02-28 MED ORDER — SODIUM CHLORIDE 0.9 % IV SOLN
INTRAVENOUS | Status: AC
  Administered 2023-02-28: 2 g
  Filled 2023-02-28: qty 20

## 2023-02-28 NOTE — Procedures (Signed)
Interventional Radiology Procedure Note  Procedure:   Image guided exchange of left percutaneous nephroureteral drain for an internalized ureteral stent. 28cm length .  Complications: None  Recommendations:  - Ok to shower tomorrow - Do not submerge until the stoma has closed with secondary intention. Routine dry dressing changes until stoma closed.  Some drainage is expected until closure - Advance diet - 1 hour recovery for sedation.  - follow up with urology for stent exchange  Signed,  Yvone Neu. Loreta Ave, DO

## 2023-03-01 ENCOUNTER — Ambulatory Visit (INDEPENDENT_AMBULATORY_CARE_PROVIDER_SITE_OTHER): Payer: 59 | Admitting: Family Medicine

## 2023-03-01 ENCOUNTER — Other Ambulatory Visit: Payer: Self-pay | Admitting: Radiology

## 2023-03-01 ENCOUNTER — Telehealth: Payer: Self-pay

## 2023-03-01 ENCOUNTER — Encounter: Payer: Self-pay | Admitting: Family Medicine

## 2023-03-01 VITALS — BP 150/80 | HR 60 | Temp 97.5°F | Ht 64.0 in | Wt 138.7 lb

## 2023-03-01 DIAGNOSIS — I10 Essential (primary) hypertension: Secondary | ICD-10-CM

## 2023-03-01 DIAGNOSIS — N3946 Mixed incontinence: Secondary | ICD-10-CM | POA: Diagnosis not present

## 2023-03-01 DIAGNOSIS — R2241 Localized swelling, mass and lump, right lower limb: Secondary | ICD-10-CM

## 2023-03-01 MED ORDER — OXYBUTYNIN CHLORIDE ER 10 MG PO TB24
10.0000 mg | ORAL_TABLET | Freq: Every day | ORAL | 1 refills | Status: DC
Start: 2023-03-01 — End: 2023-06-04

## 2023-03-01 MED ORDER — AMLODIPINE BESYLATE 2.5 MG PO TABS: 2.5000 mg | ORAL_TABLET | Freq: Every day | ORAL | 1 refills | Status: DC

## 2023-03-01 NOTE — Assessment & Plan Note (Signed)
BP is elevated today, I advised we start amlodipine 2.5 mg once daily, she should continue to check her BP at home daily to look for any signs of low blood pressure.

## 2023-03-01 NOTE — Assessment & Plan Note (Signed)
Pt requesting refills of her oxybutinin, states that she is peeing a lot due to the furosemide. Will refill this for her.

## 2023-03-01 NOTE — Progress Notes (Signed)
Established Patient Office Visit  Subjective   Patient ID: Jaime Gomez, female    DOB: 1966-02-04  Age: 57 y.o. MRN: [id number]  Chief Complaint  Patient presents with   Medical Management of Chronic Issues    Patient is here for follow up today. States she had her nephrostomy tubes removed and she is urinating well, last Cr was 2.76 which is slightly better than previous.   Patient is taking the venlafaxine 37.5 mg daily. States that it is helping her to feel better, however her blood pressure has been elevated since she fell. Denies any side effects to the medication, states it is working very well for her. BP has been elevated in past visits as well. Patient reports she has never had a problem with her BP in past. Pt is also on decadron 4 mg daily for her cancer pain. We discussed that it could be her medication that is causing the elevated pressures, or her CKD stage 4.   I reviewed her specialists notes, the ED notes, and I have reviewed her medication list.     Current Outpatient Medications  Medication Instructions   acetaminophen (TYLENOL) 500 mg, Oral, Every 6 hours PRN   amLODipine (NORVASC) 2.5 mg, Oral, Daily   carvedilol (COREG) 12.5 MG tablet TAKE 1 TABLET (12.5MG  TOTAL) BY MOUTH TWICE A DAY WITH MEALS   Continuous Glucose Sensor (FREESTYLE LIBRE 3 SENSOR) MISC 1 Act, Does not apply, Daily, Place 1 sensor on the skin every 14 days. Use to check glucose continuously   dexamethasone (DECADRON) 4 mg, Oral, Daily   enoxaparin (LOVENOX) 60 mg, Subcutaneous, Every 24 hours   famotidine (PEPCID) 20 mg, Oral, 2 times daily PRN   fondaparinux (ARIXTRA) 7.5 mg, Subcutaneous, Every 24 hours   furosemide (LASIX) 20 mg, Oral, Daily   gabapentin (NEURONTIN) 300 mg, Oral, 3 times daily   insulin detemir (LEVEMIR) 20 Units, Subcutaneous, 2 times daily   oxybutynin (DITROPAN-XL) 10 mg, Oral, Daily   oxycodone (OXY-IR) 5 mg, Oral, Every 4 hours PRN   polyethylene glycol (MIRALAX /  GLYCOLAX) 17 g, Oral, Daily PRN   polyethylene glycol (MIRALAX / GLYCOLAX) 17 g, Oral, 2 times daily   prochlorperazine (COMPAZINE) 10 mg, Oral, Every 6 hours PRN   sodium bicarbonate 650 mg, Oral, 3 times daily   venlafaxine XR (EFFEXOR XR) 37.5 mg, Oral, Daily with breakfast    Patient Active Problem List   Diagnosis Date Noted   Mixed stress and urge urinary incontinence 03/01/2023   Constipation 02/09/2023   Cancer associated pain 02/09/2023   High risk medication use 02/09/2023   DNR (do not resuscitate) 02/09/2023   Need for emotional support 02/09/2023   AKI (acute kidney injury) (HCC) 02/09/2023   Counseling and coordination of care 02/09/2023   Medication management 02/09/2023   Palliative care encounter 02/09/2023   Pulmonary embolism (HCC) 02/09/2023   Left lateral abdominal pain 02/09/2023   Pulmonary emboli (HCC) 02/08/2023   Abnormal urinalysis 02/08/2023   Leg mass 02/08/2023   CKD (chronic kidney disease) stage 4, GFR 15-29 ml/min (HCC) 12/27/2022   Depression, major, single episode, moderate (HCC) 12/02/2022   Sepsis secondary to UTI (HCC) 11/28/2022   Sepsis due to gram-negative UTI (HCC) 11/28/2022   Abnormality of lung on CXR 11/28/2022   Metabolic acidosis with normal anion gap and failure of bicarbonate regeneration 11/28/2022   IDA (iron deficiency anemia) 04/28/2022   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   Hyperkalemia 09/02/2021   Acute renal failure superimposed  on stage 3a chronic kidney disease (HCC) 09/01/2021   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) 09/01/2021   Anemia due to antineoplastic chemotherapy (CODE) 09/01/2021   Chronic kidney disease, stage 3a (HCC) 09/01/2021   Primary hypertension 09/01/2021   Gastroparesis 09/01/2021   Personal history of other venous thrombosis and embolism 09/01/2021   Personal history of pulmonary embolism 09/01/2021   Type 2 diabetes mellitus without complications (HCC) 09/01/2021    Endometrial cancer, FIGO stage IVB (HCC) 08/18/2021   Goals of care, counseling/discussion 08/18/2021   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 failure 08/18/2021   Leg DVT (deep venous thromboembolism), chronic, right (HCC) 08/18/2021      Review of Systems  All other systems reviewed and are negative.     Objective:     BP (!) 150/80 (BP Location: Left Arm, Patient Position: Sitting, Cuff Size: Normal)   Pulse 60   Temp (!) 97.5 F (36.4 C) (Oral)   Ht 5\' 4"  (1.626 m)   Wt 138 lb 11.2 oz (62.9 kg)   SpO2 98%   BMI 23.81 kg/m    Physical Exam Vitals reviewed.  Constitutional:      Appearance: Normal appearance. She is well-groomed and normal weight.  Cardiovascular:     Rate and Rhythm: Normal rate and regular rhythm.     Pulses: Normal pulses.     Heart sounds: S1 normal and S2 normal.  Pulmonary:     Effort: Pulmonary effort is normal.     Breath sounds: Normal breath sounds and air entry.  Musculoskeletal:     Right lower leg: No edema.     Left lower leg: No edema.  Neurological:     Mental Status: She is alert and oriented to person, place, and time. Mental status is at baseline.     Gait: Gait is intact.  Psychiatric:        Mood and Affect: Mood and affect normal.        Speech: Speech normal.        Behavior: Behavior normal.        Judgment: Judgment normal.      No results found for any visits on 03/01/23.  Last metabolic panel Lab Results  Component Value Date   GLUCOSE 73 02/28/2023   NA 136 02/28/2023   K 4.5 02/28/2023   CL 109 02/28/2023   CO2 17 (L) 02/28/2023   BUN 128 (H) 02/28/2023   CREATININE 2.76 (H) 02/28/2023   GFRNONAA 19 (L) 02/28/2023   CALCIUM 8.9 02/28/2023   PHOS 4.6 02/15/2023   PROT 7.6 02/15/2023   ALBUMIN 3.9 02/15/2023   BILITOT 0.4 02/15/2023   ALKPHOS 77 02/15/2023   AST 6 (L) 02/15/2023   ALT 5 02/15/2023   ANIONGAP 10 02/28/2023      The 10-year ASCVD risk score (Arnett DK, et al., 2019) is: 12.6%    Assessment  & Plan:  Primary hypertension Assessment & Plan: BP is elevated today, I advised we start amlodipine 2.5 mg once daily, she should continue to check her BP at home daily to look for any signs of low blood pressure.   Orders: -     amLODIPine Besylate; Take 1 tablet (2.5 mg total) by mouth daily.  Dispense: 90 tablet; Refill: 1  Mixed stress and urge urinary incontinence Assessment & Plan: Pt requesting refills of her oxybutinin, states that she is peeing a lot due to the furosemide. Will refill this for her.  Orders: -  oxyBUTYnin Chloride ER; Take 1 tablet (10 mg total) by mouth daily.  Dispense: 90 tablet; Refill: 1     Return in about 3 months (around 06/01/2023) for HTN and follow up.    Karie Georges, MD

## 2023-03-01 NOTE — Telephone Encounter (Signed)
-----   Message from Corrin Parker sent at 02/28/2023 12:34 PM EDT ----- Burley Saver,  This is another home health order for this patient. I am not sure why we are getting these. They need to go to her PCP or her Oncologist.   Thanks so much! Callie ----- Message ----- From: Royann Shivers Sent: 02/28/2023   9:45 AM EDT To: Alyson Ingles, LPN; #

## 2023-03-01 NOTE — Telephone Encounter (Signed)
Called and spoke with [facility] in his triage department. She will change and have this sent to them for signature (Pickenpack-Cousar, Arty Baumgartner, NP/Hospice and Palliative Medicine)

## 2023-03-02 ENCOUNTER — Ambulatory Visit (HOSPITAL_COMMUNITY)
Admission: RE | Admit: 2023-03-02 | Discharge: 2023-03-02 | Disposition: A | Discharge status: Home / Self Care | Payer: 59 | Source: Ambulatory Visit | Attending: Hematology & Oncology | Admitting: Hematology & Oncology

## 2023-03-02 ENCOUNTER — Encounter (HOSPITAL_COMMUNITY): Payer: Self-pay

## 2023-03-02 DIAGNOSIS — R2240 Localized swelling, mass and lump, unspecified lower limb: Secondary | ICD-10-CM | POA: Diagnosis not present

## 2023-03-02 DIAGNOSIS — Z66 Do not resuscitate: Secondary | ICD-10-CM | POA: Insufficient documentation

## 2023-03-02 DIAGNOSIS — R2241 Localized swelling, mass and lump, right lower limb: Secondary | ICD-10-CM | POA: Insufficient documentation

## 2023-03-02 DIAGNOSIS — C541 Malignant neoplasm of endometrium: Secondary | ICD-10-CM | POA: Insufficient documentation

## 2023-03-02 DIAGNOSIS — M533 Sacrococcygeal disorders, not elsewhere classified: Secondary | ICD-10-CM | POA: Insufficient documentation

## 2023-03-02 DIAGNOSIS — Z8542 Personal history of malignant neoplasm of other parts of uterus: Secondary | ICD-10-CM | POA: Insufficient documentation

## 2023-03-02 DIAGNOSIS — C7989 Secondary malignant neoplasm of other specified sites: Secondary | ICD-10-CM | POA: Diagnosis not present

## 2023-03-02 LAB — CBC
HCT: 33.8 % — ABNORMAL LOW (ref 36.0–46.0)
Hemoglobin: 10.8 g/dL — ABNORMAL LOW (ref 12.0–15.0)
MCH: 30.7 pg (ref 26.0–34.0)
MCHC: 32 g/dL (ref 30.0–36.0)
MCV: 96 fL (ref 80.0–100.0)
Platelets: 187 10*3/uL (ref 150–400)
RBC: 3.52 MIL/uL — ABNORMAL LOW (ref 3.87–5.11)
RDW: 14.4 % (ref 11.5–15.5)
WBC: 13.2 10*3/uL — ABNORMAL HIGH (ref 4.0–10.5)
nRBC: 0 % (ref 0.0–0.2)

## 2023-03-02 LAB — GLUCOSE, CAPILLARY: Glucose-Capillary: 193 mg/dL — ABNORMAL HIGH (ref 70–99)

## 2023-03-02 LAB — PROTIME-INR
INR: 1.1 (ref 0.8–1.2)
Prothrombin Time: 14.9 seconds (ref 11.4–15.2)

## 2023-03-02 MED ORDER — FENTANYL CITRATE (PF) 100 MCG/2ML IJ SOLN
INTRAMUSCULAR | Status: AC
  Filled 2023-03-02: qty 2

## 2023-03-02 MED ORDER — LIDOCAINE HCL (PF) 1 % IJ SOLN
5.0000 mL | Freq: Once | INTRAMUSCULAR | Status: AC
  Administered 2023-03-02: 5 mL via INTRADERMAL

## 2023-03-02 MED ORDER — SODIUM CHLORIDE 0.9 % IV SOLN: INTRAVENOUS | Status: DC

## 2023-03-02 MED ORDER — MIDAZOLAM HCL 2 MG/2ML IJ SOLN
INTRAMUSCULAR | Status: AC | PRN
  Administered 2023-03-02: 1 mg via INTRAVENOUS

## 2023-03-02 MED ORDER — FENTANYL CITRATE (PF) 100 MCG/2ML IJ SOLN
INTRAMUSCULAR | Status: AC | PRN
  Administered 2023-03-02: 25 ug via INTRAVENOUS

## 2023-03-02 MED ORDER — MIDAZOLAM HCL 2 MG/2ML IJ SOLN
INTRAMUSCULAR | Status: AC
  Filled 2023-03-02: qty 2

## 2023-03-02 NOTE — Procedures (Signed)
Interventional Radiology Procedure Note  Procedure: US guided biopsy, right anterior lower leg soft tissue mass.   Complications: None EBL: None  Recommendations: - Bedrest 1 hours.   - Routine wound care - Follow up pathology - Advance diet   Signed,  Yvone Neu. Loreta Ave, DO, ABVM, RPVI

## 2023-03-02 NOTE — H&P (Signed)
Chief Complaint: Patient was seen in consultation today for Right lower leg mass biopsy at the request of Ennever,Peter R  Referring Physician(s): Ennever,Peter R  Supervising Physician: Richarda Overlie  Patient Status: [facility] - Out-pt  History of Present Illness: Jaime Gomez is a 57 y.o. female   FULL Code for this procedure  (DNR status in chart--- rescinded for this procedure)  Hx endometrial cancer 2023 Follows with Dr Myna Hidalgo Noticed Rt calf muscle lump May 2024 Was thought to be 'blood clot' and treated with Coumadin Could never get INR therapeutic--- changed to Lovenox Has been off lovenox x 1 week  Noted growth in leg mass 7/19 MR:  IMPRESSION: 1. Complex subperiosteal mass along the posteromedial aspect of the proximal right tibial metaphysis, corresponding with the mass seen on recent ultrasound. Contrast was not administered for this examination, although internal vascularity was noted on ultrasound, implying a solid lesion. There is resulting erosion of the adjacent tibial cortex with adjacent marrow edema and periosteal edema. Findings are suspicious for metastatic disease in this patient with a history of endometrial carcinoma. Differential includes infection with a subperiosteal abscess and adjacent tibial osteomyelitis. Consider tissue sampling. 2. No other soft tissue masses or fluid collections identified. 3. Nonspecific edema within the deep posterior compartment of the right lower leg. 4. Known DVT is not well demonstrated by this examination.  Now scheduled for biopsy of mass per Dr Myna Hidalgo request  Dr Myna Hidalgo note 02/15/23: She did have a PET scan before she was admitted.  The PET scan actually showed a "mixed response" to therapy.  We have not treated her for quite a while.  She had a left perihilar lung mass which was larger.  She had actually decreased and some right lung lesions.  She had new hypermetabolic lesions in the left scapula, T12 and left  ninth rib.  There is no evidence of metastatic disease in the abdomen or pelvis. She was having pain in the right lower leg.  We ultimately did an MRI which showed a mass that was next to the tibia.  This could be metastatic disease or I suppose this could be a primary sarcoma.  We are going to have to biopsy this.  She is agreeable to this.  I spoke with Dr. Roselind Messier of Radiation Oncology.  He is willing to radiate this     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 failure 08/18/2021   Diabetes mellitus without complication (HCC) type 2    Endometrial cancer, FIGO stage IVB (HCC) 08/18/2021   Goals of care, counseling/discussion 08/18/2021   History of kidney stones    Hypertension    Leg DVT (deep venous thromboembolism), chronic, right (HCC) 08/18/2021   Ovarian cancer (HCC) 2013   Pneumonia 12/03/2022   Wears glasses     Past Surgical History:  Procedure Laterality Date   ABDOMINAL HYSTERECTOMY     BOTOX INJECTION N/A 01/12/2023   Procedure: BOTOX INJECTION INTO BLADDER;  Surgeon: Jannifer Hick, MD;  Location: [facility];  Service: Urology;  Laterality: N/A;   CYSTOSCOPY W/ RETROGRADES Right 01/12/2023   Procedure: CYSTOSCOPY WITH RIGHT  RETROGRADE PYELOGRAM/ RIGHT URETERAL STENT CHANGE;  Surgeon: Jannifer Hick, MD;  Location: [facility];  Service: Urology;  Laterality: Right;   CYSTOSCOPY W/ URETERAL STENT PLACEMENT Bilateral 09/01/2021   Procedure: CYSTOSCOPY WITH RETROGRADE PYELOGRAM/URETERAL STENT PLACEMENT;  Surgeon:  Jannifer Hick, MD;  Location: [facility];  Service: Urology;  Laterality: Bilateral;   CYSTOSCOPY WITH FULGERATION N/A 01/12/2023   Procedure: CYSTOSCOPY WITH FULGERATION;  Surgeon: Jannifer Hick, MD;  Location: [facility];  Service: Urology;  Laterality: N/A;   CYSTOSCOPY WITH STENT PLACEMENT Right 05/19/2022   Procedure: CYSTOSCOPY WITH STENT  CHANGE;  Surgeon: Jannifer Hick, MD;  Location: [facility];  Service: Urology;  Laterality: Right;  ONLY NEEDS 30 MIN   HERNIA REPAIR     IR CONVERT LEFT NEPHROSTOMY TO NEPHROURETERAL CATH  11/02/2021   IR CONVERT LEFT NEPHROSTOMY TO NEPHROURETERAL CATH  01/09/2023   IR CV LINE INJECTION  08/23/2021   IR DIL URETER LEFT  01/09/2023   IR IMAGING GUIDED PORT INSERTION  11/14/2021   IR IMAGING GUIDED PORT INSERTION  01/09/2023   IR NEPHROSTOMY EXCHANGE LEFT  11/02/2021   IR NEPHROSTOMY EXCHANGE LEFT  01/20/2022   IR NEPHROSTOMY EXCHANGE LEFT  11/29/2022   IR NEPHROSTOMY PLACEMENT LEFT  09/03/2021   IR REMOVAL TUN ACCESS W/ PORT W/O FL MOD SED  11/14/2021   IR REMOVAL TUN ACCESS W/ PORT W/O FL MOD SED  12/04/2022   IR URETERAL STENT PLACEMENT EXISTING ACCESS LEFT  02/28/2023    Allergies: Paclitaxel and Taxotere [docetaxel]  Medications: Prior to Admission medications   Medication Sig Start Date End Date Taking? Authorizing Provider  acetaminophen (TYLENOL) 500 MG tablet Take 500 mg by mouth every 6 (six) hours as needed for mild pain.   Yes [provider]  carvedilol (COREG) 12.5 MG tablet TAKE 1 TABLET (12.5MG  TOTAL) BY MOUTH TWICE A DAY WITH MEALS 02/19/23  Yes Ennever, Rose Phi, MD  Continuous Glucose Sensor (FREESTYLE LIBRE 3 SENSOR) MISC 1 Act by Does not apply route daily. Place 1 sensor on the skin every 14 days. Use to check glucose continuously 12/26/22  Yes Karie Georges, MD  enoxaparin (LOVENOX) 60 MG/0.6ML injection Inject 0.6 mLs (60 mg total) into the skin daily. 02/10/23 05/11/23 Yes Standley Brooking, MD  famotidine (PEPCID) 20 MG tablet Take 20 mg by mouth 2 (two) times daily as needed for heartburn or indigestion.   Yes [provider]  fondaparinux (ARIXTRA) 7.5 MG/0.6ML SOLN injection INJECT 0.6 MLS (7.5 MG TOTAL) INTO THE SKIN DAILY. 02/19/23  Yes Ennever, Rose Phi, MD  furosemide (LASIX) 20 MG tablet TAKE 1 TABLET BY MOUTH EVERY DAY Patient taking differently: Take  20 mg by mouth daily as needed for fluid or edema. 01/22/23  Yes Karie Georges, MD  gabapentin (NEURONTIN) 300 MG capsule Take 1 capsule (300 mg total) by mouth 3 (three) times daily. 02/14/23  Yes Ennever, Rose Phi, MD  insulin detemir (LEVEMIR) 100 UNIT/ML injection Inject 0.2 mLs (20 Units total) into the skin 2 (two) times daily. Patient taking differently: Inject 10-20 Units into the skin See admin instructions. Inject 10-20 units into the skin two times a day before meals, per sliding scale 08/30/22  Yes Ennever, Rose Phi, MD  oxybutynin (DITROPAN-XL) 10 MG 24 hr tablet Take 1 tablet (10 mg total) by mouth daily. 03/01/23  Yes Karie Georges, MD  oxycodone (OXY-IR) 5 MG capsule Take 1 capsule (5 mg total) by mouth every 4 (four) hours as needed for pain. 12/13/22  Yes Ennever, Rose Phi, MD  polyethylene glycol (MIRALAX / GLYCOLAX) 17 g packet Take 17 g by mouth daily as needed for mild constipation. 09/05/21  Yes Pokhrel, Rebekah Chesterfield, MD  polyethylene glycol (  MIRALAX / GLYCOLAX) 17 g packet Take 17 g by mouth 2 (two) times daily. 02/10/23  Yes Standley Brooking, MD  sodium bicarbonate 650 MG tablet Take 1 tablet (650 mg total) by mouth 3 (three) times daily. Patient taking differently: Take 650 mg by mouth 2 (two) times daily. 02/10/23  Yes Standley Brooking, MD  venlafaxine XR (EFFEXOR XR) 37.5 MG 24 hr capsule Take 1 capsule (37.5 mg total) by mouth daily with breakfast. 12/26/22  Yes Karie Georges, MD  amLODipine (NORVASC) 2.5 MG tablet Take 1 tablet (2.5 mg total) by mouth daily. 03/01/23   Karie Georges, MD  dexamethasone (DECADRON) 4 MG tablet Take 1 tablet (4 mg total) by mouth daily. 02/11/23   Standley Brooking, MD  prochlorperazine (COMPAZINE) 10 MG tablet Take 10 mg by mouth every 6 (six) hours as needed for nausea or vomiting. 08/18/21   [provider]     Family History  Problem Relation Age of Onset   Hyperlipidemia Mother    Heart disease Mother    Diabetes Mother     Depression Mother    COPD Mother    Arthritis Mother    Heart attack Mother    Drug abuse Father    Alcohol abuse Father    Hyperlipidemia Sister    Heart disease Sister    Diabetes Sister    Arthritis Sister    Miscarriages / [place] Sister    Depression Brother     Social History   Socioeconomic History   Marital status: Single    Spouse name: Not on file   Number of children: Not on file   Years of education: Not on file   Highest education level: Not on file  Occupational History   Not on file  Tobacco Use   Smoking status: Never   Smokeless tobacco: Never  Vaping Use   Vaping status: Never Used  Substance and Sexual Activity   Alcohol use: Never   Drug use: Never   Sexual activity: Not Currently  Other Topics Concern   Not on file  Social History Narrative   Not on file   Social Determinants of Health   Financial Resource Strain: Low Risk  (06/27/2022)   Overall Financial Resource Strain (CARDIA)    Difficulty of Paying Living Expenses: Not hard at all  Food Insecurity: No Food Insecurity (02/13/2023)   Hunger Vital Sign    Worried About Running Out of Food in the Last Year: Never true    Ran Out of Food in the Last Year: Never true  Transportation Needs: No Transportation Needs (02/13/2023)   PRAPARE - Administrator, Civil Service (Medical): No    Lack of Transportation (Non-Medical): No  Physical Activity: Not on file  Stress: No Stress Concern Present (06/27/2022)   [employer] of Occupational Health - Occupational Stress Questionnaire    Feeling of Stress : Not at all  Social Connections: Unknown (12/06/2021)   Received from [facility]   Social Network    Social Network: Not on file    Review of Systems: A 12 point ROS discussed and pertinent positives are indicated in the HPI above.  All other systems are negative.  Review of Systems  Constitutional:  Negative for activity change, fatigue and fever.  Respiratory:  Negative  for cough and shortness of breath.   Cardiovascular:  Negative for chest pain.  Gastrointestinal:  Negative for abdominal pain.  Musculoskeletal:  Positive for gait problem.  Negative for back pain.  Neurological:  Negative for weakness.  Psychiatric/Behavioral:  Negative for behavioral problems and confusion.     Vital Signs: Pulse 62   Temp (!) 97 F (36.1 C) (Temporal)   Resp 18   Ht 5\' 1"  (1.549 m)   Wt 138 lb (62.6 kg)   SpO2 91%   BMI 26.07 kg/m     Physical Exam Vitals reviewed.  HENT:     Mouth/Throat:     Mouth: Mucous membranes are moist.  Cardiovascular:     Rate and Rhythm: Normal rate and regular rhythm.     Heart sounds: Normal heart sounds.  Pulmonary:     Effort: Pulmonary effort is normal.     Breath sounds: Normal breath sounds.  Abdominal:     Palpations: Abdomen is soft.     Tenderness: There is no abdominal tenderness.  Musculoskeletal:        General: Normal range of motion.     Comments: Rt lower leg has 3x3 cm hard palpable mass near lateral side of leg  Skin:    General: Skin is warm.  Neurological:     Mental Status: She is alert and oriented to person, place, and time.  Psychiatric:        Behavior: Behavior normal.     Imaging: IR URETERAL STENT PLACEMENT EXISTING ACCESS LEFT  Result Date: 02/28/2023 INDICATION: 57 year old female presents for placement of a left ureteral stent via percutaneous access EXAM: IR URETURAL STENT PLACEMENT EXISTING ACCESS LEFT COMPARISON:  01/09/2023 MEDICATIONS: None. ANESTHESIA/SEDATION: Fentanyl 100 mcg IV; Versed 2.0 mg IV Moderate Sedation Time:  14 minutes The patient was continuously monitored during the procedure by the interventional radiology nurse under my direct supervision. CONTRAST:  10mL OMNIPAQUE IOHEXOL 300 MG/ML SOLN - administered into the collecting system(s) FLUOROSCOPY TIME:  Fluoroscopy Time:   (12 mGy). COMPLICATIONS: None PROCEDURE: Informed written consent was obtained from the patient  after a thorough discussion of the procedural risks, benefits and alternatives. All questions were addressed. Maximal Sterile Barrier Technique was utilized including caps, mask, sterile gowns, sterile gloves, sterile drape, hand hygiene and skin antiseptic. A timeout was performed prior to the initiation of the procedure. Patient was position prone under the image intensifier. Scout images were acquired of the abdomen and pelvis. The patient is prepped and draped in the usual sterile fashion including the indwelling left-sided percutaneous nephroureteral drain. The suture was ligated. Contrast was injected confirming location. 035 wire was then passed through the indwelling drain to straightening the drain and estimated internal measurement. Using a wire through the drain with a pull-back measurement technique, we estimated 28 cm internal length, which ultimately was slightly on the longer side than required. A 26 cm stent may be reasonable. Once the measure was performed, the 035 wire was placed again through the stent and the stent was removed. The cook 8.5 French 28 cm selected ureteral stent was then passed on the wire and deployed. All catheters and drains were removed. Final images were stored. Patient tolerated the procedure well and remained hemodynamically stable throughout. No complications were encountered and no significant blood loss. IMPRESSION: Status post image guided removal of left antegrade nephroureteral drain, with placement of internal left ureteral stent, 8.5 French, 28 cm. Signed, Yvone Neu. Miachel Roux, RPVI [facility] Radiology Electronically Signed   By: Gilmer Mor D.O.   On: 02/28/2023 14:01   CT HEAD WO CONTRAST ( )  Result Date: 02/20/2023 CLINICAL  DATA:  Fall, metastatic cervical cancer EXAM: CT HEAD WITHOUT CONTRAST CT CERVICAL SPINE WITHOUT CONTRAST TECHNIQUE: Multidetector CT imaging of the head and cervical spine was  performed following the standard protocol without intravenous contrast. Multiplanar CT image reconstructions of the cervical spine were also generated. RADIATION DOSE REDUCTION: This exam was performed according to the departmental dose-optimization program which includes automated exposure control, adjustment of the mA and/or kV according to patient size and/or use of iterative reconstruction technique. COMPARISON:  None Available. FINDINGS: CT HEAD FINDINGS Brain: No evidence of acute infarction, hemorrhage, hydrocephalus, extra-axial collection or mass lesion/mass effect. Periventricular white matter hypodensity. Unchanged encephalomalacia of the posterior left centrum semiovale (series 3, image 18). Vascular: No hyperdense vessel or unexpected calcification. Skull: Normal. Negative for fracture or focal lesion. Sinuses/Orbits: No acute finding. Other: None. CT CERVICAL SPINE FINDINGS Alignment: Normal. Skull base and vertebrae: No acute fracture. No primary bone lesion or focal pathologic process. Soft tissues and spinal canal: No prevertebral fluid or swelling. No visible canal hematoma. Disc levels: Mild-to-moderate multilevel disc degenerative disease, worst at C6-C7. Upper chest: Masslike perihilar consolidations of the lungs, incompletely imaged (series 11, image 12). Other: None. IMPRESSION: 1. No acute intracranial pathology. 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 2. No fracture or static subluxation of the cervical spine. 3. Mild-to-moderate multilevel disc degenerative disease, worst at C6-C7. 4. Masslike perihilar consolidations of the lungs, incompletely imaged. Please see recent dedicated imaging of the chest Electronically Signed   By: Jearld Lesch M.D.   On: 02/20/2023 11:20   CT Cervical Spine Wo Contrast  Result Date: 02/20/2023 CLINICAL DATA:  Fall, metastatic cervical cancer EXAM: CT HEAD WITHOUT CONTRAST CT CERVICAL SPINE  WITHOUT CONTRAST TECHNIQUE: Multidetector CT imaging of the head and cervical spine was performed following the standard protocol without intravenous contrast. Multiplanar CT image reconstructions of the cervical spine were also generated. RADIATION DOSE REDUCTION: This exam was performed according to the departmental dose-optimization program which includes automated exposure control, adjustment of the mA and/or kV according to patient size and/or use of iterative reconstruction technique. COMPARISON:  None Available. FINDINGS: CT HEAD FINDINGS Brain: No evidence of acute infarction, hemorrhage, hydrocephalus, extra-axial collection or mass lesion/mass effect. Periventricular white matter hypodensity. Unchanged encephalomalacia of the posterior left centrum semiovale (series 3, image 18). Vascular: No hyperdense vessel or unexpected calcification. Skull: Normal. Negative for fracture or focal lesion. Sinuses/Orbits: No acute finding. Other: None. CT CERVICAL SPINE FINDINGS Alignment: Normal. Skull base and vertebrae: No acute fracture. No primary bone lesion or focal pathologic process. Soft tissues and spinal canal: No prevertebral fluid or swelling. No visible canal hematoma. Disc levels: Mild-to-moderate multilevel disc degenerative disease, worst at C6-C7. Upper chest: Masslike perihilar consolidations of the lungs, incompletely imaged (series 11, image 12). Other: None. IMPRESSION: 1. No acute intracranial pathology. 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 2. No fracture or static subluxation of the cervical spine. 3. Mild-to-moderate multilevel disc degenerative disease, worst at C6-C7. 4. Masslike perihilar consolidations of the lungs, incompletely imaged. Please see recent dedicated imaging of the chest Electronically Signed   By: Jearld Lesch M.D.   On: 02/20/2023 11:20   DG Chest 1 View  Result Date: 02/20/2023 CLINICAL DATA:  Pain  EXAM: CHEST  1 VIEW COMPARISON:  12/03/2022 FINDINGS: Underinflation. No pneumothorax or effusion. Normal cardiopericardial silhouette. Overlapping cardiac leads. Right IJ line in place with tip along the central SVC. Increasing perihilar  opacities is identified with some nodularity. Interstitial changes as well. IMPRESSION: Underinflation. Increasing perihilar masslike areas. Please correlate with prior workup. Chest port. Electronically Signed   By: Karen Kays M.D.   On: 02/20/2023 10:42   DG Pelvis 1-2 Views  Result Date: 02/20/2023 CLINICAL DATA:  Pain after fall EXAM: PELVIS - 1 VIEW COMPARISON:  None Available. FINDINGS: No fracture or dislocation. Hyperostosis. Degenerative changes seen of the left sacroiliac joint and right hip. Diffuse vascular calcifications identified. Bilateral indwelling ureteral stents are seen extending off the edge of the imaging field. Mesh identified. IMPRESSION: Degenerative changes.  Ureteral stents. Electronically Signed   By: Karen Kays M.D.   On: 02/20/2023 10:40   ECHOCARDIOGRAM LIMITED  Result Date: 02/09/2023    ECHOCARDIOGRAM LIMITED REPORT   Patient Name:   DAIZA PRITZL Date of Exam: 02/09/2023 Medical Rec #:  [id number]    Height:       64.0 in Accession #:    [id number]   Weight:       135.0 lb Date of Birth:  Apr 13, 1966    BSA:          1.655 m Patient Age:    56 years     BP:           154/73 mmHg Patient Gender: F            HR:           69 bpm. Exam Location:  Inpatient Procedure: Limited Echo, Limited Color Doppler and Cardiac Doppler Indications:    Cardiomyopathy - unspecified  History:        Patient has prior history of Echocardiogram examinations, most                 recent 12/04/2022. CKD, cancer; Risk Factors:Hypertension, hx PE                 and Diabetes.  Sonographer:    Wallie Char Referring Phys: [id number] PETER R ENNEVER IMPRESSIONS  1. Left ventricular ejection fraction, by estimation, is 55 to 60%. The left ventricle has normal function.  The left ventricle has no regional wall motion abnormalities. Left ventricular diastolic parameters are indeterminate.  2. Right ventricular systolic function is normal. The right ventricular size is normal.  3. The mitral valve is normal in structure. Trivial mitral valve regurgitation. No evidence of mitral stenosis.  4. The aortic valve is normal in structure. Aortic valve regurgitation is not visualized. No aortic stenosis is present.  5. The inferior vena cava is normal in size with greater than 50% respiratory variability, suggesting right atrial pressure of 3 mmHg. Conclusion(s)/Recommendation(s): LVEF has normalized since previous study. FINDINGS  Left Ventricle: Left ventricular ejection fraction, by estimation, is 55 to 60%. The left ventricle has normal function. The left ventricle has no regional wall motion abnormalities. The left ventricular internal cavity size was normal in size. There is  no left ventricular hypertrophy. Left ventricular diastolic parameters are indeterminate. Right Ventricle: The right ventricular size is normal. No increase in right ventricular wall thickness. Right ventricular systolic function is normal. Left Atrium: Left atrial size was normal in size. Right Atrium: Right atrial size was normal in size. Pericardium: There is no evidence of pericardial effusion. Mitral Valve: The mitral valve is normal in structure. Trivial mitral valve regurgitation. No evidence of mitral valve stenosis. MV peak gradient, 4.4 mmHg. The mean mitral valve gradient is 2.0 mmHg. Tricuspid Valve: The tricuspid valve is normal in structure. Tricuspid  valve regurgitation is not demonstrated. No evidence of tricuspid stenosis. Aortic Valve: The aortic valve is normal in structure. Aortic valve regurgitation is not visualized. No aortic stenosis is present. Aortic valve mean gradient measures 4.0 mmHg. Aortic valve peak gradient measures 7.6 mmHg. Aortic valve area, by VTI measures 2.86 cm. Pulmonic  Valve: The pulmonic valve was normal in structure. Pulmonic valve regurgitation is not visualized. No evidence of pulmonic stenosis. Aorta: The aortic root is normal in size and structure. Venous: The inferior vena cava is normal in size with greater than 50% respiratory variability, suggesting right atrial pressure of 3 mmHg. IAS/Shunts: No atrial level shunt detected by color flow Doppler. Additional Comments: Spectral Doppler performed. Color Doppler performed.  LEFT VENTRICLE PLAX 2D LVIDd:         4.90 cm LVIDs:         3.40 cm LV PW:         0.90 cm LV IVS:        0.80 cm LVOT diam:     2.10 cm LV SV:         85 LV SV Index:   51 LVOT Area:     3.46 cm  LV Volumes (MOD) LV vol d, MOD A2C: 83.2 ml LV vol d, MOD A4C: 110.0 ml LV vol s, MOD A2C: 37.6 ml LV vol s, MOD A4C: 45.5 ml LV SV MOD A2C:     45.6 ml LV SV MOD A4C:     110.0 ml LV SV MOD BP:      53.6 ml RIGHT VENTRICLE          IVC RV Basal diam:  2.60 cm  IVC diam: 1.90 cm TAPSE (M-mode): 2.0 cm LEFT ATRIUM             Index        RIGHT ATRIUM           Index LA diam:        3.10 cm 1.87 cm/m   RA Area:     10.70 cm LA Vol (A2C):   34.0 ml 20.54 ml/m  RA Volume:   19.40 ml  11.72 ml/m LA Vol (A4C):   40.1 ml 24.22 ml/m LA Biplane Vol: 38.2 ml 23.08 ml/m  AORTIC VALVE AV Area (Vmax):    2.91 cm AV Area (Vmean):   2.73 cm AV Area (VTI):     2.86 cm AV Vmax:           138.00 cm/s AV Vmean:          101.000 cm/s AV VTI:            0.295 m AV Peak Grad:      7.6 mmHg AV Mean Grad:      4.0 mmHg LVOT Vmax:         116.00 cm/s LVOT Vmean:        79.600 cm/s LVOT VTI:          0.244 m LVOT/AV VTI ratio: 0.83  AORTA Ao Root diam: 3.40 cm MITRAL VALVE                TRICUSPID VALVE MV Area (PHT): 3.63 cm     TV Peak grad:   37.2 mmHg MV Area VTI:   3.40 cm     TV Vmax:        3.05 m/s MV Peak grad:  4.4 mmHg MV Mean grad:  2.0 mmHg     SHUNTS MV  Vmax:       1.05 m/s     Systemic VTI:  0.24 m MV Vmean:      58.9 cm/s    Systemic Diam: 2.10 cm MV Decel  Time: 209 msec MV E velocity: 90.60 cm/s MV A velocity: 104.00 cm/s MV E/A ratio:  0.87 Arvilla Meres MD Electronically signed by Arvilla Meres MD Signature Date/Time: 02/09/2023/3:41:58 PM    Final    NM PET Image Restag (PS) Skull Base To Thigh  Result Date: 02/09/2023 CLINICAL DATA:  Subsequent treatment strategy for endometrial/cervical cancer. EXAM: NUCLEAR MEDICINE PET SKULL BASE TO THIGH TECHNIQUE: 6.84 mCi F-18 FDG was injected intravenously. Full-ring PET imaging was performed from the skull base to thigh after the radiotracer. CT data was obtained and used for attenuation correction and anatomic localization. Fasting blood glucose: 123 mg/dl COMPARISON:  PET-CT 16/04/9603. Subsequent CTs of the chest, abdomen and pelvis 02/08/2023. FINDINGS: Mediastinal blood pool activity: SUV max 2.8 NECK: No hypermetabolic cervical lymph nodes are identified. No suspicious activity identified within the pharyngeal mucosal space. Incidental CT findings: Bilateral carotid atherosclerosis. CHEST: There are no hypermetabolic mediastinal, hilar or axillary lymph nodes. Enlarging left perihilar lung mass is intensely hypermetabolic with an SUV max of 11.3 (previously 10.4). This measures 5.5 x 4.0 cm on image 67/4 compared with 3.8 x 2.9 cm on previous PET-CT. The central component of a right lower lobe lesion also appears enlarged, measuring approximately 3.6 x 2.8 cm on image 65/4. This is less well-defined, possibly due to surrounding treatment changes. This mass remains hypermetabolic with an SUV max of 8.4 (previously 11.6). The right upper lobe component appears improved and with less metabolic activity (SUV max 8.4, previously 11.2). Incidental CT findings: Right IJ Port-A-Cath extends to the superior cavoatrial junction. Aortic and coronary artery atherosclerosis noted. No significant pericardial effusion. ABDOMEN/PELVIS: There is no hypermetabolic activity within the liver, adrenal glands, spleen or  pancreas. There is no hypermetabolic nodal activity in the abdomen or pelvis. Incidental CT findings: Percutaneous left nephroureteral tube and right double-J ureteral stent are unchanged in position. No hydronephrosis. Gallstone, IVC filter, postsurgical changes in the anterior abdominal wall and aortoiliac atherosclerosis are noted. SKELETON: There are several new hypermetabolic osseous lesions, suspicious for metastatic disease. There is a lesion involving the medial aspect of the left scapula which has an SUV max of 6.6. Lesion involving the posterior elements at T12 on the left has an SUV max of 9.5. There is a hypermetabolic lesion laterally within the left 9th rib. Activity at the left glenohumeral joint is similar to the previous study and likely arthropathic. Incidental CT findings: none IMPRESSION: 1. Mixed response to interval therapy. The left perihilar lung mass is larger and more hypermetabolic than on the previous PET-CT. The dominant right lung lesions demonstrate interval decreased hypermetabolic activity. The central component of the right lower lobe lesion is less well-defined, possibly due to surrounding treatment changes. 2. 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 (Of note, a subsequently demonstrated mass in the right lower leg is not included on this examination. Given the presence of other osseous metastases, this may represent a metastasis. Please refer to separate MRI report of 02/09/2023.) 3. No evidence of metastatic disease within the abdomen or pelvis. 4. Stable position of the left nephroureteral tube and right double-J ureteral stent. No hydronephrosis. 5.  Aortic Atherosclerosis (ICD10-I70.0). Electronically Signed   By: Carey Bullocks M.D.   On: 02/09/2023  15:27   MR TIBIA FIBULA RIGHT WO CONTRAST  Result Date: 02/09/2023 CLINICAL DATA:  Soft tissue mass in the right lower leg on Doppler ultrasound. DVT  demonstrated at that time. History of endometrial/cervical cancer. EXAM: MRI OF LOWER RIGHT EXTREMITY WITHOUT CONTRAST TECHNIQUE: Multiplanar, multisequence MR imaging of the right lower leg was performed. No intravenous contrast was administered. COMPARISON:  Doppler ultrasound 02/08/2023.  PET-CT 02/07/2023. FINDINGS: Bones/Joint/Cartilage Both lower legs are included on the coronal images. Markers were placed around the patient's palpable concern anteromedially in the proximal 3rd of the right lower leg. Underlying complex soft tissue mass further described below, eroding the posteromedial aspect of the adjacent tibia. There is heterogeneous intramedullary T2 hyperintensity within the adjacent proximal shaft of the right tibia. There is periosteal edema without evidence of periosteal thickening. No other osseous lesions are identified. There is no evidence of acute fracture or dislocation. No significant right knee or ankle joint effusion or arthropathy. Ligaments No significant ligamentous abnormalities are identified at the right knee or ankle. Muscles and Tendons Nonspecific edema throughout the deep posterior compartment of the right lower leg. No focal muscular atrophy identified. No evidence of tendon tear or tenosynovitis. Soft tissues As above, complex subperiosteal mass along the posteromedial aspect of the proximal right tibial metaphysis, corresponding with the mass seen on recent ultrasound. This demonstrates heterogeneous T2 hyperintensity. On the T1 weighted images, the signal is fairly homogeneous with dependent intrinsic T1 shortening. Contrast was not administered for this examination, although internal vascularity was noted on ultrasound, implying a solid lesion. As described above, there is resulting erosion of the adjacent tibial cortex with adjacent marrow edema. No other soft tissue masses or fluid collections are identified. There is surrounding soft tissue edema which extends into the  anterior pretibial subcutaneous tissues. The lower legs are not included on the recent PET-CT. Known DVT is not well demonstrated by this examination. There is a small Baker's cyst. IMPRESSION: 1. Complex subperiosteal mass along the posteromedial aspect of the proximal right tibial metaphysis, corresponding with the mass seen on recent ultrasound. Contrast was not administered for this examination, although internal vascularity was noted on ultrasound, implying a solid lesion. There is resulting erosion of the adjacent tibial cortex with adjacent marrow edema and periosteal edema. Findings are suspicious for metastatic disease in this patient with a history of endometrial carcinoma. Differential includes infection with a subperiosteal abscess and adjacent tibial osteomyelitis. Consider tissue sampling. 2. No other soft tissue masses or fluid collections identified. 3. Nonspecific edema within the deep posterior compartment of the right lower leg. 4. Known DVT is not well demonstrated by this examination. Electronically Signed   By: Carey Bullocks M.D.   On: 02/09/2023 14:57   US Venous Img Lower Bilateral (DVT)  Result Date: 02/08/2023 CLINICAL DATA:  DVT (deep venous thrombosis) (HCC) . EXAM: BILATERAL LOWER EXTREMITY VENOUS DOPPLER ULTRASOUND TECHNIQUE: Gray-scale sonography with graded compression, as well as color Doppler and duplex ultrasound were performed to evaluate the lower extremity deep venous systems from the level of the common femoral vein and including the common femoral, femoral, profunda femoral, popliteal and calf veins including the posterior tibial, peroneal and gastrocnemius veins when visible. The superficial great saphenous vein was also interrogated. Spectral Doppler was utilized to evaluate flow at rest and with distal augmentation maneuvers in the common femoral, femoral and popliteal veins. COMPARISON:  None Available. FINDINGS: RIGHT LOWER EXTREMITY Common Femoral Vein: Positive  for thrombus. Saphenofemoral Junction: No evidence of thrombus. Normal  compressibility and flow on color Doppler imaging. Profunda Femoral Vein: Positive for thrombus. Femoral Vein: Positive for thrombus. Popliteal Vein: Positive for thrombus. Calf Veins: Limited visualization. Other: Heterogeneous 2.7 x 4.1 x 2.3 cm mass in the upper right calf with internal vascularity LEFT LOWER EXTREMITY Common Femoral Vein: No evidence of thrombus. Normal compressibility, respiratory phasicity and response to augmentation. Saphenofemoral Junction: No evidence of thrombus. Normal compressibility and flow on color Doppler imaging. Profunda Femoral Vein: No evidence of thrombus. Normal compressibility and flow on color Doppler imaging. Femoral Vein: Positive for thrombus. Popliteal Vein: Positive for thrombus. Calf Veins: Not well visualized. IMPRESSION: 1. Positive for DVT involving the right common femoral, profunda femoral, femoral and popliteal veins and the left femoral and popliteal veins. 2. Heterogeneous 2.7 x 4.1 x 2.3 cm mass in the upper right calf with internal vascularity. Recommend MRI to further evaluate. Electronically Signed   By: Feliberto Harts M.D.   On: 02/08/2023 14:21   CT Chest Wo Contrast  Result Date: 02/08/2023 CLINICAL DATA:  Partially visualized masslike consolidation; * Tracking Code: BO * EXAM: CT CHEST WITHOUT CONTRAST TECHNIQUE: Multidetector CT imaging of the chest was performed following the standard protocol without IV contrast. RADIATION DOSE REDUCTION: This exam was performed according to the departmental dose-optimization program which includes automated exposure control, adjustment of the mA and/or kV according to patient size and/or use of iterative reconstruction technique. COMPARISON:  Same day CT of the abdomen and pelvis; PET-CT dated August 21, 2022; PET-CT dated February 07, 2023; CT renal stone dated February 01, 2023 FINDINGS: Cardiovascular: Normal heart size. No pericardial  effusion. Normal caliber thoracic aorta with mild atherosclerotic disease. Severe coronary artery calcifications. Mediastinum/Nodes: Esophagus and thyroid are unremarkable. No enlarging lymph nodes seen in the chest. Reference right pre-vascular lymph node measuring 9 mm in short axis on series 2, image 59, unchanged. Lungs/Pleura: Bilateral pulmonary masses again seen. Left perihilar mass measures 4.7 x 4.6 cm on series 2, image 68, previously 3.8 x 2.9 cm on August 21, 2022 PET-CT. Associated increased occlusion of the left lower lobe anteromedial and lateral bronchopulmonary segments. Dominant right upper lobe mass is decreased in size, however there are new areas of peripheral nodularity. Measures 2.5 x 2.9 cm on series 2, image 39, previously 3.1 x 3.4 cm. New peripheral nodular component measuring 7 mm on series 3, image 33. Dominant right lower lobe pulmonary nodule is similar in size when compared with prior August 21, 2022 PET-CT, however there are new peripheral nodular components. Cystic lesion of the right lower lobe measuring up to 3.2 cm previously, 2.5 cm, with new peripheral nodular components, largest measures 9 mm on series 3, image 90. Nodular component are increased in size when lung bases seen on CT renal stone CT dated January 02, 2023. Additional new bilateral solid pulmonary nodules are seen. Reference solid nodule of the left lower lobe measuring 7 mm on series 3, image 76. Upper Abdomen: No acute abnormality. See same day separately dictated CT of the abdomen and pelvis for further discussion. Musculoskeletal: No chest wall mass or suspicious bone lesions identified. IMPRESSION: 1. Left perihilar masses is increased in size when compared with August 21, 2022 PET-CT, findings are concerning for progressive disease. See recently performed PET-CT dated February 07, 2023 for further discussion. 2. Cystic lesion of the right lower lobe with nodular components, nodules are increased size when  compared with portions of the lung visualized on January 02, 2023 abdomen and pelvis CT. 3. Additional  new bilateral solid pulmonary nodules are seen when compared with PET-CT dated August 21, 2022. 4. No enlarging lymph nodes seen in the chest. 5. Aortic Atherosclerosis (ICD10-I70.0). Electronically Signed   By: Allegra Lai M.D.   On: 02/08/2023 11:34   CT Renal Stone Study  Result Date: 02/08/2023 CLINICAL DATA:  History of endometrial cancer with 2 day history of left flank pain associated with nausea and vomiting. * Tracking Code: BO * EXAM: CT ABDOMEN AND PELVIS WITHOUT CONTRAST TECHNIQUE: Multidetector CT imaging of the abdomen and pelvis was performed following the standard protocol without IV contrast. RADIATION DOSE REDUCTION: This exam was performed according to the departmental dose-optimization program which includes automated exposure control, adjustment of the mA and/or kV according to patient size and/or use of iterative reconstruction technique. COMPARISON:  Nuclear medicine PET dated 02/07/2023, CT abdomen and pelvis dated 01/02/2023 FINDINGS: Lower chest: Partially imaged masslike consolidation involving the left hilum and right lower lobe are new from 01/02/2023. Medial right lower lobe nodule measuring 1.0 x 0.8 cm (4:10), previously 0.6 x 0.5 cm. Slightly inferiorly, a 6 mm subsolid nodule is new (4:15). Previously noted irregular/clustered nodule in the medial left lower lobe is no longer seen. No pleural effusion or pneumothorax demonstrated. Partially imaged heart size is normal. Coronary artery calcifications. Hepatobiliary: No focal hepatic lesions. No intra or extrahepatic biliary ductal dilation. Cholelithiasis. Pancreas: No focal lesions or main ductal dilation. Spleen: Normal in size without focal abnormality. Adrenals/Urinary Tract: No adrenal nodules. Bilateral ureteral stents and percutaneous left nephrostomy tube in-situ, similar to IR procedure dated 01/09/2023. Similar  right urothelial thickening and mild pelvic fullness. No suspicious renal mass, calculi or hydronephrosis. Urinary bladder is decompressed. Stomach/Bowel: Normal appearance of the stomach. No evidence of bowel wall thickening, distention, or inflammatory changes. Similar rectal distention containing large volume stool. Small to moderate volume stool within the remainder of the colon. Fecalization of material within the terminal ileum. Normal appendix. Vascular/Lymphatic: Aortic atherosclerosis. Infrarenal IVC filter in-situ. No enlarged abdominal or pelvic lymph nodes. Reproductive: No adnexal masses. Other: Small volume presacral edema. No free air or fluid collection. Musculoskeletal: No acute or abnormal lytic or blastic osseous lesions. Postsurgical changes of the anterior abdominal wall. IMPRESSION: 1. Partially imaged masslike consolidation involving the left hilum and right lower lobe are new from 01/02/2023 and may represent infection/inflammation or metastatic disease. 2. Medial right lower lobe nodule measuring 1.0 x 0.8 cm, previously 0.6 x 0.5 cm. Slightly inferiorly, a 6 mm subsolid nodule is new. Previously noted irregular/clustered nodule in the medial left lower lobe is no longer seen. 3. Bilateral ureteral stents and percutaneous left nephrostomy tube in-situ, similar to IR procedure dated 01/09/2023. Similar right urothelial thickening and mild pelvic fullness. 4. Similar rectal distention containing large volume stool. Small to moderate volume stool within the remainder of the colon. Fecalization of material within the terminal ileum likely reflecting slowed transit. 5. Aortic Atherosclerosis (ICD10-I70.0). Coronary artery calcifications. Assessment for potential risk factor modification, dietary therapy or pharmacologic therapy may be warranted, if clinically indicated. Electronically Signed   By: Agustin Cree M.D.   On: 02/08/2023 09:34    Labs:  CBC: Recent Labs    02/10/23 0401  02/15/23 1510 02/28/23 0809 03/02/23 1127  WBC 5.2 10.0 10.1 13.2*  HGB 7.6* 10.1* 10.5* 10.8*  HCT 23.7* 31.6* 33.7* 33.8*  PLT 170 196 170 187    COAGS: Recent Labs    11/28/22 0844 11/30/22 1204 12/09/22 0359 01/02/23 1142 01/09/23 0800 02/08/23  9562 02/15/23 1511 02/28/23 0809 03/02/23 1127  INR 1.2 1.1   < > 2.7*   < > 1.4* 1.2 1.1 1.1  APTT 49* 41*  --  61*  --   --   --   --   --    < > = values in this interval not displayed.    BMP: Recent Labs    02/09/23 0500 02/10/23 0401 02/15/23 1510 02/28/23 0809  NA 137 140 134* 136  K 4.2 5.0 5.5* 4.5  CL 116* 113* 101 109  CO2 15* 17* 20* 17*  GLUCOSE 56* 176* 384* 73  BUN 89* 83* 91* 128*  CALCIUM 8.4* 9.0 9.7 8.9  CREATININE 3.07* 2.83* 2.86* 2.76*  GFRNONAA 17* 19* 19* 19*    LIVER FUNCTION TESTS: Recent Labs    01/02/23 1142 02/01/23 1446 02/08/23 0922 02/09/23 0500 02/10/23 0401 02/15/23 1510  BILITOT 0.4 0.4 0.2*  --   --  0.4  AST 10* 9* 12*  --   --  6*  ALT 9 5 10   --   --  5  ALKPHOS 91 91 95  --   --  77  PROT 7.4 7.9 7.6  --   --  7.6  ALBUMIN 3.1* 3.8 3.1* 2.8* 2.8* 3.9    TUMOR MARKERS: No results for input(s): "AFPTM", "CEA", "CA199", "CHROMGRNA" in the last 8760 hours.  Assessment and Plan:  Scheduled for Right lower leg mass biopsy Risks and benefits of right lower leg mass biopsy was discussed with the patient and/or patient's family including, but not limited to bleeding, infection, damage to adjacent structures or low yield requiring additional tests.  All of the questions were answered and there is agreement to proceed.  Consent signed and in chart.  Thank you for this interesting consult.  I greatly enjoyed meeting Anays Dowler and look forward to participating in their care.  A copy of this report was sent to the requesting provider on this date.  Electronically Signed: Robet Leu, PA-C 03/02/2023, 12:05 PM   I spent a total of  30 Minutes   in face to face in  clinical consultation, greater than 50% of which was counseling/coordinating care for right lower leg mass biopsy

## 2023-03-05 ENCOUNTER — Ambulatory Visit
Admission: RE | Admit: 2023-03-05 | Discharge: 2023-03-05 | Disposition: A | Discharge status: Home / Self Care | Payer: 59 | Source: Ambulatory Visit | Attending: Radiation Oncology | Admitting: Radiation Oncology

## 2023-03-05 ENCOUNTER — Ambulatory Visit: Payer: 59

## 2023-03-05 ENCOUNTER — Telehealth: Payer: Self-pay | Admitting: Nurse Practitioner

## 2023-03-06 ENCOUNTER — Inpatient Hospital Stay: Payer: 59

## 2023-03-06 NOTE — Progress Notes (Signed)
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 Mixed response to interval therapy. The left perihilar lung mass is larger and more hypermetabolic than on the previous PET-CT. The dominant right lung lesions demonstrate interval decreased hypermetabolic activity. The central component of the right lower lobe lesion is less well-defined, possibly due to surrounding treatment changes. 2. 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 (Of note, a subsequently demonstrated mass in the right lower leg is not included on this examination. Given the presence of other osseous metastases, this may represent a metastasis. Please refer to separate MRI report of 02/09/2023.) 3. No evidence of metastatic disease within the abdomen or pelvis. 4. Stable position of the left nephroureteral tube and right double-J ureteral stent. No hydronephrosis. 5.  Aortic Atherosclerosis (ICD10-I70.0).  MR TIBIA FIBULA RIGHT WO CONTRAST 02/09/2023  IMPRESSION: 1. Complex subperiosteal mass along the posteromedial aspect of the proximal right tibial metaphysis, corresponding with the mass seen on recent ultrasound. Contrast was not administered for this examination, although internal vascularity was noted on ultrasound, implying a solid lesion. There is resulting erosion of the adjacent tibial cortex with adjacent marrow edema and periosteal edema. Findings are suspicious for metastatic disease in this patient with a history of endometrial carcinoma. Differential includes infection with a subperiosteal abscess and adjacent tibial osteomyelitis. Consider tissue sampling. 2. No other soft tissue masses or fluid collections identified. 3. Nonspecific edema within the deep posterior compartment of  the right lower leg. 4. Known DVT is not well demonstrated by this examination.  Past/Anticipated chemotherapy by medical oncology, if any:  [name], MD 02/15/2023 15:45  Impression and Plan: Mr. Reimann is a 57 yo caucasian female with metastatic endometrial cancer.  She has been on quite a few treatments.     I am just very impressed as to how good she looks.  We certainly can use systemic therapy on her.   I realize that there are quite a lot of things going on with her.  Again, I told her that the blood sugars really had to get under better control after going to think that chemotherapy will help.   I do think that a biopsy of this tibial mass would be helpful.  We have never been able to get a biopsy on her and send off for molecular studies.  I think this would certainly be reasonable.  She is in agreement.   We will go ahead with some radiation first for the right tibial mass.  This will hopefully help with any pain that she is having.   I am just happy that her quality of life is doing better.   We will plan to get her back probably in about 2 or 3 weeks.  By then, we should have results back from the biopsy and hopefully the molecular analysis.      Patient's main complaints related to symptomatic tumor(s) are:  She was having pain in the right lower leg. We ultimately did an MRI which showed a mass that was next to the tibia.   Pain on a scale of 0-10 is: pt keeps 5/10 pain in her right leg, she gets very little relief at this time  If Spine Met(s), symptoms, if any, include: Bowel/Bladder retention or incontinence (please describe): na Numbness or weakness in extremities (  please describe): na Current Decadron regimen, if applicable: na  Ambulatory status? Walker? Wheelchair?: pt uses cane  SAFETY ISSUES: Prior radiation? Yes, with Dr. Roselind Messier for endometrial cancer Pacemaker/ICD? No, just has port a cath Possible current pregnancy? no Is the patient on  methotrexate? No   Additional Complaints / other details:  Pt has no major concerns or questions. She is hoping this radiation will give her pain relief.

## 2023-03-08 ENCOUNTER — Encounter: Payer: Self-pay | Admitting: Family

## 2023-03-09 ENCOUNTER — Telehealth: Payer: Self-pay

## 2023-03-09 ENCOUNTER — Other Ambulatory Visit: Payer: Self-pay

## 2023-03-09 ENCOUNTER — Inpatient Hospital Stay: Payer: 59 | Attending: Hematology & Oncology

## 2023-03-09 ENCOUNTER — Encounter: Payer: Self-pay | Admitting: Radiation Oncology

## 2023-03-09 ENCOUNTER — Inpatient Hospital Stay (HOSPITAL_BASED_OUTPATIENT_CLINIC_OR_DEPARTMENT_OTHER): Payer: 59 | Admitting: Hematology & Oncology

## 2023-03-09 ENCOUNTER — Encounter: Payer: Self-pay | Admitting: Hematology & Oncology

## 2023-03-09 ENCOUNTER — Encounter: Payer: Self-pay | Admitting: *Deleted

## 2023-03-09 ENCOUNTER — Inpatient Hospital Stay: Payer: 59

## 2023-03-09 VITALS — BP 107/66 | HR 89 | Temp 98.2°F | Resp 18 | Ht 64.0 in | Wt 141.0 lb

## 2023-03-09 DIAGNOSIS — C541 Malignant neoplasm of endometrium: Secondary | ICD-10-CM | POA: Diagnosis not present

## 2023-03-09 DIAGNOSIS — C7951 Secondary malignant neoplasm of bone: Secondary | ICD-10-CM | POA: Diagnosis not present

## 2023-03-09 DIAGNOSIS — N170 Acute kidney failure with tubular necrosis: Secondary | ICD-10-CM

## 2023-03-09 DIAGNOSIS — R2241 Localized swelling, mass and lump, right lower limb: Secondary | ICD-10-CM

## 2023-03-09 DIAGNOSIS — N189 Chronic kidney disease, unspecified: Secondary | ICD-10-CM | POA: Insufficient documentation

## 2023-03-09 DIAGNOSIS — C530 Malignant neoplasm of endocervix: Secondary | ICD-10-CM

## 2023-03-09 DIAGNOSIS — I82501 Chronic embolism and thrombosis of unspecified deep veins of right lower extremity: Secondary | ICD-10-CM

## 2023-03-09 LAB — CMP (CANCER CENTER ONLY)
ALT: 10 U/L (ref 0–44)
AST: 9 U/L — ABNORMAL LOW (ref 15–41)
Albumin: 3.6 g/dL (ref 3.5–5.0)
Alkaline Phosphatase: 78 U/L (ref 38–126)
Anion gap: 15 (ref 5–15)
BUN: 120 mg/dL — ABNORMAL HIGH (ref 6–20)
CO2: 19 mmol/L — ABNORMAL LOW (ref 22–32)
Calcium: 8.5 mg/dL — ABNORMAL LOW (ref 8.9–10.3)
Chloride: 100 mmol/L (ref 98–111)
Creatinine: 3.34 mg/dL — ABNORMAL HIGH (ref 0.44–1.00)
GFR, Estimated: 15 mL/min — ABNORMAL LOW (ref >60)
Glucose, Bld: 127 mg/dL — ABNORMAL HIGH (ref 70–99)
Potassium: 4.2 mmol/L (ref 3.5–5.1)
Sodium: 134 mmol/L — ABNORMAL LOW (ref 135–145)
Total Bilirubin: 0.4 mg/dL (ref 0.3–1.2)
Total Protein: 6.6 g/dL (ref 6.5–8.1)

## 2023-03-09 LAB — SAMPLE TO BLOOD BANK

## 2023-03-09 LAB — CBC WITH DIFFERENTIAL (CANCER CENTER ONLY)
Abs Immature Granulocytes: 0.49 10*3/uL — ABNORMAL HIGH (ref 0.00–0.07)
Basophils Absolute: 0 10*3/uL (ref 0.0–0.1)
Basophils Relative: 0 %
Eosinophils Absolute: 0.4 10*3/uL (ref 0.0–0.5)
Eosinophils Relative: 4 %
HCT: 30.1 % — ABNORMAL LOW (ref 36.0–46.0)
Hemoglobin: 9.8 g/dL — ABNORMAL LOW (ref 12.0–15.0)
Immature Granulocytes: 4 %
Lymphocytes Relative: 10 %
Lymphs Abs: 1.1 10*3/uL (ref 0.7–4.0)
MCH: 30.7 pg (ref 26.0–34.0)
MCHC: 32.6 g/dL (ref 30.0–36.0)
MCV: 94.4 fL (ref 80.0–100.0)
Monocytes Absolute: 0.9 10*3/uL (ref 0.1–1.0)
Monocytes Relative: 8 %
Neutro Abs: 8.2 10*3/uL — ABNORMAL HIGH (ref 1.7–7.7)
Neutrophils Relative %: 74 %
Platelet Count: 176 10*3/uL (ref 150–400)
RBC: 3.19 MIL/uL — ABNORMAL LOW (ref 3.87–5.11)
RDW: 14.5 % (ref 11.5–15.5)
WBC Count: 11.1 10*3/uL — ABNORMAL HIGH (ref 4.0–10.5)
nRBC: 0 % (ref 0.0–0.2)

## 2023-03-09 LAB — RETICULOCYTES
Immature Retic Fract: 11.9 % (ref 2.3–15.9)
RBC.: 3.2 MIL/uL — ABNORMAL LOW (ref 3.87–5.11)
Retic Count, Absolute: 115.5 10*3/uL (ref 19.0–186.0)
Retic Ct Pct: 3.6 % — ABNORMAL HIGH (ref 0.4–3.1)

## 2023-03-09 LAB — PREALBUMIN: Prealbumin: 28 mg/dL (ref 18–38)

## 2023-03-09 LAB — PROTIME-INR
INR: 1.1 (ref 0.8–1.2)
Prothrombin Time: 14.6 seconds (ref 11.4–15.2)

## 2023-03-09 LAB — FERRITIN: Ferritin: 2438 ng/mL — ABNORMAL HIGH (ref 11–307)

## 2023-03-09 MED ORDER — LIDOCAINE 5 % EX PTCH: 1.0000 | MEDICATED_PATCH | CUTANEOUS | 0 refills | Status: DC

## 2023-03-09 NOTE — Progress Notes (Signed)
Hematology and Oncology Follow Up Visit  Jaime Gomez [id number] May 08, 1966 57 y.o. 03/09/2023   Principle Diagnosis:  Metastatic adenocarcinoma of the endometrium -- No Actionable mutations Chronic renal failure Recurrent thromboembolic disease   Current Therapy:        Afinitor/letrozole - DC'd Blood transfusion as needed Lovenox 60 mg SQ daily   Interim History:  Jaime Gomez is her today with her sister for follow-up.  She did have the biopsy on the right tibia lesion.  This was done on 03/02/2023.  The pathology report ([id number]) showed metastatic carcinoma consistent with a metastatic endometrioid carcinoma.  We will send this off for [facility] analysis.  She is having some pain down there.  I will see if a Lidoderm patch might help [place] out.  This might be reasonable to try for her.  She is already been contacted by Radiation Oncology for radiation.  She is on Lovenox.  I think she is doing well on the Lovenox for thromboembolic prevention.  She apparently had stent changes recently.  This was little bit uncomfortable for her.  She has had no cough.  She has had no nausea or vomiting.  She has had no obvious change in bowel or bladder habits.  Overall, I would have to say that her performance status right now is probably ECOG 1.  Thankfully, her blood sugar is doing much better.  Thankfully she has a Port-A-Cath in which will certainly help what we do systemic chemotherapy.    Of note, her last CA-125 was 457.    Medications:  Allergies as of 03/09/2023       Reactions   Paclitaxel Anaphylaxis, Swelling, Other (See Comments)   Made everything swell   Taxotere [docetaxel] Anaphylaxis, Swelling, Other (See Comments)   Throat swelling        Medication List        Accurate as of March 09, 2023  2:39 PM. If you have any questions, ask your nurse or doctor.          STOP taking these medications    fondaparinux 7.5 MG/0.6ML Soln injection Commonly  known as: ARIXTRA Stopped by: [name]       TAKE these medications    acetaminophen 500 MG tablet Commonly known as: TYLENOL Take 500 mg by mouth every 6 (six) hours as needed for mild pain.   amLODipine 2.5 MG tablet Commonly known as: NORVASC Take 1 tablet (2.5 mg total) by mouth daily.   carvedilol 12.5 MG tablet Commonly known as: COREG TAKE 1 TABLET (12.5MG  TOTAL) BY MOUTH TWICE A DAY WITH MEALS   dexamethasone 4 MG tablet Commonly known as: DECADRON Take 1 tablet (4 mg total) by mouth daily.   enoxaparin 60 MG/0.6ML injection Commonly known as: LOVENOX Inject 0.6 mLs (60 mg total) into the skin daily.   famotidine 20 MG tablet Commonly known as: PEPCID Take 20 mg by mouth 2 (two) times daily as needed for heartburn or indigestion.   FreeStyle Libre 3 Sensor Misc 1 Act by Does not apply route daily. Place 1 sensor on the skin every 14 days. Use to check glucose continuously   furosemide 20 MG tablet Commonly known as: LASIX TAKE 1 TABLET BY MOUTH EVERY DAY What changed:  when to take this reasons to take this   gabapentin 300 MG capsule Commonly known as: NEURONTIN Take 1 capsule (300 mg total) by mouth 3 (three) times daily.   insulin detemir 100 UNIT/ML injection Commonly known as:  LEVEMIR Inject 0.2 mLs (20 Units total) into the skin 2 (two) times daily. What changed:  how much to take when to take this additional instructions   oxybutynin 10 MG 24 hr tablet Commonly known as: DITROPAN-XL Take 1 tablet (10 mg total) by mouth daily.   oxycodone 5 MG capsule Commonly known as: OXY-IR Take 1 capsule (5 mg total) by mouth every 4 (four) hours as needed for pain.   polyethylene glycol 17 g packet Commonly known as: MIRALAX / GLYCOLAX Take 17 g by mouth daily as needed for mild constipation. What changed: Another medication with the same name was removed. Continue taking this medication, and follow the directions you see here. Changed by:  [name]   prochlorperazine 10 MG tablet Commonly known as: COMPAZINE Take 10 mg by mouth every 6 (six) hours as needed for nausea or vomiting.   sodium bicarbonate 650 MG tablet Take 1 tablet (650 mg total) by mouth 3 (three) times daily. What changed: when to take this   venlafaxine XR 37.5 MG 24 hr capsule Commonly known as: Effexor XR Take 1 capsule (37.5 mg total) by mouth daily with breakfast.        Allergies:  Allergies  Allergen Reactions   Paclitaxel Anaphylaxis, Swelling and Other (See Comments)    Made everything swell   Taxotere [Docetaxel] Anaphylaxis, Swelling and Other (See Comments)    Throat swelling    Past Medical History, Surgical history, Social history, and Family History were reviewed and updated.  Review of Systems: Review of Systems  Constitutional: Negative.   HENT: Negative.    Eyes: Negative.   Respiratory: Negative.    Cardiovascular: Negative.   Gastrointestinal: Negative.   Genitourinary:  Positive for flank pain.  Musculoskeletal:  Positive for back pain.  Skin: Negative.   Neurological: Negative.   Endo/Heme/Allergies: Negative.   Psychiatric/Behavioral: Negative.     Marland Kitchen   Physical Exam:  height is 5\' 4"  (1.626 m) and weight is 141 lb (64 kg). Her oral temperature is 98.2 F (36.8 C). Her blood pressure is 107/66 and her pulse is 89. Her respiration is 18 and oxygen saturation is 100%.   Wt Readings from Last 3 Encounters:  03/09/23 141 lb (64 kg)  03/02/23 138 lb (62.6 kg)  03/01/23 138 lb 11.2 oz (62.9 kg)    Physical Exam Vitals reviewed.  HENT:     Head: Normocephalic and atraumatic.  Eyes:     Pupils: Pupils are equal, round, and reactive to light.  Cardiovascular:     Rate and Rhythm: Normal rate and regular rhythm.     Heart sounds: Normal heart sounds.  Pulmonary:     Effort: Pulmonary effort is normal.     Breath sounds: Normal breath sounds.  Abdominal:     General: Bowel sounds are normal.      Palpations: Abdomen is soft.  Musculoskeletal:        General: No tenderness or deformity. Normal range of motion.     Cervical back: Normal range of motion.  Lymphadenopathy:     Cervical: No cervical adenopathy.  Skin:    General: Skin is warm and dry.     Findings: No erythema or rash.  Neurological:     Mental Status: She is alert and oriented to person, place, and time.  Psychiatric:        Behavior: Behavior normal.        Thought Content: Thought content normal.  Judgment: Judgment normal.      Lab Results  Component Value Date   WBC 11.1 (H) 03/09/2023   HGB 9.8 (L) 03/09/2023   HCT 30.1 (L) 03/09/2023   MCV 94.4 03/09/2023   PLT 176 03/09/2023   Lab Results  Component Value Date   FERRITIN 1,812 (H) 11/16/2022   IRON 28 11/16/2022   TIBC 211 (L) 11/16/2022   UIBC 183 11/16/2022   IRONPCTSAT 13 11/16/2022   Lab Results  Component Value Date   RETICCTPCT 0.7 10/11/2022   RBC 3.19 (L) 03/09/2023   No results found for: "KPAFRELGTCHN", "LAMBDASER", "KAPLAMBRATIO" No results found for: "IGGSERUM", "IGA", "IGMSERUM" No results found for: "TOTALPROTELP", "ALBUMINELP", "A1GS", "A2GS", "BETS", "BETA2SER", "GAMS", "MSPIKE", "SPEI"   Chemistry      Component Value Date/Time   NA 134 (L) 03/09/2023 1326   NA 143 01/01/2023 1236   K 4.2 03/09/2023 1326   CL 100 03/09/2023 1326   CO2 19 (L) 03/09/2023 1326   BUN 120 (H) 03/09/2023 1326   BUN 57 (H) 01/01/2023 1236   CREATININE 3.34 (H) 03/09/2023 1326      Component Value Date/Time   CALCIUM 8.5 (L) 03/09/2023 1326   ALKPHOS 78 03/09/2023 1326   AST 9 (L) 03/09/2023 1326   ALT 10 03/09/2023 1326   BILITOT 0.4 03/09/2023 1326       Impression and Plan: Mr. Nalley is a 57 yo caucasian female with metastatic endometrial cancer.  She has been on quite a few treatments.    I am a say that I cannot remember an endometrial carcinoma metastasized to the lower leg.  It will be very interesting to see what the  molecular markers have to show.  Patient we will still try to hold off on systemic chemotherapy for right now.  We will have her get through radiation therapy first.  It sounds like she will start after she gets back from her beach trip with her sister.  I probably will have her come back to see me in another 3 weeks.  At that point in time, then we might be in a position to discuss systemic chemotherapy.  I thought about liposomal Adriamycin which may not be a bad idea since she has decent cardiac function now.   [name], MD 8/16/20242:39 PM

## 2023-03-09 NOTE — Telephone Encounter (Signed)
Notified Patient by voicemail of prior authorization approval for Lidocaine 5% Patches. Medication is approved through 06/07/2023.

## 2023-03-09 NOTE — Progress Notes (Signed)
Per Dr Myna Hidalgo, request for [facility] One testing sent on specimen [id number] DOS 03/02/2023.  Oncology Nurse Navigator Documentation     03/09/2023    2:30 PM  Oncology Nurse Navigator Flowsheets  Navigator Location [facility]  Navigator Encounter Type Molecular Studies  Patient Visit Type MedOnc  Treatment Phase Active Tx  Barriers/Navigation Needs Coordination of Care  Interventions Coordination of Care  Acuity Level 2-Minimal Needs (1-2 Barriers Identified)  Coordination of Care Pathology  Support Groups/Services Friends and Family  Time Spent with Patient 30

## 2023-03-09 NOTE — Telephone Encounter (Signed)
Rn called pt for meaningful use and nurse evaluation. Consult note completed and routed to Dr. Basilio Cairo for review.

## 2023-03-10 LAB — CA 125: Cancer Antigen (CA) 125: 550 U/mL — ABNORMAL HIGH (ref 0.0–38.1)

## 2023-03-10 NOTE — Progress Notes (Signed)
Radiation Oncology         ([phone]) [phone] ________________________________  Outpatient Re-Consultation   Name: Jaime Gomez MRN: [id number]  Date: 03/12/2023  DOB: 1965-12-23  WU:[id number], Vinetta Bergamo, MD  [name], MD   REFERRING PHYSICIAN: [name], MD  DIAGNOSIS: {There were no encounter diagnoses. (Refresh or delete this SmartLink)}  The primary encounter diagnosis was Endometrial cancer, FIGO stage IVB (HCC). A diagnosis of Cancer, metastatic to bone [facility]) was also pertinent to this visit.   Metastatic endometrial cancer: initially diagnosed in 2012 with 2 synchronous primaries of stage IC adenocarcinoma of the ovary and stage IIIC2 moderately differentiated endometrioid adenocarcinoma of the uterus              - PET showed pulmonary metastases and a new tumor involving the lower sacrum              - PET performed in January 2024 showed progression of bilateral pulmonary metastases/masses in the RUL, RLL, and LUL  - Recent imaging in July 2024 with a new right tibial mass consistent with metastatic endometrial cancer primary (molecular studies pending)   Cancer Staging  Endometrial cancer, FIGO stage IVB (HCC) Staging form: Corpus Uteri - Carcinoma and Carcinosarcoma, AJCC 8th Edition - Clinical stage from 08/18/2021: FIGO Stage IVB (rcT3a, cNX, pM1) - Signed by [name], MD on 08/18/2021  HISTORY OF PRESENT ILLNESS::Jaime Gomez is a 57 y.o. female who is accompanied by ***. she is seen as a courtesy of Dr. Myna Hidalgo for an opinion concerning radiation therapy as part of management for her recently diagnosed right tibial mas likely from a metastatic endometrial cancer primary (biopsy pending). She was last seen for her final radiation treatment to the right lung on 10/23/22. She opted to discontinue radiation early due to feeling overwhelmed at the time. She otherwise tolerated her previous course of radiation therapy well other than fatigue.   Since completing  radiation therapy, the patient followed up with Dr. Myna Hidalgo on 11/16/22. Labs reviewed at that time showed low hemoglobin down to 5, and CA-125 up to 581. In light of this and her progressive fatigue/tiredness, Femara and Afinitor were subsequently discontinued. In light of her multiple comorbidities, Dr. Myna Hidalgo held off on starting her on a second line systemic treatment at that time.    She was then hospitalized from 11/28/22 through 12/12/22 with sepsis secondary to UTI, CVA, cardiomyopathy, encephalopathy and chronic RLE DVT. Her hospital course included percutaneous tube exchange on 05/08 and port removal on 05/13. Her hospital course was also complicated by acute kidney injury.   In the setting of her bilateral hydronephrosis, the patient underwent cystoscopy, right retrograde pyelogram, right ureteral stent exchange (along with a botox injection into the bladder) on 01/12/2023 under the care of Dr. Cardell Peach.   For restaging of her disease, the patient presented for a PET scan on 02/07/23 which showed a mixed response to interval therapy, demonstrated by: an increase in size and hypermetabolism of the left perihilar lung mass; an interval decrease in hypermetabolism of the dominant right lung lesions; and new hypermetabolic osseous metastases involving the left scapula, the posterior elements at T12, and the left 9th rib. PET otherwise showed no evidence of metastatic disease within the abdomen or pelvis.   She was then hospitalized the following day from 02/08/23 through 02/10/23 with acute DVT, presumed PE, and possible pneumonia. Upon presentation, she endorsed left-sided flank pain and axillary chest pain. Hospital course included empiric antibiotics and her anticoagulation  was changed to enoxaparin. Imaging performed while inpatient includes:  -- Chest CT on 07/18 showed an increase in size of the left perihilar masses since her PET scan performed in January 2024. CT also showed an increase in size of  the cystic lesion of the right lower lobe when compared to June 2024, and several new bilateral solid pulmonary nodules when compared to her PET scan performed in January 2024. No evidence of lymphadenopathy was demonstrated in the chest otherwise.  -- CT of the abdomen and pelvis on 07/18 an increase in size of the medial RLL nodule, a new 6 mm nodule inferior to the RLL nodule, as well as the previously seen new mass-like consolidation involving the left hilum and RLL.  -- Bilateral LE venous ultrasound on 07/18 showed evidence of DVT involving the right common femoral, profunda femoral, femoral and popliteal veins, and the left femoral and popliteal veins. A heterogeneous 2.7 x 4.1 x 2.3 cm mass in the upper right calf with internal vascularity was also demonstrated (see MRI below).  -- MRI of the right lower extremity on 07/19 demonstrated: a complex subperiosteal mass along the posteromedial aspect of the proximal right tibial metaphysis, corresponding with the mass seen on her ultrasound noted above; and subsequent/resulting erosion of the adjacent tibial cortex with adjacent marrow edema and periosteal edema. No other soft tissue masses or fluid collections were identified.   Per Dr. Gustavo Lah recommendation, the patient opted to proceed with biopsy of the right tibial mass on 03/02/23. Pathology revealed metastatic carcinoma, consistent with metastatic endometrioid carcinoma  Systemic treatment (if any) will depend on molecular testing results. She is also struggling with hyperglycemia and will need to get this under control before she can be considered for any form of chemotherapy .  Regardless of biopsy results, Dr. Myna Hidalgo recommends proceeding with radiation to the right tibial mass for local pain control which we will discuss in detail today  Of note: The patient presented to the ED after suffering a fall while in the radiology department. According to her caretaker, the patient slipped by  tripping with her cane while being guided to the registration area. She did not strike her head and there was no loss of consciousness. A CT of the head and cervical spine were performed and were both negative for any acute trauma.   PREVIOUS RADIATION THERAPY: Yes   2) Indication for treatment: Curative       Radiation treatment dates: 10/12/22 through 10/23/22  Site/dose: Right lung - 25 Gy delivered in 5 Fx at 5 Gy/Fx  (Initially prescribed 50 Gy to be delivered in 10 Fx at 5 Gy/Fx) Beams/energy:  6X-FFF Technique/Mode: IMRT / Photon  1) Diagnosis: Metastatic endometrial cancer - tumor involving the lower sacrum   Intent: Palliative Radiation Treatment Dates: 01/26/2022 through 02/09/2022 Site Technique Total Dose (Gy) Dose per Fx (Gy) Completed Fx Beam Energies  Lumbar Spine: Spine_Sacral IMRT 30/30 3 10/10 6X    PAST MEDICAL HISTORY:  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 failure 08/18/2021   Diabetes mellitus without complication (HCC) type 2    Endometrial cancer, FIGO stage IVB (HCC) 08/18/2021   Goals of care, counseling/discussion 08/18/2021   History of kidney stones    Hypertension    Leg DVT (deep venous thromboembolism), chronic, right (HCC) 08/18/2021   Ovarian cancer (HCC) 2013   Pneumonia 12/03/2022   Wears glasses  PAST SURGICAL HISTORY: Past Surgical History:  Procedure Laterality Date   ABDOMINAL HYSTERECTOMY     BOTOX INJECTION N/A 01/12/2023   Procedure: BOTOX INJECTION INTO BLADDER;  Surgeon: Jannifer Hick, MD;  Location: [facility];  Service: Urology;  Laterality: N/A;   CYSTOSCOPY W/ RETROGRADES Right 01/12/2023   Procedure: CYSTOSCOPY WITH RIGHT  RETROGRADE PYELOGRAM/ RIGHT URETERAL STENT CHANGE;  Surgeon: Jannifer Hick, MD;  Location: [facility];  Service: Urology;  Laterality: Right;   CYSTOSCOPY W/ URETERAL STENT PLACEMENT Bilateral  09/01/2021   Procedure: CYSTOSCOPY WITH RETROGRADE PYELOGRAM/URETERAL STENT PLACEMENT;  Surgeon: Jannifer Hick, MD;  Location: [facility];  Service: Urology;  Laterality: Bilateral;   CYSTOSCOPY WITH FULGERATION N/A 01/12/2023   Procedure: CYSTOSCOPY WITH FULGERATION;  Surgeon: Jannifer Hick, MD;  Location[facility];  Service: Urology;  Laterality: N/A;   CYSTOSCOPY WITH STENT PLACEMENT Right 05/19/2022   Procedure: CYSTOSCOPY WITH STENT CHANGE;  Surgeon: Jannifer Hick, MD;  Location: [facility];  Service: Urology;  Laterality: Right;  ONLY NEEDS 30 MIN   HERNIA REPAIR     IR CONVERT LEFT NEPHROSTOMY TO NEPHROURETERAL CATH  11/02/2021   IR CONVERT LEFT NEPHROSTOMY TO NEPHROURETERAL CATH  01/09/2023   IR CV LINE INJECTION  08/23/2021   IR DIL URETER LEFT  01/09/2023   IR IMAGING GUIDED PORT INSERTION  11/14/2021   IR IMAGING GUIDED PORT INSERTION  01/09/2023   IR NEPHROSTOMY EXCHANGE LEFT  11/02/2021   IR NEPHROSTOMY EXCHANGE LEFT  01/20/2022   IR NEPHROSTOMY EXCHANGE LEFT  11/29/2022   IR NEPHROSTOMY PLACEMENT LEFT  09/03/2021   IR REMOVAL TUN ACCESS W/ PORT W/O FL MOD SED  11/14/2021   IR REMOVAL TUN ACCESS W/ PORT W/O FL MOD SED  12/04/2022   IR URETERAL STENT PLACEMENT EXISTING ACCESS LEFT  02/28/2023    FAMILY HISTORY:  Family History  Problem Relation Age of Onset   Hyperlipidemia Mother    Heart disease Mother    Diabetes Mother    Depression Mother    COPD Mother    Arthritis Mother    Heart attack Mother    Drug abuse Father    Alcohol abuse Father    Hyperlipidemia Sister    Heart disease Sister    Diabetes Sister    Arthritis Sister    Miscarriages / [place] Sister    Depression Brother     SOCIAL HISTORY:  Social History   Tobacco Use   Smoking status: Never   Smokeless tobacco: Never  Vaping Use   Vaping status: Never Used  Substance Use Topics   Alcohol use: Never   Drug use: Never    ALLERGIES:  Allergies  Allergen Reactions   Paclitaxel  Anaphylaxis, Swelling and Other (See Comments)    Made everything swell   Taxotere [Docetaxel] Anaphylaxis, Swelling and Other (See Comments)    Throat swelling    MEDICATIONS:  Current Outpatient Medications  Medication Sig Dispense Refill   acetaminophen (TYLENOL) 500 MG tablet Take 500 mg by mouth every 6 (six) hours as needed for mild pain.     amLODipine (NORVASC) 2.5 MG tablet Take 1 tablet (2.5 mg total) by mouth daily. 90 tablet 1   carvedilol (COREG) 12.5 MG tablet TAKE 1 TABLET (12.5MG  TOTAL) BY MOUTH TWICE A DAY WITH MEALS 60 tablet 2   Continuous Glucose Sensor (FREESTYLE LIBRE 3 SENSOR) MISC 1 Act by Does not apply route daily. Place 1 sensor on  the skin every 14 days. Use to check glucose continuously 2 each 11   dexamethasone (DECADRON) 4 MG tablet Take 1 tablet (4 mg total) by mouth daily. 30 tablet 0   enoxaparin (LOVENOX) 60 MG/0.6ML injection Inject 0.6 mLs (60 mg total) into the skin daily. 18 mL 2   famotidine (PEPCID) 20 MG tablet Take 20 mg by mouth 2 (two) times daily as needed for heartburn or indigestion.     furosemide (LASIX) 20 MG tablet TAKE 1 TABLET BY MOUTH EVERY DAY (Patient taking differently: Take 20 mg by mouth daily as needed for fluid or edema.) 30 tablet 3   gabapentin (NEURONTIN) 300 MG capsule Take 1 capsule (300 mg total) by mouth 3 (three) times daily. 90 capsule 3   insulin detemir (LEVEMIR) 100 UNIT/ML injection Inject 0.2 mLs (20 Units total) into the skin 2 (two) times daily. (Patient taking differently: Inject 10-20 Units into the skin See admin instructions. Inject 10-20 units into the skin two times a day before meals, per sliding scale) 10 mL 4   oxybutynin (DITROPAN-XL) 10 MG 24 hr tablet Take 1 tablet (10 mg total) by mouth daily. 90 tablet 1   oxycodone (OXY-IR) 5 MG capsule Take 1 capsule (5 mg total) by mouth every 4 (four) hours as needed for pain. 120 capsule 0   polyethylene glycol (MIRALAX / GLYCOLAX) 17 g packet Take 17 g by mouth  daily as needed for mild constipation. 14 each 0   prochlorperazine (COMPAZINE) 10 MG tablet Take 10 mg by mouth every 6 (six) hours as needed for nausea or vomiting.     sodium bicarbonate 650 MG tablet Take 1 tablet (650 mg total) by mouth 3 (three) times daily. (Patient taking differently: Take 650 mg by mouth 2 (two) times daily.) 90 tablet 2   venlafaxine XR (EFFEXOR XR) 37.5 MG 24 hr capsule Take 1 capsule (37.5 mg total) by mouth daily with breakfast. 90 capsule 1   lidocaine (LIDODERM) 5 % Place 1 patch onto the skin daily. Remove & Discard patch within 12 hours or as directed by MD 30 patch 0   No current facility-administered medications for this encounter.   Facility-Administered Medications Ordered in Other Encounters  Medication Dose Route Frequency Provider Last Rate Last Admin   0.9 %  sodium chloride infusion   Intravenous Once Erenest Blank, NP        REVIEW OF SYSTEMS:  A 10+ POINT REVIEW OF SYSTEMS WAS OBTAINED including neurology, dermatology, psychiatry, cardiac, respiratory, lymph, extremities, GI, GU, musculoskeletal, constitutional, reproductive, HEENT. ***   PHYSICAL EXAM:  vitals were not taken for this visit.   General: Alert and oriented, in no acute distress HEENT: Head is normocephalic. Extraocular movements are intact. Oropharynx is clear. Neck: Neck is supple, no palpable cervical or supraclavicular lymphadenopathy. Heart: Regular in rate and rhythm with no murmurs, rubs, or gallops. Chest: Clear to auscultation bilaterally, with no rhonchi, wheezes, or rales. Abdomen: Soft, nontender, nondistended, with no rigidity or guarding. Extremities: No cyanosis or edema. Lymphatics: see Neck Exam Skin: No concerning lesions. Musculoskeletal: symmetric strength and muscle tone throughout. Neurologic: Cranial nerves II through XII are grossly intact. No obvious focalities. Speech is fluent. Coordination is intact. Psychiatric: Judgment and insight are intact. Affect  is appropriate. ***  ECOG = ***  0 - Asymptomatic (Fully active, able to carry on all predisease activities without restriction)  1 - Symptomatic but completely ambulatory (Restricted in physically strenuous activity but ambulatory and able  to carry out work of a light or sedentary nature. For example, light housework, office work)  2 - Symptomatic, <50% in bed during the day (Ambulatory and capable of all self care but unable to carry out any work activities. Up and about more than 50% of waking hours)  3 - Symptomatic, >50% in bed, but not bedbound (Capable of only limited self-care, confined to bed or chair 50% or more of waking hours)  4 - Bedbound (Completely disabled. Cannot carry on any self-care. Totally confined to bed or chair)  5 - Death   Santiago Glad MM, Creech RH, Tormey DC, et al. [phone]). "Toxicity and response criteria of the [facility]". Am. Evlyn Clines. Oncol. 5 (6): 649-55  LABORATORY DATA:  Lab Results  Component Value Date   WBC 11.1 (H) 03/09/2023   HGB 9.8 (L) 03/09/2023   HCT 30.1 (L) 03/09/2023   MCV 94.4 03/09/2023   PLT 176 03/09/2023   NEUTROABS 8.2 (H) 03/09/2023   Lab Results  Component Value Date   NA 134 (L) 03/09/2023   K 4.2 03/09/2023   CL 100 03/09/2023   CO2 19 (L) 03/09/2023   GLUCOSE 127 (H) 03/09/2023   BUN 120 (H) 03/09/2023   CREATININE 3.34 (H) 03/09/2023   CALCIUM 8.5 (L) 03/09/2023      RADIOGRAPHY: [place] CORE BIOPSY (SOFT TISSUE)  Result Date: 03/02/2023 INDICATION: 57 year old female with a history of anterior right lower leg mass. Referred for biopsy EXAM: IMAGE GUIDED BIOPSY RIGHT LEG MASS MEDICATIONS: None. ANESTHESIA/SEDATION: Moderate (conscious) sedation was employed during this procedure. A total of Versed 1.0 mg and Fentanyl 25 mcg was administered intravenously by the radiology nurse. Total intra-service moderate Sedation Time: 12 minutes. The patient's level of consciousness and vital signs were monitored  continuously by radiology nursing throughout the procedure under my direct supervision. COMPLICATIONS: None PROCEDURE: Informed written consent was obtained from the patient after a thorough discussion of the procedural risks, benefits and alternatives. All questions were addressed. Maximal Sterile Barrier Technique was utilized including caps, mask, sterile gowns, sterile gloves, sterile drape, hand hygiene and skin antiseptic. A timeout was performed prior to the initiation of the procedure. Ultrasound survey was performed with images stored and sent to PACs. The right anterior lower leg was prepped with chlorhexidine in a sterile fashion, and a sterile drape was applied covering the operative field. A sterile gown and sterile gloves were used for the procedure. Local anesthesia was provided with 1% Lidocaine. Ultrasound guidance was used to infiltrate the region with 1% lidocaine for local anesthesia. 17 gauge introducer needle was advanced under ultrasound into the soft tissue mass. Under ultrasound guidance, 5 separate 18 gauge core biopsy were then acquired of soft tissue nodule/mass using ultrasound guidance. Images were stored Final image was stored after biopsy. Patient tolerated the procedure well and remained hemodynamically stable throughout. No complications were encountered and no significant blood loss was encounter IMPRESSION: Status post ultrasound-guided right leg mass biopsy. Signed, Yvone Neu. Miachel Roux, RPVI [facility] Radiology Electronically Signed   By: Gilmer Mor D.O.   On: 03/02/2023 15:06   IR URETERAL STENT PLACEMENT EXISTING ACCESS LEFT  Result Date: 02/28/2023 INDICATION: 57 year old female presents for placement of a left ureteral stent via percutaneous access EXAM: IR URETURAL STENT PLACEMENT EXISTING ACCESS LEFT COMPARISON:  01/09/2023 MEDICATIONS: None. ANESTHESIA/SEDATION: Fentanyl 100 mcg IV; Versed 2.0 mg IV Moderate  Sedation Time:  14 minutes The patient was continuously monitored during  the procedure by the interventional radiology nurse under my direct supervision. CONTRAST:  10mL OMNIPAQUE IOHEXOL 300 MG/ML SOLN - administered into the collecting system(s) FLUOROSCOPY TIME:  Fluoroscopy Time:   (12 mGy). COMPLICATIONS: None PROCEDURE: Informed written consent was obtained from the patient after a thorough discussion of the procedural risks, benefits and alternatives. All questions were addressed. Maximal Sterile Barrier Technique was utilized including caps, mask, sterile gowns, sterile gloves, sterile drape, hand hygiene and skin antiseptic. A timeout was performed prior to the initiation of the procedure. Patient was position prone under the image intensifier. Scout images were acquired of the abdomen and pelvis. The patient is prepped and draped in the usual sterile fashion including the indwelling left-sided percutaneous nephroureteral drain. The suture was ligated. Contrast was injected confirming location. 035 wire was then passed through the indwelling drain to straightening the drain and estimated internal measurement. Using a wire through the drain with a pull-back measurement technique, we estimated 28 cm internal length, which ultimately was slightly on the longer side than required. A 26 cm stent may be reasonable. Once the measure was performed, the 035 wire was placed again through the stent and the stent was removed. The cook 8.5 French 28 cm selected ureteral stent was then passed on the wire and deployed. All catheters and drains were removed. Final images were stored. Patient tolerated the procedure well and remained hemodynamically stable throughout. No complications were encountered and no significant blood loss. IMPRESSION: Status post image guided removal of left antegrade nephroureteral drain, with placement of internal left ureteral stent, 8.5 French, 28 cm. Signed, Yvone Neu. Miachel Roux, RPVI  [facility] Radiology Electronically Signed   By: Gilmer Mor D.O.   On: 02/28/2023 14:01   CT HEAD WO CONTRAST ( )  Result Date: 02/20/2023 CLINICAL DATA:  Fall, metastatic cervical cancer EXAM: CT HEAD WITHOUT CONTRAST CT CERVICAL SPINE WITHOUT CONTRAST TECHNIQUE: Multidetector CT imaging of the head and cervical spine was performed following the standard protocol without intravenous contrast. Multiplanar CT image reconstructions of the cervical spine were also generated. RADIATION DOSE REDUCTION: This exam was performed according to the departmental dose-optimization program which includes automated exposure control, adjustment of the mA and/or kV according to patient size and/or use of iterative reconstruction technique. COMPARISON:  None Available. FINDINGS: CT HEAD FINDINGS Brain: No evidence of acute infarction, hemorrhage, hydrocephalus, extra-axial collection or mass lesion/mass effect. Periventricular white matter hypodensity. Unchanged encephalomalacia of the posterior left centrum semiovale (series 3, image 18). Vascular: No hyperdense vessel or unexpected calcification. Skull: Normal. Negative for fracture or focal lesion. Sinuses/Orbits: No acute finding. Other: None. CT CERVICAL SPINE FINDINGS Alignment: Normal. Skull base and vertebrae: No acute fracture. No primary bone lesion or focal pathologic process. Soft tissues and spinal canal: No prevertebral fluid or swelling. No visible canal hematoma. Disc levels: Mild-to-moderate multilevel disc degenerative disease, worst at C6-C7. Upper chest: Masslike perihilar consolidations of the lungs, incompletely imaged (series 11, image 12). Other: None. IMPRESSION: 1. No acute intracranial pathology. 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 2. No fracture or static subluxation of the cervical spine. 3. Mild-to-moderate multilevel disc  degenerative disease, worst at C6-C7. 4. Masslike perihilar consolidations of the lungs, incompletely imaged. Please see recent dedicated imaging of the chest Electronically Signed   By: Jearld Lesch M.D.   On: 02/20/2023 11:20   CT Cervical Spine Wo Contrast  Result Date: 02/20/2023 CLINICAL DATA:  Fall, metastatic  cervical cancer EXAM: CT HEAD WITHOUT CONTRAST CT CERVICAL SPINE WITHOUT CONTRAST TECHNIQUE: Multidetector CT imaging of the head and cervical spine was performed following the standard protocol without intravenous contrast. Multiplanar CT image reconstructions of the cervical spine were also generated. RADIATION DOSE REDUCTION: This exam was performed according to the departmental dose-optimization program which includes automated exposure control, adjustment of the mA and/or kV according to patient size and/or use of iterative reconstruction technique. COMPARISON:  None Available. FINDINGS: CT HEAD FINDINGS Brain: No evidence of acute infarction, hemorrhage, hydrocephalus, extra-axial collection or mass lesion/mass effect. Periventricular white matter hypodensity. Unchanged encephalomalacia of the posterior left centrum semiovale (series 3, image 18). Vascular: No hyperdense vessel or unexpected calcification. Skull: Normal. Negative for fracture or focal lesion. Sinuses/Orbits: No acute finding. Other: None. CT CERVICAL SPINE FINDINGS Alignment: Normal. Skull base and vertebrae: No acute fracture. No primary bone lesion or focal pathologic process. Soft tissues and spinal canal: No prevertebral fluid or swelling. No visible canal hematoma. Disc levels: Mild-to-moderate multilevel disc degenerative disease, worst at C6-C7. Upper chest: Masslike perihilar consolidations of the lungs, incompletely imaged (series 11, image 12). Other: None. IMPRESSION: 1. No acute intracranial pathology. 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 2. No  fracture or static subluxation of the cervical spine. 3. Mild-to-moderate multilevel disc degenerative disease, worst at C6-C7. 4. Masslike perihilar consolidations of the lungs, incompletely imaged. Please see recent dedicated imaging of the chest Electronically Signed   By: Jearld Lesch M.D.   On: 02/20/2023 11:20   DG Chest 1 View  Result Date: 02/20/2023 CLINICAL DATA:  Pain EXAM: CHEST  1 VIEW COMPARISON:  12/03/2022 FINDINGS: Underinflation. No pneumothorax or effusion. Normal cardiopericardial silhouette. Overlapping cardiac leads. Right IJ line in place with tip along the central SVC. Increasing perihilar opacities is identified with some nodularity. Interstitial changes as well. IMPRESSION: Underinflation. Increasing perihilar masslike areas. Please correlate with prior workup. Chest port. Electronically Signed   By: Karen Kays M.D.   On: 02/20/2023 10:42   DG Pelvis 1-2 Views  Result Date: 02/20/2023 CLINICAL DATA:  Pain after fall EXAM: PELVIS - 1 VIEW COMPARISON:  None Available. FINDINGS: No fracture or dislocation. Hyperostosis. Degenerative changes seen of the left sacroiliac joint and right hip. Diffuse vascular calcifications identified. Bilateral indwelling ureteral stents are seen extending off the edge of the imaging field. Mesh identified. IMPRESSION: Degenerative changes.  Ureteral stents. Electronically Signed   By: Karen Kays M.D.   On: 02/20/2023 10:40   ECHOCARDIOGRAM LIMITED  Result Date: 02/09/2023    ECHOCARDIOGRAM LIMITED REPORT   Patient Name:   CHEYNA ANGUS Date of Exam: 02/09/2023 Medical Rec #:  [id number]    Height:       64.0 in Accession #:    [id number]   Weight:       135.0 lb Date of Birth:  February 07, 1966    BSA:          1.655 m Patient Age:    56 years     BP:           154/73 mmHg Patient Gender: F            HR:           69 bpm. Exam Location:  Inpatient Procedure: Limited Echo, Limited Color Doppler and Cardiac Doppler Indications:    Cardiomyopathy -  unspecified  History:        Patient has prior history of Echocardiogram examinations,  most                 recent 12/04/2022. CKD, cancer; Risk Factors:Hypertension, hx PE                 and Diabetes.  Sonographer:    Wallie Char Referring Phys: [id number] PETER R ENNEVER IMPRESSIONS  1. Left ventricular ejection fraction, by estimation, is 55 to 60%. The left ventricle has normal function. The left ventricle has no regional wall motion abnormalities. Left ventricular diastolic parameters are indeterminate.  2. Right ventricular systolic function is normal. The right ventricular size is normal.  3. The mitral valve is normal in structure. Trivial mitral valve regurgitation. No evidence of mitral stenosis.  4. The aortic valve is normal in structure. Aortic valve regurgitation is not visualized. No aortic stenosis is present.  5. The inferior vena cava is normal in size with greater than 50% respiratory variability, suggesting right atrial pressure of 3 mmHg. Conclusion(s)/Recommendation(s): LVEF has normalized since previous study. FINDINGS  Left Ventricle: Left ventricular ejection fraction, by estimation, is 55 to 60%. The left ventricle has normal function. The left ventricle has no regional wall motion abnormalities. The left ventricular internal cavity size was normal in size. There is  no left ventricular hypertrophy. Left ventricular diastolic parameters are indeterminate. Right Ventricle: The right ventricular size is normal. No increase in right ventricular wall thickness. Right ventricular systolic function is normal. Left Atrium: Left atrial size was normal in size. Right Atrium: Right atrial size was normal in size. Pericardium: There is no evidence of pericardial effusion. Mitral Valve: The mitral valve is normal in structure. Trivial mitral valve regurgitation. No evidence of mitral valve stenosis. MV peak gradient, 4.4 mmHg. The mean mitral valve gradient is 2.0 mmHg. Tricuspid Valve: The tricuspid  valve is normal in structure. Tricuspid valve regurgitation is not demonstrated. No evidence of tricuspid stenosis. Aortic Valve: The aortic valve is normal in structure. Aortic valve regurgitation is not visualized. No aortic stenosis is present. Aortic valve mean gradient measures 4.0 mmHg. Aortic valve peak gradient measures 7.6 mmHg. Aortic valve area, by VTI measures 2.86 cm. Pulmonic Valve: The pulmonic valve was normal in structure. Pulmonic valve regurgitation is not visualized. No evidence of pulmonic stenosis. Aorta: The aortic root is normal in size and structure. Venous: The inferior vena cava is normal in size with greater than 50% respiratory variability, suggesting right atrial pressure of 3 mmHg. IAS/Shunts: No atrial level shunt detected by color flow Doppler. Additional Comments: Spectral Doppler performed. Color Doppler performed.  LEFT VENTRICLE PLAX 2D LVIDd:         4.90 cm LVIDs:         3.40 cm LV PW:         0.90 cm LV IVS:        0.80 cm LVOT diam:     2.10 cm LV SV:         85 LV SV Index:   51 LVOT Area:     3.46 cm  LV Volumes (MOD) LV vol d, MOD A2C: 83.2 ml LV vol d, MOD A4C: 110.0 ml LV vol s, MOD A2C: 37.6 ml LV vol s, MOD A4C: 45.5 ml LV SV MOD A2C:     45.6 ml LV SV MOD A4C:     110.0 ml LV SV MOD BP:      53.6 ml RIGHT VENTRICLE          IVC RV Basal diam:  2.60 cm  IVC diam: 1.90 cm TAPSE (M-mode): 2.0 cm LEFT ATRIUM             Index        RIGHT ATRIUM           Index LA diam:        3.10 cm 1.87 cm/m   RA Area:     10.70 cm LA Vol (A2C):   34.0 ml 20.54 ml/m  RA Volume:   19.40 ml  11.72 ml/m LA Vol (A4C):   40.1 ml 24.22 ml/m LA Biplane Vol: 38.2 ml 23.08 ml/m  AORTIC VALVE AV Area (Vmax):    2.91 cm AV Area (Vmean):   2.73 cm AV Area (VTI):     2.86 cm AV Vmax:           138.00 cm/s AV Vmean:          101.000 cm/s AV VTI:            0.295 m AV Peak Grad:      7.6 mmHg AV Mean Grad:      4.0 mmHg LVOT Vmax:         116.00 cm/s LVOT Vmean:        79.600 cm/s LVOT  VTI:          0.244 m LVOT/AV VTI ratio: 0.83  AORTA Ao Root diam: 3.40 cm MITRAL VALVE                TRICUSPID VALVE MV Area (PHT): 3.63 cm     TV Peak grad:   37.2 mmHg MV Area VTI:   3.40 cm     TV Vmax:        3.05 m/s MV Peak grad:  4.4 mmHg MV Mean grad:  2.0 mmHg     SHUNTS MV Vmax:       1.05 m/s     Systemic VTI:  0.24 m MV Vmean:      58.9 cm/s    Systemic Diam: 2.10 cm MV Decel Time: 209 msec MV E velocity: 90.60 cm/s MV A velocity: 104.00 cm/s MV E/A ratio:  0.87 Arvilla Meres MD Electronically signed by Arvilla Meres MD Signature Date/Time: 02/09/2023/3:41:58 PM    Final    MR TIBIA FIBULA RIGHT WO CONTRAST  Result Date: 02/09/2023 CLINICAL DATA:  Soft tissue mass in the right lower leg on Doppler ultrasound. DVT demonstrated at that time. History of endometrial/cervical cancer. EXAM: MRI OF LOWER RIGHT EXTREMITY WITHOUT CONTRAST TECHNIQUE: Multiplanar, multisequence MR imaging of the right lower leg was performed. No intravenous contrast was administered. COMPARISON:  Doppler ultrasound 02/08/2023.  PET-CT 02/07/2023. FINDINGS: Bones/Joint/Cartilage Both lower legs are included on the coronal images. Markers were placed around the patient's palpable concern anteromedially in the proximal 3rd of the right lower leg. Underlying complex soft tissue mass further described below, eroding the posteromedial aspect of the adjacent tibia. There is heterogeneous intramedullary T2 hyperintensity within the adjacent proximal shaft of the right tibia. There is periosteal edema without evidence of periosteal thickening. No other osseous lesions are identified. There is no evidence of acute fracture or dislocation. No significant right knee or ankle joint effusion or arthropathy. Ligaments No significant ligamentous abnormalities are identified at the right knee or ankle. Muscles and Tendons Nonspecific edema throughout the deep posterior compartment of the right lower leg. No focal muscular atrophy  identified. No evidence of tendon tear or tenosynovitis. Soft tissues As above, complex subperiosteal mass along the posteromedial aspect of  the proximal right tibial metaphysis, corresponding with the mass seen on recent ultrasound. This demonstrates heterogeneous T2 hyperintensity. On the T1 weighted images, the signal is fairly homogeneous with dependent intrinsic T1 shortening. Contrast was not administered for this examination, although internal vascularity was noted on ultrasound, implying a solid lesion. As described above, there is resulting erosion of the adjacent tibial cortex with adjacent marrow edema. No other soft tissue masses or fluid collections are identified. There is surrounding soft tissue edema which extends into the anterior pretibial subcutaneous tissues. The lower legs are not included on the recent PET-CT. Known DVT is not well demonstrated by this examination. There is a small Baker's cyst. IMPRESSION: 1. Complex subperiosteal mass along the posteromedial aspect of the proximal right tibial metaphysis, corresponding with the mass seen on recent ultrasound. Contrast was not administered for this examination, although internal vascularity was noted on ultrasound, implying a solid lesion. There is resulting erosion of the adjacent tibial cortex with adjacent marrow edema and periosteal edema. Findings are suspicious for metastatic disease in this patient with a history of endometrial carcinoma. Differential includes infection with a subperiosteal abscess and adjacent tibial osteomyelitis. Consider tissue sampling. 2. No other soft tissue masses or fluid collections identified. 3. Nonspecific edema within the deep posterior compartment of the right lower leg. 4. Known DVT is not well demonstrated by this examination. Electronically Signed   By: Carey Bullocks M.D.   On: 02/09/2023 14:57   US Venous Img Lower Bilateral (DVT)  Result Date: 02/08/2023 CLINICAL DATA:  DVT (deep venous  thrombosis) (HCC) . EXAM: BILATERAL LOWER EXTREMITY VENOUS DOPPLER ULTRASOUND TECHNIQUE: Gray-scale sonography with graded compression, as well as color Doppler and duplex ultrasound were performed to evaluate the lower extremity deep venous systems from the level of the common femoral vein and including the common femoral, femoral, profunda femoral, popliteal and calf veins including the posterior tibial, peroneal and gastrocnemius veins when visible. The superficial great saphenous vein was also interrogated. Spectral Doppler was utilized to evaluate flow at rest and with distal augmentation maneuvers in the common femoral, femoral and popliteal veins. COMPARISON:  None Available. FINDINGS: RIGHT LOWER EXTREMITY Common Femoral Vein: Positive for thrombus. Saphenofemoral Junction: No evidence of thrombus. Normal compressibility and flow on color Doppler imaging. Profunda Femoral Vein: Positive for thrombus. Femoral Vein: Positive for thrombus. Popliteal Vein: Positive for thrombus. Calf Veins: Limited visualization. Other: Heterogeneous 2.7 x 4.1 x 2.3 cm mass in the upper right calf with internal vascularity LEFT LOWER EXTREMITY Common Femoral Vein: No evidence of thrombus. Normal compressibility, respiratory phasicity and response to augmentation. Saphenofemoral Junction: No evidence of thrombus. Normal compressibility and flow on color Doppler imaging. Profunda Femoral Vein: No evidence of thrombus. Normal compressibility and flow on color Doppler imaging. Femoral Vein: Positive for thrombus. Popliteal Vein: Positive for thrombus. Calf Veins: Not well visualized. IMPRESSION: 1. Positive for DVT involving the right common femoral, profunda femoral, femoral and popliteal veins and the left femoral and popliteal veins. 2. Heterogeneous 2.7 x 4.1 x 2.3 cm mass in the upper right calf with internal vascularity. Recommend MRI to further evaluate. Electronically Signed   By: Feliberto Harts M.D.   On: 02/08/2023  14:21      IMPRESSION: Recent imaging in July 2024 with a new right tibial mass consistent with metastatic endometrial cancer primary (molecular studies pending)  ***  Today, I talked to the patient and family about the findings and work-up thus far.  We discussed the natural history of ***  and general treatment, highlighting the role of radiotherapy in the management.  We discussed the available radiation techniques, and focused on the details of logistics and delivery.  We reviewed the anticipated acute and late sequelae associated with radiation in this setting.  The patient was encouraged to ask questions that I answered to the best of my ability. *** A patient consent form was discussed and signed.  We retained a copy for our records.  The patient would like to proceed with radiation and will be scheduled for CT simulation.  PLAN: ***    *** minutes of total time was spent for this patient encounter, including preparation, face-to-face counseling with the patient and coordination of care, physical exam, and documentation of the encounter.   ------------------------------------------------  Billie Lade, PhD, MD  This document serves as a record of services personally performed by Antony Blackbird, MD. It was created on his behalf by Neena Rhymes, a trained medical scribe. The creation of this record is based on the scribe's personal observations and the provider's statements to them. This document has been checked and approved by the attending provider.

## 2023-03-10 NOTE — Progress Notes (Signed)
  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   Metastatic endometrial cancer: initially diagnosed in 2012 with 2 synchronous primaries of stage IC adenocarcinoma of the ovary and stage IIIC2 moderately differentiated endometrioid adenocarcinoma of the uterus              - PET showed pulmonary metastases and a new tumor involving the lower sacrum              - Recent PET performed in January 2024 shows progression of bilateral pulmonary metastases/masses in the RUL, RLL, and LUL   Cancer Staging  Endometrial cancer, FIGO stage IVB (HCC) Staging form: Corpus Uteri - Carcinoma and Carcinosarcoma, AJCC 8th Edition - Clinical stage from 08/18/2021: FIGO Stage IVB (rcT3a, cNX, pM1) - Signed by [name], MD on 08/18/2021  Indication for treatment: Curative        Radiation treatment dates: 10/12/22 through 10/23/22   Site/dose: Right lung - 25 Gy delivered in 5 Fx at 5 Gy/Fx  (Initially prescribed 50 Gy to be delivered in 10 Fx at 5 Gy/Fx)  Beams/energy:  6X-FFF  Technique/Mode: IMRT / Photon  Narrative: The patient tolerated radiation treatment relatively well. She however opted to discontinue radiation therapy early due to feeling overwhelmed. During her final weekly treatment check on 10/17/22, the patient endorsed fatigue. She denied any other side effects from radiation therapy.   Plan: The patient has completed radiation treatment. The patient will return to radiation oncology clinic for routine followup in one month. I advised them to call or return sooner if they have any questions or concerns related to their recovery or treatment.  -----------------------------------  Billie Lade, PhD, MD  This document serves as a record of services personally performed by Antony Blackbird, MD. It was created on his behalf by Neena Rhymes, a trained medical scribe. The creation of this record is based on the scribe's personal observations and the provider's statements to them. This document has been checked and approved by the attending provider.

## 2023-03-12 ENCOUNTER — Ambulatory Visit
Admission: RE | Admit: 2023-03-12 | Discharge: 2023-03-12 | Disposition: A | Discharge status: Home / Self Care | Payer: 59 | Source: Ambulatory Visit | Attending: Radiation Oncology | Admitting: Radiation Oncology

## 2023-03-12 ENCOUNTER — Other Ambulatory Visit: Payer: Self-pay

## 2023-03-12 VITALS — BP 101/67 | HR 70 | Temp 97.3°F | Resp 18 | Ht 64.0 in | Wt 142.4 lb

## 2023-03-12 DIAGNOSIS — C541 Malignant neoplasm of endometrium: Secondary | ICD-10-CM

## 2023-03-12 DIAGNOSIS — C7951 Secondary malignant neoplasm of bone: Secondary | ICD-10-CM | POA: Diagnosis not present

## 2023-03-12 DIAGNOSIS — R5383 Other fatigue: Secondary | ICD-10-CM | POA: Diagnosis not present

## 2023-03-12 DIAGNOSIS — C7801 Secondary malignant neoplasm of right lung: Secondary | ICD-10-CM | POA: Diagnosis not present

## 2023-03-12 DIAGNOSIS — C7802 Secondary malignant neoplasm of left lung: Secondary | ICD-10-CM | POA: Insufficient documentation

## 2023-03-12 DIAGNOSIS — Z923 Personal history of irradiation: Secondary | ICD-10-CM | POA: Insufficient documentation

## 2023-03-12 LAB — IRON AND IRON BINDING CAPACITY (CC-WL,HP ONLY)
Iron: 57 ug/dL (ref 28–170)
Saturation Ratios: 27 % (ref 10.4–31.8)
TIBC: 213 ug/dL — ABNORMAL LOW (ref 250–450)
UIBC: 156 ug/dL (ref 148–442)

## 2023-03-12 NOTE — Progress Notes (Deleted)
Palliative Medicine [facility]  Telephone:([phone] Fax:([phone]) [phone]   Name: Jaime Gomez Date: 03/12/2023 MRN: [id number]  DOB: 1966-07-18  Patient Care Team: Karie Georges, MD as PCP - General (Family Medicine) Croitoru, Rachelle Hora, MD as PCP - Cardiology (Cardiology) Crist Fat, MD as Consulting Physician (Urology)    REASON FOR CONSULTATION: Jaime Gomez is a 57 y.o. 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  Palliative ask to see for symptom management and goals of care.    SOCIAL HISTORY:     reports that she has never smoked. She has never used smokeless tobacco. She reports that she does not drink alcohol and does not use drugs.  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 failure 08/18/2021   Diabetes mellitus without complication (HCC) type 2    Endometrial cancer, FIGO stage IVB (HCC) 08/18/2021   Goals of care, counseling/discussion 08/18/2021   History of kidney stones    Hypertension    Leg DVT (deep venous thromboembolism), chronic, right (HCC) 08/18/2021   Ovarian cancer (HCC) 2013   Pneumonia 12/03/2022   Wears glasses     PAST SURGICAL HISTORY:  Past Surgical History:  Procedure Laterality Date   ABDOMINAL HYSTERECTOMY     BOTOX INJECTION N/A 01/12/2023   Procedure: BOTOX INJECTION INTO BLADDER;  Surgeon: Jannifer Hick, MD;  Location: [facility];  Service: Urology;  Laterality: N/A;   CYSTOSCOPY W/ RETROGRADES Right 01/12/2023   Procedure: CYSTOSCOPY WITH RIGHT  RETROGRADE PYELOGRAM/ RIGHT URETERAL STENT CHANGE;  Surgeon: Jannifer Hick, MD;   Location: [facility];  Service: Urology;  Laterality: Right;   CYSTOSCOPY W/ URETERAL STENT PLACEMENT Bilateral 09/01/2021   Procedure: CYSTOSCOPY WITH RETROGRADE PYELOGRAM/URETERAL STENT PLACEMENT;  Surgeon: Jannifer Hick, MD;  Location: [facility];  Service: Urology;  Laterality: Bilateral;   CYSTOSCOPY WITH FULGERATION N/A 01/12/2023   Procedure: CYSTOSCOPY WITH FULGERATION;  Surgeon: Jannifer Hick, MD;  Location: [facility];  Service: Urology;  Laterality: N/A;   CYSTOSCOPY WITH STENT PLACEMENT Right 05/19/2022   Procedure: CYSTOSCOPY WITH STENT CHANGE;  Surgeon: Jannifer Hick, MD;  Location: [facility];  Service: Urology;  Laterality: Right;  ONLY NEEDS 30 MIN   HERNIA REPAIR     IR CONVERT LEFT NEPHROSTOMY TO NEPHROURETERAL CATH  11/02/2021   IR CONVERT LEFT NEPHROSTOMY TO NEPHROURETERAL CATH  01/09/2023   IR CV LINE INJECTION  08/23/2021   IR DIL URETER LEFT  01/09/2023   IR IMAGING GUIDED PORT INSERTION  11/14/2021   IR IMAGING GUIDED PORT INSERTION  01/09/2023   IR NEPHROSTOMY EXCHANGE LEFT  11/02/2021   IR NEPHROSTOMY EXCHANGE LEFT  01/20/2022   IR NEPHROSTOMY EXCHANGE LEFT  11/29/2022   IR NEPHROSTOMY PLACEMENT LEFT  09/03/2021   IR REMOVAL TUN ACCESS W/ PORT W/O FL MOD SED  11/14/2021   IR REMOVAL TUN ACCESS W/ PORT W/O FL MOD SED  12/04/2022   IR URETERAL STENT PLACEMENT EXISTING ACCESS LEFT  02/28/2023    HEMATOLOGY/ONCOLOGY HISTORY:  Oncology History  Endometrial cancer, FIGO stage IVB (HCC)  08/18/2021 Initial Diagnosis   Endometrial  cancer, FIGO stage IVB (HCC)   08/18/2021 Cancer Staging   Staging form: Corpus Uteri - Carcinoma and Carcinosarcoma, AJCC 8th Edition - Clinical stage from 08/18/2021: FIGO Stage IVB (rcT3a, cNX, pM1) - Signed by [name], MD on 08/18/2021 Stage prefix: Recurrence Histologic grade (G): G2 Histologic grading system: 3 grade system     ALLERGIES:  is allergic to paclitaxel and taxotere [docetaxel].  MEDICATIONS:   Current Outpatient Medications  Medication Sig Dispense Refill   acetaminophen (TYLENOL) 500 MG tablet Take 500 mg by mouth every 6 (six) hours as needed for mild pain.     amLODipine (NORVASC) 2.5 MG tablet Take 1 tablet (2.5 mg total) by mouth daily. 90 tablet 1   carvedilol (COREG) 12.5 MG tablet TAKE 1 TABLET (12.5MG  TOTAL) BY MOUTH TWICE A DAY WITH MEALS 60 tablet 2   Continuous Glucose Sensor (FREESTYLE LIBRE 3 SENSOR) MISC 1 Act by Does not apply route daily. Place 1 sensor on the skin every 14 days. Use to check glucose continuously 2 each 11   dexamethasone (DECADRON) 4 MG tablet Take 1 tablet (4 mg total) by mouth daily. 30 tablet 0   enoxaparin (LOVENOX) 60 MG/0.6ML injection Inject 0.6 mLs (60 mg total) into the skin daily. 18 mL 2   famotidine (PEPCID) 20 MG tablet Take 20 mg by mouth 2 (two) times daily as needed for heartburn or indigestion.     furosemide (LASIX) 20 MG tablet TAKE 1 TABLET BY MOUTH EVERY DAY (Patient taking differently: Take 20 mg by mouth daily as needed for fluid or edema.) 30 tablet 3   gabapentin (NEURONTIN) 300 MG capsule Take 1 capsule (300 mg total) by mouth 3 (three) times daily. 90 capsule 3   insulin detemir (LEVEMIR) 100 UNIT/ML injection Inject 0.2 mLs (20 Units total) into the skin 2 (two) times daily. (Patient taking differently: Inject 10-20 Units into the skin See admin instructions. Inject 10-20 units into the skin two times a day before meals, per sliding scale) 10 mL 4   lidocaine (LIDODERM) 5 % Place 1 patch onto the skin daily. Remove & Discard patch within 12 hours or as directed by MD 30 patch 0   oxybutynin (DITROPAN-XL) 10 MG 24 hr tablet Take 1 tablet (10 mg total) by mouth daily. 90 tablet 1   oxycodone (OXY-IR) 5 MG capsule Take 1 capsule (5 mg total) by mouth every 4 (four) hours as needed for pain. 120 capsule 0   polyethylene glycol (MIRALAX / GLYCOLAX) 17 g packet Take 17 g by mouth daily as needed for mild constipation. 14 each 0    prochlorperazine (COMPAZINE) 10 MG tablet Take 10 mg by mouth every 6 (six) hours as needed for nausea or vomiting.     sodium bicarbonate 650 MG tablet Take 1 tablet (650 mg total) by mouth 3 (three) times daily. (Patient taking differently: Take 650 mg by mouth 2 (two) times daily.) 90 tablet 2   venlafaxine XR (EFFEXOR XR) 37.5 MG 24 hr capsule Take 1 capsule (37.5 mg total) by mouth daily with breakfast. 90 capsule 1   No current facility-administered medications for this visit.   Facility-Administered Medications Ordered in Other Visits  Medication Dose Route Frequency Provider Last Rate Last Admin   0.9 %  sodium chloride infusion   Intravenous Once Erenest Blank, NP        VITAL SIGNS: There were no vitals taken for this visit. There were no vitals filed for this visit.  Estimated body mass index is 24.44 kg/m as calculated from the following:   Height as of 03/12/23: 5\' 4"  (1.626 m).   Weight as of 03/12/23: 142 lb 6 oz (64.6 kg).  LABS: CBC:    Component Value Date/Time   WBC 11.1 (H) 03/09/2023 1326   WBC 13.2 (H) 03/02/2023 1127   HGB 9.8 (L) 03/09/2023 1326   HCT 30.1 (L) 03/09/2023 1326   PLT 176 03/09/2023 1326   MCV 94.4 03/09/2023 1326   NEUTROABS 8.2 (H) 03/09/2023 1326   LYMPHSABS 1.1 03/09/2023 1326   MONOABS 0.9 03/09/2023 1326   EOSABS 0.4 03/09/2023 1326   BASOSABS 0.0 03/09/2023 1326   Comprehensive Metabolic Panel:    Component Value Date/Time   NA 134 (L) 03/09/2023 1326   NA 143 01/01/2023 1236   K 4.2 03/09/2023 1326   CL 100 03/09/2023 1326   CO2 19 (L) 03/09/2023 1326   BUN 120 (H) 03/09/2023 1326   BUN 57 (H) 01/01/2023 1236   CREATININE 3.34 (H) 03/09/2023 1326   GLUCOSE 127 (H) 03/09/2023 1326   CALCIUM 8.5 (L) 03/09/2023 1326   AST 9 (L) 03/09/2023 1326   ALT 10 03/09/2023 1326   ALKPHOS 78 03/09/2023 1326   BILITOT 0.4 03/09/2023 1326   PROT 6.6 03/09/2023 1326   ALBUMIN 3.6 03/09/2023 1326    RADIOGRAPHIC STUDIES: CT HEAD WO  CONTRAST ( )  Result Date: 02/20/2023 CLINICAL DATA:  Fall, metastatic cervical cancer EXAM: CT HEAD WITHOUT CONTRAST CT CERVICAL SPINE WITHOUT CONTRAST TECHNIQUE: Multidetector CT imaging of the head and cervical spine was performed following the standard protocol without intravenous contrast. Multiplanar CT image reconstructions of the cervical spine were also generated. RADIATION DOSE REDUCTION: This exam was performed according to the departmental dose-optimization program which includes automated exposure control, adjustment of the mA and/or kV according to patient size and/or use of iterative reconstruction technique. COMPARISON:  None Available. FINDINGS: CT HEAD FINDINGS Brain: No evidence of acute infarction, hemorrhage, hydrocephalus, extra-axial collection or mass lesion/mass effect. Periventricular white matter hypodensity. Unchanged encephalomalacia of the posterior left centrum semiovale (series 3, image 18). Vascular: No hyperdense vessel or unexpected calcification. Skull: Normal. Negative for fracture or focal lesion. Sinuses/Orbits: No acute finding. Other: None. CT CERVICAL SPINE FINDINGS Alignment: Normal. Skull base and vertebrae: No acute fracture. No primary bone lesion or focal pathologic process. Soft tissues and spinal canal: No prevertebral fluid or swelling. No visible canal hematoma. Disc levels: Mild-to-moderate multilevel disc degenerative disease, worst at C6-C7. Upper chest: Masslike perihilar consolidations of the lungs, incompletely imaged (series 11, image 12). Other: None. IMPRESSION: 1. No acute intracranial pathology. 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 2. No fracture or static subluxation of the cervical spine. 3. Mild-to-moderate multilevel disc degenerative disease, worst at C6-C7. 4. Masslike perihilar consolidations of the lungs, incompletely imaged. Please see recent dedicated imaging of the chest  Electronically Signed   By: Jearld Lesch M.D.   On: 02/20/2023 11:20   CT Cervical Spine Wo Contrast  Result Date: 02/20/2023 CLINICAL DATA:  Fall, metastatic cervical cancer EXAM: CT HEAD WITHOUT CONTRAST CT CERVICAL SPINE WITHOUT CONTRAST TECHNIQUE: Multidetector CT imaging of the head and cervical spine was performed following the standard protocol without intravenous contrast. Multiplanar CT image reconstructions of the cervical spine were also generated. RADIATION DOSE REDUCTION: This exam was performed according to the departmental dose-optimization program which includes automated exposure control, adjustment of the mA and/or kV according to patient  size and/or use of iterative reconstruction technique. COMPARISON:  None Available. FINDINGS: CT HEAD FINDINGS Brain: No evidence of acute infarction, hemorrhage, hydrocephalus, extra-axial collection or mass lesion/mass effect. Periventricular white matter hypodensity. Unchanged encephalomalacia of the posterior left centrum semiovale (series 3, image 18). Vascular: No hyperdense vessel or unexpected calcification. Skull: Normal. Negative for fracture or focal lesion. Sinuses/Orbits: No acute finding. Other: None. CT CERVICAL SPINE FINDINGS Alignment: Normal. Skull base and vertebrae: No acute fracture. No primary bone lesion or focal pathologic process. Soft tissues and spinal canal: No prevertebral fluid or swelling. No visible canal hematoma. Disc levels: Mild-to-moderate multilevel disc degenerative disease, worst at C6-C7. Upper chest: Masslike perihilar consolidations of the lungs, incompletely imaged (series 11, image 12). Other: None. IMPRESSION: 1. No acute intracranial pathology. 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 2. No fracture or static subluxation of the cervical spine. 3. Mild-to-moderate multilevel disc degenerative disease, worst at C6-C7. 4. Masslike perihilar  consolidations of the lungs, incompletely imaged. Please see recent dedicated imaging of the chest Electronically Signed   By: Jearld Lesch M.D.   On: 02/20/2023 11:20    PERFORMANCE STATUS (ECOG) : {CHL ONC ECOG OZ:[id number]}  Review of Systems Unless otherwise noted, a complete review of systems is negative.  Physical Exam General: NAD Cardiovascular: regular rate and rhythm Pulmonary: clear ant fields Abdomen: soft, nontender, + bowel sounds Extremities: no edema, no joint deformities Skin: no rashes Neurological:  IMPRESSION: *** I introduced myself, Jaslyne Beeck RN, and Palliative's role in collaboration with the oncology team. Concept of Palliative Care was introduced as specialized medical care for people and their families living with serious illness.  It focuses on providing relief from the symptoms and stress of a serious illness.  The goal is to improve quality of life for both the patient and the family. Values and goals of care important to patient and family were attempted to be elicited.    We discussed *** current illness and what it means in the larger context of *** on-going co-morbidities. Natural disease trajectory and expectations were discussed.  I discussed the importance of continued conversation with family and their medical providers regarding overall plan of care and treatment options, ensuring decisions are within the context of the patients values and GOCs.  PLAN: Established therapeutic relationship. Education provided on palliative's role in collaboration with their Oncology/Radiation team. I will plan to see patient back in 2-4 weeks in collaboration to other oncology appointments.    Patient expressed understanding and was in agreement with this plan. She also understands that She can call the clinic at any time with any questions, concerns, or complaints.   Thank you for your referral and allowing Palliative to assist in Mrs. Joanie Coddington care.    Number and complexity of problems addressed: ***HIGH - 1 or more chronic illnesses with SEVERE exacerbation, progression, or side effects of treatment - advanced cancer, pain. Any controlled substances utilized were prescribed in the context of palliative care.   Visit consisted of counseling and education dealing with the complex and emotionally intense issues of symptom management and palliative care in the setting of serious and potentially life-threatening illness.Greater than 50%  of this time was spent counseling and coordinating care related to the above assessment and plan.  Signed by: Willette Alma, AGPCNP-BC Palliative Medicine Team/[facility]   *Please note that this is a verbal dictation therefore any spelling or grammatical errors are due to the "[employer]" system interpretation.

## 2023-03-13 ENCOUNTER — Inpatient Hospital Stay: Payer: 59 | Admitting: Nurse Practitioner

## 2023-03-14 ENCOUNTER — Other Ambulatory Visit: Payer: Self-pay | Admitting: Orthopaedic Surgery

## 2023-03-14 DIAGNOSIS — D492 Neoplasm of unspecified behavior of bone, soft tissue, and skin: Secondary | ICD-10-CM | POA: Diagnosis not present

## 2023-03-16 ENCOUNTER — Telehealth: Payer: Self-pay

## 2023-03-16 NOTE — Telephone Encounter (Signed)
Attempted to call pt to r/s missed palliative appointment, no answer, LVM and call back number

## 2023-03-16 NOTE — Patient Instructions (Addendum)
SURGICAL WAITING ROOM VISITATION Patients having surgery or a procedure may have no more than 2 support people in the waiting area - these visitors may rotate.    Children under the age of 35 must have an adult with them who is not the patient.  If the patient needs to stay at the hospital during part of their recovery, the visitor guidelines for inpatient rooms apply. Pre-op nurse will coordinate an appropriate time for 1 support person to accompany patient in pre-op.  This support person may not rotate.    Please refer to the [facility] website for the visitor guidelines for Inpatients (after your surgery is over and you are in a regular room).       Your procedure is scheduled on: 03-27-23   Report to [facility] Main Entrance    Report to admitting at 7:45 AM   Call this number if you have problems the morning of surgery [phone]   Do not eat food :After Midnight.   After Midnight you may have the following liquids until 7:00 AM DAY OF SURGERY  Water Non-Citrus Juices (without pulp, NO RED-Apple, White grape, White cranberry) Black Coffee (NO MILK/CREAM OR CREAMERS, sugar ok)  Clear Tea (NO MILK/CREAM OR CREAMERS, sugar ok) regular and decaf                             Plain Jell-O (NO RED)                                           Fruit ices (not with fruit pulp, NO RED)                                     Popsicles (NO RED)                                                               Sports drinks like Gatorade (NO RED)                   The day of surgery:  Drink ONE (1) Pre-Surgery G2 at 7:00 AM the morning of surgery. Drink in one sitting. Do not sip.  This drink was given to you during your hospital  pre-op appointment visit. Nothing else to drink after completing the Pre-Surgery G2.          If you have questions, please contact your surgeon's office.   FOLLOW  ANY ADDITIONAL PRE OP INSTRUCTIONS YOU RECEIVED FROM YOUR SURGEON'S OFFICE!!!     Oral  Hygiene is also important to reduce your risk of infection.                                    Remember - BRUSH YOUR TEETH THE MORNING OF SURGERY WITH YOUR REGULAR TOOTHPASTE   Do NOT smoke after Midnight   Take these medicines the morning of surgery with A SIP OF WATER:   Amlodipine  Carvedilol  Famotidine  Gabapentin  Oxybutynin  Venlafaxine  If needed Compazine, Oxycodone, Tylenol  Stop all vitamins and herbal supplements 7 days before surgery  How to Manage Your Diabetes Before and After Surgery  Why is it important to control my blood sugar before and after surgery? Improving blood sugar levels before and after surgery helps healing and can limit problems. A way of improving blood sugar control is eating a healthy diet by:  Eating less sugar and carbohydrates  Increasing activity/exercise  Talking with your doctor about reaching your blood sugar goals High blood sugars (greater than 180 mg/dL) can raise your risk of infections and slow your recovery, so you will need to focus on controlling your diabetes during the weeks before surgery. Make sure that the doctor who takes care of your diabetes knows about your planned surgery including the date and location.  How do I manage my blood sugar before surgery? Check your blood sugar at least 4 times a day, starting 2 days before surgery, to make sure that the level is not too high or low. Check your blood sugar the morning of your surgery when you wake up and every 2 hours until you get to the Short Stay unit. If your blood sugar is less than 70 mg/dL, you will need to treat for low blood sugar: Do not take insulin. Treat a low blood sugar (less than 70 mg/dL) with  cup of clear juice (cranberry or apple), 4 glucose tablets, OR glucose gel. Recheck blood sugar in 15 minutes after treatment (to make sure it is greater than 70 mg/dL). If your blood sugar is not greater than 70 mg/dL on recheck, call [phone] for further  instructions. Report your blood sugar to the short stay nurse when you get to Short Stay.  If you are admitted to the hospital after surgery: Your blood sugar will be checked by the staff and you will probably be given insulin after surgery (instead of oral diabetes medicines) to make sure you have good blood sugar levels. The goal for blood sugar control after surgery is 80-180 mg/dL.   WHAT DO I DO ABOUT MY DIABETES MEDICATION?  Do not take oral diabetes medicines (pills) the morning of surgery.  THE NIGHT BEFORE SURGERY,  Take 1/2 dose of Levemir insulin.       THE MORNING OF SURGERY,Take 1/2 dose of Levemir insulin.          Take Novolog 1/2 dose if CBG 220 or higher  DO NOT TAKE THE FOLLOWING 7 DAYS PRIOR TO SURGERY: Ozempic, Wegovy, Rybelsus (Semaglutide), Byetta (exenatide), Bydureon (exenatide ER), Victoza, Saxenda (liraglutide), or Trulicity (dulaglutide) Mounjaro (Tirzepatide) Adlyxin (Lixisenatide), Polyethylene Glycol Loxenatide.   Reviewed and Endorsed by [facility] Patient Education Committee, August 2015                              You may not have any metal on your body including hair pins, jewelry, and body piercing             Do not wear make-up, lotions, powders, perfumes or deodorant  Do not wear nail polish including gel and S&S, artificial/acrylic nails, or any other type of covering on natural nails including finger and toenails. If you have artificial nails, gel coating, etc. that needs to be removed by a nail salon please have this removed prior to surgery or surgery may need to be canceled/ delayed if the surgeon/ anesthesia feels like they are unable  to be safely monitored.   Do not shave  48 hours prior to surgery.      Do not bring valuables to the hospital. [facility] IS NOT RESPONSIBLE   FOR VALUABLES.   Contacts, dentures or bridgework may not be worn into surgery.  DO NOT BRING YOUR HOME MEDICATIONS TO THE HOSPITAL. PHARMACY WILL DISPENSE  MEDICATIONS LISTED ON YOUR MEDICATION LIST TO YOU DURING YOUR ADMISSION IN THE HOSPITAL!    Patients discharged on the day of surgery will not be allowed to drive home.  Someone NEEDS to stay with you for the first 24 hours after anesthesia.              Please read over the following fact sheets you were given: IF YOU HAVE QUESTIONS ABOUT YOUR PRE-OP INSTRUCTIONS PLEASE CALL [phone] [facility]  If you received a COVID test during your pre-op visit  it is requested that you wear a mask when out in public, stay away from anyone that may not be feeling well and notify your surgeon if you develop symptoms. If you test positive for Covid or have been in contact with anyone that has tested positive in the last 10 days please notify you surgeon.  [facility] - Preparing for Surgery Before surgery, you can play an important role.  Because skin is not sterile, your skin needs to be as free of germs as possible.  You can reduce the number of germs on your skin by washing with CHG (chlorahexidine gluconate) soap before surgery.  CHG is an antiseptic cleaner which kills germs and bonds with the skin to continue killing germs even after washing. Please DO NOT use if you have an allergy to CHG or antibacterial soaps.  If your skin becomes reddened/irritated stop using the CHG and inform your nurse when you arrive at Short Stay. Do not shave (including legs and underarms) for at least 48 hours prior to the first CHG shower.  You may shave your face/neck.  Please follow these instructions carefully:  1.  Shower with CHG Soap the night before surgery and the  morning of surgery.  2.  If you choose to wash your hair, wash your hair first as usual with your normal  shampoo.  3.  After you shampoo, rinse your hair and body thoroughly to remove the shampoo.                             4.  Use CHG as you would any other liquid soap.  You can apply chg directly to the skin and wash.  Gently with a scrungie or clean  washcloth.  5.  Apply the CHG Soap to your body ONLY FROM THE NECK DOWN.   Do   not use on face/ open                           Wound or open sores. Avoid contact with eyes, ears mouth and   genitals (private parts).                       Wash face,  Genitals (private parts) with your normal soap.             6.  Wash thoroughly, paying special attention to the area where your    surgery  will be performed.  7.  Thoroughly rinse your body with warm  water from the neck down.  8.  DO NOT shower/wash with your normal soap after using and rinsing off the CHG Soap.                9.  Pat yourself dry with a clean towel.            10.  Wear clean pajamas.            11.  Place clean sheets on your bed the night of your first shower and do not  sleep with pets. Day of Surgery : Do not apply any lotions/deodorants the morning of surgery.  Please wear clean clothes to the hospital/surgery center.  FAILURE TO FOLLOW THESE INSTRUCTIONS MAY RESULT IN THE CANCELLATION OF YOUR SURGERY  PATIENT SIGNATURE_________________________________  NURSE SIGNATURE__________________________________  ________________________________________________________________________

## 2023-03-16 NOTE — Progress Notes (Signed)
COVID Vaccine Completed:  Date of COVID positive in last 90 days:  PCP - Nira Conn, MD Cardiologist - Thurmon Fair, MD  Chest x-ray - 02-20-23 Epic EKG - 02-20-23 Epic Stress Test -  ECHO - 02-09-23 Epic Cardiac Cath -  Pacemaker/ICD device last checked: Spinal Cord Stimulator:  Bowel Prep -   Sleep Study -  CPAP -   Fasting Blood Sugar -  Checks Blood Sugar _____ times a day  Last dose of GLP1 agonist-  N/A GLP1 instructions:  N/A   Last dose of SGLT-2 inhibitors-  N/A SGLT-2 instructions: N/A   Blood Thinner Instructions:  Lovenox ? Coumadin Aspirin Instructions: Last Dose:  Activity level:  Can go up a flight of stairs and perform activities of daily living without stopping and without symptoms of chest pain or shortness of breath.  Able to exercise without symptoms  Unable to go up a flight of stairs without symptoms of     Anesthesia review:  Cardiomyopathy, CKD, HTN, DM  Patient denies shortness of breath, fever, cough and chest pain at PAT appointment  Patient verbalized understanding of instructions that were given to them at the PAT appointment. Patient was also instructed that they will need to review over the PAT instructions again at home before surgery.

## 2023-03-20 ENCOUNTER — Other Ambulatory Visit: Payer: Self-pay

## 2023-03-20 ENCOUNTER — Encounter (HOSPITAL_COMMUNITY)
Admission: RE | Admit: 2023-03-20 | Discharge: 2023-03-20 | Disposition: A | Discharge status: Home / Self Care | Payer: 59 | Source: Ambulatory Visit | Attending: Orthopaedic Surgery | Admitting: Orthopaedic Surgery

## 2023-03-20 ENCOUNTER — Encounter (HOSPITAL_COMMUNITY): Payer: Self-pay

## 2023-03-20 VITALS — BP 106/64 | HR 70 | Temp 98.9°F | Resp 20 | Ht 61.0 in | Wt 140.0 lb

## 2023-03-20 DIAGNOSIS — Z794 Long term (current) use of insulin: Secondary | ICD-10-CM | POA: Diagnosis not present

## 2023-03-20 DIAGNOSIS — E119 Type 2 diabetes mellitus without complications: Secondary | ICD-10-CM | POA: Insufficient documentation

## 2023-03-20 DIAGNOSIS — C541 Malignant neoplasm of endometrium: Secondary | ICD-10-CM | POA: Diagnosis not present

## 2023-03-20 DIAGNOSIS — Z7901 Long term (current) use of anticoagulants: Secondary | ICD-10-CM | POA: Diagnosis not present

## 2023-03-20 DIAGNOSIS — Z01812 Encounter for preprocedural laboratory examination: Secondary | ICD-10-CM | POA: Diagnosis not present

## 2023-03-20 HISTORY — DX: Cerebral infarction, unspecified: I63.9

## 2023-03-20 HISTORY — DX: Depression, unspecified: F32.A

## 2023-03-20 LAB — PROTIME-INR
INR: 1.3 — ABNORMAL HIGH (ref 0.8–1.2)
Prothrombin Time: 16.3 seconds — ABNORMAL HIGH (ref 11.4–15.2)

## 2023-03-20 LAB — HEMOGLOBIN A1C
Hgb A1c MFr Bld: 7.7 % — ABNORMAL HIGH (ref 4.8–5.6)
Mean Plasma Glucose: 174.29 mg/dL

## 2023-03-20 LAB — GLUCOSE, CAPILLARY: Glucose-Capillary: 186 mg/dL — ABNORMAL HIGH (ref 70–99)

## 2023-03-20 LAB — APTT: aPTT: 38 seconds — ABNORMAL HIGH (ref 24–36)

## 2023-03-21 ENCOUNTER — Encounter: Payer: Self-pay | Admitting: *Deleted

## 2023-03-21 ENCOUNTER — Encounter (HOSPITAL_COMMUNITY): Payer: Self-pay

## 2023-03-21 ENCOUNTER — Encounter (HOSPITAL_COMMUNITY): Payer: Self-pay | Admitting: Anesthesiology

## 2023-03-21 ENCOUNTER — Encounter (HOSPITAL_COMMUNITY): Payer: Self-pay | Admitting: Medical

## 2023-03-21 NOTE — Progress Notes (Signed)
Case: [id number] Date/Time: 03/27/23 0945   Procedure: RIGHT TIBIA INTRAMEDULLARY (IM) NAIL PLACEMENT (Right)   Anesthesia type: Choice   Pre-op diagnosis: right tibia lesionwith cortex erosion   Location: [facility] ROOM 06 / [facility]   Surgeons: Marcene Corning, MD       DISCUSSION: Jaime Gomez is a 57 yo female who presents to PAT prior to surgery above. Patient with complex PMH. Originally from [facility] where she was diagnosed with endometrial cancer. Moved here in Jan 2023. 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 Has undergone chemo and radiation in the past. 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  Admitted from 11/28/22-12/12/22 due to to sepsis possibly from infected port a cath site vs UTI. Blood cx positive for Pseudomonas. Improved on Cefepime and transitioned to Cipro. Hospital course complicated by AMS, AKI, new CVA, and new CHF.  She was seen by Neurology and Cardiology inpatient. Altered mental status was ultimately felt to be due to cefepime toxicity and mental status returned to baseline after this was discontinued. Her AKI resolved as well. Her EF was 30-35% on 12/04/2022 but quickly improved to 45-50% on repeat Echo on 12/11/2022.  Followed up with Cardiology on 01/01/23. Was stable from cardiac standpoint. She was not felt to be a good cardiac catheterization given AKI and altered mental status when she was in the hospital. Advised to f/u in 3 months.  She has not followed up with Neurology.  Underwent stent exchange at outpatient surgery center with Dr. Cardell Peach on 6/21 without complications.  Admitted from 7/18-7/20 for left sided flank/chest pain. Found to have increasing pulmonary mets. Also found to have DVT of the right common femoral,  profunda femoral, femoral and popliteal veins and left femoral and popliteal veins. Also noted a mass in the right upper calf. She was started on IV heparin for presumed pulmonary embolism. Nephrology consulted due to worsening kidney function. Patient declining dialysis at this time. Nephrology made several med recommendations: "Avoid nephrotoxic medications including NSAIDs and iodinated intravenous contrast exposure unless the latter is absolutely indicated.  Preferred narcotic agents for pain control are hydromorphone, fentanyl, and methadone. Morphine should not be used. Avoid Baclofen and avoid oral sodium phosphate and magnesium citrate based laxatives / bowel preps." Patient was transitioned to therapeutic Lovenox and discharged. An echo was repeated while inpatient and EF completely recovered to 55-60%.  Saw Radiation oncology on 8/19 who reviewed imaging of tibial mass significant erosion of the adjacent tibial cortex. Orthopedics consult recommended. Now scheduled for IM nail to stabilize area prior to having any radiation of this.  Discussed case with Glade Stanford. He recommends Neurology clearance due to CVA in May.     VS: BP 106/64   Pulse 70   Temp 37.2 C (Oral)   Resp 20   Ht 5\' 1"  (1.549 m)   Wt 63.5 kg   SpO2 98%   BMI 26.45 kg/m   PROVIDERS: Karie Georges, MD Oncology: Dr. Myna Hidalgo Rad Onc: Antony Blackbird, MD Cardiology: [facility] Nephrology: Thedore Mins with [facility]  LABS: Labs reviewed: Acceptable for surgery. (all labs ordered are listed, but only abnormal results are displayed)  Labs Reviewed  HEMOGLOBIN A1C - Abnormal; Notable for the following components:      Result Value   Hgb A1c MFr Bld 7.7 (*)  All other components within normal limits  GLUCOSE, CAPILLARY - Abnormal; Notable for the following components:   Glucose-Capillary 186 (*)    All other components within normal limits  PROTIME-INR - Abnormal; Notable for the following components:   Prothrombin Time  16.3 (*)    INR 1.3 (*)    All other components within normal limits  APTT - Abnormal; Notable for the following components:   aPTT 38 (*)    All other components within normal limits     IMAGES:  MRI tib/fib 02/09/23:  IMPRESSION: 1. Complex subperiosteal mass along the posteromedial aspect of the proximal right tibial metaphysis, corresponding with the mass seen on recent ultrasound. Contrast was not administered for this examination, although internal vascularity was noted on ultrasound, implying a solid lesion. There is resulting erosion of the adjacent tibial cortex with adjacent marrow edema and periosteal edema. Findings are suspicious for metastatic disease in this patient with a history of endometrial carcinoma. Differential includes infection with a subperiosteal abscess and adjacent tibial osteomyelitis. Consider tissue sampling. 2. No other soft tissue masses or fluid collections identified. 3. Nonspecific edema within the deep posterior compartment of the right lower leg. 4. Known DVT is not well demonstrated by this examination.  CT Chest 02/08/23:  IMPRESSION: 1. Left perihilar masses is increased in size when compared with August 21, 2022 PET-CT, findings are concerning for progressive disease. See recently performed PET-CT dated February 07, 2023 for further discussion. 2. Cystic lesion of the right lower lobe with nodular components, nodules are increased size when compared with portions of the lung visualized on January 02, 2023 abdomen and pelvis CT. 3. Additional new bilateral solid pulmonary nodules are seen when compared with PET-CT dated August 21, 2022. 4. No enlarging lymph nodes seen in the chest. 5. Aortic Atherosclerosis (ICD10-I70.0).  CT Renal 02/09/23:   IMPRESSION: 1. Partially imaged masslike consolidation involving the left hilum and right lower lobe are new from 01/02/2023 and may represent infection/inflammation or metastatic disease. 2.  Medial right lower lobe nodule measuring 1.0 x 0.8 cm, previously 0.6 x 0.5 cm. Slightly inferiorly, a 6 mm subsolid nodule is new. Previously noted irregular/clustered nodule in the medial left lower lobe is no longer seen. 3. Bilateral ureteral stents and percutaneous left nephrostomy tube in-situ, similar to IR procedure dated 01/09/2023. Similar right urothelial thickening and mild pelvic fullness. 4. Similar rectal distention containing large volume stool. Small to moderate volume stool within the remainder of the colon. Fecalization of material within the terminal ileum likely reflecting slowed transit. 5. Aortic Atherosclerosis (ICD10-I70.0). Coronary artery calcifications. Assessment for potential risk factor modification, dietary therapy or pharmacologic therapy may be warranted, if clinically indicated. IMPRESSION: 1. Partially imaged masslike consolidation involving the left hilum and right lower lobe are new from 01/02/2023 and may represent infection/inflammation or metastatic disease. 2. Medial right lower lobe nodule measuring 1.0 x 0.8 cm, previously 0.6 x 0.5 cm. Slightly inferiorly, a 6 mm subsolid nodule is new. Previously noted irregular/clustered nodule in the medial left lower lobe is no longer seen. 3. Bilateral ureteral stents and percutaneous left nephrostomy tube in-situ, similar to IR procedure dated 01/09/2023. Similar right urothelial thickening and mild pelvic fullness. 4. Similar rectal distention containing large volume stool. Small to moderate volume stool within the remainder of the colon. Fecalization of material within the terminal ileum likely reflecting slowed transit. 5. Aortic Atherosclerosis (ICD10-I70.0). Coronary artery calcifications. Assessment for potential risk factor modification, dietary therapy or pharmacologic therapy may be warranted,  if clinically indicated.  MRI Brain 12/04/22:  IMPRESSION: 1. Acute or early subacute left  frontoparietal infarcts with additional punctate acute infarcts in the cerebellum and bilateral occipital lobes. Given involvement of multiple vascular territories, consider an embolic etiology. 2. No significant mass effect.   EKG 02/20/23  SR Low voltage Diffuse T wave abnormality, new since last tracing   CV:  Echo 02/09/23:  IMPRESSIONS     1. Left ventricular ejection fraction, by estimation, is 55 to 60%. The  left ventricle has normal function. The left ventricle has no regional  wall motion abnormalities. Left ventricular diastolic parameters are  indeterminate.   2. Right ventricular systolic function is normal. The right ventricular  size is normal.   3. The mitral valve is normal in structure. Trivial mitral valve  regurgitation. No evidence of mitral stenosis.   4. The aortic valve is normal in structure. Aortic valve regurgitation is  not visualized. No aortic stenosis is present.   5. The inferior vena cava is normal in size with greater than 50%  respiratory variability, suggesting right atrial pressure of 3 mmHg.   Conclusion(s)/Recommendation(s): LVEF has normalized since previous study.   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/2021   Ovarian cancer (HCC) 2013   Pneumonia 12/03/2022   Stroke (HCC)    Wears glasses     Past Surgical History:  Procedure Laterality Date   ABDOMINAL HYSTERECTOMY     BOTOX INJECTION N/A 01/12/2023   Procedure: BOTOX INJECTION INTO BLADDER;  Surgeon: Jannifer Hick, MD;  Location: [facility];  Service: Urology;  Laterality: N/A;    CYSTOSCOPY W/ RETROGRADES Right 01/12/2023   Procedure: CYSTOSCOPY WITH RIGHT  RETROGRADE PYELOGRAM/ RIGHT URETERAL STENT CHANGE;  Surgeon: Jannifer Hick, MD;  Location: [facility];  Service: Urology;  Laterality: Right;   CYSTOSCOPY W/ URETERAL STENT PLACEMENT Bilateral 09/01/2021   Procedure: CYSTOSCOPY WITH RETROGRADE PYELOGRAM/URETERAL STENT PLACEMENT;  Surgeon: Jannifer Hick, MD;  Location: [facility];  Service: Urology;  Laterality: Bilateral;   CYSTOSCOPY WITH FULGERATION N/A 01/12/2023   Procedure: CYSTOSCOPY WITH FULGERATION;  Surgeon: Jannifer Hick, MD;  Location: [facility];  Service: Urology;  Laterality: N/A;   CYSTOSCOPY WITH STENT PLACEMENT Right 05/19/2022   Procedure: CYSTOSCOPY WITH STENT CHANGE;  Surgeon: Jannifer Hick, MD;  Location: [facility];  Service: Urology;  Laterality: Right;  ONLY NEEDS 30 MIN   HERNIA REPAIR     IR CONVERT LEFT NEPHROSTOMY TO NEPHROURETERAL CATH  11/02/2021   IR CONVERT LEFT NEPHROSTOMY TO NEPHROURETERAL CATH  01/09/2023   IR CV LINE INJECTION  08/23/2021   IR DIL URETER LEFT  01/09/2023   IR IMAGING GUIDED PORT INSERTION  11/14/2021   IR IMAGING GUIDED PORT INSERTION  01/09/2023   IR NEPHROSTOMY EXCHANGE LEFT  11/02/2021   IR NEPHROSTOMY EXCHANGE LEFT  01/20/2022   IR NEPHROSTOMY EXCHANGE LEFT  11/29/2022   IR NEPHROSTOMY PLACEMENT LEFT  09/03/2021   IR REMOVAL TUN ACCESS W/ PORT W/O FL MOD SED  11/14/2021   IR REMOVAL TUN  ACCESS W/ PORT W/O FL MOD SED  12/04/2022   IR URETERAL STENT PLACEMENT EXISTING ACCESS LEFT  02/28/2023    MEDICATIONS:  acetaminophen (TYLENOL) 500 MG tablet   amLODipine (NORVASC) 2.5 MG tablet   carvedilol (COREG) 12.5 MG tablet   Continuous Glucose Sensor (FREESTYLE LIBRE 3 SENSOR) MISC   dexamethasone (DECADRON) 4 MG tablet   enoxaparin (LOVENOX) 60 MG/0.6ML injection   famotidine (PEPCID) 20 MG tablet   furosemide (LASIX) 20 MG tablet   gabapentin (NEURONTIN) 300 MG capsule   insulin aspart  (NOVOLOG) 100 UNIT/ML injection   insulin detemir (LEVEMIR) 100 UNIT/ML injection   lidocaine (LIDODERM) 5 %   oxybutynin (DITROPAN-XL) 10 MG 24 hr tablet   oxycodone (OXY-IR) 5 MG capsule   polyethylene glycol (MIRALAX / GLYCOLAX) 17 g packet   prochlorperazine (COMPAZINE) 10 MG tablet   sodium bicarbonate 650 MG tablet   venlafaxine XR (EFFEXOR XR) 37.5 MG 24 hr capsule   No current facility-administered medications for this encounter.    0.9 %  sodium chloride infusion   Marcille Blanco [facility] Short Stay/Anesthesiology [facility] Phone [phone] 03/27/2023 8:32 AM

## 2023-03-21 NOTE — Progress Notes (Signed)
[facility] results received and given to Dr Myna Hidalgo.  Results scanned.

## 2023-03-22 ENCOUNTER — Other Ambulatory Visit: Payer: Self-pay | Admitting: Hematology & Oncology

## 2023-03-23 ENCOUNTER — Other Ambulatory Visit: Payer: Self-pay

## 2023-03-23 ENCOUNTER — Inpatient Hospital Stay (HOSPITAL_BASED_OUTPATIENT_CLINIC_OR_DEPARTMENT_OTHER): Payer: 59 | Admitting: Hematology & Oncology

## 2023-03-23 ENCOUNTER — Emergency Department (HOSPITAL_BASED_OUTPATIENT_CLINIC_OR_DEPARTMENT_OTHER): Payer: 59

## 2023-03-23 ENCOUNTER — Other Ambulatory Visit: Payer: Self-pay | Admitting: *Deleted

## 2023-03-23 ENCOUNTER — Inpatient Hospital Stay: Payer: 59

## 2023-03-23 ENCOUNTER — Emergency Department (HOSPITAL_BASED_OUTPATIENT_CLINIC_OR_DEPARTMENT_OTHER)
Admission: EM | Admit: 2023-03-23 | Discharge: 2023-03-23 | Disposition: A | Discharge status: Home / Self Care | Payer: 59 | Attending: Emergency Medicine | Admitting: Emergency Medicine

## 2023-03-23 ENCOUNTER — Encounter (HOSPITAL_BASED_OUTPATIENT_CLINIC_OR_DEPARTMENT_OTHER): Payer: Self-pay

## 2023-03-23 ENCOUNTER — Encounter: Payer: Self-pay | Admitting: Hematology & Oncology

## 2023-03-23 VITALS — BP 94/58 | HR 90 | Temp 98.8°F | Resp 19 | Wt 143.0 lb

## 2023-03-23 DIAGNOSIS — I120 Hypertensive chronic kidney disease with stage 5 chronic kidney disease or end stage renal disease: Secondary | ICD-10-CM | POA: Insufficient documentation

## 2023-03-23 DIAGNOSIS — R2241 Localized swelling, mass and lump, right lower limb: Secondary | ICD-10-CM

## 2023-03-23 DIAGNOSIS — C530 Malignant neoplasm of endocervix: Secondary | ICD-10-CM

## 2023-03-23 DIAGNOSIS — D509 Iron deficiency anemia, unspecified: Secondary | ICD-10-CM

## 2023-03-23 DIAGNOSIS — N1831 Chronic kidney disease, stage 3a: Secondary | ICD-10-CM

## 2023-03-23 DIAGNOSIS — Z85118 Personal history of other malignant neoplasm of bronchus and lung: Secondary | ICD-10-CM | POA: Insufficient documentation

## 2023-03-23 DIAGNOSIS — Z1152 Encounter for screening for COVID-19: Secondary | ICD-10-CM | POA: Insufficient documentation

## 2023-03-23 DIAGNOSIS — C3490 Malignant neoplasm of unspecified part of unspecified bronchus or lung: Secondary | ICD-10-CM | POA: Diagnosis not present

## 2023-03-23 DIAGNOSIS — Z794 Long term (current) use of insulin: Secondary | ICD-10-CM | POA: Diagnosis not present

## 2023-03-23 DIAGNOSIS — D631 Anemia in chronic kidney disease: Secondary | ICD-10-CM | POA: Diagnosis not present

## 2023-03-23 DIAGNOSIS — Z79899 Other long term (current) drug therapy: Secondary | ICD-10-CM | POA: Insufficient documentation

## 2023-03-23 DIAGNOSIS — R0902 Hypoxemia: Secondary | ICD-10-CM

## 2023-03-23 DIAGNOSIS — Z515 Encounter for palliative care: Secondary | ICD-10-CM

## 2023-03-23 DIAGNOSIS — N1330 Unspecified hydronephrosis: Secondary | ICD-10-CM | POA: Diagnosis not present

## 2023-03-23 DIAGNOSIS — C541 Malignant neoplasm of endometrium: Secondary | ICD-10-CM

## 2023-03-23 DIAGNOSIS — N390 Urinary tract infection, site not specified: Secondary | ICD-10-CM | POA: Diagnosis not present

## 2023-03-23 DIAGNOSIS — C7951 Secondary malignant neoplasm of bone: Secondary | ICD-10-CM | POA: Diagnosis not present

## 2023-03-23 DIAGNOSIS — N189 Chronic kidney disease, unspecified: Secondary | ICD-10-CM | POA: Diagnosis not present

## 2023-03-23 DIAGNOSIS — N185 Chronic kidney disease, stage 5: Secondary | ICD-10-CM | POA: Diagnosis not present

## 2023-03-23 DIAGNOSIS — I129 Hypertensive chronic kidney disease with stage 1 through stage 4 chronic kidney disease, or unspecified chronic kidney disease: Secondary | ICD-10-CM | POA: Diagnosis not present

## 2023-03-23 DIAGNOSIS — E1122 Type 2 diabetes mellitus with diabetic chronic kidney disease: Secondary | ICD-10-CM | POA: Diagnosis not present

## 2023-03-23 DIAGNOSIS — R0602 Shortness of breath: Secondary | ICD-10-CM | POA: Diagnosis not present

## 2023-03-23 DIAGNOSIS — E876 Hypokalemia: Secondary | ICD-10-CM | POA: Diagnosis not present

## 2023-03-23 LAB — URINALYSIS, W/ REFLEX TO CULTURE (INFECTION SUSPECTED)
Bilirubin Urine: NEGATIVE
Glucose, UA: NEGATIVE mg/dL
Ketones, ur: NEGATIVE mg/dL
Nitrite: POSITIVE — AB
Protein, ur: >=300 mg/dL — AB
Specific Gravity, Urine: 1.025 (ref 1.005–1.030)
WBC, UA: >50 WBC/hpf (ref 0–5)
pH: 5 (ref 5.0–8.0)

## 2023-03-23 LAB — CBC WITH DIFFERENTIAL/PLATELET
Abs Immature Granulocytes: 0.09 10*3/uL — ABNORMAL HIGH (ref 0.00–0.07)
Basophils Absolute: 0 10*3/uL (ref 0.0–0.1)
Basophils Relative: 0 %
Eosinophils Absolute: 0.2 10*3/uL (ref 0.0–0.5)
Eosinophils Relative: 3 %
HCT: 23.4 % — ABNORMAL LOW (ref 36.0–46.0)
Hemoglobin: 7.6 g/dL — ABNORMAL LOW (ref 12.0–15.0)
Immature Granulocytes: 2 %
Lymphocytes Relative: 10 %
Lymphs Abs: 0.5 10*3/uL — ABNORMAL LOW (ref 0.7–4.0)
MCH: 30.3 pg (ref 26.0–34.0)
MCHC: 32.5 g/dL (ref 30.0–36.0)
MCV: 93.2 fL (ref 80.0–100.0)
Monocytes Absolute: 0.3 10*3/uL (ref 0.1–1.0)
Monocytes Relative: 6 %
Neutro Abs: 4.1 10*3/uL (ref 1.7–7.7)
Neutrophils Relative %: 79 %
Platelets: 198 10*3/uL (ref 150–400)
RBC: 2.51 MIL/uL — ABNORMAL LOW (ref 3.87–5.11)
RDW: 14.8 % (ref 11.5–15.5)
WBC: 5.2 10*3/uL (ref 4.0–10.5)
nRBC: 0 % (ref 0.0–0.2)

## 2023-03-23 LAB — COMPREHENSIVE METABOLIC PANEL
ALT: 9 U/L (ref 0–44)
AST: 13 U/L — ABNORMAL LOW (ref 15–41)
Albumin: 2.4 g/dL — ABNORMAL LOW (ref 3.5–5.0)
Alkaline Phosphatase: 122 U/L (ref 38–126)
Anion gap: 13 (ref 5–15)
BUN: 90 mg/dL — ABNORMAL HIGH (ref 6–20)
CO2: 14 mmol/L — ABNORMAL LOW (ref 22–32)
Calcium: 8.3 mg/dL — ABNORMAL LOW (ref 8.9–10.3)
Chloride: 104 mmol/L (ref 98–111)
Creatinine, Ser: 4.97 mg/dL — ABNORMAL HIGH (ref 0.44–1.00)
GFR, Estimated: 10 mL/min — ABNORMAL LOW (ref >60)
Glucose, Bld: 228 mg/dL — ABNORMAL HIGH (ref 70–99)
Potassium: 5.2 mmol/L — ABNORMAL HIGH (ref 3.5–5.1)
Sodium: 131 mmol/L — ABNORMAL LOW (ref 135–145)
Total Bilirubin: 0.4 mg/dL (ref 0.3–1.2)
Total Protein: 6.7 g/dL (ref 6.5–8.1)

## 2023-03-23 LAB — TROPONIN I (HIGH SENSITIVITY)
Troponin I (High Sensitivity): 24 ng/L — ABNORMAL HIGH (ref <18)
Troponin I (High Sensitivity): 27 ng/L — ABNORMAL HIGH (ref <18)

## 2023-03-23 LAB — SARS CORONAVIRUS 2 BY RT PCR: SARS Coronavirus 2 by RT PCR: NEGATIVE

## 2023-03-23 LAB — BRAIN NATRIURETIC PEPTIDE: B Natriuretic Peptide: 559 pg/mL — ABNORMAL HIGH (ref 0.0–100.0)

## 2023-03-23 LAB — SAMPLE TO BLOOD BANK

## 2023-03-23 LAB — PREPARE RBC (CROSSMATCH)

## 2023-03-23 LAB — LACTIC ACID, PLASMA: Lactic Acid, Venous: 1.6 mmol/L (ref 0.5–1.9)

## 2023-03-23 MED ORDER — SODIUM BICARBONATE 650 MG PO TABS
650.0000 mg | ORAL_TABLET | Freq: Once | ORAL | Status: AC
  Administered 2023-03-23: 650 mg via ORAL
  Filled 2023-03-23: qty 1

## 2023-03-23 MED ORDER — SODIUM CHLORIDE 0.9% FLUSH
10.0000 mL | Freq: Once | INTRAVENOUS | Status: AC
  Administered 2023-03-23: 10 mL via INTRAVENOUS

## 2023-03-23 MED ORDER — SODIUM CHLORIDE 0.9 % IV SOLN
2.0000 g | Freq: Once | INTRAVENOUS | Status: DC
  Filled 2023-03-23: qty 12.5

## 2023-03-23 MED ORDER — SODIUM CHLORIDE 0.9 % IV SOLN
2.0000 g | Freq: Once | INTRAVENOUS | Status: AC
  Administered 2023-03-23: 2 g via INTRAVENOUS
  Filled 2023-03-23: qty 12.5

## 2023-03-23 MED ORDER — FUROSEMIDE 10 MG/ML IJ SOLN
20.0000 mg | Freq: Once | INTRAMUSCULAR | Status: DC
  Filled 2023-03-23: qty 4

## 2023-03-23 MED ORDER — SODIUM ZIRCONIUM CYCLOSILICATE 10 G PO PACK
10.0000 g | PACK | Freq: Once | ORAL | Status: AC
  Administered 2023-03-23: 10 g via ORAL
  Filled 2023-03-23: qty 1

## 2023-03-23 MED ORDER — ACETAMINOPHEN 325 MG PO TABS
650.0000 mg | ORAL_TABLET | Freq: Once | ORAL | Status: AC
  Administered 2023-03-23: 650 mg via ORAL
  Filled 2023-03-23: qty 2

## 2023-03-23 MED ORDER — SODIUM CHLORIDE 0.9 % IV SOLN: 2.0000 g | Freq: Two times a day (BID) | INTRAVENOUS | Status: DC

## 2023-03-23 MED ORDER — CEFDINIR 300 MG PO CAPS: 300.0000 mg | ORAL_CAPSULE | Freq: Every day | ORAL | 0 refills | Status: DC

## 2023-03-23 MED ORDER — DIPHENHYDRAMINE HCL 25 MG PO CAPS
25.0000 mg | ORAL_CAPSULE | Freq: Once | ORAL | Status: AC
  Administered 2023-03-23: 25 mg via ORAL
  Filled 2023-03-23: qty 1

## 2023-03-23 NOTE — Patient Instructions (Signed)

## 2023-03-23 NOTE — H&P (Signed)
Jaime Gomez is an 57 y.o. female.   Chief Complaint: Right tibia pain HPI: Jaime Gomez is here today for evaluation of her right leg.  She is sent here today by Dr. Arlan Organ.  Unfortunately she has metastatic cancer and has a lesion that was found in her tibia.  This tibial lesion is now eating through the cortex of the bone.  She is using a walker to get around as she has had some continued pain.  She is here today to discuss prophylactic IM nailing of the tibia to prevent fracture.    Radiographs:  X-rays that were ordered, performed, and interpreted by me today included 2 views of the right tib-fib show evidence of metastatic lesion eating through the posterior cortex of the tibia near the middle third line.  No other acute abnormalities are noted.  MRI:  I reviewed an MRI scan films and report of a study done at [facility] from 02/09/23 of the right tibia demonstrates a metastatic lesion eroding through the tibia near the proximal third.  There are no other acute abnormalities noted.  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/2021   Ovarian cancer (HCC) 2013   Pneumonia 12/03/2022   Stroke (HCC)    Wears glasses     Past Surgical History:  Procedure Laterality Date   ABDOMINAL HYSTERECTOMY     BOTOX INJECTION N/A 01/12/2023   Procedure: BOTOX INJECTION INTO BLADDER;  Surgeon: Jannifer Hick, MD;  Location: [facility];  Service: Urology;  Laterality: N/A;   CYSTOSCOPY W/ RETROGRADES Right 01/12/2023   Procedure: CYSTOSCOPY WITH RIGHT  RETROGRADE PYELOGRAM/ RIGHT URETERAL STENT CHANGE;   Surgeon: Jannifer Hick, MD;  Location: [facility];  Service: Urology;  Laterality: Right;   CYSTOSCOPY W/ URETERAL STENT PLACEMENT Bilateral 09/01/2021   Procedure: CYSTOSCOPY WITH RETROGRADE PYELOGRAM/URETERAL STENT PLACEMENT;  Surgeon: Jannifer Hick, MD;  Location: [facility];  Service: Urology;  Laterality: Bilateral;   CYSTOSCOPY WITH FULGERATION N/A 01/12/2023   Procedure: CYSTOSCOPY WITH FULGERATION;  Surgeon: Jannifer Hick, MD;  Location: [facility];  Service: Urology;  Laterality: N/A;   CYSTOSCOPY WITH STENT PLACEMENT Right 05/19/2022   Procedure: CYSTOSCOPY WITH STENT CHANGE;  Surgeon: Jannifer Hick, MD;  Location: [facility];  Service: Urology;  Laterality: Right;  ONLY NEEDS 30 MIN   HERNIA REPAIR     IR CONVERT LEFT NEPHROSTOMY TO NEPHROURETERAL CATH  11/02/2021   IR CONVERT LEFT NEPHROSTOMY TO NEPHROURETERAL CATH  01/09/2023   IR CV LINE INJECTION  08/23/2021   IR DIL URETER LEFT  01/09/2023   IR IMAGING GUIDED PORT INSERTION  11/14/2021   IR IMAGING GUIDED PORT INSERTION  01/09/2023   IR NEPHROSTOMY EXCHANGE LEFT  11/02/2021   IR NEPHROSTOMY EXCHANGE LEFT  01/20/2022   IR NEPHROSTOMY EXCHANGE LEFT  11/29/2022   IR NEPHROSTOMY PLACEMENT LEFT  09/03/2021   IR REMOVAL TUN ACCESS W/ PORT W/O FL MOD SED  11/14/2021   IR REMOVAL TUN ACCESS W/ PORT W/O FL MOD SED  12/04/2022   IR URETERAL  STENT PLACEMENT EXISTING ACCESS LEFT  02/28/2023    Family History  Problem Relation Age of Onset   Hyperlipidemia Mother    Heart disease Mother    Diabetes Mother    Depression Mother    COPD Mother    Arthritis Mother    Heart attack Mother    Drug abuse Father    Alcohol abuse Father    Hyperlipidemia Sister    Heart disease Sister    Diabetes Sister    Arthritis Sister    Miscarriages / [place] Sister    Depression Brother    Social History:  reports that she has never smoked. She has never used smokeless tobacco. She reports that she does not drink alcohol and  does not use drugs.  Allergies:  Allergies  Allergen Reactions   Paclitaxel Anaphylaxis, Swelling and Other (See Comments)    Made everything swell   Taxotere [Docetaxel] Anaphylaxis, Swelling and Other (See Comments)    Throat swelling    No medications prior to admission.    Results for orders placed or performed during the hospital encounter of 03/23/23 (from the past 48 hour(s))  CBC with Differential     Status: Abnormal   Collection Time: 03/23/23  7:25 AM  Result Value Ref Range   WBC 5.2 4.0 - 10.5 K/uL   RBC 2.51 (L) 3.87 - 5.11 MIL/uL   Hemoglobin 7.6 (L) 12.0 - 15.0 g/dL   HCT 29.5 (L) 28.4 - 13.2 %   MCV 93.2 80.0 - 100.0 fL   MCH 30.3 26.0 - 34.0 pg   MCHC 32.5 30.0 - 36.0 g/dL   RDW 44.0 10.2 - 72.5 %   Platelets 198 150 - 400 K/uL   nRBC 0.0 0.0 - 0.2 %   Neutrophils Relative % 79 %   Neutro Abs 4.1 1.7 - 7.7 K/uL   Lymphocytes Relative 10 %   Lymphs Abs 0.5 (L) 0.7 - 4.0 K/uL   Monocytes Relative 6 %   Monocytes Absolute 0.3 0.1 - 1.0 K/uL   Eosinophils Relative 3 %   Eosinophils Absolute 0.2 0.0 - 0.5 K/uL   Basophils Relative 0 %   Basophils Absolute 0.0 0.0 - 0.1 K/uL   Immature Granulocytes 2 %   Abs Immature Granulocytes 0.09 (H) 0.00 - 0.07 K/uL    Comment: Performed at [facility], [address] [place], [place] [address]  Comprehensive metabolic panel     Status: Abnormal   Collection Time: 03/23/23  7:25 AM  Result Value Ref Range   Sodium 131 (L) 135 - 145 mmol/L   Potassium 5.2 (H) 3.5 - 5.1 mmol/L   Chloride 104 98 - 111 mmol/L   CO2 14 (L) 22 - 32 mmol/L   Glucose, Bld 228 (H) 70 - 99 mg/dL    Comment: Glucose reference range applies only to samples taken after fasting for at least 8 hours.   BUN 90 (H) 6 - 20 mg/dL   Creatinine, Ser 0.34 (H) 0.44 - 1.00 mg/dL   Calcium 8.3 (L) 8.9 - 10.3 mg/dL   Total Protein 6.7 6.5 - 8.1 g/dL   Albumin 2.4 (L) 3.5 - 5.0 g/dL   AST 13 (L) 15 - 41 U/L   ALT 9 0 - 44 U/L   Alkaline  Phosphatase 122 38 - 126 U/L   Total Bilirubin 0.4 0.3 - 1.2 mg/dL   GFR, Estimated 10 (L) >60 mL/min    Comment: (NOTE) Calculated using the CKD-EPI  Creatinine Equation (2021)    Anion gap 13 5 - 15    Comment: Performed at [facility], [address] [place], [place] [address]  Troponin I (High Sensitivity)     Status: Abnormal   Collection Time: 03/23/23  7:25 AM  Result Value Ref Range   Troponin I (High Sensitivity) 24 (H) <18 ng/L    Comment: (NOTE) Elevated high sensitivity troponin I (hsTnI) values and significant  changes across serial measurements may suggest ACS but many other  chronic and acute conditions are known to elevate hsTnI results.  Refer to the "Links" section for chest pain algorithms and additional  guidance. Performed at [facility], [address] [place], [place] [address]   Brain natriuretic peptide     Status: Abnormal   Collection Time: 03/23/23  7:25 AM  Result Value Ref Range   B Natriuretic Peptide 559.0 (H) 0.0 - 100.0 pg/mL    Comment: Performed at [facility], [address] [place], [place] [address]  SARS Coronavirus 2 by RT PCR (hospital order, performed in [facility] hospital lab) *cepheid single result test* Anterior Nasal Swab     Status: None   Collection Time: 03/23/23  7:26 AM   Specimen: Anterior Nasal Swab  Result Value Ref Range   SARS Coronavirus 2 by RT PCR NEGATIVE NEGATIVE    Comment: (NOTE) SARS-CoV-2 target nucleic acids are NOT DETECTED.  The SARS-CoV-2 RNA is generally detectable in upper and lower respiratory specimens during the acute phase of infection. The lowest concentration of SARS-CoV-2 viral copies this assay can detect is 250 copies / mL. A negative result does not preclude SARS-CoV-2 infection and should not be used as the sole basis for treatment or other patient management decisions.  A negative result may occur with improper specimen collection / handling, submission  of specimen other than nasopharyngeal swab, presence of viral mutation(s) within the areas targeted by this assay, and inadequate number of viral copies (<250 copies / mL). A negative result must be combined with clinical observations, patient history, and epidemiological information.  Fact Sheet for Patients:   RoadLapTop.co.za  Fact Sheet for Healthcare Providers: http://kim-miller.com/  This test is not yet approved or  cleared by the [place] FDA and has been authorized for detection and/or diagnosis of SARS-CoV-2 by FDA under an Emergency Use Authorization (EUA).  This EUA will remain in effect (meaning this test can be used) for the duration of the COVID-19 declaration under Section 564(b)(1) of the Act, 21 U.S.C. section 360bbb-3(b)(1), unless the authorization is terminated or revoked sooner.  Performed at [facility], [address] [place], [place] [address]   Lactic acid, plasma     Status: None   Collection Time: 03/23/23  7:43 AM  Result Value Ref Range   Lactic Acid, Venous 1.6 0.5 - 1.9 mmol/L    Comment: Performed at [facility], [address] [place], [place] [address]   No results found.  Review of Systems  Musculoskeletal:  Positive for arthralgias.       Right leg pain  All other systems reviewed and are negative.   There were no vitals taken for this visit. Physical Exam Constitutional:      Appearance: Normal appearance.  HENT:     Head: Normocephalic and atraumatic.     Nose: Nose normal.     Mouth/Throat:     Pharynx: Oropharynx is clear.  Eyes:     Extraocular Movements: Extraocular movements intact.  Pulmonary:     Effort: Pulmonary effort is normal.  Abdominal:     Palpations: Abdomen is soft.  Musculoskeletal:     Cervical back: Normal range of motion.     Comments: Examination of the right leg shows no obvious swelling or deformity.  She has some tenderness  palpation at the proximal third of the tibia.  She is otherwise nontender palpation throughout.  She is ambulate with assistance of a walker.  She is neurovascularly intact distally.    Skin:    General: Skin is warm and dry.  Neurological:     Mental Status: She is alert and oriented to person, place, and time.  Psychiatric:        Mood and Affect: Mood normal.        Behavior: Behavior normal.        Thought Content: Thought content normal.        Judgment: Judgment normal.      Assessment/Plan Assessment:  Right tibia metastatic lesion with impending fracture.  Plan: I informed Jaime Gomez that she has a metastatic lesion that hs ate through the posterior cortex of her tibia.  The only solution at this point is a prophylactic IM nail of the tibia to help stabilize this lesion.  She will then continue to follow up with Dr. Myna Hidalgo in regards to radiation treatments postoperatively.  I reviewed the risk of anesthesia, infection, DVT, bleeding related to this.  She is in agreement and would like to proceed.  She would have to stop her Lovenox the day of surgery but we would resume it the day after.  We will try and get this scheduled quickly as she does have an impending fracture.  She can call with any questions or concerns in interim.  Ginger Organ Masayo Fera, PA-C 03/23/2023, 8:22 AM

## 2023-03-23 NOTE — Discharge Planning (Signed)
    Durable Medical Equipment  (From admission, onward)           Start     Ordered   03/23/23 1031  For home use only DME oxygen  Once       Question Answer Comment  Length of Need 6 Months   Oxygen delivery system Gas      03/23/23 1031

## 2023-03-23 NOTE — ED Notes (Signed)
ED Provider at bedside. 

## 2023-03-23 NOTE — ED Triage Notes (Signed)
Pt reports SOB and difficulty breathing for a few days. Reports cough and fever.

## 2023-03-23 NOTE — Progress Notes (Signed)
Hematology and Oncology Follow Up Visit  Jaime Gomez [id number] August 28, 1965 57 y.o. 03/23/2023   Principle Diagnosis:  Metastatic adenocarcinoma of the endometrium -- No Actionable mutations Chronic renal failure Recurrent thromboembolic disease   Current Therapy:        Afinitor/letrozole - DC'd Blood transfusion as needed Lovenox 60 mg SQ daily   Interim History:  Jaime Gomez is her today with her sister for an unscheduled visit.  She she was in the ER today.  She was having some problems with weakness.  She also has a urinary tract infection.  Her urine is quite "active.".  She has a lot of leukocytes.  She has blood.  Not surprising, her protein is quite high.  She had a CT scanning of the body done.  Not surprising, she had progression of her multifocal pulmonary metastasis.  She has the osseous metastasis.  She had mild to moderate right hydronephrosis.  She has a pigtail ureteral stent seen.  Unfortunately, her Ca-125 keeps going up.  It was last 550 about 2 weeks ago.  Her labs show sodium 131.  Potassium 5.2.  BUN of 90 creatinine 4.97.  Her blood sugar is 228.  Her albumin is 2.4.  Her low white cell count is 5.2.  Hemoglobin 7.6.  Platelet count 198,000.  We were able to get up and into the office.  We are going to transfuse her.  She did get some IV antibiotics in our office.  She will be on some oral antibiotics.  She is supposed to have surgery to repair her right tibia on 03/27/2023.  Unfortunately, with this urinary tract infection, I things can be very difficult for her to have surgery and when I spoke to the Orthopedic Surgeon, we decided that would be best to postpone her surgery a week.  I think this would be safest for her and for anesthesiology.  I did speak with Anesthesiology.  I totally understand their concern.  We will do our best to try to improve her status before she has surgery.  Jaime Gomez and her sister both realize that this is not going to be an easy  surgery.  She is hide high risk.  I understand the high risk nature.  She is on Lovenox.  We could certainly stop the Lovenox and should she be able to have a spinal anesthesia to try to help with surgery.  She does not feel short of breath right now.  She does have the anemia which I am sure is contributing to her shortness of breath.  She has had a decent appetite.  She may not be eating as much as she had once was.  She has had no diarrhea.  There has been a little bit of bleeding with her urine.  Overall, I would have to say that her performance as a prior ECOG 2.   Medications:  Allergies as of 03/23/2023       Reactions   Paclitaxel Anaphylaxis, Swelling, Other (See Comments)   Made everything swell   Taxotere [docetaxel] Anaphylaxis, Swelling, Other (See Comments)   Throat swelling        Medication List        Accurate as of March 23, 2023  2:10 PM. If you have any questions, ask your nurse or doctor.          acetaminophen 500 MG tablet Commonly known as: TYLENOL Take 500 mg by mouth every 6 (six) hours as needed for mild pain.  amLODipine 2.5 MG tablet Commonly known as: NORVASC Take 1 tablet (2.5 mg total) by mouth daily.   carvedilol 12.5 MG tablet Commonly known as: COREG TAKE 1 TABLET (12.5MG  TOTAL) BY MOUTH TWICE A DAY WITH MEALS   cefdinir 300 MG capsule Commonly known as: OMNICEF Take 1 capsule (300 mg total) by mouth daily for 7 days.   dexamethasone 4 MG tablet Commonly known as: DECADRON Take 1 tablet (4 mg total) by mouth daily.   enoxaparin 60 MG/0.6ML injection Commonly known as: LOVENOX Inject 0.6 mLs (60 mg total) into the skin daily.   famotidine 20 MG tablet Commonly known as: PEPCID Take 20 mg by mouth 2 (two) times daily as needed for heartburn or indigestion.   FreeStyle Libre 3 Sensor Misc 1 Act by Does not apply route daily. Place 1 sensor on the skin every 14 days. Use to check glucose continuously   furosemide 20 MG  tablet Commonly known as: LASIX TAKE 1 TABLET BY MOUTH EVERY DAY What changed:  when to take this reasons to take this   gabapentin 300 MG capsule Commonly known as: NEURONTIN Take 1 capsule (300 mg total) by mouth 3 (three) times daily.   insulin aspart 100 UNIT/ML injection Commonly known as: novoLOG Inject 0-10 Units into the skin 3 (three) times daily before meals.   insulin detemir 100 UNIT/ML injection Commonly known as: LEVEMIR Inject 0.2 mLs (20 Units total) into the skin 2 (two) times daily. What changed:  how much to take when to take this additional instructions   Levemir FlexPen 100 UNIT/ML FlexPen Generic drug: insulin detemir INJECT 0.2MLS (20 UNITS) INTO THE SKIN TWICE A DAY What changed: Another medication with the same name was changed. Make sure you understand how and when to take each.   lidocaine 5 % Commonly known as: LIDODERM Place 1 patch onto the skin daily. Remove & Discard patch within 12 hours or as directed by MD   oxybutynin 10 MG 24 hr tablet Commonly known as: DITROPAN-XL Take 1 tablet (10 mg total) by mouth daily.   oxycodone 5 MG capsule Commonly known as: OXY-IR Take 1 capsule (5 mg total) by mouth every 4 (four) hours as needed for pain.   polyethylene glycol 17 g packet Commonly known as: MIRALAX / GLYCOLAX Take 17 g by mouth daily as needed for mild constipation.   prochlorperazine 10 MG tablet Commonly known as: COMPAZINE Take 10 mg by mouth every 6 (six) hours as needed for nausea or vomiting.   sodium bicarbonate 650 MG tablet Take 1 tablet (650 mg total) by mouth 3 (three) times daily. What changed: when to take this   venlafaxine XR 37.5 MG 24 hr capsule Commonly known as: Effexor XR Take 1 capsule (37.5 mg total) by mouth daily with breakfast.        Allergies:  Allergies  Allergen Reactions   Paclitaxel Anaphylaxis, Swelling and Other (See Comments)    Made everything swell   Taxotere [Docetaxel] Anaphylaxis,  Swelling and Other (See Comments)    Throat swelling    Past Medical History, Surgical history, Social history, and Family History were reviewed and updated.  Review of Systems: Review of Systems  Constitutional: Negative.   HENT: Negative.    Eyes: Negative.   Respiratory: Negative.    Cardiovascular: Negative.   Gastrointestinal: Negative.   Genitourinary:  Positive for flank pain.  Musculoskeletal:  Positive for back pain.  Skin: Negative.   Neurological: Negative.   Endo/Heme/Allergies: Negative.  Psychiatric/Behavioral: Negative.     Marland Kitchen   Physical Exam:  weight is 143 lb (64.9 kg). Her oral temperature is 98.8 F (37.1 C). Her blood pressure is 94/58 (abnormal) and her pulse is 90. Her respiration is 19 and oxygen saturation is 100%.   Wt Readings from Last 3 Encounters:  03/23/23 143 lb (64.9 kg)  03/23/23 143 lb (64.9 kg)  03/23/23 135 lb (61.2 kg)    Physical Exam Vitals reviewed.  HENT:     Head: Normocephalic and atraumatic.  Eyes:     Pupils: Pupils are equal, round, and reactive to light.  Cardiovascular:     Rate and Rhythm: Normal rate and regular rhythm.     Heart sounds: Normal heart sounds.  Pulmonary:     Effort: Pulmonary effort is normal.     Breath sounds: Normal breath sounds.  Abdominal:     General: Bowel sounds are normal.     Palpations: Abdomen is soft.  Musculoskeletal:        General: No tenderness or deformity. Normal range of motion.     Cervical back: Normal range of motion.  Lymphadenopathy:     Cervical: No cervical adenopathy.  Skin:    General: Skin is warm and dry.     Findings: No erythema or rash.  Neurological:     Mental Status: She is alert and oriented to person, place, and time.  Psychiatric:        Behavior: Behavior normal.        Thought Content: Thought content normal.        Judgment: Judgment normal.      Lab Results  Component Value Date   WBC 5.2 03/23/2023   HGB 7.6 (L) 03/23/2023   HCT 23.4 (L)  03/23/2023   MCV 93.2 03/23/2023   PLT 198 03/23/2023   Lab Results  Component Value Date   FERRITIN 2,438 (H) 03/09/2023   IRON 57 03/09/2023   TIBC 213 (L) 03/09/2023   UIBC 156 03/09/2023   IRONPCTSAT 27 03/09/2023   Lab Results  Component Value Date   RETICCTPCT 3.6 (H) 03/09/2023   RBC 2.51 (L) 03/23/2023   No results found for: "KPAFRELGTCHN", "LAMBDASER", "KAPLAMBRATIO" No results found for: "IGGSERUM", "IGA", "IGMSERUM" No results found for: "TOTALPROTELP", "ALBUMINELP", "A1GS", "A2GS", "BETS", "BETA2SER", "GAMS", "MSPIKE", "SPEI"   Chemistry      Component Value Date/Time   NA 131 (L) 03/23/2023 0725   NA 143 01/01/2023 1236   K 5.2 (H) 03/23/2023 0725   CL 104 03/23/2023 0725   CO2 14 (L) 03/23/2023 0725   BUN 90 (H) 03/23/2023 0725   BUN 57 (H) 01/01/2023 1236   CREATININE 4.97 (H) 03/23/2023 0725   CREATININE 3.34 (H) 03/09/2023 1326      Component Value Date/Time   CALCIUM 8.3 (L) 03/23/2023 0725   ALKPHOS 122 03/23/2023 0725   AST 13 (L) 03/23/2023 0725   AST 9 (L) 03/09/2023 1326   ALT 9 03/23/2023 0725   ALT 10 03/09/2023 1326   BILITOT 0.4 03/23/2023 0725   BILITOT 0.4 03/09/2023 1326       Impression and Plan: Mr. Agate is a 57 yo caucasian female with metastatic endometrial cancer.  She has been on quite a few treatments.    Our goal now is clearly quality of life.  I want to try to improve her quality of life.  I know surgery will help as she is having pain with her right leg.  Her right  lower leg is at risk for fracture.  Hopefully, we will be able to get her to surgery.  Again surgery will have to be delayed for a week because of the urinary tract infection.  She got IV antibiotics in the office.  She will be put on Omnicef at home.  We are going to transfuse her 1 unit of blood today in the office.  Again, she understands accepts the nature of her risk for surgery.  She has a lot of issues that we will make surgery challenging.  Again I  realize that there is no way to try to fix everything so that her surgery will be low risk.  There definitely is that potential for complications.  She understands this.  Her sister understands this.  The point of surgery is to try to make her quality of life better so that she can ambulate without pain..  She is going to have radiation after her surgery.  Whether or not we do chemotherapy as a whole another issue.  I think the longer that we go without being able to treat her, then I think that is can be that difficult to actually give her treatment.  I would like to have her come back next week to the office.  We will check her labs.  We will try to optimize the lab work is much as we can.  Again, this is incredibly complicated.  Hopefully, we will be able to get her to surgery and get her through surgery safely.  I know that Anesthesiology and Orthopedic Surgery are incredible with respect to these high risk patients.    [name], MD 8/30/20242:10 PM

## 2023-03-23 NOTE — ED Notes (Signed)
Pt up to use the restroom. Monitored pt oxygen level while ambulating and oxygen level noted to drop to 85-86% on room air.

## 2023-03-23 NOTE — Progress Notes (Signed)
    Durable Medical Equipment  (From admission, onward)           Start     Ordered   03/23/23 1229  For home use only DME oxygen  Once       Question Answer Comment  Length of Need 6 Months   Mode or (Route) Nasal cannula   Liters per Minute 3   Frequency Continuous (stationary and portable oxygen unit needed)   Oxygen conserving device No   Oxygen delivery system Gas      03/23/23 1228

## 2023-03-23 NOTE — ED Provider Notes (Signed)
[facility] Provider Note   CSN: [id number] Arrival date & time: 03/23/23  0707     History  Chief Complaint  Patient presents with   Shortness of Breath    Zinia Fittipaldi is a 57 y.o. female.  HPI     57 year old female with a history of metastatic endometrial cancer to the lungs, ureters and bone, previously undergone chemo and radiation, CKD stage V, bilateral hydronephrosis status post bilateral ureteral stents and nephrostomy tube, hypertension, Takotsubo's cardiomyopathy, insulin-dependent diabetes, history of multiple DVTs currently on Lovenox, IVC filter, anemia of chronic disease, admission in May secondary to sepsis with blood culture positive for Pseudomonas, was admitted in July with increasing pulmonary metastases, DVTs, found to have a tibial mass with bony erosion and is scheduled to have an IM nail to stabilize this area prior to radiation who presents with concern for shortness of breath.  Reports that she had a fall last week the type II that looks she broke anything but has had soreness to her back since then.  She denies hitting her head, loss of consciousness or headache associated with this incident.  Reports for the last 3 days she has been having increasing shortness of breath, right-sided chest pain that is worse with deep breaths, fevers up to 102 at home.  In addition to the fevers, does acknowledge body aches, chills, nausea, congestion and sore throat.  Reports the right sided chest pain and shortness of breath.  Has felt lightheadedness when she goes to stand.  She had some bright red bloody vaginal discharge 1 time.  Denies any black or bloody stools. Has had some hematuria.  Has asymmetric leg swelling that is unchanged, and denies any new leg or arm pain or swelling.  Reports that she has been taking her Lovenox every day and has not missed any doses.  Home Medications Prior to Admission medications   Medication  Sig Start Date End Date Taking? Authorizing Provider  cefdinir (OMNICEF) 300 MG capsule Take 1 capsule (300 mg total) by mouth daily for 7 days. 03/23/23 03/30/23 Yes Alvira Monday, MD  acetaminophen (TYLENOL) 500 MG tablet Take 500 mg by mouth every 6 (six) hours as needed for mild pain.    [provider]  amLODipine (NORVASC) 2.5 MG tablet Take 1 tablet (2.5 mg total) by mouth daily. 03/01/23   Karie Georges, MD  carvedilol (COREG) 12.5 MG tablet TAKE 1 TABLET (12.5MG  TOTAL) BY MOUTH TWICE A DAY WITH MEALS 02/19/23   [name], MD  Continuous Glucose Sensor (FREESTYLE LIBRE 3 SENSOR) MISC 1 Act by Does not apply route daily. Place 1 sensor on the skin every 14 days. Use to check glucose continuously 12/26/22   Karie Georges, MD  dexamethasone (DECADRON) 4 MG tablet Take 1 tablet (4 mg total) by mouth daily. Patient not taking: Reported on 03/20/2023 02/11/23   Standley Brooking, MD  enoxaparin (LOVENOX) 60 MG/0.6ML injection Inject 0.6 mLs (60 mg total) into the skin daily. 02/10/23 05/11/23  Standley Brooking, MD  famotidine (PEPCID) 20 MG tablet Take 20 mg by mouth 2 (two) times daily as needed for heartburn or indigestion.    [provider]  furosemide (LASIX) 20 MG tablet TAKE 1 TABLET BY MOUTH EVERY DAY Patient taking differently: Take 20 mg by mouth daily as needed for fluid or edema. 01/22/23   Karie Georges, MD  gabapentin (NEURONTIN) 300 MG capsule Take 1 capsule (300 mg  total) by mouth 3 (three) times daily. 02/14/23   [name], MD  insulin aspart (NOVOLOG) 100 UNIT/ML injection Inject 0-10 Units into the skin 3 (three) times daily before meals.    [provider]  insulin detemir (LEVEMIR FLEXPEN) 100 UNIT/ML FlexPen INJECT 0.2MLS (20 UNITS) INTO THE SKIN TWICE A DAY 03/22/23   [name], MD  insulin detemir (LEVEMIR) 100 UNIT/ML injection Inject 0.2 mLs (20 Units total) into the skin 2 (two) times daily. Patient taking differently:  Inject 10-20 Units into the skin See admin instructions. Inject 10-20 units into the skin two times a day before meals, per sliding scale 08/30/22   Ennever, Rose Phi, MD  lidocaine (LIDODERM) 5 % Place 1 patch onto the skin daily. Remove & Discard patch within 12 hours or as directed by MD 03/09/23   [name], MD  oxybutynin (DITROPAN-XL) 10 MG 24 hr tablet Take 1 tablet (10 mg total) by mouth daily. 03/01/23   Karie Georges, MD  oxycodone (OXY-IR) 5 MG capsule Take 1 capsule (5 mg total) by mouth every 4 (four) hours as needed for pain. 12/13/22   [name], MD  polyethylene glycol (MIRALAX / GLYCOLAX) 17 g packet Take 17 g by mouth daily as needed for mild constipation. 09/05/21   Pokhrel, Rebekah Chesterfield, MD  prochlorperazine (COMPAZINE) 10 MG tablet Take 10 mg by mouth every 6 (six) hours as needed for nausea or vomiting. 08/18/21   [provider]  sodium bicarbonate 650 MG tablet Take 1 tablet (650 mg total) by mouth 3 (three) times daily. Patient taking differently: Take 650 mg by mouth 2 (two) times daily. 02/10/23   Standley Brooking, MD  venlafaxine XR (EFFEXOR XR) 37.5 MG 24 hr capsule Take 1 capsule (37.5 mg total) by mouth daily with breakfast. 12/26/22   Karie Georges, MD      Allergies    Paclitaxel and Taxotere [docetaxel]    Review of Systems   Review of Systems  Physical Exam Updated Vital Signs BP 129/76   Pulse 94   Temp 98.6 F (37 C)   Resp (!) 23   Ht 5\' 4"  (1.626 m)   Wt 61.2 kg   SpO2 98%   BMI 23.17 kg/m  Physical Exam Vitals and nursing note reviewed.  Constitutional:      General: She is not in acute distress.    Appearance: She is well-developed. She is not diaphoretic.  HENT:     Head: Normocephalic and atraumatic.  Eyes:     Conjunctiva/sclera: Conjunctivae normal.  Cardiovascular:     Rate and Rhythm: Normal rate and regular rhythm.     Heart sounds: Normal heart sounds. No murmur heard.    No friction rub. No gallop.   Pulmonary:     Effort: Pulmonary effort is normal. No respiratory distress.     Breath sounds: Wheezing and rales present.  Abdominal:     General: There is no distension.     Palpations: Abdomen is soft.     Tenderness: There is no abdominal tenderness. There is no guarding.  Musculoskeletal:        General: No tenderness.     Cervical back: Normal range of motion.  Skin:    General: Skin is warm and dry.     Findings: No erythema or rash.  Neurological:     Mental Status: She is alert and oriented to person, place, and time.     ED Results /  Procedures / Treatments   Labs (all labs ordered are listed, but only abnormal results are displayed) Labs Reviewed  CBC WITH DIFFERENTIAL/PLATELET - Abnormal; Notable for the following components:      Result Value   RBC 2.51 (*)    Hemoglobin 7.6 (*)    HCT 23.4 (*)    Lymphs Abs 0.5 (*)    Abs Immature Granulocytes 0.09 (*)    All other components within normal limits  COMPREHENSIVE METABOLIC PANEL - Abnormal; Notable for the following components:   Sodium 131 (*)    Potassium 5.2 (*)    CO2 14 (*)    Glucose, Bld 228 (*)    BUN 90 (*)    Creatinine, Ser 4.97 (*)    Calcium 8.3 (*)    Albumin 2.4 (*)    AST 13 (*)    GFR, Estimated 10 (*)    All other components within normal limits  BRAIN NATRIURETIC PEPTIDE - Abnormal; Notable for the following components:   B Natriuretic Peptide 559.0 (*)    All other components within normal limits  URINALYSIS, W/ REFLEX TO CULTURE (INFECTION SUSPECTED) - Abnormal; Notable for the following components:   Hgb urine dipstick LARGE (*)    Protein, ur >=300 (*)    Nitrite POSITIVE (*)    Leukocytes,Ua LARGE (*)    Bacteria, UA MANY (*)    All other components within normal limits  TROPONIN I (HIGH SENSITIVITY) - Abnormal; Notable for the following components:   Troponin I (High Sensitivity) 24 (*)    All other components within normal limits  TROPONIN I (HIGH SENSITIVITY) - Abnormal;  Notable for the following components:   Troponin I (High Sensitivity) 27 (*)    All other components within normal limits  SARS CORONAVIRUS 2 BY RT PCR  CULTURE, BLOOD (ROUTINE X 2)  CULTURE, BLOOD (ROUTINE X 2)  URINE CULTURE  LACTIC ACID, PLASMA    EKG EKG Interpretation Date/Time:  Friday March 23 2023 07:21:24 EDT Ventricular Rate:  102 PR Interval:  145 QRS Duration:  94 QT Interval:  308 QTC Calculation: 402 R Axis:   28  Text Interpretation: Sinus tachycardia Borderline low voltage, extremity leads Nonspecific T abnormalities, lateral leads Since prior ECG, rate has increased Confirmed by Alvira Monday ([id number]) on 03/23/2023 11:20:03 PM  Radiology CT Chest Wo Contrast  Result Date: 03/23/2023 CLINICAL DATA:  Shortness of breath, difficulty breathing, cough, fever. Status post fall, on Lovenox. Metastatic endometrial cancer. EXAM: CT CHEST, ABDOMEN AND PELVIS WITHOUT CONTRAST TECHNIQUE: Multidetector CT imaging of the chest, abdomen and pelvis was performed following the standard protocol without IV contrast. RADIATION DOSE REDUCTION: This exam was performed according to the departmental dose-optimization program which includes automated exposure control, adjustment of the mA and/or kV according to patient size and/or use of iterative reconstruction technique. COMPARISON:  CT chest abdomen pelvis dated 02/08/2023. PET-CT dated 02/07/2023. FINDINGS: CT CHEST FINDINGS Cardiovascular: Heart is normal in size. No pericardial effusion. Hypodense fluid pleural to myocardium, suggesting anemia. No evidence thoracic aortic aneurysm. Mild atherosclerotic calcifications of the aortic arch. Moderate three-vessel coronary atherosclerosis. Right chest port terminates in the upper right atrium. Mediastinum/Nodes: No suspicious mediastinal lymphadenopathy. Visualized thyroid is unremarkable. Lungs/Pleura: Multifocal pulmonary masses/metastases, including: --4.2 x 3.4 cm mass in the central right  upper lobe (series 302/image 37), previously 4.0 x 2.9 cm --6.8 x 5.0 cm left perihilar mass (series 302/image 86), previously 6.2 x 4.7 cm --6.8 x 6.3 cm right lower lobe mass (series  302/image 86), previously 6.6 x 4.4 cm when remeasured Additional scattered ground-glass opacities/interlobular septal thickening, favored to be related to the patient's pulmonary metastases and/or post treatment changes. Cavitary lesion with adjacent 10 mm peripheral mural nodule in the medial right lower lobe (series 302/image 36), previously 9 mm, grossly unchanged. This appearance favors primary bronchogenic carcinoma. Musculoskeletal: Mild degenerative changes of the thoracic spine. No fracture is seen. Specifically, the bilateral ribs are intact. Known lytic metastasis in the medial left scapula (series 301/image 7), poorly visualized on CT. Known osseous metastasis in the left lateral 10th rib is not well visualized on CT. Destructive lytic osseous metastasis in the left posterior elements at T12 (series 301/image 86), grossly unchanged. CT ABDOMEN PELVIS FINDINGS Hepatobiliary: Unenhanced liver is unremarkable. Layering gallstone (series 03/01/image 3), without associated inflammatory changes. No intrahepatic or extrahepatic dilatation. Pancreas: Within normal limits. Spleen: Within normal limits. Adrenals/Urinary Tract: Adrenal glands are within normal limits. Left renal cortical scarring/atrophy. Mild to moderate right hydronephrosis, mildly progressive. Indwelling double pigtail ureteral stents in satisfactory position. Bladder is within normal limits. Stomach/Bowel: Stomach is within normal limits. No evidence of bowel obstruction. Normal appendix (series 301/image 85). No colonic wall thickening or inflammatory changes. Vascular/Lymphatic: No evidence of abdominal aortic aneurysm. Atherosclerotic calcifications of the abdominal aorta and branch vessels. IVC filter. No suspicious abdominopelvic lymphadenopathy.  Reproductive: Status post hysterectomy. No adnexal masses. Other: No abdominopelvic ascites. Prior ventral hernia mesh repair. Musculoskeletal: No evidence of retroperitoneal hematoma. No fracture is seen. Degenerative changes of the lower lumbar spine. IMPRESSION: No acute traumatic injury to the chest, abdomen, or pelvis. Progressive multifocal pulmonary metastases, as above. Additional cavitary lesion with mural nodule in the medial right lower lobe is minimally progressive, favoring primary bronchogenic carcinoma. Known thoracic osseous metastases, as above, better evaluated on prior PET. Mild to moderate right hydronephrosis, mildly progressive. Indwelling double pigtail ureteral stents in satisfactory position. Electronically Signed   By: Charline Bills M.D.   On: 03/23/2023 09:04   CT ABDOMEN PELVIS WO CONTRAST  Result Date: 03/23/2023 CLINICAL DATA:  Shortness of breath, difficulty breathing, cough, fever. Status post fall, on Lovenox. Metastatic endometrial cancer. EXAM: CT CHEST, ABDOMEN AND PELVIS WITHOUT CONTRAST TECHNIQUE: Multidetector CT imaging of the chest, abdomen and pelvis was performed following the standard protocol without IV contrast. RADIATION DOSE REDUCTION: This exam was performed according to the departmental dose-optimization program which includes automated exposure control, adjustment of the mA and/or kV according to patient size and/or use of iterative reconstruction technique. COMPARISON:  CT chest abdomen pelvis dated 02/08/2023. PET-CT dated 02/07/2023. FINDINGS: CT CHEST FINDINGS Cardiovascular: Heart is normal in size. No pericardial effusion. Hypodense fluid pleural to myocardium, suggesting anemia. No evidence thoracic aortic aneurysm. Mild atherosclerotic calcifications of the aortic arch. Moderate three-vessel coronary atherosclerosis. Right chest port terminates in the upper right atrium. Mediastinum/Nodes: No suspicious mediastinal lymphadenopathy. Visualized thyroid  is unremarkable. Lungs/Pleura: Multifocal pulmonary masses/metastases, including: --4.2 x 3.4 cm mass in the central right upper lobe (series 302/image 37), previously 4.0 x 2.9 cm --6.8 x 5.0 cm left perihilar mass (series 302/image 86), previously 6.2 x 4.7 cm --6.8 x 6.3 cm right lower lobe mass (series 302/image 86), previously 6.6 x 4.4 cm when remeasured Additional scattered ground-glass opacities/interlobular septal thickening, favored to be related to the patient's pulmonary metastases and/or post treatment changes. Cavitary lesion with adjacent 10 mm peripheral mural nodule in the medial right lower lobe (series 302/image 36), previously 9 mm, grossly unchanged. This appearance favors primary bronchogenic carcinoma.  Musculoskeletal: Mild degenerative changes of the thoracic spine. No fracture is seen. Specifically, the bilateral ribs are intact. Known lytic metastasis in the medial left scapula (series 301/image 7), poorly visualized on CT. Known osseous metastasis in the left lateral 10th rib is not well visualized on CT. Destructive lytic osseous metastasis in the left posterior elements at T12 (series 301/image 86), grossly unchanged. CT ABDOMEN PELVIS FINDINGS Hepatobiliary: Unenhanced liver is unremarkable. Layering gallstone (series 03/01/image 3), without associated inflammatory changes. No intrahepatic or extrahepatic dilatation. Pancreas: Within normal limits. Spleen: Within normal limits. Adrenals/Urinary Tract: Adrenal glands are within normal limits. Left renal cortical scarring/atrophy. Mild to moderate right hydronephrosis, mildly progressive. Indwelling double pigtail ureteral stents in satisfactory position. Bladder is within normal limits. Stomach/Bowel: Stomach is within normal limits. No evidence of bowel obstruction. Normal appendix (series 301/image 85). No colonic wall thickening or inflammatory changes. Vascular/Lymphatic: No evidence of abdominal aortic aneurysm. Atherosclerotic  calcifications of the abdominal aorta and branch vessels. IVC filter. No suspicious abdominopelvic lymphadenopathy. Reproductive: Status post hysterectomy. No adnexal masses. Other: No abdominopelvic ascites. Prior ventral hernia mesh repair. Musculoskeletal: No evidence of retroperitoneal hematoma. No fracture is seen. Degenerative changes of the lower lumbar spine. IMPRESSION: No acute traumatic injury to the chest, abdomen, or pelvis. Progressive multifocal pulmonary metastases, as above. Additional cavitary lesion with mural nodule in the medial right lower lobe is minimally progressive, favoring primary bronchogenic carcinoma. Known thoracic osseous metastases, as above, better evaluated on prior PET. Mild to moderate right hydronephrosis, mildly progressive. Indwelling double pigtail ureteral stents in satisfactory position. Electronically Signed   By: Charline Bills M.D.   On: 03/23/2023 09:04    Procedures Procedures    Medications Ordered in ED Medications  sodium bicarbonate tablet 650 mg (650 mg Oral Given 03/23/23 1029)  sodium zirconium cyclosilicate (LOKELMA) packet 10 g (10 g Oral Given 03/23/23 1029)    ED Course/ Medical Decision Making/ A&P                                   57 year old female with a history of metastatic endometrial cancer to the lungs, ureters and bone, previously undergone chemo and radiation, CKD stage V, bilateral hydronephrosis status post bilateral ureteral stents and nephrostomy tube, hypertension, Takotsubo's cardiomyopathy, insulin-dependent diabetes, history of multiple DVTs currently on Lovenox, IVC filter, anemia of chronic disease, admission in May secondary to sepsis with blood culture positive for Pseudomonas, was admitted in July with increasing pulmonary metastases, DVTs, found to have a tibial mass with bony erosion and is scheduled to have an IM nail to stabilize this area prior to radiation who presents with concern for shortness of breath  found to have hypoxia on arrival to ED to 89%.   Differential diagnosis for dyspnea includes ACS, PE, CHF exacerbation, anemia, pneumonia, viral etiology such as COVID 19 infection with bronchitis, metabolic abnormality, worsening pulmonary metastases, rib fracture.  Given fall with rib tenderness, bilateral flank/lower back hematoma on lovenox, fever, hypoxia ordered CT chest and CT abdomen pelvis to evaluate for signs of retroperitoneal hematoma, occult rib fracture, further evaluation of lungs and consideration of pneumonia, worsening pulmonary metastases.  Due to her GFR at this time will defer contrast CT to evaluate for new PE.  Will also check blood cx/lactic/COVID.  EKG personally evalauted by me and shows sinus tachycardia without significant ST changes.  Labs completed and personally evaluated by me show mild hypokalemia, bicarb 14,  Cr worsening now to 4.97 from 3.34< BNP less than May   559, Troponin 24 and 27.   CT chest/abdomen pelvis shows progressive multifocal pulmonary metastases, cavitary lesion is progressive favoring primary bronchogenic carcinoma, osseous metastases, hydronephrosis.    Discussed possible hypoxia today may be secondary to progressive pulmonary metastases, cavitary lesion, also discussed possible PE despite lovenox/IVC filter.    UA with UTI. Discussed concern for infection.    Discussed recommendation for admission for worsening renal function and acute hypoxic respiratory failure, however she declines admission.    She is interested in palliative care and discussed with her and her sister who report she is seeing palliative care next week.  She is adamant she does not want to be admitted and understands the risks. At this time focus is quality of life and not quantity. She is DNR/DNI. She is still interested in antibiotics but not admission, interested in transfusion pRBCs. She never wants dialysis. After long discussion agree to focus on comfort care with  measures to help comfort.  Does feel abx are within her comfort care plan. Given rx for omincef for UTI.  Discussed and does feel that blood transfusion helps her quality of life.  Declines transfer to [facility],or other hospital for transfusion and would like to get it done through Dr. Myna Hidalgo.  Sent message to Dr. Myna Hidalgo and oncology team contacted and say the are able to take her for transfusion today as per her wishes.     Discussed with CM, who organized palliative home O2 to be delivered to Oncology office today while she is having transfusion.  Discussed with patient that her dyspnea may improve with improved anemia or worsen with volume.  Given rx for 3L O2.  She was transported to Hematology for blood transfusion with plan for dc with palliative care. Discussed taking bicarb as previously rx and given abx rx.           Final Clinical Impression(s) / ED Diagnoses Final diagnoses:  Hypoxia  Palliative care patient  Chronic kidney disease (CKD) stage G5/A3, glomerular filtration rate (GFR) less than or equal to 15 mL/min/1.73 square meter and albuminuria creatinine ratio greater than 300 mg/g (HCC)  Urinary tract infection without hematuria, site unspecified    Rx / DC Orders ED Discharge Orders          Ordered    cefdinir (OMNICEF) 300 MG capsule  Daily        03/23/23 1125              Alvira Monday, MD 03/23/23 2352

## 2023-03-23 NOTE — ED Notes (Signed)
SATURATION QUALIFICATIONS: (This note is used to comply with regulatory documentation for home oxygen)  Patient Saturations on Room Air at Rest = 91%  Patient Saturations on Room Air while Ambulating = 85%  Patient Saturations on 2 Liters of oxygen while Ambulating = 96%  Please briefly explain why patient needs home oxygen:

## 2023-03-23 NOTE — Discharge Planning (Signed)
Oletta Cohn, RN, BSN, [place] [phone] Pt qualifies for DME [facility]) home oxygen.  DME  ordered through [employer].  Mitch of [employer] notified to deliver DME to pt in Dr Gustavo Lah office at [facility] prior to D/C home.

## 2023-03-25 DIAGNOSIS — W1839XA Other fall on same level, initial encounter: Secondary | ICD-10-CM | POA: Insufficient documentation

## 2023-03-25 DIAGNOSIS — Z794 Long term (current) use of insulin: Secondary | ICD-10-CM | POA: Insufficient documentation

## 2023-03-25 DIAGNOSIS — N189 Chronic kidney disease, unspecified: Secondary | ICD-10-CM | POA: Insufficient documentation

## 2023-03-25 DIAGNOSIS — S299XXA Unspecified injury of thorax, initial encounter: Secondary | ICD-10-CM | POA: Diagnosis not present

## 2023-03-25 DIAGNOSIS — G8911 Acute pain due to trauma: Secondary | ICD-10-CM | POA: Diagnosis not present

## 2023-03-25 DIAGNOSIS — I959 Hypotension, unspecified: Secondary | ICD-10-CM | POA: Diagnosis not present

## 2023-03-25 DIAGNOSIS — M5134 Other intervertebral disc degeneration, thoracic region: Secondary | ICD-10-CM | POA: Diagnosis not present

## 2023-03-25 DIAGNOSIS — M898X9 Other specified disorders of bone, unspecified site: Secondary | ICD-10-CM | POA: Diagnosis not present

## 2023-03-25 DIAGNOSIS — I672 Cerebral atherosclerosis: Secondary | ICD-10-CM | POA: Diagnosis not present

## 2023-03-25 DIAGNOSIS — S3991XA Unspecified injury of abdomen, initial encounter: Secondary | ICD-10-CM | POA: Diagnosis not present

## 2023-03-25 DIAGNOSIS — Z79899 Other long term (current) drug therapy: Secondary | ICD-10-CM | POA: Insufficient documentation

## 2023-03-25 DIAGNOSIS — R0789 Other chest pain: Secondary | ICD-10-CM | POA: Insufficient documentation

## 2023-03-25 DIAGNOSIS — S199XXA Unspecified injury of neck, initial encounter: Secondary | ICD-10-CM | POA: Diagnosis not present

## 2023-03-25 DIAGNOSIS — Z8542 Personal history of malignant neoplasm of other parts of uterus: Secondary | ICD-10-CM | POA: Insufficient documentation

## 2023-03-25 DIAGNOSIS — S0990XA Unspecified injury of head, initial encounter: Secondary | ICD-10-CM | POA: Diagnosis not present

## 2023-03-25 DIAGNOSIS — Z043 Encounter for examination and observation following other accident: Secondary | ICD-10-CM | POA: Diagnosis not present

## 2023-03-25 DIAGNOSIS — R918 Other nonspecific abnormal finding of lung field: Secondary | ICD-10-CM | POA: Diagnosis not present

## 2023-03-25 DIAGNOSIS — N179 Acute kidney failure, unspecified: Secondary | ICD-10-CM | POA: Insufficient documentation

## 2023-03-25 DIAGNOSIS — C801 Malignant (primary) neoplasm, unspecified: Secondary | ICD-10-CM | POA: Diagnosis not present

## 2023-03-25 DIAGNOSIS — R748 Abnormal levels of other serum enzymes: Secondary | ICD-10-CM | POA: Insufficient documentation

## 2023-03-25 DIAGNOSIS — A419 Sepsis, unspecified organism: Secondary | ICD-10-CM | POA: Diagnosis not present

## 2023-03-25 DIAGNOSIS — I129 Hypertensive chronic kidney disease with stage 1 through stage 4 chronic kidney disease, or unspecified chronic kidney disease: Secondary | ICD-10-CM | POA: Insufficient documentation

## 2023-03-25 DIAGNOSIS — D649 Anemia, unspecified: Secondary | ICD-10-CM | POA: Diagnosis not present

## 2023-03-25 DIAGNOSIS — C7951 Secondary malignant neoplasm of bone: Secondary | ICD-10-CM | POA: Diagnosis not present

## 2023-03-25 DIAGNOSIS — D631 Anemia in chronic kidney disease: Secondary | ICD-10-CM | POA: Insufficient documentation

## 2023-03-25 DIAGNOSIS — S3993XA Unspecified injury of pelvis, initial encounter: Secondary | ICD-10-CM | POA: Diagnosis not present

## 2023-03-25 LAB — URINE CULTURE: Culture: 50000 — AB

## 2023-03-26 ENCOUNTER — Encounter (HOSPITAL_COMMUNITY): Payer: Self-pay

## 2023-03-26 ENCOUNTER — Emergency Department (HOSPITAL_COMMUNITY): Payer: 59

## 2023-03-26 ENCOUNTER — Other Ambulatory Visit: Payer: Self-pay

## 2023-03-26 ENCOUNTER — Emergency Department (HOSPITAL_COMMUNITY)
Admission: EM | Admit: 2023-03-26 | Discharge: 2023-03-26 | Disposition: A | Discharge status: Home / Self Care | Payer: 59 | Source: Home / Self Care | Attending: Emergency Medicine | Admitting: Emergency Medicine

## 2023-03-26 DIAGNOSIS — W19XXXA Unspecified fall, initial encounter: Secondary | ICD-10-CM

## 2023-03-26 DIAGNOSIS — S0990XA Unspecified injury of head, initial encounter: Secondary | ICD-10-CM | POA: Diagnosis not present

## 2023-03-26 DIAGNOSIS — S3991XA Unspecified injury of abdomen, initial encounter: Secondary | ICD-10-CM | POA: Diagnosis not present

## 2023-03-26 DIAGNOSIS — C801 Malignant (primary) neoplasm, unspecified: Secondary | ICD-10-CM | POA: Diagnosis not present

## 2023-03-26 DIAGNOSIS — S199XXA Unspecified injury of neck, initial encounter: Secondary | ICD-10-CM | POA: Diagnosis not present

## 2023-03-26 DIAGNOSIS — R748 Abnormal levels of other serum enzymes: Secondary | ICD-10-CM

## 2023-03-26 DIAGNOSIS — N179 Acute kidney failure, unspecified: Secondary | ICD-10-CM

## 2023-03-26 DIAGNOSIS — C7951 Secondary malignant neoplasm of bone: Secondary | ICD-10-CM | POA: Diagnosis not present

## 2023-03-26 DIAGNOSIS — S299XXA Unspecified injury of thorax, initial encounter: Secondary | ICD-10-CM | POA: Diagnosis not present

## 2023-03-26 DIAGNOSIS — S3993XA Unspecified injury of pelvis, initial encounter: Secondary | ICD-10-CM | POA: Diagnosis not present

## 2023-03-26 DIAGNOSIS — M5134 Other intervertebral disc degeneration, thoracic region: Secondary | ICD-10-CM | POA: Diagnosis not present

## 2023-03-26 DIAGNOSIS — M898X9 Other specified disorders of bone, unspecified site: Secondary | ICD-10-CM | POA: Diagnosis not present

## 2023-03-26 DIAGNOSIS — Z043 Encounter for examination and observation following other accident: Secondary | ICD-10-CM | POA: Diagnosis not present

## 2023-03-26 DIAGNOSIS — I672 Cerebral atherosclerosis: Secondary | ICD-10-CM | POA: Diagnosis not present

## 2023-03-26 LAB — TYPE AND SCREEN
ABO/RH(D): A POS
Antibody Screen: POSITIVE
Donor AG Type: NEGATIVE
Unit division: 0

## 2023-03-26 LAB — I-STAT CHEM 8, ED
BUN: 113 mg/dL — ABNORMAL HIGH (ref 6–20)
Calcium, Ion: 1.12 mmol/L — ABNORMAL LOW (ref 1.15–1.40)
Chloride: 106 mmol/L (ref 98–111)
Creatinine, Ser: 5.7 mg/dL — ABNORMAL HIGH (ref 0.44–1.00)
Glucose, Bld: 199 mg/dL — ABNORMAL HIGH (ref 70–99)
HCT: 22 % — ABNORMAL LOW (ref 36.0–46.0)
Hemoglobin: 7.5 g/dL — ABNORMAL LOW (ref 12.0–15.0)
Potassium: 4.7 mmol/L (ref 3.5–5.1)
Sodium: 133 mmol/L — ABNORMAL LOW (ref 135–145)
TCO2: 15 mmol/L — ABNORMAL LOW (ref 22–32)

## 2023-03-26 LAB — CK: Total CK: 3764 U/L — ABNORMAL HIGH (ref 38–234)

## 2023-03-26 LAB — BPAM RBC
Blood Product Expiration Date: 202409062359
ISSUE DATE / TIME: 202408301451
Unit Type and Rh: 6200

## 2023-03-26 LAB — CBC
HCT: 24.9 % — ABNORMAL LOW (ref 36.0–46.0)
Hemoglobin: 7.7 g/dL — ABNORMAL LOW (ref 12.0–15.0)
MCH: 28 pg (ref 26.0–34.0)
MCHC: 30.9 g/dL (ref 30.0–36.0)
MCV: 90.5 fL (ref 80.0–100.0)
Platelets: 181 10*3/uL (ref 150–400)
RBC: 2.75 MIL/uL — ABNORMAL LOW (ref 3.87–5.11)
RDW: 18.5 % — ABNORMAL HIGH (ref 11.5–15.5)
WBC: 5.4 10*3/uL (ref 4.0–10.5)
nRBC: 0 % (ref 0.0–0.2)

## 2023-03-26 LAB — COMPREHENSIVE METABOLIC PANEL
ALT: 17 U/L (ref 0–44)
AST: 62 U/L — ABNORMAL HIGH (ref 15–41)
Albumin: 2.1 g/dL — ABNORMAL LOW (ref 3.5–5.0)
Alkaline Phosphatase: 114 U/L (ref 38–126)
Anion gap: 13 (ref 5–15)
BUN: 99 mg/dL — ABNORMAL HIGH (ref 6–20)
CO2: 15 mmol/L — ABNORMAL LOW (ref 22–32)
Calcium: 7.9 mg/dL — ABNORMAL LOW (ref 8.9–10.3)
Chloride: 103 mmol/L (ref 98–111)
Creatinine, Ser: 5.19 mg/dL — ABNORMAL HIGH (ref 0.44–1.00)
GFR, Estimated: 9 mL/min — ABNORMAL LOW (ref >60)
Glucose, Bld: 203 mg/dL — ABNORMAL HIGH (ref 70–99)
Potassium: 4.5 mmol/L (ref 3.5–5.1)
Sodium: 131 mmol/L — ABNORMAL LOW (ref 135–145)
Total Bilirubin: 0.2 mg/dL — ABNORMAL LOW (ref 0.3–1.2)
Total Protein: 6.3 g/dL — ABNORMAL LOW (ref 6.5–8.1)

## 2023-03-26 LAB — TROPONIN I (HIGH SENSITIVITY)
Troponin I (High Sensitivity): 65 ng/L — ABNORMAL HIGH (ref <18)
Troponin I (High Sensitivity): 60 ng/L — ABNORMAL HIGH (ref <18)

## 2023-03-26 LAB — I-STAT CG4 LACTIC ACID, ED: Lactic Acid, Venous: 0.7 mmol/L (ref 0.5–1.9)

## 2023-03-26 MED ORDER — LACTATED RINGERS IV BOLUS
1000.0000 mL | Freq: Once | INTRAVENOUS | Status: AC
  Administered 2023-03-26: 1000 mL via INTRAVENOUS

## 2023-03-26 MED ORDER — AMOXICILLIN-POT CLAVULANATE 500-125 MG PO TABS
1.0000 | ORAL_TABLET | Freq: Every day | ORAL | 0 refills | Status: DC
Start: 2023-03-26 — End: 2023-04-08

## 2023-03-26 MED ORDER — OXYCODONE-ACETAMINOPHEN 5-325 MG PO TABS
2.0000 | ORAL_TABLET | Freq: Once | ORAL | Status: AC
  Administered 2023-03-26: 2 via ORAL
  Filled 2023-03-26: qty 2

## 2023-03-26 MED ORDER — HEPARIN SOD (PORK) LOCK FLUSH 100 UNIT/ML IV SOLN
INTRAVENOUS | Status: AC
  Administered 2023-03-26: 500 [IU]
  Filled 2023-03-26: qty 5

## 2023-03-26 NOTE — Discharge Instructions (Addendum)
Your lab work today shows worsening kidney function.  Your muscle enzymes are elevated which can contribute to this.  Ensure that you stay hydrated at home.  You will likely need to decrease your insulin dosing with your worsening kidney function.    Additionally, your urine culture results from 3 days ago resulted.  A new prescription for antibiotic was sent to your pharmacy.  Start this new medication and stop your old antibiotic.    Please return to the emergency department at any time if you experience any worsening symptoms or if you change your mind about admission for in-hospital care.

## 2023-03-26 NOTE — ED Triage Notes (Signed)
Pt. BIB [facility] for a fall. Pt. Is a cancer patient, with mets to the bone found recently. Pt. Did not lose consciousness or hit her head. She does take blood thinners. Pt. C/o L side pain.

## 2023-03-26 NOTE — Telephone Encounter (Signed)
Post ED Visit - Positive Culture Follow-up  Culture report reviewed by antimicrobial stewardship pharmacist: [facility] [x]  Delmar Landau, Pharm.D. []  Celedonio Miyamoto, Pharm.D., BCPS AQ-ID []  Garvin Fila, Pharm.D., BCPS []  Georgina Pillion, Pharm.D., BCPS []  [place], [place].D., BCPS, AAHIVP []  Estella Husk, Pharm.D., BCPS, AAHIVP []  Lysle Pearl, PharmD, BCPS []  Phillips Climes, PharmD, BCPS []  Agapito Games, PharmD, BCPS []  Verlan Friends, PharmD []  Mervyn Gay, PharmD, BCPS []  Vinnie Level, PharmD  [facility] []  Len Childs, PharmD []  Greer Pickerel, PharmD []  Adalberto Cole, PharmD []  Perlie Gold, Rph []  Lonell Face) Jean Rosenthal, PharmD []  Earl Many, PharmD []  Junita Push, PharmD []  Dorna Leitz, PharmD []  Terrilee Files, PharmD []  Lynann Beaver, PharmD []  Keturah Barre, PharmD []  Loralee Pacas, PharmD []  Bernadene Person, PharmD   Positive urine culture Treated with Cefdinir, organism sensitive to the same and no further patient follow-up is required at this time.Patient is currently in [facility] ED and Delmar Landau have contacted ED MD have been notified of findings  Bing Quarry 03/26/2023, 11:01 AM

## 2023-03-26 NOTE — ED Triage Notes (Signed)
EMS reports pt come from home.  Fell while in shower rt collar bone to center of sternum and lt axillary. CBG  290 92/74 BP 96.6 o2 2 L oxygen   93 HR

## 2023-03-26 NOTE — Progress Notes (Signed)
ED Antimicrobial Stewardship Positive Culture Follow Up   Jaime Gomez is an 57 y.o. female who presented to [facility] on 03/26/2023 with a chief complaint of  Chief Complaint  Patient presents with   Fall    Recent Results (from the past 720 hour(s))  SARS Coronavirus 2 by RT PCR (hospital order, performed in [facility] hospital lab) *cepheid single result test* Anterior Nasal Swab     Status: None   Collection Time: 03/23/23  7:26 AM   Specimen: Anterior Nasal Swab  Result Value Ref Range Status   SARS Coronavirus 2 by RT PCR NEGATIVE NEGATIVE Final    Comment: (NOTE) SARS-CoV-2 target nucleic acids are NOT DETECTED.  The SARS-CoV-2 RNA is generally detectable in upper and lower respiratory specimens during the acute phase of infection. The lowest concentration of SARS-CoV-2 viral copies this assay can detect is 250 copies / mL. A negative result does not preclude SARS-CoV-2 infection and should not be used as the sole basis for treatment or other patient management decisions.  A negative result may occur with improper specimen collection / handling, submission of specimen other than nasopharyngeal swab, presence of viral mutation(s) within the areas targeted by this assay, and inadequate number of viral copies (<250 copies / mL). A negative result must be combined with clinical observations, patient history, and epidemiological information.  Fact Sheet for Patients:   RoadLapTop.co.za  Fact Sheet for Healthcare Providers: http://kim-miller.com/  This test is not yet approved or  cleared by the [place] FDA and has been authorized for detection and/or diagnosis of SARS-CoV-2 by FDA under an Emergency Use Authorization (EUA).  This EUA will remain in effect (meaning this test can be used) for the duration of the COVID-19 declaration under Section 564(b)(1) of the Act, 21 U.S.C. section 360bbb-3(b)(1), unless the authorization  is terminated or revoked sooner.  Performed at [facility], [address] [place], [place] [address]   Blood culture (routine x 2)     Status: None (Preliminary result)   Collection Time: 03/23/23  7:40 AM   Specimen: BLOOD  Result Value Ref Range Status   Specimen Description   Final    BLOOD LEFT ANTECUBITAL Performed at [facility], [address] [place], [place] [address]    Special Requests   Final    BOTTLES DRAWN AEROBIC AND ANAEROBIC Blood Culture results may not be optimal due to an inadequate volume of blood received in culture bottles Performed at [facility], [address] [place], [place] [address]    Culture   Final    NO GROWTH 2 DAYS Performed at [facility], [address], [place], [place] [address]    Report Status PENDING  Incomplete  Blood culture (routine x 2)     Status: None (Preliminary result)   Collection Time: 03/23/23  7:55 AM   Specimen: BLOOD  Result Value Ref Range Status   Specimen Description   Final    BLOOD RIGHT ANTECUBITAL Performed at [facility], [address] [place], [place] [address]    Special Requests   Final    BOTTLES DRAWN AEROBIC AND ANAEROBIC Blood Culture adequate volume Performed at [facility], [address] [place], [place] [address]    Culture   Final    NO GROWTH 2 DAYS Performed at [facility], [address] [place], [place] [address]  Report Status PENDING  Incomplete  Urine Culture     Status: Abnormal   Collection Time: 03/23/23 10:45 AM   Specimen: Urine, Random  Result Value Ref Range Status   Specimen Description   Final    URINE, RANDOM Performed at [facility], [address] [place], [place] [address]    Special Requests   Final    NONE Reflexed from [id number] Performed at [facility], [address] [place], [place] [address]    Culture (A)  Final    50,000 COLONIES/mL ENTEROCOCCUS  FAECALIS 50,000 COLONIES/mL DIPHTHEROIDS(CORYNEBACTERIUM SPECIES) Standardized susceptibility testing for this organism is not available. Performed at [facility], [address], [place], [place] [address]    Report Status 03/25/2023 FINAL  Final   Organism ID, Bacteria ENTEROCOCCUS FAECALIS (A)  Final      Susceptibility   Enterococcus faecalis - MIC*    AMPICILLIN <=2 SENSITIVE Sensitive     NITROFURANTOIN <=16 SENSITIVE Sensitive     VANCOMYCIN 2 SENSITIVE Sensitive     * 50,000 COLONIES/mL ENTEROCOCCUS FAECALIS    [x]  Treated with cefdinir, organism resistant to prescribed antimicrobial.  Patient is in the [facility] ED this morning and being seen by MD Dixon. I have notified MD Dixon of findings and provided recommendations.  PO treatment options: Augmentin 500/125 mg 1 tablet daily IV treatment options: Unasyn, ampicillin, vancomycin (though likely not preferred given AKI)  Delmar Landau, PharmD, BCPS 03/26/2023 7:31 AM ED Clinical Pharmacist -  [phone]

## 2023-03-26 NOTE — ED Provider Notes (Signed)
EMERGENCY DEPARTMENT AT [facility] Provider Note   CSN: [id number] Arrival date & time: 03/25/23  2349     History  Chief Complaint  Patient presents with   [name]    Shalise Asplin is a 57 y.o. female.   Fall Associated symptoms include chest pain.  Patient presents after a fall.  Medical history includes endometrial cancer, anemia, DVT, CKD, HTN, gastroparesis, PE.  She was seen in the ED 3 days ago for shortness of breath and fever.  At that time, she was found to have AKI, UTI, and hypoxia, suspected to be from progression of pulmonary metastases.  She declined admission.  She was prescribed Omnicef.  Social work plan on arranging home O2.  She did get home O2 delivered.  She states that she has been taking her antibiotics.  Tonight, at approximately 8 PM, she was in the shower when she became dizzy.  This did cause her to fall.  She denies loss of consciousness.  When she fell, she struck her left side and has since had pain in left lateral lower ribs.  She also endorses pain in her right shoulder.  At baseline, she takes as needed 5 mg oxycodone.  Last dose of this was earlier this afternoon.  She is on blood thinners.  She is unsure if she struck her head.     Home Medications Prior to Admission medications   Medication Sig Start Date End Date Taking? Authorizing Provider  acetaminophen (TYLENOL) 500 MG tablet Take 500 mg by mouth every 6 (six) hours as needed for mild pain.    [provider]  amLODipine (NORVASC) 2.5 MG tablet Take 1 tablet (2.5 mg total) by mouth daily. 03/01/23   Karie Georges, MD  carvedilol (COREG) 12.5 MG tablet TAKE 1 TABLET (12.5MG  TOTAL) BY MOUTH TWICE A DAY WITH MEALS 02/19/23   [name], MD  cefdinir (OMNICEF) 300 MG capsule Take 1 capsule (300 mg total) by mouth daily for 7 days. 03/23/23 03/30/23  Alvira Monday, MD  Continuous Glucose Sensor (FREESTYLE LIBRE 3 SENSOR) MISC 1 Act by Does not apply route daily.  Place 1 sensor on the skin every 14 days. Use to check glucose continuously 12/26/22   Karie Georges, MD  dexamethasone (DECADRON) 4 MG tablet Take 1 tablet (4 mg total) by mouth daily. Patient not taking: Reported on 03/20/2023 02/11/23   Standley Brooking, MD  enoxaparin (LOVENOX) 60 MG/0.6ML injection Inject 0.6 mLs (60 mg total) into the skin daily. 02/10/23 05/11/23  Standley Brooking, MD  famotidine (PEPCID) 20 MG tablet Take 20 mg by mouth 2 (two) times daily as needed for heartburn or indigestion.    [provider]  furosemide (LASIX) 20 MG tablet TAKE 1 TABLET BY MOUTH EVERY DAY Patient taking differently: Take 20 mg by mouth daily as needed for fluid or edema. 01/22/23   Karie Georges, MD  gabapentin (NEURONTIN) 300 MG capsule Take 1 capsule (300 mg total) by mouth 3 (three) times daily. 02/14/23   [name], MD  insulin aspart (NOVOLOG) 100 UNIT/ML injection Inject 0-10 Units into the skin 3 (three) times daily before meals.    [provider]  insulin detemir (LEVEMIR FLEXPEN) 100 UNIT/ML FlexPen INJECT 0.2MLS (20 UNITS) INTO THE SKIN TWICE A DAY 03/22/23   [name], MD  insulin detemir (LEVEMIR) 100 UNIT/ML injection Inject 0.2 mLs (20 Units total) into the skin 2 (two) times daily. Patient taking  differently: Inject 10-20 Units into the skin See admin instructions. Inject 10-20 units into the skin two times a day before meals, per sliding scale 08/30/22   Ennever, Rose Phi, MD  lidocaine (LIDODERM) 5 % Place 1 patch onto the skin daily. Remove & Discard patch within 12 hours or as directed by MD 03/09/23   [name], MD  oxybutynin (DITROPAN-XL) 10 MG 24 hr tablet Take 1 tablet (10 mg total) by mouth daily. 03/01/23   Karie Georges, MD  oxycodone (OXY-IR) 5 MG capsule Take 1 capsule (5 mg total) by mouth every 4 (four) hours as needed for pain. 12/13/22   [name], MD  polyethylene glycol (MIRALAX / GLYCOLAX) 17 g packet Take 17 g by  mouth daily as needed for mild constipation. 09/05/21   Pokhrel, Rebekah Chesterfield, MD  prochlorperazine (COMPAZINE) 10 MG tablet Take 10 mg by mouth every 6 (six) hours as needed for nausea or vomiting. 08/18/21   [provider]  sodium bicarbonate 650 MG tablet Take 1 tablet (650 mg total) by mouth 3 (three) times daily. Patient taking differently: Take 650 mg by mouth 2 (two) times daily. 02/10/23   Standley Brooking, MD  venlafaxine XR (EFFEXOR XR) 37.5 MG 24 hr capsule Take 1 capsule (37.5 mg total) by mouth daily with breakfast. 12/26/22   Karie Georges, MD      Allergies    Paclitaxel and Taxotere [docetaxel]    Review of Systems   Review of Systems  Cardiovascular:  Positive for chest pain.  Musculoskeletal:  Positive for arthralgias.  Neurological:  Positive for dizziness.  All other systems reviewed and are negative.   Physical Exam Updated Vital Signs BP (!) 144/88   Pulse 87   Temp 97.8 F (36.6 C)   Resp (!) 23   SpO2 99%  Physical Exam Vitals and nursing note reviewed.  Constitutional:      General: She is not in acute distress.    Appearance: Normal appearance. She is well-developed. She is ill-appearing (Chronically). She is not toxic-appearing or diaphoretic.  HENT:     Head: Normocephalic and atraumatic.     Right Ear: External ear normal.     Left Ear: External ear normal.     Nose: Nose normal.     Mouth/Throat:     Mouth: Mucous membranes are moist.  Eyes:     Extraocular Movements: Extraocular movements intact.     Conjunctiva/sclera: Conjunctivae normal.  Cardiovascular:     Rate and Rhythm: Normal rate and regular rhythm.  Pulmonary:     Effort: Pulmonary effort is normal. No respiratory distress.     Breath sounds: Normal breath sounds. No wheezing or rales.  Chest:     Chest wall: Tenderness present.  Abdominal:     General: There is no distension.     Palpations: Abdomen is soft.     Tenderness: There is no abdominal tenderness.   Musculoskeletal:        General: No swelling or deformity. Normal range of motion.     Cervical back: Normal range of motion and neck supple.  Skin:    General: Skin is warm and dry.     Coloration: Skin is not jaundiced or pale.  Neurological:     General: No focal deficit present.     Mental Status: She is alert and oriented to person, place, and time.  Psychiatric:        Mood and Affect: Mood normal.  Behavior: Behavior normal.     ED Results / Procedures / Treatments   Labs (all labs ordered are listed, but only abnormal results are displayed) Labs Reviewed  COMPREHENSIVE METABOLIC PANEL - Abnormal; Notable for the following components:      Result Value   Sodium 131 (*)    CO2 15 (*)    Glucose, Bld 203 (*)    BUN 99 (*)    Creatinine, Ser 5.19 (*)    Calcium 7.9 (*)    Total Protein 6.3 (*)    Albumin 2.1 (*)    AST 62 (*)    Total Bilirubin 0.2 (*)    GFR, Estimated 9 (*)    All other components within normal limits  CBC - Abnormal; Notable for the following components:   RBC 2.75 (*)    Hemoglobin 7.7 (*)    HCT 24.9 (*)    RDW 18.5 (*)    All other components within normal limits  CK - Abnormal; Notable for the following components:   Total CK 3,764 (*)    All other components within normal limits  I-STAT CHEM 8, ED - Abnormal; Notable for the following components:   Sodium 133 (*)    BUN 113 (*)    Creatinine, Ser 5.70 (*)    Glucose, Bld 199 (*)    Calcium, Ion 1.12 (*)    TCO2 15 (*)    Hemoglobin 7.5 (*)    HCT 22.0 (*)    All other components within normal limits  TROPONIN I (HIGH SENSITIVITY) - Abnormal; Notable for the following components:   Troponin I (High Sensitivity) 65 (*)    All other components within normal limits  I-STAT CG4 LACTIC ACID, ED  TROPONIN I (HIGH SENSITIVITY)    EKG None  Radiology CT CHEST ABDOMEN PELVIS WO CONTRAST  Result Date: 03/26/2023 CLINICAL DATA:  Polytrauma, blunt. Fall. Metastatic endometrial  cancer with bone metastasis. EXAM: CT CHEST, ABDOMEN AND PELVIS WITHOUT CONTRAST TECHNIQUE: Multidetector CT imaging of the chest, abdomen and pelvis was performed following the standard protocol without IV contrast. RADIATION DOSE REDUCTION: This exam was performed according to the departmental dose-optimization program which includes automated exposure control, adjustment of the mA and/or kV according to patient size and/or use of iterative reconstruction technique. COMPARISON:  03/23/2023. FINDINGS: CT CHEST FINDINGS Cardiovascular: Heart is normal in size and there is a trace pericardial effusion. Multi-vessel coronary artery calcifications are noted. A right chest port terminates at the cavoatrial junction. There is atherosclerotic calcification of the aorta without evidence of aneurysm. The pulmonary trunk is normal in caliber. Mediastinum/Nodes: No mediastinal or axillary lymphadenopathy. Evaluation of the hila is limited due to lack of IV contrast. The thyroid gland, trachea, and esophagus are within normal limits. Lungs/Pleura: Increased scattered ground-glass opacities are noted in the right lung. Stable ground-glass opacities are present in the left lung. There is redemonstration of masses in the right upper lobe measuring 4.5 x 3.9 cm, not significantly changed when differences in technique are taken into consideration. There is a mass in the right lower lobe measuring 6.0 x 4.9 cm, axial image 90. There is a mass in the left upper lobe measuring 5.4 x 4.9 cm, axial image 85. A stable 1.2 cm nodule is present in the right lower lobe seen within a cavitary structure, axial image 111. Additional smaller nodular opacities are seen bilaterally and unchanged. No effusion or pneumothorax is seen. Musculoskeletal: Degenerative changes are present in the thoracic spine. No acute  fracture is seen. A stable lytic region is noted in the medial border of the left scapula. There is a lytic lesion in the tenth rib on  the left, unchanged. CT ABDOMEN PELVIS FINDINGS Hepatobiliary: No focal liver abnormality is seen. A stone is present within the gallbladder. No biliary ductal dilatation. Pancreas: Unremarkable. No pancreatic ductal dilatation or surrounding inflammatory changes. Spleen: Normal in size without focal abnormality. Adrenals/Urinary Tract: No adrenal nodule or mass. Left renal atrophy is noted. Renal calculi are noted bilaterally. There is a left ureteral stent in place without evidence of obstructive uropathy. There is a right ureteral stent in place with mild hydroureteronephrosis. No obstructing stone is seen. The bladder is unremarkable. Stomach/Bowel: Stomach is within normal limits. Appendix appears normal. No evidence of bowel wall thickening, distention, or inflammatory changes. No free air or pneumatosis. Scattered diverticula are present along the colon without evidence of diverticulitis. Vascular/Lymphatic: An IVC filter is noted. Aortic atherosclerosis. No abdominal or pelvic lymphadenopathy. Reproductive: Status post hysterectomy. No adnexal masses. Other: No abdominopelvic ascites. Musculoskeletal: Degenerative changes are present in the lumbar spine. No acute osseous abnormality is seen. IMPRESSION: 1. Stable lytic lesions in the bones without evidence of acute fracture. 2. Bilateral pulmonary masses and nodules, not significantly changed when differences in technique are taken into consideration. Increased ground-glass opacities are present in the right lung and stable ground-glass opacities are noted in the left lung, possibly representing metastatic disease. Clinical correlation is recommended to exclude superimposed infection. 3. Cavitary lesion with nodules in the right lower lobe, suspicious for bronchogenic carcinoma. 4. Bilateral nephrolithiasis with ureteral stents in place. There is mild-to-moderate hydroureteronephrosis on the right with no evidence of obstructing stone. Electronically Signed    By: Thornell Sartorius M.D.   On: 03/26/2023 04:19   CT L-SPINE NO CHARGE  Result Date: 03/26/2023 CLINICAL DATA:  fall. Pt. Is a cancer patient, with mets to the bone found recently. Pt. Did not lose consciousness or hit her head. She does take blood thinners. Pt. C/o L side pain. EXAM: CT THORACIC AND LUMBAR SPINE WITHOUT CONTRAST TECHNIQUE: Multidetector CT imaging of the thoracic and lumbar spine was performed without contrast. Multiplanar CT image reconstructions were also generated. RADIATION DOSE REDUCTION: This exam was performed according to the departmental dose-optimization program which includes automated exposure control, adjustment of the mA and/or kV according to patient size and/or use of iterative reconstruction technique. COMPARISON:  None Available. FINDINGS: CT THORACIC SPINE FINDINGS Alignment: Normal. Vertebrae: Multilevel mild-to-moderate degenerative changes of the spine. No associated severe osseous neural foraminal or central canal stenosis. Lytic lesion of the T12 left inferior articular facet and lamina with extension to the spinous process. Destruction of the cortex. Paraspinal and other soft tissues: Negative. Disc levels: Mild multilevel intervertebral disc space narrowing. CT LUMBAR SPINE FINDINGS Segmentation: 5 lumbar type vertebrae. Alignment: Normal. Vertebrae: L5-S1 posterior disc osteophyte complex formation. No associated severe osseous neural foraminal or central canal stenosis. No acute fracture or focal pathologic process. Paraspinal and other soft tissues: Negative. Disc levels: Maintained. Other: Atherosclerotic plaque. Right chest wall Port-A-Cath. Inferior vena cava filter. Bilateral ureteral stents. Question lytic lesion of the S5 level. Please see separately dictated CT chest abdomen pelvis 03/26/2023. IMPRESSION: CT THORACIC SPINE IMPRESSION 1. Lytic lesion of the T12 left inferior articular facet and lamina with extension to the spinous process. Destruction of the  cortex. Finding consistent with metastatic disease as noted in the history. 2. No acute displaced fracture or traumatic listhesis of the thoracic  spine. CT LUMBAR SPINE IMPRESSION 1. No acute displaced fracture or traumatic listhesis of the lumbar spine. Other imaging findings of potential clinical significance: 1. Lung disease. 2. Question lytic lesion of the S5 level. 3. Aortic Atherosclerosis (ICD10-I70.0). 4. Please see separately dictated CT chest abdomen pelvis 03/26/2023. Electronically Signed   By: Tish Frederickson M.D.   On: 03/26/2023 04:06   CT T-SPINE NO CHARGE  Result Date: 03/26/2023 CLINICAL DATA:  fall. Pt. Is a cancer patient, with mets to the bone found recently. Pt. Did not lose consciousness or hit her head. She does take blood thinners. Pt. C/o L side pain. EXAM: CT THORACIC AND LUMBAR SPINE WITHOUT CONTRAST TECHNIQUE: Multidetector CT imaging of the thoracic and lumbar spine was performed without contrast. Multiplanar CT image reconstructions were also generated. RADIATION DOSE REDUCTION: This exam was performed according to the departmental dose-optimization program which includes automated exposure control, adjustment of the mA and/or kV according to patient size and/or use of iterative reconstruction technique. COMPARISON:  None Available. FINDINGS: CT THORACIC SPINE FINDINGS Alignment: Normal. Vertebrae: Multilevel mild-to-moderate degenerative changes of the spine. No associated severe osseous neural foraminal or central canal stenosis. Lytic lesion of the T12 left inferior articular facet and lamina with extension to the spinous process. Destruction of the cortex. Paraspinal and other soft tissues: Negative. Disc levels: Mild multilevel intervertebral disc space narrowing. CT LUMBAR SPINE FINDINGS Segmentation: 5 lumbar type vertebrae. Alignment: Normal. Vertebrae: L5-S1 posterior disc osteophyte complex formation. No associated severe osseous neural foraminal or central canal stenosis.  No acute fracture or focal pathologic process. Paraspinal and other soft tissues: Negative. Disc levels: Maintained. Other: Atherosclerotic plaque. Right chest wall Port-A-Cath. Inferior vena cava filter. Bilateral ureteral stents. Question lytic lesion of the S5 level. Please see separately dictated CT chest abdomen pelvis 03/26/2023. IMPRESSION: CT THORACIC SPINE IMPRESSION 1. Lytic lesion of the T12 left inferior articular facet and lamina with extension to the spinous process. Destruction of the cortex. Finding consistent with metastatic disease as noted in the history. 2. No acute displaced fracture or traumatic listhesis of the thoracic spine. CT LUMBAR SPINE IMPRESSION 1. No acute displaced fracture or traumatic listhesis of the lumbar spine. Other imaging findings of potential clinical significance: 1. Lung disease. 2. Question lytic lesion of the S5 level. 3. Aortic Atherosclerosis (ICD10-I70.0). 4. Please see separately dictated CT chest abdomen pelvis 03/26/2023. Electronically Signed   By: Tish Frederickson M.D.   On: 03/26/2023 04:06   CT HEAD WO CONTRAST  Result Date: 03/26/2023 CLINICAL DATA:  Head trauma, moderate-severe; Polytrauma, blunt EXAM: CT HEAD WITHOUT CONTRAST CT CERVICAL SPINE WITHOUT CONTRAST TECHNIQUE: Multidetector CT imaging of the head and cervical spine was performed following the standard protocol without intravenous contrast. Multiplanar CT image reconstructions of the cervical spine were also generated. RADIATION DOSE REDUCTION: This exam was performed according to the departmental dose-optimization program which includes automated exposure control, adjustment of the mA and/or kV according to patient size and/or use of iterative reconstruction technique. COMPARISON:  CT head 02/20/2023 FINDINGS: CT HEAD FINDINGS Brain: Patchy and confluent areas of decreased attenuation are noted throughout the deep and periventricular white matter of the cerebral hemispheres bilaterally,  compatible with chronic microvascular ischemic disease. Left parietal central semiovale encephalomalacia. No evidence of large-territorial acute infarction. No parenchymal hemorrhage. No mass lesion. No extra-axial collection. No mass effect or midline shift. No hydrocephalus. Basilar cisterns are patent. Vascular: No hyperdense vessel. Atherosclerotic calcifications are present within the cavernous internal carotid and vertebral arteries. Skull:  No acute fracture or focal lesion. Sinuses/Orbits: Paranasal sinuses and mastoid air cells are clear. The orbits are unremarkable. Other: None. CT CERVICAL SPINE FINDINGS Alignment: Normal. Skull base and vertebrae: Multilevel mild-to-moderate degenerative changes of the spine with posterior disc osteophyte complex formation at the C3-C4 and C5-C6 levels. No associated severe osseous neural foraminal or central canal stenosis. No acute fracture. No aggressive appearing focal osseous lesion or focal pathologic process. Soft tissues and spinal canal: No prevertebral fluid or swelling. No visible canal hematoma. Upper chest: Biapical ground-glass and consolidative peribronchovascular airspace opacities. No apical pneumothoraces. Other: Partially visualized right internal jugular central venous catheter. IMPRESSION: 1. No acute intracranial abnormality. 2. No acute displaced fracture or traumatic listhesis of the cervical spine. 3. Biapical ground-glass and consolidative peribronchovascular airspace opacities. Please see separately dictated CT chest 03/26/2023. Electronically Signed   By: Tish Frederickson M.D.   On: 03/26/2023 03:57   CT CERVICAL SPINE WO CONTRAST  Result Date: 03/26/2023 CLINICAL DATA:  Head trauma, moderate-severe; Polytrauma, blunt EXAM: CT HEAD WITHOUT CONTRAST CT CERVICAL SPINE WITHOUT CONTRAST TECHNIQUE: Multidetector CT imaging of the head and cervical spine was performed following the standard protocol without intravenous contrast. Multiplanar CT  image reconstructions of the cervical spine were also generated. RADIATION DOSE REDUCTION: This exam was performed according to the departmental dose-optimization program which includes automated exposure control, adjustment of the mA and/or kV according to patient size and/or use of iterative reconstruction technique. COMPARISON:  CT head 02/20/2023 FINDINGS: CT HEAD FINDINGS Brain: Patchy and confluent areas of decreased attenuation are noted throughout the deep and periventricular white matter of the cerebral hemispheres bilaterally, compatible with chronic microvascular ischemic disease. Left parietal central semiovale encephalomalacia. No evidence of large-territorial acute infarction. No parenchymal hemorrhage. No mass lesion. No extra-axial collection. No mass effect or midline shift. No hydrocephalus. Basilar cisterns are patent. Vascular: No hyperdense vessel. Atherosclerotic calcifications are present within the cavernous internal carotid and vertebral arteries. Skull: No acute fracture or focal lesion. Sinuses/Orbits: Paranasal sinuses and mastoid air cells are clear. The orbits are unremarkable. Other: None. CT CERVICAL SPINE FINDINGS Alignment: Normal. Skull base and vertebrae: Multilevel mild-to-moderate degenerative changes of the spine with posterior disc osteophyte complex formation at the C3-C4 and C5-C6 levels. No associated severe osseous neural foraminal or central canal stenosis. No acute fracture. No aggressive appearing focal osseous lesion or focal pathologic process. Soft tissues and spinal canal: No prevertebral fluid or swelling. No visible canal hematoma. Upper chest: Biapical ground-glass and consolidative peribronchovascular airspace opacities. No apical pneumothoraces. Other: Partially visualized right internal jugular central venous catheter. IMPRESSION: 1. No acute intracranial abnormality. 2. No acute displaced fracture or traumatic listhesis of the cervical spine. 3. Biapical  ground-glass and consolidative peribronchovascular airspace opacities. Please see separately dictated CT chest 03/26/2023. Electronically Signed   By: Tish Frederickson M.D.   On: 03/26/2023 03:57   DG Shoulder Right  Result Date: 03/26/2023 CLINICAL DATA:  fall EXAM: RIGHT SHOULDER - 2+ VIEW COMPARISON:  CT chest 03/26/2023 FINDINGS: There is no evidence of fracture or dislocation. There is no evidence of arthropathy or other focal bone abnormality. Soft tissues are unremarkable. Right chest wall accessed dialysis catheter with tip overlying the right atrium. Diffuse peribronchovascular central airspace and interstitial opacities. IMPRESSION: 1. Diffuse peribronchovascular central airspace and interstitial opacities. Please see separately dictated CT chest 03/26/2023. 2.  No acute displaced fracture or dislocation. Electronically Signed   By: Tish Frederickson M.D.   On: 03/26/2023 03:53    Procedures  Procedures    Medications Ordered in ED Medications  oxyCODONE-acetaminophen (PERCOCET/ROXICET) 5-325 MG per tablet 2 tablet (2 tablets Oral Given 03/26/23 0346)  lactated ringers bolus 1,000 mL (1,000 mLs Intravenous New Bag/Given 03/26/23 0418)    ED Course/ Medical Decision Making/ A&P                                 Medical Decision Making Amount and/or Complexity of Data Reviewed Labs: ordered. Radiology: ordered. ECG/medicine tests: ordered.  Risk Prescription drug management.   This patient presents to the ED for concern of fall, this involves an extensive number of treatment options, and is a complaint that carries with it a high risk of complications and morbidity.  The differential diagnosis includes acute injuries   Co morbidities that complicate the patient evaluation  endometrial cancer, anemia, DVT, CKD, HTN, gastroparesis, PE   Additional history obtained:  Additional history obtained from N/A External records from outside source obtained and reviewed including  EMR   Lab Tests:  I Ordered, and personally interpreted labs.  The pertinent results include: Worsening renal function, persistent non-anion gap metabolic acidosis, baseline anemia, no leukocytosis.  CK is elevated at 3764.  Troponin is mildly elevated.   Imaging Studies ordered:  I ordered imaging studies including CT of head, cervical spine, chest, abdomen, pelvis, T-spine, L-spine, right shoulder x-ray I independently visualized and interpreted imaging which showed no acute injuries; redemonstration of known metastatic disease I agree with the radiologist interpretation   Cardiac Monitoring: / EKG:  The patient was maintained on a cardiac monitor.  I personally viewed and interpreted the cardiac monitored which showed an underlying rhythm of: Sinus rhythm  Problem List / ED Course / Critical interventions / Medication management  Patient presenting after fall this evening.  Because of fall was in her syncopal symptoms.  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  This puts her at increased risk of fractures.  She currently endorses pain in right shoulder and left lower lateral ribs.  On exam, she has tenderness to her hips and pelvis as well.  Imaging studies were ordered to assess for acute injuries.  Percocet was ordered for analgesia.  She was last seen in the ED several days ago and noted to have AKI at the time.  Will recheck lab work today.  On lab work, patient does have worsening renal function.  CK is also elevated which may be contributing.  Per review of home medications, there do not appear to be any medications likely to be causing this.  Patient reports that she was on the ground for approximately 45 minutes today when she fell.  There may be some muscle breakdown from this.  Patient was given IV fluids while in the ED.  Additional workup findings include new precordial T wave inversions on EKG.  When checking troponins, troponin is mildly  elevated at 65.  Worsening renal function is likely contributory to this.  She denies chest pain.  She does have the ongoing right shoulder pain.  On imaging studies, no acute injuries were identified.  She does have redemonstration of known metastatic disease.  I spoke with the patient about lab findings.  Patient is resolute and not wanting to stay in the hospital.  She states that her goal is to stay comfortable at home for the days she has left.  She does have palliative care coming by in  the next couple days.  She was advised to return at any time if she does change her mind about admission.  She was discharged in stable condition. I ordered medication including IV fluids for hydration; Percocet for analgesia Reevaluation of the patient after these medicines showed that the patient improved I have reviewed the patients home medicines and have made adjustments as needed   Social Determinants of Health:  Does not ambulate, lives at home with family and home nurse support.        Final Clinical Impression(s) / ED Diagnoses Final diagnoses:  AKI (acute kidney injury) (HCC)  Elevated CK  Fall, initial encounter    Rx / DC Orders ED Discharge Orders     None         Gloris Manchester, MD 03/26/23 [phone]

## 2023-03-27 ENCOUNTER — Inpatient Hospital Stay: Payer: 59 | Attending: Hematology & Oncology

## 2023-03-27 ENCOUNTER — Emergency Department (HOSPITAL_BASED_OUTPATIENT_CLINIC_OR_DEPARTMENT_OTHER): Payer: 59

## 2023-03-27 ENCOUNTER — Ambulatory Visit: Payer: 59 | Admitting: Hematology & Oncology

## 2023-03-27 ENCOUNTER — Encounter (HOSPITAL_BASED_OUTPATIENT_CLINIC_OR_DEPARTMENT_OTHER): Payer: Self-pay | Admitting: Emergency Medicine

## 2023-03-27 ENCOUNTER — Inpatient Hospital Stay: Payer: 59

## 2023-03-27 ENCOUNTER — Other Ambulatory Visit: Payer: Self-pay

## 2023-03-27 ENCOUNTER — Ambulatory Visit (HOSPITAL_COMMUNITY): Admission: RE | Admit: 2023-03-27 | Payer: 59 | Source: Home / Self Care | Admitting: Orthopaedic Surgery

## 2023-03-27 ENCOUNTER — Other Ambulatory Visit: Payer: Self-pay | Admitting: *Deleted

## 2023-03-27 ENCOUNTER — Encounter (HOSPITAL_COMMUNITY): Admission: RE | Payer: Self-pay | Source: Home / Self Care

## 2023-03-27 ENCOUNTER — Inpatient Hospital Stay (HOSPITAL_BASED_OUTPATIENT_CLINIC_OR_DEPARTMENT_OTHER)
Admission: EM | Admit: 2023-03-27 | Discharge: 2023-04-08 | DRG: 682 | Disposition: A | Discharge status: Home / Self Care | Payer: 59 | Attending: Internal Medicine | Admitting: Internal Medicine

## 2023-03-27 ENCOUNTER — Ambulatory Visit: Payer: 59

## 2023-03-27 DIAGNOSIS — E11621 Type 2 diabetes mellitus with foot ulcer: Secondary | ICD-10-CM | POA: Diagnosis present

## 2023-03-27 DIAGNOSIS — Z7189 Other specified counseling: Secondary | ICD-10-CM | POA: Diagnosis not present

## 2023-03-27 DIAGNOSIS — J9611 Chronic respiratory failure with hypoxia: Secondary | ICD-10-CM | POA: Diagnosis not present

## 2023-03-27 DIAGNOSIS — D631 Anemia in chronic kidney disease: Secondary | ICD-10-CM | POA: Diagnosis present

## 2023-03-27 DIAGNOSIS — Z86711 Personal history of pulmonary embolism: Secondary | ICD-10-CM

## 2023-03-27 DIAGNOSIS — N289 Disorder of kidney and ureter, unspecified: Secondary | ICD-10-CM | POA: Diagnosis not present

## 2023-03-27 DIAGNOSIS — W19XXXA Unspecified fall, initial encounter: Secondary | ICD-10-CM | POA: Diagnosis present

## 2023-03-27 DIAGNOSIS — C7802 Secondary malignant neoplasm of left lung: Secondary | ICD-10-CM | POA: Diagnosis present

## 2023-03-27 DIAGNOSIS — I959 Hypotension, unspecified: Secondary | ICD-10-CM | POA: Diagnosis present

## 2023-03-27 DIAGNOSIS — D509 Iron deficiency anemia, unspecified: Secondary | ICD-10-CM | POA: Diagnosis present

## 2023-03-27 DIAGNOSIS — Z515 Encounter for palliative care: Secondary | ICD-10-CM

## 2023-03-27 DIAGNOSIS — E872 Acidosis, unspecified: Secondary | ICD-10-CM | POA: Diagnosis present

## 2023-03-27 DIAGNOSIS — L97519 Non-pressure chronic ulcer of other part of right foot with unspecified severity: Secondary | ICD-10-CM | POA: Diagnosis not present

## 2023-03-27 DIAGNOSIS — Z811 Family history of alcohol abuse and dependence: Secondary | ICD-10-CM

## 2023-03-27 DIAGNOSIS — M19071 Primary osteoarthritis, right ankle and foot: Secondary | ICD-10-CM | POA: Diagnosis not present

## 2023-03-27 DIAGNOSIS — E1165 Type 2 diabetes mellitus with hyperglycemia: Secondary | ICD-10-CM | POA: Diagnosis present

## 2023-03-27 DIAGNOSIS — C7801 Secondary malignant neoplasm of right lung: Secondary | ICD-10-CM | POA: Diagnosis present

## 2023-03-27 DIAGNOSIS — R42 Dizziness and giddiness: Secondary | ICD-10-CM | POA: Diagnosis present

## 2023-03-27 DIAGNOSIS — G893 Neoplasm related pain (acute) (chronic): Secondary | ICD-10-CM | POA: Diagnosis not present

## 2023-03-27 DIAGNOSIS — Z8261 Family history of arthritis: Secondary | ICD-10-CM

## 2023-03-27 DIAGNOSIS — M25511 Pain in right shoulder: Secondary | ICD-10-CM | POA: Diagnosis present

## 2023-03-27 DIAGNOSIS — N1831 Chronic kidney disease, stage 3a: Secondary | ICD-10-CM

## 2023-03-27 DIAGNOSIS — E860 Dehydration: Secondary | ICD-10-CM | POA: Diagnosis present

## 2023-03-27 DIAGNOSIS — K5730 Diverticulosis of large intestine without perforation or abscess without bleeding: Secondary | ICD-10-CM | POA: Diagnosis not present

## 2023-03-27 DIAGNOSIS — I129 Hypertensive chronic kidney disease with stage 1 through stage 4 chronic kidney disease, or unspecified chronic kidney disease: Secondary | ICD-10-CM | POA: Diagnosis present

## 2023-03-27 DIAGNOSIS — Z6824 Body mass index (BMI) 24.0-24.9, adult: Secondary | ICD-10-CM

## 2023-03-27 DIAGNOSIS — C7951 Secondary malignant neoplasm of bone: Secondary | ICD-10-CM | POA: Diagnosis present

## 2023-03-27 DIAGNOSIS — Z813 Family history of other psychoactive substance abuse and dependence: Secondary | ICD-10-CM

## 2023-03-27 DIAGNOSIS — N184 Chronic kidney disease, stage 4 (severe): Secondary | ICD-10-CM | POA: Diagnosis not present

## 2023-03-27 DIAGNOSIS — T796XXA Traumatic ischemia of muscle, initial encounter: Secondary | ICD-10-CM | POA: Diagnosis not present

## 2023-03-27 DIAGNOSIS — Z86718 Personal history of other venous thrombosis and embolism: Secondary | ICD-10-CM

## 2023-03-27 DIAGNOSIS — F321 Major depressive disorder, single episode, moderate: Secondary | ICD-10-CM | POA: Diagnosis not present

## 2023-03-27 DIAGNOSIS — N171 Acute kidney failure with acute cortical necrosis: Secondary | ICD-10-CM | POA: Diagnosis not present

## 2023-03-27 DIAGNOSIS — F419 Anxiety disorder, unspecified: Secondary | ICD-10-CM | POA: Diagnosis present

## 2023-03-27 DIAGNOSIS — Z923 Personal history of irradiation: Secondary | ICD-10-CM

## 2023-03-27 DIAGNOSIS — R600 Localized edema: Secondary | ICD-10-CM | POA: Diagnosis not present

## 2023-03-27 DIAGNOSIS — E1122 Type 2 diabetes mellitus with diabetic chronic kidney disease: Secondary | ICD-10-CM | POA: Diagnosis present

## 2023-03-27 DIAGNOSIS — F32A Depression, unspecified: Secondary | ICD-10-CM | POA: Diagnosis not present

## 2023-03-27 DIAGNOSIS — T40415A Adverse effect of fentanyl or fentanyl analogs, initial encounter: Secondary | ICD-10-CM | POA: Diagnosis not present

## 2023-03-27 DIAGNOSIS — E1143 Type 2 diabetes mellitus with diabetic autonomic (poly)neuropathy: Secondary | ICD-10-CM | POA: Diagnosis present

## 2023-03-27 DIAGNOSIS — Z7901 Long term (current) use of anticoagulants: Secondary | ICD-10-CM

## 2023-03-27 DIAGNOSIS — R748 Abnormal levels of other serum enzymes: Secondary | ICD-10-CM | POA: Diagnosis present

## 2023-03-27 DIAGNOSIS — E663 Overweight: Secondary | ICD-10-CM | POA: Diagnosis present

## 2023-03-27 DIAGNOSIS — Z960 Presence of urogenital implants: Secondary | ICD-10-CM | POA: Diagnosis not present

## 2023-03-27 DIAGNOSIS — Z818 Family history of other mental and behavioral disorders: Secondary | ICD-10-CM

## 2023-03-27 DIAGNOSIS — Z66 Do not resuscitate: Secondary | ICD-10-CM | POA: Diagnosis not present

## 2023-03-27 DIAGNOSIS — R0789 Other chest pain: Secondary | ICD-10-CM | POA: Diagnosis present

## 2023-03-27 DIAGNOSIS — Z888 Allergy status to other drugs, medicaments and biological substances status: Secondary | ICD-10-CM

## 2023-03-27 DIAGNOSIS — E8809 Other disorders of plasma-protein metabolism, not elsewhere classified: Secondary | ICD-10-CM | POA: Diagnosis not present

## 2023-03-27 DIAGNOSIS — M6282 Rhabdomyolysis: Secondary | ICD-10-CM | POA: Diagnosis present

## 2023-03-27 DIAGNOSIS — R109 Unspecified abdominal pain: Secondary | ICD-10-CM | POA: Diagnosis not present

## 2023-03-27 DIAGNOSIS — Z95828 Presence of other vascular implants and grafts: Secondary | ICD-10-CM

## 2023-03-27 DIAGNOSIS — G928 Other toxic encephalopathy: Secondary | ICD-10-CM | POA: Diagnosis not present

## 2023-03-27 DIAGNOSIS — E11649 Type 2 diabetes mellitus with hypoglycemia without coma: Secondary | ICD-10-CM | POA: Diagnosis not present

## 2023-03-27 DIAGNOSIS — Z79899 Other long term (current) drug therapy: Secondary | ICD-10-CM

## 2023-03-27 DIAGNOSIS — Z794 Long term (current) use of insulin: Secondary | ICD-10-CM | POA: Diagnosis not present

## 2023-03-27 DIAGNOSIS — R202 Paresthesia of skin: Secondary | ICD-10-CM | POA: Diagnosis not present

## 2023-03-27 DIAGNOSIS — C541 Malignant neoplasm of endometrium: Secondary | ICD-10-CM | POA: Diagnosis present

## 2023-03-27 DIAGNOSIS — N390 Urinary tract infection, site not specified: Secondary | ICD-10-CM | POA: Diagnosis not present

## 2023-03-27 DIAGNOSIS — Z83438 Family history of other disorder of lipoprotein metabolism and other lipidemia: Secondary | ICD-10-CM

## 2023-03-27 DIAGNOSIS — Z87442 Personal history of urinary calculi: Secondary | ICD-10-CM

## 2023-03-27 DIAGNOSIS — Y92239 Unspecified place in hospital as the place of occurrence of the external cause: Secondary | ICD-10-CM | POA: Diagnosis not present

## 2023-03-27 DIAGNOSIS — Z8673 Personal history of transient ischemic attack (TIA), and cerebral infarction without residual deficits: Secondary | ICD-10-CM

## 2023-03-27 DIAGNOSIS — Z833 Family history of diabetes mellitus: Secondary | ICD-10-CM

## 2023-03-27 DIAGNOSIS — Z9981 Dependence on supplemental oxygen: Secondary | ICD-10-CM

## 2023-03-27 DIAGNOSIS — B952 Enterococcus as the cause of diseases classified elsewhere: Secondary | ICD-10-CM | POA: Diagnosis not present

## 2023-03-27 DIAGNOSIS — R0602 Shortness of breath: Secondary | ICD-10-CM | POA: Diagnosis not present

## 2023-03-27 DIAGNOSIS — N136 Pyonephrosis: Secondary | ICD-10-CM | POA: Diagnosis present

## 2023-03-27 DIAGNOSIS — K8020 Calculus of gallbladder without cholecystitis without obstruction: Secondary | ICD-10-CM | POA: Diagnosis present

## 2023-03-27 DIAGNOSIS — Z90710 Acquired absence of both cervix and uterus: Secondary | ICD-10-CM

## 2023-03-27 DIAGNOSIS — Z825 Family history of asthma and other chronic lower respiratory diseases: Secondary | ICD-10-CM

## 2023-03-27 DIAGNOSIS — R4589 Other symptoms and signs involving emotional state: Secondary | ICD-10-CM | POA: Diagnosis not present

## 2023-03-27 DIAGNOSIS — N170 Acute kidney failure with tubular necrosis: Secondary | ICD-10-CM

## 2023-03-27 DIAGNOSIS — R918 Other nonspecific abnormal finding of lung field: Secondary | ICD-10-CM | POA: Diagnosis not present

## 2023-03-27 DIAGNOSIS — S82201A Unspecified fracture of shaft of right tibia, initial encounter for closed fracture: Secondary | ICD-10-CM | POA: Diagnosis not present

## 2023-03-27 DIAGNOSIS — D649 Anemia, unspecified: Secondary | ICD-10-CM | POA: Diagnosis not present

## 2023-03-27 DIAGNOSIS — E875 Hyperkalemia: Secondary | ICD-10-CM | POA: Diagnosis present

## 2023-03-27 DIAGNOSIS — N179 Acute kidney failure, unspecified: Secondary | ICD-10-CM | POA: Diagnosis not present

## 2023-03-27 DIAGNOSIS — E119 Type 2 diabetes mellitus without complications: Secondary | ICD-10-CM

## 2023-03-27 DIAGNOSIS — Z8249 Family history of ischemic heart disease and other diseases of the circulatory system: Secondary | ICD-10-CM

## 2023-03-27 DIAGNOSIS — A419 Sepsis, unspecified organism: Secondary | ICD-10-CM | POA: Diagnosis not present

## 2023-03-27 DIAGNOSIS — Z8543 Personal history of malignant neoplasm of ovary: Secondary | ICD-10-CM

## 2023-03-27 DIAGNOSIS — I700 Atherosclerosis of aorta: Secondary | ICD-10-CM | POA: Diagnosis not present

## 2023-03-27 LAB — URINALYSIS, W/ REFLEX TO CULTURE (INFECTION SUSPECTED)
Bacteria, UA: NONE SEEN
Bilirubin Urine: NEGATIVE
Glucose, UA: 50 mg/dL — AB
Ketones, ur: NEGATIVE mg/dL
Nitrite: NEGATIVE
Protein, ur: 100 mg/dL — AB
RBC / HPF: >50 RBC/hpf (ref 0–5)
Specific Gravity, Urine: 1.013 (ref 1.005–1.030)
WBC, UA: >50 WBC/hpf (ref 0–5)
pH: 5 (ref 5.0–8.0)

## 2023-03-27 LAB — I-STAT VENOUS BLOOD GAS, ED
Acid-base deficit: 12 mmol/L — ABNORMAL HIGH (ref 0.0–2.0)
Bicarbonate: 14.4 mmol/L — ABNORMAL LOW (ref 20.0–28.0)
Calcium, Ion: 1.09 mmol/L — ABNORMAL LOW (ref 1.15–1.40)
HCT: 26 % — ABNORMAL LOW (ref 36.0–46.0)
Hemoglobin: 8.8 g/dL — ABNORMAL LOW (ref 12.0–15.0)
O2 Saturation: 40 %
Potassium: 5.1 mmol/L (ref 3.5–5.1)
Sodium: 134 mmol/L — ABNORMAL LOW (ref 135–145)
TCO2: 15 mmol/L — ABNORMAL LOW (ref 22–32)
pCO2, Ven: 34.4 mmHg — ABNORMAL LOW (ref 44–60)
pH, Ven: 7.232 — ABNORMAL LOW (ref 7.25–7.43)
pO2, Ven: 27 mmHg — CL (ref 32–45)

## 2023-03-27 LAB — COMPREHENSIVE METABOLIC PANEL
ALT: 32 U/L (ref 0–44)
AST: 74 U/L — ABNORMAL HIGH (ref 15–41)
Albumin: 2.3 g/dL — ABNORMAL LOW (ref 3.5–5.0)
Alkaline Phosphatase: 111 U/L (ref 38–126)
Anion gap: 15 (ref 5–15)
BUN: 97 mg/dL — ABNORMAL HIGH (ref 6–20)
CO2: 14 mmol/L — ABNORMAL LOW (ref 22–32)
Calcium: 8.1 mg/dL — ABNORMAL LOW (ref 8.9–10.3)
Chloride: 104 mmol/L (ref 98–111)
Creatinine, Ser: 5.53 mg/dL — ABNORMAL HIGH (ref 0.44–1.00)
GFR, Estimated: 8 mL/min — ABNORMAL LOW (ref >60)
Glucose, Bld: 317 mg/dL — ABNORMAL HIGH (ref 70–99)
Potassium: 5.1 mmol/L (ref 3.5–5.1)
Sodium: 133 mmol/L — ABNORMAL LOW (ref 135–145)
Total Bilirubin: 0.2 mg/dL — ABNORMAL LOW (ref 0.3–1.2)
Total Protein: 6.8 g/dL (ref 6.5–8.1)

## 2023-03-27 LAB — CBC WITH DIFFERENTIAL/PLATELET
Abs Immature Granulocytes: 0.29 10*3/uL — ABNORMAL HIGH (ref 0.00–0.07)
Basophils Absolute: 0 10*3/uL (ref 0.0–0.1)
Basophils Relative: 0 %
Eosinophils Absolute: 0 10*3/uL (ref 0.0–0.5)
Eosinophils Relative: 1 %
HCT: 26.4 % — ABNORMAL LOW (ref 36.0–46.0)
Hemoglobin: 8.4 g/dL — ABNORMAL LOW (ref 12.0–15.0)
Immature Granulocytes: 5 %
Lymphocytes Relative: 9 %
Lymphs Abs: 0.6 10*3/uL — ABNORMAL LOW (ref 0.7–4.0)
MCH: 28.4 pg (ref 26.0–34.0)
MCHC: 31.8 g/dL (ref 30.0–36.0)
MCV: 89.2 fL (ref 80.0–100.0)
Monocytes Absolute: 0.5 10*3/uL (ref 0.1–1.0)
Monocytes Relative: 8 %
Neutro Abs: 4.9 10*3/uL (ref 1.7–7.7)
Neutrophils Relative %: 77 %
Platelets: 256 10*3/uL (ref 150–400)
RBC: 2.96 MIL/uL — ABNORMAL LOW (ref 3.87–5.11)
RDW: 18.5 % — ABNORMAL HIGH (ref 11.5–15.5)
WBC: 6.3 10*3/uL (ref 4.0–10.5)
nRBC: 0 % (ref 0.0–0.2)

## 2023-03-27 LAB — PROTIME-INR
INR: 1.2 (ref 0.8–1.2)
Prothrombin Time: 15.1 s (ref 11.4–15.2)

## 2023-03-27 LAB — LACTIC ACID, PLASMA
Lactic Acid, Venous: 1.8 mmol/L (ref 0.5–1.9)
Lactic Acid, Venous: 1.4 mmol/L (ref 0.5–1.9)

## 2023-03-27 LAB — BETA-HYDROXYBUTYRIC ACID: Beta-Hydroxybutyric Acid: 0.18 mmol/L (ref 0.05–0.27)

## 2023-03-27 LAB — BASIC METABOLIC PANEL
Anion gap: 13 (ref 5–15)
BUN: 97 mg/dL — ABNORMAL HIGH (ref 6–20)
CO2: 16 mmol/L — ABNORMAL LOW (ref 22–32)
Calcium: 8 mg/dL — ABNORMAL LOW (ref 8.9–10.3)
Chloride: 104 mmol/L (ref 98–111)
Creatinine, Ser: 5.11 mg/dL — ABNORMAL HIGH (ref 0.44–1.00)
GFR, Estimated: 9 mL/min — ABNORMAL LOW (ref >60)
Glucose, Bld: 218 mg/dL — ABNORMAL HIGH (ref 70–99)
Potassium: 4.9 mmol/L (ref 3.5–5.1)
Sodium: 133 mmol/L — ABNORMAL LOW (ref 135–145)

## 2023-03-27 LAB — GLUCOSE, CAPILLARY
Glucose-Capillary: 197 mg/dL — ABNORMAL HIGH (ref 70–99)
Glucose-Capillary: 204 mg/dL — ABNORMAL HIGH (ref 70–99)

## 2023-03-27 LAB — APTT: aPTT: 32 s (ref 24–36)

## 2023-03-27 LAB — CK: Total CK: 2751 U/L — ABNORMAL HIGH (ref 38–234)

## 2023-03-27 SURGERY — INSERTION, INTRAMEDULLARY ROD, FEMUR
Anesthesia: Choice | Laterality: Right

## 2023-03-27 MED ORDER — LACTATED RINGERS IV BOLUS
1000.0000 mL | Freq: Once | INTRAVENOUS | Status: AC
  Administered 2023-03-27: 1000 mL via INTRAVENOUS

## 2023-03-27 MED ORDER — ACETAMINOPHEN 325 MG PO TABS
650.0000 mg | ORAL_TABLET | Freq: Four times a day (QID) | ORAL | Status: DC | PRN
  Administered 2023-03-29: 650 mg via ORAL
  Filled 2023-03-27: qty 2

## 2023-03-27 MED ORDER — DEXTROSE IN LACTATED RINGERS 5 % IV SOLN: INTRAVENOUS | Status: DC

## 2023-03-27 MED ORDER — ONDANSETRON HCL 4 MG/2ML IJ SOLN
4.0000 mg | Freq: Four times a day (QID) | INTRAMUSCULAR | Status: DC | PRN
  Administered 2023-04-05 – 2023-04-08 (×2): 4 mg via INTRAVENOUS
  Filled 2023-03-27 (×2): qty 2

## 2023-03-27 MED ORDER — SODIUM CHLORIDE 0.9% FLUSH
3.0000 mL | Freq: Two times a day (BID) | INTRAVENOUS | Status: DC
  Administered 2023-03-27 – 2023-04-07 (×15): 3 mL via INTRAVENOUS

## 2023-03-27 MED ORDER — OXYBUTYNIN CHLORIDE ER 5 MG PO TB24
10.0000 mg | ORAL_TABLET | Freq: Every day | ORAL | Status: DC
  Administered 2023-03-28 – 2023-04-08 (×12): 10 mg via ORAL
  Filled 2023-03-27 (×12): qty 2

## 2023-03-27 MED ORDER — FENTANYL CITRATE PF 50 MCG/ML IJ SOSY
50.0000 ug | PREFILLED_SYRINGE | Freq: Once | INTRAMUSCULAR | Status: AC
  Administered 2023-03-27: 50 ug via INTRAVENOUS
  Filled 2023-03-27: qty 1

## 2023-03-27 MED ORDER — INSULIN ASPART 100 UNIT/ML IJ SOLN
0.0000 [IU] | Freq: Three times a day (TID) | INTRAMUSCULAR | Status: DC
  Administered 2023-03-28 – 2023-04-07 (×6): 1 [IU] via SUBCUTANEOUS

## 2023-03-27 MED ORDER — INSULIN DETEMIR 100 UNIT/ML ~~LOC~~ SOLN
10.0000 [IU] | Freq: Two times a day (BID) | SUBCUTANEOUS | Status: DC
  Administered 2023-03-27 – 2023-03-29 (×5): 10 [IU] via SUBCUTANEOUS
  Filled 2023-03-27 (×6): qty 0.1

## 2023-03-27 MED ORDER — OXYCODONE HCL 5 MG PO TABS
5.0000 mg | ORAL_TABLET | ORAL | Status: DC | PRN
  Administered 2023-03-27 – 2023-03-31 (×9): 5 mg via ORAL
  Filled 2023-03-27 (×10): qty 1

## 2023-03-27 MED ORDER — STERILE WATER FOR INJECTION IV SOLN
INTRAVENOUS | Status: AC
  Filled 2023-03-27 (×2): qty 150

## 2023-03-27 MED ORDER — INSULIN REGULAR(HUMAN) IN NACL 100-0.9 UT/100ML-% IV SOLN: INTRAVENOUS | Status: DC

## 2023-03-27 MED ORDER — LACTATED RINGERS IV SOLN: INTRAVENOUS | Status: DC

## 2023-03-27 MED ORDER — SENNOSIDES-DOCUSATE SODIUM 8.6-50 MG PO TABS: 1.0000 | ORAL_TABLET | Freq: Every evening | ORAL | Status: DC | PRN

## 2023-03-27 MED ORDER — ONDANSETRON HCL 4 MG PO TABS: 4.0000 mg | ORAL_TABLET | Freq: Four times a day (QID) | ORAL | Status: DC | PRN

## 2023-03-27 MED ORDER — ENOXAPARIN SODIUM 60 MG/0.6ML IJ SOSY
60.0000 mg | PREFILLED_SYRINGE | INTRAMUSCULAR | Status: DC
  Administered 2023-03-27 – 2023-03-29 (×3): 60 mg via SUBCUTANEOUS
  Filled 2023-03-27 (×3): qty 0.6

## 2023-03-27 MED ORDER — GABAPENTIN 300 MG PO CAPS
300.0000 mg | ORAL_CAPSULE | Freq: Every day | ORAL | Status: DC
  Administered 2023-03-27 – 2023-04-07 (×12): 300 mg via ORAL
  Filled 2023-03-27 (×12): qty 1

## 2023-03-27 MED ORDER — DEXTROSE 50 % IV SOLN: 0.0000 mL | INTRAVENOUS | Status: DC | PRN

## 2023-03-27 MED ORDER — HYDROMORPHONE HCL 1 MG/ML IJ SOLN
0.5000 mg | INTRAMUSCULAR | Status: DC | PRN
  Filled 2023-03-27: qty 0.5

## 2023-03-27 MED ORDER — INSULIN ASPART 100 UNIT/ML IJ SOLN
0.0000 [IU] | Freq: Every day | INTRAMUSCULAR | Status: DC
  Administered 2023-03-27: 2 [IU] via SUBCUTANEOUS
  Administered 2023-03-29: 3 [IU] via SUBCUTANEOUS

## 2023-03-27 MED ORDER — ACETAMINOPHEN 650 MG RE SUPP: 650.0000 mg | Freq: Four times a day (QID) | RECTAL | Status: DC | PRN

## 2023-03-27 MED ORDER — LACTATED RINGERS IV BOLUS
500.0000 mL | Freq: Once | INTRAVENOUS | Status: AC
  Administered 2023-03-27: 500 mL via INTRAVENOUS

## 2023-03-27 MED ORDER — VENLAFAXINE HCL ER 37.5 MG PO CP24
37.5000 mg | ORAL_CAPSULE | Freq: Every day | ORAL | Status: DC
  Administered 2023-03-28 – 2023-03-31 (×4): 37.5 mg via ORAL
  Filled 2023-03-27 (×4): qty 1

## 2023-03-27 NOTE — ED Notes (Signed)
Called and gave report to [place]. Also gave report to [facility]

## 2023-03-27 NOTE — H&P (Signed)
History and Physical    Bijan Garciaramirez NUU:[id number] DOB: 04/09/1966 DOA: 03/27/2023  PCP: Karie Georges, MD   Patient coming from: Home   Chief Complaint: General weakness, fatigue, pain   HPI: Jaime Gomez is a pleasant 57 y.o. 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  Patient was having progressive dyspnea likely due to her pulmonary metastases and was recently started on supplemental oxygen.  She was also recently started on treatment for UTI.  She reports that she has is not experiencing any dysuria, fever, chills, vomiting, or diarrhea, but has been progressively weak in general with increasing fatigue and worsening cancer related pain.  Her pain does improve with oxycodone at home.  Her sister at the bedside reports that the patient has had worsening appetite.  [facility] ED Course: Upon arrival to the ED, patient is found to be afebrile and saturating well on 2 L/min of supplemental oxygen with mild tachycardia and systolic blood pressure is low 75.  EKG demonstrates sinus rhythm with nonspecific IVCD.  Chest x-ray notable for stable perihilar nodular opacities.  Labs are most notable for BUN 97, creatinine 5.53, serum bicarbonate 14, glucose 317, CK 2751, normal lactic acid, normal WBC, and hemoglobin 8.4.  Blood cultures were collected in the ED and the patient was given 1.5 L of LR, fentanyl, and IV insulin.  She was transferred to [facility] for admission.  Review of Systems:  All other systems reviewed and apart from HPI, are negative.  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 failure 08/18/2021   Depression    Diabetes  mellitus without complication (HCC) type 2    Endometrial cancer, FIGO stage IVB (HCC) 08/18/2021   Goals of care, counseling/discussion 08/18/2021   History of kidney stones    Hypertension    Leg DVT (deep venous thromboembolism), chronic, right (HCC) 08/18/2021   Ovarian cancer (HCC) 2013   Pneumonia 12/03/2022   Stroke (HCC)    Wears glasses     Past Surgical History:  Procedure Laterality Date   ABDOMINAL HYSTERECTOMY     BOTOX INJECTION N/A 01/12/2023   Procedure: BOTOX INJECTION INTO BLADDER;  Surgeon: Jannifer Hick, MD;  Location: [facility];  Service: Urology;  Laterality: N/A;   CYSTOSCOPY W/ RETROGRADES Right 01/12/2023   Procedure: CYSTOSCOPY WITH RIGHT  RETROGRADE PYELOGRAM/ RIGHT URETERAL STENT CHANGE;  Surgeon: Jannifer Hick, MD;  Location: [facility];  Service: Urology;  Laterality: Right;   CYSTOSCOPY W/ URETERAL STENT PLACEMENT Bilateral 09/01/2021   Procedure: CYSTOSCOPY WITH RETROGRADE PYELOGRAM/URETERAL STENT PLACEMENT;  Surgeon: Jannifer Hick, MD;  Location: [facility];  Service: Urology;  Laterality: Bilateral;   CYSTOSCOPY WITH FULGERATION N/A 01/12/2023   Procedure: CYSTOSCOPY WITH FULGERATION;  Surgeon: Jannifer Hick, MD;  Location: [facility];  Service: Urology;  Laterality: N/A;   CYSTOSCOPY WITH STENT PLACEMENT Right 05/19/2022   Procedure: CYSTOSCOPY WITH STENT CHANGE;  Surgeon: Jannifer Hick, MD;  Location: [facility];  Service: Urology;  Laterality: Right;  ONLY NEEDS 30 MIN   HERNIA REPAIR     IR CONVERT LEFT NEPHROSTOMY TO NEPHROURETERAL CATH  11/02/2021   IR CONVERT LEFT NEPHROSTOMY TO NEPHROURETERAL CATH  01/09/2023   IR CV LINE INJECTION  08/23/2021   IR DIL URETER LEFT  01/09/2023   IR IMAGING GUIDED PORT INSERTION  11/14/2021   IR IMAGING GUIDED PORT INSERTION  01/09/2023   IR NEPHROSTOMY EXCHANGE LEFT  11/02/2021   IR NEPHROSTOMY EXCHANGE LEFT  01/20/2022   IR NEPHROSTOMY EXCHANGE LEFT  11/29/2022   IR  NEPHROSTOMY PLACEMENT LEFT  09/03/2021   IR REMOVAL TUN ACCESS W/ PORT W/O FL MOD SED  11/14/2021   IR REMOVAL TUN ACCESS W/ PORT W/O FL MOD SED  12/04/2022   IR URETERAL STENT PLACEMENT EXISTING ACCESS LEFT  02/28/2023    Social History:   reports that she has never smoked. She has never used smokeless tobacco. She reports that she does not drink alcohol and does not use drugs.  Allergies  Allergen Reactions   Paclitaxel Anaphylaxis, Swelling and Other (See Comments)    Made everything swell   Taxotere [Docetaxel] Anaphylaxis, Swelling and Other (See Comments)    Throat swelling    Family History  Problem Relation Age of Onset   Hyperlipidemia Mother    Heart disease Mother    Diabetes Mother    Depression Mother    COPD Mother    Arthritis Mother    Heart attack Mother    Drug abuse Father    Alcohol abuse Father    Hyperlipidemia Sister    Heart disease Sister    Diabetes Sister    Arthritis Sister    Miscarriages / Stillbirths Sister    Depression Brother      Prior to Admission medications   Medication Sig Start Date End Date Taking? Authorizing Provider  acetaminophen (TYLENOL) 500 MG tablet Take 500 mg by mouth every 6 (six) hours as needed for mild pain.    [provider]  amLODipine (NORVASC) 2.5 MG tablet Take 1 tablet (2.5 mg total) by mouth daily. 03/01/23   Karie Georges, MD  amoxicillin-clavulanate (AUGMENTIN) 500-125 MG tablet Take 1 tablet by mouth daily for 7 days. 03/26/23 04/02/23  Gloris Manchester, MD  carvedilol (COREG) 12.5 MG tablet TAKE 1 TABLET (12.5MG  TOTAL) BY MOUTH TWICE A DAY WITH MEALS 02/19/23   [name], MD  Continuous Glucose Sensor (FREESTYLE LIBRE 3 SENSOR) MISC 1 Act by Does not apply route daily. Place 1 sensor on the skin every 14 days. Use to check glucose continuously 12/26/22   Karie Georges, MD  dexamethasone (DECADRON) 4 MG tablet Take 1 tablet (4 mg total) by mouth daily. Patient not taking: Reported on 03/20/2023  02/11/23   Standley Brooking, MD  enoxaparin (LOVENOX) 60 MG/0.6ML injection Inject 0.6 mLs (60 mg total) into the skin daily. 02/10/23 05/11/23  Standley Brooking, MD  famotidine (PEPCID) 20 MG tablet Take 20 mg by mouth 2 (two) times daily as needed for heartburn or indigestion.    [provider]  furosemide (LASIX) 20 MG tablet TAKE 1 TABLET BY MOUTH EVERY DAY Patient taking differently: Take 20 mg by mouth daily as needed for fluid or edema. 01/22/23   Karie Georges, MD  gabapentin (NEURONTIN) 300 MG capsule Take 1 capsule (300 mg total) by mouth 3 (three) times daily. 02/14/23   [name], MD  insulin aspart (NOVOLOG) 100 UNIT/ML injection Inject 0-10 Units into the skin 3 (three) times daily before meals.    [provider]  insulin detemir (LEVEMIR FLEXPEN) 100 UNIT/ML FlexPen INJECT 0.2MLS (20 UNITS) INTO THE SKIN TWICE A DAY  03/22/23   [name], MD  insulin detemir (LEVEMIR) 100 UNIT/ML injection Inject 0.2 mLs (20 Units total) into the skin 2 (two) times daily. Patient taking differently: Inject 10-20 Units into the skin See admin instructions. Inject 10-20 units into the skin two times a day before meals, per sliding scale 08/30/22   Ennever, Rose Phi, MD  lidocaine (LIDODERM) 5 % Place 1 patch onto the skin daily. Remove & Discard patch within 12 hours or as directed by MD 03/09/23   [name], MD  oxybutynin (DITROPAN-XL) 10 MG 24 hr tablet Take 1 tablet (10 mg total) by mouth daily. 03/01/23   Karie Georges, MD  oxycodone (OXY-IR) 5 MG capsule Take 1 capsule (5 mg total) by mouth every 4 (four) hours as needed for pain. Patient not taking: Reported on 03/27/2023 12/13/22   [name], MD  polyethylene glycol (MIRALAX / GLYCOLAX) 17 g packet Take 17 g by mouth daily as needed for mild constipation. Patient not taking: Reported on 03/27/2023 09/05/21   Joycelyn Das, MD  prochlorperazine (COMPAZINE) 10 MG tablet Take 10 mg by mouth every 6 (six)  hours as needed for nausea or vomiting. Patient not taking: Reported on 03/27/2023 08/18/21   [provider]  sodium bicarbonate 650 MG tablet Take 1 tablet (650 mg total) by mouth 3 (three) times daily. Patient taking differently: Take 650 mg by mouth 2 (two) times daily. 02/10/23   Standley Brooking, MD  venlafaxine XR (EFFEXOR XR) 37.5 MG 24 hr capsule Take 1 capsule (37.5 mg total) by mouth daily with breakfast. 12/26/22   Karie Georges, MD    Physical Exam: Vitals:   03/27/23 1530 03/27/23 1545 03/27/23 1600 03/27/23 1727  BP: (!) 89/64 (!) 88/64 (!) 88/69 107/69  Pulse: 90 88 85 85  Resp: 13 13 16 14   Temp:    97.8 F (36.6 C)  TempSrc:    Oral  SpO2: 96% 97% 97% 97%  Weight:      Height:        Constitutional: NAD, calm  Eyes: PERTLA, lids and conjunctivae normal ENMT: Mucous membranes are moist. Posterior pharynx clear of any exudate or lesions.   Neck: supple, no masses  Respiratory: no wheezing, no crackles. No accessory muscle use.  Cardiovascular: S1 & S2 heard, regular rate and rhythm. No extremity edema.  Abdomen: Soft. Bowel sounds active.  Musculoskeletal: no clubbing / cyanosis. No joint deformity upper and lower extremities.   Skin: no significant rashes, lesions, ulcers. Warm, dry, well-perfused. Neurologic: CN 2-12 grossly intact. Moving all extremities. Alert and oriented.  Psychiatric: Pleasant. Cooperative.    Labs and Imaging on Admission: I have personally reviewed following labs and imaging studies  CBC: Recent Labs  Lab 03/23/23 0725 03/26/23 0300 03/26/23 0324 03/27/23 1245 03/27/23 1253  WBC 5.2 5.4  --  6.3  --   NEUTROABS 4.1  --   --  4.9  --   HGB 7.6* 7.7* 7.5* 8.4* 8.8*  HCT 23.4* 24.9* 22.0* 26.4* 26.0*  MCV 93.2 90.5  --  89.2  --   PLT 198 181  --  256  --    Basic Metabolic Panel: Recent Labs  Lab 03/23/23 0725 03/26/23 0300 03/26/23 0324 03/27/23 1245 03/27/23 1253  NA 131* 131* 133* 133* 134*  K 5.2* 4.5  4.7 5.1 5.1  CL 104 103 106 104  --   CO2 14* 15*  --  14*  --  GLUCOSE 228* 203* 199* 317*  --   BUN 90* 99* 113* 97*  --   CREATININE 4.97* 5.19* 5.70* 5.53*  --   CALCIUM 8.3* 7.9*  --  8.1*  --    GFR: Estimated Creatinine Clearance: 9.6 mL/min (A) (by C-G formula based on SCr of 5.53 mg/dL (H)). Liver Function Tests: Recent Labs  Lab 03/23/23 0725 03/26/23 0300 03/27/23 1245  AST 13* 62* 74*  ALT 9 17 32  ALKPHOS 122 114 111  BILITOT 0.4 0.2* 0.2*  PROT 6.7 6.3* 6.8  ALBUMIN 2.4* 2.1* 2.3*   No results for input(s): "LIPASE", "AMYLASE" in the last 168 hours. No results for input(s): "AMMONIA" in the last 168 hours. Coagulation Profile: Recent Labs  Lab 03/27/23 1258  INR 1.2   Cardiac Enzymes: Recent Labs  Lab 03/26/23 0300 03/27/23 1330  CKTOTAL 3,764* 2,751*   BNP (last 3 results) No results for input(s): "PROBNP" in the last 8760 hours. HbA1C: No results for input(s): "HGBA1C" in the last 72 hours. CBG: Recent Labs  Lab 03/27/23 1758  GLUCAP 197*   Lipid Profile: No results for input(s): "CHOL", "HDL", "LDLCALC", "TRIG", "CHOLHDL", "LDLDIRECT" in the last 72 hours. Thyroid Function Tests: No results for input(s): "TSH", "T4TOTAL", "FREET4", "T3FREE", "THYROIDAB" in the last 72 hours. Anemia Panel: No results for input(s): "VITAMINB12", "FOLATE", "FERRITIN", "TIBC", "IRON", "RETICCTPCT" in the last 72 hours. Urine analysis:    Component Value Date/Time   COLORURINE YELLOW 03/23/2023 1045   APPEARANCEUR CLEAR 03/23/2023 1045   LABSPEC 1.025 03/23/2023 1045   PHURINE 5.0 03/23/2023 1045   GLUCOSEU NEGATIVE 03/23/2023 1045   HGBUR LARGE (A) 03/23/2023 1045   BILIRUBINUR NEGATIVE 03/23/2023 1045   KETONESUR NEGATIVE 03/23/2023 1045   PROTEINUR >=300 (A) 03/23/2023 1045   NITRITE POSITIVE (A) 03/23/2023 1045   LEUKOCYTESUR LARGE (A) 03/23/2023 1045   Sepsis Labs: @LABRCNTIP (procalcitonin:4,lacticidven:4) ) Recent Results (from the past 240  hour(s))  SARS Coronavirus 2 by RT PCR (hospital order, performed in [facility] hospital lab) *cepheid single result test* Anterior Nasal Swab     Status: None   Collection Time: 03/23/23  7:26 AM   Specimen: Anterior Nasal Swab  Result Value Ref Range Status   SARS Coronavirus 2 by RT PCR NEGATIVE NEGATIVE Final    Comment: (NOTE) SARS-CoV-2 target nucleic acids are NOT DETECTED.  The SARS-CoV-2 RNA is generally detectable in upper and lower respiratory specimens during the acute phase of infection. The lowest concentration of SARS-CoV-2 viral copies this assay can detect is 250 copies / mL. A negative result does not preclude SARS-CoV-2 infection and should not be used as the sole basis for treatment or other patient management decisions.  A negative result may occur with improper specimen collection / handling, submission of specimen other than nasopharyngeal swab, presence of viral mutation(s) within the areas targeted by this assay, and inadequate number of viral copies (<250 copies / mL). A negative result must be combined with clinical observations, patient history, and epidemiological information.  Fact Sheet for Patients:   RoadLapTop.co.za  Fact Sheet for Healthcare Providers: http://kim-miller.com/  This test is not yet approved or  cleared by the [place] FDA and has been authorized for detection and/or diagnosis of SARS-CoV-2 by FDA under an Emergency Use Authorization (EUA).  This EUA will remain in effect (meaning this test can be used) for the duration of the COVID-19 declaration under Section 564(b)(1) of the Act, 21 U.S.C. section 360bbb-3(b)(1), unless the authorization is terminated or revoked  sooner.  Performed at [facility], [address] [place], [place] [address]   Blood culture (routine x 2)     Status: None (Preliminary result)   Collection Time: 03/23/23  7:40 AM   Specimen: BLOOD   Result Value Ref Range Status   Specimen Description   Final    BLOOD LEFT ANTECUBITAL Performed at [facility], [address] [place], [place] [address]    Special Requests   Final    BOTTLES DRAWN AEROBIC AND ANAEROBIC Blood Culture results may not be optimal due to an inadequate volume of blood received in culture bottles Performed at [facility], [address] [place], [place] [address]    Culture   Final    NO GROWTH 4 DAYS Performed at [facility], [address] [place], [place] [address]    Report Status PENDING  Incomplete  Blood culture (routine x 2)     Status: None (Preliminary result)   Collection Time: 03/23/23  7:55 AM   Specimen: BLOOD  Result Value Ref Range Status   Specimen Description   Final    BLOOD RIGHT ANTECUBITAL Performed at [facility], [address] [place], [place] [address]    Special Requests   Final    BOTTLES DRAWN AEROBIC AND ANAEROBIC Blood Culture adequate volume Performed at [facility], [address] [place], [place] [address]    Culture   Final    NO GROWTH 4 DAYS Performed at [facility], [address] [place], [place] [address]    Report Status PENDING  Incomplete  Urine Culture     Status: Abnormal   Collection Time: 03/23/23 10:45 AM   Specimen: Urine, Random  Result Value Ref Range Status   Specimen Description   Final    URINE, RANDOM Performed at [facility], [address] [place], [place] [address]    Special Requests   Final    NONE Reflexed from [id number] Performed at [facility], [address] [place], [place] [address]    Culture (A)  Final    50,000 COLONIES/mL ENTEROCOCCUS FAECALIS 50,000 COLONIES/mL DIPHTHEROIDS(CORYNEBACTERIUM SPECIES) Standardized susceptibility testing for this organism is not available. Performed at [facility], [address], [place], [place] [address]    Report Status 03/25/2023 FINAL   Final   Organism ID, Bacteria ENTEROCOCCUS FAECALIS (A)  Final      Susceptibility   Enterococcus faecalis - MIC*    AMPICILLIN <=2 SENSITIVE Sensitive     NITROFURANTOIN <=16 SENSITIVE Sensitive     VANCOMYCIN 2 SENSITIVE Sensitive     * 50,000 COLONIES/mL ENTEROCOCCUS FAECALIS     Radiological Exams on Admission: DG Chest Port 1 View  Result Date: 03/27/2023 CLINICAL DATA:  Sepsis.  Fall yesterday. EXAM: PORTABLE CHEST 1 VIEW COMPARISON:  X-ray 02/20/2023. CT scan chest abdomen pelvis 03/26/2023 FINDINGS: Right IJ chest port with tip at the central SVC. Underinflation. No pneumothorax, effusion. Overlapping cardiac leads. Stable cardiopericardial silhouette. Perihilar nodular contour opacities are once again identified, similar to the previous CT scan. Mild adjacent ground-glass interstitial type changes. IMPRESSION: Stable perihilar nodular opacities. Please correlate with the prior CT. No pneumothorax or effusion. Chest port Electronically Signed   By: Karen Kays M.D.   On: 03/27/2023 14:52   CT CHEST ABDOMEN PELVIS WO CONTRAST  Result Date: 03/26/2023 CLINICAL DATA:  Polytrauma, blunt. Fall. Metastatic endometrial cancer with bone metastasis. EXAM: CT CHEST, ABDOMEN AND PELVIS WITHOUT CONTRAST TECHNIQUE: Multidetector CT imaging of the chest, abdomen and pelvis was performed following the standard protocol without IV contrast. RADIATION DOSE REDUCTION: This exam was performed according to the departmental dose-optimization program which includes automated exposure control, adjustment of the mA and/or kV according to patient size and/or use of iterative reconstruction technique. COMPARISON:  03/23/2023. FINDINGS: CT CHEST FINDINGS Cardiovascular: Heart is normal in size and there is a trace pericardial effusion. Multi-vessel coronary artery calcifications are noted. A right chest port terminates at the cavoatrial junction. There is atherosclerotic calcification of the aorta without evidence of  aneurysm. The pulmonary trunk is normal in caliber. Mediastinum/Nodes: No mediastinal or axillary lymphadenopathy. Evaluation of the hila is limited due to lack of IV contrast. The thyroid gland, trachea, and esophagus are within normal limits. Lungs/Pleura: Increased scattered ground-glass opacities are noted in the right lung. Stable ground-glass opacities are present in the left lung. There is redemonstration of masses in the right upper lobe measuring 4.5 x 3.9 cm, not significantly changed when differences in technique are taken into consideration. There is a mass in the right lower lobe measuring 6.0 x 4.9 cm, axial image 90. There is a mass in the left upper lobe measuring 5.4 x 4.9 cm, axial image 85. A stable 1.2 cm nodule is present in the right lower lobe seen within a cavitary structure, axial image 111. Additional smaller nodular opacities are seen bilaterally and unchanged. No effusion or pneumothorax is seen. Musculoskeletal: Degenerative changes are present in the thoracic spine. No acute fracture is seen. A stable lytic region is noted in the medial border of the left scapula. There is a lytic lesion in the tenth rib on the left, unchanged. CT ABDOMEN PELVIS FINDINGS Hepatobiliary: No focal liver abnormality is seen. A stone is present within the gallbladder. No biliary ductal dilatation. Pancreas: Unremarkable. No pancreatic ductal dilatation or surrounding inflammatory changes. Spleen: Normal in size without focal abnormality. Adrenals/Urinary Tract: No adrenal nodule or mass. Left renal atrophy is noted. Renal calculi are noted bilaterally. There is a left ureteral stent in place without evidence of obstructive uropathy. There is a right ureteral stent in place with mild hydroureteronephrosis. No obstructing stone is seen. The bladder is unremarkable. Stomach/Bowel: Stomach is within normal limits. Appendix appears normal. No evidence of bowel wall thickening, distention, or inflammatory changes.  No free air or pneumatosis. Scattered diverticula are present along the colon without evidence of diverticulitis. Vascular/Lymphatic: An IVC filter is noted. Aortic atherosclerosis. No abdominal or pelvic lymphadenopathy. Reproductive: Status post hysterectomy. No adnexal masses. Other: No abdominopelvic ascites. Musculoskeletal: Degenerative changes are present in the lumbar spine. No acute osseous abnormality is seen. IMPRESSION: 1. Stable lytic lesions in the bones without evidence of acute fracture. 2. Bilateral pulmonary masses and nodules, not significantly changed when differences in technique are taken into consideration. Increased ground-glass opacities are present in the right lung and stable ground-glass opacities are noted in the left lung, possibly representing metastatic disease. Clinical correlation is recommended to exclude superimposed infection. 3. Cavitary lesion with nodules in the right lower lobe, suspicious for bronchogenic carcinoma. 4. Bilateral nephrolithiasis with ureteral stents in place. There is mild-to-moderate hydroureteronephrosis on the right with no evidence of obstructing stone. Electronically Signed   By: Thornell Sartorius M.D.   On: 03/26/2023 04:19   CT L-SPINE NO CHARGE  Result Date: 03/26/2023 CLINICAL DATA:  fall. Pt. Is a cancer  patient, with mets to the bone found recently. Pt. Did not lose consciousness or hit her head. She does take blood thinners. Pt. C/o L side pain. EXAM: CT THORACIC AND LUMBAR SPINE WITHOUT CONTRAST TECHNIQUE: Multidetector CT imaging of the thoracic and lumbar spine was performed without contrast. Multiplanar CT image reconstructions were also generated. RADIATION DOSE REDUCTION: This exam was performed according to the departmental dose-optimization program which includes automated exposure control, adjustment of the mA and/or kV according to patient size and/or use of iterative reconstruction technique. COMPARISON:  None Available. FINDINGS: CT  THORACIC SPINE FINDINGS Alignment: Normal. Vertebrae: Multilevel mild-to-moderate degenerative changes of the spine. No associated severe osseous neural foraminal or central canal stenosis. Lytic lesion of the T12 left inferior articular facet and lamina with extension to the spinous process. Destruction of the cortex. Paraspinal and other soft tissues: Negative. Disc levels: Mild multilevel intervertebral disc space narrowing. CT LUMBAR SPINE FINDINGS Segmentation: 5 lumbar type vertebrae. Alignment: Normal. Vertebrae: L5-S1 posterior disc osteophyte complex formation. No associated severe osseous neural foraminal or central canal stenosis. No acute fracture or focal pathologic process. Paraspinal and other soft tissues: Negative. Disc levels: Maintained. Other: Atherosclerotic plaque. Right chest wall Port-A-Cath. Inferior vena cava filter. Bilateral ureteral stents. Question lytic lesion of the S5 level. Please see separately dictated CT chest abdomen pelvis 03/26/2023. IMPRESSION: CT THORACIC SPINE IMPRESSION 1. Lytic lesion of the T12 left inferior articular facet and lamina with extension to the spinous process. Destruction of the cortex. Finding consistent with metastatic disease as noted in the history. 2. No acute displaced fracture or traumatic listhesis of the thoracic spine. CT LUMBAR SPINE IMPRESSION 1. No acute displaced fracture or traumatic listhesis of the lumbar spine. Other imaging findings of potential clinical significance: 1. Lung disease. 2. Question lytic lesion of the S5 level. 3. Aortic Atherosclerosis (ICD10-I70.0). 4. Please see separately dictated CT chest abdomen pelvis 03/26/2023. Electronically Signed   By: Tish Frederickson M.D.   On: 03/26/2023 04:06   CT T-SPINE NO CHARGE  Result Date: 03/26/2023 CLINICAL DATA:  fall. Pt. Is a cancer patient, with mets to the bone found recently. Pt. Did not lose consciousness or hit her head. She does take blood thinners. Pt. C/o L side pain.  EXAM: CT THORACIC AND LUMBAR SPINE WITHOUT CONTRAST TECHNIQUE: Multidetector CT imaging of the thoracic and lumbar spine was performed without contrast. Multiplanar CT image reconstructions were also generated. RADIATION DOSE REDUCTION: This exam was performed according to the departmental dose-optimization program which includes automated exposure control, adjustment of the mA and/or kV according to patient size and/or use of iterative reconstruction technique. COMPARISON:  None Available. FINDINGS: CT THORACIC SPINE FINDINGS Alignment: Normal. Vertebrae: Multilevel mild-to-moderate degenerative changes of the spine. No associated severe osseous neural foraminal or central canal stenosis. Lytic lesion of the T12 left inferior articular facet and lamina with extension to the spinous process. Destruction of the cortex. Paraspinal and other soft tissues: Negative. Disc levels: Mild multilevel intervertebral disc space narrowing. CT LUMBAR SPINE FINDINGS Segmentation: 5 lumbar type vertebrae. Alignment: Normal. Vertebrae: L5-S1 posterior disc osteophyte complex formation. No associated severe osseous neural foraminal or central canal stenosis. No acute fracture or focal pathologic process. Paraspinal and other soft tissues: Negative. Disc levels: Maintained. Other: Atherosclerotic plaque. Right chest wall Port-A-Cath. Inferior vena cava filter. Bilateral ureteral stents. Question lytic lesion of the S5 level. Please see separately dictated CT chest abdomen pelvis 03/26/2023. IMPRESSION: CT THORACIC SPINE IMPRESSION 1. Lytic lesion of the T12 left inferior  articular facet and lamina with extension to the spinous process. Destruction of the cortex. Finding consistent with metastatic disease as noted in the history. 2. No acute displaced fracture or traumatic listhesis of the thoracic spine. CT LUMBAR SPINE IMPRESSION 1. No acute displaced fracture or traumatic listhesis of the lumbar spine. Other imaging findings of  potential clinical significance: 1. Lung disease. 2. Question lytic lesion of the S5 level. 3. Aortic Atherosclerosis (ICD10-I70.0). 4. Please see separately dictated CT chest abdomen pelvis 03/26/2023. Electronically Signed   By: Tish Frederickson M.D.   On: 03/26/2023 04:06   CT HEAD WO CONTRAST  Result Date: 03/26/2023 CLINICAL DATA:  Head trauma, moderate-severe; Polytrauma, blunt EXAM: CT HEAD WITHOUT CONTRAST CT CERVICAL SPINE WITHOUT CONTRAST TECHNIQUE: Multidetector CT imaging of the head and cervical spine was performed following the standard protocol without intravenous contrast. Multiplanar CT image reconstructions of the cervical spine were also generated. RADIATION DOSE REDUCTION: This exam was performed according to the departmental dose-optimization program which includes automated exposure control, adjustment of the mA and/or kV according to patient size and/or use of iterative reconstruction technique. COMPARISON:  CT head 02/20/2023 FINDINGS: CT HEAD FINDINGS Brain: Patchy and confluent areas of decreased attenuation are noted throughout the deep and periventricular white matter of the cerebral hemispheres bilaterally, compatible with chronic microvascular ischemic disease. Left parietal central semiovale encephalomalacia. No evidence of large-territorial acute infarction. No parenchymal hemorrhage. No mass lesion. No extra-axial collection. No mass effect or midline shift. No hydrocephalus. Basilar cisterns are patent. Vascular: No hyperdense vessel. Atherosclerotic calcifications are present within the cavernous internal carotid and vertebral arteries. Skull: No acute fracture or focal lesion. Sinuses/Orbits: Paranasal sinuses and mastoid air cells are clear. The orbits are unremarkable. Other: None. CT CERVICAL SPINE FINDINGS Alignment: Normal. Skull base and vertebrae: Multilevel mild-to-moderate degenerative changes of the spine with posterior disc osteophyte complex formation at the C3-C4  and C5-C6 levels. No associated severe osseous neural foraminal or central canal stenosis. No acute fracture. No aggressive appearing focal osseous lesion or focal pathologic process. Soft tissues and spinal canal: No prevertebral fluid or swelling. No visible canal hematoma. Upper chest: Biapical ground-glass and consolidative peribronchovascular airspace opacities. No apical pneumothoraces. Other: Partially visualized right internal jugular central venous catheter. IMPRESSION: 1. No acute intracranial abnormality. 2. No acute displaced fracture or traumatic listhesis of the cervical spine. 3. Biapical ground-glass and consolidative peribronchovascular airspace opacities. Please see separately dictated CT chest 03/26/2023. Electronically Signed   By: Tish Frederickson M.D.   On: 03/26/2023 03:57   CT CERVICAL SPINE WO CONTRAST  Result Date: 03/26/2023 CLINICAL DATA:  Head trauma, moderate-severe; Polytrauma, blunt EXAM: CT HEAD WITHOUT CONTRAST CT CERVICAL SPINE WITHOUT CONTRAST TECHNIQUE: Multidetector CT imaging of the head and cervical spine was performed following the standard protocol without intravenous contrast. Multiplanar CT image reconstructions of the cervical spine were also generated. RADIATION DOSE REDUCTION: This exam was performed according to the departmental dose-optimization program which includes automated exposure control, adjustment of the mA and/or kV according to patient size and/or use of iterative reconstruction technique. COMPARISON:  CT head 02/20/2023 FINDINGS: CT HEAD FINDINGS Brain: Patchy and confluent areas of decreased attenuation are noted throughout the deep and periventricular white matter of the cerebral hemispheres bilaterally, compatible with chronic microvascular ischemic disease. Left parietal central semiovale encephalomalacia. No evidence of large-territorial acute infarction. No parenchymal hemorrhage. No mass lesion. No extra-axial collection. No mass effect or midline  shift. No hydrocephalus. Basilar cisterns are patent. Vascular: No hyperdense vessel.  Atherosclerotic calcifications are present within the cavernous internal carotid and vertebral arteries. Skull: No acute fracture or focal lesion. Sinuses/Orbits: Paranasal sinuses and mastoid air cells are clear. The orbits are unremarkable. Other: None. CT CERVICAL SPINE FINDINGS Alignment: Normal. Skull base and vertebrae: Multilevel mild-to-moderate degenerative changes of the spine with posterior disc osteophyte complex formation at the C3-C4 and C5-C6 levels. No associated severe osseous neural foraminal or central canal stenosis. No acute fracture. No aggressive appearing focal osseous lesion or focal pathologic process. Soft tissues and spinal canal: No prevertebral fluid or swelling. No visible canal hematoma. Upper chest: Biapical ground-glass and consolidative peribronchovascular airspace opacities. No apical pneumothoraces. Other: Partially visualized right internal jugular central venous catheter. IMPRESSION: 1. No acute intracranial abnormality. 2. No acute displaced fracture or traumatic listhesis of the cervical spine. 3. Biapical ground-glass and consolidative peribronchovascular airspace opacities. Please see separately dictated CT chest 03/26/2023. Electronically Signed   By: Tish Frederickson M.D.   On: 03/26/2023 03:57   DG Shoulder Right  Result Date: 03/26/2023 CLINICAL DATA:  fall EXAM: RIGHT SHOULDER - 2+ VIEW COMPARISON:  CT chest 03/26/2023 FINDINGS: There is no evidence of fracture or dislocation. There is no evidence of arthropathy or other focal bone abnormality. Soft tissues are unremarkable. Right chest wall accessed dialysis catheter with tip overlying the right atrium. Diffuse peribronchovascular central airspace and interstitial opacities. IMPRESSION: 1. Diffuse peribronchovascular central airspace and interstitial opacities. Please see separately dictated CT chest 03/26/2023. 2.  No acute  displaced fracture or dislocation. Electronically Signed   By: Tish Frederickson M.D.   On: 03/26/2023 03:53    EKG: Independently reviewed. Sinus rhythm, non-specific IVCD.   Assessment/Plan   1. AKI superimposed on CKD IV; rhabdomyolysis; metabolic acidosis  - Hydrate with isotonic bicarbonate infusion, renally-dose medications, repeat chem panel in am    2. Endometrial cancer with metastases  - Has received multiple different treatments under the care of Dr. Myna Hidalgo, now wants to focus on quality of life and asks that we consult Palliative Care this admission  - Consult Palliative Care, notify Dr. Myna Hidalgo of admission    3. Hx of DVT and PE  - Continue Lovenox    4. Insulin-dependent DM  - A1c was 7.7% in August 2024  - Check CBGs and continue long- and short-acting insulin   5. Hypertension  - BP was low in ED and antihypertensives held on admission   6. Depression  - Continue Effexor     DVT prophylaxis: Treatment dose Lovenox  Code Status: DNR/DNI  Level of Care: Level of care: Progressive Family Communication: Sister at bedside  Disposition Plan:  Patient is from: home  Anticipated d/c is to: TBD Anticipated d/c date is: 03/30/23  Patient currently: Pending improved/stable renal function  Consults called: None  Admission status: Inpatient     Briscoe Deutscher, MD [facility]  03/27/2023, 6:30 PM

## 2023-03-27 NOTE — ED Triage Notes (Addendum)
Pt fell yesterday and  today went for infusion and just doesn't feel well , pt is very weak , sent for further tx started on O 2 2 days ago

## 2023-03-27 NOTE — Plan of Care (Signed)
  Problem: Education: Goal: Knowledge of General Education information will improve Description: Including pain rating scale, medication(s)/side effects and non-pharmacologic comfort measures Outcome: Completed/Met

## 2023-03-27 NOTE — ED Provider Notes (Signed)
[place] [facility] Provider Note   CSN: [id number] Arrival date & time: 03/27/23  1139     History  Chief Complaint  Patient presents with   Jaime Gomez    Jaime Gomez is a 57 y.o. female.  Patient is a 57 year old who presents with general weakness and shortness of breath.  She has a history of metastatic endometrial carcinoma with bony mets.  She has been having some progressive weakness and pain over the last several days.  She has been seen twice in the ED recently.  She was seen 3 days ago after an associated fall.  She was noted to have an AKI, urinary tract infection and anemia.  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  Was started on home oxygen and Omnicef.  She declined admission at that point and went to Dr. Gustavo Lah office for a transfusion of blood.  She was back in the ED yesterday and had worsening renal function with some evidence of rhabdomyolysis.  She had a CT chest abdomen pelvis which did show some progressive lung disease with a cavitary lesion in the right lung.  She also has a lytic lesion in T12.  No other acute findings.  Goals of care have been discussed with the patient and at that time, she wanted to focus on more pain control and palliative care and did not want to be admitted to the hospital.  Today she was feeling more weak.  She has pain across her chest which is similar to what she has had but worsening.  She has some associated abdominal pain.  She has some back pain.  She does not have any obvious source of bleeding.  She does not have any ongoing urinary symptoms.           Home Medications Prior to Admission medications   Medication Sig Start Date End Date Taking? Authorizing Provider  acetaminophen (TYLENOL) 500 MG tablet Take 500 mg by mouth every 6 (six) hours as needed for mild pain.    [provider]  amLODipine (NORVASC) 2.5 MG tablet Take 1 tablet (2.5 mg total) by  mouth daily. 03/01/23   Karie Georges, MD  amoxicillin-clavulanate (AUGMENTIN) 500-125 MG tablet Take 1 tablet by mouth daily for 7 days. 03/26/23 04/02/23  Gloris Manchester, MD  carvedilol (COREG) 12.5 MG tablet TAKE 1 TABLET (12.5MG  TOTAL) BY MOUTH TWICE A DAY WITH MEALS 02/19/23   [name], MD  Continuous Glucose Sensor (FREESTYLE LIBRE 3 SENSOR) MISC 1 Act by Does not apply route daily. Place 1 sensor on the skin every 14 days. Use to check glucose continuously 12/26/22   Karie Georges, MD  dexamethasone (DECADRON) 4 MG tablet Take 1 tablet (4 mg total) by mouth daily. Patient not taking: Reported on 03/20/2023 02/11/23   Standley Brooking, MD  enoxaparin (LOVENOX) 60 MG/0.6ML injection Inject 0.6 mLs (60 mg total) into the skin daily. 02/10/23 05/11/23  Standley Brooking, MD  famotidine (PEPCID) 20 MG tablet Take 20 mg by mouth 2 (two) times daily as needed for heartburn or indigestion.    [provider]  furosemide (LASIX) 20 MG tablet TAKE 1 TABLET BY MOUTH EVERY DAY Patient taking differently: Take 20 mg by mouth daily as needed for fluid or edema. 01/22/23   Karie Georges, MD  gabapentin (NEURONTIN) 300 MG capsule Take 1 capsule (300 mg total) by mouth 3 (three) times daily. 02/14/23  [name], MD  insulin aspart (NOVOLOG) 100 UNIT/ML injection Inject 0-10 Units into the skin 3 (three) times daily before meals.    [provider]  insulin detemir (LEVEMIR FLEXPEN) 100 UNIT/ML FlexPen INJECT 0.2MLS (20 UNITS) INTO THE SKIN TWICE A DAY 03/22/23   [name], MD  insulin detemir (LEVEMIR) 100 UNIT/ML injection Inject 0.2 mLs (20 Units total) into the skin 2 (two) times daily. Patient taking differently: Inject 10-20 Units into the skin See admin instructions. Inject 10-20 units into the skin two times a day before meals, per sliding scale 08/30/22   Ennever, Rose Phi, MD  lidocaine (LIDODERM) 5 % Place 1 patch onto the skin daily. Remove & Discard patch within  12 hours or as directed by MD 03/09/23   [name], MD  oxybutynin (DITROPAN-XL) 10 MG 24 hr tablet Take 1 tablet (10 mg total) by mouth daily. 03/01/23   Karie Georges, MD  oxycodone (OXY-IR) 5 MG capsule Take 1 capsule (5 mg total) by mouth every 4 (four) hours as needed for pain. 12/13/22   [name], MD  polyethylene glycol (MIRALAX / GLYCOLAX) 17 g packet Take 17 g by mouth daily as needed for mild constipation. 09/05/21   Pokhrel, Rebekah Chesterfield, MD  prochlorperazine (COMPAZINE) 10 MG tablet Take 10 mg by mouth every 6 (six) hours as needed for nausea or vomiting. 08/18/21   [provider]  sodium bicarbonate 650 MG tablet Take 1 tablet (650 mg total) by mouth 3 (three) times daily. Patient taking differently: Take 650 mg by mouth 2 (two) times daily. 02/10/23   Standley Brooking, MD  venlafaxine XR (EFFEXOR XR) 37.5 MG 24 hr capsule Take 1 capsule (37.5 mg total) by mouth daily with breakfast. 12/26/22   Karie Georges, MD      Allergies    Paclitaxel and Taxotere [docetaxel]    Review of Systems   Review of Systems  Constitutional:  Positive for fatigue. Negative for chills, diaphoresis and fever.  HENT:  Negative for congestion, rhinorrhea and sneezing.   Eyes: Negative.   Respiratory:  Positive for shortness of breath. Negative for cough and chest tightness.   Cardiovascular:  Positive for chest pain. Negative for leg swelling.  Gastrointestinal:  Positive for abdominal pain. Negative for blood in stool, diarrhea, nausea and vomiting.  Genitourinary:  Negative for difficulty urinating, flank pain, frequency and hematuria.  Musculoskeletal:  Positive for arthralgias and back pain.  Skin:  Negative for rash.  Neurological:  Positive for weakness (Generalized). Negative for dizziness, speech difficulty, numbness and headaches.    Physical Exam Updated Vital Signs BP 93/67   Pulse 88   Temp 98.4 F (36.9 C)   Resp 17   Ht 5\' 4"  (1.626 m)   Wt 65 kg   SpO2  94%   BMI 24.60 kg/m  Physical Exam  ED Results / Procedures / Treatments   Labs (all labs ordered are listed, but only abnormal results are displayed) Labs Reviewed  COMPREHENSIVE METABOLIC PANEL - Abnormal; Notable for the following components:      Result Value   Sodium 133 (*)    CO2 14 (*)    Glucose, Bld 317 (*)    BUN 97 (*)    Creatinine, Ser 5.53 (*)    Calcium 8.1 (*)    Albumin 2.3 (*)    AST 74 (*)    Total Bilirubin 0.2 (*)    GFR, Estimated 8 (*)  All other components within normal limits  CBC WITH DIFFERENTIAL/PLATELET - Abnormal; Notable for the following components:   RBC 2.96 (*)    Hemoglobin 8.4 (*)    HCT 26.4 (*)    RDW 18.5 (*)    Lymphs Abs 0.6 (*)    Abs Immature Granulocytes 0.29 (*)    All other components within normal limits  CK - Abnormal; Notable for the following components:   Total CK 2,751 (*)    All other components within normal limits  I-STAT VENOUS BLOOD GAS, ED - Abnormal; Notable for the following components:   pH, Ven 7.232 (*)    pCO2, Ven 34.4 (*)    pO2, Ven 27 (*)    Bicarbonate 14.4 (*)    TCO2 15 (*)    Acid-base deficit 12.0 (*)    Sodium 134 (*)    Calcium, Ion 1.09 (*)    HCT 26.0 (*)    Hemoglobin 8.8 (*)    All other components within normal limits  CULTURE, BLOOD (ROUTINE X 2)  CULTURE, BLOOD (ROUTINE X 2)  LACTIC ACID, PLASMA  LACTIC ACID, PLASMA  PROTIME-INR  APTT  URINALYSIS, W/ REFLEX TO CULTURE (INFECTION SUSPECTED)  BETA-HYDROXYBUTYRIC ACID  URINALYSIS, ROUTINE W REFLEX MICROSCOPIC  BASIC METABOLIC PANEL  BASIC METABOLIC PANEL  BASIC METABOLIC PANEL  BASIC METABOLIC PANEL  BETA-HYDROXYBUTYRIC ACID  BETA-HYDROXYBUTYRIC ACID  CBG MONITORING, ED    EKG EKG Interpretation Date/Time:  Tuesday March 27 2023 12:31:50 EDT Ventricular Rate:  98 PR Interval:  144 QRS Duration:  124 QT Interval:  364 QTC Calculation: 468 R Axis:   -14  Text Interpretation: Sinus rhythm IVCD, consider  atypical LBBB since last tracing no significant change Confirmed by Rolan Bucco [phone]) on 03/27/2023 2:24:06 PM  Radiology DG Chest Port 1 View  Result Date: 03/27/2023 CLINICAL DATA:  Sepsis.  Fall yesterday. EXAM: PORTABLE CHEST 1 VIEW COMPARISON:  X-ray 02/20/2023. CT scan chest abdomen pelvis 03/26/2023 FINDINGS: Right IJ chest port with tip at the central SVC. Underinflation. No pneumothorax, effusion. Overlapping cardiac leads. Stable cardiopericardial silhouette. Perihilar nodular contour opacities are once again identified, similar to the previous CT scan. Mild adjacent ground-glass interstitial type changes. IMPRESSION: Stable perihilar nodular opacities. Please correlate with the prior CT. No pneumothorax or effusion. Chest port Electronically Signed   By: Karen Kays M.D.   On: 03/27/2023 14:52   CT CHEST ABDOMEN PELVIS WO CONTRAST  Result Date: 03/26/2023 CLINICAL DATA:  Polytrauma, blunt. Fall. Metastatic endometrial cancer with bone metastasis. EXAM: CT CHEST, ABDOMEN AND PELVIS WITHOUT CONTRAST TECHNIQUE: Multidetector CT imaging of the chest, abdomen and pelvis was performed following the standard protocol without IV contrast. RADIATION DOSE REDUCTION: This exam was performed according to the departmental dose-optimization program which includes automated exposure control, adjustment of the mA and/or kV according to patient size and/or use of iterative reconstruction technique. COMPARISON:  03/23/2023. FINDINGS: CT CHEST FINDINGS Cardiovascular: Heart is normal in size and there is a trace pericardial effusion. Multi-vessel coronary artery calcifications are noted. A right chest port terminates at the cavoatrial junction. There is atherosclerotic calcification of the aorta without evidence of aneurysm. The pulmonary trunk is normal in caliber. Mediastinum/Nodes: No mediastinal or axillary lymphadenopathy. Evaluation of the hila is limited due to lack of IV contrast. The thyroid gland,  trachea, and esophagus are within normal limits. Lungs/Pleura: Increased scattered ground-glass opacities are noted in the right lung. Stable ground-glass opacities are present in the left lung. There is redemonstration of  masses in the right upper lobe measuring 4.5 x 3.9 cm, not significantly changed when differences in technique are taken into consideration. There is a mass in the right lower lobe measuring 6.0 x 4.9 cm, axial image 90. There is a mass in the left upper lobe measuring 5.4 x 4.9 cm, axial image 85. A stable 1.2 cm nodule is present in the right lower lobe seen within a cavitary structure, axial image 111. Additional smaller nodular opacities are seen bilaterally and unchanged. No effusion or pneumothorax is seen. Musculoskeletal: Degenerative changes are present in the thoracic spine. No acute fracture is seen. A stable lytic region is noted in the medial border of the left scapula. There is a lytic lesion in the tenth rib on the left, unchanged. CT ABDOMEN PELVIS FINDINGS Hepatobiliary: No focal liver abnormality is seen. A stone is present within the gallbladder. No biliary ductal dilatation. Pancreas: Unremarkable. No pancreatic ductal dilatation or surrounding inflammatory changes. Spleen: Normal in size without focal abnormality. Adrenals/Urinary Tract: No adrenal nodule or mass. Left renal atrophy is noted. Renal calculi are noted bilaterally. There is a left ureteral stent in place without evidence of obstructive uropathy. There is a right ureteral stent in place with mild hydroureteronephrosis. No obstructing stone is seen. The bladder is unremarkable. Stomach/Bowel: Stomach is within normal limits. Appendix appears normal. No evidence of bowel wall thickening, distention, or inflammatory changes. No free air or pneumatosis. Scattered diverticula are present along the colon without evidence of diverticulitis. Vascular/Lymphatic: An IVC filter is noted. Aortic atherosclerosis. No abdominal  or pelvic lymphadenopathy. Reproductive: Status post hysterectomy. No adnexal masses. Other: No abdominopelvic ascites. Musculoskeletal: Degenerative changes are present in the lumbar spine. No acute osseous abnormality is seen. IMPRESSION: 1. Stable lytic lesions in the bones without evidence of acute fracture. 2. Bilateral pulmonary masses and nodules, not significantly changed when differences in technique are taken into consideration. Increased ground-glass opacities are present in the right lung and stable ground-glass opacities are noted in the left lung, possibly representing metastatic disease. Clinical correlation is recommended to exclude superimposed infection. 3. Cavitary lesion with nodules in the right lower lobe, suspicious for bronchogenic carcinoma. 4. Bilateral nephrolithiasis with ureteral stents in place. There is mild-to-moderate hydroureteronephrosis on the right with no evidence of obstructing stone. Electronically Signed   By: Thornell Sartorius M.D.   On: 03/26/2023 04:19   CT L-SPINE NO CHARGE  Result Date: 03/26/2023 CLINICAL DATA:  fall. Pt. Is a cancer patient, with mets to the bone found recently. Pt. Did not lose consciousness or hit her head. She does take blood thinners. Pt. C/o L side pain. EXAM: CT THORACIC AND LUMBAR SPINE WITHOUT CONTRAST TECHNIQUE: Multidetector CT imaging of the thoracic and lumbar spine was performed without contrast. Multiplanar CT image reconstructions were also generated. RADIATION DOSE REDUCTION: This exam was performed according to the departmental dose-optimization program which includes automated exposure control, adjustment of the mA and/or kV according to patient size and/or use of iterative reconstruction technique. COMPARISON:  None Available. FINDINGS: CT THORACIC SPINE FINDINGS Alignment: Normal. Vertebrae: Multilevel mild-to-moderate degenerative changes of the spine. No associated severe osseous neural foraminal or central canal stenosis. Lytic  lesion of the T12 left inferior articular facet and lamina with extension to the spinous process. Destruction of the cortex. Paraspinal and other soft tissues: Negative. Disc levels: Mild multilevel intervertebral disc space narrowing. CT LUMBAR SPINE FINDINGS Segmentation: 5 lumbar type vertebrae. Alignment: Normal. Vertebrae: L5-S1 posterior disc osteophyte complex formation. No associated severe  osseous neural foraminal or central canal stenosis. No acute fracture or focal pathologic process. Paraspinal and other soft tissues: Negative. Disc levels: Maintained. Other: Atherosclerotic plaque. Right chest wall Port-A-Cath. Inferior vena cava filter. Bilateral ureteral stents. Question lytic lesion of the S5 level. Please see separately dictated CT chest abdomen pelvis 03/26/2023. IMPRESSION: CT THORACIC SPINE IMPRESSION 1. Lytic lesion of the T12 left inferior articular facet and lamina with extension to the spinous process. Destruction of the cortex. Finding consistent with metastatic disease as noted in the history. 2. No acute displaced fracture or traumatic listhesis of the thoracic spine. CT LUMBAR SPINE IMPRESSION 1. No acute displaced fracture or traumatic listhesis of the lumbar spine. Other imaging findings of potential clinical significance: 1. Lung disease. 2. Question lytic lesion of the S5 level. 3. Aortic Atherosclerosis (ICD10-I70.0). 4. Please see separately dictated CT chest abdomen pelvis 03/26/2023. Electronically Signed   By: Tish Frederickson M.D.   On: 03/26/2023 04:06   CT T-SPINE NO CHARGE  Result Date: 03/26/2023 CLINICAL DATA:  fall. Pt. Is a cancer patient, with mets to the bone found recently. Pt. Did not lose consciousness or hit her head. She does take blood thinners. Pt. C/o L side pain. EXAM: CT THORACIC AND LUMBAR SPINE WITHOUT CONTRAST TECHNIQUE: Multidetector CT imaging of the thoracic and lumbar spine was performed without contrast. Multiplanar CT image reconstructions were also  generated. RADIATION DOSE REDUCTION: This exam was performed according to the departmental dose-optimization program which includes automated exposure control, adjustment of the mA and/or kV according to patient size and/or use of iterative reconstruction technique. COMPARISON:  None Available. FINDINGS: CT THORACIC SPINE FINDINGS Alignment: Normal. Vertebrae: Multilevel mild-to-moderate degenerative changes of the spine. No associated severe osseous neural foraminal or central canal stenosis. Lytic lesion of the T12 left inferior articular facet and lamina with extension to the spinous process. Destruction of the cortex. Paraspinal and other soft tissues: Negative. Disc levels: Mild multilevel intervertebral disc space narrowing. CT LUMBAR SPINE FINDINGS Segmentation: 5 lumbar type vertebrae. Alignment: Normal. Vertebrae: L5-S1 posterior disc osteophyte complex formation. No associated severe osseous neural foraminal or central canal stenosis. No acute fracture or focal pathologic process. Paraspinal and other soft tissues: Negative. Disc levels: Maintained. Other: Atherosclerotic plaque. Right chest wall Port-A-Cath. Inferior vena cava filter. Bilateral ureteral stents. Question lytic lesion of the S5 level. Please see separately dictated CT chest abdomen pelvis 03/26/2023. IMPRESSION: CT THORACIC SPINE IMPRESSION 1. Lytic lesion of the T12 left inferior articular facet and lamina with extension to the spinous process. Destruction of the cortex. Finding consistent with metastatic disease as noted in the history. 2. No acute displaced fracture or traumatic listhesis of the thoracic spine. CT LUMBAR SPINE IMPRESSION 1. No acute displaced fracture or traumatic listhesis of the lumbar spine. Other imaging findings of potential clinical significance: 1. Lung disease. 2. Question lytic lesion of the S5 level. 3. Aortic Atherosclerosis (ICD10-I70.0). 4. Please see separately dictated CT chest abdomen pelvis 03/26/2023.  Electronically Signed   By: Tish Frederickson M.D.   On: 03/26/2023 04:06   CT HEAD WO CONTRAST  Result Date: 03/26/2023 CLINICAL DATA:  Head trauma, moderate-severe; Polytrauma, blunt EXAM: CT HEAD WITHOUT CONTRAST CT CERVICAL SPINE WITHOUT CONTRAST TECHNIQUE: Multidetector CT imaging of the head and cervical spine was performed following the standard protocol without intravenous contrast. Multiplanar CT image reconstructions of the cervical spine were also generated. RADIATION DOSE REDUCTION: This exam was performed according to the departmental dose-optimization program which includes automated exposure control, adjustment  of the mA and/or kV according to patient size and/or use of iterative reconstruction technique. COMPARISON:  CT head 02/20/2023 FINDINGS: CT HEAD FINDINGS Brain: Patchy and confluent areas of decreased attenuation are noted throughout the deep and periventricular white matter of the cerebral hemispheres bilaterally, compatible with chronic microvascular ischemic disease. Left parietal central semiovale encephalomalacia. No evidence of large-territorial acute infarction. No parenchymal hemorrhage. No mass lesion. No extra-axial collection. No mass effect or midline shift. No hydrocephalus. Basilar cisterns are patent. Vascular: No hyperdense vessel. Atherosclerotic calcifications are present within the cavernous internal carotid and vertebral arteries. Skull: No acute fracture or focal lesion. Sinuses/Orbits: Paranasal sinuses and mastoid air cells are clear. The orbits are unremarkable. Other: None. CT CERVICAL SPINE FINDINGS Alignment: Normal. Skull base and vertebrae: Multilevel mild-to-moderate degenerative changes of the spine with posterior disc osteophyte complex formation at the C3-C4 and C5-C6 levels. No associated severe osseous neural foraminal or central canal stenosis. No acute fracture. No aggressive appearing focal osseous lesion or focal pathologic process. Soft tissues and  spinal canal: No prevertebral fluid or swelling. No visible canal hematoma. Upper chest: Biapical ground-glass and consolidative peribronchovascular airspace opacities. No apical pneumothoraces. Other: Partially visualized right internal jugular central venous catheter. IMPRESSION: 1. No acute intracranial abnormality. 2. No acute displaced fracture or traumatic listhesis of the cervical spine. 3. Biapical ground-glass and consolidative peribronchovascular airspace opacities. Please see separately dictated CT chest 03/26/2023. Electronically Signed   By: Tish Frederickson M.D.   On: 03/26/2023 03:57   CT CERVICAL SPINE WO CONTRAST  Result Date: 03/26/2023 CLINICAL DATA:  Head trauma, moderate-severe; Polytrauma, blunt EXAM: CT HEAD WITHOUT CONTRAST CT CERVICAL SPINE WITHOUT CONTRAST TECHNIQUE: Multidetector CT imaging of the head and cervical spine was performed following the standard protocol without intravenous contrast. Multiplanar CT image reconstructions of the cervical spine were also generated. RADIATION DOSE REDUCTION: This exam was performed according to the departmental dose-optimization program which includes automated exposure control, adjustment of the mA and/or kV according to patient size and/or use of iterative reconstruction technique. COMPARISON:  CT head 02/20/2023 FINDINGS: CT HEAD FINDINGS Brain: Patchy and confluent areas of decreased attenuation are noted throughout the deep and periventricular white matter of the cerebral hemispheres bilaterally, compatible with chronic microvascular ischemic disease. Left parietal central semiovale encephalomalacia. No evidence of large-territorial acute infarction. No parenchymal hemorrhage. No mass lesion. No extra-axial collection. No mass effect or midline shift. No hydrocephalus. Basilar cisterns are patent. Vascular: No hyperdense vessel. Atherosclerotic calcifications are present within the cavernous internal carotid and vertebral arteries. Skull: No  acute fracture or focal lesion. Sinuses/Orbits: Paranasal sinuses and mastoid air cells are clear. The orbits are unremarkable. Other: None. CT CERVICAL SPINE FINDINGS Alignment: Normal. Skull base and vertebrae: Multilevel mild-to-moderate degenerative changes of the spine with posterior disc osteophyte complex formation at the C3-C4 and C5-C6 levels. No associated severe osseous neural foraminal or central canal stenosis. No acute fracture. No aggressive appearing focal osseous lesion or focal pathologic process. Soft tissues and spinal canal: No prevertebral fluid or swelling. No visible canal hematoma. Upper chest: Biapical ground-glass and consolidative peribronchovascular airspace opacities. No apical pneumothoraces. Other: Partially visualized right internal jugular central venous catheter. IMPRESSION: 1. No acute intracranial abnormality. 2. No acute displaced fracture or traumatic listhesis of the cervical spine. 3. Biapical ground-glass and consolidative peribronchovascular airspace opacities. Please see separately dictated CT chest 03/26/2023. Electronically Signed   By: Tish Frederickson M.D.   On: 03/26/2023 03:57   DG Shoulder Right  Result Date:  03/26/2023 CLINICAL DATA:  fall EXAM: RIGHT SHOULDER - 2+ VIEW COMPARISON:  CT chest 03/26/2023 FINDINGS: There is no evidence of fracture or dislocation. There is no evidence of arthropathy or other focal bone abnormality. Soft tissues are unremarkable. Right chest wall accessed dialysis catheter with tip overlying the right atrium. Diffuse peribronchovascular central airspace and interstitial opacities. IMPRESSION: 1. Diffuse peribronchovascular central airspace and interstitial opacities. Please see separately dictated CT chest 03/26/2023. 2.  No acute displaced fracture or dislocation. Electronically Signed   By: Tish Frederickson M.D.   On: 03/26/2023 03:53    Procedures Procedures    Medications Ordered in ED Medications  lactated ringers bolus  500 mL (has no administration in time range)  insulin regular, human (MYXREDLIN) 100 units/ 100 mL infusion (has no administration in time range)  lactated ringers infusion (has no administration in time range)  dextrose 5 % in lactated ringers infusion (has no administration in time range)  dextrose 50 % solution 0-50 mL (has no administration in time range)  lactated ringers bolus 1,000 mL (0 mLs Intravenous Stopped 03/27/23 1449)  fentaNYL (SUBLIMAZE) injection 50 mcg (50 mcg Intravenous Given 03/27/23 1314)  fentaNYL (SUBLIMAZE) injection 50 mcg (50 mcg Intravenous Given 03/27/23 1504)    ED Course/ Medical Decision Making/ A&P                                 Medical Decision Making Amount and/or Complexity of Data Reviewed Labs: ordered. Radiology: ordered. ECG/medicine tests: ordered.  Risk Prescription drug management.   Patient is a 57 year old female who has metastatic endometrial carcinoma who presents with general weakness and shortness of breath.  She has been seen in the ED twice in the last few days.  She had extensive imaging studies of her chest abdomen pelvis.  She was found to have increased metastases across the lungs.  No overt evidence of pneumonia.  She is currently on oxygen at 2 L/min which was started a few days ago.  She is maintaining normal oxygen saturations at this level.  She does have a history of prior DVTs but she is on Lovenox and takes her shots regularly.  She has not missed doses.  She does have an IVC filter in place as well.  Given her creatinine, cannot do a contrast study currently.  Although I suspect her symptoms are related to her multiple lung metastases.  She also was noted to be hypotensive here in the ED with blood pressures in the 70 systolic.  Her blood pressures actually improved nicely with IV fluids.  The last pressure was 93/67.  She does not have a fever.  She does not have an elevated WBC count.  Her lactate is normal.  I reviewed her CT  scans from yesterday.  There is no obvious concerns for infection.  She is currently on Omnicef for UTI.  We are currently awaiting urinalysis.  Her hemoglobin is slightly improved from her value yesterday.  She did get a blood transfusion in the interim.  Her creatinine is elevated over 5 and has been worsening over the last few weeks.  I had a long discussion with her regarding admission.  She is amenable to admission at this point.  Would like a palliative care consult while she is in the hospital.  Discussed with [facility] hospitalist who will accept the patient for transfer.  Given her elevated glucose and low bicarb, will start Endo  tool, although her anion gap is normal.  Will continue IV fluids.  CRITICAL CARE Performed by: Rolan Bucco Total critical care time: 70 minutes Critical care time was exclusive of separately billable procedures and treating other patients. Critical care was necessary to treat or prevent imminent or life-threatening deterioration. Critical care was time spent personally by me on the following activities: development of treatment plan with patient and/or surrogate as well as nursing, discussions with consultants, evaluation of patient's response to treatment, examination of patient, obtaining history from patient or surrogate, ordering and performing treatments and interventions, ordering and review of laboratory studies, ordering and review of radiographic studies, pulse oximetry and re-evaluation of patient's condition.   Final Clinical Impression(s) / ED Diagnoses Final diagnoses:  Malignant neoplasm metastatic to both lungs (HCC)  Hypotension, unspecified hypotension type  Iron deficiency anemia, unspecified iron deficiency anemia type  AKI (acute kidney injury) [facility])    Rx / DC Orders ED Discharge Orders     None         Rolan Bucco, MD 03/27/23 1529

## 2023-03-27 NOTE — ED Notes (Signed)
Difficult stick, got blue culture only sent to lab

## 2023-03-27 NOTE — Progress Notes (Signed)
Patient came to [facility] today for labs/port flush/ MD visit. Patient stated,"I feel so bad, I don't want to wait for my labs to be drawn or wait for Dr. Myna Hidalgo. I want to go downstairs to the ER." Dr. Myna Hidalgo notified.

## 2023-03-27 NOTE — Progress Notes (Deleted)
Cardiology Office Note:    Date:  03/27/2023   ID:  Jaime Gomez, DOB 1965/10/29, MRN [id number]  PCP:  Karie Georges, MD  Cardiologist:  Thurmon Fair, MD  Electrophysiologist:  None   Referring MD: Karie Georges, MD   Chief Complaint: follow-up of stress-induced cardiomyopathy  History of Present Illness:    Jaime Gomez is a 57 y.o. 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 Royann Shivers and presents today for routine follow-up.   Patient was first seen by Cardiology during admission in 11/2022 for sepsis secondary to UTI as well as AKI superimposed with creatinine of 4.92 on admission. She underwent nephrostomy tube exchange on 5/8. Blood cultures grew pseudomonas. She was started on IV antibiotics and well as IV fluids and sodium bicarb. Hospitalization has been complicated AKI, metabolic encephalopathy, acute vs subactue CVA, and acute on chronic anemia requiring blood transfusion. Brain MRI showed acute or early subactue left frontoparietal infarction with additional punctate acute infarcts in the cerebellum and bilateral occipital lobes. Neurology was consulted and felt etiology was likely cardioembolic vs secondary hypercoagulable state of malignancy vs atheroembolic from potential critical vessel stenosis. Altered mental status was ultimately felt to be due to cefepime toxicity and mental status returned to baseline after this was discontinued. Echo was ordered and showed LVEF of 30-35% with global hypokinesis and apical akinesis as well as grade 1 diastolic dysfunction. Cardiomyopathy felt to  most likely be stress-induced giving acute illness. She was not felt to be a good cardiac catheterization given AKI and altered mental status. Repeat limited Echo prior to discharge showed improvement in LVEF to 45-50% with global hypokinesis. GDMT was limited by renal function and soft BP. She was initially started on Bidil but this was discontinued prior to discharge due to soft BP and she was discharge on Coreg alone which she was already on at home.   She was last seen by me in 12/2022 at which time she reported some lower extremity since being discharge from the hospital which had improved with Lasix as well as some dizziness with sitting for prolonged periods of time but this was not new. However, she denied any chest pain or CHF symptoms.   Since this visit, she has had multiple ED visit/ hospitalizations. She was admitted from 02/08/2023 to 02/10/2023 for acute DVT, suspected PE and possible pneumonia after presenting with left-sided flank pain and axillary chest pain. Coumadin had recently been held for right subclavian port placement and INR was noted to be subtherapeutic on admission. He was initially treated with Heparin and then transitioned to indefinite Enoxaparin. Repeat Echo during admission showed LVEF of 55-60% with no regional wall motion abnormalities, normal RV, and no significant valvular disease. Imaging during admission showed increased perihilar masses, new bilaeral pulmonary nodules, and a mass in the upper right calf suspicious for metastasis. She was seen in the ED on 02/20/2023 after a mechanical fall in which she hit her head and chipped her two front teeth. She was seen again in the ED on 03/23/2023 with shortness of breath and right sided pleuritic chest pain. She also reported a fever as high as 102  at home. She was mildly hypoxic with O2 sat of 89% on arrival. High-sensitivity troponin was minimally elevated and flat in the 20s not consistent with ACS. BNP was in the 500s (down from  2,600s in 11/2022). CT showed progressive multifocal pulmonary metastases, progressive cavitary lesion favoring primary bronchogenic carcinoma, osseous metastases, and hydronephrosis. Creatinine had worsened from and was 4.97. Blood cultures and lactic acid were negative. Patient declined admission and plan was to see palliative care as an outpatient the following week. She was then admitted from 03/26/2023 to ***    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/2021   Ovarian cancer (HCC) 2013   Pneumonia 12/03/2022   Stroke (HCC)    Wears glasses     Past Surgical History:  Procedure Laterality Date   ABDOMINAL HYSTERECTOMY     BOTOX INJECTION N/A 01/12/2023   Procedure: BOTOX INJECTION INTO BLADDER;  Surgeon: Jannifer Hick, MD;  Location: [facility];  Service: Urology;  Laterality: N/A;   CYSTOSCOPY W/ RETROGRADES Right 01/12/2023   Procedure: CYSTOSCOPY WITH RIGHT  RETROGRADE PYELOGRAM/ RIGHT URETERAL STENT CHANGE;  Surgeon: Jannifer Hick, MD;  Location: [facility];  Service: Urology;  Laterality: Right;   CYSTOSCOPY W/ URETERAL STENT PLACEMENT Bilateral 09/01/2021   Procedure: CYSTOSCOPY WITH RETROGRADE PYELOGRAM/URETERAL STENT PLACEMENT;  Surgeon: Jannifer Hick, MD;  Location: [facility];  Service: Urology;  Laterality: Bilateral;   CYSTOSCOPY WITH FULGERATION N/A 01/12/2023   Procedure: CYSTOSCOPY WITH FULGERATION;  Surgeon: Jannifer Hick, MD;  Location: [facility];  Service: Urology;  Laterality: N/A;   CYSTOSCOPY WITH STENT PLACEMENT Right 05/19/2022   Procedure:  CYSTOSCOPY WITH STENT CHANGE;  Surgeon: Jannifer Hick, MD;  Location: [facility];  Service: Urology;  Laterality: Right;  ONLY NEEDS 30 MIN   HERNIA REPAIR     IR CONVERT LEFT NEPHROSTOMY TO NEPHROURETERAL CATH  11/02/2021   IR CONVERT LEFT NEPHROSTOMY TO NEPHROURETERAL CATH  01/09/2023   IR CV LINE INJECTION  08/23/2021   IR DIL URETER LEFT  01/09/2023   IR IMAGING GUIDED PORT INSERTION  11/14/2021   IR IMAGING GUIDED PORT INSERTION  01/09/2023   IR NEPHROSTOMY EXCHANGE LEFT  11/02/2021   IR NEPHROSTOMY EXCHANGE LEFT  01/20/2022   IR NEPHROSTOMY EXCHANGE LEFT  11/29/2022   IR NEPHROSTOMY PLACEMENT LEFT  09/03/2021   IR REMOVAL TUN ACCESS W/ PORT W/O FL MOD SED  11/14/2021   IR REMOVAL TUN ACCESS W/ PORT W/O FL MOD SED  12/04/2022   IR URETERAL STENT PLACEMENT EXISTING ACCESS LEFT  02/28/2023    Current Medications: No outpatient medications have been marked as taking for the 04/03/23 encounter (Appointment) with Corrin Parker, PA-C.     Allergies:   Paclitaxel and Taxotere [docetaxel]   Social History   Socioeconomic History   Marital status: Single    Spouse name: Not on file   Number of children: Not on file   Years of education: Not on file   Highest education level: Not on file  Occupational History   Not on  file  Tobacco Use   Smoking status: Never   Smokeless tobacco: Never  Vaping Use   Vaping status: Never Used  Substance and Sexual Activity   Alcohol use: Never   Drug use: Never   Sexual activity: Not Currently    Birth control/protection: Surgical  Other Topics Concern   Not on file  Social History Narrative   Not on file   Social Determinants of Health   Financial Resource Strain: Low Risk  (06/27/2022)   Overall Financial Resource Strain (CARDIA)    Difficulty of Paying Living Expenses: Not hard at all  Food Insecurity: No Food Insecurity (02/13/2023)   Hunger Vital Sign    Worried About Running Out of Food in the Last Year: Never true    Ran Out of Food in the  Last Year: Never true  Transportation Needs: No Transportation Needs (02/13/2023)   PRAPARE - Administrator, Civil Service (Medical): No    Lack of Transportation (Non-Medical): No  Physical Activity: Not on file  Stress: No Stress Concern Present (06/27/2022)   [employer] of Occupational Health - Occupational Stress Questionnaire    Feeling of Stress : Not at all  Social Connections: Unknown (12/06/2021)   Received from [facility]   Social Network    Social Network: Not on file     Family History: The patient's ***family history includes Alcohol abuse in her father; Arthritis in her mother and sister; COPD in her mother; Depression in her brother and mother; Diabetes in her mother and sister; Drug abuse in her father; Heart attack in her mother; Heart disease in her mother and sister; Hyperlipidemia in her mother and sister; Miscarriages / Stillbirths in her sister.  ROS:   Please see the history of present illness.    *** All other systems reviewed and are negative.  EKGs/Labs/Other Studies Reviewed:    The following studies were reviewed today: ***  EKG:  EKG *** ordered today. EKG personally reviewed and demonstrates ***.  Recent Labs: 12/03/2022: TSH 2.520 12/06/2022: Magnesium 1.8 03/23/2023: B Natriuretic Peptide 559.0 03/26/2023: ALT 17; BUN 113; Creatinine, Ser 5.70; Hemoglobin 7.5; Platelets 181; Potassium 4.7; Sodium 133  Recent Lipid Panel    Component Value Date/Time   CHOL 162 12/04/2022 0232   TRIG 199 (H) 12/04/2022 0232   HDL 29 (L) 12/04/2022 0232   CHOLHDL 5.6 12/04/2022 0232   VLDL 40 12/04/2022 0232   LDLCALC 93 12/04/2022 0232    Physical Exam:    Vital Signs: There were no vitals taken for this visit.    Wt Readings from Last 3 Encounters:  03/23/23 143 lb (64.9 kg)  03/23/23 143 lb (64.9 kg)  03/23/23 135 lb (61.2 kg)     General: 57 y.o. female in no acute distress. HEENT: Normocephalic and atraumatic.  Sclera clear. EOMs intact. Neck: Supple. No carotid bruits. No JVD. Heart: *** RRR. Distinct S1 and S2. No murmurs, gallops, or rubs. Radial and distal pedal pulses 2+ and equal bilaterally. Lungs: No increased work of breathing. Clear to ausculation bilaterally. No wheezes, rhonchi, or rales.  Abdomen: Soft, non-distended, and non-tender to palpation. Bowel sounds present in all 4 quadrants.  MSK: Normal strength and tone for age. *** Extremities: No lower extremity edema.    Skin: Warm and dry. Neuro: Alert and oriented x3. No focal deficits. Psych: Normal affect. Responds appropriately.   Assessment:    No diagnosis found.  Plan:     Disposition: Follow up  in ***   Medication Adjustments/Labs and Tests Ordered: Current medicines are reviewed at length with the patient today.  Concerns regarding medicines are outlined above.  No orders of the defined types were placed in this encounter.  No orders of the defined types were placed in this encounter.   There are no Patient Instructions on file for this visit.   Signed, Corrin Parker, PA-C  03/27/2023 11:14 AM    [facility]

## 2023-03-27 NOTE — Progress Notes (Signed)
Call received from -ED attending at [facility] Request is for  -inpatient admission   Information received from ED attending as below:   57 year old female with metastatic endometrial carcinoma follows with Dr. Myna Hidalgo Progressively declining for last several days. She was seen in the ED on 8/30 and 9/2 but refused admission. Presented with worsening weakness.  Noted to have elevated creatinine to 5.53, bicarb low, pH low, elevated glucose Started on IV fluid I also suggested to add serum ketone level and urinalysis  Working diagnosis: AKI, dehydration, probable DKA as well Accepted to progressive bed

## 2023-03-28 DIAGNOSIS — C541 Malignant neoplasm of endometrium: Secondary | ICD-10-CM

## 2023-03-28 DIAGNOSIS — F321 Major depressive disorder, single episode, moderate: Secondary | ICD-10-CM

## 2023-03-28 DIAGNOSIS — C7801 Secondary malignant neoplasm of right lung: Secondary | ICD-10-CM | POA: Diagnosis not present

## 2023-03-28 DIAGNOSIS — C7802 Secondary malignant neoplasm of left lung: Secondary | ICD-10-CM

## 2023-03-28 DIAGNOSIS — N179 Acute kidney failure, unspecified: Secondary | ICD-10-CM | POA: Diagnosis not present

## 2023-03-28 DIAGNOSIS — N184 Chronic kidney disease, stage 4 (severe): Secondary | ICD-10-CM

## 2023-03-28 DIAGNOSIS — E872 Acidosis, unspecified: Secondary | ICD-10-CM

## 2023-03-28 DIAGNOSIS — Z794 Long term (current) use of insulin: Secondary | ICD-10-CM

## 2023-03-28 DIAGNOSIS — J9611 Chronic respiratory failure with hypoxia: Secondary | ICD-10-CM | POA: Diagnosis not present

## 2023-03-28 DIAGNOSIS — M6282 Rhabdomyolysis: Secondary | ICD-10-CM

## 2023-03-28 DIAGNOSIS — C7951 Secondary malignant neoplasm of bone: Secondary | ICD-10-CM | POA: Diagnosis not present

## 2023-03-28 DIAGNOSIS — B952 Enterococcus as the cause of diseases classified elsewhere: Secondary | ICD-10-CM

## 2023-03-28 DIAGNOSIS — N289 Disorder of kidney and ureter, unspecified: Secondary | ICD-10-CM

## 2023-03-28 DIAGNOSIS — E119 Type 2 diabetes mellitus without complications: Secondary | ICD-10-CM

## 2023-03-28 DIAGNOSIS — Z86718 Personal history of other venous thrombosis and embolism: Secondary | ICD-10-CM

## 2023-03-28 DIAGNOSIS — N390 Urinary tract infection, site not specified: Secondary | ICD-10-CM

## 2023-03-28 LAB — PREPARE RBC (CROSSMATCH)

## 2023-03-28 LAB — CULTURE, BLOOD (ROUTINE X 2)
Culture: NO GROWTH
Culture: NO GROWTH
Special Requests: ADEQUATE

## 2023-03-28 LAB — BASIC METABOLIC PANEL
Anion gap: 13 (ref 5–15)
BUN: 93 mg/dL — ABNORMAL HIGH (ref 6–20)
CO2: 19 mmol/L — ABNORMAL LOW (ref 22–32)
Calcium: 7.9 mg/dL — ABNORMAL LOW (ref 8.9–10.3)
Chloride: 103 mmol/L (ref 98–111)
Creatinine, Ser: 5.18 mg/dL — ABNORMAL HIGH (ref 0.44–1.00)
GFR, Estimated: 9 mL/min — ABNORMAL LOW (ref >60)
Glucose, Bld: 173 mg/dL — ABNORMAL HIGH (ref 70–99)
Potassium: 4.3 mmol/L (ref 3.5–5.1)
Sodium: 135 mmol/L (ref 135–145)

## 2023-03-28 LAB — GLUCOSE, CAPILLARY
Glucose-Capillary: 152 mg/dL — ABNORMAL HIGH (ref 70–99)
Glucose-Capillary: 137 mg/dL — ABNORMAL HIGH (ref 70–99)
Glucose-Capillary: 130 mg/dL — ABNORMAL HIGH (ref 70–99)
Glucose-Capillary: 120 mg/dL — ABNORMAL HIGH (ref 70–99)

## 2023-03-28 LAB — URINE CULTURE: Culture: NO GROWTH

## 2023-03-28 LAB — CBC
HCT: 23.2 % — ABNORMAL LOW (ref 36.0–46.0)
Hemoglobin: 7.2 g/dL — ABNORMAL LOW (ref 12.0–15.0)
MCH: 28.7 pg (ref 26.0–34.0)
MCHC: 31 g/dL (ref 30.0–36.0)
MCV: 92.4 fL (ref 80.0–100.0)
Platelets: 182 10*3/uL (ref 150–400)
RBC: 2.51 MIL/uL — ABNORMAL LOW (ref 3.87–5.11)
RDW: 18.3 % — ABNORMAL HIGH (ref 11.5–15.5)
WBC: 4.3 10*3/uL (ref 4.0–10.5)
nRBC: 0 % (ref 0.0–0.2)

## 2023-03-28 LAB — IRON AND TIBC
Iron: 44 ug/dL (ref 28–170)
Saturation Ratios: 30 % (ref 10.4–31.8)
TIBC: 147 ug/dL — ABNORMAL LOW (ref 250–450)
UIBC: 103 ug/dL

## 2023-03-28 MED ORDER — AMOXICILLIN 250 MG PO CAPS
250.0000 mg | ORAL_CAPSULE | Freq: Two times a day (BID) | ORAL | Status: DC
  Administered 2023-03-28 (×2): 250 mg via ORAL
  Filled 2023-03-28 (×3): qty 1

## 2023-03-28 MED ORDER — CHLORHEXIDINE GLUCONATE CLOTH 2 % EX PADS
6.0000 | MEDICATED_PAD | Freq: Every day | CUTANEOUS | Status: DC
  Administered 2023-03-29 – 2023-04-06 (×8): 6 via TOPICAL

## 2023-03-28 MED ORDER — SODIUM CHLORIDE 0.9% FLUSH
10.0000 mL | INTRAVENOUS | Status: DC | PRN
  Administered 2023-04-08: 10 mL

## 2023-03-28 MED ORDER — LIDOCAINE-PRILOCAINE 2.5-2.5 % EX CREA
TOPICAL_CREAM | CUTANEOUS | Status: DC | PRN
  Filled 2023-03-28: qty 5

## 2023-03-28 MED ORDER — SODIUM CHLORIDE 0.9% FLUSH
10.0000 mL | Freq: Two times a day (BID) | INTRAVENOUS | Status: DC
  Administered 2023-03-28 – 2023-04-07 (×7): 10 mL

## 2023-03-28 MED ORDER — FUROSEMIDE 10 MG/ML IJ SOLN
20.0000 mg | Freq: Once | INTRAMUSCULAR | Status: AC
  Administered 2023-03-28: 20 mg via INTRAVENOUS
  Filled 2023-03-28 (×2): qty 2

## 2023-03-28 MED ORDER — SODIUM CHLORIDE 0.9% IV SOLUTION: Freq: Once | INTRAVENOUS | Status: AC

## 2023-03-28 MED ORDER — FUROSEMIDE 10 MG/ML IJ SOLN
20.0000 mg | Freq: Once | INTRAMUSCULAR | Status: AC
  Administered 2023-03-28: 20 mg via INTRAVENOUS
  Filled 2023-03-28: qty 2

## 2023-03-28 MED ORDER — IPRATROPIUM-ALBUTEROL 0.5-2.5 (3) MG/3ML IN SOLN
3.0000 mL | RESPIRATORY_TRACT | Status: DC | PRN
  Administered 2023-03-28 – 2023-04-02 (×6): 3 mL via RESPIRATORY_TRACT
  Filled 2023-03-28 (×5): qty 3

## 2023-03-28 NOTE — Plan of Care (Signed)

## 2023-03-28 NOTE — Hospital Course (Addendum)
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  She is having progressive dyspnea and likely due to her pulmonary metastasis and was recently started on supplemental oxygen.  She was also recently started on treatment for urinary tract infection which grew out Enterococcus.  She reports that she is not experiencing any dysuria, fevers, chills, vomiting or diarrhea but has been progressively weaker with increasing fatigue and worsening cancer-related pain.  Her pain does improve with oxycodone at home.  But her sister reported that she was having worsening appetite.  Given her generalized weakness fatigue and pain she is brought to the ED and chest x-ray is notable for stable perihilar nodular opacities.  Blood work was done and showed a worsening AKI on CKD stage IV.  Blood cultures were obtained and she was given 1-1/2 L of LR, fentanyl and IV insulin.  She was transferred for [facility] for further evaluation and admission.  Medical oncology was consulted and are recommending transfusing 2 units of PRBCs.  Patient also has an impending fracture given the metastasis to her right tibia and was supposed to have surgical intervention to repair this.  She was transfused 2 units of PRBCs and the medical oncologist regarding discussed with orthopedic surgeon and recommending that she have a surgical repair of her right lower leg if possible and this is being planned for Tuesday.  She has a new noted ulcer on her toes show we obtained ABIs and MRI of her foot and have called vascular surgeon.  She continued to have some abdominal pain so CT scan  of the abdomen was repeated and palliative is adjusting her medications.  Assessment and Plan:  AKI superimposed on CKD IV, slowly improving Rhabdomyolysis in the setting of fall Metabolic acidosis -BUN/Cr Trend: Recent Labs  Lab 03/26/23 0300 03/26/23 0324 03/27/23 1245 03/27/23 1750 03/28/23 0418 03/29/23 0410 03/30/23 0353  BUN 99* 113* 97* 97* 93* 84* 77*  CREATININE 5.19* 5.70* 5.53* 5.11* 5.18* 4.39* 4.23*  -Patient is being hydrated with sodium bicarb and 150 mEq in sterile water at 100 mL/h for 20 hours which has not been discontinued -Medical oncology is typing and screening and transfusing 2 units of PRBCs -Patient's rhabdomyolysis is slowly improving and CK went from 3764 -> 2751 -> 803 and is now 416 and will repeat in the a.m. -Metabolic acidosis on admission which is now slowly improving.  Patient CO2 is now 19, anion gap of 13, chloride level is now 103 -Avoid Nephrotoxic Medications, Contrast Dyes if possible, Hypotension and Dehydration to Ensure Adequate Renal Perfusion and will need to Renally Adjust Meds -Continue to Monitor and Trend Renal Function carefully and repeat CMP in the AM  -Continue with supportive care as renal function is improving very slowly and she will be changed to heparin drip  Endometrial Cancer with Metastases - Has received multiple different treatments under the care of Dr. Myna Hidalgo, now wants to focus on quality of life and asks that we consult Palliative Care this admission  -Continue with supportive care and pain control and continue with oxycodone 5 mg p.o. every 4 as needed for moderate pain as well as bowel regimen with senna docusate 1 tab p.o. nightly as needed  for mild constipation -See below -Consult Palliative Care, notify Dr. Myna Hidalgo of admission   -Dr. Myna Hidalgo has evaluated and recommending transfusing 2 units of PRBCs and will discuss with orthopedic surgery about her impending leg fracture from the metastasis; she received 20  mg of IV Lasix between her blood -Appreciate medical oncology evaluation   Hx of DVT and PE  -Continued with enoxaparin 60 mg subcu every 24 for now but tonight we will change her to heparin drip in anticipation for surgical intervention on Tuesday   Insulin-dependent DM with associated hypoglycemia -A1c was 7.7% in August 2024  -Check CBGs and continue insulin detemir but reduced to 5 units subcu twice daily as well as very sensitive NovoLog/scale insulin before meals and at bedtime -Continue monitor CBGs and glucose trends carefully and adjust as necessary; will continue with hypoglycemic protocols -CBG Trend: Recent Labs  Lab 03/30/23 0758 03/30/23 0811 03/30/23 0821 03/30/23 0834 03/30/23 0902 03/30/23 1124 03/30/23 1633  GLUCAP 50* 58* 53* 72 93 150* 158*  -Glucose Trend: Recent Labs  Lab 03/26/23 0300 03/26/23 0324 03/27/23 1245 03/27/23 1750 03/28/23 0418 03/29/23 0410 03/30/23 0353  GLUCOSE 203* 199* 317* 218* 173* 69* 64*    Hypertension  -BP was low in ED and antihypertensives held on admission  -Continue to monitor blood pressures per protocol -Last blood pressure was 108/69   Depression and Anxiety -Continue with venlafaxine XR 37.5 mg p.o. daily with breakfast  Recent Enterococcus UTI -Pansensitive and was placed on amoxicillin which will be continued at 250 mg p.o. every 12 given her renal function -Most recent urinalysis done showed a turbid appearance with 50 glucose, large hemoglobin, large leukocytes, negative nitrites, 100 protein, no bacteria seen, present mucus, present budding yeast, greater than 50 RBCs per high-power field, 0-5 squamous epithelial cells, greater than 50 WBCs -Urine culture is negative and showing no growth given that she was on antibiotics prior to admission -Blood cultures x 2 obtained and showing no growth to date so far at 3 Days  Normocytic Anemia/Anemia of Chronic Disease -Hgb/Hct Trend:  Recent Labs  Lab 03/26/23 0300  03/26/23 0324 03/27/23 1245 03/27/23 1253 03/28/23 0418 03/29/23 0410 03/30/23 0353  HGB 7.7* 7.5* 8.4* 8.8* 7.2* 8.9* 9.7*  HCT 24.9* 22.0* 26.4* 26.0* 23.2* 27.2* 31.3*  MCV 90.5  --  89.2  --  92.4 86.1 89.9  -Iron/anemia panel was checked and showed an iron level of 44, UIBC 103, TIBC 147, saturation ratios of 30% -Medical oncology was consulted and transfusion patient 2 units of PRBCs -Continue to monitor for signs and symptoms bleeding; no overt bleeding note -Repeat CBC in a.m.  Abdominal Discomfort and likely Visceral Pain from Mets -Repeat CT scan of the abdomen pelvis without contrast and obtain a KUB  -CT Scan repeatd and done and showed "No acute intra-abdominal or pelvic pathology.  Bilateral ureteral stents in similar position as the prior CT. Similar appearance of mild right hydronephrosis. Cholelithiasis. Mild sigmoid diverticulosis. No bowel obstruction. Normal appendix.  Aortic Atherosclerosis." -Palliative consulted and they feel that her pain is visceral from lung metastasis and from possible metastasis and they feel that ideally she would benefit from dexamethasone but given her plans for surgical intervention on Tuesday they do not want immunocompromise or and we cannot use NSAIDs or anti-inflammatories given her poor renal function so if initiated hydromorphones or 0.5 mg every 3 as needed and we will see how she does overnight and recommending considering a long-acting opioid.  They have also  started her on Flexeril 5 mg 3 times daily as needed as well as a bowel regimen and also going to give her morphine 10 mg nebulized every 2 as needed for shortness of breath  Dizziness -Check Orthostatics in the AM; Also was hypoglycemic  -Given a 500 mL bolus yesterday -Obtain PT vestibular evaluation given that she was dizzy just lying in the bed as well -Recent Head CT done and showed "No acute intracranial abnormality. No acute displaced fracture or traumatic listhesis of the  cervical spine. Biapical ground-glass and consolidative peribronchovascular airspace opacities. 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 Jerl Santos and the PA and plan is for surgical intervention on Tuesday  Right toe ulcer and wound -Has a full-thickness ulcer and will check MRI of the foot -Wound care consulted and also check ABIs -ABI's done and showed "Resting right ankle-brachial index indicates noncompressible right lower extremity arteries. Unable to obtain TBI due to great toe open wound/bandages. Left: Resting left ankle-brachial index indicates noncompressible left lower extremity arteries. The left toe-brachial index is abnormal." -MRI Foot done and showed no evidence of foot ulcer on exam but did show "No evidence of soft tissue abscess or osteomyelitis in the right forefoot. Suspected subacute fracture of the 5th metatarsal neck. Recommend plain film and clinical correlation. Mild degenerative changes as described. No evidence of metastatic disease in the foot." -Discussed with Vascular Surgery Dr. Lenell Antu and he will see th patient in consultation in the AM  Hypoalbuminemia -Patient's Albumin Trend: Recent Labs  Lab 03/09/23 1326 03/23/23 0725 03/26/23 0300 03/27/23 1245 03/29/23 0410 03/30/23 0353  ALBUMIN 3.6 2.4* 2.1* 2.3* 2.2* 2.3*  -Continue to Monitor and Trend and repeat CMP in the AM  Generalized Weakness and Physical Deconditioning -Will obtain PT and OT to further evaluate and she is status post IV fluid hydration and will reassess in the morning for more fluid  hydration

## 2023-03-28 NOTE — Plan of Care (Signed)
  Problem: Health Behavior/Discharge Planning: Goal: Ability to manage health-related needs will improve Outcome: Progressing   Problem: Clinical Measurements: Goal: Diagnostic test results will improve Outcome: Progressing Goal: Respiratory complications will improve Outcome: Progressing   

## 2023-03-28 NOTE — Progress Notes (Signed)
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  She is having progressive dyspnea and likely due to her pulmonary metastasis and was recently started on supplemental oxygen.  She was also recently started on treatment for urinary tract infection which grew out Enterococcus.  She reports that she is not experiencing any dysuria, fevers, chills, vomiting or diarrhea but has been progressively weaker with increasing fatigue and worsening cancer-related pain.  Her pain does improve with oxycodone at home.  But her sister reported that she was having worsening appetite.  Given her generalized weakness fatigue and pain she is brought to the ED and chest x-ray is notable for stable perihilar nodular opacities.  Blood work was done and showed a worsening AKI on CKD stage IV.  Blood cultures were obtained and she was given 1-1/2 L of LR, fentanyl and IV insulin.  She was transferred for [facility] for further evaluation and admission.  Medical oncology was consulted and are recommending transfusing 2 units of PRBCs.  Patient also has an impending fracture given the metastasis to her right tibia and was supposed to have surgical intervention to repair this.  She was transfused 2 units of PRBCs and the medical oncologist regarding discussed with orthopedic surgeon and recommending that she have a surgical repair of her right lower leg if possible.  Patient states that she feels a little bit better today.  Assessment and  Plan:  AKI superimposed on CKD IV Rhabdomyolysis in the setting of fall Metabolic acidosis -BUN/Cr Trend: Recent Labs  Lab 03/09/23 1326 03/23/23 0725 03/26/23 0300 03/26/23 0324 03/27/23 1245 03/27/23 1750 03/28/23 0418  BUN 120* 90* 99* 113* 97* 97* 93*  CREATININE 3.34* 4.97* 5.19* 5.70* 5.53* 5.11* 5.18*  -Patient is being hydrated with sodium bicarb and 150 mEq in sterile water at 100 mL/h for 20 hours which has not been discontinued -Medical oncology is typing and screening and transfusing 2 units of PRBCs -Patient's rhabdomyolysis is slowly improving and CK went from [phone] and will repeat in the a.m. -Metabolic acidosis on admission which is now slowly improving.  Patient CO2 is now 19, anion gap of 13, chloride level is now 103 -Avoid Nephrotoxic Medications, Contrast Dyes, Hypotension and Dehydration to Ensure Adequate Renal Perfusion and will need to Renally Adjust Meds -Continue to Monitor and Trend Renal Function carefully and repeat CMP in the AM  -Continue with supportive care  Endometrial cancer with metastases - Has received multiple different treatments under the care of Dr. Myna Hidalgo, now wants to focus on quality of life and asks that we consult Palliative Care this admission  -Continue with supportive care and pain control and continue with oxycodone 5 mg p.o. every 4 as needed for moderate pain as well as bowel regimen with senna docusate 1 tab p.o. nightly as needed for mild constipation -See below -Consult Palliative Care, notify Dr. Myna Hidalgo of admission   -Dr. Myna Hidalgo has evaluated and recommending transfusing 2 units of PRBCs and will discuss with orthopedic surgery about her impending leg fracture from the metastasis; she received 20 mg of IV  Lasix between her blood   Hx of DVT and PE  -Continue with enoxaparin 60 mg subcu every 24   Insulin-dependent DM  -A1c was 7.7% in August 2024  -Check CBGs and continue insulin detemir 10 units subcu twice daily  as well as very sensitive NovoLog/scale insulin before meals and at bedtime -Continue monitor CBGs and glucose trends carefully and adjust as necessary -CBG Trend: Recent Labs  Lab 03/02/23 1124 03/20/23 0759 03/27/23 1758 03/27/23 2022 03/28/23 0746 03/28/23 1151 03/28/23 1645  GLUCAP 193* 186* 197* 204* 152* 137* 130*  -Glucose Trend: Recent Labs  Lab 03/09/23 1326 03/23/23 0725 03/26/23 0300 03/26/23 0324 03/27/23 1245 03/27/23 1750 03/28/23 0418  GLUCOSE 127* 228* 203* 199* 317* 218* 173*    Hypertension  -BP was low in ED and antihypertensives held on admission  -Continue to monitor blood pressures per protocol   Depression and anxiety -Continue with venlafaxine XR 37.5 mg p.o. daily with breakfast  Recent Enterococcus UTI -Pansensitive and was placed on amoxicillin which will be continued at 250 mg p.o. every 12 given her renal function -Most recent urinalysis done showed a turbid appearance with 50 glucose, large hemoglobin, large leukocytes, negative nitrites, 100 protein, no bacteria seen, present mucus, present budding yeast, greater than 50 RBCs per high-power field, 0-5 squamous epithelial cells, greater than 50 WBCs -Urine culture is negative and showing no growth given that she was on antibiotics prior to admission -Blood cultures x 2 obtained and showing no growth to date  Normocytic Anemia/Anemia of Chronic Disease -Hgb/Hct Trend:  Recent Labs  Lab 03/09/23 1326 03/23/23 0725 03/26/23 0300 03/26/23 0324 03/27/23 1245 03/27/23 1253 03/28/23 0418  HGB 9.8* 7.6* 7.7* 7.5* 8.4* 8.8* 7.2*  HCT 30.1* 23.4* 24.9* 22.0* 26.4* 26.0* 23.2*  MCV 94.4 93.2 90.5  --  89.2  --  92.4  -Iron/anemia panel was checked and showed an iron level of 44, UIBC 103, TIBC 147, saturation ratios of 30% -Medical oncology was consulted and transfusion patient 2 units of PRBCs -Continue to monitor for signs and symptoms bleeding; no overt bleeding noted-repeat CBC in  a.m.  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 Jerl Santos and he is notify the patient of admission  Hypoalbuminemia -Patient's Albumin Trend: Recent Labs  Lab 03/09/23 1326 03/23/23 0725 03/26/23 0300 03/27/23 1245  ALBUMIN 3.6 2.4* 2.1* 2.3*  -Continue to Monitor and Trend and repeat CMP in the AM  Generalized Weakness and Physical Deconditioning -Will obtain PT and OT to further evaluate and she is status post IV fluid hydration and will reassess in the morning for more fluid hydration   DVT prophylaxis: Enoxaparin 60 mg sq q24h    Code Status: Limited: Do not attempt resuscitation (DNR) -DNR-LIMITED -Do Not Intubate/DNI  Family Communication: Discussed with the patient's sister at bedside  Disposition Plan:  Level of care: Progressive Status is: Inpatient Remains inpatient appropriate because: His further clinical improvement and clearance by specialist and further evaluation and an improvement in her renal function   Consultants:  Medical Oncology  Procedures:  As delineated as above  Antimicrobials:  Anti-infectives (From admission, onward)    Start     Dose/Rate Route Frequency Ordered Stop  03/28/23 1000  amoxicillin (AMOXIL) capsule 250 mg        250 mg Oral Every 12 hours 03/28/23 0640         Subjective: Seen and examined at bedside and thinks she is feeling a little bit better today.  No nausea or vomiting.  Feels a little bit stronger.  Continues to have stomach pain and states that she has pain in her arm and wrist.  No other concerns or complaints at this time.  Objective: Vitals:   03/28/23 1410 03/28/23 1634 03/28/23 1639 03/28/23  1655  BP: 110/74 116/73 116/73 (!) 131/90  Pulse: 96 95 95 91  Resp: 20  (!) 22 20  Temp: 99.1 F (37.3 C) 98.8 F (37.1 C) 98.8 F (37.1 C) 98.8 F (37.1 C)  TempSrc: Oral Oral Oral Oral  SpO2: 93% 97% 97% 98%  Weight:      Height:        Intake/Output Summary (Last 24 hours) at 03/28/2023 1924 Last data filed at 03/28/2023 1847 Gross per 24 hour  Intake 2599.17 ml  Output 1400 ml  Net 1199.17 ml   Filed Weights   03/27/23 1217 03/28/23 0155  Weight: 65 kg 67.5 kg   Examination: Physical Exam:  Constitutional: Overweight chronically ill-appearing Caucasian female who appears a little uncomfortable Respiratory: Diminished to auscultation bilaterally, no wheezing, rales, rhonchi or crackles. Normal respiratory effort and patient is not tachypenic. No accessory muscle use.  Unlabored breathing Cardiovascular: RRR, no murmurs / rubs / gallops. S1 and S2 auscultated. No extremity edema.  Abdomen: Soft, non-tender, distended secondary to body habitus. Bowel sounds positive.  GU: Deferred. Musculoskeletal: No clubbing / cyanosis of digits/nails. No joint deformity upper and lower extremities.  Skin: No rashes, lesions, ulcers on limited skin evaluation. No induration; Warm and dry.  Neurologic: CN 2-12 grossly intact with no focal deficits. Romberg sign and cerebellar reflexes not assessed.  Psychiatric: Normal judgment and insight. Alert and oriented x 3. Normal mood and appropriate affect.   Data Reviewed: I have personally reviewed following labs and imaging studies  CBC: Recent Labs  Lab 03/23/23 0725 03/26/23 0300 03/26/23 0324 03/27/23 1245 03/27/23 1253 03/28/23 0418  WBC 5.2 5.4  --  6.3  --  4.3  NEUTROABS 4.1  --   --  4.9  --   --   HGB 7.6* 7.7* 7.5* 8.4* 8.8* 7.2*  HCT 23.4* 24.9* 22.0* 26.4* 26.0* 23.2*  MCV 93.2 90.5  --  89.2  --  92.4  PLT 198 181  --  256  --  182   Basic Metabolic Panel: Recent Labs  Lab 03/23/23 0725 03/26/23 0300 03/26/23 0324  03/27/23 1245 03/27/23 1253 03/27/23 1750 03/28/23 0418  NA 131* 131* 133* 133* 134* 133* 135  K 5.2* 4.5 4.7 5.1 5.1 4.9 4.3  CL 104 103 106 104  --  104 103  CO2 14* 15*  --  14*  --  16* 19*  GLUCOSE 228* 203* 199* 317*  --  218* 173*  BUN 90* 99* 113* 97*  --  97* 93*  CREATININE 4.97* 5.19* 5.70* 5.53*  --  5.11* 5.18*  CALCIUM 8.3* 7.9*  --  8.1*  --  8.0* 7.9*   GFR: Estimated Creatinine Clearance: 11.2 mL/min (A) (by C-G formula based on SCr of 5.18 mg/dL (H)). Liver Function Tests: Recent Labs  Lab 03/23/23 0725 03/26/23 0300 03/27/23 1245  AST 13* 62* 74*  ALT 9 17 32  ALKPHOS 122  114 111  BILITOT 0.4 0.2* 0.2*  PROT 6.7 6.3* 6.8  ALBUMIN 2.4* 2.1* 2.3*   No results for input(s): "LIPASE", "AMYLASE" in the last 168 hours. No results for input(s): "AMMONIA" in the last 168 hours. Coagulation Profile: Recent Labs  Lab 03/27/23 1258  INR 1.2   Cardiac Enzymes: Recent Labs  Lab 03/26/23 0300 03/27/23 1330  CKTOTAL 3,764* 2,751*   BNP (last 3 results) No results for input(s): "PROBNP" in the last 8760 hours. HbA1C: No results for input(s): "HGBA1C" in the last 72 hours. CBG: Recent Labs  Lab 03/27/23 1758 03/27/23 2022 03/28/23 0746 03/28/23 1151 03/28/23 1645  GLUCAP 197* 204* 152* 137* 130*   Lipid Profile: No results for input(s): "CHOL", "HDL", "LDLCALC", "TRIG", "CHOLHDL", "LDLDIRECT" in the last 72 hours. Thyroid Function Tests: No results for input(s): "TSH", "T4TOTAL", "FREET4", "T3FREE", "THYROIDAB" in the last 72 hours. Anemia Panel: Recent Labs    03/28/23 0736  TIBC 147*  IRON 44   Sepsis Labs: Recent Labs  Lab 03/23/23 0743 03/26/23 0324 03/27/23 1258 03/27/23 1443  LATICACIDVEN 1.6 0.7 1.8 1.4    Recent Results (from the past 240 hour(s))  SARS Coronavirus 2 by RT PCR (hospital order, performed in [facility] hospital lab) *cepheid single result test* Anterior Nasal Swab     Status: None   Collection Time: 03/23/23   7:26 AM   Specimen: Anterior Nasal Swab  Result Value Ref Range Status   SARS Coronavirus 2 by RT PCR NEGATIVE NEGATIVE Final    Comment: (NOTE) SARS-CoV-2 target nucleic acids are NOT DETECTED.  The SARS-CoV-2 RNA is generally detectable in upper and lower respiratory specimens during the acute phase of infection. The lowest concentration of SARS-CoV-2 viral copies this assay can detect is 250 copies / mL. A negative result does not preclude SARS-CoV-2 infection and should not be used as the sole basis for treatment or other patient management decisions.  A negative result may occur with improper specimen collection / handling, submission of specimen other than nasopharyngeal swab, presence of viral mutation(s) within the areas targeted by this assay, and inadequate number of viral copies (<250 copies / mL). A negative result must be combined with clinical observations, patient history, and epidemiological information.  Fact Sheet for Patients:   RoadLapTop.co.za  Fact Sheet for Healthcare Providers: http://kim-miller.com/  This test is not yet approved or  cleared by the [place] FDA and has been authorized for detection and/or diagnosis of SARS-CoV-2 by FDA under an Emergency Use Authorization (EUA).  This EUA will remain in effect (meaning this test can be used) for the duration of the COVID-19 declaration under Section 564(b)(1) of the Act, 21 U.S.C. section 360bbb-3(b)(1), unless the authorization is terminated or revoked sooner.  Performed at [facility], [address] [place], [place] [address]   Blood culture (routine x 2)     Status: None   Collection Time: 03/23/23  7:40 AM   Specimen: BLOOD  Result Value Ref Range Status   Specimen Description   Final    BLOOD LEFT ANTECUBITAL Performed at [facility], [address] [place], [place] [address]    Special Requests   Final    BOTTLES  DRAWN AEROBIC AND ANAEROBIC Blood Culture results may not be optimal due to an inadequate volume of blood received in culture bottles Performed at [facility], [address] [place], [place] [address]    Culture   Final  NO GROWTH 5 DAYS Performed at [facility], [address], [place], [place] [address]    Report Status 03/28/2023 FINAL  Final  Blood culture (routine x 2)     Status: None   Collection Time: 03/23/23  7:55 AM   Specimen: BLOOD  Result Value Ref Range Status   Specimen Description   Final    BLOOD RIGHT ANTECUBITAL Performed at [facility], [address] [place], [place] [address]    Special Requests   Final    BOTTLES DRAWN AEROBIC AND ANAEROBIC Blood Culture adequate volume Performed at [facility], [address] [place], [place] [address]    Culture   Final    NO GROWTH 5 DAYS Performed at [facility], [address], [place], [place] [address]    Report Status 03/28/2023 FINAL  Final  Urine Culture     Status: Abnormal   Collection Time: 03/23/23 10:45 AM   Specimen: Urine, Random  Result Value Ref Range Status   Specimen Description   Final    URINE, RANDOM Performed at [facility], [address] [place], [place] [address]    Special Requests   Final    NONE Reflexed from [id number] Performed at [facility], [address] [place], [place] [address]    Culture (A)  Final    50,000 COLONIES/mL ENTEROCOCCUS FAECALIS 50,000 COLONIES/mL DIPHTHEROIDS(CORYNEBACTERIUM SPECIES) Standardized susceptibility testing for this organism is not available. Performed at [facility], [address] [place], [place] [address]    Report Status 03/25/2023 FINAL  Final   Organism ID, Bacteria ENTEROCOCCUS FAECALIS (A)  Final      Susceptibility   Enterococcus faecalis - MIC*    AMPICILLIN <=2 SENSITIVE Sensitive     NITROFURANTOIN <=16 SENSITIVE Sensitive     VANCOMYCIN 2  SENSITIVE Sensitive     * 50,000 COLONIES/mL ENTEROCOCCUS FAECALIS  Blood Culture (routine x 2)     Status: None (Preliminary result)   Collection Time: 03/27/23 12:44 PM   Specimen: BLOOD  Result Value Ref Range Status   Specimen Description   Final    BLOOD RIGHT ANTECUBITAL Performed at [facility], [address] [place], [place] [address]    Special Requests   Final    BOTTLES DRAWN AEROBIC AND ANAEROBIC Blood Culture adequate volume Performed at [facility], [address] [place], [place] [address]    Culture   Final    NO GROWTH < 24 HOURS Performed at [facility], [address] [place], [place] [address]    Report Status PENDING  Incomplete  Blood Culture (routine x 2)     Status: None (Preliminary result)   Collection Time: 03/27/23  1:17 PM   Specimen: BLOOD  Result Value Ref Range Status   Specimen Description   Final    BLOOD LEFT ANTECUBITAL Performed at [facility], [address] [place], [place] [address]    Special Requests   Final    BOTTLES DRAWN AEROBIC ONLY Blood Culture adequate volume Performed at [facility], [address] [place], [place] [address]    Culture   Final    NO GROWTH < 24 HOURS Performed at [facility], [address] [place], [place] [address]    Report Status PENDING  Incomplete  Urine Culture  Status: None   Collection Time: 03/27/23  6:18 PM   Specimen: Urine, Random  Result Value Ref Range Status   Specimen Description   Final    URINE, RANDOM Performed at [facility], [address], [place], [place] [address]    Special Requests   Final    NONE Reflexed from [id number] Performed at [facility], [address], [place], [place] [address]    Culture   Final    NO GROWTH Performed at [facility], [address], [place], [place] [address]    Report Status 03/28/2023 FINAL  Final    Radiology Studies: DG  Chest Port 1 View  Result Date: 03/27/2023 CLINICAL DATA:  Sepsis.  Fall yesterday. EXAM: PORTABLE CHEST 1 VIEW COMPARISON:  X-ray 02/20/2023. CT scan chest abdomen pelvis 03/26/2023 FINDINGS: Right IJ chest port with tip at the central SVC. Underinflation. No pneumothorax, effusion. Overlapping cardiac leads. Stable cardiopericardial silhouette. Perihilar nodular contour opacities are once again identified, similar to the previous CT scan. Mild adjacent ground-glass interstitial type changes. IMPRESSION: Stable perihilar nodular opacities. Please correlate with the prior CT. No pneumothorax or effusion. Chest port Electronically Signed   By: Karen Kays M.D.   On: 03/27/2023 14:52    Scheduled Meds:  amoxicillin  250 mg Oral Q12H   Chlorhexidine Gluconate Cloth  6 each Topical Daily   enoxaparin  60 mg Subcutaneous Q24H   furosemide  20 mg Intravenous Once   gabapentin  300 mg Oral QHS   insulin aspart  0-5 Units Subcutaneous QHS   insulin aspart  0-6 Units Subcutaneous TID WC   insulin detemir  10 Units Subcutaneous BID   oxybutynin  10 mg Oral Daily   sodium chloride flush  10-40 mL Intracatheter Q12H   sodium chloride flush  3 mL Intravenous Q12H   venlafaxine XR  37.5 mg Oral Q breakfast   Continuous Infusions:   LOS: 1 day   Marguerita Merles, DO [facility] Available via Epic secure chat 7am-7pm After these hours, please refer to coverage provider listed on [email or web address] 03/28/2023, 7:24 PM

## 2023-03-28 NOTE — Consult Note (Signed)
Jaime Gomez is well-known to me.  She is a very nice 57 year old white female.  She has metastatic adenocarcinoma of the endometrium.  Her problem right now is that she has a metastasis to the right tibia.  She has an impending fracture.  She is to have surgery to repair this.  Unfortunately, she has been hospitalized frequently.  I saw her in the office on 30 August.  At that time, she had been in the ER with weakness.  Not surprising, she had another urinary tract infection.  She is growing Enterococcus faecalis.  We did have her on some antibiotics for this..  She was scheduled for surgery to repair the impending fracture yesterday.  However, we have put that off for about a week or so because of this infection.  She got weak at home again.  This has been a recurring problem for her.  Her blood was quite low which is also not a surprise.  She does not wish to have dialysis because of her renal insufficiency.  She subsequently was brought to the ER and admitted.  She does have ureteral stents in.  This is chronic.  When she came in, she had a CT scan of the cervical spine and head.  Both were unremarkable.  Her last CA-125 was 550.  We have tried to employ systemic chemotherapy.  Her, this is being fairly difficult because she has had these infections and has been hospitalized.  This morning, her white count is 4.3.  Hemoglobin 7.2.  Platelet count 182,000.  Her sodium 135.  Potassium 4.3.  BUN 93 creatinine 5.18.  Blood sugar is 173.  Her blood sugars have also been an issue for her.  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  She currently is on Lovenox.   She has had no fever.  She has had no vomiting.  Her appetite is okay.  She has had no diarrhea.  She still makes urine.  Pain has been an issue.  She has chronic pain in the abdomen because of these stents.  She walks with a rolling walker to put pressure off her right leg.    On her physical exam, her temperature is 97.9.   Pulse 85.  Blood pressure 120/80.  Her head and exam shows no ocular or oral lesions.  She has no adenopathy in the neck.  Lungs are relatively clear bilaterally.  Cardiac exam regular rate and rhythm.  She has a 1/6 systolic ejection murmur.  Abdomen is soft.  There is no fluid wave in the abdomen.  There is no guarding or rebound tenderness.  There may be a little bit of tenderness to palpation in the lower abdomen.  Bowel sounds are present.  There is no abdominal mass.  There is no palpable liver or spleen tip.  Extremities shows no clubbing, cyanosis or edema.  She has little bit of tenderness in the right lower leg.  Neurological exam shows no focal neurological deficits.   Jaime Gomez is a very nice 57 year old white female.  She has metastatic endometrial carcinoma.  She unfortunately, does not have any actionable mutations that we can target.  I am have been thinking about using Adriamycin on her.  However, she keeps being hospitalized with problems.  I think while she is hospitalized this time, we should really see if she can have surgery to repair the impending fracture in the right lower leg.  I think the problem that we may have with this is that she  has a UTI.  I am not sure if Orthopedic Surgery would want to put in a metal rod with her having an infection.  She needs to be transfused.  I will going give her 2 units of blood.  We are getting to the point where we just may not be able to treat her for the cancer.  If that is the case, then we will have to discuss with her end-of-life issues.  I know this would be very difficult.  For right now, we will see about improving her renal function.  Again he should be transfused.  Will see what her cultures show.  I do not think be about to start her empirically on antibiotics.  I will have to speak with Dr. Jerl Santos for Orthopedic Surgery.  He is the one who is scheduled to do her surgery.  I will let him know that she is in the hospital.  I  know that she will get incredible care from all the staff on [address].  I know that they are incredibly dedicated a compassionate.   [name], MD  [address] Remi Deter 2:2

## 2023-03-29 ENCOUNTER — Other Ambulatory Visit: Payer: Self-pay | Admitting: Orthopaedic Surgery

## 2023-03-29 ENCOUNTER — Inpatient Hospital Stay (HOSPITAL_COMMUNITY): Payer: 59

## 2023-03-29 DIAGNOSIS — N179 Acute kidney failure, unspecified: Secondary | ICD-10-CM | POA: Diagnosis not present

## 2023-03-29 DIAGNOSIS — F321 Major depressive disorder, single episode, moderate: Secondary | ICD-10-CM | POA: Diagnosis not present

## 2023-03-29 DIAGNOSIS — C7802 Secondary malignant neoplasm of left lung: Secondary | ICD-10-CM | POA: Diagnosis not present

## 2023-03-29 DIAGNOSIS — C7801 Secondary malignant neoplasm of right lung: Secondary | ICD-10-CM | POA: Diagnosis not present

## 2023-03-29 DIAGNOSIS — J9611 Chronic respiratory failure with hypoxia: Secondary | ICD-10-CM | POA: Diagnosis not present

## 2023-03-29 LAB — COMPREHENSIVE METABOLIC PANEL
ALT: 24 U/L (ref 0–44)
AST: 41 U/L (ref 15–41)
Albumin: 2.2 g/dL — ABNORMAL LOW (ref 3.5–5.0)
Alkaline Phosphatase: 93 U/L (ref 38–126)
Anion gap: 15 (ref 5–15)
BUN: 84 mg/dL — ABNORMAL HIGH (ref 6–20)
CO2: 21 mmol/L — ABNORMAL LOW (ref 22–32)
Calcium: 7.8 mg/dL — ABNORMAL LOW (ref 8.9–10.3)
Chloride: 101 mmol/L (ref 98–111)
Creatinine, Ser: 4.39 mg/dL — ABNORMAL HIGH (ref 0.44–1.00)
GFR, Estimated: 11 mL/min — ABNORMAL LOW (ref >60)
Glucose, Bld: 69 mg/dL — ABNORMAL LOW (ref 70–99)
Potassium: 3.4 mmol/L — ABNORMAL LOW (ref 3.5–5.1)
Sodium: 137 mmol/L (ref 135–145)
Total Bilirubin: 0.5 mg/dL (ref 0.3–1.2)
Total Protein: 5.8 g/dL — ABNORMAL LOW (ref 6.5–8.1)

## 2023-03-29 LAB — CBC WITH DIFFERENTIAL/PLATELET
Abs Immature Granulocytes: 0.11 10*3/uL — ABNORMAL HIGH (ref 0.00–0.07)
Basophils Absolute: 0 10*3/uL (ref 0.0–0.1)
Basophils Relative: 0 %
Eosinophils Absolute: 0.1 10*3/uL (ref 0.0–0.5)
Eosinophils Relative: 2 %
HCT: 27.2 % — ABNORMAL LOW (ref 36.0–46.0)
Hemoglobin: 8.9 g/dL — ABNORMAL LOW (ref 12.0–15.0)
Immature Granulocytes: 2 %
Lymphocytes Relative: 8 %
Lymphs Abs: 0.5 10*3/uL — ABNORMAL LOW (ref 0.7–4.0)
MCH: 28.2 pg (ref 26.0–34.0)
MCHC: 32.7 g/dL (ref 30.0–36.0)
MCV: 86.1 fL (ref 80.0–100.0)
Monocytes Absolute: 0.5 10*3/uL (ref 0.1–1.0)
Monocytes Relative: 8 %
Neutro Abs: 5.3 10*3/uL (ref 1.7–7.7)
Neutrophils Relative %: 80 %
Platelets: 187 10*3/uL (ref 150–400)
RBC: 3.16 MIL/uL — ABNORMAL LOW (ref 3.87–5.11)
RDW: 19.6 % — ABNORMAL HIGH (ref 11.5–15.5)
WBC: 6.6 10*3/uL (ref 4.0–10.5)
nRBC: 0 % (ref 0.0–0.2)

## 2023-03-29 LAB — BPAM RBC
Blood Product Expiration Date: 202409112359
Blood Product Expiration Date: 202409192359
ISSUE DATE / TIME: 202409041338
ISSUE DATE / TIME: 202409041625
Unit Type and Rh: 6200
Unit Type and Rh: 6200

## 2023-03-29 LAB — GLUCOSE, CAPILLARY
Glucose-Capillary: 56 mg/dL — ABNORMAL LOW (ref 70–99)
Glucose-Capillary: 60 mg/dL — ABNORMAL LOW (ref 70–99)
Glucose-Capillary: 63 mg/dL — ABNORMAL LOW (ref 70–99)
Glucose-Capillary: 88 mg/dL (ref 70–99)
Glucose-Capillary: 122 mg/dL — ABNORMAL HIGH (ref 70–99)
Glucose-Capillary: 144 mg/dL — ABNORMAL HIGH (ref 70–99)
Glucose-Capillary: 200 mg/dL — ABNORMAL HIGH (ref 70–99)
Glucose-Capillary: 253 mg/dL — ABNORMAL HIGH (ref 70–99)

## 2023-03-29 LAB — TYPE AND SCREEN
ABO/RH(D): A POS
Antibody Screen: POSITIVE
DAT, IgG: NEGATIVE
Donor AG Type: NEGATIVE
Donor AG Type: NEGATIVE
Unit division: 0
Unit division: 0

## 2023-03-29 LAB — PHOSPHORUS: Phosphorus: 5.9 mg/dL — ABNORMAL HIGH (ref 2.5–4.6)

## 2023-03-29 LAB — MAGNESIUM: Magnesium: 1.5 mg/dL — ABNORMAL LOW (ref 1.7–2.4)

## 2023-03-29 LAB — PREALBUMIN: Prealbumin: 9 mg/dL — ABNORMAL LOW (ref 18–38)

## 2023-03-29 LAB — CK: Total CK: 803 U/L — ABNORMAL HIGH (ref 38–234)

## 2023-03-29 MED ORDER — GLUCOSE 4 G PO CHEW
CHEWABLE_TABLET | ORAL | Status: AC
  Filled 2023-03-29: qty 1

## 2023-03-29 MED ORDER — SODIUM CHLORIDE 0.9 % IV BOLUS
1000.0000 mL | Freq: Once | INTRAVENOUS | Status: DC
Start: 2023-03-29 — End: 2023-03-29

## 2023-03-29 MED ORDER — MAGNESIUM SULFATE 2 GM/50ML IV SOLN
2.0000 g | Freq: Once | INTRAVENOUS | Status: AC
  Administered 2023-03-29: 2 g via INTRAVENOUS
  Filled 2023-03-29: qty 50

## 2023-03-29 MED ORDER — POTASSIUM CHLORIDE CRYS ER 20 MEQ PO TBCR
40.0000 meq | EXTENDED_RELEASE_TABLET | Freq: Two times a day (BID) | ORAL | Status: AC
  Administered 2023-03-29 (×2): 40 meq via ORAL
  Filled 2023-03-29 (×2): qty 2

## 2023-03-29 MED ORDER — GLUCOSE 4 G PO CHEW
1.0000 | CHEWABLE_TABLET | Freq: Once | ORAL | Status: AC
  Administered 2023-03-30: 4 g via ORAL

## 2023-03-29 MED ORDER — SODIUM CHLORIDE 0.9 % IV BOLUS
500.0000 mL | Freq: Once | INTRAVENOUS | Status: AC
  Administered 2023-03-29: 500 mL via INTRAVENOUS

## 2023-03-29 NOTE — Progress Notes (Signed)
Ms. Bertelli feels little bit better.  She had 2 units of blood yesterday.  Her "color" looks a little bit better.  Her renal function improved.  Her BUN is 84 creatinine 4.39.  Sodium 137.  Potassium 3.4.  Her magnesium is 1.5.  Albumin is only 2.2.  I will check a prealbumin on her.  Her CBC shows a white cell count of 6.6.  Hemoglobin 8.9.  Platelet count 187,000.  She still has little bit of abdominal pain.  This is more chronic.  Her recent urinalysis and urine culture does not show a UTI.  I am happy about this.  She still needs to have the surgery for her right tibia.  I am unsure if this will happen while she is in the hospital.  I know that Orthopedic surgery has not yet been able to see her.  Her appetite is down a little bit.  Hopefully she will be able to eat a little bit more today.  Her vital signs show temperature 98.3.  Pulse 77.  Blood pressure 137/89.  Her lungs sound clear bilaterally.  There is no wheezing.  Cardiac exam regular rate and rhythm.  She has no murmurs.  Abdomen is soft.  Bowel sounds are present.  There is no fluid wave.  There is no guarding.  There is no palpable abdominal mass.  There is no hepatosplenomegaly.  Extremities shows some tenderness in the right lower leg.  She has decent pulses in her distal extremities.  Neurological exam is nonfocal.  This, does look better.  There is a transfusion helped her out quite a bit.  It would be interesting to see what the prealbumin shows.  It would really be nice if Orthopedic Surgery could operate on her while she is in the hospital.  She does not have any positive cultures now.  We will continue to follow along.  I know she is getting great care from everybody on [address].    [name], MD  Charmian Muff 6:2

## 2023-03-29 NOTE — Progress Notes (Signed)
Palliative-  Consult received, chart reviewed.   Spoke with patient's sister- [name].   Plan to meet tomorrow at 1pm at bedside.   Ocie Bob, AGNP-C Palliative Medicine  No charge

## 2023-03-29 NOTE — TOC Initial Note (Signed)
Transition of Care [facility]) - Initial/Assessment Note    Patient Details  Name: Jaime Gomez MRN: [id number] Date of Birth: 1966-02-03  Transition of Care [facility]) CM/SW Contact:    Larrie Kass, LCSW Phone Number: 03/29/2023, 3:32 PM  Clinical Narrative:                 Pt is active with [facility] services. Per chart review palliative consulted. TOC to follow for d/c needs.   Expected Discharge Plan:  (TBD) Barriers to Discharge: Continued Medical Work up   Patient Goals and CMS Choice            Expected Discharge Plan and Services In-house Referral: Clinical Social Work     Living arrangements for the past 2 months: Single Family Home                                      Prior Living Arrangements/Services Living arrangements for the past 2 months: Single Family Home Lives with:: Self, Siblings Patient language and need for interpreter reviewed:: Yes              Criminal Activity/Legal Involvement Pertinent to Current Situation/Hospitalization: No - Comment as needed  Activities of Daily Living Home Assistive Devices/Equipment: Eyeglasses, CBG Meter, Cane (specify quad or straight), Wheelchair, Environmental consultant (specify type), Shower chair without back, Hand-held shower hose, Grab bars around toilet, Other (Comment) (stair lift) ADL Screening (condition at time of admission) Patient's cognitive ability adequate to safely complete daily activities?: Yes Is the patient deaf or have difficulty hearing?: No Does the patient have difficulty seeing, even when wearing glasses/contacts?: No Does the patient have difficulty concentrating, remembering, or making decisions?: Yes Patient able to express need for assistance with ADLs?: Yes Does the patient have difficulty dressing or bathing?: No Independently performs ADLs?: Yes (appropriate for developmental age) Does the patient have difficulty walking or climbing stairs?: Yes Weakness of Legs: Both Weakness of  Arms/Hands: None  Permission Sought/Granted                  Emotional Assessment              Admission diagnosis:  AKI (acute kidney injury) (HCC) [N17.9] Malignant neoplasm metastatic to both lungs (HCC) [C78.01, C78.02] Hypotension, unspecified hypotension type [I95.9] Iron deficiency anemia, unspecified iron deficiency anemia type [D50.9] Patient Active Problem List   Diagnosis Date Noted   Malignant neoplasm metastatic to both lungs (HCC) 03/28/2023   Chronic respiratory failure with hypoxia (HCC) 03/27/2023   Rhabdomyolysis 03/27/2023   Mixed stress and urge urinary incontinence 03/01/2023   Constipation 02/09/2023   Cancer associated pain 02/09/2023   High risk medication use 02/09/2023   DNR (do not resuscitate) 02/09/2023   Need for emotional support 02/09/2023   Acute renal failure superimposed on stage 4 chronic kidney disease (HCC) 02/09/2023   Counseling and coordination of care 02/09/2023   Medication management 02/09/2023   Palliative care encounter 02/09/2023   Pulmonary embolism (HCC) 02/09/2023   Left lateral abdominal pain 02/09/2023   Pulmonary emboli (HCC) 02/08/2023   Abnormal urinalysis 02/08/2023   Leg mass 02/08/2023   CKD (chronic kidney disease) stage 4, GFR 15-29 ml/min (HCC) 12/27/2022   Depression, major, single episode, moderate (HCC) 12/02/2022   Sepsis secondary to UTI (HCC) 11/28/2022   Sepsis due to gram-negative UTI (HCC) 11/28/2022   Abnormality of lung on CXR 11/28/2022  Metabolic acidosis 11/28/2022   IDA (iron deficiency anemia) 04/28/2022   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   Hyperkalemia 09/02/2021   Acute renal failure superimposed on stage 3a chronic kidney disease (HCC) 09/01/2021   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) 09/01/2021   Anemia due to antineoplastic chemotherapy (CODE) 09/01/2021   Chronic kidney disease, stage 3a (HCC) 09/01/2021   Primary hypertension 09/01/2021   Gastroparesis  09/01/2021   Personal history of other venous thrombosis and embolism 09/01/2021   Personal history of pulmonary embolism 09/01/2021   Type 2 diabetes mellitus without complications (HCC) 09/01/2021   Endometrial cancer, FIGO stage IVB (HCC) 08/18/2021   Goals of care, counseling/discussion 08/18/2021   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 failure 08/18/2021   Leg DVT (deep venous thromboembolism), chronic, right (HCC) 08/18/2021   PCP:  Karie Georges, MD Pharmacy:   [phone] - [place], [place] - [address] AT [facility] [address] [place] [address] Phone: [phone] Fax: [phone]     Social Determinants of Health (SDOH) Social History: SDOH Screenings   Food Insecurity: No Food Insecurity (03/27/2023)  Housing: Low Risk  (03/27/2023)  Transportation Needs: No Transportation Needs (03/27/2023)  Utilities: Not At Risk (03/27/2023)  Depression (PHQ2-9): Low Risk  (03/01/2023)  Recent Concern: Depression (PHQ2-9) - High Risk (12/26/2022)  Financial Resource Strain: Low Risk  (06/27/2022)  Social Connections: Unknown (12/06/2021)   Received from [facility]  Stress: No Stress Concern Present (06/27/2022)  Tobacco Use: Low Risk  (03/27/2023)   SDOH Interventions:     Readmission Risk Interventions    02/10/2023    9:27 AM 11/30/2022   10:29 AM  Readmission Risk Prevention Plan  Transportation Screening Complete Complete  PCP or Specialist Appt within 3-5 Days  Complete  HRI or Home Care Consult  Complete  Social Work Consult for Recovery Care Planning/Counseling  Complete  Palliative Care Screening  Not Applicable  Medication Review Oceanographer) Complete Complete  PCP or Specialist appointment within 3-5 days of discharge Complete   HRI or Home Care Consult Complete   SW Recovery Care/Counseling Consult Complete   Palliative Care Screening Not Applicable   Skilled Nursing Facility Not Applicable

## 2023-03-29 NOTE — Inpatient Diabetes Management (Signed)
Inpatient Diabetes Program Recommendations  AACE/ADA: New Consensus Statement on Inpatient Glycemic Control (2015)  Target Ranges:  Prepandial:   less than 140 mg/dL      Peak postprandial:   less than 180 mg/dL (1-2 hours)      Critically ill patients:  140 - 180 mg/dL   Lab Results  Component Value Date   GLUCAP 122 (H) 03/29/2023   HGBA1C 7.7 (H) 03/20/2023    Review of Glycemic Control  Latest Reference Range & Units 03/29/23 07:32 03/29/23 07:45 03/29/23 08:04 03/29/23 08:25 03/29/23 09:45  Glucose-Capillary 70 - 99 mg/dL 56 (L) 60 (L) 63 (L) 88 122 (H)  (L): Data is abnormally low (H): Data is abnormally high  Diabetes history: DM2 Outpatient Diabetes medications:  Levemir 10-20 units every day, Novolog 0-10 units TID Current orders for Inpatient glycemic control: Levemir 10 units every day, Novolog 0-6 units TID and 0-5 units QHS  Inpatient Diabetes Program Recommendations:    Hypoglycemia this morning of 56 mg/dL.  Please consider:  Levemir 10 units every day (has received am dose this morning)  Will continue to follow while inpatient.  Thank you, Dulce Sellar, MSN, CDCES Diabetes Coordinator Inpatient Diabetes Program [phone] (team pager from 8a-5p)

## 2023-03-29 NOTE — Progress Notes (Signed)
Hypoglycemic Event  CBG: 56  Treatment: 4 oz juice/soda  Symptoms: Shaky and headache  Follow-up CBG: Time:0745 CBG Result:60  Possible Reasons for Event: Inadequate meal intake  Comments/MD notified: Marguerita Merles Latif   Repeat CBG treatment 4oz of apple juice  Follow up cbg 64  Treatment:  Glucose tab -see MAR  Follow up cbg 88  Pt ate 40% of breakfast  Renda Rolls

## 2023-03-29 NOTE — Plan of Care (Signed)
  Problem: Clinical Measurements: Goal: Cardiovascular complication will be avoided Outcome: Progressing   Problem: Coping: Goal: Level of anxiety will decrease Outcome: Progressing   Problem: Elimination: Goal: Will not experience complications related to urinary retention Outcome: Progressing   Problem: Pain Managment: Goal: General experience of comfort will improve Outcome: Progressing   Problem: Safety: Goal: Ability to remain free from injury will improve Outcome: Progressing   Problem: Skin Integrity: Goal: Risk for impaired skin integrity will decrease Outcome: Progressing

## 2023-03-29 NOTE — Evaluation (Signed)
Physical Therapy Evaluation Patient Details Name: Jaime Gomez MRN: [id number] DOB: Nov 06, 1965 Today's Date: 03/29/2023  History of Present Illness  57 year old Caucasian female with past medical history significant for but not limited to hypertension, insulin-dependent diabetes mellitus type 2, depression, malignant hydronephrosis with right ureteral stent, CKD stage IV, endometrial cancer with metastasis, history of PE and DVT on anticoagulants enoxaparin as well as other comorbidities who presented with progressive generalized weakness, fatigue and pain.  She is having progressive dyspnea and likely due to her pulmonary metastasis and was recently started on supplemental oxygen.  Clinical Impression  Pt admitted with above diagnosis.  Pt currently with functional limitations due to the deficits listed below (see PT Problem List). Pt will benefit from acute skilled PT to increase their independence and safety with mobility to allow discharge.  Pt living with her sister prior to admission.  Pt with pending palliative and ortho consults.  Pt assisted OOB to recliner today.  Will continue to assist with safe mobility in acute setting in preparation for return home with 24/7 supervision.         If plan is discharge home, recommend the following: A little help with walking and/or transfers;A little help with bathing/dressing/bathroom;Help with stairs or ramp for entrance;Assistance with cooking/housework   Can travel by private vehicle        Equipment Recommendations None recommended by PT  Recommendations for Other Services       Functional Status Assessment Patient has had a recent decline in their functional status and demonstrates the ability to make significant improvements in function in a reasonable and predictable amount of time.     Precautions / Restrictions Precautions Precautions: Fall;Other (comment) Precaution Comments: She has a left tibia mestastic lesion given her  outpatient workup and she was to have surgical intervention for repair of this and now she was post to have surgical repair on the impending fracture on 03/27/2023 but was hospitalized.  Awaiting Ortho input but pt reports WBAT with walker prior to this admission Restrictions Weight Bearing Restrictions: No      Mobility  Bed Mobility Overal bed mobility: Needs Assistance Bed Mobility: Supine to Sit     Supine to sit: Supervision, HOB elevated          Transfers Overall transfer level: Needs assistance Equipment used: Rolling walker (2 wheels) Transfers: Sit to/from Stand, Bed to chair/wheelchair/BSC Sit to Stand: Contact guard assist   Step pivot transfers: Contact guard assist       General transfer comment: CGA assist provided for safety, pt with mild dizziness and feeling cold, also had lunch waiting so only transferred to recliner today; SPO2 97% on 2L O2 [place] after transfer    Ambulation/Gait                  Stairs            Wheelchair Mobility     Tilt Bed    Modified Rankin (Stroke Patients Only)       Balance Overall balance assessment: History of Falls                                           Pertinent Vitals/Pain      Home Living Family/patient expects to be discharged to:: Private residence Living Arrangements: Other relatives Available Help at Discharge: Family;Available PRN/intermittently (Sitter mon-fri 8-3 on weekdays) Type of  Home: House Home Access: Level entry     Alternate Level Stairs-Number of Steps: flight Home Layout: Two level Home Equipment: Agricultural consultant (2 wheels);Wheelchair - manual;BSC/3in1;Hospital bed;Grab bars - tub/shower;Grab bars - toilet;Shower seat;Other (comment) Additional Comments: home oxygen 2L    Prior Function Prior Level of Function : Independent/Modified Independent                     Extremity/Trunk Assessment   Upper Extremity Assessment Upper Extremity  Assessment: Generalized weakness    Lower Extremity Assessment Lower Extremity Assessment: Generalized weakness;LLE deficits/detail LLE Deficits / Details: left tibia mestastic lesion, pending surgery?       Communication   Communication Communication: No apparent difficulties  Cognition                                                General Comments      Exercises     Assessment/Plan    PT Assessment Patient needs continued PT services  PT Problem List Decreased strength;Decreased activity tolerance;Decreased mobility;Decreased knowledge of use of DME       PT Treatment Interventions DME instruction;Gait training;Balance training;Functional mobility training;Therapeutic activities;Therapeutic exercise;Patient/family education    PT Goals (Current goals can be found in the Care Plan section)  Acute Rehab PT Goals PT Goal Formulation: With patient Time For Goal Achievement: 04/12/23 Potential to Achieve Goals: Good    Frequency Min 1X/week     Co-evaluation PT/OT/SLP Co-Evaluation/Treatment: Yes Reason for Co-Treatment: Complexity of the patient's impairments (multi-system involvement);To address functional/ADL transfers PT goals addressed during session: Mobility/safety with mobility OT goals addressed during session: ADL's and self-care;Proper use of Adaptive equipment and DME;Strengthening/ROM       AM-PAC PT "6 Clicks" Mobility  Outcome Measure Help needed turning from your back to your side while in a flat bed without using bedrails?: A Little Help needed moving from lying on your back to sitting on the side of a flat bed without using bedrails?: A Little Help needed moving to and from a bed to a chair (including a wheelchair)?: A Little Help needed standing up from a chair using your arms (e.g., wheelchair or bedside chair)?: A Little Help needed to walk in hospital room?: A Little Help needed climbing 3-5 steps with a railing? : A  Lot 6 Click Score: 17    End of Session Equipment Utilized During Treatment: Gait belt Activity Tolerance: Patient tolerated treatment well Patient left: in chair;with call bell/phone within reach;with chair alarm set Nurse Communication: Mobility status PT Visit Diagnosis: Difficulty in walking, not elsewhere classified (R26.2)    Time: 4098-1191 PT Time Calculation (min) (ACUTE ONLY): 19 min   Charges:   PT Evaluation $PT Eval Low Complexity: 1 Low   PT General Charges $$ ACUTE PT VISIT: 1 Visit       Paulino Door, DPT Physical Therapist Acute Rehabilitation Services Office: [phone]   Janan Halter [place] 03/29/2023, 2:50 PM

## 2023-03-29 NOTE — Progress Notes (Signed)
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  She is having progressive dyspnea and likely due to her pulmonary metastasis and was recently started on supplemental oxygen.  She was also recently started on treatment for urinary tract infection which grew out Enterococcus.  She reports that she is not experiencing any dysuria, fevers, chills, vomiting or diarrhea but has been progressively weaker with increasing fatigue and worsening cancer-related pain.  Her pain does improve with oxycodone at home.  But her sister reported that she was having worsening appetite.  Given her generalized weakness fatigue and pain she is brought to the ED and chest x-ray is notable for stable perihilar nodular opacities.  Blood work was done and showed a worsening AKI on CKD stage IV.  Blood cultures were obtained and she was given 1-1/2 L of LR, fentanyl and IV insulin.  She was transferred for [facility] for further evaluation and admission.  Medical oncology was consulted and are recommending transfusing 2 units of PRBCs.  Patient also has an impending fracture given the metastasis to her right tibia and was supposed to have surgical intervention to repair this.  She was transfused 2 units of PRBCs and the medical oncologist regarding discussed with orthopedic surgeon and recommending that she have a surgical repair of her right lower leg if possible.  Patient states that she feels a little bit better today.  Assessment and  Plan:  AKI superimposed on CKD IV Rhabdomyolysis in the setting of fall Metabolic acidosis -BUN/Cr Trend: Recent Labs  Lab 03/23/23 0725 03/26/23 0300 03/26/23 0324 03/27/23 1245 03/27/23 1750 03/28/23 0418 03/29/23 0410  BUN 90* 99* 113* 97* 97* 93* 84*  CREATININE 4.97* 5.19* 5.70* 5.53* 5.11* 5.18* 4.39*  -Patient is being hydrated with sodium bicarb and 150 mEq in sterile water at 100 mL/h for 20 hours which has not been discontinued -Medical oncology is typing and screening and transfusing 2 units of PRBCs -Patient's rhabdomyolysis is slowly improving and CK went from 3764 -> 2751 -> 803 and will repeat in the a.m. -Metabolic acidosis on admission which is now slowly improving.  Patient CO2 is now 19, anion gap of 13, chloride level is now 103 -Avoid Nephrotoxic Medications, Contrast Dyes, Hypotension and Dehydration to Ensure Adequate Renal Perfusion and will need to Renally Adjust Meds -Continue to Monitor and Trend Renal Function carefully and repeat CMP in the AM  -Continue with supportive care as renal function is improving and will give her a 500 mL bolus given  Endometrial cancer with metastases - Has received multiple different treatments under the care of Dr. Myna Hidalgo, now wants to focus on quality of life and asks that we consult Palliative Care this admission  -Continue with supportive care and pain control and continue with oxycodone 5 mg p.o. every 4 as needed for moderate pain as well as bowel regimen with senna docusate 1 tab p.o. nightly as needed for mild constipation -See below -Consult Palliative Care, notify Dr. Myna Hidalgo of admission   -Dr. Myna Hidalgo has evaluated and recommending transfusing 2 units of PRBCs and will  discuss with orthopedic surgery about her impending leg fracture from the metastasis; she received 20 mg of IV Lasix between her blood   Hx of DVT and PE  -Continue with enoxaparin 60 mg subcu every 24   Insulin-dependent DM with associated  hypoglycemia -A1c was 7.7% in August 2024  -Check CBGs and continue insulin detemir 10 units subcu twice daily as well as very sensitive NovoLog/scale insulin before meals and at bedtime -Continue monitor CBGs and glucose trends carefully and adjust as necessary; will continue with hypoglycemic protocols -CBG Trend: Recent Labs  Lab 03/29/23 0732 03/29/23 0745 03/29/23 0804 03/29/23 0825 03/29/23 0945 03/29/23 1214 03/29/23 1637  GLUCAP 56* 60* 63* 88 122* 144* 200*  -Glucose Trend: Recent Labs  Lab 03/23/23 0725 03/26/23 0300 03/26/23 0324 03/27/23 1245 03/27/23 1750 03/28/23 0418 03/29/23 0410  GLUCOSE 228* 203* 199* 317* 218* 173* 69*    Hypertension  -BP was low in ED and antihypertensives held on admission  -Continue to monitor blood pressures per protocol -Last blood pressure was 108/69   Depression and Anxiety -Continue with venlafaxine XR 37.5 mg p.o. daily with breakfast  Recent Enterococcus UTI -Pansensitive and was placed on amoxicillin which will be continued at 250 mg p.o. every 12 given her renal function -Most recent urinalysis done showed a turbid appearance with 50 glucose, large hemoglobin, large leukocytes, negative nitrites, 100 protein, no bacteria seen, present mucus, present budding yeast, greater than 50 RBCs per high-power field, 0-5 squamous epithelial cells, greater than 50 WBCs -Urine culture is negative and showing no growth given that she was on antibiotics prior to admission -Blood cultures x 2 obtained and showing no growth to date so far  Normocytic Anemia/Anemia of Chronic Disease -Hgb/Hct Trend:  Recent Labs  Lab 03/23/23 0725 03/26/23 0300 03/26/23 0324 03/27/23 1245 03/27/23 1253 03/28/23 0418 03/29/23 0410  HGB 7.6* 7.7* 7.5* 8.4* 8.8* 7.2* 8.9*  HCT 23.4* 24.9* 22.0* 26.4* 26.0* 23.2* 27.2*  MCV 93.2 90.5  --  89.2  --  92.4 86.1  -Iron/anemia panel was checked and showed an iron level of 44, UIBC 103, TIBC 147, saturation  ratios of 30% -Medical oncology was consulted and transfusion patient 2 units of PRBCs -Continue to monitor for signs and symptoms bleeding; no overt bleeding noted-repeat CBC in a.m.  Abdominal discomfort -Repeat CT scan of the abdomen pelvis without contrast and obtain a KUB   Dizziness -Check Orthostatics -Give a 500 mL bolus -Obtain PT vestibular evaluation given that she was dizzy just lying in the bed as well -Recent Head CT done and showed "No acute intracranial abnormality. No acute displaced fracture or traumatic listhesis of the cervical spine. Biapical ground-glass and consolidative peribronchovascular airspace opacities. 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 Jerl Santos and the PA and plan is for surgical intervention on Tuesday  Right toe ulcer and wound -Has a full-thickness ulcer and will check MRI of the foot -Wound care consulted and also check ABIs  Hypoalbuminemia -Patient's Albumin Trend: Recent Labs  Lab 03/09/23 1326 03/23/23 0725 03/26/23 0300 03/27/23 1245 03/29/23 0410  ALBUMIN 3.6 2.4* 2.1* 2.3* 2.2*  -Continue to Monitor and Trend  and repeat CMP in the AM  Generalized Weakness and Physical Deconditioning -Will obtain PT and OT to further evaluate and she is status post IV fluid hydration and will reassess in the morning for more fluid hydration   DVT prophylaxis: Enoxaparin 60 mg subcu every 24    Code Status: Limited: Do not attempt resuscitation (DNR) -DNR-LIMITED -Do Not Intubate/DNI  Family Communication:   Disposition Plan:  Level of care: Progressive Status is:  Inpatient Remains inpatient appropriate because: Needs surgical intervention and further clinical improvement   Consultants:  Medical Oncology Orthopedic Surgery  Procedures:  As delineated as above  Antimicrobials:  Anti-infectives (From admission, onward)    Start     Dose/Rate Route Frequency Ordered Stop   03/28/23 1000  amoxicillin (AMOXIL) capsule 250 mg  Status:  Discontinued        250 mg Oral Every 12 hours 03/28/23 0640 03/29/23 2440       Subjective: Seen and examined at bedside and was doing okay.  Had a toe ulcer.  Had some stomach pain today.  No nausea or vomiting.  Did feel very lightheaded and dizzy later on this afternoon.  No other concerns or complaints at this time.  Objective: Vitals:   03/29/23 0800 03/29/23 0805 03/29/23 1315 03/29/23 1754  BP: (!) 140/86  128/86 108/69  Pulse:   71   Resp:   18   Temp:   98.2 F (36.8 C)   TempSrc:   Oral   SpO2: (!) 89% 94% 100% 98%  Weight:      Height:        Intake/Output Summary (Last 24 hours) at 03/29/2023 1908 Last data filed at 03/29/2023 1815 Gross per 24 hour  Intake 349 ml  Output 901 ml  Net -552 ml   Filed Weights   03/27/23 1217 03/28/23 0155 03/29/23 0629  Weight: 65 kg 67.5 kg 70.9 kg   Examination: Physical Exam:  Constitutional: Overweight chronically ill-appearing Caucasian female appears slightly more comfortable Respiratory: Diminished to auscultation bilaterally, no wheezing, rales, rhonchi or crackles. Normal respiratory effort and patient is not tachypenic. No accessory muscle use.  Unlabored breathing Cardiovascular: RRR, no murmurs / rubs / gallops. S1 and S2 auscultated. No extremity edema.  Abdomen: Soft, little tender, tenderness secondary to body habitus. Bowel sounds positive.  GU: Deferred. Musculoskeletal: No clubbing / cyanosis of digits/nails. No joint deformity upper and lower extremities.  Skin: Has an ulcer on her right great toe with bone exposed Neurologic: CN 2-12  grossly intact with no focal deficits.  Has diminished sensation in her feet Psychiatric: Normal judgment and insight. Alert and oriented x 3. Normal mood and appropriate affect.   Data Reviewed: I have personally reviewed following labs and imaging studies  CBC: Recent Labs  Lab 03/23/23 0725 03/26/23 0300 03/26/23 0324 03/27/23 1245 03/27/23 1253 03/28/23 0418 03/29/23 0410  WBC 5.2 5.4  --  6.3  --  4.3 6.6  NEUTROABS 4.1  --   --  4.9  --   --  5.3  HGB 7.6* 7.7* 7.5* 8.4* 8.8* 7.2* 8.9*  HCT 23.4* 24.9* 22.0* 26.4* 26.0* 23.2* 27.2*  MCV 93.2 90.5  --  89.2  --  92.4 86.1  PLT 198 181  --  256  --  182 187   Basic Metabolic Panel: Recent Labs  Lab 03/26/23 0300 03/26/23 0324 03/27/23 1245 03/27/23 1253 03/27/23 1750 03/28/23 0418 03/29/23 0410  NA 131* 133* 133* 134* 133* 135 137  K  4.5 4.7 5.1 5.1 4.9 4.3 3.4*  CL 103 106 104  --  104 103 101  CO2 15*  --  14*  --  16* 19* 21*  GLUCOSE 203* 199* 317*  --  218* 173* 69*  BUN 99* 113* 97*  --  97* 93* 84*  CREATININE 5.19* 5.70* 5.53*  --  5.11* 5.18* 4.39*  CALCIUM 7.9*  --  8.1*  --  8.0* 7.9* 7.8*  MG  --   --   --   --   --   --  1.5*  PHOS  --   --   --   --   --   --  5.9*   GFR: Estimated Creatinine Clearance: 13.5 mL/min (A) (by C-G formula based on SCr of 4.39 mg/dL (H)). Liver Function Tests: Recent Labs  Lab 03/23/23 0725 03/26/23 0300 03/27/23 1245 03/29/23 0410  AST 13* 62* 74* 41  ALT 9 17 32 24  ALKPHOS 122 114 111 93  BILITOT 0.4 0.2* 0.2* 0.5  PROT 6.7 6.3* 6.8 5.8*  ALBUMIN 2.4* 2.1* 2.3* 2.2*   No results for input(s): "LIPASE", "AMYLASE" in the last 168 hours. No results for input(s): "AMMONIA" in the last 168 hours. Coagulation Profile: Recent Labs  Lab 03/27/23 1258  INR 1.2   Cardiac Enzymes: Recent Labs  Lab 03/26/23 0300 03/27/23 1330 03/29/23 0410  CKTOTAL 3,764* 2,751* 803*   BNP (last 3 results) No results for input(s): "PROBNP" in the last 8760  hours. HbA1C: No results for input(s): "HGBA1C" in the last 72 hours. CBG: Recent Labs  Lab 03/29/23 0804 03/29/23 0825 03/29/23 0945 03/29/23 1214 03/29/23 1637  GLUCAP 63* 88 122* 144* 200*   Lipid Profile: No results for input(s): "CHOL", "HDL", "LDLCALC", "TRIG", "CHOLHDL", "LDLDIRECT" in the last 72 hours. Thyroid Function Tests: No results for input(s): "TSH", "T4TOTAL", "FREET4", "T3FREE", "THYROIDAB" in the last 72 hours. Anemia Panel: Recent Labs    03/28/23 0736  TIBC 147*  IRON 44   Sepsis Labs: Recent Labs  Lab 03/23/23 0743 03/26/23 0324 03/27/23 1258 03/27/23 1443  LATICACIDVEN 1.6 0.7 1.8 1.4    Recent Results (from the past 240 hour(s))  SARS Coronavirus 2 by RT PCR (hospital order, performed in [facility] hospital lab) *cepheid single result test* Anterior Nasal Swab     Status: None   Collection Time: 03/23/23  7:26 AM   Specimen: Anterior Nasal Swab  Result Value Ref Range Status   SARS Coronavirus 2 by RT PCR NEGATIVE NEGATIVE Final    Comment: (NOTE) SARS-CoV-2 target nucleic acids are NOT DETECTED.  The SARS-CoV-2 RNA is generally detectable in upper and lower respiratory specimens during the acute phase of infection. The lowest concentration of SARS-CoV-2 viral copies this assay can detect is 250 copies / mL. A negative result does not preclude SARS-CoV-2 infection and should not be used as the sole basis for treatment or other patient management decisions.  A negative result may occur with improper specimen collection / handling, submission of specimen other than nasopharyngeal swab, presence of viral mutation(s) within the areas targeted by this assay, and inadequate number of viral copies (<250 copies / mL). A negative result must be combined with clinical observations, patient history, and epidemiological information.  Fact Sheet for Patients:   RoadLapTop.co.za  Fact Sheet for Healthcare  Providers: http://kim-miller.com/  This test is not yet approved or  cleared by the [place] FDA and has been authorized for detection and/or diagnosis of SARS-CoV-2  by FDA under an Emergency Use Authorization (EUA).  This EUA will remain in effect (meaning this test can be used) for the duration of the COVID-19 declaration under Section 564(b)(1) of the Act, 21 U.S.C. section 360bbb-3(b)(1), unless the authorization is terminated or revoked sooner.  Performed at [facility], [address] [place], [place] [address]   Blood culture (routine x 2)     Status: None   Collection Time: 03/23/23  7:40 AM   Specimen: BLOOD  Result Value Ref Range Status   Specimen Description   Final    BLOOD LEFT ANTECUBITAL Performed at [facility], [address] [place], [place] [address]    Special Requests   Final    BOTTLES DRAWN AEROBIC AND ANAEROBIC Blood Culture results may not be optimal due to an inadequate volume of blood received in culture bottles Performed at [facility], [address] [place], [place] [address]    Culture   Final    NO GROWTH 5 DAYS Performed at [facility], [address], [place], [place] [address]    Report Status 03/28/2023 FINAL  Final  Blood culture (routine x 2)     Status: None   Collection Time: 03/23/23  7:55 AM   Specimen: BLOOD  Result Value Ref Range Status   Specimen Description   Final    BLOOD RIGHT ANTECUBITAL Performed at [facility], [address] [place], [place] [address]    Special Requests   Final    BOTTLES DRAWN AEROBIC AND ANAEROBIC Blood Culture adequate volume Performed at [facility], [address] [place], [place] [address]    Culture   Final    NO GROWTH 5 DAYS Performed at [facility], [address] [place], [place] [address]    Report Status 03/28/2023 FINAL  Final  Urine Culture     Status: Abnormal   Collection Time:  03/23/23 10:45 AM   Specimen: Urine, Random  Result Value Ref Range Status   Specimen Description   Final    URINE, RANDOM Performed at [facility], [address] [place], [place] [address]    Special Requests   Final    NONE Reflexed from [id number] Performed at [facility], [address] [place], [place] [address]    Culture (A)  Final    50,000 COLONIES/mL ENTEROCOCCUS FAECALIS 50,000 COLONIES/mL DIPHTHEROIDS(CORYNEBACTERIUM SPECIES) Standardized susceptibility testing for this organism is not available. Performed at [facility], [address], [place], [place] [address]    Report Status 03/25/2023 FINAL  Final   Organism ID, Bacteria ENTEROCOCCUS FAECALIS (A)  Final      Susceptibility   Enterococcus faecalis - MIC*    AMPICILLIN <=2 SENSITIVE Sensitive     NITROFURANTOIN <=16 SENSITIVE Sensitive     VANCOMYCIN 2 SENSITIVE Sensitive     * 50,000 COLONIES/mL ENTEROCOCCUS FAECALIS  Blood Culture (routine x 2)     Status: None (Preliminary result)   Collection Time: 03/27/23 12:44 PM   Specimen: BLOOD  Result Value Ref Range Status   Specimen Description   Final    BLOOD RIGHT ANTECUBITAL Performed at [facility], [address] [place], [place] [address]    Special Requests   Final    BOTTLES DRAWN AEROBIC AND ANAEROBIC Blood Culture adequate volume Performed at [facility],  [address] [place], [place] [address]    Culture   Final    NO GROWTH 2 DAYS Performed at [facility], [address], [place], [place] [address]    Report Status PENDING  Incomplete  Blood Culture (routine x 2)     Status: None (Preliminary result)   Collection Time: 03/27/23  1:17 PM   Specimen: BLOOD  Result Value Ref Range Status   Specimen Description   Final    BLOOD LEFT ANTECUBITAL Performed at [facility], [address] [place], [place] [address]    Special Requests   Final    BOTTLES DRAWN AEROBIC ONLY  Blood Culture adequate volume Performed at [facility], [address] [place], [place] [address]    Culture   Final    NO GROWTH 2 DAYS Performed at [facility], [address], [place], [place] [address]    Report Status PENDING  Incomplete  Urine Culture     Status: None   Collection Time: 03/27/23  6:18 PM   Specimen: Urine, Random  Result Value Ref Range Status   Specimen Description   Final    URINE, RANDOM Performed at [facility], [address], [place], [place] [address]    Special Requests   Final    NONE Reflexed from [id number] Performed at [facility], [address] [place], [place] [address]    Culture   Final    NO GROWTH Performed at [facility], [address] [place], [place] [address]    Report Status 03/28/2023 FINAL  Final    Radiology Studies: DG CHEST PORT 1 VIEW  Result Date: 03/29/2023 CLINICAL DATA:  Shortness of breath EXAM: PORTABLE CHEST 1 VIEW COMPARISON:  CXR 03/27/23 FINDINGS: Right-sided needle access chest port in place. Pleural effusion. No pneumothorax. Unchanged cardiac and mediastinal contours. Unchanged airspace opacities in the right-greater-than-left perihilar region. No new focal airspace opacity. No radiographically apparent displaced rib fracture. Visualized upper abdomen is unremarkable. IMPRESSION: Unchanged airspace opacities in the right-greater-than-left perihilar region. No new focal airspace opacity. Electronically Signed   By: Lorenza [place] M.D.   On: 03/29/2023 12:33    Scheduled Meds:  Chlorhexidine Gluconate Cloth  6 each Topical Daily   enoxaparin  60 mg Subcutaneous Q24H   gabapentin  300 mg Oral QHS   glucose  1 tablet Oral Once   insulin aspart  0-5 Units Subcutaneous QHS   insulin aspart  0-6 Units Subcutaneous TID WC   insulin detemir  10 Units Subcutaneous BID   oxybutynin  10 mg Oral Daily   potassium chloride  40 mEq Oral BID   sodium chloride flush   10-40 mL Intracatheter Q12H   sodium chloride flush  3 mL Intravenous Q12H   venlafaxine XR  37.5 mg Oral Q breakfast   Continuous Infusions:   LOS: 2 days   Marguerita Merles, DO [facility] Available via Epic secure chat 7am-7pm After these hours, please refer to coverage provider listed on [email or web address] 03/29/2023, 7:08 PM

## 2023-03-29 NOTE — Consult Note (Addendum)
WOC Nurse Consult Note: Reason for Consult: Consult requested for right great toe.  Pt states she developed a wound to this site a few weeks ago.  Right anterior great toe with erythremia surrounding a full thickness wound; .2X.2X.1cm, bone visible and palpable when probed with a swab.  No odor, drainage, or fluctuance.  Pt had an ortho consult performed on 8/30 which indicated she has a mass in her right tibia which will require surgical intervention; possibly scheduled for next week.    Secure chat message sent to the primary team as follows; "This patient is scheduled for surgery with ortho for a mass on her tibia.  She has an atypical wound on her right great toe with exposed bone and I think she could benefit from a MRI to the right foot so determine if she needs surgery to this location also.  Thank-you for your consideration."  Topical treatment orders provided for bedside nurses to perform as follows to provide antimicrobial benefits: Cut piece of Aquacel and apply to right great toe wound Q day, then cover with foam dressing.  Change foam dressing Q 3 days or PRN soiling.  Please re-consult if further assistance is needed.  Thank-you,  Cammie Mcgee MSN, RN, CWOCN, [place], CNS [phone]

## 2023-03-29 NOTE — Evaluation (Signed)
Occupational Therapy Evaluation Patient Details Name: Jaime Gomez MRN: [id number] DOB: 1965/07/27 Today's Date: 03/29/2023   History of Present Illness 57 year old Caucasian female with past medical history significant for but not limited to hypertension, insulin-dependent diabetes mellitus type 2, depression, malignant hydronephrosis with right ureteral stent, CKD stage IV, endometrial cancer with metastasis, history of PE and DVT on anticoagulants enoxaparin as well as other comorbidities who presented with progressive generalized weakness, fatigue and pain.  She is having progressive dyspnea and likely due to her pulmonary metastasis and was recently started on supplemental oxygen.   Clinical Impression   Pt admitted with the above diagnosis. Pt currently with functional limitations due to the deficits listed below (see OT Problem List). Prior to admit, pt reports that she was Mod I for all BADL tasks and functional mobility utilizing a RW. Pt lives with her sister.  Pt will benefit from acute skilled OT to increase their safety and independence with ADL and functional mobility for ADL to facilitate discharge. Recommend pt discharge home with Sister, receiving assistance as needed. Recommend HHOT services to increase functional performance during ADL tasks and increase activity tolerance and endurance. OT will continue to follow patient acutely.        If plan is discharge home, recommend the following: A little help with walking and/or transfers;A little help with bathing/dressing/bathroom;Assist for transportation    Functional Status Assessment  Patient has had a recent decline in their functional status and demonstrates the ability to make significant improvements in function in a reasonable and predictable amount of time.  Equipment Recommendations  None recommended by OT       Precautions / Restrictions Precautions Precautions: Fall;Other (comment) Precaution Comments: She has a  left tibia mestastic lesion given her outpatient workup and she was to have surgical intervention for repair of this and now she was post to have surgical repair on the impending fracture on 03/27/2023 but was hospitalized.  Awaiting Ortho input but pt reports WBAT with walker prior to this admission Restrictions Weight Bearing Restrictions: No      Mobility Bed Mobility Overal bed mobility: Needs Assistance Bed Mobility: Supine to Sit     Supine to sit: Supervision, HOB elevated     General bed mobility comments: VC to initiate task. No physical assist needed.    Transfers Overall transfer level: Needs assistance Equipment used: Rolling walker (2 wheels) Transfers: Sit to/from Stand, Bed to chair/wheelchair/BSC Sit to Stand: Supervision     Step pivot transfers: Contact guard assist     General transfer comment: pt with reports of mild dizziness once standing. Reported feeling cold. SpO2 97% on 2L [place] after transfer.      Balance Overall balance assessment: History of Falls     ADL either performed or assessed with clinical judgement   ADL     General ADL Comments: Pt required SBA-Min A for BADL tasks at this time.     Vision Baseline Vision/History: 1 Wears glasses Ability to See in Adequate Light: 0 Adequate Patient Visual Report: No change from baseline Vision Assessment?: No apparent visual deficits     Perception Perception: Within Functional Limits       Praxis Praxis: WFL       Pertinent Vitals/Pain Pain Assessment Pain Assessment: 0-10 Pain Score: 5  Pain Location: Left rib Pain Descriptors / Indicators: Aching Pain Intervention(s): Patient requesting pain meds-RN notified, RN gave pain meds during session, Monitored during session     Extremity/Trunk Assessment Upper Extremity Assessment  Upper Extremity Assessment: Generalized weakness   Lower Extremity Assessment Lower Extremity Assessment: Defer to PT evaluation       Communication  Communication Communication: No apparent difficulties   Cognition Arousal: Alert Behavior During Therapy: WFL for tasks assessed/performed Overall Cognitive Status: Within Functional Limits for tasks assessed                   Home Living Family/patient expects to be discharged to:: Private residence Living Arrangements: Other relatives Available Help at Discharge: Family;Available PRN/intermittently (Sitter mon-fri 8-3 on weekdays) Type of Home: House Home Access: Level entry     Home Layout: Two level Alternate Level Stairs-Number of Steps: flight Alternate Level Stairs-Rails: Right (Has a stair chair lift) Bathroom Shower/Tub: Producer, television/film/video: Standard     Home Equipment: Agricultural consultant (2 wheels);Wheelchair - manual;BSC/3in1;Hospital bed;Grab bars - tub/shower;Grab bars - toilet;Shower seat;Other (comment)   Additional Comments: home oxygen 2L      Prior Functioning/Environment Prior Level of Function : Independent/Modified Independent             OT Problem List: Impaired balance (sitting and/or standing);Decreased activity tolerance      OT Treatment/Interventions: Self-care/ADL training;Therapeutic exercise;Therapeutic activities;Energy conservation;Patient/family education;DME and/or AE instruction;Balance training    OT Goals(Current goals can be found in the care plan section) Acute Rehab OT Goals Patient Stated Goal: to get stronger OT Goal Formulation: Patient unable to participate in goal setting Time For Goal Achievement: 04/12/23 Potential to Achieve Goals: Good  OT Frequency: Min 1X/week    Co-evaluation PT/OT/SLP Co-Evaluation/Treatment: Yes Reason for Co-Treatment: Complexity of the patient's impairments (multi-system involvement);To address functional/ADL transfers PT goals addressed during session: Mobility/safety with mobility OT goals addressed during session: ADL's and self-care;Proper use of Adaptive equipment and  DME;Strengthening/ROM      AM-PAC OT "6 Clicks" Daily Activity     Outcome Measure Help from another person eating meals?: None Help from another person taking care of personal grooming?: A Little Help from another person toileting, which includes using toliet, bedpan, or urinal?: A Little Help from another person bathing (including washing, rinsing, drying)?: A Little Help from another person to put on and taking off regular upper body clothing?: A Little Help from another person to put on and taking off regular lower body clothing?: A Little 6 Click Score: 19   End of Session Equipment Utilized During Treatment: Rolling walker (2 wheels);Oxygen Nurse Communication: Mobility status;Patient requests pain meds  Activity Tolerance: Patient tolerated treatment well Patient left: in chair;with call bell/phone within reach;with chair alarm set  OT Visit Diagnosis: Unsteadiness on feet (R26.81);Muscle weakness (generalized) (M62.81)                Time: 4098-1191 OT Time Calculation (min): 20 min Charges:  OT General Charges $OT Visit: 1 Visit OT Evaluation $OT Eval Moderate Complexity: 1 Mod  [employer], OTR/L,CBIS  Supplemental OT - [facility] and [facility] Secure Chat Preferred    Lateya Dauria, Charisse March 03/29/2023, 2:29 PM

## 2023-03-30 ENCOUNTER — Inpatient Hospital Stay (HOSPITAL_COMMUNITY): Payer: 59

## 2023-03-30 DIAGNOSIS — L97519 Non-pressure chronic ulcer of other part of right foot with unspecified severity: Secondary | ICD-10-CM

## 2023-03-30 DIAGNOSIS — R202 Paresthesia of skin: Secondary | ICD-10-CM | POA: Diagnosis not present

## 2023-03-30 DIAGNOSIS — N179 Acute kidney failure, unspecified: Secondary | ICD-10-CM | POA: Diagnosis not present

## 2023-03-30 DIAGNOSIS — Z515 Encounter for palliative care: Secondary | ICD-10-CM | POA: Diagnosis not present

## 2023-03-30 DIAGNOSIS — N171 Acute kidney failure with acute cortical necrosis: Secondary | ICD-10-CM

## 2023-03-30 DIAGNOSIS — J9611 Chronic respiratory failure with hypoxia: Secondary | ICD-10-CM | POA: Diagnosis not present

## 2023-03-30 DIAGNOSIS — C7951 Secondary malignant neoplasm of bone: Secondary | ICD-10-CM | POA: Diagnosis not present

## 2023-03-30 DIAGNOSIS — N184 Chronic kidney disease, stage 4 (severe): Secondary | ICD-10-CM | POA: Diagnosis not present

## 2023-03-30 DIAGNOSIS — C7801 Secondary malignant neoplasm of right lung: Secondary | ICD-10-CM | POA: Diagnosis not present

## 2023-03-30 DIAGNOSIS — C541 Malignant neoplasm of endometrium: Secondary | ICD-10-CM | POA: Diagnosis not present

## 2023-03-30 DIAGNOSIS — F321 Major depressive disorder, single episode, moderate: Secondary | ICD-10-CM | POA: Diagnosis not present

## 2023-03-30 LAB — CBC WITH DIFFERENTIAL/PLATELET
Abs Immature Granulocytes: 0.13 10*3/uL — ABNORMAL HIGH (ref 0.00–0.07)
Basophils Absolute: 0 10*3/uL (ref 0.0–0.1)
Basophils Relative: 0 %
Eosinophils Absolute: 0.1 10*3/uL (ref 0.0–0.5)
Eosinophils Relative: 2 %
HCT: 31.3 % — ABNORMAL LOW (ref 36.0–46.0)
Hemoglobin: 9.7 g/dL — ABNORMAL LOW (ref 12.0–15.0)
Immature Granulocytes: 2 %
Lymphocytes Relative: 7 %
Lymphs Abs: 0.4 10*3/uL — ABNORMAL LOW (ref 0.7–4.0)
MCH: 27.9 pg (ref 26.0–34.0)
MCHC: 31 g/dL (ref 30.0–36.0)
MCV: 89.9 fL (ref 80.0–100.0)
Monocytes Absolute: 0.5 10*3/uL (ref 0.1–1.0)
Monocytes Relative: 8 %
Neutro Abs: 4.9 10*3/uL (ref 1.7–7.7)
Neutrophils Relative %: 81 %
Platelets: 195 10*3/uL (ref 150–400)
RBC: 3.48 MIL/uL — ABNORMAL LOW (ref 3.87–5.11)
RDW: 19.5 % — ABNORMAL HIGH (ref 11.5–15.5)
WBC: 6.1 10*3/uL (ref 4.0–10.5)
nRBC: 0 % (ref 0.0–0.2)

## 2023-03-30 LAB — COMPREHENSIVE METABOLIC PANEL
ALT: 24 U/L (ref 0–44)
AST: 36 U/L (ref 15–41)
Albumin: 2.3 g/dL — ABNORMAL LOW (ref 3.5–5.0)
Alkaline Phosphatase: 111 U/L (ref 38–126)
Anion gap: 14 (ref 5–15)
BUN: 77 mg/dL — ABNORMAL HIGH (ref 6–20)
CO2: 22 mmol/L (ref 22–32)
Calcium: 7.9 mg/dL — ABNORMAL LOW (ref 8.9–10.3)
Chloride: 100 mmol/L (ref 98–111)
Creatinine, Ser: 4.23 mg/dL — ABNORMAL HIGH (ref 0.44–1.00)
GFR, Estimated: 12 mL/min — ABNORMAL LOW (ref >60)
Glucose, Bld: 64 mg/dL — ABNORMAL LOW (ref 70–99)
Potassium: 5 mmol/L (ref 3.5–5.1)
Sodium: 136 mmol/L (ref 135–145)
Total Bilirubin: 0.5 mg/dL (ref 0.3–1.2)
Total Protein: 6.3 g/dL — ABNORMAL LOW (ref 6.5–8.1)

## 2023-03-30 LAB — PHOSPHORUS: Phosphorus: 5 mg/dL — ABNORMAL HIGH (ref 2.5–4.6)

## 2023-03-30 LAB — GLUCOSE, CAPILLARY
Glucose-Capillary: 50 mg/dL — ABNORMAL LOW (ref 70–99)
Glucose-Capillary: 53 mg/dL — ABNORMAL LOW (ref 70–99)
Glucose-Capillary: 57 mg/dL — ABNORMAL LOW (ref 70–99)
Glucose-Capillary: 58 mg/dL — ABNORMAL LOW (ref 70–99)
Glucose-Capillary: 72 mg/dL (ref 70–99)
Glucose-Capillary: 93 mg/dL (ref 70–99)
Glucose-Capillary: 150 mg/dL — ABNORMAL HIGH (ref 70–99)
Glucose-Capillary: 158 mg/dL — ABNORMAL HIGH (ref 70–99)
Glucose-Capillary: 163 mg/dL — ABNORMAL HIGH (ref 70–99)

## 2023-03-30 LAB — MAGNESIUM: Magnesium: 2 mg/dL (ref 1.7–2.4)

## 2023-03-30 LAB — CK: Total CK: 416 U/L — ABNORMAL HIGH (ref 38–234)

## 2023-03-30 MED ORDER — IOHEXOL 9 MG/ML PO SOLN
ORAL | Status: AC
  Filled 2023-03-30: qty 1000

## 2023-03-30 MED ORDER — GLUCOSE 40 % PO GEL
2.0000 | ORAL | Status: AC
  Administered 2023-03-30: 62 g via ORAL
  Filled 2023-03-30: qty 2

## 2023-03-30 MED ORDER — SENNA 8.6 MG PO TABS
1.0000 | ORAL_TABLET | Freq: Every day | ORAL | Status: DC
  Administered 2023-03-31 – 2023-04-02 (×3): 8.6 mg via ORAL
  Filled 2023-03-30 (×4): qty 1

## 2023-03-30 MED ORDER — HEPARIN (PORCINE) 25000 UT/250ML-% IV SOLN
1550.0000 [IU]/h | INTRAVENOUS | Status: DC
  Administered 2023-03-31: 1300 [IU]/h via INTRAVENOUS
  Administered 2023-03-31: 1200 [IU]/h via INTRAVENOUS
  Administered 2023-04-01 – 2023-04-03 (×3): 1400 [IU]/h via INTRAVENOUS
  Filled 2023-03-30 (×5): qty 250

## 2023-03-30 MED ORDER — HYDROMORPHONE HCL 1 MG/ML IJ SOLN
1.0000 mg | INTRAMUSCULAR | Status: DC | PRN
  Filled 2023-03-30: qty 1

## 2023-03-30 MED ORDER — MORPHINE (PF) INJECTION FOR INHALATION 10 MG/ML: 10.0000 mg | RESPIRATORY_TRACT | Status: DC | PRN

## 2023-03-30 MED ORDER — IOHEXOL 9 MG/ML PO SOLN
500.0000 mL | ORAL | Status: AC
  Administered 2023-03-30 (×2): 500 mL via ORAL

## 2023-03-30 MED ORDER — HYDROMORPHONE HCL 1 MG/ML IJ SOLN
0.5000 mg | INTRAMUSCULAR | Status: DC | PRN
  Administered 2023-03-30 (×3): 0.5 mg via INTRAVENOUS
  Filled 2023-03-30 (×3): qty 0.5

## 2023-03-30 MED ORDER — INSULIN DETEMIR 100 UNIT/ML ~~LOC~~ SOLN
5.0000 [IU] | Freq: Two times a day (BID) | SUBCUTANEOUS | Status: DC
  Administered 2023-03-30 – 2023-04-02 (×7): 5 [IU] via SUBCUTANEOUS
  Filled 2023-03-30 (×9): qty 0.05

## 2023-03-30 MED ORDER — FENTANYL CITRATE PF 50 MCG/ML IJ SOSY
12.5000 ug | PREFILLED_SYRINGE | INTRAMUSCULAR | Status: DC | PRN
  Administered 2023-03-30: 25 ug via INTRAVENOUS
  Filled 2023-03-30: qty 1

## 2023-03-30 MED ORDER — CYCLOBENZAPRINE HCL 5 MG PO TABS: 5.0000 mg | ORAL_TABLET | Freq: Three times a day (TID) | ORAL | Status: DC | PRN

## 2023-03-30 NOTE — Progress Notes (Signed)
ABI's have been completed. Preliminary results can be found in CV Proc through chart review.   03/30/23 8:56 AM Olen Cordial RVT

## 2023-03-30 NOTE — Progress Notes (Signed)
[name] is a little bit "cranky" this morning.  Not sure as to why she is.  She seems to be doing fairly well.  Renal function continues to improve.  She is that she walked a little bit yesterday.  Patient still having quite a bit of pain.  This is mostly in the abdominal area.  I spoke to Orthopedic Surgery yesterday.  They still plan on doing surgery on her on Tuesday.  I think we will going to have to switch over to heparin over the weekend in preparation for surgery.  I have that she probably is going to have a spinal anesthesia which will be safer for her.  Her BUN 77 creatinine 4.23.  This continues to improve.  Calcium 7.9 with an albumin of 2.3.  Her white cell count 6.1.  Hemoglobin 9.7.  Platelet count 195,000.  She has had a little bit of a better appetite.  There is no vomiting.  She may have little bit of nausea.  She is having no diarrhea.  Her vital signs are temperature 98.4.  Pulse 91.  Blood pressure 145/98.  Her head and neck exam shows no ocular or oral lesions.  Her oral mucosa is quite dry.  Lungs are clear bilaterally.  Cardiac exam regular rate and rhythm.  There are no murmurs.  Abdomen is soft.  There is no fluid wave.  There is no guarding or rebound tenderness.  She may have a little bit of tenderness to palpation in the periumbilical region.  Extremity shows no clubbing, cyanosis or edema.  There is some tenderness over in the right lower leg.  Neurological exam shows no focal deficits.  [name] is improving with respect to her renal function.  I am happy about this part.  Again, she is going to have surgery to repair the right tibia on Tuesday.  We will get her onto heparin over the weekend.  Hopefully, should be able to eat a little bit more.  I certainly think that nutritional support will help her with respect to surgery.  I know that she is getting wonderful care from everybody on [address]  I do appreciate all of their compassion.  [name], MD  Hebrews 12:12

## 2023-03-30 NOTE — Progress Notes (Signed)
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  She is having progressive dyspnea and likely due to her pulmonary metastasis and was recently started on supplemental oxygen.  She was also recently started on treatment for urinary tract infection which grew out Enterococcus.  She reports that she is not experiencing any dysuria, fevers, chills, vomiting or diarrhea but has been progressively weaker with increasing fatigue and worsening cancer-related pain.  Her pain does improve with oxycodone at home.  But her sister reported that she was having worsening appetite.  Given her generalized weakness fatigue and pain she is brought to the ED and chest x-ray is notable for stable perihilar nodular opacities.  Blood work was done and showed a worsening AKI on CKD stage IV.  Blood cultures were obtained and she was given 1-1/2 L of LR, fentanyl and IV insulin.  She was transferred for [facility] for further evaluation and admission.  Medical oncology was consulted and are recommending transfusing 2 units of PRBCs.  Patient also has an impending fracture given the metastasis to her right tibia and was supposed to have surgical intervention to repair this.  She was transfused 2 units of PRBCs and the medical oncologist regarding discussed with orthopedic surgeon and recommending that she have a surgical repair of her right lower leg if possible and this is being planned for Tuesday.  She has a new noted ulcer on her toes  show we obtained ABIs and MRI of her foot and have called vascular surgeon.  She continued to have some abdominal pain so CT scan of the abdomen was repeated and palliative is adjusting her medications.  Assessment and Plan:  AKI superimposed on CKD IV, slowly improving Rhabdomyolysis in the setting of fall Metabolic acidosis -BUN/Cr Trend: Recent Labs  Lab 03/26/23 0300 03/26/23 0324 03/27/23 1245 03/27/23 1750 03/28/23 0418 03/29/23 0410 03/30/23 0353  BUN 99* 113* 97* 97* 93* 84* 77*  CREATININE 5.19* 5.70* 5.53* 5.11* 5.18* 4.39* 4.23*  -Patient is being hydrated with sodium bicarb and 150 mEq in sterile water at 100 mL/h for 20 hours which has not been discontinued -Medical oncology is typing and screening and transfusing 2 units of PRBCs -Patient's rhabdomyolysis is slowly improving and CK went from 3764 -> 2751 -> 803 and is now 416 and will repeat in the a.m. -Metabolic acidosis on admission which is now slowly improving.  Patient CO2 is now 19, anion gap of 13, chloride level is now 103 -Avoid Nephrotoxic Medications, Contrast Dyes if possible, Hypotension and Dehydration to Ensure Adequate Renal Perfusion and will need to Renally Adjust Meds -Continue to Monitor and Trend Renal Function carefully and repeat CMP in the AM  -Continue with supportive care as renal function is improving very slowly and she will be changed to heparin drip  Endometrial Cancer with Metastases - Has received multiple different treatments under the care of Dr. Myna Hidalgo, now wants to focus on quality of life and asks that we consult Palliative Care this admission  -Continue with supportive care and pain control and continue with oxycodone 5  mg p.o. every 4 as needed for moderate pain as well as bowel regimen with senna docusate 1 tab p.o. nightly as needed for mild constipation -See below -Consult Palliative Care, notify Dr. Myna Hidalgo of admission   -Dr. Myna Hidalgo has evaluated and recommending transfusing  2 units of PRBCs and will discuss with orthopedic surgery about her impending leg fracture from the metastasis; she received 20 mg of IV Lasix between her blood -Appreciate medical oncology evaluation   Hx of DVT and PE  -Continued with enoxaparin 60 mg subcu every 24 for now but tonight we will change her to heparin drip in anticipation for surgical intervention on Tuesday   Insulin-dependent DM with associated hypoglycemia -A1c was 7.7% in August 2024  -Check CBGs and continue insulin detemir but reduced to 5 units subcu twice daily as well as very sensitive NovoLog/scale insulin before meals and at bedtime -Continue monitor CBGs and glucose trends carefully and adjust as necessary; will continue with hypoglycemic protocols -CBG Trend: Recent Labs  Lab 03/30/23 0758 03/30/23 0811 03/30/23 0821 03/30/23 0834 03/30/23 0902 03/30/23 1124 03/30/23 1633  GLUCAP 50* 58* 53* 72 93 150* 158*  -Glucose Trend: Recent Labs  Lab 03/26/23 0300 03/26/23 0324 03/27/23 1245 03/27/23 1750 03/28/23 0418 03/29/23 0410 03/30/23 0353  GLUCOSE 203* 199* 317* 218* 173* 69* 64*    Hypertension  -BP was low in ED and antihypertensives held on admission  -Continue to monitor blood pressures per protocol -Last blood pressure was 108/69   Depression and Anxiety -Continue with venlafaxine XR 37.5 mg p.o. daily with breakfast  Recent Enterococcus UTI -Pansensitive and was placed on amoxicillin which will be continued at 250 mg p.o. every 12 given her renal function -Most recent urinalysis done showed a turbid appearance with 50 glucose, large hemoglobin, large leukocytes, negative nitrites, 100 protein, no bacteria seen, present mucus, present budding yeast, greater than 50 RBCs per high-power field, 0-5 squamous epithelial cells, greater than 50 WBCs -Urine culture is negative and showing no growth given that she was on antibiotics prior to admission -Blood cultures x 2 obtained and showing no  growth to date so far at 3 Days  Normocytic Anemia/Anemia of Chronic Disease -Hgb/Hct Trend:  Recent Labs  Lab 03/26/23 0300 03/26/23 0324 03/27/23 1245 03/27/23 1253 03/28/23 0418 03/29/23 0410 03/30/23 0353  HGB 7.7* 7.5* 8.4* 8.8* 7.2* 8.9* 9.7*  HCT 24.9* 22.0* 26.4* 26.0* 23.2* 27.2* 31.3*  MCV 90.5  --  89.2  --  92.4 86.1 89.9  -Iron/anemia panel was checked and showed an iron level of 44, UIBC 103, TIBC 147, saturation ratios of 30% -Medical oncology was consulted and transfusion patient 2 units of PRBCs -Continue to monitor for signs and symptoms bleeding; no overt bleeding note -Repeat CBC in a.m.  Abdominal Discomfort and likely Visceral Pain from Mets -Repeat CT scan of the abdomen pelvis without contrast and obtain a KUB  -CT Scan repeatd and done and showed "No acute intra-abdominal or pelvic pathology.  Bilateral ureteral stents in similar position as the prior CT. Similar appearance of mild right hydronephrosis. Cholelithiasis. Mild sigmoid diverticulosis. No bowel obstruction. Normal appendix.  Aortic Atherosclerosis." -Palliative consulted and they feel that her pain is visceral from lung metastasis and from possible metastasis and they feel that ideally she would benefit from dexamethasone but given her plans for surgical intervention on Tuesday they do not want immunocompromise or and we cannot use NSAIDs or anti-inflammatories given her poor renal function so if initiated hydromorphones or 0.5  mg every 3 as needed and we will see how she does overnight and recommending considering a long-acting opioid.  They have also started her on Flexeril 5 mg 3 times daily as needed as well as a bowel regimen and also going to give her morphine 10 mg nebulized every 2 as needed for shortness of breath  Dizziness -Check Orthostatics in the AM; Also was hypoglycemic  -Given a 500 mL bolus yesterday -Obtain PT vestibular evaluation given that she was dizzy just lying in the bed as  well -Recent Head CT done and showed "No acute intracranial abnormality. No acute displaced fracture or traumatic listhesis of the cervical spine. Biapical ground-glass and consolidative peribronchovascular airspace opacities. 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 Jerl Santos and the PA and plan is for surgical intervention on Tuesday  Right toe ulcer and wound -Has a full-thickness ulcer and will check MRI of the foot -Wound care consulted and also check ABIs -ABI's done and showed "Resting right ankle-brachial index indicates noncompressible right lower extremity arteries. Unable to obtain TBI due to great toe open wound/bandages. Left: Resting left ankle-brachial index indicates noncompressible left lower extremity arteries. The left toe-brachial index is abnormal." -MRI Foot done and showed no evidence of foot ulcer on exam but did show "No evidence of soft tissue abscess or osteomyelitis in the right forefoot. Suspected subacute fracture of the 5th metatarsal neck. Recommend plain film and clinical correlation. Mild degenerative changes as described. No evidence of metastatic disease in the foot." -Discussed with Vascular Surgery Dr. Lenell Antu and he will see th patient in consultation in the AM  Hypoalbuminemia -Patient's Albumin Trend: Recent Labs  Lab 03/09/23 1326 03/23/23 0725 03/26/23 0300 03/27/23 1245 03/29/23 0410 03/30/23 0353  ALBUMIN 3.6 2.4* 2.1* 2.3* 2.2* 2.3*  -Continue to Monitor and Trend and repeat CMP in the AM  Generalized Weakness and Physical Deconditioning -Will  obtain PT and OT to further evaluate and she is status post IV fluid hydration and will reassess in the morning for more fluid hydration   DVT prophylaxis: On enoxaparin 60 mg subcu every 24 but will be changed to heparin drip    Code Status: Limited: Do not attempt resuscitation (DNR) -DNR-LIMITED -Do Not Intubate/DNI  Family Communication: No family currently at bedside but patient's aide was at the bedside  Disposition Plan:  Level of care: Progressive Status is: Inpatient Remains inpatient appropriate because: Needs further clinical improvement and clearance and will be undergoing surgical invention on Tuesday however will need further pain control   Consultants:  Medical Oncology Orthopedic Surgery Palliative Care Medicine Vascular Surgery  Procedures:  As delineated as above  Antimicrobials:  Anti-infectives (From admission, onward)    Start     Dose/Rate Route Frequency Ordered Stop   03/28/23 1000  amoxicillin (AMOXIL) capsule 250 mg  Status:  Discontinued        250 mg Oral Every 12 hours 03/28/23 0640 03/29/23 0619       Subjective: Seen and examined at bedside and was hypoglycemic again this morning so further insulin has been adjusted.  She felt okay and continue to have some  abdominal discomfort and pain.  No nausea or vomiting.  Mildly dizzy.  No other concerns or complaints at this time.  Objective: Vitals:   03/30/23 1254 03/30/23 1700 03/30/23 1716 03/30/23 1724  BP: (!) 133/100 82/95 103/65 92/67  Pulse:      Resp:      Temp:      TempSrc:      SpO2: 93%     Weight:      Height:        Intake/Output Summary (Last 24 hours) at 03/30/2023 1853 Last data filed at 03/30/2023 0900 Gross per 24 hour  Intake 720 ml  Output 700 ml  Net 20 ml   Filed Weights   03/28/23 0155 03/29/23 0629 03/30/23 0500  Weight: 67.5 kg 70.9 kg 70.2 kg   Examination: Physical Exam:  Constitutional: WN/WD overweight chronically ill-appearing Caucasian female who appears a  little uncomfortable Respiratory: Diminished to auscultation bilaterally, no wheezing, rales, rhonchi or crackles. Normal respiratory effort and patient is not tachypenic. No accessory muscle use. Unlabored breathing   Cardiovascular: RRR, no murmurs / rubs / gallops. S1 and S2 auscultated. No extremity edema.   Abdomen: Soft, tender to palpate r, distended secondary to body habitus bowel sounds positive.  GU: Deferred. Musculoskeletal: No clubbing / cyanosis of digits/nails. No joint deformity upper and lower extremities. Skin: Ulcer noted on right big toe Neurologic: CN 2-12 grossly intact with no focal deficits. Romberg sign and cerebellar reflexes not assessed.  Psychiatric: Normal judgment and insight. Alert and oriented x 3.  Mildly anxious mood and appropriate affect.   Data Reviewed: I have personally reviewed following labs and imaging studies  CBC: Recent Labs  Lab 03/26/23 0300 03/26/23 0324 03/27/23 1245 03/27/23 1253 03/28/23 0418 03/29/23 0410 03/30/23 0353  WBC 5.4  --  6.3  --  4.3 6.6 6.1  NEUTROABS  --   --  4.9  --   --  5.3 4.9  HGB 7.7*   < > 8.4* 8.8* 7.2* 8.9* 9.7*  HCT 24.9*   < > 26.4* 26.0* 23.2* 27.2* 31.3*  MCV 90.5  --  89.2  --  92.4 86.1 89.9  PLT 181  --  256  --  182 187 195   < > = values in this interval not displayed.   Basic Metabolic Panel: Recent Labs  Lab 03/27/23 1245 03/27/23 1253 03/27/23 1750 03/28/23 0418 03/29/23 0410 03/30/23 0353  NA 133* 134* 133* 135 137 136  K 5.1 5.1 4.9 4.3 3.4* 5.0  CL 104  --  104 103 101 100  CO2 14*  --  16* 19* 21* 22  GLUCOSE 317*  --  218* 173* 69* 64*  BUN 97*  --  97* 93* 84* 77*  CREATININE 5.53*  --  5.11* 5.18* 4.39* 4.23*  CALCIUM 8.1*  --  8.0* 7.9* 7.8* 7.9*  MG  --   --   --   --  1.5* 2.0  PHOS  --   --   --   --  5.9* 5.0*   GFR: Estimated Creatinine Clearance: 13.9 mL/min (A) (by C-G formula based on SCr of 4.23 mg/dL (H)). Liver Function Tests: Recent Labs  Lab  03/26/23 0300 03/27/23 1245 03/29/23 0410 03/30/23 0353  AST 62* 74* 41 36  ALT 17 32 24 24  ALKPHOS 114 111 93 111  BILITOT 0.2* 0.2* 0.5 0.5  PROT 6.3* 6.8 5.8* 6.3*  ALBUMIN 2.1* 2.3* 2.2* 2.3*   No results for  input(s): "LIPASE", "AMYLASE" in the last 168 hours. No results for input(s): "AMMONIA" in the last 168 hours. Coagulation Profile: Recent Labs  Lab 03/27/23 1258  INR 1.2   Cardiac Enzymes: Recent Labs  Lab 03/26/23 0300 03/27/23 1330 03/29/23 0410 03/30/23 0353  CKTOTAL 3,764* 2,751* 803* 416*   BNP (last 3 results) No results for input(s): "PROBNP" in the last 8760 hours. HbA1C: No results for input(s): "HGBA1C" in the last 72 hours. CBG: Recent Labs  Lab 03/30/23 0821 03/30/23 0834 03/30/23 0902 03/30/23 1124 03/30/23 1633  GLUCAP 53* 72 93 150* 158*   Lipid Profile: No results for input(s): "CHOL", "HDL", "LDLCALC", "TRIG", "CHOLHDL", "LDLDIRECT" in the last 72 hours. Thyroid Function Tests: No results for input(s): "TSH", "T4TOTAL", "FREET4", "T3FREE", "THYROIDAB" in the last 72 hours. Anemia Panel: Recent Labs    03/28/23 0736  TIBC 147*  IRON 44   Sepsis Labs: Recent Labs  Lab 03/26/23 0324 03/27/23 1258 03/27/23 1443  LATICACIDVEN 0.7 1.8 1.4    Recent Results (from the past 240 hour(s))  SARS Coronavirus 2 by RT PCR (hospital order, performed in [facility] hospital lab) *cepheid single result test* Anterior Nasal Swab     Status: None   Collection Time: 03/23/23  7:26 AM   Specimen: Anterior Nasal Swab  Result Value Ref Range Status   SARS Coronavirus 2 by RT PCR NEGATIVE NEGATIVE Final    Comment: (NOTE) SARS-CoV-2 target nucleic acids are NOT DETECTED.  The SARS-CoV-2 RNA is generally detectable in upper and lower respiratory specimens during the acute phase of infection. The lowest concentration of SARS-CoV-2 viral copies this assay can detect is 250 copies / mL. A negative result does not preclude SARS-CoV-2  infection and should not be used as the sole basis for treatment or other patient management decisions.  A negative result may occur with improper specimen collection / handling, submission of specimen other than nasopharyngeal swab, presence of viral mutation(s) within the areas targeted by this assay, and inadequate number of viral copies (<250 copies / mL). A negative result must be combined with clinical observations, patient history, and epidemiological information.  Fact Sheet for Patients:   RoadLapTop.co.za  Fact Sheet for Healthcare Providers: http://kim-miller.com/  This test is not yet approved or  cleared by the [place] FDA and has been authorized for detection and/or diagnosis of SARS-CoV-2 by FDA under an Emergency Use Authorization (EUA).  This EUA will remain in effect (meaning this test can be used) for the duration of the COVID-19 declaration under Section 564(b)(1) of the Act, 21 U.S.C. section 360bbb-3(b)(1), unless the authorization is terminated or revoked sooner.  Performed at [facility], [address] [place], [place] [address]   Blood culture (routine x 2)     Status: None   Collection Time: 03/23/23  7:40 AM   Specimen: BLOOD  Result Value Ref Range Status   Specimen Description   Final    BLOOD LEFT ANTECUBITAL Performed at [facility], [address] [place], [place] [address]    Special Requests   Final    BOTTLES DRAWN AEROBIC AND ANAEROBIC Blood Culture results may not be optimal due to an inadequate volume of blood received in culture bottles Performed at [facility], [address] [place], [place] [address]    Culture   Final    NO GROWTH 5 DAYS Performed at [facility], [address], [place], [place] [address]  Report Status 03/28/2023 FINAL  Final  Blood culture (routine x 2)     Status: None   Collection Time: 03/23/23  7:55 AM    Specimen: BLOOD  Result Value Ref Range Status   Specimen Description   Final    BLOOD RIGHT ANTECUBITAL Performed at [facility], [address] [place], [place] [address]    Special Requests   Final    BOTTLES DRAWN AEROBIC AND ANAEROBIC Blood Culture adequate volume Performed at [facility], [address] [place], [place] [address]    Culture   Final    NO GROWTH 5 DAYS Performed at [facility], [address], [place], [place] [address]    Report Status 03/28/2023 FINAL  Final  Urine Culture     Status: Abnormal   Collection Time: 03/23/23 10:45 AM   Specimen: Urine, Random  Result Value Ref Range Status   Specimen Description   Final    URINE, RANDOM Performed at [facility], [address] [place], [place] [address]    Special Requests   Final    NONE Reflexed from [id number] Performed at [facility], [address] [place], [place] [address]    Culture (A)  Final    50,000 COLONIES/mL ENTEROCOCCUS FAECALIS 50,000 COLONIES/mL DIPHTHEROIDS(CORYNEBACTERIUM SPECIES) Standardized susceptibility testing for this organism is not available. Performed at [facility], [address] [place], [place] [address]    Report Status 03/25/2023 FINAL  Final   Organism ID, Bacteria ENTEROCOCCUS FAECALIS (A)  Final      Susceptibility   Enterococcus faecalis - MIC*    AMPICILLIN <=2 SENSITIVE Sensitive     NITROFURANTOIN <=16 SENSITIVE Sensitive     VANCOMYCIN 2 SENSITIVE Sensitive     * 50,000 COLONIES/mL ENTEROCOCCUS FAECALIS  Blood Culture (routine x 2)     Status: None (Preliminary result)   Collection Time: 03/27/23 12:44 PM   Specimen: BLOOD  Result Value Ref Range Status   Specimen Description   Final    BLOOD RIGHT ANTECUBITAL Performed at [facility], [address] [place], [place] [address]    Special Requests   Final    BOTTLES DRAWN AEROBIC AND ANAEROBIC Blood Culture adequate  volume Performed at [facility], [address] [place], [place] [address]    Culture   Final    NO GROWTH 3 DAYS Performed at [facility], [address], [place], [place] [address]    Report Status PENDING  Incomplete  Blood Culture (routine x 2)     Status: None (Preliminary result)   Collection Time: 03/27/23  1:17 PM   Specimen: BLOOD  Result Value Ref Range Status   Specimen Description   Final    BLOOD LEFT ANTECUBITAL Performed at [facility], [address] [place], [place] [address]    Special Requests   Final    BOTTLES DRAWN AEROBIC ONLY Blood Culture adequate volume Performed at [facility], [address] [place], [place] [address]    Culture   Final    NO GROWTH 3 DAYS Performed at [facility], [address] [place], [place] [address]    Report Status PENDING  Incomplete  Urine Culture     Status: None   Collection Time: 03/27/23  6:18 PM   Specimen: Urine, Random  Result Value Ref  Range Status   Specimen Description   Final    URINE, RANDOM Performed at [facility], [address], [place], [place] [address]    Special Requests   Final    NONE Reflexed from [id number] Performed at [facility], [address] [place], [place] [address]    Culture   Final    NO GROWTH Performed at [facility], [address], [place], [place] [address]    Report Status 03/28/2023 FINAL  Final    Radiology Studies: CT ABDOMEN PELVIS WO CONTRAST  Result Date: 03/30/2023 CLINICAL DATA:  Abdominal pain.  Metastatic endometrial cancer. EXAM: CT ABDOMEN AND PELVIS WITHOUT CONTRAST TECHNIQUE: Multidetector CT imaging of the abdomen and pelvis was performed following the standard protocol without IV contrast. RADIATION DOSE REDUCTION: This exam was performed according to the departmental dose-optimization program which includes automated exposure control, adjustment of the mA and/or kV  according to patient size and/or use of iterative reconstruction technique. COMPARISON:  CT of the chest abdomen pelvis dated 03/26/2023. FINDINGS: Evaluation of this exam is limited in the absence of intravenous contrast. Lower chest: Partially visualized bilateral pulmonary masses and patchy hazy densities as seen on the prior CT. The tip of a central venous line noted at the cavoatrial junction. There is advanced 3 vessel coronary vascular calcification. No intra-abdominal free air or free fluid. Hepatobiliary: The liver is unremarkable. No biliary dilatation. Noncalcified gallstone. No pericholecystic fluid or evidence of acute cholecystitis by CT. Pancreas: Unremarkable. No pancreatic ductal dilatation or surrounding inflammatory changes. Spleen: No acute findings.  Upper pole scarring. Adrenals/Urinary Tract: The adrenal glands are unremarkable. Bilateral ureteral stents in similar position. Renal vascular calcifications noted bilaterally. Nonobstructing bilateral renal calculi may be present. There is similar appearance of mild right hydronephrosis. The urinary bladder is grossly unremarkable. Stomach/Bowel: Mild sigmoid diverticulosis without active inflammatory changes. There is no bowel obstruction or active inflammation. The appendix is normal. Vascular/Lymphatic: Mild aortoiliac atherosclerotic disease. An infrarenal IVC filter is noted. No portal venous gas. There is no adenopathy. Reproductive: Hysterectomy.  No adnexal masses. Other: Midline vertical anterior pelvic wall incisional scar. A ventral hernia repair mesh is noted. There is abutment of several loops of small bowel to the hernia repair mesh suggestive of adhesions. Musculoskeletal: Osteopenia with mild degenerative changes. No acute osseous pathology. IMPRESSION: 1. No acute intra-abdominal or pelvic pathology. 2. Bilateral ureteral stents in similar position as the prior CT. Similar appearance of mild right hydronephrosis. 3.  Cholelithiasis. 4. Mild sigmoid diverticulosis. No bowel obstruction. Normal appendix. 5.  Aortic Atherosclerosis (ICD10-I70.0). Electronically Signed   By: Elgie Collard M.D.   On: 03/30/2023 15:12   MR FOOT RIGHT WO CONTRAST  Result Date: 03/30/2023 CLINICAL DATA:  Soft tissue infection suspected, foot. Tibial metastasis. Reported toe ulcer. EXAM: MRI OF THE RIGHT FOREFOOT WITHOUT CONTRAST TECHNIQUE: Multiplanar, multisequence MR imaging of the right forefoot was performed. No intravenous contrast was administered. COMPARISON:  Limited correlation made with previous MRI of the right lower leg 02/09/2023 which does not include the forefoot. No comparison radiographs. FINDINGS: Bones/Joint/Cartilage The site of the reported toe ulcer is not indicated and is not obvious to me. There is heterogeneous fat saturation on the T2 weighted images through the toes. No suspicious osseous findings are identified within the toes on the T1 weighted or inversion recovery images. There appears to be a subacute fracture of the 5th metatarsal neck with associated marrow edema. No cortical destruction identified. No other  fractures are seen. There are degenerative changes at the 1st metatarsophalangeal joint. Mild midfoot degenerative changes are noted. No significant joint effusions. Ligaments Intact Lisfranc ligament. The collateral ligaments of the metatarsophalangeal joints appear intact. Muscles and Tendons Generalized muscular T2 hyperintensity, likely related to diabetes. No evidence of forefoot tendon tear or significant tenosynovitis. Soft tissues No obvious skin ulcer identified in the toes. There is no evidence of soft tissue abscess or metastatic disease. Mild nonspecific dorsal subcutaneous edema without focal fluid collection. IMPRESSION: 1. No evidence of soft tissue abscess or osteomyelitis in the right forefoot. 2. Suspected subacute fracture of the 5th metatarsal neck. Recommend plain film and clinical  correlation. 3. Mild degenerative changes as described. 4. No evidence of metastatic disease in the foot. Electronically Signed   By: Carey Bullocks M.D.   On: 03/30/2023 13:25   VAS [place] ABI WITH/WO TBI  Result Date: 03/30/2023  LOWER EXTREMITY DOPPLER STUDY Patient Name:  Kalanie Yoss  Date of Exam:   03/30/2023 Medical Rec #: [id number]     Accession #:    [id number] Date of Birth: Feb 29, 1966     Patient Gender: F Patient Age:   26 years Exam Location:  [facility] Procedure:      VAS [place] ABI WITH/WO TBI Referring Phys: Marguerita Merles --------------------------------------------------------------------------------  Indications: Parasthesia High Risk Factors: Diabetes.  Limitations: Today's exam was limited due to an open wound and bandages. Comparison Study: No prior studies. Performing Technologist: Olen Cordial RVT  Examination Guidelines: A complete evaluation includes at minimum, Doppler waveform signals and systolic blood pressure reading at the level of bilateral brachial, anterior tibial, and posterior tibial arteries, when vessel segments are accessible. Bilateral testing is considered an integral part of a complete examination. Photoelectric Plethysmograph (PPG) waveforms and toe systolic pressure readings are included as required and additional duplex testing as needed. Limited examinations for reoccurring indications may be performed as noted.  ABI Findings: +---------+------------------+-----+-----------+--------+ Right    Rt Pressure (mmHg)IndexWaveform   Comment  +---------+------------------+-----+-----------+--------+ Brachial 116                    triphasic           +---------+------------------+-----+-----------+--------+ PTA      252               2.15 multiphasic         +---------+------------------+-----+-----------+--------+ DP       254               2.17 multiphasic         +---------+------------------+-----+-----------+--------+ Great Toe                                   Wound    +---------+------------------+-----+-----------+--------+ +---------+------------------+-----+-----------+-------+ Left     Lt Pressure (mmHg)IndexWaveform   Comment +---------+------------------+-----+-----------+-------+ Brachial 117                    triphasic          +---------+------------------+-----+-----------+-------+ PTA      254               2.17 multiphasic        +---------+------------------+-----+-----------+-------+ DP       254               2.17 multiphasic        +---------+------------------+-----+-----------+-------+ [facility] Toe216  1.85                    +---------+------------------+-----+-----------+-------+ +-------+-----------+-----------+------------+------------+ ABI/TBIToday's ABIToday's TBIPrevious ABIPrevious TBI +-------+-----------+-----------+------------+------------+ Right  [place]                                             +-------+-----------+-----------+------------+------------+ Left   [place]         [place]                                  +-------+-----------+-----------+------------+------------+  Summary: Right: Resting right ankle-brachial index indicates noncompressible right lower extremity arteries. Unable to obtain TBI due to great toe open wound/bandages. Left: Resting left ankle-brachial index indicates noncompressible left lower extremity arteries. The left toe-brachial index is abnormal. *See table(s) above for measurements and observations.     Preliminary    DG Abd 1 View  Result Date: 03/29/2023 CLINICAL DATA:  Abdominal pain. EXAM: ABDOMEN - 1 VIEW COMPARISON:  CT 03/26/2023 FINDINGS: Bilateral ureteral stents in place. IVC filter in place. Prior abdominal hernia repair with tacks. No bowel dilatation or obstruction. Small volume of stool in the colon. IMPRESSION: Normal bowel gas pattern. Bilateral ureteral stents in place. Electronically Signed   By: Narda Gomez M.D.    On: 03/29/2023 21:54   DG CHEST PORT 1 VIEW  Result Date: 03/29/2023 CLINICAL DATA:  Shortness of breath EXAM: PORTABLE CHEST 1 VIEW COMPARISON:  CXR 03/27/23 FINDINGS: Right-sided needle access chest port in place. Pleural effusion. No pneumothorax. Unchanged cardiac and mediastinal contours. Unchanged airspace opacities in the right-greater-than-left perihilar region. No new focal airspace opacity. No radiographically apparent displaced rib fracture. Visualized upper abdomen is unremarkable. IMPRESSION: Unchanged airspace opacities in the right-greater-than-left perihilar region. No new focal airspace opacity. Electronically Signed   By: Lorenza Cambridge M.D.   On: 03/29/2023 12:33    Scheduled Meds:  Chlorhexidine Gluconate Cloth  6 each Topical Daily   gabapentin  300 mg Oral QHS   insulin aspart  0-5 Units Subcutaneous QHS   insulin aspart  0-6 Units Subcutaneous TID WC   insulin detemir  5 Units Subcutaneous BID   oxybutynin  10 mg Oral Daily   senna  1 tablet Oral QHS   sodium chloride flush  10-40 mL Intracatheter Q12H   sodium chloride flush  3 mL Intravenous Q12H   venlafaxine XR  37.5 mg Oral Q breakfast   Continuous Infusions:   LOS: 3 days   Marguerita Merles, DO [facility] Available via Epic secure chat 7am-7pm After these hours, please refer to coverage provider listed on [email or web address] 03/30/2023, 6:53 PM

## 2023-03-30 NOTE — Consult Note (Signed)
Consultation Note Date: 03/30/2023   Patient Name: Jaime Gomez  DOB: Dec 06, 1965  MRN: [id number]  Age / Sex: 57 y.o., female  PCP: Karie Georges, MD Referring Physician: Merlene Laughter, DO  Reason for Consultation:  metastatic, cancer, pt and her family requested Palliative consult to help determine next steps  HPI/Patient Profile: 57 y.o. 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  Plans for chemotherapy have been on hold due to hospitalizations and to await surgery. Admitted for acute on chronic renal injury and worsening dypnea, and anemia. Palliative medicine consulted for GOC.    Primary Decision Maker PATIENT with support of [name]  Discussion: Chart reviewed including labs, progress notes, imaging from this and previous encounters.  Noted previous mention by Dr. Myna Hidalgo of concerns re: patient's ability to tolerate chemotherapy.  On evaluation today patient reporting severe pain that starts in back and radiates around abdomen. She also has shortness of breath- notes nebulized morphine was ordered on previous admission.  I remained at bedside until patient's pain was better controlled.  Per [name]- [name] has been uncertain as to her choice of path for her care. She is considering a more comfort focused path but just isn't certain.  We made plan to focus on pain control today and have further discussion regarding goals of care. I'm sure they will appreciate any insights that Dr. Myna Hidalgo has to offer.     SUMMARY OF RECOMMENDATIONS --Symptom management- I am concerned her pain is visceral from lung mets and also from bone  mets --Ideally she would benefit from dexamethasone, but with the plan for surgery on Tuesday I don't want to immunocompromise her and she agrees; she cannot have other antiinflammatory like NSAID or toradol due to her poor renal function- although it is improving slowly --Recommend hydromorphone .5mg  q3 hr prn- will look at how she does over night and consider a long acting opioid --Flexeril 5mg  TID prn  --Senna 1 po at bedtime -- Morphine 10mg  nebulized q 2 hrs prn for SOB  Code Status/Advance Care Planning: DNR   Prognosis:   Unable to determine  Discharge Planning: To Be Determined  Primary Diagnoses: Present on Admission:  Acute renal failure superimposed on stage 4 chronic kidney disease (HCC)  Metabolic acidosis  Endometrial cancer, FIGO stage IVB (HCC)  Depression, major, single episode, moderate (HCC)  Chronic respiratory failure with hypoxia (HCC)  Rhabdomyolysis   Review of Systems  Physical Exam Vitals and nursing note reviewed.  Constitutional:      Appearance: She is ill-appearing and diaphoretic.  Cardiovascular:     Rate and Rhythm: Normal rate.  Pulmonary:     Effort: Pulmonary effort is normal.     Vital Signs: BP (!) 133/100 (BP Location: Left Arm)   Pulse 96   Temp 98.7  F (37.1 C) (Oral)   Resp 18   Ht 5\' 4"  (1.626 m)   Wt 70.2 kg   SpO2 93%   BMI 26.58 kg/m  Pain Scale: 0-10   Pain Score: 9    SpO2: SpO2: 93 % O2 Device:SpO2: 93 % O2 Flow Rate: .O2 Flow Rate (L/min): 4 L/min  IO: Intake/output summary:  Intake/Output Summary (Last 24 hours) at 03/30/2023 1502 Last data filed at 03/30/2023 0900 Gross per 24 hour  Intake 720 ml  Output 900 ml  Net -180 ml    LBM: Last BM Date : 03/30/23 Baseline Weight: Weight: 65 kg Most recent weight: Weight: 70.2 kg       Thank you for this consult. Palliative medicine will continue to follow and assist as needed.   Greater than 50%  of this time was spent counseling and coordinating care  related to the above assessment and plan.  Signed by: Ocie Bob, AGNP-C Palliative Medicine    Please contact Palliative Medicine Team phone at [phone] for questions and concerns.  For individual provider: See Loretha Stapler

## 2023-03-30 NOTE — Inpatient Diabetes Management (Signed)
Inpatient Diabetes Program Recommendations  AACE/ADA: New Consensus Statement on Inpatient Glycemic Control   Target Ranges:  Prepandial:   less than 140 mg/dL      Peak postprandial:   less than 180 mg/dL (1-2 hours)      Critically ill patients:  140 - 180 mg/dL    Latest Reference Range & Units 03/30/23 03:53  Glucose 70 - 99 mg/dL 64 (L)    Latest Reference Range & Units 03/29/23 07:45 03/29/23 08:04 03/29/23 08:25 03/29/23 09:45 03/29/23 12:14 03/29/23 16:37 03/29/23 20:55  Glucose-Capillary 70 - 99 mg/dL 60 (L) 63 (L) 88 784 (H) 144 (H) 200 (H) 253 (H)   Review of Glycemic Control  Diabetes history: DM2 Outpatient Diabetes medications: Levemir 10-20 units daily, Novolog 0-10 units TID with meals Current orders for Inpatient glycemic control: Levemir 10 units BID, Novolog 0-6 units TID with meals, Novolog 0-5 units QHS  Inpatient Diabetes Program Recommendations:    Insulin: Lab glucose 64 mg/dl at 6:96 am today.  Please consider decreasing Levemir to 5 units BID.  Thanks, Orlando Penner, RN, MSN, CDCES Diabetes Coordinator Inpatient Diabetes Program [phone] (Team Pager from 8am to 5pm)

## 2023-03-30 NOTE — Progress Notes (Signed)
Pt in pain 9/10. 25 mcg fentanyl given. Palliative NP present at bedside, Pt continued to endorse pain, NP instructed 1mg  dilaudid. VS in chart.

## 2023-03-30 NOTE — Progress Notes (Signed)
Hypoglycemic Event  CBG: 57   0746  Treatment: 4 oz juice/soda  Symptoms: headache  Follow-up CBG: Time:0758 CBG Result:50  Possible Reasons for Event: Inadequate meal intake  Follow up treatment: 4 g glucose tab given, Follow up CBG:  50  Treatment:  2 glucose gel packs CBG 72 CBG 93  Comments/MD notified: Marland Mcalpine, MD    Renda Rolls

## 2023-03-30 NOTE — Progress Notes (Signed)
ANTICOAGULATION CONSULT NOTE   Pharmacy Consult for IV heparin Indication: pulmonary embolus/DVT  Allergies  Allergen Reactions   Paclitaxel Anaphylaxis, Swelling and Other (See Comments)    Made everything swell   Taxotere [Docetaxel] Anaphylaxis, Swelling and Other (See Comments)    Throat swelling    Patient Measurements: Height: 5\' 4"  (162.6 cm) Weight: 70.2 kg (154 lb 14 oz) IBW/kg (Calculated) : 54.7 Heparin Dosing Weight: 70.2 kg   Vital Signs: Temp: 98.7 F (37.1 C) (09/06 1127) Temp Source: Oral (09/06 1127) BP: 133/100 (09/06 1254) Pulse Rate: 96 (09/06 1127)  Labs: Recent Labs    03/28/23 0418 03/29/23 0410 03/30/23 0353  HGB 7.2* 8.9* 9.7*  HCT 23.2* 27.2* 31.3*  PLT 182 187 195  CREATININE 5.18* 4.39* 4.23*  CKTOTAL  --  803* 416*    Estimated Creatinine Clearance: 13.9 mL/min (A) (by C-G formula based on SCr of 4.23 mg/dL (H)).   Medications:  Medications Prior to Admission  Medication Sig Dispense Refill Last Dose   acetaminophen (TYLENOL) 500 MG tablet Take 1,000 mg by mouth as needed for mild pain.   03/26/2023   amLODipine (NORVASC) 2.5 MG tablet Take 1 tablet (2.5 mg total) by mouth daily. 90 tablet 1 03/26/2023   amoxicillin-clavulanate (AUGMENTIN) 500-125 MG tablet Take 1 tablet by mouth daily for 7 days. 7 tablet 0 03/26/2023   carvedilol (COREG) 12.5 MG tablet TAKE 1 TABLET (12.5MG  TOTAL) BY MOUTH TWICE A DAY WITH MEALS (Patient taking differently: Take 12.5 mg by mouth 2 (two) times daily with a meal.) 60 tablet 2 03/26/2023   dexamethasone (DECADRON) 4 MG tablet Take 1 tablet (4 mg total) by mouth daily. 30 tablet 0 unknown   enoxaparin (LOVENOX) 60 MG/0.6ML injection Inject 0.6 mLs (60 mg total) into the skin daily. 18 mL 2 03/26/2023 at 18:30   famotidine (PEPCID) 20 MG tablet Take 20 mg by mouth 2 (two) times daily as needed for heartburn or indigestion.   Past Month   furosemide (LASIX) 20 MG tablet TAKE 1 TABLET BY MOUTH EVERY DAY (Patient  taking differently: Take 20 mg by mouth daily as needed for fluid or edema.) 30 tablet 3 03/26/2023   gabapentin (NEURONTIN) 300 MG capsule Take 1 capsule (300 mg total) by mouth 3 (three) times daily. 90 capsule 3 03/26/2023   insulin aspart (NOVOLOG) 100 UNIT/ML injection Inject 0-10 Units into the skin 3 (three) times daily before meals.   03/26/2023   insulin detemir (LEVEMIR FLEXPEN) 100 UNIT/ML FlexPen INJECT 0.2MLS (20 UNITS) INTO THE SKIN TWICE A DAY (Patient taking differently: Inject 20 Units into the skin 2 (two) times daily.) 3 mL 4 03/26/2023   lidocaine (LIDODERM) 5 % Place 1 patch onto the skin daily. Remove & Discard patch within 12 hours or as directed by MD 30 patch 0 03/26/2023   oxybutynin (DITROPAN-XL) 10 MG 24 hr tablet Take 1 tablet (10 mg total) by mouth daily. 90 tablet 1 03/26/2023   polyethylene glycol (MIRALAX / GLYCOLAX) 17 g packet Take 17 g by mouth daily as needed for mild constipation. 14 each 0 Past Week   sodium bicarbonate 650 MG tablet Take 1 tablet (650 mg total) by mouth 3 (three) times daily. (Patient taking differently: Take 650 mg by mouth 2 (two) times daily.) 90 tablet 2 03/26/2023   venlafaxine XR (EFFEXOR XR) 37.5 MG 24 hr capsule Take 1 capsule (37.5 mg total) by mouth daily with breakfast. 90 capsule 1 03/26/2023   Continuous Glucose Sensor (FREESTYLE  LIBRE 3 SENSOR) MISC 1 Act by Does not apply route daily. Place 1 sensor on the skin every 14 days. Use to check glucose continuously 2 each 11     Assessment: Pharmacy is consulted to start heparin drip ion 57 yo female with PMH of PE/DVT. Pt has been on Lovenox 1 mg/kr SQ daily dosing PTA . Plan is due to under go an orthopedic procedure on 9/10. However, due to pt's poor renal function, changing Lovenox to heparin to allow time for enoxaparin to wash out. LD on enoxaparin was on 9/5 at 2222.    Today, 03/30/23 Scr 4.23, CrCl 14 ml/min  Hgb 9.7 low but stable Plt 195      Goal of Therapy:  Heparin level 0.3-0.7  units/ml Monitor platelets by anticoagulation protocol: Yes   Plan:  Start heparin 1200 units/hr at 2200  Obtain HL 8 hours after start of infusion  Daily CBC, HL  Monitor for signs and symptoms of bleeding   Adalberto Cole, PharmD, BCPS 03/30/2023 2:36 PM

## 2023-03-31 DIAGNOSIS — N171 Acute kidney failure with acute cortical necrosis: Secondary | ICD-10-CM | POA: Diagnosis not present

## 2023-03-31 DIAGNOSIS — C7801 Secondary malignant neoplasm of right lung: Secondary | ICD-10-CM | POA: Diagnosis not present

## 2023-03-31 DIAGNOSIS — G893 Neoplasm related pain (acute) (chronic): Secondary | ICD-10-CM

## 2023-03-31 DIAGNOSIS — C7802 Secondary malignant neoplasm of left lung: Secondary | ICD-10-CM | POA: Diagnosis not present

## 2023-03-31 DIAGNOSIS — F321 Major depressive disorder, single episode, moderate: Secondary | ICD-10-CM | POA: Diagnosis not present

## 2023-03-31 DIAGNOSIS — C541 Malignant neoplasm of endometrium: Secondary | ICD-10-CM | POA: Diagnosis not present

## 2023-03-31 DIAGNOSIS — J9611 Chronic respiratory failure with hypoxia: Secondary | ICD-10-CM | POA: Diagnosis not present

## 2023-03-31 DIAGNOSIS — R4589 Other symptoms and signs involving emotional state: Secondary | ICD-10-CM

## 2023-03-31 DIAGNOSIS — Z7189 Other specified counseling: Secondary | ICD-10-CM | POA: Diagnosis not present

## 2023-03-31 DIAGNOSIS — N179 Acute kidney failure, unspecified: Secondary | ICD-10-CM | POA: Diagnosis not present

## 2023-03-31 LAB — CBC WITH DIFFERENTIAL/PLATELET
Abs Immature Granulocytes: 0.12 10*3/uL — ABNORMAL HIGH (ref 0.00–0.07)
Basophils Absolute: 0 10*3/uL (ref 0.0–0.1)
Basophils Relative: 0 %
Eosinophils Absolute: 0.1 10*3/uL (ref 0.0–0.5)
Eosinophils Relative: 2 %
HCT: 29.5 % — ABNORMAL LOW (ref 36.0–46.0)
Hemoglobin: 9.1 g/dL — ABNORMAL LOW (ref 12.0–15.0)
Immature Granulocytes: 2 %
Lymphocytes Relative: 7 %
Lymphs Abs: 0.4 10*3/uL — ABNORMAL LOW (ref 0.7–4.0)
MCH: 28.5 pg (ref 26.0–34.0)
MCHC: 30.8 g/dL (ref 30.0–36.0)
MCV: 92.5 fL (ref 80.0–100.0)
Monocytes Absolute: 0.5 10*3/uL (ref 0.1–1.0)
Monocytes Relative: 7 %
Neutro Abs: 5 10*3/uL (ref 1.7–7.7)
Neutrophils Relative %: 82 %
Platelets: 181 10*3/uL (ref 150–400)
RBC: 3.19 MIL/uL — ABNORMAL LOW (ref 3.87–5.11)
RDW: 19.1 % — ABNORMAL HIGH (ref 11.5–15.5)
WBC: 6.1 10*3/uL (ref 4.0–10.5)
nRBC: 0 % (ref 0.0–0.2)

## 2023-03-31 LAB — COMPREHENSIVE METABOLIC PANEL
ALT: 17 U/L (ref 0–44)
AST: 18 U/L (ref 15–41)
Albumin: 2 g/dL — ABNORMAL LOW (ref 3.5–5.0)
Alkaline Phosphatase: 92 U/L (ref 38–126)
Anion gap: 8 (ref 5–15)
BUN: 73 mg/dL — ABNORMAL HIGH (ref 6–20)
CO2: 22 mmol/L (ref 22–32)
Calcium: 8.2 mg/dL — ABNORMAL LOW (ref 8.9–10.3)
Chloride: 105 mmol/L (ref 98–111)
Creatinine, Ser: 3.81 mg/dL — ABNORMAL HIGH (ref 0.44–1.00)
GFR, Estimated: 13 mL/min — ABNORMAL LOW (ref >60)
Glucose, Bld: 165 mg/dL — ABNORMAL HIGH (ref 70–99)
Potassium: 4.9 mmol/L (ref 3.5–5.1)
Sodium: 135 mmol/L (ref 135–145)
Total Bilirubin: 0.2 mg/dL — ABNORMAL LOW (ref 0.3–1.2)
Total Protein: 5.8 g/dL — ABNORMAL LOW (ref 6.5–8.1)

## 2023-03-31 LAB — VAS US ABI WITH/WO TBI

## 2023-03-31 LAB — GLUCOSE, CAPILLARY
Glucose-Capillary: 165 mg/dL — ABNORMAL HIGH (ref 70–99)
Glucose-Capillary: 143 mg/dL — ABNORMAL HIGH (ref 70–99)
Glucose-Capillary: 150 mg/dL — ABNORMAL HIGH (ref 70–99)
Glucose-Capillary: 168 mg/dL — ABNORMAL HIGH (ref 70–99)

## 2023-03-31 LAB — PHOSPHORUS: Phosphorus: 5.5 mg/dL — ABNORMAL HIGH (ref 2.5–4.6)

## 2023-03-31 LAB — HEPARIN LEVEL (UNFRACTIONATED)
Heparin Unfractionated: 0.53 [IU]/mL (ref 0.30–0.70)
Heparin Unfractionated: 0.23 [IU]/mL — ABNORMAL LOW (ref 0.30–0.70)

## 2023-03-31 LAB — MAGNESIUM: Magnesium: 1.7 mg/dL (ref 1.7–2.4)

## 2023-03-31 MED ORDER — LORAZEPAM 0.5 MG PO TABS
0.5000 mg | ORAL_TABLET | Freq: Two times a day (BID) | ORAL | Status: DC
  Administered 2023-03-31 – 2023-04-05 (×12): 0.5 mg via ORAL
  Filled 2023-03-31 (×12): qty 1

## 2023-03-31 MED ORDER — OXYCODONE HCL 20 MG/ML PO CONC
10.0000 mg | ORAL | Status: DC | PRN
  Administered 2023-03-31 – 2023-04-02 (×3): 10 mg via ORAL
  Filled 2023-03-31 (×3): qty 0.5

## 2023-03-31 MED ORDER — HYDROMORPHONE HCL 1 MG/ML IJ SOLN
0.2500 mg | INTRAMUSCULAR | Status: DC | PRN
  Administered 2023-04-08: 0.25 mg via INTRAVENOUS
  Filled 2023-03-31: qty 0.5

## 2023-03-31 MED ORDER — VENLAFAXINE HCL ER 75 MG PO CP24
75.0000 mg | ORAL_CAPSULE | Freq: Every day | ORAL | Status: DC
  Administered 2023-04-01 – 2023-04-08 (×8): 75 mg via ORAL
  Filled 2023-03-31 (×8): qty 1

## 2023-03-31 MED ORDER — FENTANYL 12 MCG/HR TD PT72
1.0000 | MEDICATED_PATCH | TRANSDERMAL | Status: DC
  Administered 2023-03-31 – 2023-04-03 (×2): 1 via TRANSDERMAL
  Filled 2023-03-31 (×2): qty 1

## 2023-03-31 MED ORDER — LORAZEPAM 0.5 MG PO TABS: 0.5000 mg | ORAL_TABLET | Freq: Three times a day (TID) | ORAL | Status: DC | PRN

## 2023-03-31 NOTE — Progress Notes (Signed)
   03/31/23 1449  Assess: MEWS Score  Temp 99.1 F (37.3 C)  BP 117/80  MAP (mmHg) 93  Pulse Rate (!) 101  SpO2 94 %  O2 Device Nasal Cannula  O2 Flow Rate (L/min) 4 L/min  Assess: MEWS Score  MEWS Temp 0  MEWS Systolic 0  MEWS Pulse 1  MEWS RR 1  MEWS LOC 0  MEWS Score 2  MEWS Score Color Yellow  Assess: if the MEWS score is Yellow or Red  Were vital signs accurate and taken at a resting state? Yes  Does the patient meet 2 or more of the SIRS criteria? Yes  Does the patient have a confirmed or suspected source of infection? No  MEWS guidelines implemented  Yes, yellow  Treat  MEWS Interventions Considered administering scheduled or prn medications/treatments as ordered  Take Vital Signs  Increase Vital Sign Frequency  Yellow: Q2hr x1, continue Q4hrs until patient remains green for 12hrs  Escalate  MEWS: Escalate Yellow: Discuss with charge nurse and consider notifying provider and/or RRT  Notify: Charge Nurse/RN  Name of Charge Nurse/RN Notified Jennifer Huff,RN  Assess: SIRS CRITERIA  SIRS Temperature  0  SIRS Pulse 1  SIRS Respirations  1  SIRS WBC 0  SIRS Score Sum  2

## 2023-03-31 NOTE — Progress Notes (Signed)
Daily Progress Note   Patient Name: Jaime Gomez       Date: 03/31/2023 DOB: 01/01/1966  Age: 57 y.o. MRN#: [id number] Attending Physician: Jaime Laughter, DO Primary Care Physician: Jaime Georges, MD Admit Date: 03/27/2023  Reason for Consultation/Follow-up: Establishing goals of care and Pain control  Patient Profile/HPI:  57 y.o. 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  Plans for chemotherapy have been on hold due to hospitalizations and to await surgery. Admitted for acute on chronic renal injury and worsening dypnea, and anemia. Palliative medicine consulted for GOC.    Subjective: Chart reviewed including labs, progress notes, imaging from this and previous encounters.  24 hour OME = 120mg .  She reports better pain control, however, hydromorphone knocks her out when she receives it.  She slept well after a difficult evening, although was woken too frequently for care.  She is very tired and just wants to rest.  Vascular surgery visited and indicated no need for vascular intervention related to toe ulcer.  Oncology met with her and discussed future treatments may not be possible.  We discussed her multiple comorbidities in the setting of her progressing cancer.  We discussed the risks vs benefit of proceeding with surgery vs focusing on symptom management. Concerns for poor wound healing due to low albumin, her pulmonary status, post surgical pain and need for physical therapy. She would like to speak to orthopedic surgery about these concerns.   Option for transition to comfort focused care with hospice support discussed.  Her twin sister Jaime Gomez was present and very supportive. Jaime Gomez is concerned about her sisters' well-being.  Jaime Gomez also having some anxiety and depression related to her illness state and pain.   Review of Systems  Constitutional:  Positive for malaise/fatigue.  Musculoskeletal:  Positive for back pain.     Physical Exam Vitals and nursing note reviewed.  Constitutional:      General: She is not in acute distress. Neurological:     Mental Status: She is alert.  Psychiatric:     Comments: Tearful at times  Vital Signs: BP 137/87 (BP Location: Left Arm)   Pulse 87   Temp 98.7 F (37.1 C) (Oral)   Resp 15   Ht 5\' 4"  (1.626 m)   Wt 71.5 kg   SpO2 90%   BMI 27.06 kg/m  SpO2: SpO2: 90 % O2 Device: O2 Device: Nasal Cannula O2 Flow Rate: O2 Flow Rate (L/min): 4 L/min  Intake/output summary:  Intake/Output Summary (Last 24 hours) at 03/31/2023 1215 Last data filed at 03/31/2023 0600 Gross per 24 hour  Intake 191.36 ml  Output --  Net 191.36 ml   LBM: Last BM Date : 03/30/23 Baseline Weight: Weight: 65 kg Most recent weight: Weight: 71.5 kg       Palliative Assessment/Data: PPS: 40%      Patient Active Problem List   Diagnosis Date Noted   Malignant neoplasm metastatic to both lungs (HCC) 03/28/2023   Chronic respiratory failure with hypoxia (HCC) 03/27/2023   Rhabdomyolysis 03/27/2023   Mixed stress and urge urinary incontinence 03/01/2023   Constipation 02/09/2023   Cancer associated pain 02/09/2023   High risk medication use 02/09/2023   DNR (do not resuscitate) 02/09/2023   Need for emotional support 02/09/2023   Acute renal failure superimposed on stage 4 chronic kidney disease (HCC) 02/09/2023   Counseling and coordination of care 02/09/2023   Medication management 02/09/2023   Palliative care encounter 02/09/2023   Pulmonary embolism (HCC) 02/09/2023   Left  lateral abdominal pain 02/09/2023   Pulmonary emboli (HCC) 02/08/2023   Abnormal urinalysis 02/08/2023   Leg mass 02/08/2023   CKD (chronic kidney disease) stage 4, GFR 15-29 ml/min (HCC) 12/27/2022   Depression, major, single episode, moderate (HCC) 12/02/2022   Sepsis secondary to UTI (HCC) 11/28/2022   Sepsis due to gram-negative UTI (HCC) 11/28/2022   Abnormality of lung on CXR 11/28/2022   Metabolic acidosis 11/28/2022   IDA (iron deficiency anemia) 04/28/2022   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   Hyperkalemia 09/02/2021   Acute renal failure superimposed on stage 3a chronic kidney disease (HCC) 09/01/2021   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) 09/01/2021   Anemia due to antineoplastic chemotherapy (CODE) 09/01/2021   Chronic kidney disease, stage 3a (HCC) 09/01/2021   Primary hypertension 09/01/2021   Gastroparesis 09/01/2021   Personal history of other venous thrombosis and embolism 09/01/2021   Personal history of pulmonary embolism 09/01/2021   Type 2 diabetes mellitus without complications (HCC) 09/01/2021   Endometrial cancer, FIGO stage IVB (HCC) 08/18/2021   Goals of care, counseling/discussion 08/18/2021   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 failure 08/18/2021   Leg DVT (deep venous thromboembolism), chronic, right (HCC) 08/18/2021    Palliative Care Assessment & Plan    Assessment/Recommendations/Plan  Symptom management:  Pain:  Agree with starting Fentanyl 12.65mcg TD q3 days Decrease hydromorphone to 0.25 q3hr prn IV Change oxycodone liquid to 10mg  q4hr prn (for pain and/or SOB) Anxiety and Depression:  Lorazepam po 0.5mg  BID and q8 hr prn Increase Effexor to 75mg  daily  Goals of care:  Jaime Gomez is considering transition to focused comfort care- hospice would be appropriate if she decides to make that transition. She would like to speak to orthopedic surgery re: risk/benefit of surgical intervention for her impending fracture- there are  concerns regarding post op pain, recovery, renal function, wound healing r/t low albumin and her pulmonary state with pulmonary metastasis and PE    Code Status: DNR  Prognosis:  < 6 months  Discharge Planning: To Be Determined  Care plan was discussed  with patient, her sister and care team.   Thank you for allowing the Palliative Medicine Team to assist in the care of this patient.  Total time:  90 minutes  Greater than 50%  of this time was spent counseling and coordinating care related to the above assessment and plan.  Ocie Bob, AGNP-C Palliative Medicine   Please contact Palliative Medicine Team phone at [phone] for questions and concerns.

## 2023-03-31 NOTE — Progress Notes (Signed)
PT Cancellation Note  Patient Details Name: Jaime Gomez MRN: [id number] DOB: 03-01-1966   Cancelled Treatment:    Reason Eval/Treat Not Completed: Patient declined, no reason specified Pt declined participating this morning due to not resting well last night and requested PT to check back.  Upon checking back, pt sleeping.   Janan Halter [place] 03/31/2023, 4:07 PM Paulino Door, DPT Physical Therapist [facility] Services Office: [phone]

## 2023-03-31 NOTE — Progress Notes (Incomplete Revision)
ANTICOAGULATION CONSULT NOTE   Pharmacy Consult for IV heparin Indication: pulmonary embolus/DVT (LMWH on hold)  Allergies  Allergen Reactions   Paclitaxel Anaphylaxis, Swelling and Other (See Comments)    Made everything swell   Taxotere [Docetaxel] Anaphylaxis, Swelling and Other (See Comments)    Throat swelling    Patient Measurements: Height: 5\' 4"  (162.6 cm) Weight: 71.5 kg (157 lb 10.1 oz) IBW/kg (Calculated) : 54.7 Heparin Dosing Weight: 69 kg  Vital Signs: Temp: 98.7 F (37.1 C) (09/07 0423) Temp Source: Oral (09/07 0423) BP: 137/87 (09/07 0423) Pulse Rate: 87 (09/07 0423)  Labs: Recent Labs    03/29/23 0410 03/30/23 0353 03/31/23 0835  HGB 8.9* 9.7*  --   HCT 27.2* 31.3*  --   PLT 187 195  --   HEPARINUNFRC  --   --  0.53  CREATININE 4.39* 4.23*  --   CKTOTAL 803* 416*  --     Estimated Creatinine Clearance: 14.1 mL/min (A) (by C-G formula based on SCr of 4.23 mg/dL (H)).  Medications: Therapeutic LMWH chronically  Assessment: Pharmacy is consulted to dose heparin drip on 57 yo female with PMH of PE/DVT. Pt chronically anticoagulated with enoxaparin 60 mg subQ daily PTA (~1 mg/kg). Hx CKD but AKI on admission.   Planning for ortho surgery on 9/10. Anticoagulation transitioned to UFH for peri-procedural anticoagulation. Last dose of enoxaparin on 9/5 at 2222.    Today, 03/31/23 Heparin level = 0.53 is therapeutic on heparin infusion of 1200 units/hr CBC: Pending, has not been drawn yet Confirmed with RN - no signs of bleeding CrCl remains < 30 mL/min  Goal of Therapy:  Heparin level 0.3-0.7 units/ml Monitor platelets by anticoagulation protocol: Yes   Plan:  Continue heparin infusion at 1200 units/hr Check confirmatory 8 hour heparin level CBC, heparin level daily Monitor for signs of bleeding  Will await orders from ortho regarding when to hold heparin drip prior to surgery.   Cindi Carbon, PharmD 03/31/23 10:14 AM  Addendum - Evening  Follow Up:  Assessment: Heparin level =

## 2023-03-31 NOTE — Progress Notes (Signed)
It sounds like Ms. Marsicano had a rough day yesterday.  I know that [facility]iative Care saw her.  As always, they provide a lot of excellent recommendations.  Clearly, this could be pain control that would be the priority.  I still think that she is going need some kind of longer acting pain medication.  I am always liked Duragesic patch as.  I do not see why we cannot try this for her.  She is getting some morphine nebulizers.  She is on a heparin infusion.  This is in anticipation of her surgery for the right lower leg on Tuesday.  There are no labs back yet.  She did not eat much yesterday.  She just did not feel like eating.  There is no nausea or vomiting.  She has had no bleeding.  I had a talk with her this morning.  I told her that I was very unknown certain as to whether or not we would be able to give her chemotherapy.  Every time we try to think about chemotherapy, something happens to her and she ends up in the hospital.  This we can see her.  Of note, I checked her prealbumin couple days ago.  It is only 9.  Whenever I see a prealbumin less than 10, I just do not do not give chemotherapy as the side effects clearly outweigh the benefits.  I am glad that she is now a DNR.  I think this is a good decision.  I will try her on a Duragesic patch.  We will try her on a low-dose Duragesic patch.  Maybe, this will help provide little bit of relief for her.  Again, I know she is trying hard.  I am just not sure how much reserve she has left given this low prealbumin.    I do appreciate the great care she is getting from everybody on [address].  [name], MD  Colossians 3:23

## 2023-03-31 NOTE — Progress Notes (Signed)
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  She is having progressive dyspnea and likely due to her pulmonary metastasis and was recently started on supplemental oxygen.  She was also recently started on treatment for urinary tract infection which grew out Enterococcus.  She reports that she is not experiencing any dysuria, fevers, chills, vomiting or diarrhea but has been progressively weaker with increasing fatigue and worsening cancer-related pain.  Her pain does improve with oxycodone at home.  But her sister reported that she was having worsening appetite.  Given her generalized weakness fatigue and pain she is brought to the ED and chest x-ray is notable for stable perihilar nodular opacities.  Blood work was done and showed a worsening AKI on CKD stage IV.  Blood cultures were obtained and she was given 1-1/2 L of LR, fentanyl and IV insulin.  She was transferred for [facility] for further evaluation and admission.  Medical oncology was consulted and are recommending transfusing 2 units of PRBCs.  Patient also has an impending fracture given the metastasis to her right tibia and was supposed to have surgical intervention to repair this.  She was transfused 2 units of PRBCs and the medical oncologist regarding discussed with orthopedic surgeon and recommending that she have a surgical repair of her right lower leg if possible and this is being planned for Tuesday.    She has a new noted ulcer on her  toes show we obtained ABIs and MRI of her foot and have called vascular surgeon and they feel that she will heal without intervention.  She continued to have some abdominal pain so CT scan of the abdomen was repeated and palliative is adjusting her medications and patient wants to speak with orthopedic surgery about her impending fracture and surgery coming up given that she she is considering comfort focused care.  Assessment and Plan:  AKI superimposed on CKD IV, slowly improving Rhabdomyolysis in the setting of fall Metabolic acidosis -BUN/Cr Trend: Recent Labs  Lab 03/26/23 0324 03/27/23 1245 03/27/23 1750 03/28/23 0418 03/29/23 0410 03/30/23 0353 03/31/23 1050  BUN 113* 97* 97* 93* 84* 77* 73*  CREATININE 5.70* 5.53* 5.11* 5.18* 4.39* 4.23* 3.81*  -Patient is being hydrated with sodium bicarb and 150 mEq in sterile water at 100 mL/h for 20 hours which has not been discontinued -Medical oncology is typing and screening and transfusing 2 units of PRBCs -Patient's rhabdomyolysis is slowly improving and CK went from 3764 -> 2751 -> 803 and is now 416 and will repeat in the a.m. -Metabolic acidosis on admission which is now slowly improving.  Patient CO2 is now 19, anion gap of 13, chloride level is now 103 -Avoid Nephrotoxic Medications, Contrast Dyes if possible, Hypotension and Dehydration to Ensure Adequate Renal Perfusion and will need to Renally Adjust Meds -Continue to Monitor and Trend Renal Function carefully and repeat CMP in the AM  -Continue with supportive care as renal function is improving very slowly and she will be changed to heparin drip  Endometrial Cancer with Metastases - Has received multiple different treatments  under the care of Dr. Myna Hidalgo, now wants to focus on quality of life and asks that we consult Palliative Care this admission  -Continue with supportive care and pain control and continue with oxycodone 5 mg p.o. every 4 as needed for moderate pain as well  as bowel regimen with senna docusate 1 tab p.o. nightly as needed for mild constipation -See below -Consult Palliative Care, notify Dr. Myna Hidalgo of admission   -Dr. Myna Hidalgo has evaluated and recommending transfusing 2 units of PRBCs and will discuss with orthopedic surgery about her impending leg fracture from the metastasis; she received 20 mg of IV Lasix between her blood -Appreciate medical oncology evaluation for further evaluation recommendations   Hx of DVT and PE  -Continued with enoxaparin 60 mg subcu every 24 for now but tonight we will change her to heparin drip in anticipation for surgical intervention on Tuesday   Insulin-dependent DM with associated hypoglycemia -A1c was 7.7% in August 2024  -Check CBGs and continue insulin detemir but reduced to 5 units subcu twice daily as well as very sensitive NovoLog/scale insulin before meals and at bedtime -Continue monitor CBGs and glucose trends carefully and adjust as necessary; will continue with hypoglycemic protocols -CBG Trend: Recent Labs  Lab 03/30/23 0834 03/30/23 0902 03/30/23 1124 03/30/23 1633 03/30/23 2055 03/31/23 0856 03/31/23 1133  GLUCAP 72 93 150* 158* 163* 165* 143*  -Glucose Trend: Recent Labs  Lab 03/26/23 0324 03/27/23 1245 03/27/23 1750 03/28/23 0418 03/29/23 0410 03/30/23 0353 03/31/23 1050  GLUCOSE 199* 317* 218* 173* 69* 64* 165*    Hypertension  -BP was low in ED and antihypertensives held on admission  -Continue to monitor blood pressures per protocol -Last blood pressure was 117/80   Depression and Anxiety -Continue with venlafaxine XR 37.5 mg p.o. daily with breakfast  Recent Enterococcus UTI -Pansensitive and was placed on amoxicillin which will be continued at 250 mg p.o. every 12 given her renal function -Most recent urinalysis done showed a turbid appearance with 50 glucose, large hemoglobin, large leukocytes, negative nitrites, 100 protein, no bacteria seen, present mucus, present  budding yeast, greater than 50 RBCs per high-power field, 0-5 squamous epithelial cells, greater than 50 WBCs -Urine culture is negative and showing no growth given that she was on antibiotics prior to admission -Blood cultures x 2 obtained and showing no growth to date so far at 4 Days  Normocytic Anemia/Anemia of Chronic Disease -Hgb/Hct Trend:  Recent Labs  Lab 03/26/23 0324 03/27/23 1245 03/27/23 1253 03/28/23 0418 03/29/23 0410 03/30/23 0353 03/31/23 1050  HGB 7.5* 8.4* 8.8* 7.2* 8.9* 9.7* 9.1*  HCT 22.0* 26.4* 26.0* 23.2* 27.2* 31.3* 29.5*  MCV  --  89.2  --  92.4 86.1 89.9 92.5  -Iron/anemia panel was checked and showed an iron level of 44, UIBC 103, TIBC 147, saturation ratios of 30% -Medical oncology was consulted and transfusion patient 2 units of PRBCs -Continue to monitor for signs and symptoms bleeding; no overt bleeding note -Repeat CBC in a.m.  Abdominal Discomfort and likely Visceral Pain from Mets -Repeat CT scan of the abdomen pelvis without contrast and obtain a KUB  -CT Scan repeatd and done and showed "No acute intra-abdominal or pelvic pathology.  Bilateral ureteral stents in similar position as the prior CT. Similar appearance of mild right hydronephrosis. Cholelithiasis. Mild sigmoid diverticulosis. No bowel obstruction. Normal appendix.  Aortic Atherosclerosis." -On 03/30/2023 palliative consulted and they feel that her pain is visceral from lung metastasis and from possible metastasis and  they feel that ideally she would benefit from dexamethasone but given her plans for surgical intervention on Tuesday they do not want immunocompromise or and we cannot use NSAIDs or anti-inflammatories given her poor renal function so they have also started her on Flexeril 5 mg 3 times daily as needed as well as a bowel regimen and also going to give her morphine 10 mg nebulized every 2 as needed for shortness of breath -Medical oncology is now starting her on a Duragesic patch and  now the palliative care team has decreased her hydromorphone to 0.25 mg every 3 as needed for severe pain and changed oxycodone liquid to 10 mg every 4 as needed and have added lorazepam 0.5 mg p.o. twice daily and every 8 as needed as well as increasing her Effexor  Dizziness, stable -Check Orthostatics in the AM; Also was hypoglycemic  -Given a 500 mL bolus the day before yesterday -Obtain PT vestibular evaluation given that she was dizzy just lying in the bed as well -Recent Head CT done and showed "No acute intracranial abnormality. No acute displaced fracture or traumatic listhesis of the cervical spine. Biapical ground-glass and consolidative peribronchovascular airspace opacities. 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 Jerl Santos and the PA and plan is for surgical intervention on Tuesday -Palliative care consulted and patient is considering transitioning to a focus comfort care approach with hospice being appropriate if she decides to transition however she would like to speak to orthopedic surgery team about the risk and benefits of her surgical intervention for impending fracture given the concerns for postop pain, recovery, renal function as well as wound healing and also given her pulmonary state given the pulm metastasis with PE  Right toe ulcer and wound -Has a full-thickness ulcer and will check MRI of the foot -Wound care consulted and also check ABIs -ABI's done and showed "Resting right ankle-brachial index indicates noncompressible right lower extremity arteries. Unable to  obtain TBI due to great toe open wound/bandages. Left: Resting left ankle-brachial index indicates noncompressible left lower extremity arteries. The left toe-brachial index is abnormal." -MRI Foot done and showed no evidence of foot ulcer on exam but did show "No evidence of soft tissue abscess or osteomyelitis in the right forefoot. Suspected subacute fracture of the 5th metatarsal neck. Recommend plain film and clinical correlation. Mild degenerative changes as described. No evidence of metastatic disease in the foot." -Discussed with Vascular Surgery Dr. Lenell Antu and he evaluated the patient and given her reassuring physical exam and noninvasive testing he feels that she will heal without intervention -Will reconsult wound care  Hypoalbuminemia -Patient's Albumin Trend: Recent Labs  Lab 03/09/23 1326 03/23/23 0725 03/26/23 0300 03/27/23 1245 03/29/23 0410 03/30/23 0353 03/31/23 1050  ALBUMIN 3.6 2.4* 2.1* 2.3* 2.2* 2.3* 2.0*  -Continue to Monitor and Trend and repeat CMP in the AM  Generalized Weakness and Physical Deconditioning -Will obtain PT and OT to further evaluate and she is status post IV fluid hydration -Patient declined PT today   DVT prophylaxis: Anticoagulated with a heparin drip    Code Status: Limited: Do not attempt resuscitation (DNR) -DNR-LIMITED -Do  Not Intubate/DNI  Family Communication: Discussed with sister at bedside  Disposition Plan:  Level of care: Progressive Status is: Inpatient Remains inpatient appropriate because: Needs further clinical improvement and continued goals of care discussion   Consultants:  Medical Oncology Orthopedic Surgery Palliative Care Medicine Vascular Surgery  Procedures:  As delineated as above  Antimicrobials:  Anti-infectives (From admission, onward)    Start     Dose/Rate Route Frequency Ordered Stop   03/28/23 1000  amoxicillin (AMOXIL) capsule 250 mg  Status:  Discontinued        250 mg Oral Every 12 hours  03/28/23 0640 03/29/23 1914       Subjective: Seen and examined at bedside and she is still having some abdominal discomfort and pain and also having some pain in her leg.  States that she feels okay.  Anxious about her upcoming surgical procedure.  No chest pain.  Continues to have some lightheadedness.  No other concerns or complaints at this time.  Objective: Vitals:   03/31/23 1333 03/31/23 1343 03/31/23 1439 03/31/23 1449  BP: 117/77   117/80  Pulse: (!) 101   (!) 101  Resp: 18 (!) 28 20   Temp: 98.2 F (36.8 C)   99.1 F (37.3 C)  TempSrc: Oral   Oral  SpO2: 98% 97%  94%  Weight:      Height:        Intake/Output Summary (Last 24 hours) at 03/31/2023 1648 Last data filed at 03/31/2023 1523 Gross per 24 hour  Intake 303.96 ml  Output --  Net 303.96 ml   Filed Weights   03/29/23 0629 03/30/23 0500 03/31/23 0500  Weight: 70.9 kg 70.2 kg 71.5 kg   Examination: Physical Exam:  Constitutional: WN/WD overweight chronically ill-appearing Caucasian female who appears uncomfortable Respiratory: Diminished to auscultation bilaterally with coarse breath sounds, no wheezing, rales, rhonchi or crackles. Normal respiratory effort and patient is not tachypenic. No accessory muscle use.  Unlabored breathing Cardiovascular: RRR, no murmurs / rubs / gallops. S1 and S2 auscultated. .  Abdomen: Soft, tender to palpate and distended secondary body habitus. Bowel sounds positive.  GU: Deferred. Musculoskeletal: No clubbing / cyanosis of digits/nails. No joint deformity upper and lower extremities.  Skin: No rashes on a limited skin evaluation but does have a toe ulcer noted. No induration; Warm and dry.  Neurologic: CN 2-12 grossly intact with no focal deficits. Romberg sign and cerebellar reflexes not assessed.  Psychiatric: Normal judgment and insight. Alert and oriented x 3.  Has a slightly anxious mood  Data Reviewed: I have personally reviewed following labs and imaging  studies  CBC: Recent Labs  Lab 03/27/23 1245 03/27/23 1253 03/28/23 0418 03/29/23 0410 03/30/23 0353 03/31/23 1050  WBC 6.3  --  4.3 6.6 6.1 6.1  NEUTROABS 4.9  --   --  5.3 4.9 5.0  HGB 8.4* 8.8* 7.2* 8.9* 9.7* 9.1*  HCT 26.4* 26.0* 23.2* 27.2* 31.3* 29.5*  MCV 89.2  --  92.4 86.1 89.9 92.5  PLT 256  --  182 187 195 181   Basic Metabolic Panel: Recent Labs  Lab 03/27/23 1750 03/28/23 0418 03/29/23 0410 03/30/23 0353 03/31/23 1050  NA 133* 135 137 136 135  K 4.9 4.3 3.4* 5.0 4.9  CL 104 103 101 100 105  CO2 16* 19* 21* 22 22  GLUCOSE 218* 173* 69* 64* 165*  BUN 97* 93* 84* 77* 73*  CREATININE 5.11* 5.18* 4.39* 4.23* 3.81*  CALCIUM 8.0* 7.9* 7.8* 7.9* 8.2*  MG  --   --  1.5* 2.0 1.7  PHOS  --   --  5.9* 5.0* 5.5*   GFR: Estimated Creatinine Clearance: 15.6 mL/min (A) (by C-G formula based on SCr of 3.81 mg/dL (H)). Liver Function Tests: Recent Labs  Lab 03/26/23 0300 03/27/23 1245 03/29/23 0410 03/30/23 0353 03/31/23 1050  AST 62* 74* 41 36 18  ALT 17 32 24 24 17   ALKPHOS 114 111 93 111 92  BILITOT 0.2* 0.2* 0.5 0.5 0.2*  PROT 6.3* 6.8 5.8* 6.3* 5.8*  ALBUMIN 2.1* 2.3* 2.2* 2.3* 2.0*   No results for input(s): "LIPASE", "AMYLASE" in the last 168 hours. No results for input(s): "AMMONIA" in the last 168 hours. Coagulation Profile: Recent Labs  Lab 03/27/23 1258  INR 1.2   Cardiac Enzymes: Recent Labs  Lab 03/26/23 0300 03/27/23 1330 03/29/23 0410 03/30/23 0353  CKTOTAL 3,764* 2,751* 803* 416*   BNP (last 3 results) No results for input(s): "PROBNP" in the last 8760 hours. HbA1C: No results for input(s): "HGBA1C" in the last 72 hours. CBG: Recent Labs  Lab 03/30/23 1124 03/30/23 1633 03/30/23 2055 03/31/23 0856 03/31/23 1133  GLUCAP 150* 158* 163* 165* 143*   Lipid Profile: No results for input(s): "CHOL", "HDL", "LDLCALC", "TRIG", "CHOLHDL", "LDLDIRECT" in the last 72 hours. Thyroid Function Tests: No results for input(s): "TSH",  "T4TOTAL", "FREET4", "T3FREE", "THYROIDAB" in the last 72 hours. Anemia Panel: No results for input(s): "VITAMINB12", "FOLATE", "FERRITIN", "TIBC", "IRON", "RETICCTPCT" in the last 72 hours. Sepsis Labs: Recent Labs  Lab 03/26/23 0324 03/27/23 1258 03/27/23 1443  LATICACIDVEN 0.7 1.8 1.4    Recent Results (from the past 240 hour(s))  SARS Coronavirus 2 by RT PCR (hospital order, performed in [facility] hospital lab) *cepheid single result test* Anterior Nasal Swab     Status: None   Collection Time: 03/23/23  7:26 AM   Specimen: Anterior Nasal Swab  Result Value Ref Range Status   SARS Coronavirus 2 by RT PCR NEGATIVE NEGATIVE Final    Comment: (NOTE) SARS-CoV-2 target nucleic acids are NOT DETECTED.  The SARS-CoV-2 RNA is generally detectable in upper and lower respiratory specimens during the acute phase of infection. The lowest concentration of SARS-CoV-2 viral copies this assay can detect is 250 copies / mL. A negative result does not preclude SARS-CoV-2 infection and should not be used as the sole basis for treatment or other patient management decisions.  A negative result may occur with improper specimen collection / handling, submission of specimen other than nasopharyngeal swab, presence of viral mutation(s) within the areas targeted by this assay, and inadequate number of viral copies (<250 copies / mL). A negative result must be combined with clinical observations, patient history, and epidemiological information.  Fact Sheet for Patients:   RoadLapTop.co.za  Fact Sheet for Healthcare Providers: http://kim-miller.com/  This test is not yet approved or  cleared by the [place] FDA and has been authorized for detection and/or diagnosis of SARS-CoV-2 by FDA under an Emergency Use Authorization (EUA).  This EUA will remain in effect (meaning this test can be used) for the duration of the COVID-19 declaration under  Section 564(b)(1) of the Act, 21 U.S.C. section 360bbb-3(b)(1), unless the authorization is terminated or revoked sooner.  Performed at [facility], [address] [place], [place] [address]   Blood culture (routine x 2)     Status: None   Collection Time: 03/23/23  7:40 AM   Specimen: BLOOD  Result Value Ref  Range Status   Specimen Description   Final    BLOOD LEFT ANTECUBITAL Performed at [facility], [address] [place], [place] [address]    Special Requests   Final    BOTTLES DRAWN AEROBIC AND ANAEROBIC Blood Culture results may not be optimal due to an inadequate volume of blood received in culture bottles Performed at [facility], [address] [place], [place] [address]    Culture   Final    NO GROWTH 5 DAYS Performed at [facility], [address], [place], [place] [address]    Report Status 03/28/2023 FINAL  Final  Blood culture (routine x 2)     Status: None   Collection Time: 03/23/23  7:55 AM   Specimen: BLOOD  Result Value Ref Range Status   Specimen Description   Final    BLOOD RIGHT ANTECUBITAL Performed at [facility], [address] [place], [place] [address]    Special Requests   Final    BOTTLES DRAWN AEROBIC AND ANAEROBIC Blood Culture adequate volume Performed at [facility], [address] [place], [place] [address]    Culture   Final    NO GROWTH 5 DAYS Performed at [facility], [address] [place], [place] [address]    Report Status 03/28/2023 FINAL  Final  Urine Culture     Status: Abnormal   Collection Time: 03/23/23 10:45 AM   Specimen: Urine, Random  Result Value Ref Range Status   Specimen Description   Final    URINE, RANDOM Performed at [facility], [address] [place], [place] [address]    Special Requests   Final    NONE Reflexed from [id number] Performed at [facility], [address] [place], [place] [address]     Culture (A)  Final    50,000 COLONIES/mL ENTEROCOCCUS FAECALIS 50,000 COLONIES/mL DIPHTHEROIDS(CORYNEBACTERIUM SPECIES) Standardized susceptibility testing for this organism is not available. Performed at [facility], [address] [place], [place] [address]    Report Status 03/25/2023 FINAL  Final   Organism ID, Bacteria ENTEROCOCCUS FAECALIS (A)  Final      Susceptibility   Enterococcus faecalis - MIC*    AMPICILLIN <=2 SENSITIVE Sensitive     NITROFURANTOIN <=16 SENSITIVE Sensitive     VANCOMYCIN 2 SENSITIVE Sensitive     * 50,000 COLONIES/mL ENTEROCOCCUS FAECALIS  Blood Culture (routine x 2)     Status: None (Preliminary result)   Collection Time: 03/27/23 12:44 PM   Specimen: BLOOD  Result Value Ref Range Status   Specimen Description   Final    BLOOD RIGHT ANTECUBITAL Performed at [facility], [address] [place], [place] [address]    Special Requests   Final    BOTTLES DRAWN AEROBIC AND ANAEROBIC Blood Culture adequate volume Performed at [facility], [address], [place], [place] [address]    Culture   Final    NO GROWTH 4 DAYS Performed at [facility], [address] [place], [place] [address]    Report Status PENDING  Incomplete  Blood Culture (routine x 2)     Status: None (Preliminary result)   Collection Time: 03/27/23  1:17 PM   Specimen: BLOOD  Result Value Ref Range Status   Specimen Description   Final    BLOOD LEFT ANTECUBITAL Performed at  [facility], [address] [place], [place] [address]    Special Requests   Final    BOTTLES DRAWN AEROBIC ONLY Blood Culture adequate volume Performed at [facility], [address] [place], [place] [address]    Culture   Final    NO GROWTH 4 DAYS Performed at [facility], [address] [place], [place] [address]    Report Status PENDING  Incomplete  Urine Culture     Status: None   Collection Time: 03/27/23  6:18 PM   Specimen:  Urine, Random  Result Value Ref Range Status   Specimen Description   Final    URINE, RANDOM Performed at [facility], [address], [place], [place] [address]    Special Requests   Final    NONE Reflexed from [id number] Performed at [facility], [address], [place], [place] [address]    Culture   Final    NO GROWTH Performed at [facility], [address], [place], [place] [address]    Report Status 03/28/2023 FINAL  Final    Radiology Studies: CT ABDOMEN PELVIS WO CONTRAST  Result Date: 03/30/2023 CLINICAL DATA:  Abdominal pain.  Metastatic endometrial cancer. EXAM: CT ABDOMEN AND PELVIS WITHOUT CONTRAST TECHNIQUE: Multidetector CT imaging of the abdomen and pelvis was performed following the standard protocol without IV contrast. RADIATION DOSE REDUCTION: This exam was performed according to the departmental dose-optimization program which includes automated exposure control, adjustment of the mA and/or kV according to patient size and/or use of iterative reconstruction technique. COMPARISON:  CT of the chest abdomen pelvis dated 03/26/2023. FINDINGS: Evaluation of this exam is limited in the absence of intravenous contrast. Lower chest: Partially visualized bilateral pulmonary masses and patchy hazy densities as seen on the prior CT. The tip of a central venous line noted at the cavoatrial junction. There is advanced 3 vessel coronary vascular calcification. No intra-abdominal free air or free fluid. Hepatobiliary: The liver is unremarkable. No biliary dilatation. Noncalcified gallstone. No pericholecystic fluid or evidence of acute cholecystitis by CT. Pancreas: Unremarkable. No pancreatic ductal dilatation or surrounding inflammatory changes. Spleen: No acute findings.  Upper pole scarring. Adrenals/Urinary Tract: The adrenal glands are unremarkable. Bilateral ureteral stents in similar position. Renal vascular calcifications noted bilaterally.  Nonobstructing bilateral renal calculi may be present. There is similar appearance of mild right hydronephrosis. The urinary bladder is grossly unremarkable. Stomach/Bowel: Mild sigmoid diverticulosis without active inflammatory changes. There is no bowel obstruction or active inflammation. The appendix is normal. Vascular/Lymphatic: Mild aortoiliac atherosclerotic disease. An infrarenal IVC filter is noted. No portal venous gas. There is no adenopathy. Reproductive: Hysterectomy.  No adnexal masses. Other: Midline vertical anterior pelvic wall incisional scar. A ventral hernia repair mesh is noted. There is abutment of several loops of small bowel to the hernia repair mesh suggestive of adhesions. Musculoskeletal: Osteopenia with mild degenerative changes. No acute osseous pathology. IMPRESSION: 1. No acute intra-abdominal or pelvic pathology. 2. Bilateral ureteral stents in similar position as the prior CT. Similar appearance of mild right hydronephrosis. 3. Cholelithiasis. 4. Mild sigmoid diverticulosis. No bowel obstruction. Normal appendix. 5.  Aortic Atherosclerosis (ICD10-I70.0). Electronically Signed   By: Elgie Collard M.D.   On: 03/30/2023 15:12   MR FOOT RIGHT WO CONTRAST  Result Date: 03/30/2023 CLINICAL DATA:  Soft tissue infection suspected, foot. Tibial metastasis. Reported toe ulcer. EXAM: MRI OF THE RIGHT FOREFOOT WITHOUT CONTRAST TECHNIQUE: Multiplanar, multisequence  MR imaging of the right forefoot was performed. No intravenous contrast was administered. COMPARISON:  Limited correlation made with previous MRI of the right lower leg 02/09/2023 which does not include the forefoot. No comparison radiographs. FINDINGS: Bones/Joint/Cartilage The site of the reported toe ulcer is not indicated and is not obvious to me. There is heterogeneous fat saturation on the T2 weighted images through the toes. No suspicious osseous findings are identified within the toes on the T1 weighted or inversion  recovery images. There appears to be a subacute fracture of the 5th metatarsal neck with associated marrow edema. No cortical destruction identified. No other fractures are seen. There are degenerative changes at the 1st metatarsophalangeal joint. Mild midfoot degenerative changes are noted. No significant joint effusions. Ligaments Intact Lisfranc ligament. The collateral ligaments of the metatarsophalangeal joints appear intact. Muscles and Tendons Generalized muscular T2 hyperintensity, likely related to diabetes. No evidence of forefoot tendon tear or significant tenosynovitis. Soft tissues No obvious skin ulcer identified in the toes. There is no evidence of soft tissue abscess or metastatic disease. Mild nonspecific dorsal subcutaneous edema without focal fluid collection. IMPRESSION: 1. No evidence of soft tissue abscess or osteomyelitis in the right forefoot. 2. Suspected subacute fracture of the 5th metatarsal neck. Recommend plain film and clinical correlation. 3. Mild degenerative changes as described. 4. No evidence of metastatic disease in the foot. Electronically Signed   By: Carey Bullocks M.D.   On: 03/30/2023 13:25   VAS [place] ABI WITH/WO TBI  Result Date: 03/30/2023  LOWER EXTREMITY DOPPLER STUDY Patient Name:  Jaime Gomez  Date of Exam:   03/30/2023 Medical Rec #: [id number]     Accession #:    [id number] Date of Birth: Apr 01, 1966     Patient Gender: F Patient Age:   34 years Exam Location:  [facility] Procedure:      VAS [place] ABI WITH/WO TBI Referring Phys: Marguerita Merles --------------------------------------------------------------------------------  Indications: Parasthesia High Risk Factors: Diabetes.  Limitations: Today's exam was limited due to an open wound and bandages. Comparison Study: No prior studies. Performing Technologist: Olen Cordial RVT  Examination Guidelines: A complete evaluation includes at minimum, Doppler waveform signals and systolic blood pressure reading at the  level of bilateral brachial, anterior tibial, and posterior tibial arteries, when vessel segments are accessible. Bilateral testing is considered an integral part of a complete examination. Photoelectric Plethysmograph (PPG) waveforms and toe systolic pressure readings are included as required and additional duplex testing as needed. Limited examinations for reoccurring indications may be performed as noted.  ABI Findings: +---------+------------------+-----+-----------+--------+ Right    Rt Pressure (mmHg)IndexWaveform   Comment  +---------+------------------+-----+-----------+--------+ Brachial 116                    triphasic           +---------+------------------+-----+-----------+--------+ PTA      252               2.15 multiphasic         +---------+------------------+-----+-----------+--------+ DP       254               2.17 multiphasic         +---------+------------------+-----+-----------+--------+ Great Toe                                  Wound    +---------+------------------+-----+-----------+--------+ +---------+------------------+-----+-----------+-------+ Left     Lt Pressure (mmHg)IndexWaveform  Comment +---------+------------------+-----+-----------+-------+ Brachial 117                    triphasic          +---------+------------------+-----+-----------+-------+ PTA      254               2.17 multiphasic        +---------+------------------+-----+-----------+-------+ DP       254               2.17 multiphasic        +---------+------------------+-----+-----------+-------+ Great Toe216               1.85                    +---------+------------------+-----+-----------+-------+ +-------+-----------+-----------+------------+------------+ ABI/TBIToday's ABIToday's TBIPrevious ABIPrevious TBI +-------+-----------+-----------+------------+------------+ Right  [place]                                              +-------+-----------+-----------+------------+------------+ Left   [place]         [place]                                  +-------+-----------+-----------+------------+------------+  Summary: Right: Resting right ankle-brachial index indicates noncompressible right lower extremity arteries. Unable to obtain TBI due to great toe open wound/bandages. Left: Resting left ankle-brachial index indicates noncompressible left lower extremity arteries. The left toe-brachial index is abnormal. *See table(s) above for measurements and observations.     Preliminary    DG Abd 1 View  Result Date: 03/29/2023 CLINICAL DATA:  Abdominal pain. EXAM: ABDOMEN - 1 VIEW COMPARISON:  CT 03/26/2023 FINDINGS: Bilateral ureteral stents in place. IVC filter in place. Prior abdominal hernia repair with tacks. No bowel dilatation or obstruction. Small volume of stool in the colon. IMPRESSION: Normal bowel gas pattern. Bilateral ureteral stents in place. Electronically Signed   By: Narda Rutherford M.D.   On: 03/29/2023 21:54    Scheduled Meds:  Chlorhexidine Gluconate Cloth  6 each Topical Daily   fentaNYL  1 patch Transdermal Q72H   gabapentin  300 mg Oral QHS   insulin aspart  0-5 Units Subcutaneous QHS   insulin aspart  0-6 Units Subcutaneous TID WC   insulin detemir  5 Units Subcutaneous BID   LORazepam  0.5 mg Oral BID   oxybutynin  10 mg Oral Daily   senna  1 tablet Oral QHS   sodium chloride flush  10-40 mL Intracatheter Q12H   sodium chloride flush  3 mL Intravenous Q12H   [START ON 04/01/2023] venlafaxine XR  75 mg Oral Q breakfast   Continuous Infusions:  heparin 1,200 Units/hr (03/31/23 0002)    LOS: 4 days   Marguerita Merles, DO [facility] Available via Epic secure chat 7am-7pm After these hours, please refer to coverage provider listed on [email or web address] 03/31/2023, 4:48 PM

## 2023-03-31 NOTE — Plan of Care (Signed)
  Problem: Clinical Measurements: Goal: Respiratory complications will improve Outcome: Progressing   Problem: Activity: Goal: Risk for activity intolerance will decrease Outcome: Progressing   Problem: Nutrition: Goal: Adequate nutrition will be maintained Outcome: Progressing   Problem: Coping: Goal: Level of anxiety will decrease Outcome: Progressing   Problem: Pain Managment: Goal: General experience of comfort will improve Outcome: Progressing   

## 2023-03-31 NOTE — Consult Note (Signed)
[facility] [place]  ASSESSMENT / PLAN: 57 y.o. female with right great toe ulceration. Reassuring physical exam and non-invasive testing. Suspect she will heal without intervention. Please call for any questions.  CHIEF COMPLAINT: right great toe ulcer  HISTORY OF PRESENT ILLNESS: Jaime Gomez is a 57 y.o. female with metastatic endometrial cancer, DM2, CKD IV, history of DVT/PT admitting to the hospital with fialure to thrive and pain. She was found to have a new ulcer on her right toe. She has a right tibial fracture that requires surgical repair. ABI was performed and showed non-compressible tibial vessels with multiphasic waveforms. MRI showed no evidence of osteomyelitis. The ulcer is moderately painful to her.    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/2021   Ovarian cancer (HCC) 2013   Pneumonia 12/03/2022   Stroke (HCC)    Wears glasses     Past Surgical History:  Procedure Laterality Date   ABDOMINAL HYSTERECTOMY     BOTOX INJECTION N/A 01/12/2023   Procedure: BOTOX INJECTION INTO BLADDER;  Surgeon: Jannifer Hick, MD;  Location: [facility];  Service: Urology;  Laterality: N/A;   CYSTOSCOPY W/ RETROGRADES Right 01/12/2023   Procedure: CYSTOSCOPY WITH RIGHT  RETROGRADE PYELOGRAM/ RIGHT URETERAL STENT CHANGE;  Surgeon: Jannifer Hick, MD;  Location: [facility];  Service: Urology;  Laterality: Right;   CYSTOSCOPY W/ URETERAL STENT PLACEMENT Bilateral 09/01/2021   Procedure: CYSTOSCOPY WITH RETROGRADE PYELOGRAM/URETERAL STENT PLACEMENT;  Surgeon: Jannifer Hick, MD;  Location: [facility];  Service: Urology;  Laterality: Bilateral;   CYSTOSCOPY WITH FULGERATION N/A 01/12/2023   Procedure: CYSTOSCOPY WITH FULGERATION;  Surgeon: Jannifer Hick, MD;  Location: [facility];  Service: Urology;  Laterality: N/A;   CYSTOSCOPY WITH STENT PLACEMENT Right 05/19/2022   Procedure: CYSTOSCOPY WITH STENT CHANGE;  Surgeon: Jannifer Hick, MD;  Location: [facility];  Service: Urology;  Laterality: Right;  ONLY NEEDS 30 MIN   HERNIA REPAIR     IR CONVERT LEFT NEPHROSTOMY TO NEPHROURETERAL CATH  11/02/2021   IR CONVERT LEFT NEPHROSTOMY TO NEPHROURETERAL CATH  01/09/2023   IR CV LINE INJECTION  08/23/2021   IR DIL URETER LEFT  01/09/2023   IR IMAGING GUIDED PORT INSERTION  11/14/2021   IR IMAGING GUIDED PORT INSERTION  01/09/2023   IR NEPHROSTOMY EXCHANGE LEFT  11/02/2021   IR NEPHROSTOMY EXCHANGE LEFT  01/20/2022   IR NEPHROSTOMY EXCHANGE LEFT  11/29/2022   IR NEPHROSTOMY PLACEMENT LEFT  09/03/2021   IR REMOVAL TUN ACCESS W/ PORT W/O FL MOD SED  11/14/2021   IR REMOVAL TUN ACCESS W/ PORT W/O FL MOD SED  12/04/2022   IR URETERAL STENT PLACEMENT EXISTING ACCESS LEFT  02/28/2023    Family History  Problem Relation Age of Onset   Hyperlipidemia Mother    Heart disease Mother    Diabetes Mother    Depression Mother    COPD Mother    Arthritis Mother    Heart attack Mother    Drug abuse Father    Alcohol abuse Father  Hyperlipidemia Sister    Heart disease Sister    Diabetes Sister    Arthritis Sister    Miscarriages / [place] Sister    Depression Brother     Social History   Socioeconomic History   Marital status: Single    Spouse name: Not on file   Number of children: Not on file   Years of education: Not on file   Highest education level: Not on file  Occupational History   Not on file  Tobacco Use   Smoking status: Never   Smokeless tobacco: Never  Vaping Use   Vaping status: Never Used  Substance and Sexual Activity   Alcohol  use: Never   Drug use: Never   Sexual activity: Not Currently    Birth control/protection: Surgical  Other Topics Concern   Not on file  Social History Narrative   Not on file   Social Determinants of Health   Financial Resource Strain: Low Risk  (06/27/2022)   Overall Financial Resource Strain (CARDIA)    Difficulty of Paying Living Expenses: Not hard at all  Food Insecurity: No Food Insecurity (03/27/2023)   Hunger Vital Sign    Worried About Running Out of Food in the Last Year: Never true    Ran Out of Food in the Last Year: Never true  Transportation Needs: No Transportation Needs (03/27/2023)   PRAPARE - Administrator, Civil Service (Medical): No    Lack of Transportation (Non-Medical): No  Physical Activity: Not on file  Stress: No Stress Concern Present (06/27/2022)   [employer] of Occupational Health - Occupational Stress Questionnaire    Feeling of Stress : Not at all  Social Connections: Unknown (12/06/2021)   [facility] from [facility]   Social Network    Social Network: Not on file  Intimate Partner Violence: Not At Risk (03/27/2023)   Humiliation, Afraid, Rape, and Kick questionnaire    Fear of Current or Ex-Partner: No    Emotionally Abused: No    Physically Abused: No    Sexually Abused: No    Allergies  Allergen Reactions   Paclitaxel Anaphylaxis, Swelling and Other (See Comments)    Made everything swell   Taxotere [Docetaxel] Anaphylaxis, Swelling and Other (See Comments)    Throat swelling    Current Facility-Administered Medications  Medication Dose Route Frequency Provider Last Rate Last Admin   acetaminophen (TYLENOL) tablet 650 mg  650 mg Oral Q6H PRN Opyd, Lavone Neri, MD   650 mg at 03/29/23 1511   Or   acetaminophen (TYLENOL) suppository 650 mg  650 mg Rectal Q6H PRN Opyd, Lavone Neri, MD       Chlorhexidine Gluconate Cloth 2 % PADS 6 each  6 each Topical Daily Marguerita Merles [place], DO   6 each at 03/31/23 0935    cyclobenzaprine (FLEXERIL) tablet 5 mg  5 mg Oral TID PRN Barbara Cower, NP       fentaNYL (DURAGESIC) 12 MCG/HR 1 patch  1 patch Transdermal Q72H [name], MD   1 patch at 03/31/23 0933   gabapentin (NEURONTIN) capsule 300 mg  300 mg Oral QHS Opyd, Lavone Neri, MD   300 mg at 03/30/23 2154   heparin ADULT infusion 100 units/mL (25000 units/214mL)  1,200 Units/hr Intravenous Continuous Shade, Christine E, RPH 12 mL/hr at 03/31/23 0002 1,200 Units/hr at 03/31/23 0002   HYDROmorphone (DILAUDID) injection 0.5 mg  0.5 mg Intravenous Q3H PRN Barbara Cower, NP  0.5 mg at 03/30/23 2154   insulin aspart (novoLOG) injection 0-5 Units  0-5 Units Subcutaneous QHS Briscoe Deutscher, MD   3 Units at 03/29/23 2223   insulin aspart (novoLOG) injection 0-6 Units  0-6 Units Subcutaneous TID WC Opyd, Lavone Neri, MD   1 Units at 03/31/23 0931   insulin detemir (LEVEMIR) injection 5 Units  5 Units Subcutaneous BID Marguerita Merles [place], DO   5 Units at 03/31/23 0935   ipratropium-albuterol (DUONEB) 0.5-2.5 (3) MG/3ML nebulizer solution 3 mL  3 mL Nebulization Q4H PRN Luiz Iron, NP   3 mL at 03/30/23 1203   lidocaine-prilocaine (EMLA) cream   Topical PRN Marguerita Merles Latif, DO       morphine (morphine sulfate) injection for inhalation 10 mg  10 mg Inhalation Q2H PRN Barbara Cower, NP       ondansetron (ZOFRAN) tablet 4 mg  4 mg Oral Q6H PRN Opyd, Lavone Neri, MD       Or   ondansetron (ZOFRAN) injection 4 mg  4 mg Intravenous Q6H PRN Opyd, Lavone Neri, MD       oxybutynin (DITROPAN-XL) 24 hr tablet 10 mg  10 mg Oral Daily Opyd, Lavone Neri, MD   10 mg at 03/31/23 0931   oxyCODONE (Oxy IR/ROXICODONE) immediate release tablet 5 mg  5 mg Oral Q4H PRN Opyd, Lavone Neri, MD   5 mg at 03/31/23 0417   senna (SENOKOT) tablet 8.6 mg  1 tablet Oral QHS Mahan, Vicie Mutters, NP       senna-docusate (Senokot-S) tablet 1 tablet  1 tablet Oral QHS PRN Opyd, Lavone Neri, MD       sodium chloride flush (NS) 0.9 % injection 10-40 mL   10-40 mL Intracatheter Q12H Sheikh, Omair [place], DO   10 mL at 03/28/23 2324   sodium chloride flush (NS) 0.9 % injection 10-40 mL  10-40 mL Intracatheter PRN Sheikh, Omair Latif, DO       sodium chloride flush (NS) 0.9 % injection 3 mL  3 mL Intravenous Q12H Opyd, Lavone Neri, MD   3 mL at 03/30/23 1010   venlafaxine XR (EFFEXOR-XR) 24 hr capsule 37.5 mg  37.5 mg Oral Q breakfast Opyd, Lavone Neri, MD   37.5 mg at 03/31/23 5784   Facility-Administered Medications Ordered in Other Encounters  Medication Dose Route Frequency Provider Last Rate Last Admin   0.9 %  sodium chloride infusion   Intravenous Once Erenest Blank, NP        PHYSICAL EXAM Vitals:   03/30/23 1724 03/30/23 2019 03/31/23 0423 03/31/23 0500  BP: 92/67 105/75 137/87   Pulse:  89 87   Resp:   15   Temp:  98.5 F (36.9 C) 98.7 F (37.1 C)   TempSrc:  Oral Oral   SpO2:  (!) 89% 90%   Weight:    71.5 kg  Height:       No distress Regular rate and rhythm Unlabored breathing 2+ DP pulses bilaterally Small dorsal great toe ulcer measuring about 2mm. It appears superficial and confined to the skin/soft tissue.   PERTINENT LABORATORY AND RADIOLOGIC DATA  Most recent CBC    Latest Ref Rng & Units 03/30/2023    3:53 AM 03/29/2023    4:10 AM 03/28/2023    4:18 AM  CBC  WBC 4.0 - 10.5 K/uL 6.1  6.6  4.3   Hemoglobin 12.0 - 15.0 g/dL 9.7  8.9  7.2   Hematocrit 36.0 - 46.0 %  31.3  27.2  23.2   Platelets 150 - 400 K/uL 195  187  182      Most recent CMP    Latest Ref Rng & Units 03/30/2023    3:53 AM 03/29/2023    4:10 AM 03/28/2023    4:18 AM  CMP  Glucose 70 - 99 mg/dL 64  69  130   BUN 6 - 20 mg/dL 77  84  93   Creatinine 0.44 - 1.00 mg/dL 8.65  7.84  6.96   Sodium 135 - 145 mmol/L 136  137  135   Potassium 3.5 - 5.1 mmol/L 5.0  3.4  4.3   Chloride 98 - 111 mmol/L 100  101  103   CO2 22 - 32 mmol/L 22  21  19    Calcium 8.9 - 10.3 mg/dL 7.9  7.8  7.9   Total Protein 6.5 - 8.1 g/dL 6.3  5.8    Total Bilirubin 0.3 -  1.2 mg/dL 0.5  0.5    Alkaline Phos 38 - 126 U/L 111  93    AST 15 - 41 U/L 36  41    ALT 0 - 44 U/L 24  24      Renal function Estimated Creatinine Clearance: 14.1 mL/min (A) (by C-G formula based on SCr of 4.23 mg/dL (H)).  HbA1c POC (<> result, manual entry) (%)  Date Value  12/26/2022 6.2   Hgb A1c MFr Bld (%)  Date Value  03/20/2023 7.7 (H)    LDL Cholesterol  Date Value Ref Range Status  12/04/2022 93 0 - 99 mg/dL Final    Comment:           Total Cholesterol/HDL:CHD Risk Coronary Heart Disease Risk Table                     Men   Women  1/2 Average Risk   3.4   3.3  Average Risk       5.0   4.4  2 X Average Risk   9.6   7.1  3 X Average Risk  23.4   11.0        Use the calculated Patient Ratio above and the CHD Risk Table to determine the patient's CHD Risk.        ATP III CLASSIFICATION (LDL):  <100     mg/dL   Optimal  295-284  mg/dL   Near or Above                    Optimal  130-159  mg/dL   Borderline  132-440  mg/dL   High  >102     mg/dL   Very High Performed at [facility], [address], [place], [place] [address]     Rande Brunt. Lenell Antu, MD FACS [facility] Phone Number: [phone] 03/31/2023 11:13 AM   Total time spent on preparing this encounter including chart review, data review, collecting history, examining the patient, coordinating care for this new patient, 45 minutes.  Portions of this report may have been transcribed using voice recognition software.  Every effort has been made to ensure accuracy; however, inadvertent computerized transcription errors may still be present.

## 2023-03-31 NOTE — Progress Notes (Signed)
ANTICOAGULATION CONSULT NOTE   Pharmacy Consult for IV heparin Indication: pulmonary embolus/DVT (LM[facility] on hold)  Allergies  Allergen Reactions   Paclitaxel Anaphylaxis, Swelling and Other (See Comments)    Made everything swell   Taxotere [Docetaxel] Anaphylaxis, Swelling and Other (See Comments)    Throat swelling    Patient Measurements: Height: 5\' 4"  (162.6 cm) Weight: 71.5 kg (157 lb 10.1 oz) IBW/kg (Calculated) : 54.7 Heparin Dosing Weight: 69 kg  Vital Signs: Temp: 98.7 F (37.1 C) (09/07 0423) Temp Source: Oral (09/07 0423) BP: 137/87 (09/07 0423) Pulse Rate: 87 (09/07 0423)  Labs: Recent Labs    03/29/23 0410 03/30/23 0353 03/31/23 0835  HGB 8.9* 9.7*  --   HCT 27.2* 31.3*  --   PLT 187 195  --   HEPARINUNFRC  --   --  0.53  CREATININE 4.39* 4.23*  --   CKTOTAL 803* 416*  --     Estimated Creatinine Clearance: 14.1 mL/min (A) (by C-G formula based on SCr of 4.23 mg/dL (H)).  Medications: Therapeutic LMWH chronically  Assessment: Pharmacy is consulted to dose heparin drip on 57 yo female with PMH of PE/DVT. Pt chronically anticoagulated with enoxaparin 60 mg subQ daily PTA (~1 mg/kg). Hx CKD but AKI on admission.   Planning for ortho surgery on 9/10. Anticoagulation transitioned to UFH for peri-procedural anticoagulation. Last dose of enoxaparin on 9/5 at 2222.    Today, 03/31/23 Heparin level = 0.53 is therapeutic on heparin infusion of 1200 units/hr CBC: Pending, has not been drawn yet Confirmed with RN - no signs of bleeding CrCl remains < 30 mL/min  Goal of Therapy:  Heparin level 0.3-0.7 units/ml Monitor platelets by anticoagulation protocol: Yes   Plan:  Continue heparin infusion at 1200 units/hr Check confirmatory 8 hour heparin level CBC, heparin level daily Monitor for signs of bleeding  Will await orders from ortho regarding when to hold heparin drip prior to surgery.   Cindi Carbon, PharmD 03/31/23 10:14 AM

## 2023-03-31 NOTE — Progress Notes (Signed)
ANTICOAGULATION CONSULT NOTE   Pharmacy Consult for IV heparin Indication: pulmonary embolus/DVT ([facility] on hold)  Allergies  Allergen Reactions   Paclitaxel Anaphylaxis, Swelling and Other (See Comments)    Made everything swell   Taxotere [Docetaxel] Anaphylaxis, Swelling and Other (See Comments)    Throat swelling    Patient Measurements: Height: 5\' 4"  (162.6 cm) Weight: 71.5 kg (157 lb 10.1 oz) IBW/kg (Calculated) : 54.7 Heparin Dosing Weight: 69 kg  Vital Signs: Temp: 99.1 F (37.3 C) (09/07 1449) Temp Source: Oral (09/07 1449) BP: 117/80 (09/07 1449) Pulse Rate: 101 (09/07 1449)  Labs: Recent Labs    03/29/23 0410 03/30/23 0353 03/31/23 0835 03/31/23 1050 03/31/23 1902  HGB 8.9* 9.7*  --  9.1*  --   HCT 27.2* 31.3*  --  29.5*  --   PLT 187 195  --  181  --   HEPARINUNFRC  --   --  0.53  --  0.23*  CREATININE 4.39* 4.23*  --  3.81*  --   CKTOTAL 803* 416*  --   --   --     Estimated Creatinine Clearance: 15.6 mL/min (A) (by C-G formula based on SCr of 3.81 mg/dL (H)).  Medications: Therapeutic LMWH chronically  Assessment: Pharmacy is consulted to dose heparin drip on 57 yo female with PMH of PE/DVT. Pt chronically anticoagulated with enoxaparin 60 mg subQ daily PTA (~1 mg/kg). Hx CKD but AKI on admission.   Planning for ortho surgery on 9/10. Anticoagulation transitioned to UFH for peri-procedural anticoagulation. Last dose of enoxaparin on 9/5 at 2222.    Today, 03/31/23 Heparin level = 0.23 is subtherapeutic on heparin infusion of 1200 units/hr CBC: Hgb low at 9.1 but stable, plt 181  Confirmed with RN - no signs of bleeding CrCl remains < 30 mL/min  Goal of Therapy:  Heparin level 0.3-0.7 units/ml Monitor platelets by anticoagulation protocol: Yes   Plan:  Increase  heparin infusion to  1300 units/hr Check confirmatory 8 hour heparin level CBC, heparin level daily Monitor for signs of bleeding  Will await orders from ortho regarding when to  hold heparin drip prior to surgery.    Adalberto Cole, PharmD, BCPS 03/31/2023 8:13 PM

## 2023-04-01 DIAGNOSIS — N184 Chronic kidney disease, stage 4 (severe): Secondary | ICD-10-CM | POA: Diagnosis not present

## 2023-04-01 DIAGNOSIS — Z7189 Other specified counseling: Secondary | ICD-10-CM | POA: Diagnosis not present

## 2023-04-01 DIAGNOSIS — C541 Malignant neoplasm of endometrium: Secondary | ICD-10-CM | POA: Diagnosis not present

## 2023-04-01 DIAGNOSIS — N179 Acute kidney failure, unspecified: Secondary | ICD-10-CM | POA: Diagnosis not present

## 2023-04-01 DIAGNOSIS — Z515 Encounter for palliative care: Secondary | ICD-10-CM | POA: Diagnosis not present

## 2023-04-01 LAB — COMPREHENSIVE METABOLIC PANEL
ALT: 16 U/L (ref 0–44)
AST: 14 U/L — ABNORMAL LOW (ref 15–41)
Albumin: 2.1 g/dL — ABNORMAL LOW (ref 3.5–5.0)
Alkaline Phosphatase: 95 U/L (ref 38–126)
Anion gap: 14 (ref 5–15)
BUN: 74 mg/dL — ABNORMAL HIGH (ref 6–20)
CO2: 22 mmol/L (ref 22–32)
Calcium: 8.8 mg/dL — ABNORMAL LOW (ref 8.9–10.3)
Chloride: 103 mmol/L (ref 98–111)
Creatinine, Ser: 3.98 mg/dL — ABNORMAL HIGH (ref 0.44–1.00)
GFR, Estimated: 12 mL/min — ABNORMAL LOW (ref >60)
Glucose, Bld: 126 mg/dL — ABNORMAL HIGH (ref 70–99)
Potassium: 5.2 mmol/L — ABNORMAL HIGH (ref 3.5–5.1)
Sodium: 139 mmol/L (ref 135–145)
Total Bilirubin: 0.4 mg/dL (ref 0.3–1.2)
Total Protein: 5.7 g/dL — ABNORMAL LOW (ref 6.5–8.1)

## 2023-04-01 LAB — HEPARIN LEVEL (UNFRACTIONATED)
Heparin Unfractionated: 0.29 [IU]/mL — ABNORMAL LOW (ref 0.30–0.70)
Heparin Unfractionated: 0.35 [IU]/mL (ref 0.30–0.70)

## 2023-04-01 LAB — GLUCOSE, CAPILLARY
Glucose-Capillary: 104 mg/dL — ABNORMAL HIGH (ref 70–99)
Glucose-Capillary: 193 mg/dL — ABNORMAL HIGH (ref 70–99)
Glucose-Capillary: 125 mg/dL — ABNORMAL HIGH (ref 70–99)
Glucose-Capillary: 160 mg/dL — ABNORMAL HIGH (ref 70–99)

## 2023-04-01 LAB — CBC WITH DIFFERENTIAL/PLATELET
Abs Immature Granulocytes: 0.13 10*3/uL — ABNORMAL HIGH (ref 0.00–0.07)
Basophils Absolute: 0 10*3/uL (ref 0.0–0.1)
Basophils Relative: 1 %
Eosinophils Absolute: 0.1 10*3/uL (ref 0.0–0.5)
Eosinophils Relative: 2 %
HCT: 28.9 % — ABNORMAL LOW (ref 36.0–46.0)
Hemoglobin: 8.7 g/dL — ABNORMAL LOW (ref 12.0–15.0)
Immature Granulocytes: 2 %
Lymphocytes Relative: 9 %
Lymphs Abs: 0.6 10*3/uL — ABNORMAL LOW (ref 0.7–4.0)
MCH: 27.8 pg (ref 26.0–34.0)
MCHC: 30.1 g/dL (ref 30.0–36.0)
MCV: 92.3 fL (ref 80.0–100.0)
Monocytes Absolute: 0.6 10*3/uL (ref 0.1–1.0)
Monocytes Relative: 10 %
Neutro Abs: 5.1 10*3/uL (ref 1.7–7.7)
Neutrophils Relative %: 76 %
Platelets: 155 10*3/uL (ref 150–400)
RBC: 3.13 MIL/uL — ABNORMAL LOW (ref 3.87–5.11)
RDW: 18.6 % — ABNORMAL HIGH (ref 11.5–15.5)
WBC: 6.5 10*3/uL (ref 4.0–10.5)
nRBC: 0 % (ref 0.0–0.2)

## 2023-04-01 LAB — CULTURE, BLOOD (ROUTINE X 2)
Culture: NO GROWTH
Culture: NO GROWTH
Special Requests: ADEQUATE
Special Requests: ADEQUATE

## 2023-04-01 LAB — CK: Total CK: 82 U/L (ref 38–234)

## 2023-04-01 LAB — MAGNESIUM: Magnesium: 1.9 mg/dL (ref 1.7–2.4)

## 2023-04-01 LAB — PHOSPHORUS: Phosphorus: 5.7 mg/dL — ABNORMAL HIGH (ref 2.5–4.6)

## 2023-04-01 MED ORDER — LIDOCAINE 5 % EX PTCH
2.0000 | MEDICATED_PATCH | CUTANEOUS | Status: DC
  Administered 2023-04-01 – 2023-04-07 (×7): 2 via TRANSDERMAL
  Filled 2023-04-01 (×7): qty 2

## 2023-04-01 MED ORDER — SODIUM ZIRCONIUM CYCLOSILICATE 10 G PO PACK
10.0000 g | PACK | Freq: Every day | ORAL | Status: DC
  Administered 2023-04-01: 10 g via ORAL
  Filled 2023-04-01: qty 1

## 2023-04-01 NOTE — Progress Notes (Signed)
ANTICOAGULATION CONSULT NOTE   Pharmacy Consult for IV heparin Indication: pulmonary embolus/DVT (LMWH on hold)  Allergies  Allergen Reactions   Paclitaxel Anaphylaxis, Swelling and Other (See Comments)    Made everything swell   Taxotere [Docetaxel] Anaphylaxis, Swelling and Other (See Comments)    Throat swelling    Patient Measurements: Height: 5\' 4"  (162.6 cm) Weight: 69.3 kg (152 lb 12.5 oz) IBW/kg (Calculated) : 54.7 Heparin Dosing Weight: n/a. Use total body weight  Vital Signs: Temp: 98.2 F (36.8 C) (09/08 0917) Temp Source: Oral (09/08 0917) BP: 129/82 (09/08 0917) Pulse Rate: 93 (09/08 0917)  Labs: Recent Labs    03/30/23 0353 03/31/23 0835 03/31/23 1050 03/31/23 1902 04/01/23 0459  HGB 9.7*  --  9.1*  --  8.7*  HCT 31.3*  --  29.5*  --  28.9*  PLT 195  --  181  --  155  HEPARINUNFRC  --  0.53  --  0.23* 0.29*  CREATININE 4.23*  --  3.81*  --  3.98*  CKTOTAL 416*  --   --   --  82    Estimated Creatinine Clearance: 14.7 mL/min (A) (by C-G formula based on SCr of 3.98 mg/dL (H)).  Medications: Therapeutic LMWH chronically  Assessment: Pharmacy is consulted to dose heparin drip on 57 yo female with PMH of PE/DVT. Pt chronically anticoagulated with enoxaparin 60 mg subQ daily PTA (~1 mg/kg). Hx CKD but AKI on admission.   Planning for ortho surgery on 9/10. Anticoagulation transitioned to UFH for peri-procedural anticoagulation. Last dose of enoxaparin on 9/5 at 2222.    Today, 04/01/23 Heparin level = 0.35 is therapeutic on heparin infusion of 1400 units/hr CBC: Hgb low but stable, Plt WNL Confirmed with RN - no signs of bleeding or interruptions/line issues CrCl remains < 30 mL/min  Goal of Therapy:  Heparin level 0.3-0.7 units/ml Monitor platelets by anticoagulation protocol: Yes   Plan:  Continue heparin infusion at 1400 units/hr Check confirmatory 8 hour heparin level CBC, heparin level daily Monitor for signs of bleeding  Will await  orders from ortho regarding when to hold heparin drip prior to surgery.   Cindi Carbon, PharmD 04/01/23 11:01 AM

## 2023-04-01 NOTE — Progress Notes (Signed)
Palliative:  HPI:  57 y.o. 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  Plans for chemotherapy have been on hold due to hospitalizations and to await surgery. Admitted for acute on chronic renal injury and worsening dypnea, and anemia. Palliative medicine consulted for GOC.     I met today with Burnita with twin sister, [name], at bedside. Mckaylah is up in recliner and worked with PT this morning. They both report that this went well. We reviewed pain medication efficacy and she reports very well controlled pain. They do report some times of confusion and grogginess with pain regimen but they are both willing to accept these consequences to have continued pain relief as this is the main goal. No changes to symptom regimen at this time. Some straining with BM yesterday but noted her first dose of senokot was last night - monitor for increased support to keep bowels moving.   They are hopeful to discuss with surgeon potential for surgery risks vs benefits. Potential for surgery on Tuesday if they agree. We did discuss her goal to return home where she lives with her sister and she has an aide during the part of the days when sister is working. We did discuss the potential of hospice support at home as this aligns with their goals and priorities. They seem open to hospice support. We plan to discuss more after final decision made for surgery or not. We also discuss pet visitation policy as Mckenley would very much like to see her dog Toney Reil - [name] will likely plan to bring [facility] for visit. I discussed with nurse as well.   All questions/concerns addressed. Emotional support provided.   Exam: Alert, oriented. Sleepy. No distress. Generalized weakness and fatigue. Breathing  regular, unlabored at rest with oxygen. Abd soft.   Plan: - DNR in place. - No changes to symptom management regimen.  - Hoping to speak with surgeon further before deciding for or against surgical intervention.  - Likely home with hospice support.   45 min  Yong Channel, NP Palliative Medicine Team Pager [phone] (Please see [email or web address] for schedule) Team Phone [phone]    Greater than 50%  of this time was spent counseling and coordinating care related to the above assessment and plan

## 2023-04-01 NOTE — Progress Notes (Signed)
Physical Therapy Treatment Patient Details Name: Jaime Gomez MRN: [id number] DOB: 1965/12/02 Today's Date: 04/01/2023   History of Present Illness 57 year old Caucasian female with past medical history significant for but not limited to hypertension, insulin-dependent diabetes mellitus type 2, depression, malignant hydronephrosis with right ureteral stent, CKD stage IV, endometrial cancer with metastasis, history of PE and DVT on anticoagulants enoxaparin as well as other comorbidities who presented with progressive generalized weakness, fatigue and pain.  She is having progressive dyspnea and likely due to her pulmonary metastasis and was recently started on supplemental oxygen.    PT Comments  Vestibular evaluation acknowledged however do not feel pt can tolerate/participate at this time.  Pt reports constant dizziness/spinning at rest.  However, pt stated dizziness improved (but still present) with ambulating in room.  Pt assisted with ambulating short distance and was agreeable to remain OOB in recliner.  Pt on 4L O2 [place] at this time and SpO2 90% after ambulating.  Per reviewing chart, Palliative team following and appears pt is considering comfort care - hospice however had further questions about upcoming possible tibial surgery (please refer to palliative care note).  Will follow along, however please discontinue order if pt and family do not prefer therapy services pending GOC.    If plan is discharge home, recommend the following: A little help with walking and/or transfers;A little help with bathing/dressing/bathroom;Help with stairs or ramp for entrance;Assistance with cooking/housework   Can travel by private vehicle        Equipment Recommendations  None recommended by PT    Recommendations for Other Services       Precautions / Restrictions Precautions Precautions: Fall;Other (comment) Precaution Comments: She has a left tibia mestastic lesion given her outpatient workup and she  was to have surgical intervention for repair of this and now she was post to have surgical repair on the impending fracture on 03/27/2023 but was hospitalized.  Awaiting Ortho input but pt reports WBAT with walker prior to this admission.  Possible tibial surgery pending Tuesday 9/10     Mobility  Bed Mobility Overal bed mobility: Needs Assistance Bed Mobility: Supine to Sit     Supine to sit: Contact guard, HOB elevated     General bed mobility comments: VC to initiate task. No physical assist needed. pt reports constant dizziness; initiated testing horizontal canal with head turns and HOB elevated however pt with difficulty keeping eyes open despite cues and focused on her pain; reports dizziness remains constant    Transfers Overall transfer level: Needs assistance Equipment used: Rolling walker (2 wheels) Transfers: Sit to/from Stand Sit to Stand: Contact guard assist           General transfer comment: CGA assist provided for safety    Ambulation/Gait Ambulation/Gait assistance: Contact guard assist Gait Distance (Feet): 10 Feet Assistive device: Rolling walker (2 wheels) Gait Pattern/deviations: Step-through pattern, Decreased stride length, Narrow base of support       General Gait Details: small short steps, limited distance due to pain; SPO2 90% on 4L O2 [place] upon sitting in recliner; pt reports dizziness remains but improved with mobilizing   Stairs             Wheelchair Mobility     Tilt Bed    Modified Rankin (Stroke Patients Only)       Balance  Cognition Arousal: Obtunded, Suspect due to medications Behavior During Therapy: Lability                                   General Comments: sleeping on arrival, requiring cues for remain awake at first, emotionally labile once awake due to pain        Exercises      General Comments        Pertinent Vitals/Pain  Pain Assessment Pain Assessment: Faces Faces Pain Scale: Hurts even more Pain Location: "everywhere" Pain Descriptors / Indicators: Crying Pain Intervention(s): Repositioned, Monitored during session, Patient requesting pain meds-RN notified    Home Living                          Prior Function            PT Goals (current goals can now be found in the care plan section) Progress towards PT goals: Progressing toward goals    Frequency    Min 1X/week      PT Plan      Co-evaluation              AM-PAC PT "6 Clicks" Mobility   Outcome Measure  Help needed turning from your back to your side while in a flat bed without using bedrails?: A Little Help needed moving from lying on your back to sitting on the side of a flat bed without using bedrails?: A Little Help needed moving to and from a bed to a chair (including a wheelchair)?: A Little Help needed standing up from a chair using your arms (e.g., wheelchair or bedside chair)?: A Little Help needed to walk in hospital room?: A Little Help needed climbing 3-5 steps with a railing? : A Lot 6 Click Score: 17    End of Session Equipment Utilized During Treatment: Gait belt Activity Tolerance: Patient tolerated treatment well Patient left: in chair;with call bell/phone within reach;with chair alarm set;with family/visitor present Nurse Communication: Mobility status PT Visit Diagnosis: Difficulty in walking, not elsewhere classified (R26.2)     Time: 0981-1914 PT Time Calculation (min) (ACUTE ONLY): 21 min  Charges:    $Gait Training: 8-22 mins PT General Charges $$ ACUTE PT VISIT: 1 Visit                     Paulino Door, DPT Physical Therapist Acute Rehabilitation Services Office: [phone]    Kati L Payson 04/01/2023, 1:19 PM

## 2023-04-01 NOTE — Plan of Care (Signed)
  Problem: Health Behavior/Discharge Planning: Goal: Ability to manage health-related needs will improve Outcome: Progressing   Problem: Clinical Measurements: Goal: Will remain free from infection Outcome: Progressing Goal: Diagnostic test results will improve Outcome: Progressing   Problem: Activity: Goal: Risk for activity intolerance will decrease Outcome: Progressing   Problem: Coping: Goal: Level of anxiety will decrease Outcome: Progressing   Problem: Elimination: Goal: Will not experience complications related to urinary retention Outcome: Progressing   Problem: Pain Managment: Goal: General experience of comfort will improve Outcome: Progressing

## 2023-04-01 NOTE — Consult Note (Signed)
ORTHO  Spoke/met with patient and her sister. She has significant pain but also symptoms related to progression of her cancer like weakness and SOB. They are interested in palliative care and hospice. I reviewed what the surgery and recovery would look like and I think we have resolved to cancel case.  I recommend continued use of walker and assistance at all times when OOB. Unfortunately met is at level that I think both knee immobilizer and ankle boot wouldn't help and might hurt by creating stress riser.

## 2023-04-01 NOTE — Progress Notes (Signed)
ANTICOAGULATION CONSULT NOTE   Pharmacy Consult for IV heparin Indication: pulmonary embolus/DVT (LMWH on hold)  Allergies  Allergen Reactions   Paclitaxel Anaphylaxis, Swelling and Other (See Comments)    Made everything swell   Taxotere [Docetaxel] Anaphylaxis, Swelling and Other (See Comments)    Throat swelling    Patient Measurements: Height: 5\' 4"  (162.6 cm) Weight: 71.5 kg (157 lb 10.1 oz) IBW/kg (Calculated) : 54.7 Heparin Dosing Weight: 69 kg  Vital Signs: Temp: 98.6 F (37 C) (09/08 0045) Temp Source: Oral (09/08 0045) BP: 112/81 (09/08 0045) Pulse Rate: 99 (09/08 0045)  Labs: Recent Labs    03/30/23 0353 03/31/23 0835 03/31/23 1050 03/31/23 1902 04/01/23 0459  HGB 9.7*  --  9.1*  --  8.7*  HCT 31.3*  --  29.5*  --  28.9*  PLT 195  --  181  --  155  HEPARINUNFRC  --  0.53  --  0.23* 0.29*  CREATININE 4.23*  --  3.81*  --   --   CKTOTAL 416*  --   --   --   --     Estimated Creatinine Clearance: 15.6 mL/min (A) (by C-G formula based on SCr of 3.81 mg/dL (H)).  Medications: Therapeutic LMWH chronically  Assessment: Pharmacy is consulted to dose heparin drip on 57 yo female with PMH of PE/DVT. Pt chronically anticoagulated with enoxaparin 60 mg subQ daily PTA (~1 mg/kg). AonCKD noted.   Planning for ortho surgery on 9/10. Anticoagulation transitioned to UFH for peri-procedural anticoagulation. Last dose of enoxaparin on 9/5 at 2222.    Today, 04/01/23 Heparin level = 0.29 is subtherapeutic on heparin infusion of 1300 units/hr CBC: Hgb low at 8.7 but relatively stable since admission, plt WNL Confirmed with RN - no signs of bleeding or infusion related concerns CrCl remains < 30 mL/min  Goal of Therapy:  Heparin level 0.3-0.7 units/ml Monitor platelets by anticoagulation protocol: Yes   Plan:  Increase  heparin infusion to  1400 units/hr Check heparin level 8hrs after rate change CBC, heparin level daily Monitor for signs of bleeding Will await  orders from ortho regarding when to hold heparin drip prior to surgery.   Junita Push, PharmD, BCPS 04/01/2023 5:31 AM

## 2023-04-01 NOTE — Consult Note (Signed)
WOC consulted for toe ulcer, has been seen this admission for same, see wound care orders and work up per [facility]VS.   Will not re-consult at this time  [facility], CNS, CWON-AP [phone]

## 2023-04-01 NOTE — Progress Notes (Signed)
[facility] Progress Note Patient: Jaime Gomez WGN:[id number] DOB: 1965-11-09 DOA: 03/27/2023  DOS: the patient was seen and examined on 04/01/2023  Brief hospital course: PMH of type II DM, HTN, depression, hydronephrosis, CKD 4, endometrial cancer with metastasis, PE on Lovenox presented to hospital with complaints of worsening abdominal pain as well as leg pain. Found to have anemia and received a blood transfusion. Oncology following.  Palliative care was following. Currently goal is for pain control with most likely transition to hospice on discharge.  Assessment and Plan: AKI on CKD 4. Nontraumatic mild rhabdomyolysis in the setting of fall Metabolic acidosis Hyperkalemia Treated with bicarb. Received blood transfusion as well. Serum creatinine now stabilizing. CK was around 3700 and now improving. Receiving Lokelma for hyperkalemia. Monitor for now.  Endometrial Cancer with Metastases Has received multiple different treatments under the care of Dr. Myna Hidalgo, Multiple metastasis with ongoing pain. DNR/DNI.  Most likely plan for going home with hospice. Highly appreciate palliative care assistance.   Hx of DVT and PE  On IV heparin for now. Will transition back to Lovenox tomorrow.   Insulin-dependent DM with associated hypoglycemia A1c was 7.7% in August 2024.  Monitor on sliding scale insulin.   Hypertension  Was hypotensive. Blood pressure medication on hold. Monitor.   Depression and Anxiety Continue with venlafaxine XR 37.5 mg p.o. daily with breakfast  Recent Enterococcus UTI Pansensitive  Continue antibiotic.  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 Original plan was surgical intervention on 9/10. After discussion with orthopedics family has decided to hold off on  surgery. Continue pain control.  Right toe ulcer and wound MRI Foot no evidence of infection. Continue topical care. Vascular surgery recommended conservative measures.   Subjective: Reports headache.  No nausea no vomiting no fever no chills.  Oral intake adequate.  Physical Exam: General: in Mild distress, No Rash Cardiovascular: S1 and S2 Present, No Murmur Respiratory: Good respiratory effort, Bilateral Air entry present. No Crackles, No wheezes Abdomen: Bowel Sound present, No tenderness Extremities: No edema Neuro: Alert and oriented x3, no new focal deficit  Data Reviewed: I have Reviewed nursing notes, Vitals, and Lab results. Since last encounter, pertinent lab results CBC and BMP   . I have ordered test including CBC and BMP  . I have discussed pt's care plan and test results with orthopedics and palliative care  .   Disposition: Status is: Inpatient Remains inpatient appropriate because: Pain control.   Family Communication: Sister at bedside Level of care: Progressive   Vitals:   04/01/23 0500 04/01/23 0547 04/01/23 0917 04/01/23 1525  BP:  132/88 129/82 111/73  Pulse:  90 93 92  Resp:  18 (!) 22 (!) 22  Temp:  (!) 97.5 F (36.4 C) 98.2 F (36.8 C) 98.4 F (36.9 C)  TempSrc:  Oral Oral Oral  SpO2:  98% 97% 99%  Weight: 69.3 kg     Height:         Author: Lynden Oxford, MD 04/01/2023 7:24 PM  Please look on [email or web address] to find out who is on call.

## 2023-04-02 ENCOUNTER — Encounter (HOSPITAL_COMMUNITY): Payer: Self-pay | Admitting: Certified Registered"

## 2023-04-02 ENCOUNTER — Inpatient Hospital Stay (HOSPITAL_COMMUNITY): Payer: 59

## 2023-04-02 DIAGNOSIS — Z515 Encounter for palliative care: Secondary | ICD-10-CM | POA: Diagnosis not present

## 2023-04-02 DIAGNOSIS — C541 Malignant neoplasm of endometrium: Secondary | ICD-10-CM | POA: Diagnosis not present

## 2023-04-02 DIAGNOSIS — N184 Chronic kidney disease, stage 4 (severe): Secondary | ICD-10-CM | POA: Diagnosis not present

## 2023-04-02 DIAGNOSIS — Z7189 Other specified counseling: Secondary | ICD-10-CM | POA: Diagnosis not present

## 2023-04-02 DIAGNOSIS — N171 Acute kidney failure with acute cortical necrosis: Secondary | ICD-10-CM | POA: Diagnosis not present

## 2023-04-02 LAB — COMPREHENSIVE METABOLIC PANEL
ALT: 16 U/L (ref 0–44)
AST: 15 U/L (ref 15–41)
Albumin: 2.1 g/dL — ABNORMAL LOW (ref 3.5–5.0)
Alkaline Phosphatase: 87 U/L (ref 38–126)
Anion gap: 11 (ref 5–15)
BUN: 74 mg/dL — ABNORMAL HIGH (ref 6–20)
CO2: 23 mmol/L (ref 22–32)
Calcium: 8.3 mg/dL — ABNORMAL LOW (ref 8.9–10.3)
Chloride: 101 mmol/L (ref 98–111)
Creatinine, Ser: 3.85 mg/dL — ABNORMAL HIGH (ref 0.44–1.00)
GFR, Estimated: 13 mL/min — ABNORMAL LOW (ref >60)
Glucose, Bld: 136 mg/dL — ABNORMAL HIGH (ref 70–99)
Potassium: 4.5 mmol/L (ref 3.5–5.1)
Sodium: 135 mmol/L (ref 135–145)
Total Bilirubin: 0.5 mg/dL (ref 0.3–1.2)
Total Protein: 5.8 g/dL — ABNORMAL LOW (ref 6.5–8.1)

## 2023-04-02 LAB — CBC WITH DIFFERENTIAL/PLATELET
Abs Immature Granulocytes: 0.15 10*3/uL — ABNORMAL HIGH (ref 0.00–0.07)
Basophils Absolute: 0 10*3/uL (ref 0.0–0.1)
Basophils Relative: 0 %
Eosinophils Absolute: 0.2 10*3/uL (ref 0.0–0.5)
Eosinophils Relative: 3 %
HCT: 28.5 % — ABNORMAL LOW (ref 36.0–46.0)
Hemoglobin: 8.7 g/dL — ABNORMAL LOW (ref 12.0–15.0)
Immature Granulocytes: 3 %
Lymphocytes Relative: 8 %
Lymphs Abs: 0.4 10*3/uL — ABNORMAL LOW (ref 0.7–4.0)
MCH: 28.2 pg (ref 26.0–34.0)
MCHC: 30.5 g/dL (ref 30.0–36.0)
MCV: 92.2 fL (ref 80.0–100.0)
Monocytes Absolute: 0.5 10*3/uL (ref 0.1–1.0)
Monocytes Relative: 10 %
Neutro Abs: 4.3 10*3/uL (ref 1.7–7.7)
Neutrophils Relative %: 76 %
Platelets: 150 10*3/uL (ref 150–400)
RBC: 3.09 MIL/uL — ABNORMAL LOW (ref 3.87–5.11)
RDW: 18.3 % — ABNORMAL HIGH (ref 11.5–15.5)
WBC: 5.6 10*3/uL (ref 4.0–10.5)
nRBC: 0 % (ref 0.0–0.2)

## 2023-04-02 LAB — PREPARE RBC (CROSSMATCH)

## 2023-04-02 LAB — HEPARIN LEVEL (UNFRACTIONATED): Heparin Unfractionated: 0.35 [IU]/mL (ref 0.30–0.70)

## 2023-04-02 LAB — GLUCOSE, CAPILLARY
Glucose-Capillary: 114 mg/dL — ABNORMAL HIGH (ref 70–99)
Glucose-Capillary: 98 mg/dL (ref 70–99)
Glucose-Capillary: 104 mg/dL — ABNORMAL HIGH (ref 70–99)
Glucose-Capillary: 136 mg/dL — ABNORMAL HIGH (ref 70–99)

## 2023-04-02 MED ORDER — SODIUM CHLORIDE 0.9% IV SOLUTION: Freq: Once | INTRAVENOUS | Status: DC

## 2023-04-02 MED ORDER — LEVALBUTEROL HCL 0.63 MG/3ML IN NEBU
0.6300 mg | INHALATION_SOLUTION | Freq: Four times a day (QID) | RESPIRATORY_TRACT | Status: DC
  Administered 2023-04-03 (×4): 0.63 mg via RESPIRATORY_TRACT
  Filled 2023-04-02 (×5): qty 3

## 2023-04-02 MED ORDER — IPRATROPIUM BROMIDE 0.02 % IN SOLN
0.5000 mg | Freq: Four times a day (QID) | RESPIRATORY_TRACT | Status: DC
  Administered 2023-04-03 (×4): 0.5 mg via RESPIRATORY_TRACT
  Filled 2023-04-02 (×5): qty 2.5

## 2023-04-02 MED ORDER — FUROSEMIDE 10 MG/ML IJ SOLN: 20.0000 mg | Freq: Once | INTRAMUSCULAR | Status: DC

## 2023-04-02 MED ORDER — OXYCODONE HCL 5 MG PO TABS
10.0000 mg | ORAL_TABLET | ORAL | Status: DC | PRN
  Administered 2023-04-02 – 2023-04-08 (×7): 10 mg via ORAL
  Filled 2023-04-02 (×8): qty 2

## 2023-04-02 NOTE — Progress Notes (Signed)
Palliative:  HPI:  57 y.o. 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  Plans for chemotherapy have been on hold due to hospitalizations and to await surgery. Admitted for acute on chronic renal injury and worsening dypnea, and anemia. Palliative medicine consulted for GOC.   I met today with Jaime Gomez and sister/HCPOA, Jaime Gomez. We had a long discussion regarding steps forward. Jaime Gomez has a difficult time recalling any conversations that have been had. She does not recall her conversation with Dr. Myna Gomez this morning or with Dr. Jerl Gomez yesterday. We reviewed with her the conversations. We spent much time discussing her wishes for the future and her goals of care. Jaime Gomez has a difficult time consistently expressing her hopes. She does at times express a desire for chemotherapy or radiation but more often during conversation she tearfully shares that she just wants pain control. She is consistent with her wishes not to pursue surgery. She shares that she has been through so much already and "I'm done." Ultimately it is clear that most important to her is pain control and returning home to be with her dog, Jaime Gomez. We did speak about hospice and both Jaime Gomez and Jaime Gomez feel this will be a good option. Jaime Gomez often expresses the importance of trying to make everyone else happy with her decisions - we continue to reiterate that it is time for her to be selfish and do what is best for her. Jaime Gomez would like to meet with Dr. Myna Gomez in the morning to discuss further (coordinated with Dr. Myna Gomez who will meet her at bedside at [facility]). I will plan to follow up after their meeting in the morning with anticipating arrangements of hospice to be made.   All questions/concerns  addressed. Emotional support provided. Discussed with Dr. Allena Gomez and Dr. Myna Gomez.   Exam: Alert, fluctuating memory. Difficulty with word finding and expressing thoughts at times. Breathing regular, unlabored at rest. Abd soft. Up with minimal support to [facility]. Moves all extremities. Pain controlled with current regimen.   Plan: - DNR in place - Likely home with hospice - No changes to pain/bowel regimen  70 min  Jaime Channel, NP Palliative Medicine Team Pager [phone] (Please see [email or web address] for schedule) Team Phone [phone]    Greater than 50%  of this time was spent counseling and coordinating care related to the above assessment and plan

## 2023-04-02 NOTE — Progress Notes (Signed)
[facility] Progress Note Patient: Jaime Gomez ZHY:[id number] DOB: 1966-07-24 DOA: 03/27/2023  DOS: the patient was seen and examined on 04/02/2023  Brief hospital course: PMH of type II DM, HTN, depression, hydronephrosis, CKD 4, endometrial cancer with metastasis, PE on Lovenox presented to hospital with complaints of worsening abdominal pain as well as leg pain. Found to have anemia and received a blood transfusion. Oncology following.  Palliative care was following. Currently goal is for pain control with most likely transition to hospice on discharge.  Assessment and Plan: AKI on CKD 4. Nontraumatic mild rhabdomyolysis in the setting of fall Metabolic acidosis Hyperkalemia Treated with bicarb. Received blood transfusion as well. Serum creatinine now stabilizing. CK was around 3700 and now improving. Receiving Lokelma for hyperkalemia. Monitor for now.  Endometrial Cancer with Metastases Has received multiple different treatments under the care of Dr. Myna Hidalgo, Multiple metastasis with ongoing pain. DNR/DNI.  Most likely plan for going home with hospice. Highly appreciate palliative care assistance.  Multifocal pulmonary metastasis. Resulting in bilateral expiratory wheezing. Continue nebulizer therapy will switch to scheduled therapy doses. Chest x-ray unchanged. Do not think that her renal function will handle diuresis and does not appear to be volume overloaded right now.   Hx of DVT and PE  On IV heparin for now. Will transition back to Lovenox tomorrow.   Insulin-dependent DM with associated hypoglycemia A1c was 7.7% in August 2024.  Monitor on sliding scale insulin.   Hypertension  Was hypotensive. Blood pressure medication on hold. Monitor.   Depression and Anxiety Continue with venlafaxine XR 37.5 mg p.o. daily with breakfast  Recent Enterococcus UTI Pansensitive  Treated with antibiotic.  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 Original plan was surgical intervention on 9/10. After discussion with orthopedics family has decided to hold off on surgery. Continue pain control.  Right toe ulcer and wound MRI Foot no evidence of infection. Continue topical care. Vascular surgery recommended conservative measures.  Goals of care conversation. Prognosis is very poor. Per patient and family quality of life and symptom control is important. Surgical intervention is likely not going to reduce the pain and likely will result in renal dysfunction worsening. Appears to have shortness of breath which might also get worse on further as her metastatic pulmonary disease progress further. Given this ideal plan would be to transition to hospice with focus on complete comfort. Also hemoglobin is stable.  Transfusion was ordered for surgical intervention but given that the patient has decided not to proceed with transfusion I have discontinued the transfusion.   Subjective: Has shortness of breath but no nausea no vomiting.  Was trying to get out of the bed.  Physical Exam: In moderate distress. No rash. No edema. Bilateral expiratory wheezing without any crackling. No secretion or crackling heard. S1-S2 present although tachycardic. Alert but not oriented.  Data Reviewed: I have Reviewed nursing notes, Vitals, and Lab results. Since last encounter, pertinent lab results CBC and BMP   . I have ordered test including BMP  . I have discussed pt's care plan and test results with palliative care, oncology  . I have ordered imaging chest x-ray  .  Disposition: Status is: Inpatient Remains inpatient appropriate because: Awaiting clarity on goals of care  Family Communication: No one at bedside. Level of care: Progressive   Vitals:   04/02/23 0410 04/02/23 0500  04/02/23 1215 04/02/23 1723  BP: (!) 151/85  (!) 141/87   Pulse: 92  95   Resp: 20  17   Temp: 97.9 F (36.6 C)  98.2 F (36.8 C)   TempSrc: Oral  Oral   SpO2: 92%  98% 99%  Weight:  71.7 kg    Height:         Author: Lynden Oxford, MD 04/02/2023 7:12 PM  Please look on [email or web address] to find out who is on call.

## 2023-04-02 NOTE — Progress Notes (Signed)
ANTICOAGULATION CONSULT NOTE   Pharmacy Consult for IV heparin Indication: pulmonary embolus/DVT (LMWH on hold)  Allergies  Allergen Reactions   Paclitaxel Anaphylaxis, Swelling and Other (See Comments)    Made everything swell   Taxotere [Docetaxel] Anaphylaxis, Swelling and Other (See Comments)    Throat swelling    Patient Measurements: Height: 5\' 4"  (162.6 cm) Weight: 69.3 kg (152 lb 12.5 oz) IBW/kg (Calculated) : 54.7 Heparin Dosing Weight: n/a. Use total body weight  Vital Signs: Temp: 98.1 F (36.7 C) (09/08 2032) Temp Source: Oral (09/08 2032) BP: 123/79 (09/08 2032) Pulse Rate: 94 (09/08 2032)  Labs: Recent Labs    03/30/23 0353 03/31/23 0835 03/31/23 1050 03/31/23 1902 04/01/23 0459 04/01/23 1530 04/02/23 0112  HGB 9.7*  --  9.1*  --  8.7*  --  8.7*  HCT 31.3*  --  29.5*  --  28.9*  --  28.5*  PLT 195  --  181  --  155  --  150  HEPARINUNFRC  --    < >  --    < > 0.29* 0.35 0.35  CREATININE 4.23*  --  3.81*  --  3.98*  --   --   CKTOTAL 416*  --   --   --  82  --   --    < > = values in this interval not displayed.    Estimated Creatinine Clearance: 14.7 mL/min (A) (by C-G formula based on SCr of 3.98 mg/dL (H)).  Medications: Therapeutic LMWH chronically  Assessment: Pharmacy is consulted to dose heparin drip on 57 yo female with PMH of PE/DVT. Pt chronically anticoagulated with enoxaparin 60 mg subQ daily PTA (~1 mg/kg). Hx CKD but AKI on admission.   Planning for ortho surgery on 9/10. Anticoagulation transitioned to UFH for peri-procedural anticoagulation. Last dose of enoxaparin on 9/5 at 2222.    Today, 04/02/23 01:12 Heparin level = 0.35 is therapeutic on heparin infusion of 1400 units/hr CBC: Hgb low but stable, Plt WNL No complications of therapy noted Bmet - pending  Goal of Therapy:  Heparin level 0.3-0.7 units/ml Monitor platelets by anticoagulation protocol: Yes   Plan:  Continue heparin infusion at 1400 units/hr CBC, heparin  level daily Monitor for signs of bleeding   Jaime Gomez, PharmD 04/02/23  @ 1:59 AM

## 2023-04-02 NOTE — Progress Notes (Signed)
Jaime Gomez feels okay this morning.  She says her pain is doing better.  She says that the Duragesic patch seems to be helping.  She seems to be eating a little bit more.  I told that she had to have 5-6 small meals a day.  Her labs show BUN 74 creatinine 3.85.  Blood sugar 136.  Calcium 8.3 with an albumin of 2.1.  Her white cell count is 5.6.  Hemoglobin 8.7.  Platelet count 150,000.  We may have to give her a unit of blood just so that we can get her to surgery..  She says she will have surgery.  Over the weekend, there is some note saying that she was having some reservations about surgery.  I really think that she is going need to have surgery to help with the pain with the right tibia.  I do not see any other way of trying to help this.  The surgery will not be that invasive.  I think she will heal up from it.  I think given her transfusion will help.  Her vital signs are temperature 97.9.  Pulse 92.  Blood pressure 151/85.  Her lungs sound pretty good.  She has no wheezing.  Cardiac exam regular rate and rhythm.  Abdomen is soft.  Bowel sounds are present.  There is no fluid wave.  There is no palpable liver or spleen tip.  Extremity shows no clubbing, cyanosis or edema.   [name] hopefully will have her surgery tomorrow.  Again I really believe that surgery has much better benefit than risk.  It sounds like she will have a spinal.  I do not see any reason why she could have a spinal.  She is on heparin and we can stop this easily before surgery.  I will give her some blood today.  I will give her 1 unit of blood.  I just think this will help with her surgery and with her recovering from surgery.  Hopefully, she will be able to get out of the hospital later on this week.  I know she is in great care from everybody up on [address].   [name], MD   Hebrews 12:12

## 2023-04-02 NOTE — Progress Notes (Signed)
OT Cancellation Note  Patient Details Name: Jaime Gomez MRN: [id number] DOB: 28-Jul-1965   Cancelled Treatment:    Reason Eval/Treat Not Completed: Pain limiting ability to participate;Fatigue/lethargy limiting ability to participate Patient reporting 7/10 pain and "needing to get myself together" prior to therapy. OT to continue to follow.  Rosalio Loud, MS Acute Rehabilitation Department Office# [phone]  04/02/2023, 8:06 AM

## 2023-04-02 NOTE — Progress Notes (Signed)
Patient is standing at the bedside with Jason Nest ,Nurse Tech when patient tried to pivot and landed face down on the bed. She said she didn't hurt herself and did not hit any part of her body to the hard parts of the bed. No visible injury seen. Charge Nurse, Oncall NP and [facility] was informed of the incident.

## 2023-04-03 ENCOUNTER — Encounter (HOSPITAL_COMMUNITY): Admission: RE | Payer: Self-pay | Source: Home / Self Care

## 2023-04-03 ENCOUNTER — Ambulatory Visit (HOSPITAL_COMMUNITY): Admission: RE | Admit: 2023-04-03 | Payer: 59 | Source: Home / Self Care | Admitting: Orthopaedic Surgery

## 2023-04-03 ENCOUNTER — Ambulatory Visit: Payer: 59 | Admitting: Student

## 2023-04-03 DIAGNOSIS — Z7189 Other specified counseling: Secondary | ICD-10-CM

## 2023-04-03 DIAGNOSIS — Z515 Encounter for palliative care: Secondary | ICD-10-CM

## 2023-04-03 DIAGNOSIS — C541 Malignant neoplasm of endometrium: Secondary | ICD-10-CM | POA: Diagnosis not present

## 2023-04-03 LAB — CBC WITH DIFFERENTIAL/PLATELET
Abs Immature Granulocytes: 0.14 10*3/uL — ABNORMAL HIGH (ref 0.00–0.07)
Basophils Absolute: 0 10*3/uL (ref 0.0–0.1)
Basophils Relative: 1 %
Eosinophils Absolute: 0.2 10*3/uL (ref 0.0–0.5)
Eosinophils Relative: 3 %
HCT: 27 % — ABNORMAL LOW (ref 36.0–46.0)
Hemoglobin: 8.1 g/dL — ABNORMAL LOW (ref 12.0–15.0)
Immature Granulocytes: 2 %
Lymphocytes Relative: 8 %
Lymphs Abs: 0.4 10*3/uL — ABNORMAL LOW (ref 0.7–4.0)
MCH: 27.9 pg (ref 26.0–34.0)
MCHC: 30 g/dL (ref 30.0–36.0)
MCV: 93.1 fL (ref 80.0–100.0)
Monocytes Absolute: 0.6 10*3/uL (ref 0.1–1.0)
Monocytes Relative: 10 %
Neutro Abs: 4.5 10*3/uL (ref 1.7–7.7)
Neutrophils Relative %: 76 %
Platelets: 143 10*3/uL — ABNORMAL LOW (ref 150–400)
RBC: 2.9 MIL/uL — ABNORMAL LOW (ref 3.87–5.11)
RDW: 17.8 % — ABNORMAL HIGH (ref 11.5–15.5)
WBC: 5.9 10*3/uL (ref 4.0–10.5)
nRBC: 0 % (ref 0.0–0.2)

## 2023-04-03 LAB — GLUCOSE, CAPILLARY
Glucose-Capillary: 86 mg/dL (ref 70–99)
Glucose-Capillary: 138 mg/dL — ABNORMAL HIGH (ref 70–99)
Glucose-Capillary: 163 mg/dL — ABNORMAL HIGH (ref 70–99)

## 2023-04-03 LAB — PREPARE RBC (CROSSMATCH)

## 2023-04-03 LAB — COMPREHENSIVE METABOLIC PANEL
ALT: 13 U/L (ref 0–44)
AST: 11 U/L — ABNORMAL LOW (ref 15–41)
Albumin: 2.1 g/dL — ABNORMAL LOW (ref 3.5–5.0)
Alkaline Phosphatase: 80 U/L (ref 38–126)
Anion gap: 11 (ref 5–15)
BUN: 62 mg/dL — ABNORMAL HIGH (ref 6–20)
CO2: 23 mmol/L (ref 22–32)
Calcium: 8.8 mg/dL — ABNORMAL LOW (ref 8.9–10.3)
Chloride: 104 mmol/L (ref 98–111)
Creatinine, Ser: 3.75 mg/dL — ABNORMAL HIGH (ref 0.44–1.00)
GFR, Estimated: 13 mL/min — ABNORMAL LOW (ref >60)
Glucose, Bld: 102 mg/dL — ABNORMAL HIGH (ref 70–99)
Potassium: 4.3 mmol/L (ref 3.5–5.1)
Sodium: 138 mmol/L (ref 135–145)
Total Bilirubin: 0.7 mg/dL (ref 0.3–1.2)
Total Protein: 5.7 g/dL — ABNORMAL LOW (ref 6.5–8.1)

## 2023-04-03 LAB — HEPARIN LEVEL (UNFRACTIONATED): Heparin Unfractionated: 0.19 [IU]/mL — ABNORMAL LOW (ref 0.30–0.70)

## 2023-04-03 SURGERY — INTRAMEDULLARY (IM) NAIL FEMORAL
Anesthesia: Choice | Laterality: Right

## 2023-04-03 MED ORDER — IPRATROPIUM BROMIDE 0.02 % IN SOLN
0.5000 mg | Freq: Four times a day (QID) | RESPIRATORY_TRACT | Status: DC
  Administered 2023-04-04 (×3): 0.5 mg via RESPIRATORY_TRACT
  Filled 2023-04-03 (×3): qty 2.5

## 2023-04-03 MED ORDER — SODIUM CHLORIDE 0.9 % IV SOLN
30.0000 mg | Freq: Once | INTRAVENOUS | Status: AC
  Administered 2023-04-03: 30 mg via INTRAVENOUS
  Filled 2023-04-03: qty 10

## 2023-04-03 MED ORDER — LEVALBUTEROL HCL 0.63 MG/3ML IN NEBU
0.6300 mg | INHALATION_SOLUTION | Freq: Four times a day (QID) | RESPIRATORY_TRACT | Status: DC
  Administered 2023-04-04 (×3): 0.63 mg via RESPIRATORY_TRACT
  Filled 2023-04-03 (×3): qty 3

## 2023-04-03 MED ORDER — LEVALBUTEROL HCL 0.63 MG/3ML IN NEBU: 0.6300 mg | INHALATION_SOLUTION | Freq: Three times a day (TID) | RESPIRATORY_TRACT | Status: DC | PRN

## 2023-04-03 MED ORDER — SENNA 8.6 MG PO TABS
2.0000 | ORAL_TABLET | Freq: Every day | ORAL | Status: DC
  Administered 2023-04-03: 17.2 mg via ORAL
  Filled 2023-04-03: qty 2

## 2023-04-03 MED ORDER — ENOXAPARIN SODIUM 80 MG/0.8ML IJ SOSY
70.0000 mg | PREFILLED_SYRINGE | Freq: Every day | INTRAMUSCULAR | Status: DC
  Administered 2023-04-03 – 2023-04-08 (×6): 70 mg via SUBCUTANEOUS
  Filled 2023-04-03 (×6): qty 0.8

## 2023-04-03 MED ORDER — SODIUM CHLORIDE 0.9% IV SOLUTION: Freq: Once | INTRAVENOUS | Status: DC

## 2023-04-03 MED ORDER — FUROSEMIDE 10 MG/ML IJ SOLN: 20.0000 mg | Freq: Once | INTRAMUSCULAR | Status: DC

## 2023-04-03 NOTE — Progress Notes (Signed)
Occupational Therapy Treatment Patient Details Name: Jaime Gomez MRN: [id number] DOB: [birth year] Today's Date: 04/03/2023   History of present illness 57 year old Caucasian female with past medical history significant for but not limited to hypertension, insulin-dependent diabetes mellitus type 2, depression, malignant hydronephrosis with right ureteral stent, CKD stage IV, endometrial cancer with metastasis, history of PE and DVT on anticoagulants enoxaparin as well as other comorbidities who presented with progressive generalized weakness, fatigue and pain.  She is having progressive dyspnea and likely due to her pulmonary metastasis and was recently started on supplemental oxygen.   OT comments  Patient was agreeable to participate in session. Patient was supervision with RW in room to participate in toileting tasks with cues needed for safety. Patients caregiver was present during session and provided encouragement and reinforcement of safety cues during session. Patient was motivated to participate in increasing functional activity tolerance during session. Patient plans to transition home with hospice and caregiver support at time of d/c. Patient's discharge plan remains appropriate at this time. OT will continue to follow acutely.        If plan is discharge home, recommend the following:  A little help with walking and/or transfers;A little help with bathing/dressing/bathroom;Assist for transportation   Equipment Recommendations  None recommended by OT       Precautions / Restrictions Precautions Precautions: Fall;Other (comment) Restrictions Weight Bearing Restrictions: No Other Position/Activity Restrictions: left tibia lesion that patient reported is WBAT with RW.       Mobility Bed Mobility Overal bed mobility: Modified Independent             General bed mobility comments: with HOB raised. caregiver was present and able to cue patient where to put hands ect.       Balance Overall balance assessment: History of Falls             ADL either performed or assessed with clinical judgement   ADL Overall ADL's : Needs assistance/impaired                         Toilet Transfer: Supervision/safety;Ambulation;Rolling walker (2 wheels) Toilet Transfer Details (indicate cue type and reason): education on using BSC as there was no portable O2 in room. patient was provided with tools needed for her to be able to get into bathroom with nursing or her caregiver with RW> Toileting- Clothing Manipulation and Hygiene: Supervision/safety;Sit to/from stand Toileting - Clothing Manipulation Details (indicate cue type and reason): with increased time.              Cognition Arousal: Alert Behavior During Therapy: Flat affect Overall Cognitive Status: Within Functional Limits for tasks assessed       General Comments: patients caregiver was present during session. patient was motivated to participate during agreed upon time for session on this date.                   Pertinent Vitals/ Pain       Pain Assessment Pain Assessment: 0-10 Pain Score: 7  Pain Location: RLE Pain Descriptors / Indicators: Discomfort, Grimacing, Constant Pain Intervention(s): Limited activity within patient's tolerance, Monitored during session         Frequency  Min 1X/week        Progress Toward Goals  OT Goals(current goals can now be found in the care plan section)  Progress towards OT goals: Progressing toward goals     Plan  AM-PAC OT "6 Clicks" Daily Activity     Outcome Measure   Help from another person eating meals?: None Help from another person taking care of personal grooming?: A Little Help from another person toileting, which includes using toliet, bedpan, or urinal?: A Little Help from another person bathing (including washing, rinsing, drying)?: A Little Help from another person to put on and taking off regular upper  body clothing?: A Little Help from another person to put on and taking off regular lower body clothing?: A Little 6 Click Score: 19    End of Session Equipment Utilized During Treatment: Rolling walker (2 wheels);Oxygen  OT Visit Diagnosis: Unsteadiness on feet (R26.81);Muscle weakness (generalized) (M62.81)   Activity Tolerance Patient tolerated treatment well   Patient Left in chair;with call bell/phone within reach;with family/visitor present   Nurse Communication Mobility status;Patient requests pain meds        Time: 9604-5409 OT Time Calculation (min): 31 min  Charges: OT General Charges $OT Visit: 1 Visit OT Treatments $Self Care/Home Management : 23-37 mins  Rosalio Loud, MS Acute Rehabilitation Department Office# [phone]   Selinda Flavin 04/03/2023, 12:09 PM

## 2023-04-03 NOTE — Progress Notes (Signed)
ANTICOAGULATION CONSULT NOTE   Pharmacy Consult for IV heparin Indication: pulmonary embolus/DVT (LMWH on hold)  Allergies  Allergen Reactions   Paclitaxel Anaphylaxis, Swelling and Other (See Comments)    Made everything swell   Taxotere [Docetaxel] Anaphylaxis, Swelling and Other (See Comments)    Throat swelling    Patient Measurements: Height: 5\' 4"  (162.6 cm) Weight: 68.5 kg (151 lb 0.2 oz) IBW/kg (Calculated) : 54.7 Heparin Dosing Weight: n/a. Use total body weight  Vital Signs: Temp: 98.4 F (36.9 C) (09/09 2029) Temp Source: Oral (09/09 2029) BP: 142/85 (09/09 2029) Pulse Rate: 101 (09/09 2029)  Labs: Recent Labs    03/31/23 1050 03/31/23 1902 04/01/23 0459 04/01/23 1530 04/02/23 0112 04/03/23 0440  HGB 9.1*  --  8.7*  --  8.7* 8.1*  HCT 29.5*  --  28.9*  --  28.5* 27.0*  PLT 181  --  155  --  150 143*  HEPARINUNFRC  --    < > 0.29* 0.35 0.35 0.19*  CREATININE 3.81*  --  3.98*  --  3.85*  --   CKTOTAL  --   --  82  --   --   --    < > = values in this interval not displayed.    Estimated Creatinine Clearance: 15.1 mL/min (A) (by C-G formula based on SCr of 3.85 mg/dL (H)).  Medications: Therapeutic LMWH chronically  Assessment: Pharmacy is consulted to dose heparin drip on 57 yo female with PMH of PE/DVT. Pt chronically anticoagulated with enoxaparin 60 mg subQ daily PTA (~1 mg/kg). Hx CKD but AKI on admission.   Planning for ortho surgery on 9/10. Anticoagulation transitioned to UFH for peri-procedural anticoagulation. Last dose of enoxaparin on 9/5 at 2222.    Today, 04/03/23 Heparin level = 0.19 is now subtherapeutic on heparin infusion of 1400 units/hr (previously therapeutic on this same rate) CBC: Hgb low but stable, Plt WNL No complications of therapy noted RN confirms that there are no issues with IV site and heparin has been infusing at appropriate rate uninterrupted  Goal of Therapy:  Heparin level 0.3-0.7 units/ml Monitor platelets by  anticoagulation protocol: Yes   Plan:  Increase heparin infusion to 1550 units/hr Check heparin level 8 hr after heparin rate increase CBC, heparin level daily Monitor for signs of bleeding   Maryellen Pile, PharmD 04/03/23  @ 5:13 AM

## 2023-04-03 NOTE — Progress Notes (Signed)
Palliative:  HPI:  57 y.o. 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  Plans for chemotherapy have been on hold due to hospitalizations and to await surgery. Admitted for acute on chronic renal injury and worsening dypnea, and anemia. Palliative medicine consulted for GOC.   Jaime Gomez is sleeping - caregiver at bedside - I did not awaken. I called and discussed with sister/HCPOA, Jaime Gomez. Jaime Gomez shares that they are awaiting input from radiation oncology. Jaime Gomez shares that she worries that Jaime Gomez is continuing to agree to interventions to try and please her medical team but maybe not what she really wants. I encouraged that they can discuss with radiation oncology the risks vs benefits of treatment and their recommendations. I updated Dr. Roselind Gomez to Jaime Gomez's concerns and that she would be available for further discussion after 12pm today. They continue to agree with hospice support at home which will be arranged. Discussed with Dr. Allena Gomez.   All questions/concerns addressed. Emotional support provided.   Exam: Sleeping - peacefully. I did not awaken. No distress. Breathing regular, unlabored.   Plans: - DNR - Home with hospice - No changes to pain regimen - LBM 9/8: Increasing senokot 2 tabs daily  30 min  Jaime Channel, NP Palliative Medicine Team Pager [phone] (Please see [email or web address] for schedule) Team Phone [phone]    Greater than 50%  of this time was spent counseling and coordinating care related to the above assessment and plan

## 2023-04-03 NOTE — TOC Progression Note (Signed)
Transition of Care [facility]) - Progression Note    Patient Details  Name: Jaime Gomez MRN: [id number] Date of Birth: 1966/07/01  [facility]) CM/SW Contact  Larrie Kass, LCSW Phone Number: 04/03/2023, 1:25 PM  Clinical Narrative:    Pt recommended for home hospice, palliative NP made referral to [facility]. [facility] is reviewing pt for home hospice. TOC to follow for d/c needs.    Expected Discharge Plan:  (TBD) Barriers to Discharge: Continued Medical Work up  Expected Discharge Plan and Services In-house Referral: Clinical Social Work     Living arrangements for the past 2 months: Single Family Home                                       Social Determinants of Health (SDOH) Interventions SDOH Screenings   Food Insecurity: No Food Insecurity (03/27/2023)  Housing: Low Risk  (03/27/2023)  Transportation Needs: No Transportation Needs (03/27/2023)  Utilities: Not At Risk (03/27/2023)  Depression (PHQ2-9): Low Risk  (03/01/2023)  Recent Concern: Depression (PHQ2-9) - High Risk (12/26/2022)  Financial Resource Strain: Low Risk  (06/27/2022)  Social Connections: Unknown (12/06/2021)   [facility] from [facility], [facility]  Stress: No Stress Concern Present (06/27/2022)  Tobacco Use: Low Risk  (03/27/2023)    Readmission Risk Interventions    02/10/2023    9:27 AM 11/30/2022   10:29 AM  Readmission Risk Prevention Plan  Transportation Screening Complete Complete  PCP or Specialist Appt within 3-5 Days  Complete  HRI or Home Care Consult  Complete  Social Work Consult for Recovery Care Planning/Counseling  Complete  Palliative Care Screening  Not Applicable  Medication Review Oceanographer) Complete Complete  PCP or Specialist appointment within 3-5 days of discharge Complete   HRI or Home Care Consult Complete   SW Recovery Care/Counseling Consult Complete   Palliative Care Screening Not Applicable   Skilled Nursing Facility Not Applicable

## 2023-04-03 NOTE — Progress Notes (Signed)
   This pt was referred for hospice services at home. We have reviewed the chart and introduced hospice services to the sister whom pt lives with. She does not need any equipment at this time before d/c can be completed. Reviewed with our MD and the pt has been approved for home care services.  If radiation completed outpt. It will be related and covered by hospice services.   Norm Parcel RN [phone]

## 2023-04-03 NOTE — Progress Notes (Signed)
Jaime Gomez has sought to have surgery for the right tibia.  I will have to talk with Radiation Oncology to see if they would still radiate that area to try to help minimize fracture.  I will have to use low-dose Aredia.  I think that we can get Hospice for her  at home.  I talked to she and her sister about this.  I think Hospice would be a great idea for her.  I told her that if, for some reason, she cannot be at home, then we could get her to over to [facility].  I still think that she could have done okay with the surgery for her right tibia.  However, it is her decision and I respected.  At least, her renal function might be a little bit better.  The BUN is 62 creatinine 3.75.  Her albumin is 2.1.  Her white cell count 5.9.  Hemoglobin 8.1.  Platelet count 143,000.  She is not eating all that much.  Maybe, when she gets home, she would be able to eat a little bit more.  She says her pain control is okay for right now.  That could certainly change.  Again, this is all about respect, dignity and comfort.  I think that getting Hospice involved will certainly help with this.  I will have to see if she can have radiation for the right tibia.  Sometimes, it would be much easier to do this with the patient as a inpatient.  I do not know if she could get a high fraction in 1 or 2 doses of radiation.  Again I will have to talk with Radiation Oncology about this.  Her hemoglobin is only 8.1.  I really think she is going need to be transfused.  I had ordered 1 unit yesterday but this was canceled I guess because she is not having surgery.  There is no cough.  There is no problems with diarrhea.  She has had no nausea right now.   Her vital signs are stable.  Temperature 97.8.  Pulse 92.  Blood pressure 136/83.  Her head neck exam shows no ocular or oral lesions.  There is no mucositis.  There is no thrush.  Lungs are clear bilaterally.  Cardiac exam regular rate and rhythm.  Abdomen is soft.  Bowel  sounds are decreased but present.  There is no guarding or rebound tenderness.  There is no obvious fluid wave.  Extremities show some tenderness in the right lower leg.  There may be a little bit of swelling but not too bad.  She is on anticoagulation.  Thankfully there is no bleeding.  Again, I think the issue is whether or not she is going to have radiation.  If she does, we will this be inpatient or outpatient.  Hopefully, she will be able to go home before the weekend.  I know she is getting great care from everybody on [address]   [name], MD  Romans 8:28

## 2023-04-03 NOTE — Plan of Care (Signed)
  Problem: Safety: Goal: Ability to remain free from injury will improve Outcome: Progressing   Problem: Pain Managment: Goal: General experience of comfort will improve Outcome: Progressing   Problem: Skin Integrity: Goal: Risk for impaired skin integrity will decrease Outcome: Progressing   Problem: Coping: Goal: Ability to adjust to condition or change in health will improve Outcome: Progressing

## 2023-04-03 NOTE — Progress Notes (Signed)
[facility] Progress Note Patient: Jaime Gomez UJW:[id number] DOB: 1966-05-23 DOA: 03/27/2023  DOS: the patient was seen and examined on 04/03/2023  Brief hospital course: PMH of type II DM, HTN, depression, hydronephrosis, CKD 4, endometrial cancer with metastasis, PE on Lovenox presented to hospital with complaints of worsening abdominal pain as well as leg pain. Found to have anemia and received a blood transfusion. Oncology following.  Palliative care was following. Currently goal is for pain control with most likely transition to hospice on discharge.  Assessment and Plan: AKI on CKD 4. Nontraumatic mild rhabdomyolysis in the setting of fall Metabolic acidosis Hyperkalemia Treated with bicarb. Received blood transfusion as well. Serum creatinine now stabilizing. CK was around 3700 and now improving. Receiving Lokelma for hyperkalemia. Monitor for now.  Endometrial Cancer with Metastases Has received multiple different treatments under the care of Dr. Myna Hidalgo, Multiple metastasis with ongoing pain. DNR/DNI.  Most likely plan for going home with hospice. Highly appreciate palliative care assistance.  Multifocal pulmonary metastasis. Resulting in bilateral expiratory wheezing. Continue nebulizer therapy will switch to scheduled therapy doses. Chest x-ray unchanged. Do not think that her renal function will handle diuresis and does not appear to be volume overloaded right now.   Hx of DVT and PE  Was IV heparin.  Now on Lovenox.   Insulin-dependent DM with associated hypoglycemia A1c was 7.7% in August 2024.  Monitor on sliding scale insulin.   Hypertension  Was hypotensive. Blood pressure medication on hold. Monitor.   Depression and Anxiety Continue with venlafaxine XR 37.5 mg p.o. daily with breakfast  Recent Enterococcus UTI Pansensitive  Treated with antibiotic.  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 Original plan was surgical intervention on 9/10. After discussion with orthopedics family has decided to hold off on surgery. Continue pain control.  Right toe ulcer and wound MRI Foot no evidence of infection. Continue topical care. Vascular surgery recommended conservative measures.  Goals of care conversation. Prognosis is very poor. Per patient and family quality of life and symptom control is important. Surgical intervention is likely not going to reduce the pain and likely will result in renal dysfunction worsening. Appears to have shortness of breath which might also get worse on further as her metastatic pulmonary disease progress further. Given this ideal plan would be to transition to hospice with focus on complete comfort. Also hemoglobin is stable.  Transfusion was ordered for surgical intervention but given that the patient has decided not to proceed with transfusion I have discontinued the transfusion.   Subjective: pain well-controlled.  Sitting in the chair.  Oral intake adequate.  Physical Exam: Clear to auscultation. S1-S2 present. Wheezing has resolved. No edema.  Data Reviewed: I have Reviewed nursing notes, Vitals, and Lab results. Since last encounter, pertinent lab results CBC and BMP   . I have ordered test including CBC and BMP  . I have discussed pt's care plan and test results with palliative care and medical oncology  .   Disposition: Status is: Inpatient Remains inpatient appropriate because: Awaiting clarity on goals of care   Family Communication: Discussed with sister at bedside Level of care: Progressive  Vitals:   04/03/23 0805 04/03/23 0808 04/03/23 1858 04/03/23 1923  BP:   138/80   Pulse:   (!) 101   Resp:   18   Temp:   99.4 F (37.4  C)   TempSrc:   Oral   SpO2: 98% 98% 93% 95%  Weight:      Height:          Author: Lynden Oxford, MD 04/03/2023 8:08 PM  Please look on [email or web address] to find out who is on call.

## 2023-04-04 ENCOUNTER — Ambulatory Visit
Admit: 2023-04-04 | Discharge: 2023-04-04 | Disposition: A | Discharge status: Home / Self Care | Payer: 59 | Attending: Radiation Oncology | Admitting: Radiation Oncology

## 2023-04-04 ENCOUNTER — Ambulatory Visit
Admission: RE | Admit: 2023-04-04 | Discharge: 2023-04-04 | Disposition: A | Discharge status: Home / Self Care | Payer: 59 | Source: Ambulatory Visit | Attending: Radiation Oncology | Admitting: Radiation Oncology

## 2023-04-04 DIAGNOSIS — Z515 Encounter for palliative care: Secondary | ICD-10-CM | POA: Diagnosis not present

## 2023-04-04 DIAGNOSIS — C7951 Secondary malignant neoplasm of bone: Secondary | ICD-10-CM | POA: Insufficient documentation

## 2023-04-04 DIAGNOSIS — J9611 Chronic respiratory failure with hypoxia: Secondary | ICD-10-CM | POA: Diagnosis not present

## 2023-04-04 DIAGNOSIS — C541 Malignant neoplasm of endometrium: Secondary | ICD-10-CM

## 2023-04-04 DIAGNOSIS — C7801 Secondary malignant neoplasm of right lung: Secondary | ICD-10-CM | POA: Diagnosis not present

## 2023-04-04 DIAGNOSIS — T796XXA Traumatic ischemia of muscle, initial encounter: Secondary | ICD-10-CM

## 2023-04-04 DIAGNOSIS — Z510 Encounter for antineoplastic radiation therapy: Secondary | ICD-10-CM | POA: Insufficient documentation

## 2023-04-04 DIAGNOSIS — Z7189 Other specified counseling: Secondary | ICD-10-CM | POA: Diagnosis not present

## 2023-04-04 DIAGNOSIS — N179 Acute kidney failure, unspecified: Secondary | ICD-10-CM | POA: Diagnosis not present

## 2023-04-04 LAB — GLUCOSE, CAPILLARY
Glucose-Capillary: 134 mg/dL — ABNORMAL HIGH (ref 70–99)
Glucose-Capillary: 133 mg/dL — ABNORMAL HIGH (ref 70–99)
Glucose-Capillary: 128 mg/dL — ABNORMAL HIGH (ref 70–99)
Glucose-Capillary: 135 mg/dL — ABNORMAL HIGH (ref 70–99)

## 2023-04-04 LAB — COMPREHENSIVE METABOLIC PANEL
ALT: 13 U/L (ref 0–44)
AST: 13 U/L — ABNORMAL LOW (ref 15–41)
Albumin: 2.3 g/dL — ABNORMAL LOW (ref 3.5–5.0)
Alkaline Phosphatase: 93 U/L (ref 38–126)
Anion gap: 12 (ref 5–15)
BUN: 57 mg/dL — ABNORMAL HIGH (ref 6–20)
CO2: 20 mmol/L — ABNORMAL LOW (ref 22–32)
Calcium: 9 mg/dL (ref 8.9–10.3)
Chloride: 104 mmol/L (ref 98–111)
Creatinine, Ser: 3.83 mg/dL — ABNORMAL HIGH (ref 0.44–1.00)
GFR, Estimated: 13 mL/min — ABNORMAL LOW (ref >60)
Glucose, Bld: 185 mg/dL — ABNORMAL HIGH (ref 70–99)
Potassium: 4.9 mmol/L (ref 3.5–5.1)
Sodium: 136 mmol/L (ref 135–145)
Total Bilirubin: 0.6 mg/dL (ref 0.3–1.2)
Total Protein: 6.2 g/dL — ABNORMAL LOW (ref 6.5–8.1)

## 2023-04-04 MED ORDER — IPRATROPIUM BROMIDE 0.02 % IN SOLN
0.5000 mg | Freq: Three times a day (TID) | RESPIRATORY_TRACT | Status: DC
  Administered 2023-04-05 – 2023-04-07 (×6): 0.5 mg via RESPIRATORY_TRACT
  Filled 2023-04-04 (×6): qty 2.5

## 2023-04-04 MED ORDER — SORBITOL 70 % SOLN
30.0000 mL | Freq: Once | Status: AC
  Administered 2023-04-04: 30 mL via ORAL
  Filled 2023-04-04: qty 30

## 2023-04-04 MED ORDER — SENNOSIDES-DOCUSATE SODIUM 8.6-50 MG PO TABS
2.0000 | ORAL_TABLET | Freq: Every day | ORAL | Status: DC
  Administered 2023-04-04 – 2023-04-07 (×4): 2 via ORAL
  Filled 2023-04-04 (×4): qty 2

## 2023-04-04 MED ORDER — LEVALBUTEROL HCL 0.63 MG/3ML IN NEBU
0.6300 mg | INHALATION_SOLUTION | Freq: Three times a day (TID) | RESPIRATORY_TRACT | Status: DC
  Administered 2023-04-05 – 2023-04-07 (×6): 0.63 mg via RESPIRATORY_TRACT
  Filled 2023-04-04 (×6): qty 3

## 2023-04-04 MED ORDER — SORBITOL 70 % SOLN: 30.0000 mL | Freq: Every day | Status: DC | PRN

## 2023-04-04 NOTE — Progress Notes (Signed)
Ms. Elward seems to be doing okay this morning.  She still having some pain issues.  I did speak with Dr. Roselind Messier of Radiation Oncology yesterday.  He says that he can do a short course of radiation therapy to the right tibia.  He said he might be 2 or 3 fractions.  I think this to be reasonable.  He has not yet seen Ms. Linen.  Maybe today, he will take her down to radiation therapy and plan for treatment.  She might be eating a little bit more.  It is really hard to say how much she is eating.  She has had no cough.  Is been no shortness of breath.  Her BUN is 57 creatinine 3.83.  Her blood sugar is 185.  Albumin is 2.3.  She has had no fever.  There is been no obvious bleeding.  Again, I think we need to see if she can get radiotherapy to that tibial lesion.  Hopefully, this may help decrease the risk of fracture.  I know that she will still have a risk of fracturing.  It is hard to say if she is getting out of bed.  It is hard to say if she is really all that ambulatory.  Again, she will not get systemic chemotherapy.  I think the benefit does not outweigh the risk.  We are dealing with quality-of-life issues.  I know that Hospice will help when she gets home.  I know she will get great care from all staff up on 4 W.  I appreciate their efforts.   [name], MD  Franky Macho 1:37

## 2023-04-04 NOTE — Progress Notes (Signed)
PROGRESS NOTE  Jaime Gomez BJY:[id number] DOB: 12-Jul-1966   PCP: Karie Georges, MD  Patient is from: Home  DOA: 03/27/2023 LOS: 8  Chief complaints Chief Complaint  Patient presents with   Fall     Brief Narrative / Interim history: 57 year old F with PMH of stage IV endometrial cancer with metastasis, PE/DVT on Lovenox, CKD-4, hydro process, HTN and cancer-related pain presenting with worsening abdominal pain and leg pain and admitted for anemia, cancer related pain and AKI.  Received blood transfusion for anemia.  AKI improved.  Unfortunately, pain control remains challenging.  Oncology, radiation oncology and palliative medicine following.  Started palliative radiation to right leg on 9/11.  Final disposition seems to be home with home hospice    Subjective: Seen and examined earlier this morning.  No major events overnight of this morning.  Complains pain on her side.  She is not reporting leg pain today.  She is getting ready to go for radiation.  Caregiver at bedside.  Objective: Vitals:   04/04/23 0500 04/04/23 0622 04/04/23 0829 04/04/23 0830  BP:      Pulse:  (!) 104    Resp:      Temp:      TempSrc:      SpO2:  93% 98% 98%  Weight: 69.1 kg     Height:        Examination:  GENERAL: No apparent distress.  Nontoxic. HEENT: MMM.  Vision and hearing grossly intact.  NECK: Supple.  No apparent JVD.  RESP:  No IWOB.  Fair aeration bilaterally. CVS:  RRR. Heart sounds normal.  ABD/GI/GU: BS+. Abd soft, NTND.  MSK/EXT:  Moves extremities. No apparent deformity. No edema.  SKIN: no apparent skin lesion or wound NEURO: Awake, alert and oriented appropriately.  No apparent focal neuro deficit. PSYCH: Calm. Normal affect.   Procedures:  None  Microbiology summarized: 9/3-urine culture NGTD 9/3-blood cultures NGTD  Assessment and plan: Principal Problem:   AKI (acute kidney injury) (HCC) Active Problems:   Endometrial cancer, FIGO stage IVB (HCC)    Personal history of other venous thrombosis and embolism   Type 2 diabetes mellitus without complications (HCC)   Metabolic acidosis   Depression, major, single episode, moderate (HCC)   Chronic respiratory failure with hypoxia (HCC)   Rhabdomyolysis   Malignant neoplasm metastatic to both lungs [facility])  Endometrial cancer with metastasis including pulmonary and right tibia Cancer-related pain -Started palliative radiation to right tibia on 9/11. -Appreciate help by oncology, radiation oncology and palliative medicine -Pain control per palliative medicine -Overall, very poor prognosis. -Oncology, radiation oncology and palliative following.  AKI on CKD-4: Baseline Cr ~3.3.  However, her creatinine was 4.97 on 8/30.  Has mild right hydronephrosis on CT.  Does not seem to be of nephrotoxic meds.  She had mild rhabdo from fall.  Improved. Recent Labs    03/27/23 1245 03/27/23 1750 03/28/23 0418 03/29/23 0410 03/30/23 0353 03/31/23 1050 04/01/23 0459 04/02/23 0112 04/03/23 0440 04/04/23 0329  BUN 97* 97* 93* 84* 77* 73* 74* 74* 62* 57*  CREATININE 5.53* 5.11* 5.18* 4.39* 4.23* 3.81* 3.98* 3.85* 3.75* 3.83*  -Continue monitoring.  Anemia of chronic disease: No report of overt bleeding.  Transfused 2 units on presentation.  H&H relatively stable Recent Labs    03/26/23 0324 03/27/23 1245 03/27/23 1253 03/28/23 0418 03/29/23 0410 03/30/23 0353 03/31/23 1050 04/01/23 0459 04/02/23 0112 04/03/23 0440  HGB 7.5* 8.4* 8.8* 7.2* 8.9* 9.7* 9.1* 8.7* 8.7* 8.1*  -Continue monitoring  Uncontrolled IDDM-2 with hypoglycemia: A1c 7.7% on 8/27 Recent Labs  Lab 04/03/23 0805 04/03/23 1225 04/03/23 1900 04/04/23 0745 04/04/23 1143  GLUCAP 86 138* 163* 134* 133*  -Continue current insulin regimen  History of DVT/pulmonary embolism -Continue home Lovenox  Essential hypertension?  Does not seem to be on medication.  Normotensive.  Anxiety and depression -Continue home  Effexor  Right toe ulcer and wound MRI Foot no evidence of infection. Continue topical care. Vascular surgery recommended conservative measures.  Resolved problems Traumatic mild rhabdomyolysis in the setting of fall Metabolic acidosis Hyperkalemia Recent Enterococcus UTI  Body mass index is 26.15 kg/m.          DVT prophylaxis:  On full dose anticoagulation  Code Status: DNR/DNI Family Communication: Updated caregiver at bedside Level of care: Progressive Status is: Inpatient Remains inpatient appropriate because: Cancer related pain   Final disposition: Home with home hospice? Consultants:  Oncology, radiation oncology and palliative medicine  55 minutes with more than 50% spent in reviewing records, counseling patient/family and coordinating care.   Sch Meds:  Scheduled Meds:  Chlorhexidine Gluconate Cloth  6 each Topical Daily   enoxaparin (LOVENOX) injection  70 mg Subcutaneous Daily   fentaNYL  1 patch Transdermal Q72H   gabapentin  300 mg Oral QHS   insulin aspart  0-5 Units Subcutaneous QHS   insulin aspart  0-6 Units Subcutaneous TID WC   ipratropium  0.5 mg Nebulization Q6H WA   levalbuterol  0.63 mg Nebulization Q6H WA   lidocaine  2 patch Transdermal Q24H   LORazepam  0.5 mg Oral BID   oxybutynin  10 mg Oral Daily   senna  2 tablet Oral QHS   sodium chloride flush  10-40 mL Intracatheter Q12H   sodium chloride flush  3 mL Intravenous Q12H   venlafaxine XR  75 mg Oral Q breakfast   Continuous Infusions: PRN Meds:.acetaminophen **OR** acetaminophen, cyclobenzaprine, HYDROmorphone (DILAUDID) injection, levalbuterol, lidocaine-prilocaine, LORazepam, ondansetron **OR** ondansetron (ZOFRAN) IV, oxyCODONE, senna-docusate, sodium chloride flush  Antimicrobials: Anti-infectives (From admission, onward)    Start     Dose/Rate Route Frequency Ordered Stop   03/28/23 1000  amoxicillin (AMOXIL) capsule 250 mg  Status:  Discontinued        250 mg Oral  Every 12 hours 03/28/23 0640 03/29/23 0619        I have personally reviewed the following labs and images: CBC: Recent Labs  Lab 03/30/23 0353 03/31/23 1050 04/01/23 0459 04/02/23 0112 04/03/23 0440  WBC 6.1 6.1 6.5 5.6 5.9  NEUTROABS 4.9 5.0 5.1 4.3 4.5  HGB 9.7* 9.1* 8.7* 8.7* 8.1*  HCT 31.3* 29.5* 28.9* 28.5* 27.0*  MCV 89.9 92.5 92.3 92.2 93.1  PLT 195 181 155 150 143*   BMP &GFR Recent Labs  Lab 03/29/23 0410 03/30/23 0353 03/31/23 1050 04/01/23 0459 04/02/23 0112 04/03/23 0440 04/04/23 0329  NA 137 136 135 139 135 138 136  K 3.4* 5.0 4.9 5.2* 4.5 4.3 4.9  CL 101 100 105 103 101 104 104  CO2 21* 22 22 22 23 23  20*  GLUCOSE 69* 64* 165* 126* 136* 102* 185*  BUN 84* 77* 73* 74* 74* 62* 57*  CREATININE 4.39* 4.23* 3.81* 3.98* 3.85* 3.75* 3.83*  CALCIUM 7.8* 7.9* 8.2* 8.8* 8.3* 8.8* 9.0  MG 1.5* 2.0 1.7 1.9  --   --   --   PHOS 5.9* 5.0* 5.5* 5.7*  --   --   --    Estimated Creatinine Clearance: 15.3 mL/min (  A) (by C-G formula based on SCr of 3.83 mg/dL (H)). Liver & Pancreas: Recent Labs  Lab 03/31/23 1050 04/01/23 0459 04/02/23 0112 04/03/23 0440 04/04/23 0329  AST 18 14* 15 11* 13*  ALT 17 16 16 13 13   ALKPHOS 92 95 87 80 93  BILITOT 0.2* 0.4 0.5 0.7 0.6  PROT 5.8* 5.7* 5.8* 5.7* 6.2*  ALBUMIN 2.0* 2.1* 2.1* 2.1* 2.3*   No results for input(s): "LIPASE", "AMYLASE" in the last 168 hours. No results for input(s): "AMMONIA" in the last 168 hours. Diabetic: No results for input(s): "HGBA1C" in the last 72 hours. Recent Labs  Lab 04/03/23 0805 04/03/23 1225 04/03/23 1900 04/04/23 0745 04/04/23 1143  GLUCAP 86 138* 163* 134* 133*   Cardiac Enzymes: Recent Labs  Lab 03/29/23 0410 03/30/23 0353 04/01/23 0459  CKTOTAL 803* 416* 82   No results for input(s): "PROBNP" in the last 8760 hours. Coagulation Profile: No results for input(s): "INR", "PROTIME" in the last 168 hours. Thyroid Function Tests: No results for input(s): "TSH",  "T4TOTAL", "FREET4", "T3FREE", "THYROIDAB" in the last 72 hours. Lipid Profile: No results for input(s): "CHOL", "HDL", "LDLCALC", "TRIG", "CHOLHDL", "LDLDIRECT" in the last 72 hours. Anemia Panel: No results for input(s): "VITAMINB12", "FOLATE", "FERRITIN", "TIBC", "IRON", "RETICCTPCT" in the last 72 hours. Urine analysis:    Component Value Date/Time   COLORURINE YELLOW 03/27/2023 1818   APPEARANCEUR TURBID (A) 03/27/2023 1818   LABSPEC 1.013 03/27/2023 1818   PHURINE 5.0 03/27/2023 1818   GLUCOSEU 50 (A) 03/27/2023 1818   HGBUR LARGE (A) 03/27/2023 1818   BILIRUBINUR NEGATIVE 03/27/2023 1818   KETONESUR NEGATIVE 03/27/2023 1818   PROTEINUR 100 (A) 03/27/2023 1818   NITRITE NEGATIVE 03/27/2023 1818   LEUKOCYTESUR LARGE (A) 03/27/2023 1818   Sepsis Labs: Invalid input(s): "PROCALCITONIN", "LACTICIDVEN"  Microbiology: Recent Results (from the past 240 hour(s))  Blood Culture (routine x 2)     Status: None   Collection Time: 03/27/23 12:44 PM   Specimen: BLOOD  Result Value Ref Range Status   Specimen Description   Final    BLOOD RIGHT ANTECUBITAL Performed at [facility], [address] [place], [place] [address]    Special Requests   Final    BOTTLES DRAWN AEROBIC AND ANAEROBIC Blood Culture adequate volume Performed at [facility], [address] [place], [place] [address]    Culture   Final    NO GROWTH 5 DAYS Performed at [facility], [address], [place], [place] [address]    Report Status 04/01/2023 FINAL  Final  Blood Culture (routine x 2)     Status: None   Collection Time: 03/27/23  1:17 PM   Specimen: BLOOD  Result Value Ref Range Status   Specimen Description   Final    BLOOD LEFT ANTECUBITAL Performed at [facility], [address] [place], [place] [address]    Special Requests   Final    BOTTLES DRAWN AEROBIC ONLY Blood Culture adequate volume Performed at [facility], [address]  [place], [place] [address]    Culture   Final    NO GROWTH 5 DAYS Performed at [facility], [address], [place], [place] [address]    Report Status 04/01/2023 FINAL  Final  Urine Culture     Status: None   Collection Time: 03/27/23  6:18 PM   Specimen: Urine, Random  Result Value Ref Range Status  Specimen Description   Final    URINE, RANDOM Performed at [facility], [address], [place], [place] [address]    Special Requests   Final    NONE Reflexed from [id number] Performed at [facility], [address] [place], [place] [address]    Culture   Final    NO GROWTH Performed at [facility], [address], [place], [place] [address]    Report Status 03/28/2023 FINAL  Final    Radiology Studies: No results found.    Beatrix Breece T. Saliyah Gillin [facility]  If 7PM-7AM, please contact night-coverage [email or web address] 04/04/2023, 12:00 PM

## 2023-04-04 NOTE — Progress Notes (Signed)
Palliative:  HPI:  57 y.o. 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  Plans for chemotherapy have been on hold due to hospitalizations and to await surgery. Admitted for acute on chronic renal injury and worsening dypnea, and anemia. Palliative medicine consulted for GOC.   Discussed with HCPOA, [name]. Plans to pursue short course or radiation treatment. Already connected with [facility] to begin services when discharged after radiation completed. Minimal PRNs being utilized. No BM noted since 9/6 - confirmed with caregiver at bedside. Will escalate bowel regimen. Pain controlled.   All questions/concerns addressed. Emotional support provided.   Exam: Sleeping. Awakens and able to communicate. No distress. Sleeping most of the time. Breathing regular, unlabored. Abs soft but bloated. Moves all extremities.   Plan: - DNR.  - Home with hospice after radiation.  - Bowel Regimen: Sorbitol x1 today and daily PRN. Senokot-S 2 tabs daily.   35 min  Yong Channel, NP Palliative Medicine Team Pager [phone] (Please see [email or web address] for schedule) Team Phone [phone]

## 2023-04-04 NOTE — Progress Notes (Signed)
Radiation Oncology         [phone] ________________________________  Inpatient Re-Consultation  Name: Jaime Gomez MRN: [id number]  Date: 04/04/2023  DOB: May 27, 1966  EX:[id number], Vinetta Bergamo, MD  [name], MD   REFERRING PHYSICIAN: [name], MD  DIAGNOSIS: The encounter diagnosis was Endometrial cancer, FIGO stage IVB (HCC).   The primary encounter diagnosis was Endometrial cancer, FIGO stage IVB (HCC). A diagnosis of Cancer, metastatic to bone [facility]) was also pertinent to this visit.   Metastatic endometrial cancer: initially diagnosed in 2012 with 2 synchronous primaries of stage IC adenocarcinoma of the ovary and stage IIIC2 moderately differentiated endometrioid adenocarcinoma of the uterus              - PET showed pulmonary metastases and a new tumor involving the lower sacrum              - PET performed in January 2024 showed progression of bilateral pulmonary metastases/masses in the RUL, RLL, and LUL             - Imaging in July 2024 with a new right tibial mass consistent with metastatic endometrial cancer primary    Cancer Staging  Endometrial cancer, FIGO stage IVB (HCC) Staging form: Corpus Uteri - Carcinoma and Carcinosarcoma, AJCC 8th Edition - Clinical stage from 08/18/2021: FIGO Stage IVB (rcT3a, cNX, pM1) - Signed by [name], MD on 08/18/2021   HPI / Narrative - 04/04/23::Jaime Gomez is a 57 y.o. female who is accompanied by her sister. she is seen as a courtesy of Dr. Myna Hidalgo for an opinion concerning radiation therapy as part of management for her diagnosed right tibial mass from a metastatic endometrial cancer primary. She was last seen here for re-evaluation on 03/12/23 for consideration of radiation therapy to her right tibial mass. At that time, we discussed her risk for pathologic fracture which would significantly limit weightbearing on her right LE. In light of this, I recommended that she be evaluated by orthopedic surgery to discuss  the indication of stabilization to be performed prior to considering her for palliative radiation to the right tibial mass.   Since her last visit, the patient presented to the ED on 03/23/23 with shortness of breath, right-sided chest pain worse with deep breaths, fevers up to 102 at home, body aches, chills, nausea, congestion, and sore throat.  She also reported having an episode of bright bloody vaginal discharge, some hematuria, and a recent fall the week prior. Upon arrival to the ED, she was noted to be hypoxic stating at 89% on room air.   CT CAP without contrast performed in the ED showed evidence of pulmonary disease progression, demonstrated by: an increase in size of the central RUL mass, measuring 4.2 x 3.4 cm, previously 4.0 x 2.9 cm; an increase in size of the left perihilar mass measuring 6.8 x 5.0 cm, previously 6.2 x 4.7; and an increase in size of the RLL mass, measuring 6.8 x 6.3 cm, previously 6.6 x 4.4 cm. The cavitary lesion and adjacent peripheral mural nodule in the medial RLL also showed a very mild increase in size from 9 mm to 10 mm, favoring primary bronchogenic carcinoma (based on appearance). CT otherwise showed stability of the known thoracic osseous metastatic disease (though not well characterized on this study), and no acute traumatic injury to the chest, abdomen, or pelvis.   She was offered admission for management of AKI, UTI, and hypoxia in the setting of disease progression, however  she expressed interest in pursuing palliative care and declined admission. She was also found to have a UTI while in the ED and was started on abx. She was also started on supplemental O2.   On that same day, the patient met with Dr. Myna Hidalgo. Her CT performed in the ED was reviewed at that time and the patient again expressed interest in pursuing a palliative approach to her condition.   She was initially scheduled to have surgery to repair her right tibia on 03/27/2023. However, in light  of her ongoing UTI, surgery was delayed.   She also again presented to the ED on 03/26/23 after becoming dizzy and falling in the shower and hitting her left lateral lower ribs in the process. And also endorsed right shoulder pain. Imaging performed in the ED inclulded:  -- CT of the head and cervical spine showed no acute intracranial abnormalities and no evidence of intracranial metastatic disease.  -- CT CAP showed stable lytic lesions in the bones without evidence of acute fracture; no significant interval change in the bilateral pulmonary nodules; a slight increase in the ground glass opacities in the right lung; and stability of the ground glass opacities in the left lung. CT also redemonstrated the cavitary lesion with associated nodules in the RLL suspicious for bronchogenic carcinoma.  -- CT of the thoracic and lumbar spine also showed lytic lesion at the left inferior aspect of T12 articular facet and lamina with extension to the spinous process and causing destructin of the cortex. No acute or displaced fractures were appreciated.   The patient was again offered admission however she again declined (stated that her goal was to stay comfortable at home for the days she has left).   Current admission:   The patient presented to the [facility] ED on 03/27/23 with increasing generalized weakness, fatigue, and pain likely due to her progressive pulmonary metastases.   Upon arrival to the ED, the patient was found to be afebrile and saturating well on 2 L/min O2, mildly tachycardic, and and hypotensive (systolic down to 75). Chest x-ray performed showed stability of perihilar nodular opacities. Labs were also notable for BUN 97, creatinine 5.53, serum bicarbonate 14, glucose 317, CK 2751, normal lactic acid, normal WBC, and hemoglobin at 8.4. She was given 1.5 L of LR, fentanyl, and IV insulin in the ED and then transferred to [facility] for admission.   She was seen by Dr. Myna Hidalgo while  inpatient. Although she is interested in a palliative approach, Dr. Myna Hidalgo did discuss the option of Adriamycin, however her comorbidities/recurring problems related to her renal function make treatment difficult. Ultimately, chemotherapy will not be pursued at this time. She is however interested in proceeding with a short course radiation therapy which we will discuss in detail today.   In terms of surgery, the patient was evaluated by Dr. Jerl Santos with orthopedics on 09/08. Given her significant pain, symptoms related to disease progression, respiratory concerns, and interest in palliative care / hospice, Dr. Jerl Santos does not recommend surgery to the tibia at this time as it may do more harm than good in her condition.   Pertinent imaging performed thus far includes a CT AP without contrast on 03/30/23 (in the setting of abdominal pain) which showed: no acute intra-abdominal or pelvic pathology, bilateral ureteral stents in similar position as the prior CT, and similar appearing mild right hydronephrosis.  Of note: The patient was found to have a toe ulcer and concern for a soft tissue infection  was noted. She subsequently had a MRI  of the right forefoot performed on 09/06 which showed a possible subacute fracture of the 5th metatarsal neck, and no evidence of soft tissue abscess, osteomyelitis, or metastatic disease in the right foot.  She continues to have significant pain in the right lower leg despite Durogesic pain medication.   HPI - Re-Consultation 03/12/23::Jaime Gomez is a 57 y.o. female who is seen as a courtesy of Dr. Myna Hidalgo for an opinion concerning radiation therapy as part of management for her recently diagnosed right tibial mas likely from a metastatic endometrial cancer primary (biopsy proven). She was last seen for her final radiation treatment to the right lung on 10/23/22. She opted to discontinue radiation early due to feeling overwhelmed at the time. She otherwise tolerated  her previous course of radiation therapy well other than fatigue.    Since completing radiation therapy, the patient followed up with Dr. Myna Hidalgo on 11/16/22. Labs reviewed at that time showed low hemoglobin down to 5, and CA-125 up to 581. In light of this and her progressive fatigue/tiredness, Femara and Afinitor were subsequently discontinued. In light of her multiple comorbidities, Dr. Myna Hidalgo held off on starting her on a second line systemic treatment at that time.     She was then hospitalized from 11/28/22 through 12/12/22 with sepsis secondary to UTI, CVA, cardiomyopathy, encephalopathy and chronic RLE DVT. Her hospital course included percutaneous tube exchange on 05/08 and port removal on 05/13. Her hospital course was also complicated by acute kidney injury.    In the setting of her bilateral hydronephrosis, the patient underwent cystoscopy, right retrograde pyelogram, right ureteral stent exchange (along with a botox injection into the bladder) on 01/12/2023 under the care of Dr. Cardell Peach.    For restaging of her disease, the patient presented for a PET scan on 02/07/23 which showed a mixed response to interval therapy, demonstrated by: an increase in size and hypermetabolism of the left perihilar lung mass; an interval decrease in hypermetabolism of the dominant right lung lesions; and new hypermetabolic osseous metastases involving the left scapula, the posterior elements at T12, and the left 9th rib. PET otherwise showed no evidence of metastatic disease within the abdomen or pelvis.    She was then hospitalized the following day from 02/08/23 through 02/10/23 with acute DVT, presumed PE, and possible pneumonia. Upon presentation, she endorsed left-sided flank pain and axillary chest pain. Hospital course included empiric antibiotics and her anticoagulation was changed to enoxaparin. Imaging performed while inpatient includes:  -- Chest CT on 07/18 showed an increase in size of the left  perihilar masses since her PET scan performed in January 2024. CT also showed an increase in size of the cystic lesion of the right lower lobe when compared to June 2024, and several new bilateral solid pulmonary nodules when compared to her PET scan performed in January 2024. No evidence of lymphadenopathy was demonstrated in the chest otherwise.  -- CT of the abdomen and pelvis on 07/18 an increase in size of the medial RLL nodule, a new 6 mm nodule inferior to the RLL nodule, as well as the previously seen new mass-like consolidation involving the left hilum and RLL.  -- Bilateral LE venous ultrasound on 07/18 showed evidence of DVT involving the right common femoral, profunda femoral, femoral and popliteal veins, and the left femoral and popliteal veins. A heterogeneous 2.7 x 4.1 x 2.3 cm mass in the upper right calf with internal vascularity was also demonstrated (see MRI  below).  -- MRI of the right lower extremity on 07/19 demonstrated: a complex subperiosteal mass along the posteromedial aspect of the proximal right tibial metaphysis, corresponding with the mass seen on her ultrasound noted above; and subsequent/resulting erosion of the adjacent tibial cortex with adjacent marrow edema and periosteal edema. No other soft tissue masses or fluid collections were identified.    Per Dr. Gustavo Lah recommendation, the patient opted to proceed with biopsy of the right tibial mass on 03/02/23. Pathology revealed metastatic carcinoma, consistent with metastatic endometrioid carcinoma   Systemic treatment (if any) will depend on molecular testing results. She is also struggling with hyperglycemia and will need to get this under control before she can be considered for any form of chemotherapy .   Regardless of biopsy results, Dr. Myna Hidalgo recommends proceeding with radiation to the right tibial mass for local pain control which we will discuss in detail today   Of note: The patient presented to the ED after  suffering a fall while in the radiology department. According to her caretaker, the patient slipped by tripping with her cane while being guided to the registration area. She did not strike her head and there was no loss of consciousness. A CT of the head and cervical spine were performed and were both negative for any acute trauma.    PREVIOUS RADIATION THERAPY: Yes    2) Indication for treatment: Curative       Radiation treatment dates: 10/12/22 through 10/23/22  Site/dose: Right lung - 25 Gy delivered in 5 Fx at 5 Gy/Fx  (Initially prescribed 50 Gy to be delivered in 10 Fx at 5 Gy/Fx) Beams/energy:  6X-FFF Technique/Mode: IMRT / Photon   1) Diagnosis: Metastatic endometrial cancer - tumor involving the lower sacrum   Intent: Palliative Radiation Treatment Dates: 01/26/2022 through 02/09/2022 Site Technique Total Dose (Gy) Dose per Fx (Gy) Completed Fx Beam Energies  Lumbar Spine: Spine_Sacral IMRT 30/30 3 10/10 6X    PAST MEDICAL HISTORY:  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/2021   Ovarian cancer (HCC) 2013   Pneumonia 12/03/2022   Stroke (HCC)    Wears glasses     PAST SURGICAL HISTORY: Past Surgical History:  Procedure Laterality Date   ABDOMINAL HYSTERECTOMY     BOTOX INJECTION N/A 01/12/2023   Procedure: BOTOX INJECTION INTO BLADDER;  Surgeon: Jannifer Hick, MD;  Location: [facility];  Service: Urology;  Laterality: N/A;   CYSTOSCOPY W/ RETROGRADES Right 01/12/2023   Procedure: CYSTOSCOPY WITH RIGHT  RETROGRADE PYELOGRAM/ RIGHT URETERAL STENT CHANGE;  Surgeon: Jannifer Hick,  MD;  Location: [facility];  Service: Urology;  Laterality: Right;   CYSTOSCOPY W/ URETERAL STENT PLACEMENT Bilateral 09/01/2021   Procedure: CYSTOSCOPY WITH RETROGRADE PYELOGRAM/URETERAL STENT PLACEMENT;  Surgeon: Jannifer Hick, MD;  Location: [facility];  Service: Urology;  Laterality: Bilateral;   CYSTOSCOPY WITH FULGERATION N/A 01/12/2023   Procedure: CYSTOSCOPY WITH FULGERATION;  Surgeon: Jannifer Hick, MD;  Location: [facility];  Service: Urology;  Laterality: N/A;   CYSTOSCOPY WITH STENT PLACEMENT Right 05/19/2022  Procedure: CYSTOSCOPY WITH STENT CHANGE;  Surgeon: Jannifer Hick, MD;  Location: [facility];  Service: Urology;  Laterality: Right;  ONLY NEEDS 30 MIN   HERNIA REPAIR     IR CONVERT LEFT NEPHROSTOMY TO NEPHROURETERAL CATH  11/02/2021   IR CONVERT LEFT NEPHROSTOMY TO NEPHROURETERAL CATH  01/09/2023   IR CV LINE INJECTION  08/23/2021   IR DIL URETER LEFT  01/09/2023   IR IMAGING GUIDED PORT INSERTION  11/14/2021   IR IMAGING GUIDED PORT INSERTION  01/09/2023   IR NEPHROSTOMY EXCHANGE LEFT  11/02/2021   IR NEPHROSTOMY EXCHANGE LEFT  01/20/2022   IR NEPHROSTOMY EXCHANGE LEFT  11/29/2022   IR NEPHROSTOMY PLACEMENT LEFT  09/03/2021   IR REMOVAL TUN ACCESS W/ PORT W/O FL MOD SED  11/14/2021   IR REMOVAL TUN ACCESS W/ PORT W/O FL MOD SED  12/04/2022   IR URETERAL STENT PLACEMENT EXISTING ACCESS LEFT  02/28/2023    FAMILY HISTORY:  Family History  Problem Relation Age of Onset   Hyperlipidemia Mother    Heart disease Mother    Diabetes Mother    Depression Mother    COPD Mother    Arthritis Mother    Heart attack Mother    Drug abuse Father    Alcohol abuse Father    Hyperlipidemia Sister    Heart disease Sister    Diabetes Sister    Arthritis Sister    Miscarriages / [place] Sister    Depression Brother     SOCIAL HISTORY:  Social History   Tobacco Use   Smoking status: Never   Smokeless tobacco: Never  Vaping Use   Vaping status: Never Used   Substance Use Topics   Alcohol use: Never   Drug use: Never    ALLERGIES:  Allergies  Allergen Reactions   Paclitaxel Anaphylaxis, Swelling and Other (See Comments)    Made everything swell   Taxotere [Docetaxel] Anaphylaxis, Swelling and Other (See Comments)    Throat swelling    MEDICATIONS:  No current facility-administered medications for this encounter.   No current outpatient medications on file.   Facility-Administered Medications Ordered in Other Encounters  Medication Dose Route Frequency Provider Last Rate Last Admin   acetaminophen (TYLENOL) tablet 650 mg  650 mg Oral Q6H PRN Opyd, Lavone Neri, MD   650 mg at 03/29/23 1511   Or   acetaminophen (TYLENOL) suppository 650 mg  650 mg Rectal Q6H PRN Opyd, Lavone Neri, MD       Chlorhexidine Gluconate Cloth 2 % PADS 6 each  6 each Topical Daily Marguerita Merles [place], DO   6 each at 04/04/23 1312   cyclobenzaprine (FLEXERIL) tablet 5 mg  5 mg Oral TID PRN Barbara Cower, NP       enoxaparin (LOVENOX) injection 70 mg  70 mg Subcutaneous Daily Rolly Salter, MD   70 mg at 04/04/23 1311   fentaNYL (DURAGESIC) 12 MCG/HR 1 patch  1 patch Transdermal Q72H [name], MD   1 patch at 04/03/23 0951   gabapentin (NEURONTIN) capsule 300 mg  300 mg Oral QHS Opyd, Lavone Neri, MD   300 mg at 04/03/23 2224   HYDROmorphone (DILAUDID) injection 0.25 mg  0.25 mg Intravenous Q3H PRN Barbara Cower, NP       insulin aspart (novoLOG) injection 0-5 Units  0-5 Units Subcutaneous QHS Opyd, Lavone Neri, MD   3 Units at 03/29/23 2223   insulin aspart (novoLOG) injection 0-6 Units  0-6 Units Subcutaneous  TID WC Opyd, Lavone Neri, MD   1 Units at 04/01/23 1235   ipratropium (ATROVENT) nebulizer solution 0.5 mg  0.5 mg Nebulization Q6H WA Rolly Salter, MD   0.5 mg at 04/04/23 1505   levalbuterol (XOPENEX) nebulizer solution 0.63 mg  0.63 mg Nebulization Q6H WA Rolly Salter, MD   0.63 mg at 04/04/23 1505   levalbuterol (XOPENEX) nebulizer solution 0.63  mg  0.63 mg Nebulization Q8H PRN Rolly Salter, MD       lidocaine (LIDODERM) 5 % 2 patch  2 patch Transdermal Q24H Rolly Salter, MD   2 patch at 04/04/23 1519   lidocaine-prilocaine (EMLA) cream   Topical PRN Marland Mcalpine, Kateri Mc Latif, DO       LORazepam (ATIVAN) tablet 0.5 mg  0.5 mg Oral BID Barbara Cower, NP   0.5 mg at 04/04/23 1000   LORazepam (ATIVAN) tablet 0.5 mg  0.5 mg Oral Q8H PRN Barbara Cower, NP       ondansetron (ZOFRAN) tablet 4 mg  4 mg Oral Q6H PRN Opyd, Lavone Neri, MD       Or   ondansetron (ZOFRAN) injection 4 mg  4 mg Intravenous Q6H PRN Opyd, Lavone Neri, MD       oxybutynin (DITROPAN-XL) 24 hr tablet 10 mg  10 mg Oral Daily Opyd, Lavone Neri, MD   10 mg at 04/04/23 1000   oxyCODONE (Oxy IR/ROXICODONE) immediate release tablet 10 mg  10 mg Oral Q4H PRN Ulice Bold, NP   10 mg at 04/04/23 1610   senna-docusate (Senokot-S) tablet 2 tablet  2 tablet Oral QHS Ulice Bold, NP       sodium chloride flush (NS) 0.9 % injection 10-40 mL  10-40 mL Intracatheter Q12H Sheikh, Omair [place], DO   10 mL at 04/02/23 1015   sodium chloride flush (NS) 0.9 % injection 10-40 mL  10-40 mL Intracatheter PRN Sheikh, Omair Latif, DO       sodium chloride flush (NS) 0.9 % injection 3 mL  3 mL Intravenous Q12H Opyd, Lavone Neri, MD   3 mL at 04/03/23 2200   [START ON 04/05/2023] sorbitol 70 % solution 30 mL  30 mL Oral Daily PRN Ulice Bold, NP       venlafaxine XR (EFFEXOR-XR) 24 hr capsule 75 mg  75 mg Oral Q breakfast Barbara Cower, NP   75 mg at 04/04/23 9604    REVIEW OF SYSTEMS:  A 10+ POINT REVIEW OF SYSTEMS WAS OBTAINED including neurology, dermatology, psychiatry, cardiac, respiratory, lymph, extremities, GI, GU, musculoskeletal, constitutional, reproductive, HEENT.    PHYSICAL EXAM:  General: Alert and oriented, in no acute distress, lying comfortably in her hospital bed HEENT: Head is normocephalic. Extraocular movements are intact. Oropharynx is clear. Neck: Neck is supple, no  palpable cervical or supraclavicular lymphadenopathy. Heart: Regular in rate and rhythm with no murmurs, rubs, or gallops. Chest: Clear to auscultation bilaterally, with no rhonchi, wheezes, or rales. Abdomen: Soft, nontender, nondistended, with no rigidity or guarding. Extremities: Some edema noted to the right lower extremity.  Swelling noted in the area of concern in the upper calf region with some tenderness with palpation in this area.  No skin erosion or signs of infection. Lymphatics: see Neck Exam Skin: No concerning lesions. Musculoskeletal: symmetric strength and muscle tone throughout. Neurologic: Cranial nerves II through XII are grossly intact. No obvious focalities. Speech is fluent. Coordination is intact. Psychiatric: Judgment and insight are intact. Affect is  appropriate.   ECOG = 3  0 - Asymptomatic (Fully active, able to carry on all predisease activities without restriction)  1 - Symptomatic but completely ambulatory (Restricted in physically strenuous activity but ambulatory and able to carry out work of a light or sedentary nature. For example, light housework, office work)  2 - Symptomatic, <50% in bed during the day (Ambulatory and capable of all self care but unable to carry out any work activities. Up and about more than 50% of waking hours)  3 - Symptomatic, >50% in bed, but not bedbound (Capable of only limited self-care, confined to bed or chair 50% or more of waking hours)  4 - Bedbound (Completely disabled. Cannot carry on any self-care. Totally confined to bed or chair)  5 - Death   Santiago Glad MM, Creech RH, Tormey DC, et al. [phone]). "Toxicity and response criteria of the [facility]". Am. Evlyn Clines. Oncol. 5 (6): 649-55  LABORATORY DATA:  Lab Results  Component Value Date   WBC 5.9 04/03/2023   HGB 8.1 (L) 04/03/2023   HCT 27.0 (L) 04/03/2023   MCV 93.1 04/03/2023   PLT 143 (L) 04/03/2023   NEUTROABS 4.5 04/03/2023   Lab Results   Component Value Date   NA 136 04/04/2023   K 4.9 04/04/2023   CL 104 04/04/2023   CO2 20 (L) 04/04/2023   GLUCOSE 185 (H) 04/04/2023   BUN 57 (H) 04/04/2023   CREATININE 3.83 (H) 04/04/2023   CALCIUM 9.0 04/04/2023      RADIOGRAPHY: DG CHEST PORT 1 VIEW  Result Date: 04/02/2023 CLINICAL DATA:  Shortness of breath EXAM: PORTABLE CHEST 1 VIEW COMPARISON:  03/29/2023 FINDINGS: Unchanged right more than left irregular pulmonary opacification with low lung volumes, reference recent chest CT. Normal heart size. No visible effusion or pneumothorax. Porta catheter on the right with tip at the SVC. IVC filter and bilateral renal stent. IMPRESSION: Bilateral pulmonary opacification primarily related to pulmonary nodules and masses. No new or progressive finding. Electronically Signed   By: Tiburcio Pea M.D.   On: 04/02/2023 22:01   VAS [place] ABI WITH/WO TBI  Result Date: 03/31/2023  LOWER EXTREMITY DOPPLER STUDY Patient Name:  Jaime Gomez  Date of Exam:   03/30/2023 Medical Rec #: [id number]     Accession #:    [id number] Date of Birth: 08-09-1965     Patient Gender: F Patient Age:   22 years Exam Location:  [facility] Procedure:      VAS [place] ABI WITH/WO TBI Referring Phys: Marguerita Merles --------------------------------------------------------------------------------  Indications: Parasthesia High Risk Factors: Diabetes.  Limitations: Today's exam was limited due to an open wound and bandages. Comparison Study: No prior studies. Performing Technologist: Olen Cordial RVT  Examination Guidelines: A complete evaluation includes at minimum, Doppler waveform signals and systolic blood pressure reading at the level of bilateral brachial, anterior tibial, and posterior tibial arteries, when vessel segments are accessible. Bilateral testing is considered an integral part of a complete examination. Photoelectric Plethysmograph (PPG) waveforms and toe systolic pressure readings are included as required and  additional duplex testing as needed. Limited examinations for reoccurring indications may be performed as noted.  ABI Findings: +---------+------------------+-----+-----------+--------+ Right    Rt Pressure (mmHg)IndexWaveform   Comment  +---------+------------------+-----+-----------+--------+ Brachial 116                    triphasic           +---------+------------------+-----+-----------+--------+ PTA      252  2.15 multiphasic         +---------+------------------+-----+-----------+--------+ DP       254               2.17 multiphasic         +---------+------------------+-----+-----------+--------+ Great Toe                                  Wound    +---------+------------------+-----+-----------+--------+ +---------+------------------+-----+-----------+-------+ Left     Lt Pressure (mmHg)IndexWaveform   Comment +---------+------------------+-----+-----------+-------+ Brachial 117                    triphasic          +---------+------------------+-----+-----------+-------+ PTA      254               2.17 multiphasic        +---------+------------------+-----+-----------+-------+ DP       254               2.17 multiphasic        +---------+------------------+-----+-----------+-------+ Great Toe216               1.85                    +---------+------------------+-----+-----------+-------+ +-------+-----------+-----------+------------+------------+ ABI/TBIToday's ABIToday's TBIPrevious ABIPrevious TBI +-------+-----------+-----------+------------+------------+ Right  [place]                                             +-------+-----------+-----------+------------+------------+ Left   [place]         [place]                                  +-------+-----------+-----------+------------+------------+  Summary: Right: Resting right ankle-brachial index indicates noncompressible right lower extremity arteries. Unable to obtain TBI due  to great toe open wound/bandages. Left: Resting left ankle-brachial index indicates noncompressible left lower extremity arteries. The left toe-brachial index is abnormal. *See table(s) above for measurements and observations.  Electronically signed by Coral Else MD on 03/31/2023 at 7:30:55 PM.    Final    CT ABDOMEN PELVIS WO CONTRAST  Result Date: 03/30/2023 CLINICAL DATA:  Abdominal pain.  Metastatic endometrial cancer. EXAM: CT ABDOMEN AND PELVIS WITHOUT CONTRAST TECHNIQUE: Multidetector CT imaging of the abdomen and pelvis was performed following the standard protocol without IV contrast. RADIATION DOSE REDUCTION: This exam was performed according to the departmental dose-optimization program which includes automated exposure control, adjustment of the mA and/or kV according to patient size and/or use of iterative reconstruction technique. COMPARISON:  CT of the chest abdomen pelvis dated 03/26/2023. FINDINGS: Evaluation of this exam is limited in the absence of intravenous contrast. Lower chest: Partially visualized bilateral pulmonary masses and patchy hazy densities as seen on the prior CT. The tip of a central venous line noted at the cavoatrial junction. There is advanced 3 vessel coronary vascular calcification. No intra-abdominal free air or free fluid. Hepatobiliary: The liver is unremarkable. No biliary dilatation. Noncalcified gallstone. No pericholecystic fluid or evidence of acute cholecystitis by CT. Pancreas: Unremarkable. No pancreatic ductal dilatation or surrounding inflammatory changes. Spleen: No acute findings.  Upper pole scarring. Adrenals/Urinary Tract: The adrenal glands are unremarkable. Bilateral ureteral stents in similar position. Renal vascular calcifications noted bilaterally. Nonobstructing bilateral  renal calculi may be present. There is similar appearance of mild right hydronephrosis. The urinary bladder is grossly unremarkable. Stomach/Bowel: Mild sigmoid diverticulosis  without active inflammatory changes. There is no bowel obstruction or active inflammation. The appendix is normal. Vascular/Lymphatic: Mild aortoiliac atherosclerotic disease. An infrarenal IVC filter is noted. No portal venous gas. There is no adenopathy. Reproductive: Hysterectomy.  No adnexal masses. Other: Midline vertical anterior pelvic wall incisional scar. A ventral hernia repair mesh is noted. There is abutment of several loops of small bowel to the hernia repair mesh suggestive of adhesions. Musculoskeletal: Osteopenia with mild degenerative changes. No acute osseous pathology. IMPRESSION: 1. No acute intra-abdominal or pelvic pathology. 2. Bilateral ureteral stents in similar position as the prior CT. Similar appearance of mild right hydronephrosis. 3. Cholelithiasis. 4. Mild sigmoid diverticulosis. No bowel obstruction. Normal appendix. 5.  Aortic Atherosclerosis (ICD10-I70.0). Electronically Signed   By: Elgie Collard M.D.   On: 03/30/2023 15:12   MR FOOT RIGHT WO CONTRAST  Result Date: 03/30/2023 CLINICAL DATA:  Soft tissue infection suspected, foot. Tibial metastasis. Reported toe ulcer. EXAM: MRI OF THE RIGHT FOREFOOT WITHOUT CONTRAST TECHNIQUE: Multiplanar, multisequence MR imaging of the right forefoot was performed. No intravenous contrast was administered. COMPARISON:  Limited correlation made with previous MRI of the right lower leg 02/09/2023 which does not include the forefoot. No comparison radiographs. FINDINGS: Bones/Joint/Cartilage The site of the reported toe ulcer is not indicated and is not obvious to me. There is heterogeneous fat saturation on the T2 weighted images through the toes. No suspicious osseous findings are identified within the toes on the T1 weighted or inversion recovery images. There appears to be a subacute fracture of the 5th metatarsal neck with associated marrow edema. No cortical destruction identified. No other fractures are seen. There are degenerative  changes at the 1st metatarsophalangeal joint. Mild midfoot degenerative changes are noted. No significant joint effusions. Ligaments Intact Lisfranc ligament. The collateral ligaments of the metatarsophalangeal joints appear intact. Muscles and Tendons Generalized muscular T2 hyperintensity, likely related to diabetes. No evidence of forefoot tendon tear or significant tenosynovitis. Soft tissues No obvious skin ulcer identified in the toes. There is no evidence of soft tissue abscess or metastatic disease. Mild nonspecific dorsal subcutaneous edema without focal fluid collection. IMPRESSION: 1. No evidence of soft tissue abscess or osteomyelitis in the right forefoot. 2. Suspected subacute fracture of the 5th metatarsal neck. Recommend plain film and clinical correlation. 3. Mild degenerative changes as described. 4. No evidence of metastatic disease in the foot. Electronically Signed   By: Carey Bullocks M.D.   On: 03/30/2023 13:25   DG Abd 1 View  Result Date: 03/29/2023 CLINICAL DATA:  Abdominal pain. EXAM: ABDOMEN - 1 VIEW COMPARISON:  CT 03/26/2023 FINDINGS: Bilateral ureteral stents in place. IVC filter in place. Prior abdominal hernia repair with tacks. No bowel dilatation or obstruction. Small volume of stool in the colon. IMPRESSION: Normal bowel gas pattern. Bilateral ureteral stents in place. Electronically Signed   By: Narda Rutherford M.D.   On: 03/29/2023 21:54   DG CHEST PORT 1 VIEW  Result Date: 03/29/2023 CLINICAL DATA:  Shortness of breath EXAM: PORTABLE CHEST 1 VIEW COMPARISON:  CXR 03/27/23 FINDINGS: Right-sided needle access chest port in place. Pleural effusion. No pneumothorax. Unchanged cardiac and mediastinal contours. Unchanged airspace opacities in the right-greater-than-left perihilar region. No new focal airspace opacity. No radiographically apparent displaced rib fracture. Visualized upper abdomen is unremarkable. IMPRESSION: Unchanged airspace opacities in the  right-greater-than-left perihilar region.  No new focal airspace opacity. Electronically Signed   By: Lorenza Cambridge M.D.   On: 03/29/2023 12:33   DG Chest Port 1 View  Result Date: 03/27/2023 CLINICAL DATA:  Sepsis.  Fall yesterday. EXAM: PORTABLE CHEST 1 VIEW COMPARISON:  X-ray 02/20/2023. CT scan chest abdomen pelvis 03/26/2023 FINDINGS: Right IJ chest port with tip at the central SVC. Underinflation. No pneumothorax, effusion. Overlapping cardiac leads. Stable cardiopericardial silhouette. Perihilar nodular contour opacities are once again identified, similar to the previous CT scan. Mild adjacent ground-glass interstitial type changes. IMPRESSION: Stable perihilar nodular opacities. Please correlate with the prior CT. No pneumothorax or effusion. Chest port Electronically Signed   By: Karen Kays M.D.   On: 03/27/2023 14:52   CT CHEST ABDOMEN PELVIS WO CONTRAST  Result Date: 03/26/2023 CLINICAL DATA:  Polytrauma, blunt. Fall. Metastatic endometrial cancer with bone metastasis. EXAM: CT CHEST, ABDOMEN AND PELVIS WITHOUT CONTRAST TECHNIQUE: Multidetector CT imaging of the chest, abdomen and pelvis was performed following the standard protocol without IV contrast. RADIATION DOSE REDUCTION: This exam was performed according to the departmental dose-optimization program which includes automated exposure control, adjustment of the mA and/or kV according to patient size and/or use of iterative reconstruction technique. COMPARISON:  03/23/2023. FINDINGS: CT CHEST FINDINGS Cardiovascular: Heart is normal in size and there is a trace pericardial effusion. Multi-vessel coronary artery calcifications are noted. A right chest port terminates at the cavoatrial junction. There is atherosclerotic calcification of the aorta without evidence of aneurysm. The pulmonary trunk is normal in caliber. Mediastinum/Nodes: No mediastinal or axillary lymphadenopathy. Evaluation of the hila is limited due to lack of IV contrast. The  thyroid gland, trachea, and esophagus are within normal limits. Lungs/Pleura: Increased scattered ground-glass opacities are noted in the right lung. Stable ground-glass opacities are present in the left lung. There is redemonstration of masses in the right upper lobe measuring 4.5 x 3.9 cm, not significantly changed when differences in technique are taken into consideration. There is a mass in the right lower lobe measuring 6.0 x 4.9 cm, axial image 90. There is a mass in the left upper lobe measuring 5.4 x 4.9 cm, axial image 85. A stable 1.2 cm nodule is present in the right lower lobe seen within a cavitary structure, axial image 111. Additional smaller nodular opacities are seen bilaterally and unchanged. No effusion or pneumothorax is seen. Musculoskeletal: Degenerative changes are present in the thoracic spine. No acute fracture is seen. A stable lytic region is noted in the medial border of the left scapula. There is a lytic lesion in the tenth rib on the left, unchanged. CT ABDOMEN PELVIS FINDINGS Hepatobiliary: No focal liver abnormality is seen. A stone is present within the gallbladder. No biliary ductal dilatation. Pancreas: Unremarkable. No pancreatic ductal dilatation or surrounding inflammatory changes. Spleen: Normal in size without focal abnormality. Adrenals/Urinary Tract: No adrenal nodule or mass. Left renal atrophy is noted. Renal calculi are noted bilaterally. There is a left ureteral stent in place without evidence of obstructive uropathy. There is a right ureteral stent in place with mild hydroureteronephrosis. No obstructing stone is seen. The bladder is unremarkable. Stomach/Bowel: Stomach is within normal limits. Appendix appears normal. No evidence of bowel wall thickening, distention, or inflammatory changes. No free air or pneumatosis. Scattered diverticula are present along the colon without evidence of diverticulitis. Vascular/Lymphatic: An IVC filter is noted. Aortic  atherosclerosis. No abdominal or pelvic lymphadenopathy. Reproductive: Status post hysterectomy. No adnexal masses. Other: No abdominopelvic ascites. Musculoskeletal: Degenerative changes are  present in the lumbar spine. No acute osseous abnormality is seen. IMPRESSION: 1. Stable lytic lesions in the bones without evidence of acute fracture. 2. Bilateral pulmonary masses and nodules, not significantly changed when differences in technique are taken into consideration. Increased ground-glass opacities are present in the right lung and stable ground-glass opacities are noted in the left lung, possibly representing metastatic disease. Clinical correlation is recommended to exclude superimposed infection. 3. Cavitary lesion with nodules in the right lower lobe, suspicious for bronchogenic carcinoma. 4. Bilateral nephrolithiasis with ureteral stents in place. There is mild-to-moderate hydroureteronephrosis on the right with no evidence of obstructing stone. Electronically Signed   By: Thornell Sartorius M.D.   On: 03/26/2023 04:19   CT L-SPINE NO CHARGE  Result Date: 03/26/2023 CLINICAL DATA:  fall. Pt. Is a cancer patient, with mets to the bone found recently. Pt. Did not lose consciousness or hit her head. She does take blood thinners. Pt. C/o L side pain. EXAM: CT THORACIC AND LUMBAR SPINE WITHOUT CONTRAST TECHNIQUE: Multidetector CT imaging of the thoracic and lumbar spine was performed without contrast. Multiplanar CT image reconstructions were also generated. RADIATION DOSE REDUCTION: This exam was performed according to the departmental dose-optimization program which includes automated exposure control, adjustment of the mA and/or kV according to patient size and/or use of iterative reconstruction technique. COMPARISON:  None Available. FINDINGS: CT THORACIC SPINE FINDINGS Alignment: Normal. Vertebrae: Multilevel mild-to-moderate degenerative changes of the spine. No associated severe osseous neural foraminal or  central canal stenosis. Lytic lesion of the T12 left inferior articular facet and lamina with extension to the spinous process. Destruction of the cortex. Paraspinal and other soft tissues: Negative. Disc levels: Mild multilevel intervertebral disc space narrowing. CT LUMBAR SPINE FINDINGS Segmentation: 5 lumbar type vertebrae. Alignment: Normal. Vertebrae: L5-S1 posterior disc osteophyte complex formation. No associated severe osseous neural foraminal or central canal stenosis. No acute fracture or focal pathologic process. Paraspinal and other soft tissues: Negative. Disc levels: Maintained. Other: Atherosclerotic plaque. Right chest wall Port-A-Cath. Inferior vena cava filter. Bilateral ureteral stents. Question lytic lesion of the S5 level. Please see separately dictated CT chest abdomen pelvis 03/26/2023. IMPRESSION: CT THORACIC SPINE IMPRESSION 1. Lytic lesion of the T12 left inferior articular facet and lamina with extension to the spinous process. Destruction of the cortex. Finding consistent with metastatic disease as noted in the history. 2. No acute displaced fracture or traumatic listhesis of the thoracic spine. CT LUMBAR SPINE IMPRESSION 1. No acute displaced fracture or traumatic listhesis of the lumbar spine. Other imaging findings of potential clinical significance: 1. Lung disease. 2. Question lytic lesion of the S5 level. 3. Aortic Atherosclerosis (ICD10-I70.0). 4. Please see separately dictated CT chest abdomen pelvis 03/26/2023. Electronically Signed   By: Tish Frederickson M.D.   On: 03/26/2023 04:06   CT T-SPINE NO CHARGE  Result Date: 03/26/2023 CLINICAL DATA:  fall. Pt. Is a cancer patient, with mets to the bone found recently. Pt. Did not lose consciousness or hit her head. She does take blood thinners. Pt. C/o L side pain. EXAM: CT THORACIC AND LUMBAR SPINE WITHOUT CONTRAST TECHNIQUE: Multidetector CT imaging of the thoracic and lumbar spine was performed without contrast. Multiplanar CT  image reconstructions were also generated. RADIATION DOSE REDUCTION: This exam was performed according to the departmental dose-optimization program which includes automated exposure control, adjustment of the mA and/or kV according to patient size and/or use of iterative reconstruction technique. COMPARISON:  None Available. FINDINGS: CT THORACIC SPINE FINDINGS Alignment: Normal. Vertebrae:  Multilevel mild-to-moderate degenerative changes of the spine. No associated severe osseous neural foraminal or central canal stenosis. Lytic lesion of the T12 left inferior articular facet and lamina with extension to the spinous process. Destruction of the cortex. Paraspinal and other soft tissues: Negative. Disc levels: Mild multilevel intervertebral disc space narrowing. CT LUMBAR SPINE FINDINGS Segmentation: 5 lumbar type vertebrae. Alignment: Normal. Vertebrae: L5-S1 posterior disc osteophyte complex formation. No associated severe osseous neural foraminal or central canal stenosis. No acute fracture or focal pathologic process. Paraspinal and other soft tissues: Negative. Disc levels: Maintained. Other: Atherosclerotic plaque. Right chest wall Port-A-Cath. Inferior vena cava filter. Bilateral ureteral stents. Question lytic lesion of the S5 level. Please see separately dictated CT chest abdomen pelvis 03/26/2023. IMPRESSION: CT THORACIC SPINE IMPRESSION 1. Lytic lesion of the T12 left inferior articular facet and lamina with extension to the spinous process. Destruction of the cortex. Finding consistent with metastatic disease as noted in the history. 2. No acute displaced fracture or traumatic listhesis of the thoracic spine. CT LUMBAR SPINE IMPRESSION 1. No acute displaced fracture or traumatic listhesis of the lumbar spine. Other imaging findings of potential clinical significance: 1. Lung disease. 2. Question lytic lesion of the S5 level. 3. Aortic Atherosclerosis (ICD10-I70.0). 4. Please see separately dictated CT  chest abdomen pelvis 03/26/2023. Electronically Signed   By: Tish Frederickson M.D.   On: 03/26/2023 04:06   CT HEAD WO CONTRAST  Result Date: 03/26/2023 CLINICAL DATA:  Head trauma, moderate-severe; Polytrauma, blunt EXAM: CT HEAD WITHOUT CONTRAST CT CERVICAL SPINE WITHOUT CONTRAST TECHNIQUE: Multidetector CT imaging of the head and cervical spine was performed following the standard protocol without intravenous contrast. Multiplanar CT image reconstructions of the cervical spine were also generated. RADIATION DOSE REDUCTION: This exam was performed according to the departmental dose-optimization program which includes automated exposure control, adjustment of the mA and/or kV according to patient size and/or use of iterative reconstruction technique. COMPARISON:  CT head 02/20/2023 FINDINGS: CT HEAD FINDINGS Brain: Patchy and confluent areas of decreased attenuation are noted throughout the deep and periventricular white matter of the cerebral hemispheres bilaterally, compatible with chronic microvascular ischemic disease. Left parietal central semiovale encephalomalacia. No evidence of large-territorial acute infarction. No parenchymal hemorrhage. No mass lesion. No extra-axial collection. No mass effect or midline shift. No hydrocephalus. Basilar cisterns are patent. Vascular: No hyperdense vessel. Atherosclerotic calcifications are present within the cavernous internal carotid and vertebral arteries. Skull: No acute fracture or focal lesion. Sinuses/Orbits: Paranasal sinuses and mastoid air cells are clear. The orbits are unremarkable. Other: None. CT CERVICAL SPINE FINDINGS Alignment: Normal. Skull base and vertebrae: Multilevel mild-to-moderate degenerative changes of the spine with posterior disc osteophyte complex formation at the C3-C4 and C5-C6 levels. No associated severe osseous neural foraminal or central canal stenosis. No acute fracture. No aggressive appearing focal osseous lesion or focal  pathologic process. Soft tissues and spinal canal: No prevertebral fluid or swelling. No visible canal hematoma. Upper chest: Biapical ground-glass and consolidative peribronchovascular airspace opacities. No apical pneumothoraces. Other: Partially visualized right internal jugular central venous catheter. IMPRESSION: 1. No acute intracranial abnormality. 2. No acute displaced fracture or traumatic listhesis of the cervical spine. 3. Biapical ground-glass and consolidative peribronchovascular airspace opacities. Please see separately dictated CT chest 03/26/2023. Electronically Signed   By: Tish Frederickson M.D.   On: 03/26/2023 03:57   CT CERVICAL SPINE WO CONTRAST  Result Date: 03/26/2023 CLINICAL DATA:  Head trauma, moderate-severe; Polytrauma, blunt EXAM: CT HEAD WITHOUT CONTRAST CT CERVICAL SPINE WITHOUT  CONTRAST TECHNIQUE: Multidetector CT imaging of the head and cervical spine was performed following the standard protocol without intravenous contrast. Multiplanar CT image reconstructions of the cervical spine were also generated. RADIATION DOSE REDUCTION: This exam was performed according to the departmental dose-optimization program which includes automated exposure control, adjustment of the mA and/or kV according to patient size and/or use of iterative reconstruction technique. COMPARISON:  CT head 02/20/2023 FINDINGS: CT HEAD FINDINGS Brain: Patchy and confluent areas of decreased attenuation are noted throughout the deep and periventricular white matter of the cerebral hemispheres bilaterally, compatible with chronic microvascular ischemic disease. Left parietal central semiovale encephalomalacia. No evidence of large-territorial acute infarction. No parenchymal hemorrhage. No mass lesion. No extra-axial collection. No mass effect or midline shift. No hydrocephalus. Basilar cisterns are patent. Vascular: No hyperdense vessel. Atherosclerotic calcifications are present within the cavernous internal  carotid and vertebral arteries. Skull: No acute fracture or focal lesion. Sinuses/Orbits: Paranasal sinuses and mastoid air cells are clear. The orbits are unremarkable. Other: None. CT CERVICAL SPINE FINDINGS Alignment: Normal. Skull base and vertebrae: Multilevel mild-to-moderate degenerative changes of the spine with posterior disc osteophyte complex formation at the C3-C4 and C5-C6 levels. No associated severe osseous neural foraminal or central canal stenosis. No acute fracture. No aggressive appearing focal osseous lesion or focal pathologic process. Soft tissues and spinal canal: No prevertebral fluid or swelling. No visible canal hematoma. Upper chest: Biapical ground-glass and consolidative peribronchovascular airspace opacities. No apical pneumothoraces. Other: Partially visualized right internal jugular central venous catheter. IMPRESSION: 1. No acute intracranial abnormality. 2. No acute displaced fracture or traumatic listhesis of the cervical spine. 3. Biapical ground-glass and consolidative peribronchovascular airspace opacities. Please see separately dictated CT chest 03/26/2023. Electronically Signed   By: Tish Frederickson M.D.   On: 03/26/2023 03:57   DG Shoulder Right  Result Date: 03/26/2023 CLINICAL DATA:  fall EXAM: RIGHT SHOULDER - 2+ VIEW COMPARISON:  CT chest 03/26/2023 FINDINGS: There is no evidence of fracture or dislocation. There is no evidence of arthropathy or other focal bone abnormality. Soft tissues are unremarkable. Right chest wall accessed dialysis catheter with tip overlying the right atrium. Diffuse peribronchovascular central airspace and interstitial opacities. IMPRESSION: 1. Diffuse peribronchovascular central airspace and interstitial opacities. Please see separately dictated CT chest 03/26/2023. 2.  No acute displaced fracture or dislocation. Electronically Signed   By: Tish Frederickson M.D.   On: 03/26/2023 03:53   CT Chest Wo Contrast  Result Date:  03/23/2023 CLINICAL DATA:  Shortness of breath, difficulty breathing, cough, fever. Status post fall, on Lovenox. Metastatic endometrial cancer. EXAM: CT CHEST, ABDOMEN AND PELVIS WITHOUT CONTRAST TECHNIQUE: Multidetector CT imaging of the chest, abdomen and pelvis was performed following the standard protocol without IV contrast. RADIATION DOSE REDUCTION: This exam was performed according to the departmental dose-optimization program which includes automated exposure control, adjustment of the mA and/or kV according to patient size and/or use of iterative reconstruction technique. COMPARISON:  CT chest abdomen pelvis dated 02/08/2023. PET-CT dated 02/07/2023. FINDINGS: CT CHEST FINDINGS Cardiovascular: Heart is normal in size. No pericardial effusion. Hypodense fluid pleural to myocardium, suggesting anemia. No evidence thoracic aortic aneurysm. Mild atherosclerotic calcifications of the aortic arch. Moderate three-vessel coronary atherosclerosis. Right chest port terminates in the upper right atrium. Mediastinum/Nodes: No suspicious mediastinal lymphadenopathy. Visualized thyroid is unremarkable. Lungs/Pleura: Multifocal pulmonary masses/metastases, including: --4.2 x 3.4 cm mass in the central right upper lobe (series 302/image 37), previously 4.0 x 2.9 cm --6.8 x 5.0 cm left perihilar mass (series 302/image  86), previously 6.2 x 4.7 cm --6.8 x 6.3 cm right lower lobe mass (series 302/image 86), previously 6.6 x 4.4 cm when remeasured Additional scattered ground-glass opacities/interlobular septal thickening, favored to be related to the patient's pulmonary metastases and/or post treatment changes. Cavitary lesion with adjacent 10 mm peripheral mural nodule in the medial right lower lobe (series 302/image 36), previously 9 mm, grossly unchanged. This appearance favors primary bronchogenic carcinoma. Musculoskeletal: Mild degenerative changes of the thoracic spine. No fracture is seen. Specifically, the bilateral  ribs are intact. Known lytic metastasis in the medial left scapula (series 301/image 7), poorly visualized on CT. Known osseous metastasis in the left lateral 10th rib is not well visualized on CT. Destructive lytic osseous metastasis in the left posterior elements at T12 (series 301/image 86), grossly unchanged. CT ABDOMEN PELVIS FINDINGS Hepatobiliary: Unenhanced liver is unremarkable. Layering gallstone (series 03/01/image 3), without associated inflammatory changes. No intrahepatic or extrahepatic dilatation. Pancreas: Within normal limits. Spleen: Within normal limits. Adrenals/Urinary Tract: Adrenal glands are within normal limits. Left renal cortical scarring/atrophy. Mild to moderate right hydronephrosis, mildly progressive. Indwelling double pigtail ureteral stents in satisfactory position. Bladder is within normal limits. Stomach/Bowel: Stomach is within normal limits. No evidence of bowel obstruction. Normal appendix (series 301/image 85). No colonic wall thickening or inflammatory changes. Vascular/Lymphatic: No evidence of abdominal aortic aneurysm. Atherosclerotic calcifications of the abdominal aorta and branch vessels. IVC filter. No suspicious abdominopelvic lymphadenopathy. Reproductive: Status post hysterectomy. No adnexal masses. Other: No abdominopelvic ascites. Prior ventral hernia mesh repair. Musculoskeletal: No evidence of retroperitoneal hematoma. No fracture is seen. Degenerative changes of the lower lumbar spine. IMPRESSION: No acute traumatic injury to the chest, abdomen, or pelvis. Progressive multifocal pulmonary metastases, as above. Additional cavitary lesion with mural nodule in the medial right lower lobe is minimally progressive, favoring primary bronchogenic carcinoma. Known thoracic osseous metastases, as above, better evaluated on prior PET. Mild to moderate right hydronephrosis, mildly progressive. Indwelling double pigtail ureteral stents in satisfactory position.  Electronically Signed   By: Charline Bills M.D.   On: 03/23/2023 09:04   CT ABDOMEN PELVIS WO CONTRAST  Result Date: 03/23/2023 CLINICAL DATA:  Shortness of breath, difficulty breathing, cough, fever. Status post fall, on Lovenox. Metastatic endometrial cancer. EXAM: CT CHEST, ABDOMEN AND PELVIS WITHOUT CONTRAST TECHNIQUE: Multidetector CT imaging of the chest, abdomen and pelvis was performed following the standard protocol without IV contrast. RADIATION DOSE REDUCTION: This exam was performed according to the departmental dose-optimization program which includes automated exposure control, adjustment of the mA and/or kV according to patient size and/or use of iterative reconstruction technique. COMPARISON:  CT chest abdomen pelvis dated 02/08/2023. PET-CT dated 02/07/2023. FINDINGS: CT CHEST FINDINGS Cardiovascular: Heart is normal in size. No pericardial effusion. Hypodense fluid pleural to myocardium, suggesting anemia. No evidence thoracic aortic aneurysm. Mild atherosclerotic calcifications of the aortic arch. Moderate three-vessel coronary atherosclerosis. Right chest port terminates in the upper right atrium. Mediastinum/Nodes: No suspicious mediastinal lymphadenopathy. Visualized thyroid is unremarkable. Lungs/Pleura: Multifocal pulmonary masses/metastases, including: --4.2 x 3.4 cm mass in the central right upper lobe (series 302/image 37), previously 4.0 x 2.9 cm --6.8 x 5.0 cm left perihilar mass (series 302/image 86), previously 6.2 x 4.7 cm --6.8 x 6.3 cm right lower lobe mass (series 302/image 86), previously 6.6 x 4.4 cm when remeasured Additional scattered ground-glass opacities/interlobular septal thickening, favored to be related to the patient's pulmonary metastases and/or post treatment changes. Cavitary lesion with adjacent 10 mm peripheral mural nodule in the medial right lower  lobe (series 302/image 36), previously 9 mm, grossly unchanged. This appearance favors primary bronchogenic  carcinoma. Musculoskeletal: Mild degenerative changes of the thoracic spine. No fracture is seen. Specifically, the bilateral ribs are intact. Known lytic metastasis in the medial left scapula (series 301/image 7), poorly visualized on CT. Known osseous metastasis in the left lateral 10th rib is not well visualized on CT. Destructive lytic osseous metastasis in the left posterior elements at T12 (series 301/image 86), grossly unchanged. CT ABDOMEN PELVIS FINDINGS Hepatobiliary: Unenhanced liver is unremarkable. Layering gallstone (series 03/01/image 3), without associated inflammatory changes. No intrahepatic or extrahepatic dilatation. Pancreas: Within normal limits. Spleen: Within normal limits. Adrenals/Urinary Tract: Adrenal glands are within normal limits. Left renal cortical scarring/atrophy. Mild to moderate right hydronephrosis, mildly progressive. Indwelling double pigtail ureteral stents in satisfactory position. Bladder is within normal limits. Stomach/Bowel: Stomach is within normal limits. No evidence of bowel obstruction. Normal appendix (series 301/image 85). No colonic wall thickening or inflammatory changes. Vascular/Lymphatic: No evidence of abdominal aortic aneurysm. Atherosclerotic calcifications of the abdominal aorta and branch vessels. IVC filter. No suspicious abdominopelvic lymphadenopathy. Reproductive: Status post hysterectomy. No adnexal masses. Other: No abdominopelvic ascites. Prior ventral hernia mesh repair. Musculoskeletal: No evidence of retroperitoneal hematoma. No fracture is seen. Degenerative changes of the lower lumbar spine. IMPRESSION: No acute traumatic injury to the chest, abdomen, or pelvis. Progressive multifocal pulmonary metastases, as above. Additional cavitary lesion with mural nodule in the medial right lower lobe is minimally progressive, favoring primary bronchogenic carcinoma. Known thoracic osseous metastases, as above, better evaluated on prior PET. Mild to  moderate right hydronephrosis, mildly progressive. Indwelling double pigtail ureteral stents in satisfactory position. Electronically Signed   By: Charline Bills M.D.   On: 03/23/2023 09:04      IMPRESSION: Metastatic endometrial cancer: initially diagnosed in 2012 with 2 synchronous primaries of stage IC adenocarcinoma of the ovary and stage IIIC2 moderately differentiated endometrioid adenocarcinoma of the uterus              - PET showed pulmonary metastases and a new tumor involving the lower sacrum              - PET performed in January 2024 showed progression of bilateral pulmonary metastases/masses in the RUL, RLL, and LUL             - Imaging in July 2024 with a new right tibial mass consistent with metastatic endometrial cancer primary   She would be a good candidate for a short course of palliative radiation therapy directed at the tumor in the right lower leg with associated bone destruction.   PLAN: She will proceed with CT simulation today.  Treatments to begin tomorrow.  Anticipate 3 large fractions directed at the area of concern.  Anticipate that she will remain in the hospital for these 3 treatments.   50 minutes of total time was spent for this patient encounter, including preparation, face-to-face counseling with the patient and coordination of care, physical exam, and documentation of the encounter.   ------------------------------------------------  Billie Lade, PhD, MD  This document serves as a record of services personally performed by Antony Blackbird, MD. It was created on his behalf by Neena Rhymes, a trained medical scribe. The creation of this record is based on the scribe's personal observations and the provider's statements to them. This document has been checked and approved by the attending provider.

## 2023-04-05 ENCOUNTER — Inpatient Hospital Stay: Payer: 59 | Admitting: Hematology & Oncology

## 2023-04-05 ENCOUNTER — Inpatient Hospital Stay: Payer: 59

## 2023-04-05 ENCOUNTER — Other Ambulatory Visit: Payer: Self-pay

## 2023-04-05 ENCOUNTER — Other Ambulatory Visit: Payer: 59

## 2023-04-05 ENCOUNTER — Ambulatory Visit
Admit: 2023-04-05 | Discharge: 2023-04-05 | Disposition: A | Discharge status: Home / Self Care | Payer: 59 | Attending: Radiation Oncology | Admitting: Radiation Oncology

## 2023-04-05 DIAGNOSIS — C541 Malignant neoplasm of endometrium: Secondary | ICD-10-CM

## 2023-04-05 DIAGNOSIS — N179 Acute kidney failure, unspecified: Secondary | ICD-10-CM | POA: Diagnosis not present

## 2023-04-05 LAB — CBC
HCT: 27.6 % — ABNORMAL LOW (ref 36.0–46.0)
Hemoglobin: 8.2 g/dL — ABNORMAL LOW (ref 12.0–15.0)
MCH: 27.9 pg (ref 26.0–34.0)
MCHC: 29.7 g/dL — ABNORMAL LOW (ref 30.0–36.0)
MCV: 93.9 fL (ref 80.0–100.0)
Platelets: 130 10*3/uL — ABNORMAL LOW (ref 150–400)
RBC: 2.94 MIL/uL — ABNORMAL LOW (ref 3.87–5.11)
RDW: 17.4 % — ABNORMAL HIGH (ref 11.5–15.5)
WBC: 6.3 10*3/uL (ref 4.0–10.5)
nRBC: 0 % (ref 0.0–0.2)

## 2023-04-05 LAB — PHOSPHORUS: Phosphorus: 4.4 mg/dL (ref 2.5–4.6)

## 2023-04-05 LAB — RAD ONC ARIA SESSION SUMMARY
Course Elapsed Days: 0
Plan Fractions Treated to Date: 1
Plan Prescribed Dose Per Fraction: 6 Gy
Plan Total Fractions Prescribed: 3
Plan Total Prescribed Dose: 18 Gy
Reference Point Dosage Given to Date: 6 Gy
Reference Point Session Dosage Given: 6 Gy
Session Number: 1

## 2023-04-05 LAB — COMPREHENSIVE METABOLIC PANEL
ALT: 12 U/L (ref 0–44)
AST: 10 U/L — ABNORMAL LOW (ref 15–41)
Albumin: 2.2 g/dL — ABNORMAL LOW (ref 3.5–5.0)
Alkaline Phosphatase: 79 U/L (ref 38–126)
Anion gap: 14 (ref 5–15)
BUN: 53 mg/dL — ABNORMAL HIGH (ref 6–20)
CO2: 21 mmol/L — ABNORMAL LOW (ref 22–32)
Calcium: 8.6 mg/dL — ABNORMAL LOW (ref 8.9–10.3)
Chloride: 102 mmol/L (ref 98–111)
Creatinine, Ser: 3.62 mg/dL — ABNORMAL HIGH (ref 0.44–1.00)
GFR, Estimated: 14 mL/min — ABNORMAL LOW (ref >60)
Glucose, Bld: 104 mg/dL — ABNORMAL HIGH (ref 70–99)
Potassium: 3.9 mmol/L (ref 3.5–5.1)
Sodium: 137 mmol/L (ref 135–145)
Total Bilirubin: 0.7 mg/dL (ref 0.3–1.2)
Total Protein: 5.8 g/dL — ABNORMAL LOW (ref 6.5–8.1)

## 2023-04-05 LAB — GLUCOSE, CAPILLARY
Glucose-Capillary: 93 mg/dL (ref 70–99)
Glucose-Capillary: 94 mg/dL (ref 70–99)
Glucose-Capillary: 141 mg/dL — ABNORMAL HIGH (ref 70–99)
Glucose-Capillary: 154 mg/dL — ABNORMAL HIGH (ref 70–99)

## 2023-04-05 LAB — MAGNESIUM: Magnesium: 1.7 mg/dL (ref 1.7–2.4)

## 2023-04-05 NOTE — TOC Progression Note (Signed)
Transition of Care [facility]) - Progression Note    Patient Details  Name: Jaime Gomez MRN: [id number] Date of Birth: Jun 08, 1966  Transition of Care [facility]) CM/SW Contact  Larrie Kass, LCSW Phone Number: 04/05/2023, 1:15 PM  Clinical Narrative:    Per chart review pt has agreed to hospice.Pt to d/c with [facility] after radiation treatment. TOC to follow for d/c needs.    Expected Discharge Plan:  (TBD) Barriers to Discharge: Continued Medical Work up  Expected Discharge Plan and Services In-house Referral: Clinical Social Work     Living arrangements for the past 2 months: Single Family Home                                       Social Determinants of Health (SDOH) Interventions SDOH Screenings   Food Insecurity: No Food Insecurity (03/27/2023)  Housing: Low Risk  (03/27/2023)  Transportation Needs: No Transportation Needs (03/27/2023)  Utilities: Not At Risk (03/27/2023)  Depression (PHQ2-9): Low Risk  (03/01/2023)  Recent Concern: Depression (PHQ2-9) - High Risk (12/26/2022)  Financial Resource Strain: Low Risk  (06/27/2022)  Social Connections: Unknown (12/06/2021)   Received from [facility]  Stress: No Stress Concern Present (06/27/2022)  Tobacco Use: Low Risk  (03/27/2023)    Readmission Risk Interventions    02/10/2023    9:27 AM 11/30/2022   10:29 AM  Readmission Risk Prevention Plan  Transportation Screening Complete Complete  PCP or Specialist Appt within 3-5 Days  Complete  HRI or Home Care Consult  Complete  Social Work Consult for Recovery Care Planning/Counseling  Complete  Palliative Care Screening  Not Applicable  Medication Review [employer]) Complete Complete  PCP or Specialist appointment within 3-5 days of discharge Complete   HRI or Home Care Consult Complete   SW Recovery Care/Counseling Consult Complete   Palliative Care Screening Not Applicable   Skilled Nursing Facility Not Applicable

## 2023-04-05 NOTE — Plan of Care (Signed)
  Problem: Clinical Measurements: Goal: Diagnostic test results will improve Outcome: Progressing Goal: Respiratory complications will improve Outcome: Progressing   Problem: Coping: Goal: Level of anxiety will decrease Outcome: Progressing   Problem: Safety: Goal: Ability to remain free from injury will improve Outcome: Progressing   Problem: Skin Integrity: Goal: Risk for impaired skin integrity will decrease Outcome: Progressing

## 2023-04-05 NOTE — Progress Notes (Signed)
Palliative Medicine Progress Note   Patient Name: Jaime Gomez       Date: 04/05/2023 DOB: 08/06/1965  Age: 57 y.o. MRN#: [id number] Attending Physician: Jaime Elk, MD Primary Care Physician: Jaime Georges, MD Admit Date: 03/27/2023    HPI/Patient Profile: 57 y.o. 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  Plans for chemotherapy have been on hold due to hospitalizations and to await surgery. Admitted for acute on chronic renal injury and worsening dypnea, and anemia. Palliative medicine consulted for GOC.    Subjective: Chart reviewed, update received from the RN, and patient assessed at bedside. She is OOB to the recliner, awake, and able to communicate. She appears comfortable and denies pain at this time.   Note patient received sorbitol yesterday x 1 dose, and nursing documentation shows she had a BM yesterday evening.   I also spoke with her sister Jaime Gomez by phone. She confirms that plan is for patient to return home with hospice, after she completes a short-course of palliative radiation. She is already connected with [facility].    Objective:  Physical Exam Vitals reviewed.  Constitutional:      General: She is not in acute distress.    Comments: Chronically ill-appearing  Pulmonary:     Effort: Pulmonary effort is normal.  Neurological:     Mental Status: She is alert.             Palliative Medicine Assessment & Plan   Assessment: Principal Problem:   AKI (acute kidney injury) (HCC) Active Problems:    Endometrial cancer, FIGO stage IVB (HCC)   Personal history of other venous thrombosis and embolism   Type 2 diabetes mellitus without complications (HCC)   Metabolic acidosis   Depression, major, single episode, moderate (HCC)   Chronic respiratory failure with hypoxia (HCC)   Rhabdomyolysis   Malignant neoplasm metastatic to both lungs [facility])    Recommendations/Plan: Home with hospice after radiation Continue bowel regimen: senokot-S 2 tablets daily PMT will continue to follow  Code Status: DNR   Prognosis:  < 6 months    Thank you for  allowing the Palliative Medicine Team to assist in the care of this patient.   Total time: 25 minutes   Merry Proud, NP   Please contact Palliative Medicine Team phone at [phone] for questions and concerns.  For individual providers, please see AMION.

## 2023-04-05 NOTE — Progress Notes (Signed)
[facility] PROGRESS NOTE    Progress Note  [name]  YQM:[id number] DOB: [birth year] DOA: 03/27/2023 PCP: Karie Georges, MD     Brief Narrative:   Batul Gomez is an 57 y.o. 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 Received several units of packed red blood cells, acute kidney injury treated with IV fluids.  Unfortunately her pain remains uncontrolled.  Oncology, radiation oncology and palliative care medicine following.  She was started on palliative radiation on 04/04/2023.  Final disposition is home with hospice.    Assessment/Plan:   Endometrial cancer with metastases to the lung right tibia/malignant pain: Started on radiation on 04/05/2023.  She will complete 3 rounds of radiation. Oncology, radiation oncology palliative care medicine on board. Continue pain medication per palliative care. Overall she has a poor prognosis. Relates her pain is controlled, having regular bowel movement. Oncology recommended for her to be in the hospital for these radiations.  Acute kidney injury on chronic kidney disease stage IV: Baseline 3.3 on admission 4.9 resolved with IV fluids. Has mild hydronephrosis on CT, now her creatinine is returned to baseline.  Anemia of chronic disease: Status post 2 units of packed red blood cells, hemoglobin has remained relatively stable.  Uncontrolled diabetes mellitus type 2 with hyperglycemia: With an A1c of 7.7. Continue current insulin regimen, blood glucose fairly controlled.  History of DVT/PE: Continue Lovenox.  Essential hypertension: On no antihypertensive medication continue to monitor.  Anxiety and depression: Continue Effexor.  Right toe ulcer and wound: MRI showed no osteomyelitis. Vascular surgery recommended conservative  measures.  Resolved problems: Traumatic mild rhabdomyolysis in the setting of falls Metabolic acidosis Hyperkalemia Recent UTI Rhabdomyolysis    DVT prophylaxis: lovenox Family Communication:none Status is: Inpatient Remains inpatient appropriate because: Home with hospice due to terminal malignancy.    Code Status:     Code Status Orders  (From admission, onward)           Start     Ordered   03/27/23 1818  Do not attempt resuscitation (DNR)- Limited -Do Not Intubate (DNI)  Continuous       Question Answer Comment  If pulseless and not breathing No CPR or chest compressions.   In Pre-Arrest Conditions (Patient Is Breathing and Has A Pulse) Do not intubate. Provide all appropriate non-invasive medical interventions. Avoid ICU transfer unless indicated or required.   Consent: Discussion documented in EHR or advanced directives reviewed      03/27/23 1819           Code Status History     Date Active Date Inactive Code Status Order ID Comments User Context   03/02/2023 1329 03/03/2023 0518 Full Code [id number]  Gilmer Mor, DO HOV   02/09/2023 1059 02/10/2023 2318 DNR [id number]  Alena Bills, DO Inpatient   02/08/2023 1646 02/09/2023 1059 Full Code [id number]  Nolberto Hanlon, MD Inpatient   01/09/2023 1136 01/10/2023 0516 Full Code [id number]  Bennie Dallas, MD HOV   11/28/2022 1547 12/12/2022 1638 Full Code [id number]  Swayze, Ava, DO Inpatient   09/01/2021 2140 09/05/2021 1404 Full Code [id number]  Synetta Fail, MD Inpatient         IV Access:   Peripheral IV   Procedures and diagnostic studies:   No results found.   Medical Consultants:   None.   Subjective:  Jennice Mulroney relates her pain is controlled had a bowel movement.  Objective:    Vitals:   04/05/23 0500 04/05/23 0502 04/05/23 0817 04/05/23 1149  BP:  (!) 155/94  133/89  Pulse:  93  89  Resp:  14  20  Temp:  98 F (36.7 C)  97.7 F (36.5 C)  TempSrc:  Oral  Oral  SpO2:  96% 97%  100%  Weight: 69.4 kg     Height:       SpO2: 100 % O2 Flow Rate (L/min): 3 L/min FiO2 (%): 32 %   Intake/Output Summary (Last 24 hours) at 04/05/2023 1154 Last data filed at 04/04/2023 1400 Gross per 24 hour  Intake 0 ml  Output --  Net 0 ml   Filed Weights   04/03/23 0558 04/04/23 0500 04/05/23 0500  Weight: 70.2 kg 69.1 kg 69.4 kg    Exam: General exam: In no acute distress. Respiratory system: Good air movement and clear to auscultation. Cardiovascular system: S1 & S2 heard, RRR. No JVD. Gastrointestinal system: Abdomen is nondistended, soft and nontender.  Extremities: No pedal edema. Skin: No rashes, lesions or ulcers Psychiatry: Judgement and insight appear normal. Mood & affect appropriate.    Data Reviewed:    Labs: Basic Metabolic Panel: Recent Labs  Lab 03/30/23 0353 03/31/23 1050 04/01/23 0459 04/02/23 0112 04/03/23 0440 04/04/23 0329 04/05/23 0141  NA 136 135 139 135 138 136 137  K 5.0 4.9 5.2* 4.5 4.3 4.9 3.9  CL 100 105 103 101 104 104 102  CO2 22 22 22 23 23  20* 21*  GLUCOSE 64* 165* 126* 136* 102* 185* 104*  BUN 77* 73* 74* 74* 62* 57* 53*  CREATININE 4.23* 3.81* 3.98* 3.85* 3.75* 3.83* 3.62*  CALCIUM 7.9* 8.2* 8.8* 8.3* 8.8* 9.0 8.6*  MG 2.0 1.7 1.9  --   --   --  1.7  PHOS 5.0* 5.5* 5.7*  --   --   --  4.4   GFR Estimated Creatinine Clearance: 16.2 mL/min (A) (by C-G formula based on SCr of 3.62 mg/dL (H)). Liver Function Tests: Recent Labs  Lab 04/01/23 0459 04/02/23 0112 04/03/23 0440 04/04/23 0329 04/05/23 0141  AST 14* 15 11* 13* 10*  ALT 16 16 13 13 12   ALKPHOS 95 87 80 93 79  BILITOT 0.4 0.5 0.7 0.6 0.7  PROT 5.7* 5.8* 5.7* 6.2* 5.8*  ALBUMIN 2.1* 2.1* 2.1* 2.3* 2.2*   No results for input(s): "LIPASE", "AMYLASE" in the last 168 hours. No results for input(s): "AMMONIA" in the last 168 hours. Coagulation profile No results for input(s): "INR", "PROTIME" in the last 168 hours. COVID-19 Labs  No results for input(s):  "DDIMER", "FERRITIN", "LDH", "CRP" in the last 72 hours.  Lab Results  Component Value Date   SARSCOV2NAA NEGATIVE 03/23/2023   SARSCOV2NAA NEGATIVE 11/28/2022   SARSCOV2NAA NEGATIVE 09/01/2021    CBC: Recent Labs  Lab 03/30/23 0353 03/31/23 1050 04/01/23 0459 04/02/23 0112 04/03/23 0440 04/05/23 0141  WBC 6.1 6.1 6.5 5.6 5.9 6.3  NEUTROABS 4.9 5.0 5.1 4.3 4.5  --   HGB 9.7* 9.1* 8.7* 8.7* 8.1* 8.2*  HCT 31.3* 29.5* 28.9* 28.5* 27.0* 27.6*  MCV 89.9 92.5 92.3 92.2 93.1 93.9  PLT 195 181 155 150 143* 130*   Cardiac Enzymes: Recent Labs  Lab 03/30/23 0353 04/01/23 0459  CKTOTAL 416* 82   BNP (last 3 results) No results for input(s): "PROBNP" in the last 8760 hours. CBG: Recent Labs  Lab 04/04/23  1143 04/04/23 1641 04/04/23 2121 04/05/23 0754 04/05/23 1145  GLUCAP 133* 128* 135* 93 94   D-Dimer: No results for input(s): "DDIMER" in the last 72 hours. Hgb A1c: No results for input(s): "HGBA1C" in the last 72 hours. Lipid Profile: No results for input(s): "CHOL", "HDL", "LDLCALC", "TRIG", "CHOLHDL", "LDLDIRECT" in the last 72 hours. Thyroid function studies: No results for input(s): "TSH", "T4TOTAL", "T3FREE", "THYROIDAB" in the last 72 hours.  Invalid input(s): "FREET3" Anemia work up: No results for input(s): "VITAMINB12", "FOLATE", "FERRITIN", "TIBC", "IRON", "RETICCTPCT" in the last 72 hours. Sepsis Labs: Recent Labs  Lab 04/01/23 0459 04/02/23 0112 04/03/23 0440 04/05/23 0141  WBC 6.5 5.6 5.9 6.3   Microbiology Recent Results (from the past 240 hour(s))  Blood Culture (routine x 2)     Status: None   Collection Time: 03/27/23 12:44 PM   Specimen: BLOOD  Result Value Ref Range Status   Specimen Description   Final    BLOOD RIGHT ANTECUBITAL Performed at [facility], [address] [place], [place] [address]    Special Requests   Final    BOTTLES DRAWN AEROBIC AND ANAEROBIC Blood Culture adequate volume Performed at [facility], [address] [place], [place] [address]    Culture   Final    NO GROWTH 5 DAYS Performed at [facility], [address] [place], [place] [address]    Report Status 04/01/2023 FINAL  Final  Blood Culture (routine x 2)     Status: None   Collection Time: 03/27/23  1:17 PM   Specimen: BLOOD  Result Value Ref Range Status   Specimen Description   Final    BLOOD LEFT ANTECUBITAL Performed at [facility], [address] [place], [place] [address]    Special Requests   Final    BOTTLES DRAWN AEROBIC ONLY Blood Culture adequate volume Performed at [facility], [address] [place], [place] [address]    Culture   Final    NO GROWTH 5 DAYS Performed at [facility], [address], [place], [place] [address]    Report Status 04/01/2023 FINAL  Final  Urine Culture     Status: None   Collection Time: 03/27/23  6:18 PM   Specimen: Urine, Random  Result Value Ref Range Status   Specimen Description   Final    URINE, RANDOM Performed at [facility], [address] [place], [place] [address]    Special Requests   Final    NONE Reflexed from [id number] Performed at [facility], [address], [place], [place] [address]    Culture   Final    NO GROWTH Performed at [facility], [address], [place], [place] [address]    Report Status 03/28/2023 FINAL  Final     Medications:    Chlorhexidine Gluconate Cloth  6 each Topical Daily   enoxaparin (LOVENOX) injection  70 mg Subcutaneous Daily   fentaNYL  1 patch Transdermal Q72H   gabapentin  300 mg Oral QHS   insulin aspart  0-5 Units Subcutaneous QHS   insulin aspart  0-6 Units Subcutaneous TID WC   ipratropium  0.5 mg Nebulization TID   levalbuterol  0.63 mg Nebulization TID   lidocaine  2 patch Transdermal Q24H   LORazepam  0.5 mg Oral BID   oxybutynin  10 mg Oral Daily   senna-docusate  2  tablet Oral QHS   sodium chloride  flush  10-40 mL Intracatheter Q12H   sodium chloride flush  3 mL Intravenous Q12H   venlafaxine XR  75 mg Oral Q breakfast   Continuous Infusions:    LOS: 9 days   Marinda Elk  [facility]  04/05/2023, 11:54 AM

## 2023-04-05 NOTE — Progress Notes (Signed)
Physical Therapy Treatment Patient Details Name: Daiquiri Mechanic MRN: [id number] DOB: [birth year] Today's Date: 04/05/2023   History of Present Illness 57 year old Caucasian female with past medical history significant for but not limited to hypertension, insulin-dependent diabetes mellitus type 2, depression, malignant hydronephrosis with right ureteral stent, CKD stage IV, endometrial cancer with metastasis, history of PE and DVT on anticoagulants enoxaparin as well as other comorbidities who presented with progressive generalized weakness, fatigue and pain.  She is having progressive dyspnea and likely due to her pulmonary metastasis and was recently started on supplemental oxygen.  She was started on palliative radiation on 04/04/2023    PT Comments  Pt assisted to bathroom and then ambulated in hallway.  Pt reporting 7/10 ankle pain so RN notified.  Pt's caregiver during weekdays present and reports she has been assisting pt OOB and using bathroom in hospital.  Per reviewing chart, current plan is palliative radiation for right tibial lesion and then d/c home with hospice.    If plan is discharge home, recommend the following: A little help with walking and/or transfers;A little help with bathing/dressing/bathroom;Help with stairs or ramp for entrance;Assistance with cooking/housework   Can travel by private vehicle        Equipment Recommendations  None recommended by PT    Recommendations for Other Services       Precautions / Restrictions Precautions Precautions: Fall;Other (comment) Precaution Comments: She has a right tibia mestastic lesion given her outpatient workup and she was to have surgical intervention for repair of this however current POC is now palliative radiation Restrictions Other Position/Activity Restrictions: right tibia lesion that patient reported is WBAT with RW.     Mobility  Bed Mobility Overal bed mobility: Modified Independent Bed Mobility: Supine to Sit      Supine to sit: Contact guard, HOB elevated     General bed mobility comments: provided a hand for pt to self assist trunk upright    Transfers Overall transfer level: Needs assistance Equipment used: Rolling walker (2 wheels) Transfers: Sit to/from Stand Sit to Stand: Contact guard assist           General transfer comment: cues for hand placement    Ambulation/Gait Ambulation/Gait assistance: Contact guard assist Gait Distance (Feet): 80 Feet Assistive device: Rolling walker (2 wheels) Gait Pattern/deviations: Step-through pattern, Decreased stride length, Decreased stance time - right, Antalgic       General Gait Details: reports right ankle pain, cues for weight bearing through UEs using RW, ambulated on 3L O2   Stairs             Wheelchair Mobility     Tilt Bed    Modified Rankin (Stroke Patients Only)       Balance Overall balance assessment: History of Falls                                          Cognition Arousal: Alert Behavior During Therapy: Flat affect Overall Cognitive Status: Within Functional Limits for tasks assessed                                 General Comments: pt's cognition appears improved from previous PT sessions with this therapist; pt motivated to ambulate to tolerance; able to identify her caregiver in room        Exercises  General Comments        Pertinent Vitals/Pain Pain Assessment Pain Assessment: 0-10 Pain Score: 7  Pain Location: right ankle Pain Descriptors / Indicators: Sore, Grimacing, Discomfort Pain Intervention(s): Monitored during session, Repositioned, Patient requesting pain meds-RN notified    Home Living                          Prior Function            PT Goals (current goals can now be found in the care plan section) Progress towards PT goals: Progressing toward goals    Frequency    Min 1X/week      PT Plan       Co-evaluation              AM-PAC PT "6 Clicks" Mobility   Outcome Measure  Help needed turning from your back to your side while in a flat bed without using bedrails?: A Little Help needed moving from lying on your back to sitting on the side of a flat bed without using bedrails?: A Little Help needed moving to and from a bed to a chair (including a wheelchair)?: A Little Help needed standing up from a chair using your arms (e.g., wheelchair or bedside chair)?: A Little Help needed to walk in hospital room?: A Little Help needed climbing 3-5 steps with a railing? : A Lot 6 Click Score: 17    End of Session Equipment Utilized During Treatment: Gait belt;Oxygen Activity Tolerance: Patient tolerated treatment well Patient left: in chair;with call bell/phone within reach;with chair alarm set;with family/visitor present Nurse Communication: Mobility status PT Visit Diagnosis: Difficulty in walking, not elsewhere classified (R26.2)     Time: 1610-9604 PT Time Calculation (min) (ACUTE ONLY): 22 min  Charges:    $Gait Training: 8-22 mins PT General Charges $$ ACUTE PT VISIT: 1 Visit                    Thomasene Mohair PT, DPT Physical Therapist Acute Rehabilitation Services Office: [phone]    Kati L Payson 04/05/2023, 2:25 PM

## 2023-04-05 NOTE — Progress Notes (Signed)
   This pt is still be followed by [facility] and the plan is still to return home at d/c with hospice care to start. Pt continues to not need any equipment for d/c at this time. Norm Parcel RN [phone]

## 2023-04-05 NOTE — Progress Notes (Signed)
Jaime Gomez is doing okay this morning.  There is still may be a little bit of confusion.  She did not think that she was seen by radiation oncology yesterday.  She was seen by radiation oncology yesterday.  She is going to start radiation today.  She will get 3 fractions to the right lower leg.  I do know if the pain medicines might be making her little bit cognitively challenged.  The only change really has been the Duragesic patch.  This does seem to be helping her pain.  I would hate to have to stop this right now.  I will provide that she will be able to get radiation.  Hopefully this will help with some of the pain associated with the mass in the right lower leg.  Hopefully will decrease the risk of fracturing of the right tibia.  Her labs show sodium 137.  Potassium 3.9.  BUN 53 creatinine 3.62.  Calcium 8.6 with an albumin of 2.2.  Her white cell count 6.3.  Hemoglobin 8.2.  Platelet count 130,000.  She says that her pain is mostly in the chest and abdomen.  She is going to the bathroom.  Her vital signs show temperature of 98.  Pulse 93.  Blood pressure 155/94.  Her lungs are clear bilaterally.  She has good air movement bilaterally.  Cardiac exam regular rate and rhythm.  Abdomen is soft.  Bowel sounds are present.  It is hard to say there is any tenderness to palpation.  There is no fluid wave.  Extremity shows no clubbing, cyanosis or edema.  Neurological exam shows no obvious neurological deficits.  Again, she may have a little bit of cognitive issues.  I am glad that radiation can be done in 3 fractions.  This will certainly help her.  Again, she will have to be in the hospital for this.  I would not transfuse her right now.  Hopefully, the cognitive issues will improve.  I appreciate the great care that she is getting from everybody down on [address].   [name], MD   1 Chronicles 16:11

## 2023-04-06 ENCOUNTER — Ambulatory Visit
Admit: 2023-04-06 | Discharge: 2023-04-06 | Disposition: A | Discharge status: Home / Self Care | Payer: 59 | Attending: Radiation Oncology | Admitting: Radiation Oncology

## 2023-04-06 ENCOUNTER — Other Ambulatory Visit: Payer: Self-pay

## 2023-04-06 DIAGNOSIS — N179 Acute kidney failure, unspecified: Secondary | ICD-10-CM | POA: Diagnosis not present

## 2023-04-06 DIAGNOSIS — Z515 Encounter for palliative care: Secondary | ICD-10-CM | POA: Diagnosis not present

## 2023-04-06 DIAGNOSIS — C7801 Secondary malignant neoplasm of right lung: Secondary | ICD-10-CM | POA: Diagnosis not present

## 2023-04-06 DIAGNOSIS — N171 Acute kidney failure with acute cortical necrosis: Secondary | ICD-10-CM | POA: Diagnosis not present

## 2023-04-06 LAB — TYPE AND SCREEN
ABO/RH(D): A POS
Antibody Screen: POSITIVE
DAT, IgG: NEGATIVE
Donor AG Type: NEGATIVE
Unit division: 0

## 2023-04-06 LAB — RAD ONC ARIA SESSION SUMMARY
Course Elapsed Days: 1
Plan Fractions Treated to Date: 2
Plan Prescribed Dose Per Fraction: 6 Gy
Plan Total Fractions Prescribed: 3
Plan Total Prescribed Dose: 18 Gy
Reference Point Dosage Given to Date: 12 Gy
Reference Point Session Dosage Given: 6 Gy
Session Number: 2

## 2023-04-06 LAB — GLUCOSE, CAPILLARY
Glucose-Capillary: 114 mg/dL — ABNORMAL HIGH (ref 70–99)
Glucose-Capillary: 197 mg/dL — ABNORMAL HIGH (ref 70–99)
Glucose-Capillary: 142 mg/dL — ABNORMAL HIGH (ref 70–99)
Glucose-Capillary: 135 mg/dL — ABNORMAL HIGH (ref 70–99)

## 2023-04-06 LAB — BPAM RBC
Blood Product Expiration Date: 202410112359
Unit Type and Rh: 5100

## 2023-04-06 NOTE — Progress Notes (Signed)
[facility] PROGRESS NOTE    Progress Note  Jaime Gomez  LKG:[id number] DOB: [birth year] DOA: 03/27/2023 PCP: Karie Georges, MD     Brief Narrative:   Jaime Gomez is an 57 y.o. 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 Received several units of packed red blood cells, acute kidney injury treated with IV fluids.  Unfortunately her pain remains uncontrolled.  Oncology, radiation oncology and palliative care medicine following.  She was started on palliative radiation on 04/04/2023.  Final disposition is home with hospice.    Assessment/Plan:   Endometrial cancer with metastases to the lung right tibia/malignant pain: She will complete 3 rounds of radiation.  I think she did not started her radiation treatment yesterday. Oncology, radiation oncology palliative care medicine on board. Continue pain medication per palliative care.  Duragesic patch as we think this is making her confused. Overall she has a poor prognosis. Relates her pain is controlled, having regular bowel movement. Oncology recommended for her to be in the hospital for these radiations.  Acute kidney injury on chronic kidney disease stage IV: Baseline 3.3 on admission 4.9 resolved with IV fluids. Has mild hydronephrosis on CT, now her creatinine is returned to baseline.  Acute metabolic encephalopathy: Question due to narcotics for Duragesic tach which was stopped. She is slow to respond.  Anemia of chronic disease: Status post 2 units of packed red blood cells, hemoglobin has remained relatively stable.  Uncontrolled diabetes mellitus type 2 with hyperglycemia: With an A1c of 7.7. Continue current insulin regimen, blood glucose fairly controlled.  History of DVT/PE: Continue Lovenox.  Essential  hypertension: On no antihypertensive medication continue to monitor.  Anxiety and depression: Continue Effexor.  Right toe ulcer and wound: MRI showed no osteomyelitis. Vascular surgery recommended conservative measures.   Resolved problems: Traumatic mild rhabdomyolysis in the setting of falls Metabolic acidosis Hyperkalemia Recent UTI Rhabdomyolysis    DVT prophylaxis: lovenox Family Communication:none Status is: Inpatient Remains inpatient appropriate because: Home with hospice due to terminal malignancy.    Code Status:     Code Status Orders  (From admission, onward)           Start     Ordered   03/27/23 1818  Do not attempt resuscitation (DNR)- Limited -Do Not Intubate (DNI)  Continuous       Question Answer Comment  If pulseless and not breathing No CPR or chest compressions.   In Pre-Arrest Conditions (Patient Is Breathing and Has A Pulse) Do not intubate. Provide all appropriate non-invasive medical interventions. Avoid ICU transfer unless indicated or required.   Consent: Discussion documented in EHR or advanced directives reviewed      03/27/23 1819           Code Status History     Date Active Date Inactive Code Status Order ID Comments User Context   03/02/2023 1329 03/03/2023 0518 Full Code [id number]  Gilmer Mor, DO HOV   02/09/2023 1059 02/10/2023 2318 DNR [id number]  Alena Bills, DO Inpatient   02/08/2023 1646 02/09/2023 1059 Full Code [id number]  Nolberto Hanlon, MD Inpatient   01/09/2023 1136 01/10/2023 0516 Full Code [id number]  Bennie Dallas, MD HOV   11/28/2022 1547 12/12/2022 1638 Full Code [id number]  Swayze, Ava, DO Inpatient   09/01/2021 2140 09/05/2021 1404 Full Code [id number]  Synetta Fail, MD  Inpatient         IV Access:   Peripheral IV   Procedures and diagnostic studies:   No results found.   Medical Consultants:   None.   Subjective:    Oletta Michelini relates her pain is controlled.  Objective:    Vitals:    04/05/23 2031 04/06/23 0440 04/06/23 0500 04/06/23 0610  BP: 108/76 117/81  113/83  Pulse: (!) 104 (!) 115  (!) 112  Resp: 18 16  16   Temp: 98.7 F (37.1 C) 99.3 F (37.4 C)  98.6 F (37 C)  TempSrc: Oral Oral  Oral  SpO2: 95% 94%  94%  Weight:   64.9 kg   Height:       SpO2: 94 % O2 Flow Rate (L/min): 3 L/min FiO2 (%): 32 %  No intake or output data in the 24 hours ending 04/06/23 0831  Filed Weights   04/04/23 0500 04/05/23 0500 04/06/23 0500  Weight: 69.1 kg 69.4 kg 64.9 kg    Exam: General exam: In no acute distress. Respiratory system: Good air movement and clear to auscultation. Cardiovascular system: S1 & S2 heard, RRR. No JVD. Gastrointestinal system: Abdomen is nondistended, soft and nontender.  Central nervous system: Nonfocal physical exam Extremities: No pedal edema. Skin: No rashes, lesions or ulcers Psychiatry: Slow to respond. Data Reviewed:    Labs: Basic Metabolic Panel: Recent Labs  Lab 03/31/23 1050 04/01/23 0459 04/02/23 0112 04/03/23 0440 04/04/23 0329 04/05/23 0141  NA 135 139 135 138 136 137  K 4.9 5.2* 4.5 4.3 4.9 3.9  CL 105 103 101 104 104 102  CO2 22 22 23 23  20* 21*  GLUCOSE 165* 126* 136* 102* 185* 104*  BUN 73* 74* 74* 62* 57* 53*  CREATININE 3.81* 3.98* 3.85* 3.75* 3.83* 3.62*  CALCIUM 8.2* 8.8* 8.3* 8.8* 9.0 8.6*  MG 1.7 1.9  --   --   --  1.7  PHOS 5.5* 5.7*  --   --   --  4.4   GFR Estimated Creatinine Clearance: 14.6 mL/min (A) (by C-G formula based on SCr of 3.62 mg/dL (H)). Liver Function Tests: Recent Labs  Lab 04/01/23 0459 04/02/23 0112 04/03/23 0440 04/04/23 0329 04/05/23 0141  AST 14* 15 11* 13* 10*  ALT 16 16 13 13 12   ALKPHOS 95 87 80 93 79  BILITOT 0.4 0.5 0.7 0.6 0.7  PROT 5.7* 5.8* 5.7* 6.2* 5.8*  ALBUMIN 2.1* 2.1* 2.1* 2.3* 2.2*   No results for input(s): "LIPASE", "AMYLASE" in the last 168 hours. No results for input(s): "AMMONIA" in the last 168 hours. Coagulation profile No results for  input(s): "INR", "PROTIME" in the last 168 hours. COVID-19 Labs  No results for input(s): "DDIMER", "FERRITIN", "LDH", "CRP" in the last 72 hours.  Lab Results  Component Value Date   SARSCOV2NAA NEGATIVE 03/23/2023   SARSCOV2NAA NEGATIVE 11/28/2022   SARSCOV2NAA NEGATIVE 09/01/2021    CBC: Recent Labs  Lab 03/31/23 1050 04/01/23 0459 04/02/23 0112 04/03/23 0440 04/05/23 0141  WBC 6.1 6.5 5.6 5.9 6.3  NEUTROABS 5.0 5.1 4.3 4.5  --   HGB 9.1* 8.7* 8.7* 8.1* 8.2*  HCT 29.5* 28.9* 28.5* 27.0* 27.6*  MCV 92.5 92.3 92.2 93.1 93.9  PLT 181 155 150 143* 130*   Cardiac Enzymes: Recent Labs  Lab 04/01/23 0459  CKTOTAL 82   BNP (last 3 results) No results for input(s): "PROBNP" in the last 8760 hours. CBG: Recent Labs  Lab 04/05/23 0754 04/05/23 1145 04/05/23 1641  04/05/23 2027 04/06/23 0732  GLUCAP 93 94 141* 154* 114*   D-Dimer: No results for input(s): "DDIMER" in the last 72 hours. Hgb A1c: No results for input(s): "HGBA1C" in the last 72 hours. Lipid Profile: No results for input(s): "CHOL", "HDL", "LDLCALC", "TRIG", "CHOLHDL", "LDLDIRECT" in the last 72 hours. Thyroid function studies: No results for input(s): "TSH", "T4TOTAL", "T3FREE", "THYROIDAB" in the last 72 hours.  Invalid input(s): "FREET3" Anemia work up: No results for input(s): "VITAMINB12", "FOLATE", "FERRITIN", "TIBC", "IRON", "RETICCTPCT" in the last 72 hours. Sepsis Labs: Recent Labs  Lab 04/01/23 0459 04/02/23 0112 04/03/23 0440 04/05/23 0141  WBC 6.5 5.6 5.9 6.3   Microbiology Recent Results (from the past 240 hour(s))  Blood Culture (routine x 2)     Status: None   Collection Time: 03/27/23 12:44 PM   Specimen: BLOOD  Result Value Ref Range Status   Specimen Description   Final    BLOOD RIGHT ANTECUBITAL Performed at [facility], [address] [place], [place] [address]    Special Requests   Final    BOTTLES DRAWN AEROBIC AND ANAEROBIC Blood Culture adequate  volume Performed at [facility], [address] [place], [place] [address]    Culture   Final    NO GROWTH 5 DAYS Performed at [facility], [address], [place], [place] [address]    Report Status 04/01/2023 FINAL  Final  Blood Culture (routine x 2)     Status: None   Collection Time: 03/27/23  1:17 PM   Specimen: BLOOD  Result Value Ref Range Status   Specimen Description   Final    BLOOD LEFT ANTECUBITAL Performed at [facility], [address] [place], [place] [address]    Special Requests   Final    BOTTLES DRAWN AEROBIC ONLY Blood Culture adequate volume Performed at [facility], [address] [place], [place] [address]    Culture   Final    NO GROWTH 5 DAYS Performed at [facility], [address] [place], [place] [address]    Report Status 04/01/2023 FINAL  Final  Urine Culture     Status: None   Collection Time: 03/27/23  6:18 PM   Specimen: Urine, Random  Result Value Ref Range Status   Specimen Description   Final    URINE, RANDOM Performed at [facility], [address], [place], [place] [address]    Special Requests   Final    NONE Reflexed from [id number] Performed at [facility], [address], [place], [place] [address]    Culture   Final    NO GROWTH Performed at [facility], [address], [place], [place] [address]    Report Status 03/28/2023 FINAL  Final     Medications:    Chlorhexidine Gluconate Cloth  6 each Topical Daily   enoxaparin (LOVENOX) injection  70 mg Subcutaneous Daily   gabapentin  300 mg Oral QHS   insulin aspart  0-5 Units Subcutaneous QHS   insulin aspart  0-6 Units Subcutaneous TID WC   ipratropium  0.5 mg Nebulization TID   levalbuterol  0.63 mg Nebulization TID   lidocaine  2 patch Transdermal Q24H   oxybutynin  10 mg Oral Daily   senna-docusate  2 tablet Oral QHS   sodium chloride flush  10-40 mL Intracatheter Q12H    sodium chloride flush  3  mL Intravenous Q12H   venlafaxine XR  75 mg Oral Q breakfast   Continuous Infusions:    LOS: 10 days   Marinda Elk  [facility]  04/06/2023, 8:31 AM

## 2023-04-06 NOTE — Progress Notes (Signed)
Ms. Kamei clearly is more confused.  I suspect this might be from the Duragesic patch.  I will stop the Duragesic patch.  She clearly does not have any obvious pain issues.  I will also stop the scheduled dose of Ativan.  I am unsure if she had any radiation yesterday.  I do not see anything from Radiation Oncology that she had a treatment.  They were so good out today.  Again is hard to say how much she is eating.  Also give samples that she had cereal.  There is no labs back yet today.  Hopefully, her mental status will improve with stopping of the Duragesic patch.  She has no obvious shortness of breath.  There is no obvious nausea or vomiting.  She has had no fever.  She has had no bleeding.  Her vital signs are temperature of 98.6.  Pulse 112.  Blood pressure 113/83.  Her head and neck exam shows no ocular or oral lesions.  Oral mucosa is still slightly dry.  Lungs sound pretty clear bilaterally.  She has no wheezing.  Cardiac exam is tachycardic but regular.  Abdomen is soft.  Bowel sounds are present.  There is no fluid wave.  There is no palpable liver or spleen tip.  Extremities shows some slight tenderness in the right lower leg to palpation.  Neurological exam shows no focal neurological deficits.  Again, she has some cognitive changes.  The main problem right now is confusion.  She is nice.  She hopefully will improve with stopping of the Duragesic patch.  We will have to check some labs on her.  We are going get these tomorrow.  Again, I am not sure how much she is really eating.  We will see if she has any XRT today.  I do appreciate the great care she is getting from everybody up on [address].   [name], MD  Psalm 27:14

## 2023-04-06 NOTE — Plan of Care (Signed)
Problem: Health Behavior/Discharge Planning: Goal: Ability to manage health-related needs will improve Outcome: Adequate for Discharge

## 2023-04-06 NOTE — Plan of Care (Signed)
  Problem: Activity: Goal: Risk for activity intolerance will decrease Outcome: Progressing   Problem: Nutrition: Goal: Adequate nutrition will be maintained Outcome: Progressing   Problem: Coping: Goal: Level of anxiety will decrease Outcome: Progressing   Problem: Pain Managment: Goal: General experience of comfort will improve Outcome: Progressing   Problem: Safety: Goal: Ability to remain free from injury will improve Outcome: Progressing   Problem: Clinical Measurements: Goal: Respiratory complications will improve Outcome: Progressing

## 2023-04-06 NOTE — Plan of Care (Signed)
  Problem: Clinical Measurements: Goal: Ability to maintain clinical measurements within normal limits will improve Outcome: Progressing Goal: Will remain free from infection Outcome: Progressing Goal: Respiratory complications will improve Outcome: Progressing Goal: Cardiovascular complication will be avoided Outcome: Progressing   Problem: Coping: Goal: Level of anxiety will decrease Outcome: Progressing   Problem: Elimination: Goal: Will not experience complications related to bowel motility Outcome: Progressing Goal: Will not experience complications related to urinary retention Outcome: Progressing   Problem: Pain Managment: Goal: General experience of comfort will improve Outcome: Progressing   Problem: Safety: Goal: Ability to remain free from injury will improve Outcome: Progressing   Problem: Skin Integrity: Goal: Risk for impaired skin integrity will decrease Outcome: Progressing   Problem: Education: Goal: Knowledge of General Education information will improve Description: Including pain rating scale, medication(s)/side effects and non-pharmacologic comfort measures Outcome: Progressing   Problem: Clinical Measurements: Goal: Ability to maintain clinical measurements within normal limits will improve Outcome: Progressing Goal: Will remain free from infection Outcome: Progressing Goal: Diagnostic test results will improve Outcome: Progressing Goal: Respiratory complications will improve Outcome: Progressing Goal: Cardiovascular complication will be avoided Outcome: Progressing   Problem: Activity: Goal: Risk for activity intolerance will decrease Outcome: Progressing   Problem: Coping: Goal: Level of anxiety will decrease Outcome: Progressing   Problem: Elimination: Goal: Will not experience complications related to bowel motility Outcome: Progressing Goal: Will not experience complications related to urinary retention Outcome: Progressing    Problem: Pain Managment: Goal: General experience of comfort will improve Outcome: Progressing

## 2023-04-06 NOTE — Progress Notes (Signed)
   04/06/23 0440  Assess: MEWS Score  Temp 99.3 F (37.4 C)  BP 117/81  MAP (mmHg) 93  Pulse Rate (!) 115  Resp 16  SpO2 94 %  O2 Device Nasal Cannula  Assess: MEWS Score  MEWS Temp 0  MEWS Systolic 0  MEWS Pulse 2  MEWS RR 0  MEWS LOC 0  MEWS Score 2  MEWS Score Color Yellow  Assess: if the MEWS score is Yellow or Red  Were vital signs accurate and taken at a resting state? Yes  Does the patient meet 2 or more of the SIRS criteria? No  Does the patient have a confirmed or suspected source of infection? Yes  MEWS guidelines implemented  Yes, yellow  Treat  MEWS Interventions Considered administering scheduled or prn medications/treatments as ordered  Take Vital Signs  Increase Vital Sign Frequency  Yellow: Q2hr x1, continue Q4hrs until patient remains green for 12hrs  Escalate  MEWS: Escalate Yellow: Discuss with charge nurse and consider notifying provider and/or RRT  Notify: Charge Nurse/RN  Name of Charge Nurse/RN Notified Tamika, RN  Assess: SIRS CRITERIA  SIRS Temperature  0  SIRS Pulse 1  SIRS Respirations  0  SIRS WBC 0  SIRS Score Sum  1

## 2023-04-07 DIAGNOSIS — N179 Acute kidney failure, unspecified: Secondary | ICD-10-CM | POA: Diagnosis not present

## 2023-04-07 LAB — CBC WITH DIFFERENTIAL/PLATELET
Abs Immature Granulocytes: 0.11 10*3/uL — ABNORMAL HIGH (ref 0.00–0.07)
Basophils Absolute: 0 10*3/uL (ref 0.0–0.1)
Basophils Relative: 1 %
Eosinophils Absolute: 0.1 10*3/uL (ref 0.0–0.5)
Eosinophils Relative: 2 %
HCT: 28.2 % — ABNORMAL LOW (ref 36.0–46.0)
Hemoglobin: 8.3 g/dL — ABNORMAL LOW (ref 12.0–15.0)
Immature Granulocytes: 2 %
Lymphocytes Relative: 8 %
Lymphs Abs: 0.5 10*3/uL — ABNORMAL LOW (ref 0.7–4.0)
MCH: 28.1 pg (ref 26.0–34.0)
MCHC: 29.4 g/dL — ABNORMAL LOW (ref 30.0–36.0)
MCV: 95.6 fL (ref 80.0–100.0)
Monocytes Absolute: 0.5 10*3/uL (ref 0.1–1.0)
Monocytes Relative: 8 %
Neutro Abs: 4.9 10*3/uL (ref 1.7–7.7)
Neutrophils Relative %: 79 %
Platelets: 137 10*3/uL — ABNORMAL LOW (ref 150–400)
RBC: 2.95 MIL/uL — ABNORMAL LOW (ref 3.87–5.11)
RDW: 17.3 % — ABNORMAL HIGH (ref 11.5–15.5)
WBC: 6.1 10*3/uL (ref 4.0–10.5)
nRBC: 0 % (ref 0.0–0.2)

## 2023-04-07 LAB — COMPREHENSIVE METABOLIC PANEL
ALT: 10 U/L (ref 0–44)
AST: 8 U/L — ABNORMAL LOW (ref 15–41)
Albumin: 2.2 g/dL — ABNORMAL LOW (ref 3.5–5.0)
Alkaline Phosphatase: 83 U/L (ref 38–126)
Anion gap: 10 (ref 5–15)
BUN: 47 mg/dL — ABNORMAL HIGH (ref 6–20)
CO2: 23 mmol/L (ref 22–32)
Calcium: 8.2 mg/dL — ABNORMAL LOW (ref 8.9–10.3)
Chloride: 103 mmol/L (ref 98–111)
Creatinine, Ser: 3.45 mg/dL — ABNORMAL HIGH (ref 0.44–1.00)
GFR, Estimated: 15 mL/min — ABNORMAL LOW (ref >60)
Glucose, Bld: 115 mg/dL — ABNORMAL HIGH (ref 70–99)
Potassium: 4.1 mmol/L (ref 3.5–5.1)
Sodium: 136 mmol/L (ref 135–145)
Total Bilirubin: 0.6 mg/dL (ref 0.3–1.2)
Total Protein: 5.7 g/dL — ABNORMAL LOW (ref 6.5–8.1)

## 2023-04-07 LAB — GLUCOSE, CAPILLARY
Glucose-Capillary: 95 mg/dL (ref 70–99)
Glucose-Capillary: 174 mg/dL — ABNORMAL HIGH (ref 70–99)
Glucose-Capillary: 164 mg/dL — ABNORMAL HIGH (ref 70–99)
Glucose-Capillary: 143 mg/dL — ABNORMAL HIGH (ref 70–99)

## 2023-04-07 MED ORDER — IPRATROPIUM BROMIDE 0.02 % IN SOLN
0.5000 mg | Freq: Two times a day (BID) | RESPIRATORY_TRACT | Status: DC
  Administered 2023-04-07 – 2023-04-08 (×2): 0.5 mg via RESPIRATORY_TRACT
  Filled 2023-04-07 (×2): qty 2.5

## 2023-04-07 MED ORDER — OXYCODONE HCL ER 10 MG PO T12A: 10.0000 mg | EXTENDED_RELEASE_TABLET | Freq: Two times a day (BID) | ORAL | Status: DC

## 2023-04-07 MED ORDER — PHENOL 1.4 % MT LIQD
1.0000 | OROMUCOSAL | Status: DC | PRN
  Administered 2023-04-07: 1 via OROMUCOSAL
  Filled 2023-04-07: qty 177

## 2023-04-07 MED ORDER — LEVALBUTEROL HCL 0.63 MG/3ML IN NEBU
0.6300 mg | INHALATION_SOLUTION | Freq: Two times a day (BID) | RESPIRATORY_TRACT | Status: DC
  Administered 2023-04-07 – 2023-04-08 (×2): 0.63 mg via RESPIRATORY_TRACT
  Filled 2023-04-07 (×2): qty 3

## 2023-04-07 MED ORDER — OXYCODONE HCL ER 10 MG PO T12A
10.0000 mg | EXTENDED_RELEASE_TABLET | Freq: Two times a day (BID) | ORAL | Status: DC
  Administered 2023-04-07 – 2023-04-08 (×3): 10 mg via ORAL
  Filled 2023-04-07 (×3): qty 1

## 2023-04-07 NOTE — Progress Notes (Signed)
Jaime Gomez still has confusion today.  She is very pleasant.  She does not seem to be hurting.  Again, she really has some disorientation.  He says that she is at work.  She says that she lost a hubcap Yesterday.  She has no recollection of having radiation therapy yesterday.  I think she had radiation yesterday.  I am not sure what she is eating.  She says that she has a lot of Cheerios.  Her labs show sodium 136.  Potassium 4.1.  BUN 47 creatinine 3.45.  Calcium 8.2 with an albumin of 2.2.  White cell count 6.1.  Hemoglobin 8.3.  Platelet count 137,000.  Again there is no obvious shortness of breath.  There is no obvious bleeding.  She has had no nausea or vomiting.  On her physical exam, her vital signs are temperature 98.3.  Pulse 91.  Blood pressure 125/80.  Her head and exam shows no ocular or oral lesions.  There are no palpable cervical or supraclavicular lymph nodes.  Her lungs are clear bilaterally.  Cardiac exam regular rate and rhythm.  Abdomen is soft.  Bowel sounds are present.  There is no fluid wave.  There is no palpable liver or spleen tip.  Extremities shows no clubbing, cyanosis or edema.  She may have little bit of tenderness to palpation within the right lower leg.  Neurological exam does show the disorientation.   I hate that Jaime Gomez is so disoriented.  Again she is very pleasant.  This certainly could be reflective of her underlying malignancy.  I do not think she has any brain metastasis.  We stopped her Duragesic patch yesterday.  It may take a while for this to get out of her system secondary to her underlying renal disease.  I think she has 2 more radiation treatments left.  She definitely would benefit from Hospice when she goes home.  I know we we will get them involved.  This will make her life and her sister's life a lot easier.  I do appreciate the great care that she is getting from everybody up on [address].   [name], MD  Psalm 18:2

## 2023-04-07 NOTE — Progress Notes (Signed)
   04/07/23 1421  Assess: MEWS Score  Temp 99.5 F (37.5 C)  BP 127/84  MAP (mmHg) 99  Pulse Rate (!) 113  Resp 16  SpO2 95 %  O2 Device Nasal Cannula  O2 Flow Rate (L/min) 2 L/min  Assess: MEWS Score  MEWS Temp 0  MEWS Systolic 0  MEWS Pulse 2  MEWS RR 0  MEWS LOC 0  MEWS Score 2  MEWS Score Color Yellow  Assess: if the MEWS score is Yellow or Red  Were vital signs accurate and taken at a resting state? Yes  Does the patient meet 2 or more of the SIRS criteria? No  Does the patient have a confirmed or suspected source of infection? Yes  MEWS guidelines implemented  Yes, yellow  Treat  MEWS Interventions Considered administering scheduled or prn medications/treatments as ordered  Take Vital Signs  Increase Vital Sign Frequency  Yellow: Q2hr x1, continue Q4hrs until patient remains green for 12hrs  Escalate  MEWS: Escalate Yellow: Discuss with charge nurse and consider notifying provider and/or RRT  Assess: SIRS CRITERIA  SIRS Temperature  0  SIRS Pulse 1  SIRS Respirations  0  SIRS WBC 0  SIRS Score Sum  1

## 2023-04-07 NOTE — Progress Notes (Signed)
Report received form Paulino Rily. PT lying in bed awake at this time. Pt reported pain. Medication and other interventions provided at this time. Will continue to monitor pt. Call light within reach. Sister at bedside.

## 2023-04-07 NOTE — Progress Notes (Signed)
   This pt has been referred for hospice care at home. We are prepared to enroll pt under hospice care when she is d/c.   I spoke to the pt's sister [name] and she reports that the pt will likely d/c home today. She will go home by car.   Offer for nurse to visit this evening after they get home but she preferred for [place] to come in th morning so that they could get settled.   We plan to see pt in home tomorrow morning at 1000am unless the pt is not d/c home. The pt's sister [name] will update [place] if this happens so that we can reschedule.   Norm Parcel RN [phone]

## 2023-04-07 NOTE — Progress Notes (Signed)
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  She is having progressive dyspnea and likely due to her pulmonary metastasis and was recently started on supplemental oxygen.  She was started on palliative radiation on 04/04/2023   OT comments  Patients session was limited by pain with patient declining to progress past bed level. Patient repositioned in bed and educated on proper body mechanics to reduce risks of pressure and decrease pain. Patient plans to d/c home with caregiver and hospice support at time of d/c. Patient's discharge plan remains appropriate at this time. OT will continue to follow acutely.        If plan is discharge home, recommend the following:  A little help with walking and/or transfers;A little help with bathing/dressing/bathroom;Assist for transportation   Equipment Recommendations  None recommended by OT       Precautions / Restrictions Precautions Precautions: Fall;Other (comment) Precaution Comments: She has a right tibia mestastic lesion given her outpatient workup and she was to have surgical intervention for repair of this however current POC is now palliative radiation Restrictions Weight Bearing Restrictions: No Other Position/Activity Restrictions: right tibia lesion that patient reported is WBAT with RW.              ADL either performed or assessed with clinical judgement   ADL Overall ADL's : Needs assistance/impaired       General  ADL Comments: patient was lying crooked in bed with reports that her back hurt. patient was max A with reverse trendelenburg positioning utlized. patients sister was present with patient educated on proper body mechanics to reduce pain. patient decliend to participate in movement that she agreed to about 30 mins prior. patient expressed frustrations with still being in the hospital and wanting to go home to her dog. patients thoughts and feelings were heard and vaildated. patietn was educated on various ways to participate in stress relief strategies. patient declined at this time.      Cognition Arousal: Alert Behavior During Therapy: Flat affect Overall Cognitive Status: Within Functional Limits for tasks assessed       General Comments: patients sister was present in room.                   Pertinent Vitals/ Pain       Pain Assessment Pain Assessment: 0-10 Faces Pain Scale: Hurts whole lot Pain Location: back in bed Pain Descriptors / Indicators: Constant, Discomfort, Grimacing Pain Intervention(s): Limited activity within patient's tolerance, Monitored during session, Repositioned         Frequency  Min 1X/week        Progress Toward Goals  OT Goals(current goals can now be found in the care plan section)  Progress towards OT goals: Not progressing toward goals - comment (patients pain limiting participation today)     Plan         AM-PAC OT "6 Clicks" Daily Activity     Outcome Measure   Help from another person eating meals?: None Help from another person taking care of personal grooming?: A Little Help from another person toileting, which includes  using toliet, bedpan, or urinal?: A Little Help from another person bathing (including washing, rinsing, drying)?: A Little Help from another person to put on and taking off regular upper body clothing?: A Little Help from another person to put on and taking off regular lower body clothing?: A Little 6 Click  Score: 19    End of Session Equipment Utilized During Treatment: Oxygen  OT Visit Diagnosis: Unsteadiness on feet (R26.81);Muscle weakness (generalized) (M62.81)   Activity Tolerance Patient limited by pain   Patient Left in bed;with call bell/phone within reach;with family/visitor present   Nurse Communication Mobility status        Time: 6213-0865 OT Time Calculation (min): 16 min  Charges: OT General Charges $OT Visit: 1 Visit OT Treatments $Self Care/Home Management : 8-22 mins  Rosalio Loud, MS Acute Rehabilitation Department Office# [phone]   Selinda Flavin 04/07/2023, 3:57 PM

## 2023-04-07 NOTE — Progress Notes (Signed)
[facility] PROGRESS NOTE    Progress Note  Jaime Gomez  HYQ:[id number] DOB: Sep 09, 1965 DOA: 03/27/2023 PCP: Karie Georges, MD     Brief Narrative:   Jaime Gomez is an 57 y.o. 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 Received several units of packed red blood cells, acute kidney injury treated with IV fluids.  Unfortunately her pain remains uncontrolled.  Oncology, radiation oncology and palliative care medicine following.  She was started on palliative radiation on 04/04/2023.  Final disposition is home with hospice.    Assessment/Plan:   Endometrial cancer with metastases to the lung right tibia/malignant pain: Patient sister confirmed that she has had 2 radiation treatment she will continue her third radiation treatment on Monday as an outpatient. Oncology, radiation oncology palliative care medicine on board. Mentation is improved this morning. Having regular bowel movements and pain. Oncology recommended for her to be in the hospital for these radiations.  Acute kidney injury on chronic kidney disease stage IV: Baseline 3.3 on admission 4.9 resolved with IV fluids. Has mild hydronephrosis on CT, now her creatinine is returned to baseline.  Acute metabolic encephalopathy: Likely due to not CalmRx Duragesic patch, this was removed yesterday she is more awake today. Will start long-acting insulin plus short acting anything for breakthrough pain. Will monitor 24 hours and can probably be discharged in the morning if this regimen works for her.  Anemia of chronic disease: Status post 2 units of packed red blood cells, hemoglobin has remained relatively stable.  Uncontrolled diabetes mellitus type 2 with hyperglycemia: With an A1c of 7.7. Continue current insulin regimen,  blood glucose fairly controlled.  History of DVT/PE: Continue Lovenox.  Essential hypertension: On no antihypertensive medication continue to monitor.  Anxiety and depression: Continue Effexor.  Right toe ulcer and wound: MRI showed no osteomyelitis. Vascular surgery recommended conservative measures.   Resolved problems: Traumatic mild rhabdomyolysis in the setting of falls Metabolic acidosis Hyperkalemia Recent UTI Rhabdomyolysis    DVT prophylaxis: lovenox Family Communication:none Status is: Inpatient Remains inpatient appropriate because: Home with hospice due to terminal malignancy.    Code Status:     Code Status Orders  (From admission, onward)           Start     Ordered   03/27/23 1818  Do not attempt resuscitation (DNR)- Limited -Do Not Intubate (DNI)  Continuous       Question Answer Comment  If pulseless and not breathing No CPR or chest compressions.   In Pre-Arrest Conditions (Patient Is Breathing and Has A Pulse) Do not intubate. Provide all appropriate non-invasive medical interventions. Avoid ICU transfer unless indicated or required.   Consent: Discussion documented in EHR or advanced directives reviewed      03/27/23 1819           Code Status History     Date Active Date Inactive Code Status Order ID Comments User Context   03/02/2023 1329 03/03/2023 0518 Full Code [id number]  Gilmer Mor, DO HOV   02/09/2023 1059 02/10/2023 2318 DNR [id number]  Alena Bills, DO Inpatient   02/08/2023 1646 02/09/2023 1059 Full Code [id number]  Nolberto Hanlon, MD Inpatient   01/09/2023 1136 01/10/2023 0516 Full Code [id number]  Bennie Dallas, MD HOV   11/28/2022 1547 12/12/2022 1638 Full Code [id number]  Swayze, Ava, DO Inpatient  09/01/2021 2140 09/05/2021 1404 Full Code [id number]  Synetta Fail, MD Inpatient         IV Access:   Peripheral IV   Procedures and diagnostic studies:   No results found.   Medical Consultants:    None.   Subjective:    Jaime Gomez her pain is not controlled this morning.  Objective:    Vitals:   04/06/23 2047 04/07/23 0417 04/07/23 0417 04/07/23 0743  BP: (!) 137/90  125/80   Pulse: 90  91   Resp: 16  16   Temp: 98.1 F (36.7 C)  98.3 F (36.8 C)   TempSrc: Oral  Oral   SpO2: 95%  99% 98%  Weight:  66.1 kg    Height:       SpO2: 98 % O2 Flow Rate (L/min): 3 L/min FiO2 (%): 32 %   Intake/Output Summary (Last 24 hours) at 04/07/2023 1051 Last data filed at 04/07/2023 0832 Gross per 24 hour  Intake 483 ml  Output --  Net 483 ml    Filed Weights   04/05/23 0500 04/06/23 0500 04/07/23 0417  Weight: 69.4 kg 64.9 kg 66.1 kg    Exam: General exam: In no acute distress. Respiratory system: Good air movement and clear to auscultation. Cardiovascular system: S1 & S2 heard, RRR. No JVD. Gastrointestinal system: Abdomen is nondistended, soft and nontender.  Central nervous system: And oriented x 3, asking [place] appropriate questions Extremities: No pedal edema. Skin: No rashes, lesions or ulcers Psychiatry: Judgement and insight appear normal. Mood & affect appropriate. Data Reviewed:    Labs: Basic Metabolic Panel: Recent Labs  Lab 04/01/23 0459 04/02/23 0112 04/03/23 0440 04/04/23 0329 04/05/23 0141 04/07/23 0500  NA 139 135 138 136 137 136  K 5.2* 4.5 4.3 4.9 3.9 4.1  CL 103 101 104 104 102 103  CO2 22 23 23  20* 21* 23  GLUCOSE 126* 136* 102* 185* 104* 115*  BUN 74* 74* 62* 57* 53* 47*  CREATININE 3.98* 3.85* 3.75* 3.83* 3.62* 3.45*  CALCIUM 8.8* 8.3* 8.8* 9.0 8.6* 8.2*  MG 1.9  --   --   --  1.7  --   PHOS 5.7*  --   --   --  4.4  --    GFR Estimated Creatinine Clearance: 16.6 mL/min (A) (by C-G formula based on SCr of 3.45 mg/dL (H)). Liver Function Tests: Recent Labs  Lab 04/02/23 0112 04/03/23 0440 04/04/23 0329 04/05/23 0141 04/07/23 0500  AST 15 11* 13* 10* 8*  ALT 16 13 13 12 10   ALKPHOS 87 80 93 79 83  BILITOT 0.5 0.7 0.6  0.7 0.6  PROT 5.8* 5.7* 6.2* 5.8* 5.7*  ALBUMIN 2.1* 2.1* 2.3* 2.2* 2.2*   No results for input(s): "LIPASE", "AMYLASE" in the last 168 hours. No results for input(s): "AMMONIA" in the last 168 hours. Coagulation profile No results for input(s): "INR", "PROTIME" in the last 168 hours. COVID-19 Labs  No results for input(s): "DDIMER", "FERRITIN", "LDH", "CRP" in the last 72 hours.  Lab Results  Component Value Date   SARSCOV2NAA NEGATIVE 03/23/2023   SARSCOV2NAA NEGATIVE 11/28/2022   SARSCOV2NAA NEGATIVE 09/01/2021    CBC: Recent Labs  Lab 04/01/23 0459 04/02/23 0112 04/03/23 0440 04/05/23 0141 04/07/23 0500  WBC 6.5 5.6 5.9 6.3 6.1  NEUTROABS 5.1 4.3 4.5  --  4.9  HGB 8.7* 8.7* 8.1* 8.2* 8.3*  HCT 28.9* 28.5* 27.0* 27.6* 28.2*  MCV 92.3 92.2 93.1 93.9 95.6  PLT 155  150 143* 130* 137*   Cardiac Enzymes: Recent Labs  Lab 04/01/23 0459  CKTOTAL 82   BNP (last 3 results) No results for input(s): "PROBNP" in the last 8760 hours. CBG: Recent Labs  Lab 04/06/23 0732 04/06/23 1146 04/06/23 1642 04/06/23 2051 04/07/23 0718  GLUCAP 114* 197* 142* 135* 95   D-Dimer: No results for input(s): "DDIMER" in the last 72 hours. Hgb A1c: No results for input(s): "HGBA1C" in the last 72 hours. Lipid Profile: No results for input(s): "CHOL", "HDL", "LDLCALC", "TRIG", "CHOLHDL", "LDLDIRECT" in the last 72 hours. Thyroid function studies: No results for input(s): "TSH", "T4TOTAL", "T3FREE", "THYROIDAB" in the last 72 hours.  Invalid input(s): "FREET3" Anemia work up: No results for input(s): "VITAMINB12", "FOLATE", "FERRITIN", "TIBC", "IRON", "RETICCTPCT" in the last 72 hours. Sepsis Labs: Recent Labs  Lab 04/02/23 0112 04/03/23 0440 04/05/23 0141 04/07/23 0500  WBC 5.6 5.9 6.3 6.1   Microbiology No results found for this or any previous visit (from the past 240 hour(s)).    Medications:    Chlorhexidine Gluconate Cloth  6 each Topical Daily   enoxaparin  (LOVENOX) injection  70 mg Subcutaneous Daily   gabapentin  300 mg Oral QHS   insulin aspart  0-5 Units Subcutaneous QHS   insulin aspart  0-6 Units Subcutaneous TID WC   ipratropium  0.5 mg Nebulization BID   levalbuterol  0.63 mg Nebulization TID   levalbuterol  0.63 mg Nebulization BID   lidocaine  2 patch Transdermal Q24H   oxybutynin  10 mg Oral Daily   oxyCODONE  10 mg Oral Q12H   senna-docusate  2 tablet Oral QHS   sodium chloride flush  10-40 mL Intracatheter Q12H   sodium chloride flush  3 mL Intravenous Q12H   venlafaxine XR  75 mg Oral Q breakfast   Continuous Infusions:    LOS: 11 days   Marinda Elk  [facility]  04/07/2023, 10:51 AM

## 2023-04-08 DIAGNOSIS — C7951 Secondary malignant neoplasm of bone: Secondary | ICD-10-CM | POA: Diagnosis not present

## 2023-04-08 DIAGNOSIS — C7801 Secondary malignant neoplasm of right lung: Secondary | ICD-10-CM | POA: Diagnosis not present

## 2023-04-08 DIAGNOSIS — F32A Depression, unspecified: Secondary | ICD-10-CM | POA: Diagnosis not present

## 2023-04-08 DIAGNOSIS — Z86718 Personal history of other venous thrombosis and embolism: Secondary | ICD-10-CM | POA: Diagnosis not present

## 2023-04-08 DIAGNOSIS — E1122 Type 2 diabetes mellitus with diabetic chronic kidney disease: Secondary | ICD-10-CM | POA: Diagnosis not present

## 2023-04-08 DIAGNOSIS — N171 Acute kidney failure with acute cortical necrosis: Secondary | ICD-10-CM | POA: Diagnosis not present

## 2023-04-08 DIAGNOSIS — N184 Chronic kidney disease, stage 4 (severe): Secondary | ICD-10-CM | POA: Diagnosis not present

## 2023-04-08 DIAGNOSIS — N1339 Other hydronephrosis: Secondary | ICD-10-CM | POA: Diagnosis not present

## 2023-04-08 DIAGNOSIS — C7802 Secondary malignant neoplasm of left lung: Secondary | ICD-10-CM | POA: Diagnosis not present

## 2023-04-08 DIAGNOSIS — Z8542 Personal history of malignant neoplasm of other parts of uterus: Secondary | ICD-10-CM | POA: Diagnosis not present

## 2023-04-08 DIAGNOSIS — I129 Hypertensive chronic kidney disease with stage 1 through stage 4 chronic kidney disease, or unspecified chronic kidney disease: Secondary | ICD-10-CM | POA: Diagnosis not present

## 2023-04-08 DIAGNOSIS — Z7901 Long term (current) use of anticoagulants: Secondary | ICD-10-CM | POA: Diagnosis not present

## 2023-04-08 DIAGNOSIS — N170 Acute kidney failure with tubular necrosis: Secondary | ICD-10-CM

## 2023-04-08 DIAGNOSIS — Z86711 Personal history of pulmonary embolism: Secondary | ICD-10-CM | POA: Diagnosis not present

## 2023-04-08 DIAGNOSIS — Z794 Long term (current) use of insulin: Secondary | ICD-10-CM | POA: Diagnosis not present

## 2023-04-08 DIAGNOSIS — Z9689 Presence of other specified functional implants: Secondary | ICD-10-CM | POA: Diagnosis not present

## 2023-04-08 DIAGNOSIS — Z90710 Acquired absence of both cervix and uterus: Secondary | ICD-10-CM | POA: Diagnosis not present

## 2023-04-08 DIAGNOSIS — N1831 Chronic kidney disease, stage 3a: Secondary | ICD-10-CM

## 2023-04-08 DIAGNOSIS — N179 Acute kidney failure, unspecified: Secondary | ICD-10-CM | POA: Diagnosis not present

## 2023-04-08 LAB — CBC WITH DIFFERENTIAL/PLATELET
Abs Immature Granulocytes: 0.18 10*3/uL — ABNORMAL HIGH (ref 0.00–0.07)
Basophils Absolute: 0 10*3/uL (ref 0.0–0.1)
Basophils Relative: 1 %
Eosinophils Absolute: 0.2 10*3/uL (ref 0.0–0.5)
Eosinophils Relative: 3 %
HCT: 27.4 % — ABNORMAL LOW (ref 36.0–46.0)
Hemoglobin: 8.1 g/dL — ABNORMAL LOW (ref 12.0–15.0)
Immature Granulocytes: 3 %
Lymphocytes Relative: 8 %
Lymphs Abs: 0.6 10*3/uL — ABNORMAL LOW (ref 0.7–4.0)
MCH: 28 pg (ref 26.0–34.0)
MCHC: 29.6 g/dL — ABNORMAL LOW (ref 30.0–36.0)
MCV: 94.8 fL (ref 80.0–100.0)
Monocytes Absolute: 0.6 10*3/uL (ref 0.1–1.0)
Monocytes Relative: 8 %
Neutro Abs: 5.5 10*3/uL (ref 1.7–7.7)
Neutrophils Relative %: 77 %
Platelets: 144 10*3/uL — ABNORMAL LOW (ref 150–400)
RBC: 2.89 MIL/uL — ABNORMAL LOW (ref 3.87–5.11)
RDW: 17.5 % — ABNORMAL HIGH (ref 11.5–15.5)
WBC: 7.1 10*3/uL (ref 4.0–10.5)
nRBC: 0 % (ref 0.0–0.2)

## 2023-04-08 LAB — COMPREHENSIVE METABOLIC PANEL
ALT: 7 U/L (ref 0–44)
AST: 7 U/L — ABNORMAL LOW (ref 15–41)
Albumin: 2.1 g/dL — ABNORMAL LOW (ref 3.5–5.0)
Alkaline Phosphatase: 79 U/L (ref 38–126)
Anion gap: 7 (ref 5–15)
BUN: 42 mg/dL — ABNORMAL HIGH (ref 6–20)
CO2: 24 mmol/L (ref 22–32)
Calcium: 7.8 mg/dL — ABNORMAL LOW (ref 8.9–10.3)
Chloride: 106 mmol/L (ref 98–111)
Creatinine, Ser: 3.42 mg/dL — ABNORMAL HIGH (ref 0.44–1.00)
GFR, Estimated: 15 mL/min — ABNORMAL LOW (ref >60)
Glucose, Bld: 118 mg/dL — ABNORMAL HIGH (ref 70–99)
Potassium: 4 mmol/L (ref 3.5–5.1)
Sodium: 137 mmol/L (ref 135–145)
Total Bilirubin: 0.5 mg/dL (ref 0.3–1.2)
Total Protein: 5.9 g/dL — ABNORMAL LOW (ref 6.5–8.1)

## 2023-04-08 LAB — GLUCOSE, CAPILLARY: Glucose-Capillary: 98 mg/dL (ref 70–99)

## 2023-04-08 MED ORDER — OXYCODONE HCL ER 10 MG PO T12A: 10.0000 mg | EXTENDED_RELEASE_TABLET | Freq: Two times a day (BID) | ORAL | 0 refills | Status: DC

## 2023-04-08 MED ORDER — HEPARIN SOD (PORK) LOCK FLUSH 100 UNIT/ML IV SOLN
500.0000 [IU] | INTRAVENOUS | Status: AC | PRN
  Administered 2023-04-08: 500 [IU]

## 2023-04-08 MED ORDER — OXYCODONE HCL 10 MG PO TABS: 10.0000 mg | ORAL_TABLET | ORAL | 0 refills | Status: AC | PRN

## 2023-04-08 MED ORDER — POLYETHYLENE GLYCOL 3350 17 G PO PACK: 17.0000 g | PACK | Freq: Every day | ORAL | 0 refills | Status: DC

## 2023-04-08 NOTE — Plan of Care (Signed)
  Problem: Safety: Goal: Ability to remain free from injury will improve Outcome: Progressing   Problem: Skin Integrity: Goal: Risk for impaired skin integrity will decrease Outcome: Progressing   

## 2023-04-08 NOTE — Discharge Summary (Addendum)
Physician Discharge Summary  Jaime Gomez XLK:[id number] DOB: 12-29-1965 DOA: 03/27/2023  PCP: Karie Georges, MD  Admit date: 03/27/2023 Discharge date: 04/08/2023  Admitted From: Home Disposition:  Home  Recommendations for Outpatient Follow-up:  Follow up with Oncology in 1-2 weeks   Home Health:No Equipment/Devices:None  Discharge Condition:Hospice CODE STATUS:DNR Diet recommendation: Heart Healthy   Brief/Interim Summary: 57 y.o. 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 Received several units of packed red blood cells, acute kidney injury treated with IV fluids.  Unfortunately her pain remains uncontrolled.  Oncology, radiation oncology and palliative care medicine following.  She was started on palliative radiation on 04/04/2023.  Final disposition is home with hospice.  Discharge Diagnoses:  Principal Problem:   AKI (acute kidney injury) (HCC) Active Problems:   Endometrial cancer, FIGO stage IVB (HCC)   Personal history of other venous thrombosis and embolism   Type 2 diabetes mellitus without complications (HCC)   Metabolic acidosis   Depression, major, single episode, moderate (HCC)   Chronic respiratory failure with hypoxia (HCC)   Rhabdomyolysis   Malignant neoplasm metastatic to both lungs [facility])  Endometrial cancer with metastases to the lung right tibia/malignant pain: Oncology was consulted who recommended to start radiation for pain. Radiation oncology was consulted to started radiation and she received 2 treatments in house. Her narcotic regimen had to be changed to Oxy acting every 12+ oxycodone as needed for breakthrough pain which controlled her pain. Oncology met with patient and she low decided to move towards hospice with comfort measures at home.  Acute  kidney injury on chronic kidney see stage IV: With a baseline creatinine of 3.3 on admission 4.9 resolved with IV fluid resuscitation.  Acute metabolic encephalopathy: This was likely due to Duragesic patch which discontinue. She was placed on other narcotics.  Anemia of chronic disease: She is status post 2 units of packed red blood cells hemoglobin has remained stable.  Uncontrolled diabetes mellitus type 2 with hyperglycemia: With an A1c of 7.7 no change made to her medication. Type 2 diabetes with ketoacidosis without coma has been ruled out   History of DVT/PE: Continue Lovenox.  Essential hypertension: Her antihypertensive medication were discontinued.  Anxiety and depression: Continue Effexor.  Right toe ulcer: MRI showed no osteomyelitis.  Discharge Instructions  Discharge Instructions     Diet - low sodium heart healthy   Complete by: As directed    Increase activity slowly   Complete by: As directed    No wound care   Complete by: As directed       Allergies as of 04/08/2023       Reactions   Paclitaxel Anaphylaxis, Swelling, Other (See Comments)   Made everything swell   Taxotere [docetaxel] Anaphylaxis, Swelling, Other (See Comments)   Throat swelling        Medication List     STOP taking these medications    amLODipine 2.5 MG tablet Commonly known as: NORVASC   amoxicillin-clavulanate 500-125 MG tablet Commonly known as: Augmentin   carvedilol 12.5 MG tablet Commonly known as: COREG   dexamethasone 4 MG tablet Commonly known as: DECADRON       TAKE these medications    acetaminophen 500 MG tablet Commonly known as: TYLENOL Take 1,000 mg by mouth as needed for mild pain.   enoxaparin 60 MG/0.6ML injection Commonly  known as: LOVENOX Inject 0.6 mLs (60 mg total) into the skin daily.   famotidine 20 MG tablet Commonly known as: PEPCID Take 20 mg by mouth 2 (two) times daily as needed for heartburn or indigestion.   FreeStyle  Libre 3 Sensor Misc 1 Act by Does not apply route daily. Place 1 sensor on the skin every 14 days. Use to check glucose continuously   furosemide 20 MG tablet Commonly known as: LASIX TAKE 1 TABLET BY MOUTH EVERY DAY What changed:  when to take this reasons to take this   gabapentin 300 MG capsule Commonly known as: NEURONTIN Take 1 capsule (300 mg total) by mouth 3 (three) times daily.   insulin aspart 100 UNIT/ML injection Commonly known as: novoLOG Inject 0-10 Units into the skin 3 (three) times daily before meals.   Levemir FlexPen 100 UNIT/ML FlexPen Generic drug: insulin detemir INJECT 0.2MLS (20 UNITS) INTO THE SKIN TWICE A DAY What changed: See the new instructions.   lidocaine 5 % Commonly known as: LIDODERM Place 1 patch onto the skin daily. Remove & Discard patch within 12 hours or as directed by MD   oxybutynin 10 MG 24 hr tablet Commonly known as: DITROPAN-XL Take 1 tablet (10 mg total) by mouth daily.   Oxycodone HCl 10 MG Tabs Take 1 tablet (10 mg total) by mouth every 4 (four) hours as needed for up to 5 days for moderate pain.   oxyCODONE 10 mg 12 hr tablet Commonly known as: OXYCONTIN Take 1 tablet (10 mg total) by mouth every 12 (twelve) hours.   polyethylene glycol 17 g packet Commonly known as: MIRALAX / GLYCOLAX Take 17 g by mouth daily. What changed:  when to take this reasons to take this   sodium bicarbonate 650 MG tablet Take 1 tablet (650 mg total) by mouth 3 (three) times daily. What changed: when to take this   venlafaxine XR 37.5 MG 24 hr capsule Commonly known as: Effexor XR Take 1 capsule (37.5 mg total) by mouth daily with breakfast.        Allergies  Allergen Reactions   Paclitaxel Anaphylaxis, Swelling and Other (See Comments)    Made everything swell   Taxotere [Docetaxel] Anaphylaxis, Swelling and Other (See Comments)    Throat swelling    Consultations: Oncology Radiation oncology   Procedures/Studies: DG  CHEST PORT 1 VIEW  Result Date: 04/02/2023 CLINICAL DATA:  Shortness of breath EXAM: PORTABLE CHEST 1 VIEW COMPARISON:  03/29/2023 FINDINGS: Unchanged right more than left irregular pulmonary opacification with low lung volumes, reference recent chest CT. Normal heart size. No visible effusion or pneumothorax. Porta catheter on the right with tip at the SVC. IVC filter and bilateral renal stent. IMPRESSION: Bilateral pulmonary opacification primarily related to pulmonary nodules and masses. No new or progressive finding. Electronically Signed   By: Tiburcio Pea M.D.   On: 04/02/2023 22:01   VAS [place] ABI WITH/WO TBI  Result Date: 03/31/2023  LOWER EXTREMITY DOPPLER STUDY Patient Name:  Special Gartley  Date of Exam:   03/30/2023 Medical Rec #: [id number]     Accession #:    [id number] Date of Birth: Mar 05, 1966     Patient Gender: F Patient Age:   26 years Exam [facility]  [facility] Procedure:      VAS [place] ABI WITH/WO TBI Referring Phys: Marguerita Merles --------------------------------------------------------------------------------  Indications: Parasthesia High Risk Factors: Diabetes.  Limitations: Today's exam was limited due to an open wound and bandages. Comparison Study: No  prior studies. Performing Technologist: Olen Cordial RVT  Examination Guidelines: A complete evaluation includes at minimum, Doppler waveform signals and systolic blood pressure reading at the level of bilateral brachial, anterior tibial, and posterior tibial arteries, when vessel segments are accessible. Bilateral testing is considered an integral part of a complete examination. Photoelectric Plethysmograph (PPG) waveforms and toe systolic pressure readings are included as required and additional duplex testing as needed. Limited examinations for reoccurring indications may be performed as noted.  ABI Findings: +---------+------------------+-----+-----------+--------+ Right    Rt Pressure (mmHg)IndexWaveform   Comment   +---------+------------------+-----+-----------+--------+ Brachial 116                    triphasic           +---------+------------------+-----+-----------+--------+ PTA      252               2.15 multiphasic         +---------+------------------+-----+-----------+--------+ DP       254               2.17 multiphasic         +---------+------------------+-----+-----------+--------+ Great Toe                                  Wound    +---------+------------------+-----+-----------+--------+ +---------+------------------+-----+-----------+-------+ Left     Lt Pressure (mmHg)IndexWaveform   Comment +---------+------------------+-----+-----------+-------+ Brachial 117                    triphasic          +---------+------------------+-----+-----------+-------+ PTA      254               2.17 multiphasic        +---------+------------------+-----+-----------+-------+ DP       254               2.17 multiphasic        +---------+------------------+-----+-----------+-------+ Great Toe216               1.85                    +---------+------------------+-----+-----------+-------+ +-------+-----------+-----------+------------+------------+ ABI/TBIToday's ABIToday's TBIPrevious ABIPrevious TBI +-------+-----------+-----------+------------+------------+ Right  [place]                                             +-------+-----------+-----------+------------+------------+ Left   [place]         [place]                                  +-------+-----------+-----------+------------+------------+  Summary: Right: Resting right ankle-brachial index indicates noncompressible right lower extremity arteries. Unable to obtain TBI due to great toe open wound/bandages. Left: Resting left ankle-brachial index indicates noncompressible left lower extremity arteries. The left toe-brachial index is abnormal. *See table(s) above for measurements and observations.  Electronically  signed by Coral Else MD on 03/31/2023 at 7:30:55 PM.    Final    CT ABDOMEN PELVIS WO CONTRAST  Result Date: 03/30/2023 CLINICAL DATA:  Abdominal pain.  Metastatic endometrial cancer. EXAM: CT ABDOMEN AND PELVIS WITHOUT CONTRAST TECHNIQUE: Multidetector CT imaging of the abdomen and pelvis was performed following the standard protocol without IV contrast. RADIATION DOSE REDUCTION: This exam was performed  according to the departmental dose-optimization program which includes automated exposure control, adjustment of the mA and/or kV according to patient size and/or use of iterative reconstruction technique. COMPARISON:  CT of the chest abdomen pelvis dated 03/26/2023. FINDINGS: Evaluation of this exam is limited in the absence of intravenous contrast. Lower chest: Partially visualized bilateral pulmonary masses and patchy hazy densities as seen on the prior CT. The tip of a central venous line noted at the cavoatrial junction. There is advanced 3 vessel coronary vascular calcification. No intra-abdominal free air or free fluid. Hepatobiliary: The liver is unremarkable. No biliary dilatation. Noncalcified gallstone. No pericholecystic fluid or evidence of acute cholecystitis by CT. Pancreas: Unremarkable. No pancreatic ductal dilatation or surrounding inflammatory changes. Spleen: No acute findings.  Upper pole scarring. Adrenals/Urinary Tract: The adrenal glands are unremarkable. Bilateral ureteral stents in similar position. Renal vascular calcifications noted bilaterally. Nonobstructing bilateral renal calculi may be present. There is similar appearance of mild right hydronephrosis. The urinary bladder is grossly unremarkable. Stomach/Bowel: Mild sigmoid diverticulosis without active inflammatory changes. There is no bowel obstruction or active inflammation. The appendix is normal. Vascular/Lymphatic: Mild aortoiliac atherosclerotic disease. An infrarenal IVC filter is noted. No portal venous gas. There is no  adenopathy. Reproductive: Hysterectomy.  No adnexal masses. Other: Midline vertical anterior pelvic wall incisional scar. A ventral hernia repair mesh is noted. There is abutment of several loops of small bowel to the hernia repair mesh suggestive of adhesions. Musculoskeletal: Osteopenia with mild degenerative changes. No acute osseous pathology. IMPRESSION: 1. No acute intra-abdominal or pelvic pathology. 2. Bilateral ureteral stents in similar position as the prior CT. Similar appearance of mild right hydronephrosis. 3. Cholelithiasis. 4. Mild sigmoid diverticulosis. No bowel obstruction. Normal appendix. 5.  Aortic Atherosclerosis (ICD10-I70.0). Electronically Signed   By: Elgie Collard M.D.   On: 03/30/2023 15:12   MR FOOT RIGHT WO CONTRAST  Result Date: 03/30/2023 CLINICAL DATA:  Soft tissue infection suspected, foot. Tibial metastasis. Reported toe ulcer. EXAM: MRI OF THE RIGHT FOREFOOT WITHOUT CONTRAST TECHNIQUE: Multiplanar, multisequence MR imaging of the right forefoot was performed. No intravenous contrast was administered. COMPARISON:  Limited correlation made with previous MRI of the right lower leg 02/09/2023 which does not include the forefoot. No comparison radiographs. FINDINGS: Bones/Joint/Cartilage The site of the reported toe ulcer is not indicated and is not obvious to me. There is heterogeneous fat saturation on the T2 weighted images through the toes. No suspicious osseous findings are identified within the toes on the T1 weighted or inversion recovery images. There appears to be a subacute fracture of the 5th metatarsal neck with associated marrow edema. No cortical destruction identified. No other fractures are seen. There are degenerative changes at the 1st metatarsophalangeal joint. Mild midfoot degenerative changes are noted. No significant joint effusions. Ligaments Intact Lisfranc ligament. The collateral ligaments of the metatarsophalangeal joints appear intact. Muscles and  Tendons Generalized muscular T2 hyperintensity, likely related to diabetes. No evidence of forefoot tendon tear or significant tenosynovitis. Soft tissues No obvious skin ulcer identified in the toes. There is no evidence of soft tissue abscess or metastatic disease. Mild nonspecific dorsal subcutaneous edema without focal fluid collection. IMPRESSION: 1. No evidence of soft tissue abscess or osteomyelitis in the right forefoot. 2. Suspected subacute fracture of the 5th metatarsal neck. Recommend plain film and clinical correlation. 3. Mild degenerative changes as described. 4. No evidence of metastatic disease in the foot. Electronically Signed   By: Carey Bullocks M.D.   On: 03/30/2023 13:25  DG Abd 1 View  Result Date: 03/29/2023 CLINICAL DATA:  Abdominal pain. EXAM: ABDOMEN - 1 VIEW COMPARISON:  CT 03/26/2023 FINDINGS: Bilateral ureteral stents in place. IVC filter in place. Prior abdominal hernia repair with tacks. No bowel dilatation or obstruction. Small volume of stool in the colon. IMPRESSION: Normal bowel gas pattern. Bilateral ureteral stents in place. Electronically Signed   By: Narda Rutherford M.D.   On: 03/29/2023 21:54   DG CHEST PORT 1 VIEW  Result Date: 03/29/2023 CLINICAL DATA:  Shortness of breath EXAM: PORTABLE CHEST 1 VIEW COMPARISON:  CXR 03/27/23 FINDINGS: Right-sided needle access chest port in place. Pleural effusion. No pneumothorax. Unchanged cardiac and mediastinal contours. Unchanged airspace opacities in the right-greater-than-left perihilar region. No new focal airspace opacity. No radiographically apparent displaced rib fracture. Visualized upper abdomen is unremarkable. IMPRESSION: Unchanged airspace opacities in the right-greater-than-left perihilar region. No new focal airspace opacity. Electronically Signed   By: Lorenza Cambridge M.D.   On: 03/29/2023 12:33   DG Chest Port 1 View  Result Date: 03/27/2023 CLINICAL DATA:  Sepsis.  Fall yesterday. EXAM: PORTABLE CHEST 1 VIEW  COMPARISON:  X-ray 02/20/2023. CT scan chest abdomen pelvis 03/26/2023 FINDINGS: Right IJ chest port with tip at the central SVC. Underinflation. No pneumothorax, effusion. Overlapping cardiac leads. Stable cardiopericardial silhouette. Perihilar nodular contour opacities are once again identified, similar to the previous CT scan. Mild adjacent ground-glass interstitial type changes. IMPRESSION: Stable perihilar nodular opacities. Please correlate with the prior CT. No pneumothorax or effusion. Chest port Electronically Signed   By: Karen Kays M.D.   On: 03/27/2023 14:52   CT CHEST ABDOMEN PELVIS WO CONTRAST  Result Date: 03/26/2023 CLINICAL DATA:  Polytrauma, blunt. Fall. Metastatic endometrial cancer with bone metastasis. EXAM: CT CHEST, ABDOMEN AND PELVIS WITHOUT CONTRAST TECHNIQUE: Multidetector CT imaging of the chest, abdomen and pelvis was performed following the standard protocol without IV contrast. RADIATION DOSE REDUCTION: This exam was performed according to the departmental dose-optimization program which includes automated exposure control, adjustment of the mA and/or kV according to patient size and/or use of iterative reconstruction technique. COMPARISON:  03/23/2023. FINDINGS: CT CHEST FINDINGS Cardiovascular: Heart is normal in size and there is a trace pericardial effusion. Multi-vessel coronary artery calcifications are noted. A right chest port terminates at the cavoatrial junction. There is atherosclerotic calcification of the aorta without evidence of aneurysm. The pulmonary trunk is normal in caliber. Mediastinum/Nodes: No mediastinal or axillary lymphadenopathy. Evaluation of the hila is limited due to lack of IV contrast. The thyroid gland, trachea, and esophagus are within normal limits. Lungs/Pleura: Increased scattered ground-glass opacities are noted in the right lung. Stable ground-glass opacities are present in the left lung. There is redemonstration of masses in the right upper  lobe measuring 4.5 x 3.9 cm, not significantly changed when differences in technique are taken into consideration. There is a mass in the right lower lobe measuring 6.0 x 4.9 cm, axial image 90. There is a mass in the left upper lobe measuring 5.4 x 4.9 cm, axial image 85. A stable 1.2 cm nodule is present in the right lower lobe seen within a cavitary structure, axial image 111. Additional smaller nodular opacities are seen bilaterally and unchanged. No effusion or pneumothorax is seen. Musculoskeletal: Degenerative changes are present in the thoracic spine. No acute fracture is seen. A stable lytic region is noted in the medial border of the left scapula. There is a lytic lesion in the tenth rib on the left, unchanged. CT ABDOMEN  PELVIS FINDINGS Hepatobiliary: No focal liver abnormality is seen. A stone is present within the gallbladder. No biliary ductal dilatation. Pancreas: Unremarkable. No pancreatic ductal dilatation or surrounding inflammatory changes. Spleen: Normal in size without focal abnormality. Adrenals/Urinary Tract: No adrenal nodule or mass. Left renal atrophy is noted. Renal calculi are noted bilaterally. There is a left ureteral stent in place without evidence of obstructive uropathy. There is a right ureteral stent in place with mild hydroureteronephrosis. No obstructing stone is seen. The bladder is unremarkable. Stomach/Bowel: Stomach is within normal limits. Appendix appears normal. No evidence of bowel wall thickening, distention, or inflammatory changes. No free air or pneumatosis. Scattered diverticula are present along the colon without evidence of diverticulitis. Vascular/Lymphatic: An IVC filter is noted. Aortic atherosclerosis. No abdominal or pelvic lymphadenopathy. Reproductive: Status post hysterectomy. No adnexal masses. Other: No abdominopelvic ascites. Musculoskeletal: Degenerative changes are present in the lumbar spine. No acute osseous abnormality is seen. IMPRESSION: 1.  Stable lytic lesions in the bones without evidence of acute fracture. 2. Bilateral pulmonary masses and nodules, not significantly changed when differences in technique are taken into consideration. Increased ground-glass opacities are present in the right lung and stable ground-glass opacities are noted in the left lung, possibly representing metastatic disease. Clinical correlation is recommended to exclude superimposed infection. 3. Cavitary lesion with nodules in the right lower lobe, suspicious for bronchogenic carcinoma. 4. Bilateral nephrolithiasis with ureteral stents in place. There is mild-to-moderate hydroureteronephrosis on the right with no evidence of obstructing stone. Electronically Signed   By: Thornell Sartorius M.D.   On: 03/26/2023 04:19   CT L-SPINE NO CHARGE  Result Date: 03/26/2023 CLINICAL DATA:  fall. Pt. Is a cancer patient, with mets to the bone found recently. Pt. Did not lose consciousness or hit her head. She does take blood thinners. Pt. C/o L side pain. EXAM: CT THORACIC AND LUMBAR SPINE WITHOUT CONTRAST TECHNIQUE: Multidetector CT imaging of the thoracic and lumbar spine was performed without contrast. Multiplanar CT image reconstructions were also generated. RADIATION DOSE REDUCTION: This exam was performed according to the departmental dose-optimization program which includes automated exposure control, adjustment of the mA and/or kV according to patient size and/or use of iterative reconstruction technique. COMPARISON:  None Available. FINDINGS: CT THORACIC SPINE FINDINGS Alignment: Normal. Vertebrae: Multilevel mild-to-moderate degenerative changes of the spine. No associated severe osseous neural foraminal or central canal stenosis. Lytic lesion of the T12 left inferior articular facet and lamina with extension to the spinous process. Destruction of the cortex. Paraspinal and other soft tissues: Negative. Disc levels: Mild multilevel intervertebral disc space narrowing. CT LUMBAR  SPINE FINDINGS Segmentation: 5 lumbar type vertebrae. Alignment: Normal. Vertebrae: L5-S1 posterior disc osteophyte complex formation. No associated severe osseous neural foraminal or central canal stenosis. No acute fracture or focal pathologic process. Paraspinal and other soft tissues: Negative. Disc levels: Maintained. Other: Atherosclerotic plaque. Right chest wall Port-A-Cath. Inferior vena cava filter. Bilateral ureteral stents. Question lytic lesion of the S5 level. Please see separately dictated CT chest abdomen pelvis 03/26/2023. IMPRESSION: CT THORACIC SPINE IMPRESSION 1. Lytic lesion of the T12 left inferior articular facet and lamina with extension to the spinous process. Destruction of the cortex. Finding consistent with metastatic disease as noted in the history. 2. No acute displaced fracture or traumatic listhesis of the thoracic spine. CT LUMBAR SPINE IMPRESSION 1. No acute displaced fracture or traumatic listhesis of the lumbar spine. Other imaging findings of potential clinical significance: 1. Lung disease. 2. Question lytic lesion of the  S5 level. 3. Aortic Atherosclerosis (ICD10-I70.0). 4. Please see separately dictated CT chest abdomen pelvis 03/26/2023. Electronically Signed   By: Tish Frederickson M.D.   On: 03/26/2023 04:06   CT T-SPINE NO CHARGE  Result Date: 03/26/2023 CLINICAL DATA:  fall. Pt. Is a cancer patient, with mets to the bone found recently. Pt. Did not lose consciousness or hit her head. She does take blood thinners. Pt. C/o L side pain. EXAM: CT THORACIC AND LUMBAR SPINE WITHOUT CONTRAST TECHNIQUE: Multidetector CT imaging of the thoracic and lumbar spine was performed without contrast. Multiplanar CT image reconstructions were also generated. RADIATION DOSE REDUCTION: This exam was performed according to the departmental dose-optimization program which includes automated exposure control, adjustment of the mA and/or kV according to patient size and/or use of iterative  reconstruction technique. COMPARISON:  None Available. FINDINGS: CT THORACIC SPINE FINDINGS Alignment: Normal. Vertebrae: Multilevel mild-to-moderate degenerative changes of the spine. No associated severe osseous neural foraminal or central canal stenosis. Lytic lesion of the T12 left inferior articular facet and lamina with extension to the spinous process. Destruction of the cortex. Paraspinal and other soft tissues: Negative. Disc levels: Mild multilevel intervertebral disc space narrowing. CT LUMBAR SPINE FINDINGS Segmentation: 5 lumbar type vertebrae. Alignment: Normal. Vertebrae: L5-S1 posterior disc osteophyte complex formation. No associated severe osseous neural foraminal or central canal stenosis. No acute fracture or focal pathologic process. Paraspinal and other soft tissues: Negative. Disc levels: Maintained. Other: Atherosclerotic plaque. Right chest wall Port-A-Cath. Inferior vena cava filter. Bilateral ureteral stents. Question lytic lesion of the S5 level. Please see separately dictated CT chest abdomen pelvis 03/26/2023. IMPRESSION: CT THORACIC SPINE IMPRESSION 1. Lytic lesion of the T12 left inferior articular facet and lamina with extension to the spinous process. Destruction of the cortex. Finding consistent with metastatic disease as noted in the history. 2. No acute displaced fracture or traumatic listhesis of the thoracic spine. CT LUMBAR SPINE IMPRESSION 1. No acute displaced fracture or traumatic listhesis of the lumbar spine. Other imaging findings of potential clinical significance: 1. Lung disease. 2. Question lytic lesion of the S5 level. 3. Aortic Atherosclerosis (ICD10-I70.0). 4. Please see separately dictated CT chest abdomen pelvis 03/26/2023. Electronically Signed   By: Tish Frederickson M.D.   On: 03/26/2023 04:06   CT HEAD WO CONTRAST  Result Date: 03/26/2023 CLINICAL DATA:  Head trauma, moderate-severe; Polytrauma, blunt EXAM: CT HEAD WITHOUT CONTRAST CT CERVICAL SPINE WITHOUT  CONTRAST TECHNIQUE: Multidetector CT imaging of the head and cervical spine was performed following the standard protocol without intravenous contrast. Multiplanar CT image reconstructions of the cervical spine were also generated. RADIATION DOSE REDUCTION: This exam was performed according to the departmental dose-optimization program which includes automated exposure control, adjustment of the mA and/or kV according to patient size and/or use of iterative reconstruction technique. COMPARISON:  CT head 02/20/2023 FINDINGS: CT HEAD FINDINGS Brain: Patchy and confluent areas of decreased attenuation are noted throughout the deep and periventricular white matter of the cerebral hemispheres bilaterally, compatible with chronic microvascular ischemic disease. Left parietal central semiovale encephalomalacia. No evidence of large-territorial acute infarction. No parenchymal hemorrhage. No mass lesion. No extra-axial collection. No mass effect or midline shift. No hydrocephalus. Basilar cisterns are patent. Vascular: No hyperdense vessel. Atherosclerotic calcifications are present within the cavernous internal carotid and vertebral arteries. Skull: No acute fracture or focal lesion. Sinuses/Orbits: Paranasal sinuses and mastoid air cells are clear. The orbits are unremarkable. Other: None. CT CERVICAL SPINE FINDINGS Alignment: Normal. Skull base and vertebrae: Multilevel mild-to-moderate  degenerative changes of the spine with posterior disc osteophyte complex formation at the C3-C4 and C5-C6 levels. No associated severe osseous neural foraminal or central canal stenosis. No acute fracture. No aggressive appearing focal osseous lesion or focal pathologic process. Soft tissues and spinal canal: No prevertebral fluid or swelling. No visible canal hematoma. Upper chest: Biapical ground-glass and consolidative peribronchovascular airspace opacities. No apical pneumothoraces. Other: Partially visualized right internal jugular  central venous catheter. IMPRESSION: 1. No acute intracranial abnormality. 2. No acute displaced fracture or traumatic listhesis of the cervical spine. 3. Biapical ground-glass and consolidative peribronchovascular airspace opacities. Please see separately dictated CT chest 03/26/2023. Electronically Signed   By: Tish Frederickson M.D.   On: 03/26/2023 03:57   CT CERVICAL SPINE WO CONTRAST  Result Date: 03/26/2023 CLINICAL DATA:  Head trauma, moderate-severe; Polytrauma, blunt EXAM: CT HEAD WITHOUT CONTRAST CT CERVICAL SPINE WITHOUT CONTRAST TECHNIQUE: Multidetector CT imaging of the head and cervical spine was performed following the standard protocol without intravenous contrast. Multiplanar CT image reconstructions of the cervical spine were also generated. RADIATION DOSE REDUCTION: This exam was performed according to the departmental dose-optimization program which includes automated exposure control, adjustment of the mA and/or kV according to patient size and/or use of iterative reconstruction technique. COMPARISON:  CT head 02/20/2023 FINDINGS: CT HEAD FINDINGS Brain: Patchy and confluent areas of decreased attenuation are noted throughout the deep and periventricular white matter of the cerebral hemispheres bilaterally, compatible with chronic microvascular ischemic disease. Left parietal central semiovale encephalomalacia. No evidence of large-territorial acute infarction. No parenchymal hemorrhage. No mass lesion. No extra-axial collection. No mass effect or midline shift. No hydrocephalus. Basilar cisterns are patent. Vascular: No hyperdense vessel. Atherosclerotic calcifications are present within the cavernous internal carotid and vertebral arteries. Skull: No acute fracture or focal lesion. Sinuses/Orbits: Paranasal sinuses and mastoid air cells are clear. The orbits are unremarkable. Other: None. CT CERVICAL SPINE FINDINGS Alignment: Normal. Skull base and vertebrae: Multilevel mild-to-moderate  degenerative changes of the spine with posterior disc osteophyte complex formation at the C3-C4 and C5-C6 levels. No associated severe osseous neural foraminal or central canal stenosis. No acute fracture. No aggressive appearing focal osseous lesion or focal pathologic process. Soft tissues and spinal canal: No prevertebral fluid or swelling. No visible canal hematoma. Upper chest: Biapical ground-glass and consolidative peribronchovascular airspace opacities. No apical pneumothoraces. Other: Partially visualized right internal jugular central venous catheter. IMPRESSION: 1. No acute intracranial abnormality. 2. No acute displaced fracture or traumatic listhesis of the cervical spine. 3. Biapical ground-glass and consolidative peribronchovascular airspace opacities. Please see separately dictated CT chest 03/26/2023. Electronically Signed   By: Tish Frederickson M.D.   On: 03/26/2023 03:57   DG Shoulder Right  Result Date: 03/26/2023 CLINICAL DATA:  fall EXAM: RIGHT SHOULDER - 2+ VIEW COMPARISON:  CT chest 03/26/2023 FINDINGS: There is no evidence of fracture or dislocation. There is no evidence of arthropathy or other focal bone abnormality. Soft tissues are unremarkable. Right chest wall accessed dialysis catheter with tip overlying the right atrium. Diffuse peribronchovascular central airspace and interstitial opacities. IMPRESSION: 1. Diffuse peribronchovascular central airspace and interstitial opacities. Please see separately dictated CT chest 03/26/2023. 2.  No acute displaced fracture or dislocation. Electronically Signed   By: Tish Frederickson M.D.   On: 03/26/2023 03:53   CT Chest Wo Contrast  Result Date: 03/23/2023 CLINICAL DATA:  Shortness of breath, difficulty breathing, cough, fever. Status post fall, on Lovenox. Metastatic endometrial cancer. EXAM: CT CHEST, ABDOMEN AND PELVIS WITHOUT CONTRAST TECHNIQUE:  Multidetector CT imaging of the chest, abdomen and pelvis was performed following the  standard protocol without IV contrast. RADIATION DOSE REDUCTION: This exam was performed according to the departmental dose-optimization program which includes automated exposure control, adjustment of the mA and/or kV according to patient size and/or use of iterative reconstruction technique. COMPARISON:  CT chest abdomen pelvis dated 02/08/2023. PET-CT dated 02/07/2023. FINDINGS: CT CHEST FINDINGS Cardiovascular: Heart is normal in size. No pericardial effusion. Hypodense fluid pleural to myocardium, suggesting anemia. No evidence thoracic aortic aneurysm. Mild atherosclerotic calcifications of the aortic arch. Moderate three-vessel coronary atherosclerosis. Right chest port terminates in the upper right atrium. Mediastinum/Nodes: No suspicious mediastinal lymphadenopathy. Visualized thyroid is unremarkable. Lungs/Pleura: Multifocal pulmonary masses/metastases, including: --4.2 x 3.4 cm mass in the central right upper lobe (series 302/image 37), previously 4.0 x 2.9 cm --6.8 x 5.0 cm left perihilar mass (series 302/image 86), previously 6.2 x 4.7 cm --6.8 x 6.3 cm right lower lobe mass (series 302/image 86), previously 6.6 x 4.4 cm when remeasured Additional scattered ground-glass opacities/interlobular septal thickening, favored to be related to the patient's pulmonary metastases and/or post treatment changes. Cavitary lesion with adjacent 10 mm peripheral mural nodule in the medial right lower lobe (series 302/image 36), previously 9 mm, grossly unchanged. This appearance favors primary bronchogenic carcinoma. Musculoskeletal: Mild degenerative changes of the thoracic spine. No fracture is seen. Specifically, the bilateral ribs are intact. Known lytic metastasis in the medial left scapula (series 301/image 7), poorly visualized on CT. Known osseous metastasis in the left lateral 10th rib is not well visualized on CT. Destructive lytic osseous metastasis in the left posterior elements at T12 (series 301/image  86), grossly unchanged. CT ABDOMEN PELVIS FINDINGS Hepatobiliary: Unenhanced liver is unremarkable. Layering gallstone (series 03/01/image 3), without associated inflammatory changes. No intrahepatic or extrahepatic dilatation. Pancreas: Within normal limits. Spleen: Within normal limits. Adrenals/Urinary Tract: Adrenal glands are within normal limits. Left renal cortical scarring/atrophy. Mild to moderate right hydronephrosis, mildly progressive. Indwelling double pigtail ureteral stents in satisfactory position. Bladder is within normal limits. Stomach/Bowel: Stomach is within normal limits. No evidence of bowel obstruction. Normal appendix (series 301/image 85). No colonic wall thickening or inflammatory changes. Vascular/Lymphatic: No evidence of abdominal aortic aneurysm. Atherosclerotic calcifications of the abdominal aorta and branch vessels. IVC filter. No suspicious abdominopelvic lymphadenopathy. Reproductive: Status post hysterectomy. No adnexal masses. Other: No abdominopelvic ascites. Prior ventral hernia mesh repair. Musculoskeletal: No evidence of retroperitoneal hematoma. No fracture is seen. Degenerative changes of the lower lumbar spine. IMPRESSION: No acute traumatic injury to the chest, abdomen, or pelvis. Progressive multifocal pulmonary metastases, as above. Additional cavitary lesion with mural nodule in the medial right lower lobe is minimally progressive, favoring primary bronchogenic carcinoma. Known thoracic osseous metastases, as above, better evaluated on prior PET. Mild to moderate right hydronephrosis, mildly progressive. Indwelling double pigtail ureteral stents in satisfactory position. Electronically Signed   By: Charline Bills M.D.   On: 03/23/2023 09:04   CT ABDOMEN PELVIS WO CONTRAST  Result Date: 03/23/2023 CLINICAL DATA:  Shortness of breath, difficulty breathing, cough, fever. Status post fall, on Lovenox. Metastatic endometrial cancer. EXAM: CT CHEST, ABDOMEN AND  PELVIS WITHOUT CONTRAST TECHNIQUE: Multidetector CT imaging of the chest, abdomen and pelvis was performed following the standard protocol without IV contrast. RADIATION DOSE REDUCTION: This exam was performed according to the departmental dose-optimization program which includes automated exposure control, adjustment of the mA and/or kV according to patient size and/or use of iterative reconstruction technique. COMPARISON:  CT chest  abdomen pelvis dated 02/08/2023. PET-CT dated 02/07/2023. FINDINGS: CT CHEST FINDINGS Cardiovascular: Heart is normal in size. No pericardial effusion. Hypodense fluid pleural to myocardium, suggesting anemia. No evidence thoracic aortic aneurysm. Mild atherosclerotic calcifications of the aortic arch. Moderate three-vessel coronary atherosclerosis. Right chest port terminates in the upper right atrium. Mediastinum/Nodes: No suspicious mediastinal lymphadenopathy. Visualized thyroid is unremarkable. Lungs/Pleura: Multifocal pulmonary masses/metastases, including: --4.2 x 3.4 cm mass in the central right upper lobe (series 302/image 37), previously 4.0 x 2.9 cm --6.8 x 5.0 cm left perihilar mass (series 302/image 86), previously 6.2 x 4.7 cm --6.8 x 6.3 cm right lower lobe mass (series 302/image 86), previously 6.6 x 4.4 cm when remeasured Additional scattered ground-glass opacities/interlobular septal thickening, favored to be related to the patient's pulmonary metastases and/or post treatment changes. Cavitary lesion with adjacent 10 mm peripheral mural nodule in the medial right lower lobe (series 302/image 36), previously 9 mm, grossly unchanged. This appearance favors primary bronchogenic carcinoma. Musculoskeletal: Mild degenerative changes of the thoracic spine. No fracture is seen. Specifically, the bilateral ribs are intact. Known lytic metastasis in the medial left scapula (series 301/image 7), poorly visualized on CT. Known osseous metastasis in the left lateral 10th rib is  not well visualized on CT. Destructive lytic osseous metastasis in the left posterior elements at T12 (series 301/image 86), grossly unchanged. CT ABDOMEN PELVIS FINDINGS Hepatobiliary: Unenhanced liver is unremarkable. Layering gallstone (series 03/01/image 3), without associated inflammatory changes. No intrahepatic or extrahepatic dilatation. Pancreas: Within normal limits. Spleen: Within normal limits. Adrenals/Urinary Tract: Adrenal glands are within normal limits. Left renal cortical scarring/atrophy. Mild to moderate right hydronephrosis, mildly progressive. Indwelling double pigtail ureteral stents in satisfactory position. Bladder is within normal limits. Stomach/Bowel: Stomach is within normal limits. No evidence of bowel obstruction. Normal appendix (series 301/image 85). No colonic wall thickening or inflammatory changes. Vascular/Lymphatic: No evidence of abdominal aortic aneurysm. Atherosclerotic calcifications of the abdominal aorta and branch vessels. IVC filter. No suspicious abdominopelvic lymphadenopathy. Reproductive: Status post hysterectomy. No adnexal masses. Other: No abdominopelvic ascites. Prior ventral hernia mesh repair. Musculoskeletal: No evidence of retroperitoneal hematoma. No fracture is seen. Degenerative changes of the lower lumbar spine. IMPRESSION: No acute traumatic injury to the chest, abdomen, or pelvis. Progressive multifocal pulmonary metastases, as above. Additional cavitary lesion with mural nodule in the medial right lower lobe is minimally progressive, favoring primary bronchogenic carcinoma. Known thoracic osseous metastases, as above, better evaluated on prior PET. Mild to moderate right hydronephrosis, mildly progressive. Indwelling double pigtail ureteral stents in satisfactory position. Electronically Signed   By: Charline Bills M.D.   On: 03/23/2023 09:04   (Echo, Carotid, EGD, Colonoscopy, ERCP)    Subjective: No complaints  Discharge Exam: Vitals:    04/08/23 0815 04/08/23 0840  BP: 111/75   Pulse: 82   Resp: 18   Temp: (!) 97.5 F (36.4 C)   SpO2: 99% 98%   Vitals:   04/08/23 0011 04/08/23 0500 04/08/23 0815 04/08/23 0840  BP: 112/81 130/80 111/75   Pulse: 87 81 82   Resp: 18  18   Temp: 98.2 F (36.8 C) 98.1 F (36.7 C) (!) 97.5 F (36.4 C)   TempSrc: Oral Oral Oral   SpO2: 99% 99% 99% 98%  Weight:  66.2 kg    Height:        General: Pt is alert, awake, not in acute distress Cardiovascular: RRR, S1/S2 +, no rubs, no gallops Respiratory: CTA bilaterally, no wheezing, no rhonchi Abdominal: Soft, NT, ND, bowel  sounds + Extremities: no edema, no cyanosis    The results of significant diagnostics from this hospitalization (including imaging, microbiology, ancillary and laboratory) are listed below for reference.     Microbiology: No results found for this or any previous visit (from the past 240 hour(s)).   Labs: BNP (last 3 results) Recent Labs    12/03/22 0255 03/23/23 0725  BNP 2,665.1* 559.0*   Basic Metabolic Panel: Recent Labs  Lab 04/03/23 0440 04/04/23 0329 04/05/23 0141 04/07/23 0500 04/08/23 0227  NA 138 136 137 136 137  K 4.3 4.9 3.9 4.1 4.0  CL 104 104 102 103 106  CO2 23 20* 21* 23 24  GLUCOSE 102* 185* 104* 115* 118*  BUN 62* 57* 53* 47* 42*  CREATININE 3.75* 3.83* 3.62* 3.45* 3.42*  CALCIUM 8.8* 9.0 8.6* 8.2* 7.8*  MG  --   --  1.7  --   --   PHOS  --   --  4.4  --   --    Liver Function Tests: Recent Labs  Lab 04/03/23 0440 04/04/23 0329 04/05/23 0141 04/07/23 0500 04/08/23 0227  AST 11* 13* 10* 8* 7*  ALT 13 13 12 10 7   ALKPHOS 80 93 79 83 79  BILITOT 0.7 0.6 0.7 0.6 0.5  PROT 5.7* 6.2* 5.8* 5.7* 5.9*  ALBUMIN 2.1* 2.3* 2.2* 2.2* 2.1*   No results for input(s): "LIPASE", "AMYLASE" in the last 168 hours. No results for input(s): "AMMONIA" in the last 168 hours. CBC: Recent Labs  Lab 04/02/23 0112 04/03/23 0440 04/05/23 0141 04/07/23 0500 04/08/23 0227  WBC 5.6  5.9 6.3 6.1 7.1  NEUTROABS 4.3 4.5  --  4.9 5.5  HGB 8.7* 8.1* 8.2* 8.3* 8.1*  HCT 28.5* 27.0* 27.6* 28.2* 27.4*  MCV 92.2 93.1 93.9 95.6 94.8  PLT 150 143* 130* 137* 144*   Cardiac Enzymes: No results for input(s): "CKTOTAL", "CKMB", "CKMBINDEX", "TROPONINI" in the last 168 hours. BNP: Invalid input(s): "POCBNP" CBG: Recent Labs  Lab 04/07/23 0718 04/07/23 1132 04/07/23 1702 04/07/23 2132 04/08/23 0744  GLUCAP 95 174* 164* 143* 98   D-Dimer No results for input(s): "DDIMER" in the last 72 hours. Hgb A1c No results for input(s): "HGBA1C" in the last 72 hours. Lipid Profile No results for input(s): "CHOL", "HDL", "LDLCALC", "TRIG", "CHOLHDL", "LDLDIRECT" in the last 72 hours. Thyroid function studies No results for input(s): "TSH", "T4TOTAL", "T3FREE", "THYROIDAB" in the last 72 hours.  Invalid input(s): "FREET3" Anemia work up No results for input(s): "VITAMINB12", "FOLATE", "FERRITIN", "TIBC", "IRON", "RETICCTPCT" in the last 72 hours. Urinalysis    Component Value Date/Time   COLORURINE YELLOW 03/27/2023 1818   APPEARANCEUR TURBID (A) 03/27/2023 1818   LABSPEC 1.013 03/27/2023 1818   PHURINE 5.0 03/27/2023 1818   GLUCOSEU 50 (A) 03/27/2023 1818   HGBUR LARGE (A) 03/27/2023 1818   BILIRUBINUR NEGATIVE 03/27/2023 1818   KETONESUR NEGATIVE 03/27/2023 1818   PROTEINUR 100 (A) 03/27/2023 1818   NITRITE NEGATIVE 03/27/2023 1818   LEUKOCYTESUR LARGE (A) 03/27/2023 1818   Sepsis Labs Recent Labs  Lab 04/03/23 0440 04/05/23 0141 04/07/23 0500 04/08/23 0227  WBC 5.9 6.3 6.1 7.1   Microbiology No results found for this or any previous visit (from the past 240 hour(s)).   Time coordinating discharge: Over 40 minutes  SIGNED:   Marinda Elk, MD  [facility] 04/08/2023, 8:45 AM Pager   If 7PM-7AM, please contact night-coverage [email or web address] Password [email or web address]

## 2023-04-09 ENCOUNTER — Encounter: Payer: Self-pay | Admitting: Nurse Practitioner

## 2023-04-09 ENCOUNTER — Ambulatory Visit: Payer: 59

## 2023-04-09 ENCOUNTER — Ambulatory Visit
Admission: RE | Admit: 2023-04-09 | Discharge: 2023-04-09 | Disposition: A | Discharge status: Home / Self Care | Payer: 59 | Source: Ambulatory Visit | Attending: Radiation Oncology | Admitting: Radiation Oncology

## 2023-04-09 ENCOUNTER — Inpatient Hospital Stay: Payer: 59

## 2023-04-09 ENCOUNTER — Other Ambulatory Visit: Payer: Self-pay

## 2023-04-09 ENCOUNTER — Telehealth: Payer: Self-pay | Admitting: Nurse Practitioner

## 2023-04-09 DIAGNOSIS — N1339 Other hydronephrosis: Secondary | ICD-10-CM | POA: Diagnosis not present

## 2023-04-09 DIAGNOSIS — I129 Hypertensive chronic kidney disease with stage 1 through stage 4 chronic kidney disease, or unspecified chronic kidney disease: Secondary | ICD-10-CM | POA: Diagnosis not present

## 2023-04-09 DIAGNOSIS — N184 Chronic kidney disease, stage 4 (severe): Secondary | ICD-10-CM | POA: Diagnosis not present

## 2023-04-09 DIAGNOSIS — Z8542 Personal history of malignant neoplasm of other parts of uterus: Secondary | ICD-10-CM | POA: Diagnosis not present

## 2023-04-09 DIAGNOSIS — Z7901 Long term (current) use of anticoagulants: Secondary | ICD-10-CM | POA: Diagnosis not present

## 2023-04-09 DIAGNOSIS — E1122 Type 2 diabetes mellitus with diabetic chronic kidney disease: Secondary | ICD-10-CM | POA: Diagnosis not present

## 2023-04-09 DIAGNOSIS — Z794 Long term (current) use of insulin: Secondary | ICD-10-CM | POA: Diagnosis not present

## 2023-04-09 DIAGNOSIS — Z86718 Personal history of other venous thrombosis and embolism: Secondary | ICD-10-CM | POA: Diagnosis not present

## 2023-04-09 DIAGNOSIS — C7951 Secondary malignant neoplasm of bone: Secondary | ICD-10-CM | POA: Diagnosis not present

## 2023-04-09 DIAGNOSIS — Z510 Encounter for antineoplastic radiation therapy: Secondary | ICD-10-CM | POA: Diagnosis not present

## 2023-04-09 DIAGNOSIS — C541 Malignant neoplasm of endometrium: Secondary | ICD-10-CM | POA: Diagnosis not present

## 2023-04-09 DIAGNOSIS — Z90710 Acquired absence of both cervix and uterus: Secondary | ICD-10-CM | POA: Diagnosis not present

## 2023-04-09 DIAGNOSIS — Z86711 Personal history of pulmonary embolism: Secondary | ICD-10-CM | POA: Diagnosis not present

## 2023-04-09 DIAGNOSIS — C7801 Secondary malignant neoplasm of right lung: Secondary | ICD-10-CM | POA: Diagnosis not present

## 2023-04-09 DIAGNOSIS — Z9689 Presence of other specified functional implants: Secondary | ICD-10-CM | POA: Diagnosis not present

## 2023-04-09 DIAGNOSIS — F32A Depression, unspecified: Secondary | ICD-10-CM | POA: Diagnosis not present

## 2023-04-09 DIAGNOSIS — C7802 Secondary malignant neoplasm of left lung: Secondary | ICD-10-CM | POA: Diagnosis not present

## 2023-04-09 LAB — RAD ONC ARIA SESSION SUMMARY
Course Elapsed Days: 4
Plan Fractions Treated to Date: 3
Plan Prescribed Dose Per Fraction: 6 Gy
Plan Total Fractions Prescribed: 3
Plan Total Prescribed Dose: 18 Gy
Reference Point Dosage Given to Date: 18 Gy
Reference Point Session Dosage Given: 6 Gy
Session Number: 3

## 2023-04-09 NOTE — Telephone Encounter (Signed)
Ms. Celik presented to radiation today for her last treatment. She was initially scheduled to see palliative today however appointment was cancelled. Patient discharged home recently with home hospice via [facility]. [place], RN called and verified patient was enrolled. Patient officially enrolled on 04/08/2023.   Ms. Deeg at clinic today requesting to be seen. I saw patient and 2 family members in Rad Onc after her visit with Dr. Roselind Messier. Introduced myself and provided explanation of home hospice versus our services here at the cancer center. She and family verbalized understanding also confirming her enrollment with hospice. They are appreciative of update and understand no visit is necessary at this time given she is now under hospice care in the home.    Patient confirms symptoms are managed. Education provided that hospice will continue to manage and refill when needed.   All questions answered and support provided.   No Charge

## 2023-04-09 NOTE — Progress Notes (Deleted)
Palliative Medicine [facility]  Telephone:([phone]) [phone] [phone]) [phone]   Name: Jaime Gomez Date: 04/09/2023 MRN: [id number]  DOB: [birth year]  Patient Care Team: Karie Georges, MD as PCP - General (Family Medicine) Croitoru, Rachelle Hora, MD as PCP - Cardiology (Cardiology) Crist Fat, MD as Consulting Physician (Urology)    REASON FOR CONSULTATION: Jaime Gomez is a 57 y.o. 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  Palliative ask to see for symptom management and goals of care.    SOCIAL HISTORY:     reports that she has never smoked. She has never used smokeless tobacco. She reports that she does not drink alcohol and does not use drugs.  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/2021   Ovarian cancer (HCC) 2013   Pneumonia 12/03/2022   Stroke (HCC)    Wears glasses     PAST SURGICAL HISTORY:  Past Surgical History:  Procedure Laterality Date   ABDOMINAL HYSTERECTOMY     BOTOX INJECTION N/A 01/12/2023   Procedure: BOTOX INJECTION INTO BLADDER;  Surgeon: Jannifer Hick, MD;  Location: [facility];  Service: Urology;  Laterality: N/A;   CYSTOSCOPY W/ RETROGRADES Right 01/12/2023   Procedure: CYSTOSCOPY WITH RIGHT  RETROGRADE PYELOGRAM/ RIGHT URETERAL STENT CHANGE;   Surgeon: Jannifer Hick, MD;  Location: [facility];  Service: Urology;  Laterality: Right;   CYSTOSCOPY W/ URETERAL STENT PLACEMENT Bilateral 09/01/2021   Procedure: CYSTOSCOPY WITH RETROGRADE PYELOGRAM/URETERAL STENT PLACEMENT;  Surgeon: Jannifer Hick, MD;  Location: [facility];  Service: Urology;  Laterality: Bilateral;   CYSTOSCOPY WITH FULGERATION N/A 01/12/2023   Procedure: CYSTOSCOPY WITH FULGERATION;  Surgeon: Jannifer Hick, MD;  Location[facility];  Service: Urology;  Laterality: N/A;   CYSTOSCOPY WITH STENT PLACEMENT Right 05/19/2022   Procedure: CYSTOSCOPY WITH STENT CHANGE;  Surgeon: Jannifer Hick, MD;  Location: [facility];  Service: Urology;  Laterality: Right;  ONLY NEEDS 30 MIN   HERNIA REPAIR     IR CONVERT LEFT NEPHROSTOMY TO NEPHROURETERAL CATH  11/02/2021   IR CONVERT LEFT NEPHROSTOMY TO NEPHROURETERAL CATH  01/09/2023   IR CV LINE INJECTION  08/23/2021   IR DIL URETER LEFT  01/09/2023   IR IMAGING GUIDED PORT INSERTION  11/14/2021   IR IMAGING GUIDED PORT INSERTION  01/09/2023   IR NEPHROSTOMY EXCHANGE LEFT  11/02/2021   IR NEPHROSTOMY EXCHANGE LEFT  01/20/2022   IR NEPHROSTOMY EXCHANGE LEFT  11/29/2022   IR NEPHROSTOMY PLACEMENT LEFT  09/03/2021   IR REMOVAL TUN ACCESS W/ PORT W/O FL MOD SED  11/14/2021   IR REMOVAL TUN ACCESS W/ PORT W/O FL MOD SED  12/04/2022   IR URETERAL STENT PLACEMENT EXISTING ACCESS LEFT  02/28/2023    HEMATOLOGY/ONCOLOGY HISTORY:  Oncology History  Endometrial cancer, FIGO stage  IVB (HCC)  08/18/2021 Initial Diagnosis   Endometrial cancer, FIGO stage IVB (HCC)   08/18/2021 Cancer Staging   Staging form: Corpus Uteri - Carcinoma and Carcinosarcoma, AJCC 8th Edition - Clinical stage from 08/18/2021: FIGO Stage IVB (rcT3a, cNX, pM1) - Signed by [name], MD on 08/18/2021 Stage prefix: Recurrence Histologic grade (G): G2 Histologic grading system: 3 grade system     ALLERGIES:  is allergic to paclitaxel and taxotere  [docetaxel].  MEDICATIONS:  Current Outpatient Medications  Medication Sig Dispense Refill   acetaminophen (TYLENOL) 500 MG tablet Take 1,000 mg by mouth as needed for mild pain.     Continuous Glucose Sensor (FREESTYLE LIBRE 3 SENSOR) MISC 1 Act by Does not apply route daily. Place 1 sensor on the skin every 14 days. Use to check glucose continuously 2 each 11   enoxaparin (LOVENOX) 60 MG/0.6ML injection Inject 0.6 mLs (60 mg total) into the skin daily. 18 mL 2   famotidine (PEPCID) 20 MG tablet Take 20 mg by mouth 2 (two) times daily as needed for heartburn or indigestion.     furosemide (LASIX) 20 MG tablet TAKE 1 TABLET BY MOUTH EVERY DAY (Patient taking differently: Take 20 mg by mouth daily as needed for fluid or edema.) 30 tablet 3   gabapentin (NEURONTIN) 300 MG capsule Take 1 capsule (300 mg total) by mouth 3 (three) times daily. 90 capsule 3   insulin aspart (NOVOLOG) 100 UNIT/ML injection Inject 0-10 Units into the skin 3 (three) times daily before meals.     insulin detemir (LEVEMIR FLEXPEN) 100 UNIT/ML FlexPen INJECT 0.2MLS (20 UNITS) INTO THE SKIN TWICE A DAY (Patient taking differently: Inject 20 Units into the skin 2 (two) times daily.) 3 mL 4   lidocaine (LIDODERM) 5 % Place 1 patch onto the skin daily. Remove & Discard patch within 12 hours or as directed by MD 30 patch 0   oxybutynin (DITROPAN-XL) 10 MG 24 hr tablet Take 1 tablet (10 mg total) by mouth daily. 90 tablet 1   oxyCODONE (OXYCONTIN) 10 mg 12 hr tablet Take 1 tablet (10 mg total) by mouth every 12 (twelve) hours. 14 tablet 0   oxyCODONE 10 MG TABS Take 1 tablet (10 mg total) by mouth every 4 (four) hours as needed for up to 5 days for moderate pain. 30 tablet 0   polyethylene glycol (MIRALAX / GLYCOLAX) 17 g packet Take 17 g by mouth daily. 14 each 0   sodium bicarbonate 650 MG tablet Take 1 tablet (650 mg total) by mouth 3 (three) times daily. (Patient taking differently: Take 650 mg by mouth 2 (two) times daily.) 90  tablet 2   venlafaxine XR (EFFEXOR XR) 37.5 MG 24 hr capsule Take 1 capsule (37.5 mg total) by mouth daily with breakfast. 90 capsule 1   No current facility-administered medications for this visit.    VITAL SIGNS: There were no vitals taken for this visit. There were no vitals filed for this visit.  Estimated body mass index is 25.05 kg/m as calculated from the following:   Height as of 03/27/23: 5\' 4"  (1.626 m).   Weight as of 04/08/23: 145 lb 15.1 oz (66.2 kg).  LABS: CBC:    Component Value Date/Time   WBC 7.1 04/08/2023 0227   HGB 8.1 (L) 04/08/2023 0227   HGB 9.8 (L) 03/09/2023 1326   HCT 27.4 (L) 04/08/2023 0227   PLT 144 (L) 04/08/2023 0227   PLT 176 03/09/2023 1326   MCV 94.8  04/08/2023 0227   NEUTROABS 5.5 04/08/2023 0227   LYMPHSABS 0.6 (L) 04/08/2023 0227   MONOABS 0.6 04/08/2023 0227   EOSABS 0.2 04/08/2023 0227   BASOSABS 0.0 04/08/2023 0227   Comprehensive Metabolic Panel:    Component Value Date/Time   NA 137 04/08/2023 0227   NA 143 01/01/2023 1236   K 4.0 04/08/2023 0227   CL 106 04/08/2023 0227   CO2 24 04/08/2023 0227   BUN 42 (H) 04/08/2023 0227   BUN 57 (H) 01/01/2023 1236   CREATININE 3.42 (H) 04/08/2023 0227   CREATININE 3.34 (H) 03/09/2023 1326   GLUCOSE 118 (H) 04/08/2023 0227   CALCIUM 7.8 (L) 04/08/2023 0227   AST 7 (L) 04/08/2023 0227   AST 9 (L) 03/09/2023 1326   ALT 7 04/08/2023 0227   ALT 10 03/09/2023 1326   ALKPHOS 79 04/08/2023 0227   BILITOT 0.5 04/08/2023 0227   BILITOT 0.4 03/09/2023 1326   PROT 5.9 (L) 04/08/2023 0227   ALBUMIN 2.1 (L) 04/08/2023 0227    RADIOGRAPHIC STUDIES: CT HEAD WO CONTRAST ( )  Result Date: 02/20/2023 CLINICAL DATA:  Fall, metastatic cervical cancer EXAM: CT HEAD WITHOUT CONTRAST CT CERVICAL SPINE WITHOUT CONTRAST TECHNIQUE: Multidetector CT imaging of the head and cervical spine was performed following the standard protocol without intravenous contrast. Multiplanar CT image reconstructions of the  cervical spine were also generated. RADIATION DOSE REDUCTION: This exam was performed according to the departmental dose-optimization program which includes automated exposure control, adjustment of the mA and/or kV according to patient size and/or use of iterative reconstruction technique. COMPARISON:  None Available. FINDINGS: CT HEAD FINDINGS Brain: No evidence of acute infarction, hemorrhage, hydrocephalus, extra-axial collection or mass lesion/mass effect. Periventricular white matter hypodensity. Unchanged encephalomalacia of the posterior left centrum semiovale (series 3, image 18). Vascular: No hyperdense vessel or unexpected calcification. Skull: Normal. Negative for fracture or focal lesion. Sinuses/Orbits: No acute finding. Other: None. CT CERVICAL SPINE FINDINGS Alignment: Normal. Skull base and vertebrae: No acute fracture. No primary bone lesion or focal pathologic process. Soft tissues and spinal canal: No prevertebral fluid or swelling. No visible canal hematoma. Disc levels: Mild-to-moderate multilevel disc degenerative disease, worst at C6-C7. Upper chest: Masslike perihilar consolidations of the lungs, incompletely imaged (series 11, image 12). Other: None. IMPRESSION: 1. No acute intracranial pathology. 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 2. No fracture or static subluxation of the cervical spine. 3. Mild-to-moderate multilevel disc degenerative disease, worst at C6-C7. 4. Masslike perihilar consolidations of the lungs, incompletely imaged. Please see recent dedicated imaging of the chest Electronically Signed   By: Jearld Lesch M.D.   On: 02/20/2023 11:20   CT Cervical Spine Wo Contrast  Result Date: 02/20/2023 CLINICAL DATA:  Fall, metastatic cervical cancer EXAM: CT HEAD WITHOUT CONTRAST CT CERVICAL SPINE WITHOUT CONTRAST TECHNIQUE: Multidetector CT imaging of the head and cervical spine was performed following the  standard protocol without intravenous contrast. Multiplanar CT image reconstructions of the cervical spine were also generated. RADIATION DOSE REDUCTION: This exam was performed according to the departmental dose-optimization program which includes automated exposure control, adjustment of the mA and/or kV according to patient size and/or use of iterative reconstruction technique. COMPARISON:  None Available. FINDINGS: CT HEAD FINDINGS Brain: No evidence of acute infarction, hemorrhage, hydrocephalus, extra-axial collection or mass lesion/mass effect. Periventricular white matter hypodensity. Unchanged encephalomalacia of the posterior left centrum semiovale (series 3, image 18). Vascular: No hyperdense vessel or unexpected calcification. Skull: Normal. Negative  for fracture or focal lesion. Sinuses/Orbits: No acute finding. Other: None. CT CERVICAL SPINE FINDINGS Alignment: Normal. Skull base and vertebrae: No acute fracture. No primary bone lesion or focal pathologic process. Soft tissues and spinal canal: No prevertebral fluid or swelling. No visible canal hematoma. Disc levels: Mild-to-moderate multilevel disc degenerative disease, worst at C6-C7. Upper chest: Masslike perihilar consolidations of the lungs, incompletely imaged (series 11, image 12). Other: None. IMPRESSION: 1. No acute intracranial pathology. 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 2. No fracture or static subluxation of the cervical spine. 3. Mild-to-moderate multilevel disc degenerative disease, worst at C6-C7. 4. Masslike perihilar consolidations of the lungs, incompletely imaged. Please see recent dedicated imaging of the chest Electronically Signed   By: Jearld Lesch M.D.   On: 02/20/2023 11:20    PERFORMANCE STATUS (ECOG) : {CHL ONC ECOG OZ:[id number]}  Review of Systems Unless otherwise noted, a complete review of systems is negative.  Physical Exam General:  NAD Cardiovascular: regular rate and rhythm Pulmonary: clear ant fields Abdomen: soft, nontender, + bowel sounds Extremities: no edema, no joint deformities Skin: no rashes Neurological:  IMPRESSION: *** I introduced myself, Barlow Harrison RN, and Palliative's role in collaboration with the oncology team. Concept of Palliative Care was introduced as specialized medical care for people and their families living with serious illness.  It focuses on providing relief from the symptoms and stress of a serious illness.  The goal is to improve quality of life for both the patient and the family. Values and goals of care important to patient and family were attempted to be elicited.    We discussed *** current illness and what it means in the larger context of *** on-going co-morbidities. Natural disease trajectory and expectations were discussed.  I discussed the importance of continued conversation with family and their medical providers regarding overall plan of care and treatment options, ensuring decisions are within the context of the patients values and GOCs.  PLAN: Established therapeutic relationship. Education provided on palliative's role in collaboration with their Oncology/Radiation team. I will plan to see patient back in 2-4 weeks in collaboration to other oncology appointments.    Patient expressed understanding and was in agreement with this plan. She also understands that She can call the clinic at any time with any questions, concerns, or complaints.   Thank you for your referral and allowing Palliative to assist in Mrs. Joanie Coddington care.   Number and complexity of problems addressed: ***HIGH - 1 or more chronic illnesses with SEVERE exacerbation, progression, or side effects of treatment - advanced cancer, pain. Any controlled substances utilized were prescribed in the context of palliative care.   Visit consisted of counseling and education dealing with the complex and  emotionally intense issues of symptom management and palliative care in the setting of serious and potentially life-threatening illness.Greater than 50%  of this time was spent counseling and coordinating care related to the above assessment and plan.  Signed by: Willette Alma, AGPCNP-BC Palliative Medicine Team/[facility]   *Please note that this is a verbal dictation therefore any spelling or grammatical errors are due to the "[employer]agon [employer] One" system interpretation.

## 2023-04-10 ENCOUNTER — Ambulatory Visit: Payer: 59

## 2023-04-10 DIAGNOSIS — F32A Depression, unspecified: Secondary | ICD-10-CM | POA: Diagnosis not present

## 2023-04-10 DIAGNOSIS — N184 Chronic kidney disease, stage 4 (severe): Secondary | ICD-10-CM | POA: Diagnosis not present

## 2023-04-10 DIAGNOSIS — Z7901 Long term (current) use of anticoagulants: Secondary | ICD-10-CM | POA: Diagnosis not present

## 2023-04-10 DIAGNOSIS — Z86718 Personal history of other venous thrombosis and embolism: Secondary | ICD-10-CM | POA: Diagnosis not present

## 2023-04-10 DIAGNOSIS — N1339 Other hydronephrosis: Secondary | ICD-10-CM | POA: Diagnosis not present

## 2023-04-10 DIAGNOSIS — Z90710 Acquired absence of both cervix and uterus: Secondary | ICD-10-CM | POA: Diagnosis not present

## 2023-04-10 DIAGNOSIS — I129 Hypertensive chronic kidney disease with stage 1 through stage 4 chronic kidney disease, or unspecified chronic kidney disease: Secondary | ICD-10-CM | POA: Diagnosis not present

## 2023-04-10 DIAGNOSIS — E1122 Type 2 diabetes mellitus with diabetic chronic kidney disease: Secondary | ICD-10-CM | POA: Diagnosis not present

## 2023-04-10 DIAGNOSIS — C7951 Secondary malignant neoplasm of bone: Secondary | ICD-10-CM | POA: Diagnosis not present

## 2023-04-10 DIAGNOSIS — Z794 Long term (current) use of insulin: Secondary | ICD-10-CM | POA: Diagnosis not present

## 2023-04-10 DIAGNOSIS — C7802 Secondary malignant neoplasm of left lung: Secondary | ICD-10-CM | POA: Diagnosis not present

## 2023-04-10 DIAGNOSIS — Z86711 Personal history of pulmonary embolism: Secondary | ICD-10-CM | POA: Diagnosis not present

## 2023-04-10 DIAGNOSIS — Z9689 Presence of other specified functional implants: Secondary | ICD-10-CM | POA: Diagnosis not present

## 2023-04-10 DIAGNOSIS — Z8542 Personal history of malignant neoplasm of other parts of uterus: Secondary | ICD-10-CM | POA: Diagnosis not present

## 2023-04-10 DIAGNOSIS — C7801 Secondary malignant neoplasm of right lung: Secondary | ICD-10-CM | POA: Diagnosis not present

## 2023-04-10 NOTE — Radiation Completion Notes (Signed)
Patient Name: Jaime Gomez, Jaime Gomez MRN: [id number] Date of Birth: 01-04-1966 Referring Physician: Arlan Organ, M.D. Date of Service: 2023-04-10 Radiation Oncologist: Arnette Schaumann, M.D. [facility]                             RADIATION ONCOLOGY END OF TREATMENT NOTE     Diagnosis: C79.51 Secondary malignant neoplasm of bone Staging on 2021-08-18: Endometrial cancer, FIGO stage IVB (HCC) T=cT3a, N=cNX, M=pM1 Intent: Palliative     ==========DELIVERED PLANS==========  First Treatment Date: 2023-04-05 - Last Treatment Date: 2023-04-09   Plan Name: Ext_Tibia_R Site: [place], Right Technique: 3D Mode: Photon Dose Per Fraction: 6 Gy Prescribed Dose (Delivered / Prescribed): 18 Gy / 18 Gy Prescribed Fxs (Delivered / Prescribed): 3 / 3     ==========ON TREATMENT VISIT DATES========== 2023-04-09     ==========UPCOMING VISITS==========       ==========APPENDIX - ON TREATMENT VISIT NOTES==========   See weekly On Treatment Notes in Epic for details.

## 2023-04-11 ENCOUNTER — Ambulatory Visit: Payer: 59

## 2023-04-11 DIAGNOSIS — N184 Chronic kidney disease, stage 4 (severe): Secondary | ICD-10-CM | POA: Diagnosis not present

## 2023-04-11 DIAGNOSIS — E1122 Type 2 diabetes mellitus with diabetic chronic kidney disease: Secondary | ICD-10-CM | POA: Diagnosis not present

## 2023-04-11 DIAGNOSIS — Z90710 Acquired absence of both cervix and uterus: Secondary | ICD-10-CM | POA: Diagnosis not present

## 2023-04-11 DIAGNOSIS — Z7901 Long term (current) use of anticoagulants: Secondary | ICD-10-CM | POA: Diagnosis not present

## 2023-04-11 DIAGNOSIS — Z86711 Personal history of pulmonary embolism: Secondary | ICD-10-CM | POA: Diagnosis not present

## 2023-04-11 DIAGNOSIS — C7951 Secondary malignant neoplasm of bone: Secondary | ICD-10-CM | POA: Diagnosis not present

## 2023-04-11 DIAGNOSIS — I129 Hypertensive chronic kidney disease with stage 1 through stage 4 chronic kidney disease, or unspecified chronic kidney disease: Secondary | ICD-10-CM | POA: Diagnosis not present

## 2023-04-11 DIAGNOSIS — Z794 Long term (current) use of insulin: Secondary | ICD-10-CM | POA: Diagnosis not present

## 2023-04-11 DIAGNOSIS — C7802 Secondary malignant neoplasm of left lung: Secondary | ICD-10-CM | POA: Diagnosis not present

## 2023-04-11 DIAGNOSIS — C7801 Secondary malignant neoplasm of right lung: Secondary | ICD-10-CM | POA: Diagnosis not present

## 2023-04-11 DIAGNOSIS — Z86718 Personal history of other venous thrombosis and embolism: Secondary | ICD-10-CM | POA: Diagnosis not present

## 2023-04-11 DIAGNOSIS — Z9689 Presence of other specified functional implants: Secondary | ICD-10-CM | POA: Diagnosis not present

## 2023-04-11 DIAGNOSIS — F32A Depression, unspecified: Secondary | ICD-10-CM | POA: Diagnosis not present

## 2023-04-11 DIAGNOSIS — N1339 Other hydronephrosis: Secondary | ICD-10-CM | POA: Diagnosis not present

## 2023-04-11 DIAGNOSIS — Z8542 Personal history of malignant neoplasm of other parts of uterus: Secondary | ICD-10-CM | POA: Diagnosis not present

## 2023-04-12 ENCOUNTER — Ambulatory Visit: Payer: 59

## 2023-04-12 DIAGNOSIS — N184 Chronic kidney disease, stage 4 (severe): Secondary | ICD-10-CM | POA: Diagnosis not present

## 2023-04-12 DIAGNOSIS — Z7901 Long term (current) use of anticoagulants: Secondary | ICD-10-CM | POA: Diagnosis not present

## 2023-04-12 DIAGNOSIS — Z794 Long term (current) use of insulin: Secondary | ICD-10-CM | POA: Diagnosis not present

## 2023-04-12 DIAGNOSIS — E1122 Type 2 diabetes mellitus with diabetic chronic kidney disease: Secondary | ICD-10-CM | POA: Diagnosis not present

## 2023-04-12 DIAGNOSIS — N1339 Other hydronephrosis: Secondary | ICD-10-CM | POA: Diagnosis not present

## 2023-04-12 DIAGNOSIS — C7801 Secondary malignant neoplasm of right lung: Secondary | ICD-10-CM | POA: Diagnosis not present

## 2023-04-12 DIAGNOSIS — C7802 Secondary malignant neoplasm of left lung: Secondary | ICD-10-CM | POA: Diagnosis not present

## 2023-04-12 DIAGNOSIS — Z8542 Personal history of malignant neoplasm of other parts of uterus: Secondary | ICD-10-CM | POA: Diagnosis not present

## 2023-04-12 DIAGNOSIS — C7951 Secondary malignant neoplasm of bone: Secondary | ICD-10-CM | POA: Diagnosis not present

## 2023-04-12 DIAGNOSIS — Z86718 Personal history of other venous thrombosis and embolism: Secondary | ICD-10-CM | POA: Diagnosis not present

## 2023-04-12 DIAGNOSIS — Z86711 Personal history of pulmonary embolism: Secondary | ICD-10-CM | POA: Diagnosis not present

## 2023-04-12 DIAGNOSIS — Z90710 Acquired absence of both cervix and uterus: Secondary | ICD-10-CM | POA: Diagnosis not present

## 2023-04-12 DIAGNOSIS — I129 Hypertensive chronic kidney disease with stage 1 through stage 4 chronic kidney disease, or unspecified chronic kidney disease: Secondary | ICD-10-CM | POA: Diagnosis not present

## 2023-04-12 DIAGNOSIS — F32A Depression, unspecified: Secondary | ICD-10-CM | POA: Diagnosis not present

## 2023-04-12 DIAGNOSIS — Z9689 Presence of other specified functional implants: Secondary | ICD-10-CM | POA: Diagnosis not present

## 2023-04-13 ENCOUNTER — Ambulatory Visit: Payer: 59

## 2023-04-13 DIAGNOSIS — Z86711 Personal history of pulmonary embolism: Secondary | ICD-10-CM | POA: Diagnosis not present

## 2023-04-13 DIAGNOSIS — C7951 Secondary malignant neoplasm of bone: Secondary | ICD-10-CM | POA: Diagnosis not present

## 2023-04-13 DIAGNOSIS — Z90710 Acquired absence of both cervix and uterus: Secondary | ICD-10-CM | POA: Diagnosis not present

## 2023-04-13 DIAGNOSIS — I129 Hypertensive chronic kidney disease with stage 1 through stage 4 chronic kidney disease, or unspecified chronic kidney disease: Secondary | ICD-10-CM | POA: Diagnosis not present

## 2023-04-13 DIAGNOSIS — Z794 Long term (current) use of insulin: Secondary | ICD-10-CM | POA: Diagnosis not present

## 2023-04-13 DIAGNOSIS — C7801 Secondary malignant neoplasm of right lung: Secondary | ICD-10-CM | POA: Diagnosis not present

## 2023-04-13 DIAGNOSIS — N1339 Other hydronephrosis: Secondary | ICD-10-CM | POA: Diagnosis not present

## 2023-04-13 DIAGNOSIS — C7802 Secondary malignant neoplasm of left lung: Secondary | ICD-10-CM | POA: Diagnosis not present

## 2023-04-13 DIAGNOSIS — Z86718 Personal history of other venous thrombosis and embolism: Secondary | ICD-10-CM | POA: Diagnosis not present

## 2023-04-13 DIAGNOSIS — Z7901 Long term (current) use of anticoagulants: Secondary | ICD-10-CM | POA: Diagnosis not present

## 2023-04-13 DIAGNOSIS — Z8542 Personal history of malignant neoplasm of other parts of uterus: Secondary | ICD-10-CM | POA: Diagnosis not present

## 2023-04-13 DIAGNOSIS — E1122 Type 2 diabetes mellitus with diabetic chronic kidney disease: Secondary | ICD-10-CM | POA: Diagnosis not present

## 2023-04-13 DIAGNOSIS — F32A Depression, unspecified: Secondary | ICD-10-CM | POA: Diagnosis not present

## 2023-04-13 DIAGNOSIS — N184 Chronic kidney disease, stage 4 (severe): Secondary | ICD-10-CM | POA: Diagnosis not present

## 2023-04-13 DIAGNOSIS — Z9689 Presence of other specified functional implants: Secondary | ICD-10-CM | POA: Diagnosis not present

## 2023-04-14 DIAGNOSIS — N184 Chronic kidney disease, stage 4 (severe): Secondary | ICD-10-CM | POA: Diagnosis not present

## 2023-04-14 DIAGNOSIS — E1122 Type 2 diabetes mellitus with diabetic chronic kidney disease: Secondary | ICD-10-CM | POA: Diagnosis not present

## 2023-04-14 DIAGNOSIS — C7801 Secondary malignant neoplasm of right lung: Secondary | ICD-10-CM | POA: Diagnosis not present

## 2023-04-14 DIAGNOSIS — I129 Hypertensive chronic kidney disease with stage 1 through stage 4 chronic kidney disease, or unspecified chronic kidney disease: Secondary | ICD-10-CM | POA: Diagnosis not present

## 2023-04-14 DIAGNOSIS — Z86711 Personal history of pulmonary embolism: Secondary | ICD-10-CM | POA: Diagnosis not present

## 2023-04-14 DIAGNOSIS — Z7901 Long term (current) use of anticoagulants: Secondary | ICD-10-CM | POA: Diagnosis not present

## 2023-04-14 DIAGNOSIS — Z86718 Personal history of other venous thrombosis and embolism: Secondary | ICD-10-CM | POA: Diagnosis not present

## 2023-04-14 DIAGNOSIS — Z90710 Acquired absence of both cervix and uterus: Secondary | ICD-10-CM | POA: Diagnosis not present

## 2023-04-14 DIAGNOSIS — Z794 Long term (current) use of insulin: Secondary | ICD-10-CM | POA: Diagnosis not present

## 2023-04-14 DIAGNOSIS — C7802 Secondary malignant neoplasm of left lung: Secondary | ICD-10-CM | POA: Diagnosis not present

## 2023-04-14 DIAGNOSIS — F32A Depression, unspecified: Secondary | ICD-10-CM | POA: Diagnosis not present

## 2023-04-14 DIAGNOSIS — C7951 Secondary malignant neoplasm of bone: Secondary | ICD-10-CM | POA: Diagnosis not present

## 2023-04-14 DIAGNOSIS — Z9689 Presence of other specified functional implants: Secondary | ICD-10-CM | POA: Diagnosis not present

## 2023-04-14 DIAGNOSIS — Z8542 Personal history of malignant neoplasm of other parts of uterus: Secondary | ICD-10-CM | POA: Diagnosis not present

## 2023-04-14 DIAGNOSIS — N1339 Other hydronephrosis: Secondary | ICD-10-CM | POA: Diagnosis not present

## 2023-04-15 DIAGNOSIS — I129 Hypertensive chronic kidney disease with stage 1 through stage 4 chronic kidney disease, or unspecified chronic kidney disease: Secondary | ICD-10-CM | POA: Diagnosis not present

## 2023-04-15 DIAGNOSIS — Z90710 Acquired absence of both cervix and uterus: Secondary | ICD-10-CM | POA: Diagnosis not present

## 2023-04-15 DIAGNOSIS — Z86711 Personal history of pulmonary embolism: Secondary | ICD-10-CM | POA: Diagnosis not present

## 2023-04-15 DIAGNOSIS — Z8542 Personal history of malignant neoplasm of other parts of uterus: Secondary | ICD-10-CM | POA: Diagnosis not present

## 2023-04-15 DIAGNOSIS — N1339 Other hydronephrosis: Secondary | ICD-10-CM | POA: Diagnosis not present

## 2023-04-15 DIAGNOSIS — E1122 Type 2 diabetes mellitus with diabetic chronic kidney disease: Secondary | ICD-10-CM | POA: Diagnosis not present

## 2023-04-15 DIAGNOSIS — F32A Depression, unspecified: Secondary | ICD-10-CM | POA: Diagnosis not present

## 2023-04-15 DIAGNOSIS — Z86718 Personal history of other venous thrombosis and embolism: Secondary | ICD-10-CM | POA: Diagnosis not present

## 2023-04-15 DIAGNOSIS — N184 Chronic kidney disease, stage 4 (severe): Secondary | ICD-10-CM | POA: Diagnosis not present

## 2023-04-15 DIAGNOSIS — C7951 Secondary malignant neoplasm of bone: Secondary | ICD-10-CM | POA: Diagnosis not present

## 2023-04-15 DIAGNOSIS — Z794 Long term (current) use of insulin: Secondary | ICD-10-CM | POA: Diagnosis not present

## 2023-04-15 DIAGNOSIS — Z7901 Long term (current) use of anticoagulants: Secondary | ICD-10-CM | POA: Diagnosis not present

## 2023-04-15 DIAGNOSIS — C7802 Secondary malignant neoplasm of left lung: Secondary | ICD-10-CM | POA: Diagnosis not present

## 2023-04-15 DIAGNOSIS — C7801 Secondary malignant neoplasm of right lung: Secondary | ICD-10-CM | POA: Diagnosis not present

## 2023-04-15 DIAGNOSIS — Z9689 Presence of other specified functional implants: Secondary | ICD-10-CM | POA: Diagnosis not present

## 2023-04-16 ENCOUNTER — Ambulatory Visit: Payer: 59

## 2023-04-16 DIAGNOSIS — C7801 Secondary malignant neoplasm of right lung: Secondary | ICD-10-CM | POA: Diagnosis not present

## 2023-04-16 DIAGNOSIS — N184 Chronic kidney disease, stage 4 (severe): Secondary | ICD-10-CM | POA: Diagnosis not present

## 2023-04-16 DIAGNOSIS — I129 Hypertensive chronic kidney disease with stage 1 through stage 4 chronic kidney disease, or unspecified chronic kidney disease: Secondary | ICD-10-CM | POA: Diagnosis not present

## 2023-04-16 DIAGNOSIS — Z9689 Presence of other specified functional implants: Secondary | ICD-10-CM | POA: Diagnosis not present

## 2023-04-16 DIAGNOSIS — F32A Depression, unspecified: Secondary | ICD-10-CM | POA: Diagnosis not present

## 2023-04-16 DIAGNOSIS — Z86718 Personal history of other venous thrombosis and embolism: Secondary | ICD-10-CM | POA: Diagnosis not present

## 2023-04-16 DIAGNOSIS — Z8542 Personal history of malignant neoplasm of other parts of uterus: Secondary | ICD-10-CM | POA: Diagnosis not present

## 2023-04-16 DIAGNOSIS — N1339 Other hydronephrosis: Secondary | ICD-10-CM | POA: Diagnosis not present

## 2023-04-16 DIAGNOSIS — E1122 Type 2 diabetes mellitus with diabetic chronic kidney disease: Secondary | ICD-10-CM | POA: Diagnosis not present

## 2023-04-16 DIAGNOSIS — Z90710 Acquired absence of both cervix and uterus: Secondary | ICD-10-CM | POA: Diagnosis not present

## 2023-04-16 DIAGNOSIS — C7802 Secondary malignant neoplasm of left lung: Secondary | ICD-10-CM | POA: Diagnosis not present

## 2023-04-16 DIAGNOSIS — Z7901 Long term (current) use of anticoagulants: Secondary | ICD-10-CM | POA: Diagnosis not present

## 2023-04-16 DIAGNOSIS — Z86711 Personal history of pulmonary embolism: Secondary | ICD-10-CM | POA: Diagnosis not present

## 2023-04-16 DIAGNOSIS — Z794 Long term (current) use of insulin: Secondary | ICD-10-CM | POA: Diagnosis not present

## 2023-04-16 DIAGNOSIS — C7951 Secondary malignant neoplasm of bone: Secondary | ICD-10-CM | POA: Diagnosis not present

## 2023-04-17 ENCOUNTER — Ambulatory Visit: Payer: 59

## 2023-04-17 DIAGNOSIS — Z9689 Presence of other specified functional implants: Secondary | ICD-10-CM | POA: Diagnosis not present

## 2023-04-17 DIAGNOSIS — Z7901 Long term (current) use of anticoagulants: Secondary | ICD-10-CM | POA: Diagnosis not present

## 2023-04-17 DIAGNOSIS — F32A Depression, unspecified: Secondary | ICD-10-CM | POA: Diagnosis not present

## 2023-04-17 DIAGNOSIS — Z86711 Personal history of pulmonary embolism: Secondary | ICD-10-CM | POA: Diagnosis not present

## 2023-04-17 DIAGNOSIS — C7801 Secondary malignant neoplasm of right lung: Secondary | ICD-10-CM | POA: Diagnosis not present

## 2023-04-17 DIAGNOSIS — C7951 Secondary malignant neoplasm of bone: Secondary | ICD-10-CM | POA: Diagnosis not present

## 2023-04-17 DIAGNOSIS — Z90710 Acquired absence of both cervix and uterus: Secondary | ICD-10-CM | POA: Diagnosis not present

## 2023-04-17 DIAGNOSIS — I129 Hypertensive chronic kidney disease with stage 1 through stage 4 chronic kidney disease, or unspecified chronic kidney disease: Secondary | ICD-10-CM | POA: Diagnosis not present

## 2023-04-17 DIAGNOSIS — C7802 Secondary malignant neoplasm of left lung: Secondary | ICD-10-CM | POA: Diagnosis not present

## 2023-04-17 DIAGNOSIS — N1339 Other hydronephrosis: Secondary | ICD-10-CM | POA: Diagnosis not present

## 2023-04-17 DIAGNOSIS — N184 Chronic kidney disease, stage 4 (severe): Secondary | ICD-10-CM | POA: Diagnosis not present

## 2023-04-17 DIAGNOSIS — E1122 Type 2 diabetes mellitus with diabetic chronic kidney disease: Secondary | ICD-10-CM | POA: Diagnosis not present

## 2023-04-17 DIAGNOSIS — Z86718 Personal history of other venous thrombosis and embolism: Secondary | ICD-10-CM | POA: Diagnosis not present

## 2023-04-17 DIAGNOSIS — Z8542 Personal history of malignant neoplasm of other parts of uterus: Secondary | ICD-10-CM | POA: Diagnosis not present

## 2023-04-17 DIAGNOSIS — Z794 Long term (current) use of insulin: Secondary | ICD-10-CM | POA: Diagnosis not present

## 2023-04-18 ENCOUNTER — Ambulatory Visit: Payer: 59

## 2023-04-18 DIAGNOSIS — I129 Hypertensive chronic kidney disease with stage 1 through stage 4 chronic kidney disease, or unspecified chronic kidney disease: Secondary | ICD-10-CM | POA: Diagnosis not present

## 2023-04-18 DIAGNOSIS — F32A Depression, unspecified: Secondary | ICD-10-CM | POA: Diagnosis not present

## 2023-04-18 DIAGNOSIS — Z86711 Personal history of pulmonary embolism: Secondary | ICD-10-CM | POA: Diagnosis not present

## 2023-04-18 DIAGNOSIS — Z8542 Personal history of malignant neoplasm of other parts of uterus: Secondary | ICD-10-CM | POA: Diagnosis not present

## 2023-04-18 DIAGNOSIS — Z794 Long term (current) use of insulin: Secondary | ICD-10-CM | POA: Diagnosis not present

## 2023-04-18 DIAGNOSIS — C7802 Secondary malignant neoplasm of left lung: Secondary | ICD-10-CM | POA: Diagnosis not present

## 2023-04-18 DIAGNOSIS — N1339 Other hydronephrosis: Secondary | ICD-10-CM | POA: Diagnosis not present

## 2023-04-18 DIAGNOSIS — E1122 Type 2 diabetes mellitus with diabetic chronic kidney disease: Secondary | ICD-10-CM | POA: Diagnosis not present

## 2023-04-18 DIAGNOSIS — C7801 Secondary malignant neoplasm of right lung: Secondary | ICD-10-CM | POA: Diagnosis not present

## 2023-04-18 DIAGNOSIS — Z90710 Acquired absence of both cervix and uterus: Secondary | ICD-10-CM | POA: Diagnosis not present

## 2023-04-18 DIAGNOSIS — Z7901 Long term (current) use of anticoagulants: Secondary | ICD-10-CM | POA: Diagnosis not present

## 2023-04-18 DIAGNOSIS — Z9689 Presence of other specified functional implants: Secondary | ICD-10-CM | POA: Diagnosis not present

## 2023-04-18 DIAGNOSIS — N184 Chronic kidney disease, stage 4 (severe): Secondary | ICD-10-CM | POA: Diagnosis not present

## 2023-04-18 DIAGNOSIS — C7951 Secondary malignant neoplasm of bone: Secondary | ICD-10-CM | POA: Diagnosis not present

## 2023-04-18 DIAGNOSIS — Z86718 Personal history of other venous thrombosis and embolism: Secondary | ICD-10-CM | POA: Diagnosis not present

## 2023-04-19 DIAGNOSIS — E1122 Type 2 diabetes mellitus with diabetic chronic kidney disease: Secondary | ICD-10-CM | POA: Diagnosis not present

## 2023-04-19 DIAGNOSIS — C7802 Secondary malignant neoplasm of left lung: Secondary | ICD-10-CM | POA: Diagnosis not present

## 2023-04-19 DIAGNOSIS — Z8542 Personal history of malignant neoplasm of other parts of uterus: Secondary | ICD-10-CM | POA: Diagnosis not present

## 2023-04-19 DIAGNOSIS — Z7901 Long term (current) use of anticoagulants: Secondary | ICD-10-CM | POA: Diagnosis not present

## 2023-04-19 DIAGNOSIS — Z86718 Personal history of other venous thrombosis and embolism: Secondary | ICD-10-CM | POA: Diagnosis not present

## 2023-04-19 DIAGNOSIS — F32A Depression, unspecified: Secondary | ICD-10-CM | POA: Diagnosis not present

## 2023-04-19 DIAGNOSIS — N184 Chronic kidney disease, stage 4 (severe): Secondary | ICD-10-CM | POA: Diagnosis not present

## 2023-04-19 DIAGNOSIS — Z86711 Personal history of pulmonary embolism: Secondary | ICD-10-CM | POA: Diagnosis not present

## 2023-04-19 DIAGNOSIS — I129 Hypertensive chronic kidney disease with stage 1 through stage 4 chronic kidney disease, or unspecified chronic kidney disease: Secondary | ICD-10-CM | POA: Diagnosis not present

## 2023-04-19 DIAGNOSIS — C7951 Secondary malignant neoplasm of bone: Secondary | ICD-10-CM | POA: Diagnosis not present

## 2023-04-19 DIAGNOSIS — Z794 Long term (current) use of insulin: Secondary | ICD-10-CM | POA: Diagnosis not present

## 2023-04-19 DIAGNOSIS — Z9689 Presence of other specified functional implants: Secondary | ICD-10-CM | POA: Diagnosis not present

## 2023-04-19 DIAGNOSIS — Z90710 Acquired absence of both cervix and uterus: Secondary | ICD-10-CM | POA: Diagnosis not present

## 2023-04-19 DIAGNOSIS — N1339 Other hydronephrosis: Secondary | ICD-10-CM | POA: Diagnosis not present

## 2023-04-19 DIAGNOSIS — C7801 Secondary malignant neoplasm of right lung: Secondary | ICD-10-CM | POA: Diagnosis not present

## 2023-04-20 DIAGNOSIS — N1339 Other hydronephrosis: Secondary | ICD-10-CM | POA: Diagnosis not present

## 2023-04-20 DIAGNOSIS — Z86711 Personal history of pulmonary embolism: Secondary | ICD-10-CM | POA: Diagnosis not present

## 2023-04-20 DIAGNOSIS — Z9689 Presence of other specified functional implants: Secondary | ICD-10-CM | POA: Diagnosis not present

## 2023-04-20 DIAGNOSIS — C7951 Secondary malignant neoplasm of bone: Secondary | ICD-10-CM | POA: Diagnosis not present

## 2023-04-20 DIAGNOSIS — I129 Hypertensive chronic kidney disease with stage 1 through stage 4 chronic kidney disease, or unspecified chronic kidney disease: Secondary | ICD-10-CM | POA: Diagnosis not present

## 2023-04-20 DIAGNOSIS — E1122 Type 2 diabetes mellitus with diabetic chronic kidney disease: Secondary | ICD-10-CM | POA: Diagnosis not present

## 2023-04-20 DIAGNOSIS — Z794 Long term (current) use of insulin: Secondary | ICD-10-CM | POA: Diagnosis not present

## 2023-04-20 DIAGNOSIS — C7801 Secondary malignant neoplasm of right lung: Secondary | ICD-10-CM | POA: Diagnosis not present

## 2023-04-20 DIAGNOSIS — Z7901 Long term (current) use of anticoagulants: Secondary | ICD-10-CM | POA: Diagnosis not present

## 2023-04-20 DIAGNOSIS — N184 Chronic kidney disease, stage 4 (severe): Secondary | ICD-10-CM | POA: Diagnosis not present

## 2023-04-20 DIAGNOSIS — C7802 Secondary malignant neoplasm of left lung: Secondary | ICD-10-CM | POA: Diagnosis not present

## 2023-04-20 DIAGNOSIS — Z8542 Personal history of malignant neoplasm of other parts of uterus: Secondary | ICD-10-CM | POA: Diagnosis not present

## 2023-04-20 DIAGNOSIS — F32A Depression, unspecified: Secondary | ICD-10-CM | POA: Diagnosis not present

## 2023-04-20 DIAGNOSIS — Z90710 Acquired absence of both cervix and uterus: Secondary | ICD-10-CM | POA: Diagnosis not present

## 2023-04-20 DIAGNOSIS — Z86718 Personal history of other venous thrombosis and embolism: Secondary | ICD-10-CM | POA: Diagnosis not present

## 2023-04-21 DIAGNOSIS — Z7901 Long term (current) use of anticoagulants: Secondary | ICD-10-CM | POA: Diagnosis not present

## 2023-04-21 DIAGNOSIS — E1122 Type 2 diabetes mellitus with diabetic chronic kidney disease: Secondary | ICD-10-CM | POA: Diagnosis not present

## 2023-04-21 DIAGNOSIS — F32A Depression, unspecified: Secondary | ICD-10-CM | POA: Diagnosis not present

## 2023-04-21 DIAGNOSIS — C7951 Secondary malignant neoplasm of bone: Secondary | ICD-10-CM | POA: Diagnosis not present

## 2023-04-21 DIAGNOSIS — Z90710 Acquired absence of both cervix and uterus: Secondary | ICD-10-CM | POA: Diagnosis not present

## 2023-04-21 DIAGNOSIS — Z8542 Personal history of malignant neoplasm of other parts of uterus: Secondary | ICD-10-CM | POA: Diagnosis not present

## 2023-04-21 DIAGNOSIS — Z86711 Personal history of pulmonary embolism: Secondary | ICD-10-CM | POA: Diagnosis not present

## 2023-04-21 DIAGNOSIS — Z9689 Presence of other specified functional implants: Secondary | ICD-10-CM | POA: Diagnosis not present

## 2023-04-21 DIAGNOSIS — N184 Chronic kidney disease, stage 4 (severe): Secondary | ICD-10-CM | POA: Diagnosis not present

## 2023-04-21 DIAGNOSIS — Z794 Long term (current) use of insulin: Secondary | ICD-10-CM | POA: Diagnosis not present

## 2023-04-21 DIAGNOSIS — I129 Hypertensive chronic kidney disease with stage 1 through stage 4 chronic kidney disease, or unspecified chronic kidney disease: Secondary | ICD-10-CM | POA: Diagnosis not present

## 2023-04-21 DIAGNOSIS — C7802 Secondary malignant neoplasm of left lung: Secondary | ICD-10-CM | POA: Diagnosis not present

## 2023-04-21 DIAGNOSIS — N1339 Other hydronephrosis: Secondary | ICD-10-CM | POA: Diagnosis not present

## 2023-04-21 DIAGNOSIS — C7801 Secondary malignant neoplasm of right lung: Secondary | ICD-10-CM | POA: Diagnosis not present

## 2023-04-21 DIAGNOSIS — Z86718 Personal history of other venous thrombosis and embolism: Secondary | ICD-10-CM | POA: Diagnosis not present

## 2023-04-22 DIAGNOSIS — C7802 Secondary malignant neoplasm of left lung: Secondary | ICD-10-CM | POA: Diagnosis not present

## 2023-04-22 DIAGNOSIS — N184 Chronic kidney disease, stage 4 (severe): Secondary | ICD-10-CM | POA: Diagnosis not present

## 2023-04-22 DIAGNOSIS — Z9689 Presence of other specified functional implants: Secondary | ICD-10-CM | POA: Diagnosis not present

## 2023-04-22 DIAGNOSIS — Z86718 Personal history of other venous thrombosis and embolism: Secondary | ICD-10-CM | POA: Diagnosis not present

## 2023-04-22 DIAGNOSIS — N1339 Other hydronephrosis: Secondary | ICD-10-CM | POA: Diagnosis not present

## 2023-04-22 DIAGNOSIS — Z8542 Personal history of malignant neoplasm of other parts of uterus: Secondary | ICD-10-CM | POA: Diagnosis not present

## 2023-04-22 DIAGNOSIS — Z90710 Acquired absence of both cervix and uterus: Secondary | ICD-10-CM | POA: Diagnosis not present

## 2023-04-22 DIAGNOSIS — Z794 Long term (current) use of insulin: Secondary | ICD-10-CM | POA: Diagnosis not present

## 2023-04-22 DIAGNOSIS — E1122 Type 2 diabetes mellitus with diabetic chronic kidney disease: Secondary | ICD-10-CM | POA: Diagnosis not present

## 2023-04-22 DIAGNOSIS — C7951 Secondary malignant neoplasm of bone: Secondary | ICD-10-CM | POA: Diagnosis not present

## 2023-04-22 DIAGNOSIS — F32A Depression, unspecified: Secondary | ICD-10-CM | POA: Diagnosis not present

## 2023-04-22 DIAGNOSIS — Z7901 Long term (current) use of anticoagulants: Secondary | ICD-10-CM | POA: Diagnosis not present

## 2023-04-22 DIAGNOSIS — I129 Hypertensive chronic kidney disease with stage 1 through stage 4 chronic kidney disease, or unspecified chronic kidney disease: Secondary | ICD-10-CM | POA: Diagnosis not present

## 2023-04-22 DIAGNOSIS — C7801 Secondary malignant neoplasm of right lung: Secondary | ICD-10-CM | POA: Diagnosis not present

## 2023-04-22 DIAGNOSIS — Z86711 Personal history of pulmonary embolism: Secondary | ICD-10-CM | POA: Diagnosis not present

## 2023-04-23 DIAGNOSIS — Z794 Long term (current) use of insulin: Secondary | ICD-10-CM | POA: Diagnosis not present

## 2023-04-23 DIAGNOSIS — Z7901 Long term (current) use of anticoagulants: Secondary | ICD-10-CM | POA: Diagnosis not present

## 2023-04-23 DIAGNOSIS — N184 Chronic kidney disease, stage 4 (severe): Secondary | ICD-10-CM | POA: Diagnosis not present

## 2023-04-23 DIAGNOSIS — Z90710 Acquired absence of both cervix and uterus: Secondary | ICD-10-CM | POA: Diagnosis not present

## 2023-04-23 DIAGNOSIS — Z86711 Personal history of pulmonary embolism: Secondary | ICD-10-CM | POA: Diagnosis not present

## 2023-04-23 DIAGNOSIS — Z8542 Personal history of malignant neoplasm of other parts of uterus: Secondary | ICD-10-CM | POA: Diagnosis not present

## 2023-04-23 DIAGNOSIS — C7951 Secondary malignant neoplasm of bone: Secondary | ICD-10-CM | POA: Diagnosis not present

## 2023-04-23 DIAGNOSIS — Z86718 Personal history of other venous thrombosis and embolism: Secondary | ICD-10-CM | POA: Diagnosis not present

## 2023-04-23 DIAGNOSIS — Z9689 Presence of other specified functional implants: Secondary | ICD-10-CM | POA: Diagnosis not present

## 2023-04-23 DIAGNOSIS — N1339 Other hydronephrosis: Secondary | ICD-10-CM | POA: Diagnosis not present

## 2023-04-23 DIAGNOSIS — I129 Hypertensive chronic kidney disease with stage 1 through stage 4 chronic kidney disease, or unspecified chronic kidney disease: Secondary | ICD-10-CM | POA: Diagnosis not present

## 2023-04-23 DIAGNOSIS — C7801 Secondary malignant neoplasm of right lung: Secondary | ICD-10-CM | POA: Diagnosis not present

## 2023-04-23 DIAGNOSIS — F32A Depression, unspecified: Secondary | ICD-10-CM | POA: Diagnosis not present

## 2023-04-23 DIAGNOSIS — C7802 Secondary malignant neoplasm of left lung: Secondary | ICD-10-CM | POA: Diagnosis not present

## 2023-04-23 DIAGNOSIS — E1122 Type 2 diabetes mellitus with diabetic chronic kidney disease: Secondary | ICD-10-CM | POA: Diagnosis not present

## 2023-04-24 DIAGNOSIS — C7951 Secondary malignant neoplasm of bone: Secondary | ICD-10-CM | POA: Diagnosis not present

## 2023-04-24 DIAGNOSIS — Z9689 Presence of other specified functional implants: Secondary | ICD-10-CM | POA: Diagnosis not present

## 2023-04-24 DIAGNOSIS — Z7901 Long term (current) use of anticoagulants: Secondary | ICD-10-CM | POA: Diagnosis not present

## 2023-04-24 DIAGNOSIS — E1122 Type 2 diabetes mellitus with diabetic chronic kidney disease: Secondary | ICD-10-CM | POA: Diagnosis not present

## 2023-04-24 DIAGNOSIS — I129 Hypertensive chronic kidney disease with stage 1 through stage 4 chronic kidney disease, or unspecified chronic kidney disease: Secondary | ICD-10-CM | POA: Diagnosis not present

## 2023-04-24 DIAGNOSIS — Z794 Long term (current) use of insulin: Secondary | ICD-10-CM | POA: Diagnosis not present

## 2023-04-24 DIAGNOSIS — Z86711 Personal history of pulmonary embolism: Secondary | ICD-10-CM | POA: Diagnosis not present

## 2023-04-24 DIAGNOSIS — N184 Chronic kidney disease, stage 4 (severe): Secondary | ICD-10-CM | POA: Diagnosis not present

## 2023-04-24 DIAGNOSIS — Z86718 Personal history of other venous thrombosis and embolism: Secondary | ICD-10-CM | POA: Diagnosis not present

## 2023-04-24 DIAGNOSIS — Z8542 Personal history of malignant neoplasm of other parts of uterus: Secondary | ICD-10-CM | POA: Diagnosis not present

## 2023-04-24 DIAGNOSIS — C7801 Secondary malignant neoplasm of right lung: Secondary | ICD-10-CM | POA: Diagnosis not present

## 2023-04-24 DIAGNOSIS — C7802 Secondary malignant neoplasm of left lung: Secondary | ICD-10-CM | POA: Diagnosis not present

## 2023-04-24 DIAGNOSIS — F32A Depression, unspecified: Secondary | ICD-10-CM | POA: Diagnosis not present

## 2023-04-24 DIAGNOSIS — N1339 Other hydronephrosis: Secondary | ICD-10-CM | POA: Diagnosis not present

## 2023-04-24 DIAGNOSIS — Z90710 Acquired absence of both cervix and uterus: Secondary | ICD-10-CM | POA: Diagnosis not present

## 2023-04-25 DIAGNOSIS — E1122 Type 2 diabetes mellitus with diabetic chronic kidney disease: Secondary | ICD-10-CM | POA: Diagnosis not present

## 2023-04-25 DIAGNOSIS — N1339 Other hydronephrosis: Secondary | ICD-10-CM | POA: Diagnosis not present

## 2023-04-25 DIAGNOSIS — Z9689 Presence of other specified functional implants: Secondary | ICD-10-CM | POA: Diagnosis not present

## 2023-04-25 DIAGNOSIS — C7802 Secondary malignant neoplasm of left lung: Secondary | ICD-10-CM | POA: Diagnosis not present

## 2023-04-25 DIAGNOSIS — N184 Chronic kidney disease, stage 4 (severe): Secondary | ICD-10-CM | POA: Diagnosis not present

## 2023-04-25 DIAGNOSIS — Z86718 Personal history of other venous thrombosis and embolism: Secondary | ICD-10-CM | POA: Diagnosis not present

## 2023-04-25 DIAGNOSIS — Z794 Long term (current) use of insulin: Secondary | ICD-10-CM | POA: Diagnosis not present

## 2023-04-25 DIAGNOSIS — C7951 Secondary malignant neoplasm of bone: Secondary | ICD-10-CM | POA: Diagnosis not present

## 2023-04-25 DIAGNOSIS — C7801 Secondary malignant neoplasm of right lung: Secondary | ICD-10-CM | POA: Diagnosis not present

## 2023-04-25 DIAGNOSIS — Z8542 Personal history of malignant neoplasm of other parts of uterus: Secondary | ICD-10-CM | POA: Diagnosis not present

## 2023-04-25 DIAGNOSIS — Z90710 Acquired absence of both cervix and uterus: Secondary | ICD-10-CM | POA: Diagnosis not present

## 2023-04-25 DIAGNOSIS — F32A Depression, unspecified: Secondary | ICD-10-CM | POA: Diagnosis not present

## 2023-04-25 DIAGNOSIS — Z86711 Personal history of pulmonary embolism: Secondary | ICD-10-CM | POA: Diagnosis not present

## 2023-04-25 DIAGNOSIS — I129 Hypertensive chronic kidney disease with stage 1 through stage 4 chronic kidney disease, or unspecified chronic kidney disease: Secondary | ICD-10-CM | POA: Diagnosis not present

## 2023-04-25 DIAGNOSIS — Z7901 Long term (current) use of anticoagulants: Secondary | ICD-10-CM | POA: Diagnosis not present

## 2023-04-26 DIAGNOSIS — I129 Hypertensive chronic kidney disease with stage 1 through stage 4 chronic kidney disease, or unspecified chronic kidney disease: Secondary | ICD-10-CM | POA: Diagnosis not present

## 2023-04-26 DIAGNOSIS — Z86711 Personal history of pulmonary embolism: Secondary | ICD-10-CM | POA: Diagnosis not present

## 2023-04-26 DIAGNOSIS — Z794 Long term (current) use of insulin: Secondary | ICD-10-CM | POA: Diagnosis not present

## 2023-04-26 DIAGNOSIS — E1122 Type 2 diabetes mellitus with diabetic chronic kidney disease: Secondary | ICD-10-CM | POA: Diagnosis not present

## 2023-04-26 DIAGNOSIS — Z7901 Long term (current) use of anticoagulants: Secondary | ICD-10-CM | POA: Diagnosis not present

## 2023-04-26 DIAGNOSIS — Z8542 Personal history of malignant neoplasm of other parts of uterus: Secondary | ICD-10-CM | POA: Diagnosis not present

## 2023-04-26 DIAGNOSIS — Z86718 Personal history of other venous thrombosis and embolism: Secondary | ICD-10-CM | POA: Diagnosis not present

## 2023-04-26 DIAGNOSIS — N1339 Other hydronephrosis: Secondary | ICD-10-CM | POA: Diagnosis not present

## 2023-04-26 DIAGNOSIS — C7801 Secondary malignant neoplasm of right lung: Secondary | ICD-10-CM | POA: Diagnosis not present

## 2023-04-26 DIAGNOSIS — Z9689 Presence of other specified functional implants: Secondary | ICD-10-CM | POA: Diagnosis not present

## 2023-04-26 DIAGNOSIS — Z90710 Acquired absence of both cervix and uterus: Secondary | ICD-10-CM | POA: Diagnosis not present

## 2023-04-26 DIAGNOSIS — N184 Chronic kidney disease, stage 4 (severe): Secondary | ICD-10-CM | POA: Diagnosis not present

## 2023-04-26 DIAGNOSIS — F32A Depression, unspecified: Secondary | ICD-10-CM | POA: Diagnosis not present

## 2023-04-26 DIAGNOSIS — C7951 Secondary malignant neoplasm of bone: Secondary | ICD-10-CM | POA: Diagnosis not present

## 2023-04-26 DIAGNOSIS — C7802 Secondary malignant neoplasm of left lung: Secondary | ICD-10-CM | POA: Diagnosis not present

## 2023-05-22 ENCOUNTER — Telehealth: Payer: Self-pay

## 2023-05-22 NOTE — Telephone Encounter (Signed)
Received card in the mail from pt's sister that included a memorial card stating pt passed away on 2023-05-06. No official notification had been given to Dr Myna Hidalgo. MD will call pt's sister. dph

## 2023-05-25 DEATH — deceased

## 2023-07-15 IMAGING — DX DG CHEST 2V
2 series · 2 of 2 positions shown · non-contrast
Comparison: None.

CLINICAL DATA: Chest port placement

EXAM:
CHEST - 2 VIEW

[chest pa]
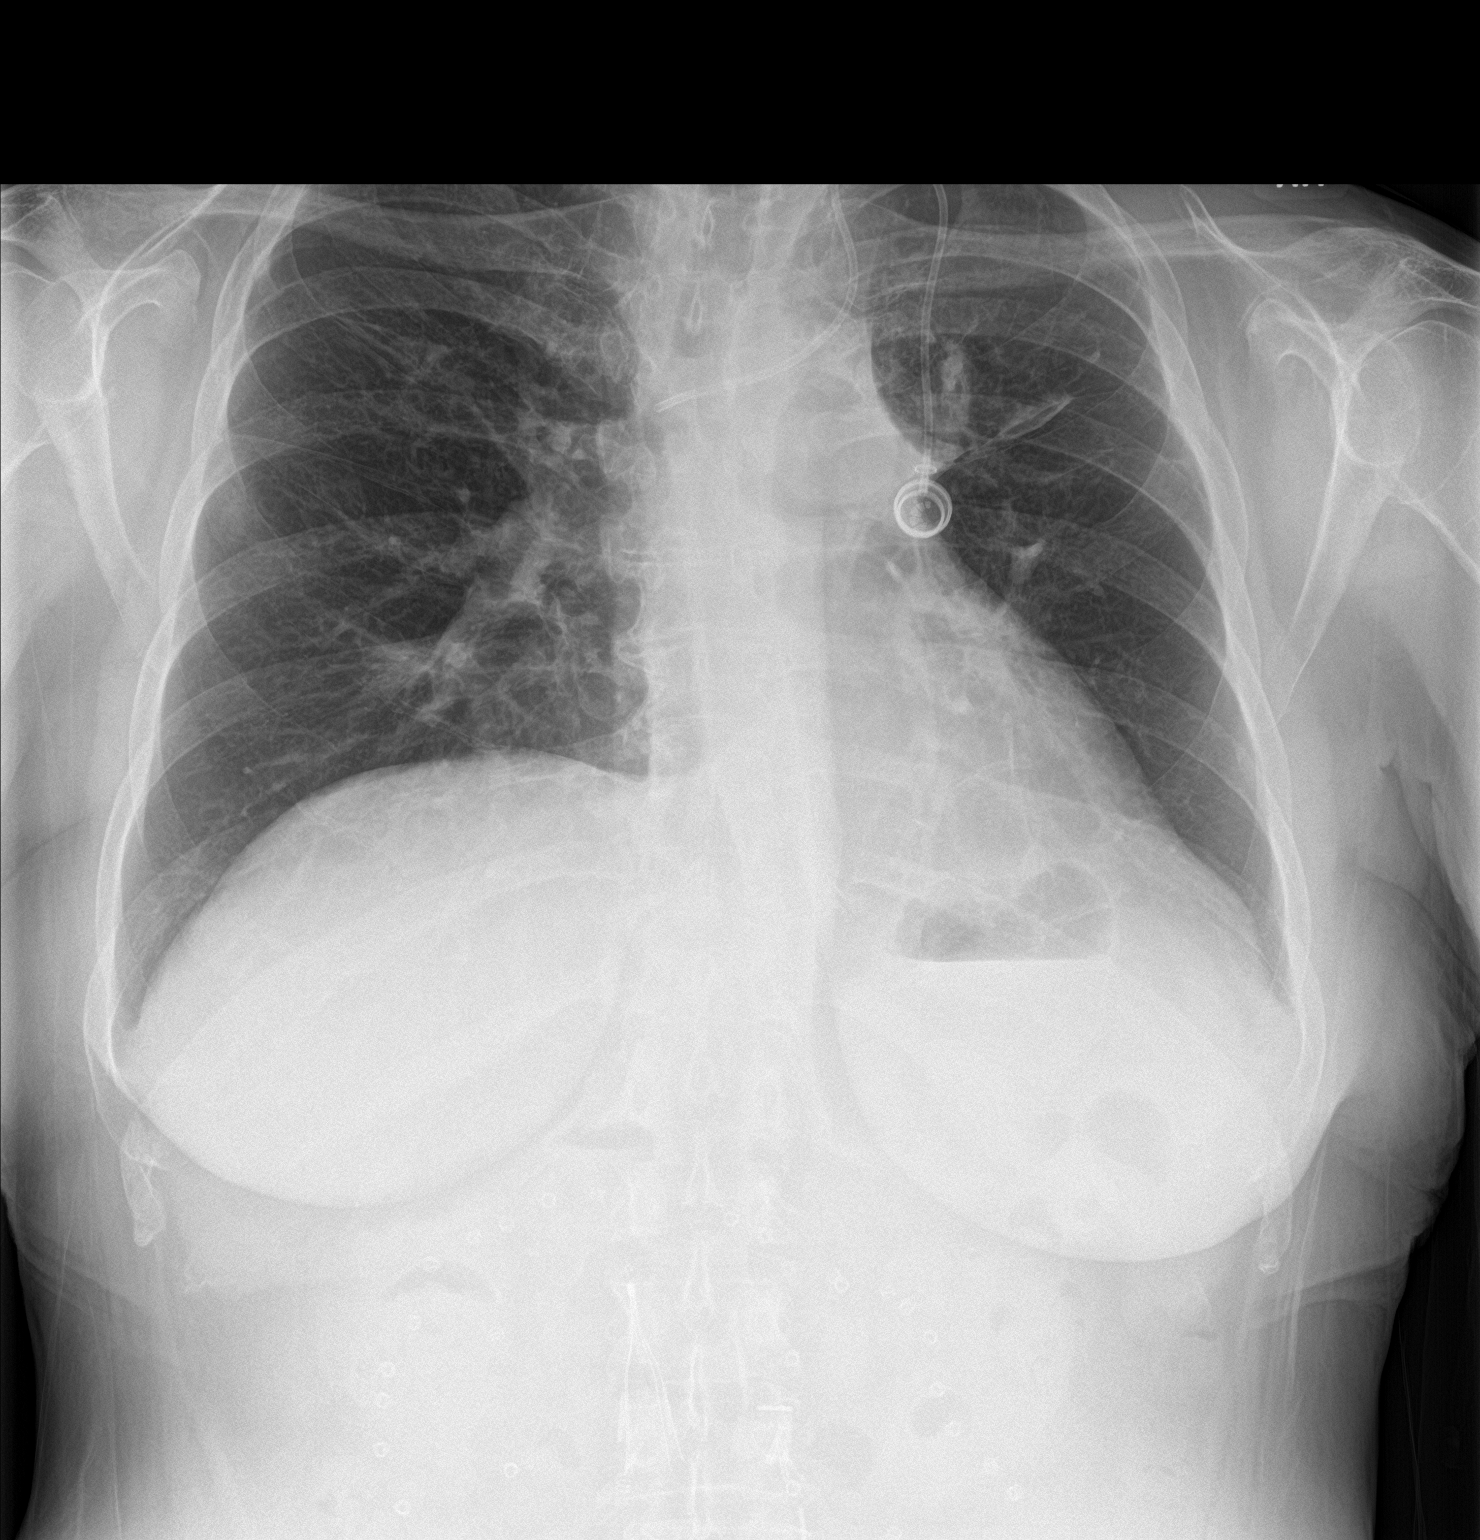

[chest lat]
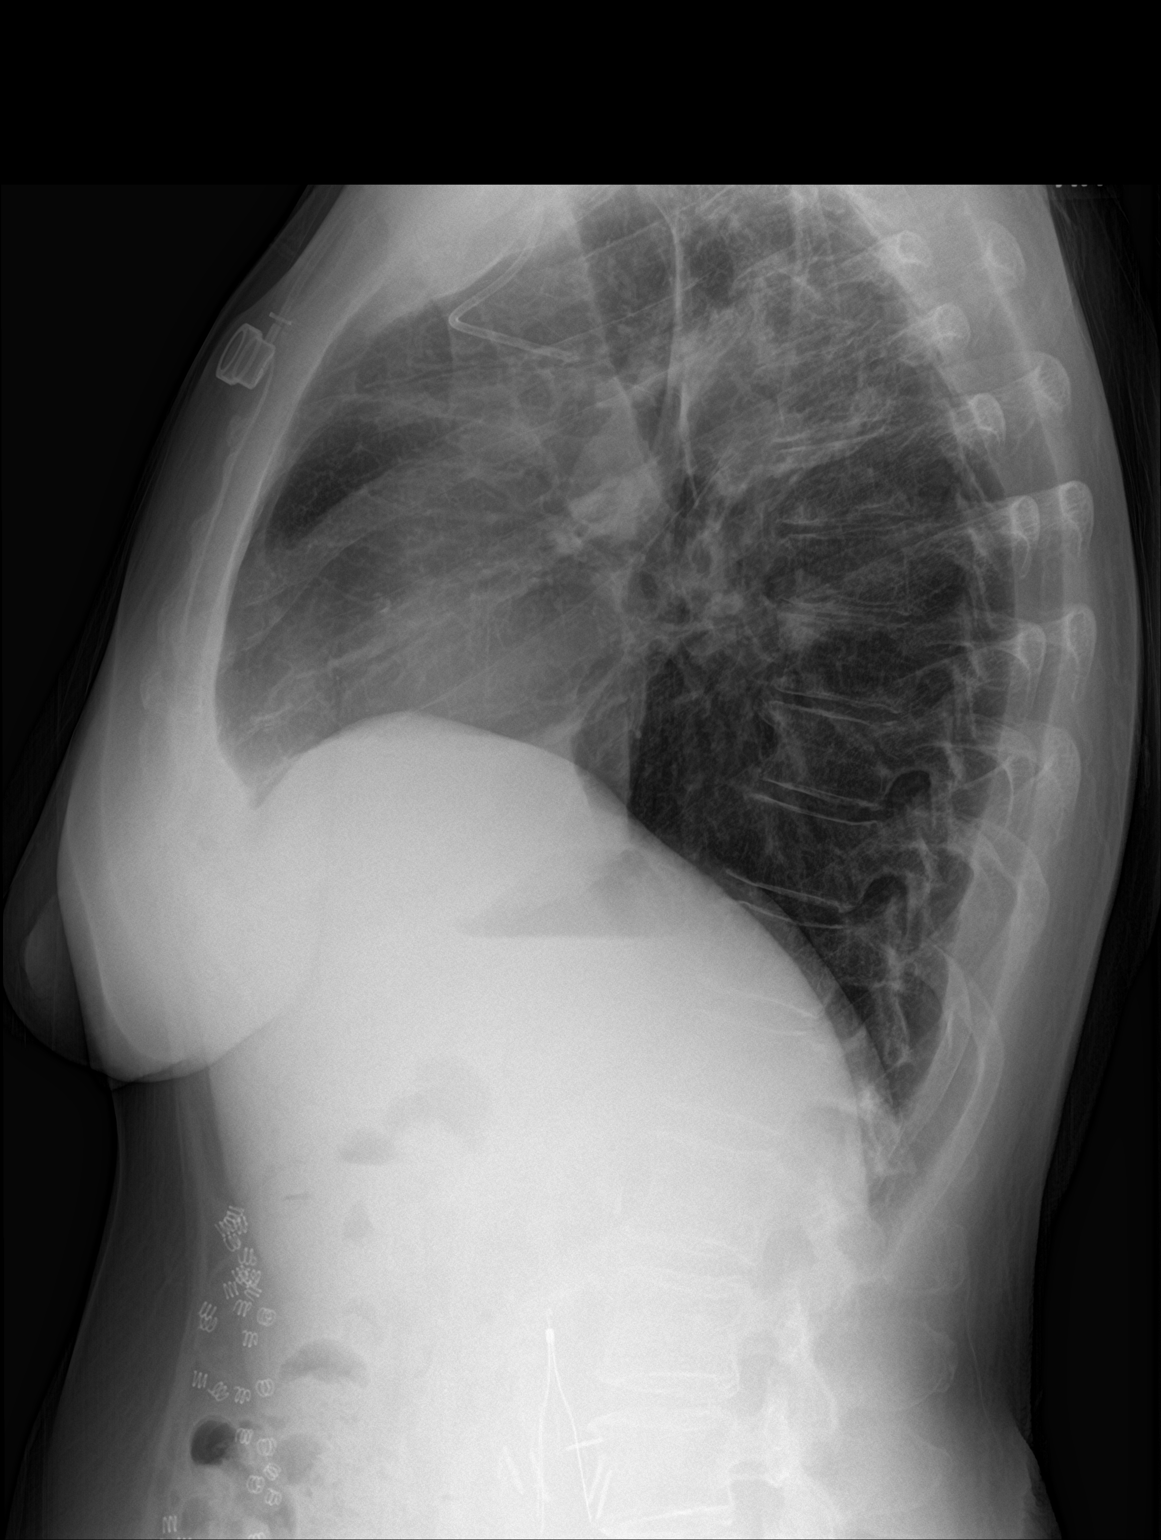

[2 of 2 positions shown; findings below may reference images not displayed]

FINDINGS: Left chest wall port with tip in the left brachycephalic vein near
the junction with the SVC. Normal heart size. Linear opacities of
the left upper lobe with associated volume loss, potentially post
treatment changes. No pleural effusion or pneumothorax.
IMPRESSION: 1. Left chest wall port with tip in the left brachycephalic vein
near the junction with the SVC.
2. Linear opacities of the left upper lobe with associated volume
loss, potentially post treatment changes. Correlate with outside
imaging.

## 2023-07-19 IMAGING — XA IR CENTRAL VENOUS CATHETER
1 series · 4 of 4 positions shown · IV contrast (omnipaque)
Comparison: none

INDICATION: 55-year-old female with history of gynecologic malignancy with
indwelling left chest wall port placed in [place] years ago. She
presents with inability to aspirate from the port.

EXAM:
FLUOROSCOPIC GUIDED PORT A CATHETER CHECK
MEDICATIONS:
None.
CONTRAST:  10 mL Omnipaque 300, intravenous
FLUOROSCOPY TIME:  Eighteen seconds (8 mGy)
COMPLICATIONS:
None immediate.
TECHNIQUE: The procedure, risks, benefits, and alternatives were explained to
the patient and informed written consent was obtained. A timeout was
performed prior to the initiation of the procedure.

[Series 1: processed: ir radiologist eval & mgmt · 4 of 15 frames shown]
[frame 3/15]
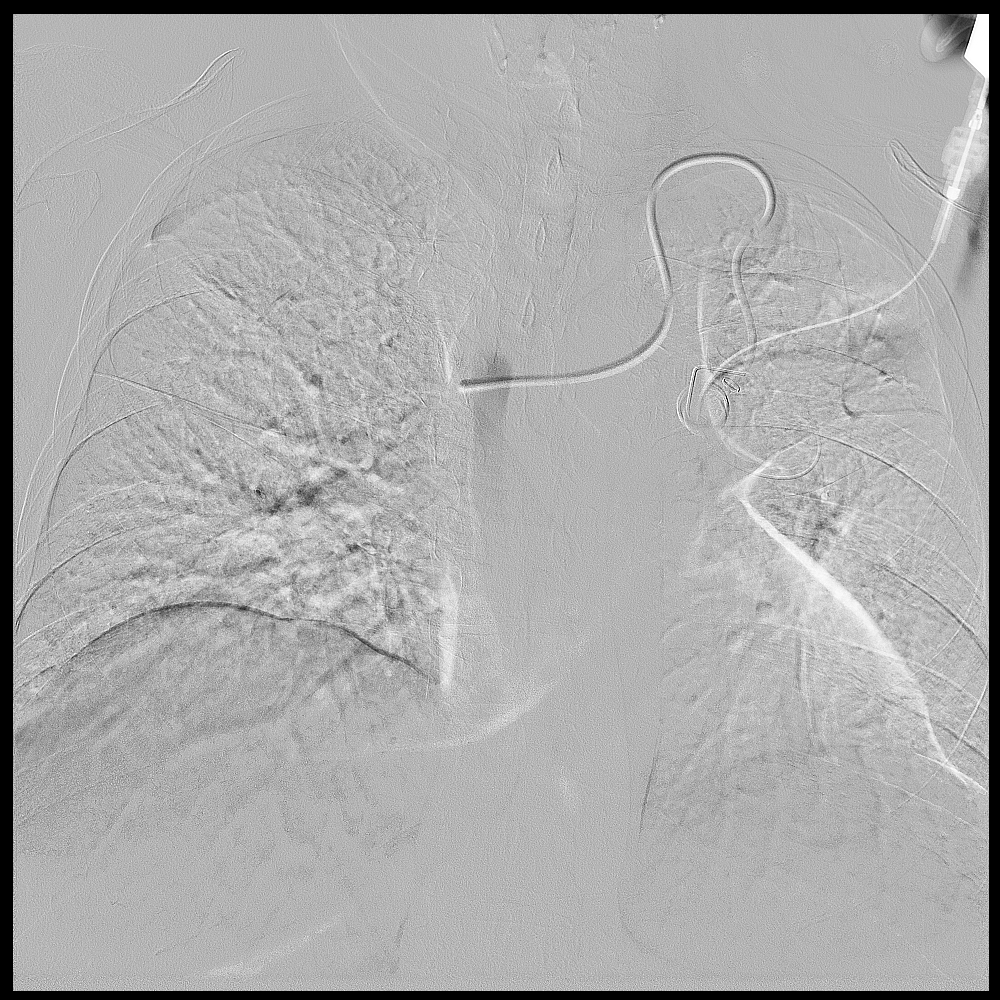
[frame 8/15]
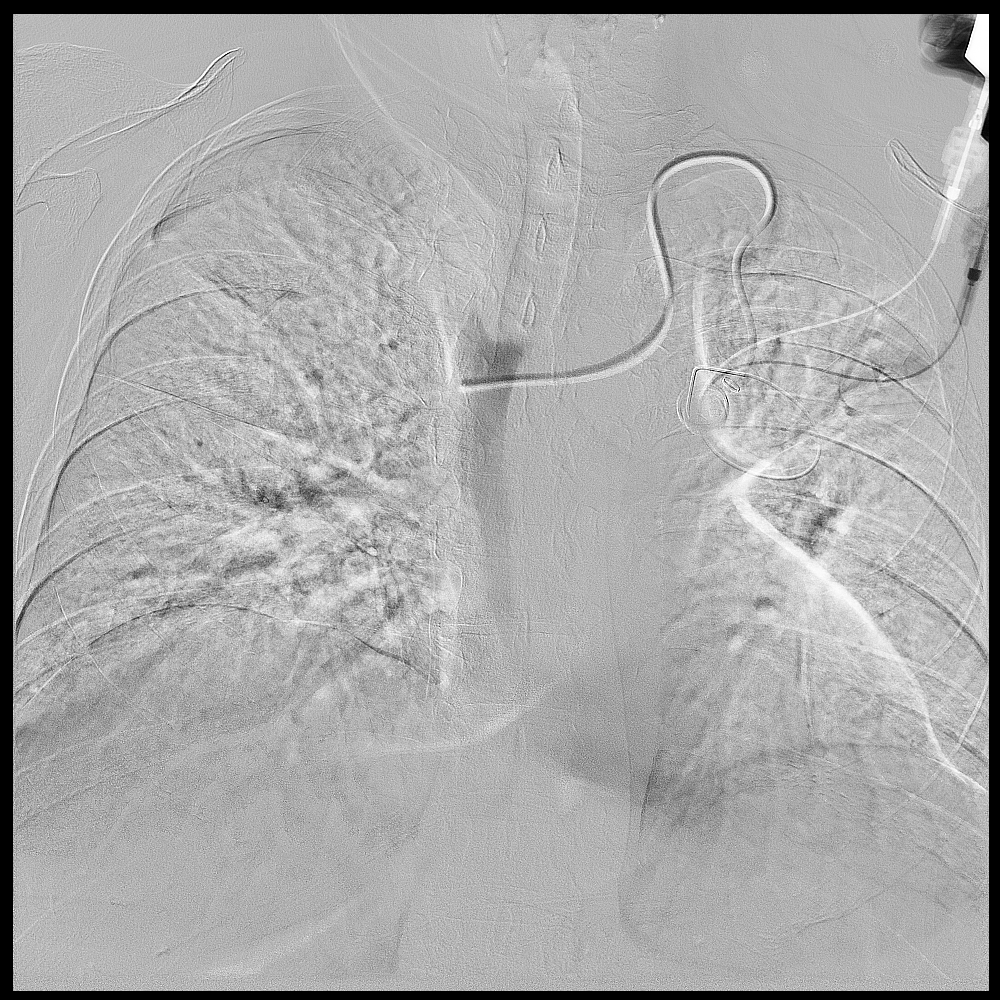
[frame 13/15]
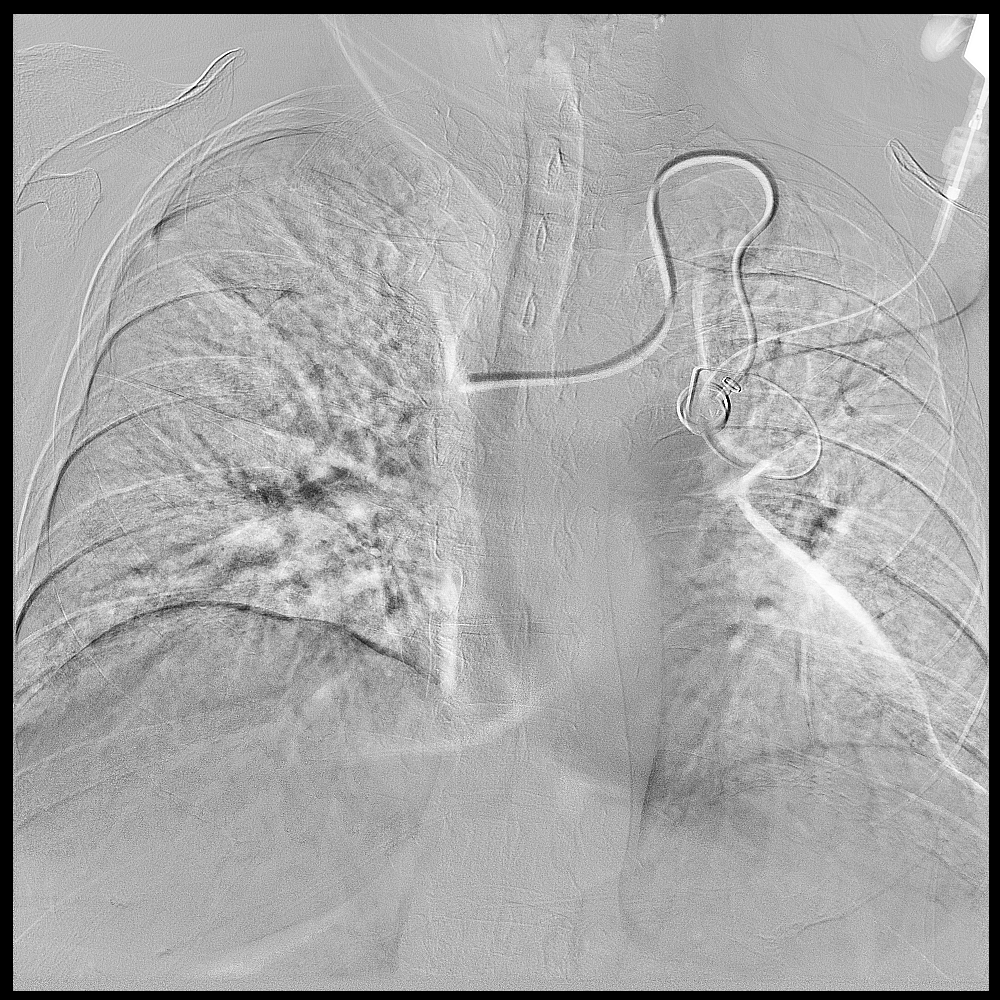
[frame 15/15]
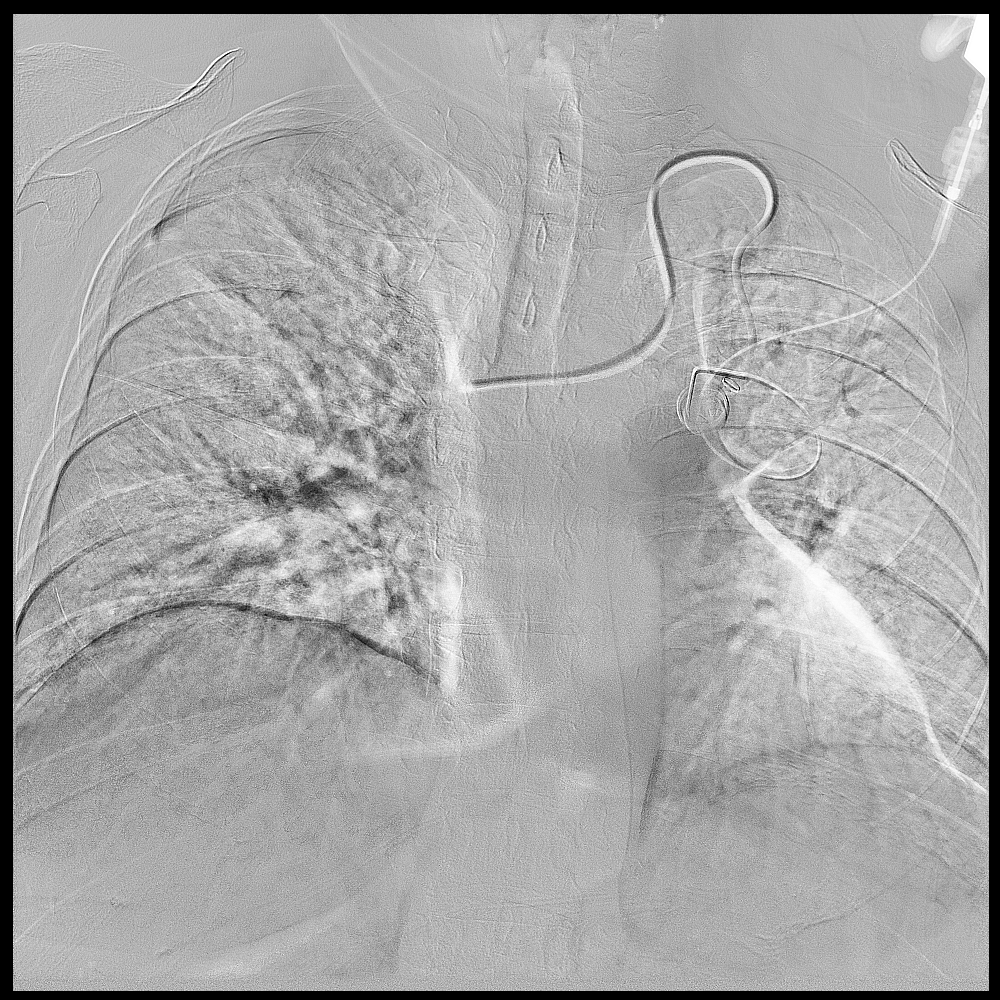

[4 of 4 positions shown; findings below may reference images not displayed]

The patient's chest port a catheter was accessed by the IR RN.

The patient was placed supine on the fluoroscopy table. A
preprocedural spot fluoroscopic image was obtained of the chest in
existing port a catheter.

Note was made of difficulty aspirating blood from the port a
catheter.

Contrast was injected via the Port a catheter and images were
reviewed.

The Port a catheter was flushed with a heparin dwell and de
accessed. A dressing was placed. The patient tolerated the procedure
well without immediate postprocedural complication.
FINDINGS: Unchanged positioning of left anterior chest wall internal jugular
vein approach port a catheter with tip projected over the expected
location of the mid aspect of the SVC.

There was difficulty aspirating blood from the port a catheter.

Contrast injection demonstrated the tip of the catheter abutting the
right lateral wall of the proximal superior vena cava. No definite
evidence of fibrin sheath or thrombus. There is no evidence of
catheter kink or fracture. No contrast extravasation.
IMPRESSION: Difficulty aspirating of the Port a catheter is secondary to
malpositioned catheter tip against the right lateral wall of the
superior vena cava.

PLAN:
After discussion of findings with the patient, the patient requests
[REDACTED]-A-Cath placement. This will be her fourth Port-A-Cath which
have been on both the right and left sides. She states history of
difficulty cannulating the right internal jugular vein hence the
current port being placed on the left. No prior cross-sectional
imaging is available upon my review at this time.

IR will arrange for [REDACTED] placement.

## 2023-10-10 IMAGING — US IR IMAGING GUIDED PORT INSERTION
1 series · 1 of 1 positions shown · non-contrast
Comparison: none

INDICATION: 55-year-old female with a history of gynecologic malignancy
(metastatic endometrial cancer) with a history of multiple bilateral
port catheter placements. Currently she has a left chest wall port
which was placed in [place] years ago. The port is nonfunctional.
Port catheter interrogation on August 23, 2021 demonstrates a left
IJ port catheter with the catheter tip directed against the lateral
wall of the SVC.

[Series 1: ir fluoro/shunt/fist · 1 of 1 slices shown]
[im 1/1]
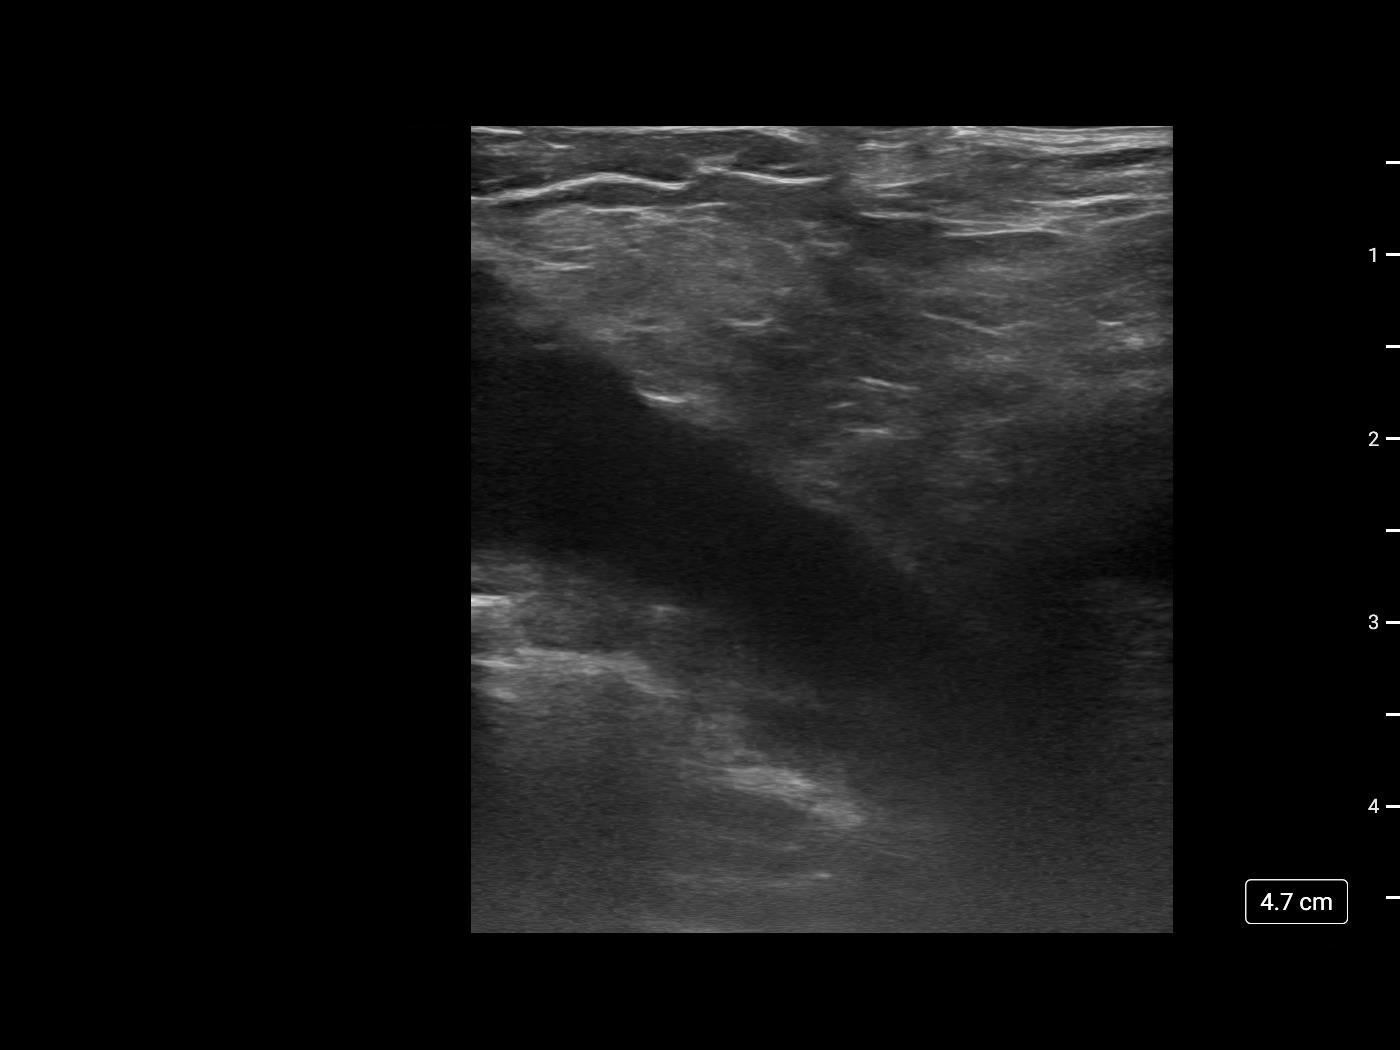

[1 of 1 positions shown; findings below may reference images not displayed]

She presents today for port catheter revision with planned placement
of a new right-sided port catheter and removal of the nonfunctioning
left port catheter.

EXAM:
IMPLANTED PORT A CATH PLACEMENT WITH ULTRASOUND AND FLUOROSCOPIC
GUIDANCE

MEDICATIONS:
None.

ANESTHESIA/SEDATION:
Versed two mg IV; Fentanyl 100 mcg IV;

Moderate Sedation Time:  28 minutes

The vital signs and level of consciousness were continuously
monitored during the procedure by the interventional radiology nurse
under my direct supervision.

FLUOROSCOPY:
Radiation exposure index: 4 mGy reference air kerma

COMPLICATIONS:
None immediate.

PROCEDURE:
The right neck and chest was prepped with chlorhexidine, and draped
in the usual sterile fashion using maximum barrier technique (cap
and mask, sterile gown, sterile gloves, large sterile sheet, hand
hygiene and cutaneous antiseptic). Local anesthesia was attained by
infiltration with 1% lidocaine with epinephrine.

Ultrasound demonstrated chronic occlusion of the distal right
internal jugular vein. The right subclavian vein is widely patent,
and this was documented with an image. Under real-time ultrasound
guidance, this vein was accessed with a 21 gauge micropuncture
needle and image documentation was performed. A small dermatotomy
was made at the access site with an 11 scalpel. A 0.018" wire was
advanced into the SVC and the access needle exchanged for a 4F
micropuncture vascular sheath. The 0.018" wire was then removed and
a 0.035" wire advanced into the IVC.





The pocket was then closed in two layers using first subdermal
inverted interrupted absorbable sutures followed by a running
subcuticular suture. The epidermis was then sealed with Dermabond.
The dermatotomy at the venous access site was also closed with
Dermabond.

Attention was turned to the left chest. Local anesthesia was
attained by infiltration with 1% lidocaine. An incision was carried
down on top of the port catheter. Using a combination of blunt and
sharp surgical dissection, the port catheter was freed from the
surrounding soft tissues and inspected and found to be intact in its
entirety. The port catheter was disposed of. The reservoir pocket
was closed in layers using interrupted 3 0 Vicryl sutures. The
epidermis was sealed with Dermabond.
IMPRESSION: Successful placement of a right subclavian approach Power Port with
ultrasound and fluoroscopic guidance. The catheter is ready for use.

Successful removal of nonfunctioning left chest port catheter.
